# Patient Record
Sex: Female | Born: 1945 | Race: White | Hispanic: No | Marital: Married
Health system: Midwestern US, Community
[De-identification: ages and names within clinical notes are randomized; demographics above are authoritative.]

## PROBLEM LIST (undated history)

## (undated) DIAGNOSIS — Z131 Encounter for screening for diabetes mellitus: Principal | ICD-10-CM

## (undated) DIAGNOSIS — E782 Mixed hyperlipidemia: Secondary | ICD-10-CM

## (undated) DIAGNOSIS — D6859 Other primary thrombophilia: Secondary | ICD-10-CM

## (undated) DIAGNOSIS — E039 Hypothyroidism, unspecified: Secondary | ICD-10-CM

## (undated) DIAGNOSIS — R221 Localized swelling, mass and lump, neck: Principal | ICD-10-CM

## (undated) DIAGNOSIS — M810 Age-related osteoporosis without current pathological fracture: Secondary | ICD-10-CM

## (undated) DIAGNOSIS — Z1231 Encounter for screening mammogram for malignant neoplasm of breast: Secondary | ICD-10-CM

## (undated) DIAGNOSIS — R799 Abnormal finding of blood chemistry, unspecified: Secondary | ICD-10-CM

## (undated) DIAGNOSIS — R7301 Impaired fasting glucose: Secondary | ICD-10-CM

## (undated) DIAGNOSIS — Z1211 Encounter for screening for malignant neoplasm of colon: Secondary | ICD-10-CM

## (undated) DIAGNOSIS — Z9109 Other allergy status, other than to drugs and biological substances: Secondary | ICD-10-CM

## (undated) DIAGNOSIS — Z7901 Long term (current) use of anticoagulants: Secondary | ICD-10-CM

## (undated) DIAGNOSIS — Z86718 Personal history of other venous thrombosis and embolism: Secondary | ICD-10-CM

## (undated) DIAGNOSIS — D689 Coagulation defect, unspecified: Secondary | ICD-10-CM

## (undated) DIAGNOSIS — I824Y9 Acute embolism and thrombosis of unspecified deep veins of unspecified proximal lower extremity: Secondary | ICD-10-CM

## (undated) DIAGNOSIS — R7989 Other specified abnormal findings of blood chemistry: Secondary | ICD-10-CM

## (undated) DIAGNOSIS — M8588 Other specified disorders of bone density and structure, other site: Secondary | ICD-10-CM

## (undated) DIAGNOSIS — E785 Hyperlipidemia, unspecified: Secondary | ICD-10-CM

## (undated) DIAGNOSIS — Q782 Osteopetrosis: Secondary | ICD-10-CM

## (undated) DIAGNOSIS — Z79899 Other long term (current) drug therapy: Secondary | ICD-10-CM

## (undated) DIAGNOSIS — I82409 Acute embolism and thrombosis of unspecified deep veins of unspecified lower extremity: Secondary | ICD-10-CM

## (undated) DIAGNOSIS — M1990 Unspecified osteoarthritis, unspecified site: Secondary | ICD-10-CM

## (undated) DIAGNOSIS — K589 Irritable bowel syndrome without diarrhea: Secondary | ICD-10-CM

## (undated) DIAGNOSIS — E119 Type 2 diabetes mellitus without complications: Secondary | ICD-10-CM

## (undated) DIAGNOSIS — C801 Malignant (primary) neoplasm, unspecified: Secondary | ICD-10-CM

## (undated) DIAGNOSIS — I10 Essential (primary) hypertension: Secondary | ICD-10-CM

## (undated) HISTORY — PX: ABDOMINAL HYSTERECTOMY: SHX81

## (undated) HISTORY — PX: CHOLECYSTECTOMY: SHX55

## (undated) HISTORY — PX: KNEE SURGERY: SHX244

---

## 2005-07-28 ENCOUNTER — Ambulatory Visit: Payer: Self-pay | Admitting: Internal Medicine

## 2005-08-11 ENCOUNTER — Ambulatory Visit: Payer: Self-pay | Admitting: Gastroenterology

## 2005-08-25 ENCOUNTER — Ambulatory Visit: Payer: Self-pay | Admitting: Gastroenterology

## 2006-08-07 ENCOUNTER — Ambulatory Visit: Payer: Self-pay | Admitting: Internal Medicine

## 2007-08-10 ENCOUNTER — Ambulatory Visit: Payer: Self-pay | Admitting: Internal Medicine

## 2008-03-01 ENCOUNTER — Ambulatory Visit: Payer: Self-pay

## 2008-03-20 ENCOUNTER — Ambulatory Visit: Payer: Self-pay | Admitting: Orthopaedic Surgery

## 2008-09-03 ENCOUNTER — Ambulatory Visit: Payer: Self-pay | Admitting: Unknown Physician Specialty

## 2008-10-07 ENCOUNTER — Ambulatory Visit: Payer: Self-pay | Admitting: Unknown Physician Specialty

## 2009-09-04 ENCOUNTER — Ambulatory Visit: Payer: Self-pay | Admitting: Internal Medicine

## 2010-09-20 ENCOUNTER — Ambulatory Visit: Payer: Self-pay | Admitting: Internal Medicine

## 2010-10-05 ENCOUNTER — Ambulatory Visit: Payer: Self-pay | Admitting: Internal Medicine

## 2011-03-21 ENCOUNTER — Ambulatory Visit: Payer: Self-pay | Admitting: Internal Medicine

## 2012-03-27 ENCOUNTER — Ambulatory Visit: Payer: Self-pay | Admitting: Internal Medicine

## 2012-08-28 LAB — HM MAMMOGRAPHY: Mammography, External: NORMAL

## 2012-12-17 ENCOUNTER — Encounter

## 2012-12-19 LAB — PROTHROMBIN TIME + INR
INR: 3.3 — ABNORMAL HIGH (ref 0.8–1.2)
Prothrombin time: 34.6 s — ABNORMAL HIGH (ref 9.1–12.0)

## 2012-12-19 NOTE — Telephone Encounter (Signed)
Pt takes 2.5mg  on Wednesdays and Thursday and 5 mg all other days. Pt is aware of results and made apt

## 2012-12-19 NOTE — Telephone Encounter (Signed)
Message copied by Barnett Hatter on Wed Dec 19, 2012  4:42 PM  ------       Message from: Junius Roads L       Created: Wed Dec 19, 2012  4:40 PM         Ask this pt how much coumadin she is currently taking.  Continue current dose and recheck in 2 weeks.  ------

## 2012-12-19 NOTE — Progress Notes (Signed)
Quick Note:    Ask this pt how much coumadin she is currently taking. Continue current dose and recheck in 2 weeks.  ______

## 2012-12-31 ENCOUNTER — Encounter

## 2013-01-02 LAB — PROTHROMBIN TIME + INR
INR: 2.4 — ABNORMAL HIGH (ref 0.8–1.2)
Prothrombin time: 25.4 s — ABNORMAL HIGH (ref 9.1–12.0)

## 2013-01-02 NOTE — Progress Notes (Signed)
Quick Note:    Continue current dose and recheck in 2 weeks. (what is her current dose?)  ______

## 2013-01-03 NOTE — Progress Notes (Signed)
Quick Note:    Takes 5 mg except Wednesday and Thursday takes 2.5mg . Pt is aware  ______

## 2013-01-12 ENCOUNTER — Emergency Department: Payer: Self-pay | Admitting: Emergency Medicine

## 2013-01-12 LAB — BASIC METABOLIC PANEL
Anion Gap: 4 — ABNORMAL LOW (ref 7–16)
BUN: 13 mg/dL (ref 7–18)
Co2: 30 mmol/L (ref 21–32)
Creatinine: 0.56 mg/dL — ABNORMAL LOW (ref 0.60–1.30)
EGFR (African American): >60
EGFR (Non-African Amer.): >60
Osmolality: 279 (ref 275–301)
Potassium: 3.8 mmol/L (ref 3.5–5.1)

## 2013-01-12 LAB — CK TOTAL AND CKMB (NOT AT ARMC)
CK, Total: 63 U/L (ref 21–215)
CK-MB: <0.5 ng/mL — ABNORMAL LOW (ref 0.5–3.6)

## 2013-01-12 LAB — CBC
HCT: 42.4 % (ref 35.0–47.0)
HGB: 14.1 g/dL (ref 12.0–16.0)
MCHC: 33.2 g/dL (ref 32.0–36.0)
MCV: 92 fL (ref 80–100)
Platelet: 186 10*3/uL (ref 150–440)
WBC: 4.7 10*3/uL (ref 3.6–11.0)

## 2013-01-14 ENCOUNTER — Encounter

## 2013-01-16 LAB — PROTHROMBIN TIME + INR
INR: 2.4 — ABNORMAL HIGH (ref 0.8–1.2)
Prothrombin time: 25.2 s — ABNORMAL HIGH (ref 9.1–12.0)

## 2013-01-16 NOTE — Progress Notes (Signed)
Quick Note:    inr is 2.4. Recheck in 1 month.  ______

## 2013-01-17 NOTE — Progress Notes (Signed)
Quick Note:    Pt is aware and apt made  ______

## 2013-01-17 NOTE — Progress Notes (Signed)
Quick Note:    Pt aware and apt made  ______

## 2013-01-29 ENCOUNTER — Encounter

## 2013-02-12 ENCOUNTER — Encounter

## 2013-02-13 LAB — PT/INR EXTERNAL: INR, External: 2.8 (ref 2–3)

## 2013-02-14 LAB — PROTHROMBIN TIME + INR
INR: 2.8 — ABNORMAL HIGH (ref 0.8–1.2)
Prothrombin time: 29.8 s — ABNORMAL HIGH (ref 9.1–12.0)

## 2013-02-14 NOTE — Progress Notes (Signed)
Pt is aware to continue same dose and recheck in 1 month

## 2013-02-14 NOTE — Progress Notes (Signed)
Quick Note:    Pt aware to continue same dosage and recheck in a month  ______

## 2013-03-11 ENCOUNTER — Encounter

## 2013-03-19 LAB — PROTHROMBIN TIME + INR
INR: 1.6 — ABNORMAL HIGH (ref 0.8–1.2)
Prothrombin time: 17.4 s — ABNORMAL HIGH (ref 9.1–12.0)

## 2013-03-22 NOTE — Progress Notes (Signed)
Quick Note:    Pt aware to take 2.5 mg on Wed and 5mg  all other days recheck in 2 weeks  ______

## 2013-03-29 ENCOUNTER — Ambulatory Visit: Payer: Self-pay | Admitting: Internal Medicine

## 2013-03-29 ENCOUNTER — Encounter

## 2013-04-02 ENCOUNTER — Encounter

## 2013-04-02 LAB — PROTHROMBIN TIME + INR
INR: 2.2 — ABNORMAL HIGH (ref 0.8–1.2)
Prothrombin time: 23.2 s — ABNORMAL HIGH (ref 9.1–12.0)

## 2013-04-02 NOTE — Progress Notes (Signed)
Quick Note:    Pt aware  ______

## 2013-04-02 NOTE — Progress Notes (Signed)
Quick Note:    INR is good at 2.2. Recheck in 1 month.  ______

## 2013-04-02 NOTE — Progress Notes (Signed)
Quick Note:    No answer  ______

## 2013-04-30 LAB — PROTHROMBIN TIME + INR
INR: 3.7 — ABNORMAL HIGH (ref 0.8–1.2)
Prothrombin time: 38.1 s — ABNORMAL HIGH (ref 9.1–12.0)

## 2013-04-30 NOTE — Progress Notes (Signed)
Quick Note:    Pt aware and apt is aware  ______

## 2013-04-30 NOTE — Progress Notes (Signed)
Quick Note:    INR is high at 3.7. Hold Coumadin for 2 days then restart regular dose and recheck in 2 weeks.  ______

## 2013-04-30 NOTE — Progress Notes (Signed)
Quick Note:    No additional comment  ______

## 2013-05-02 ENCOUNTER — Encounter

## 2013-05-09 ENCOUNTER — Encounter

## 2013-05-09 NOTE — Progress Notes (Signed)
[facility]  Nai Borromeo L. Britta Mccreedy, M.D.  [address]  [place], [place] [address]  Ph No:  [phone]  Fax:  [phone]    CHIEF COMPLAINT:  Chief Complaint   Patient presents with   ??? Blood Clot   ??? Cholesterol Problem   ??? Hypertension          HISTORY OF PRESENT ILLNESS:  Tonya Shaffer is a 67 y.o. female presents today for follow-up and refills of her medications.  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  She is anxious to come off of the medication.  She has difficulty maintaining a therapeutic level.  All of her clots have occurred after surgical procedures.  She has had 3 in the past.  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  She has no current symptoms of blood clot.  She exercises regularly.  She denies chest pain or shortness of breath.  She denies leg pain.  She does have a history of asthma and uses her inhalers as needed.  She has rare occasions to use medication.  She does not smoke.  She also has elevated cholesterol and is currently on Lipitor.  No side effects of his medications.  Her levels have not been checked in several months.  She has history of osteoporosis.  She takes Fosamax once a week and has done this for over 2 years.  She has not had a follow-up bone density test.  She otherwise has no other complaints today.      HISTORY:  Allergies   Allergen Reactions   ??? Codeine Unable to Obtain     Past Medical History   Diagnosis Date   ??? Allergic rhinitis, cause unspecified 11/27/2012   ??? Contact dermatitis and other eczema, due to unspecified cause 11/27/2012   ??? Long term (current) use of anticoagulants 11/27/2012   ??? Encounter for long-term (current) use of other medications 11/27/2012   ??? Nonspecific abnormal electrocardiogram (ECG) (EKG) 11/27/2012   ??? 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 Hx gyn       Removed tubes   ??? Pr  abdomen surgery proc unlisted     ??? Hx cholecystectomy     ??? Pr vascular surgery procedure unlist       Left leg striped the vein     Family History   Problem Relation Age of Onset   ??? Diabetes Mother    ??? Cancer Father    ??? Asthma Father    ??? Heart Disease Father    ??? Diabetes Sister    ??? Cancer Brother      History     Social History   ??? Marital Status: MARRIED     Spouse Name: N/A     Number of Children: N/A   ??? Years of Education: N/A     Occupational History   ??? Not on file.     Social History Main Topics   ??? Smoking status: Never Smoker    ??? Smokeless tobacco: Never Used   ??? Alcohol Use: No   ??? Drug Use: No   ??? Sexually Active: Not on file     Other Topics Concern   ??? Not on file     Social History Narrative   ??? No narrative on file  Current Outpatient Prescriptions   Medication Sig Dispense Refill   ??? nitroglycerin (NITROSTAT) 0.4 mg SL tablet by SubLINGual route every five (5) minutes as needed for Chest Pain.       ??? warfarin (COUMADIN) 5 mg tablet Take 1 Tab by mouth daily.  30 Tab  5   ??? atorvastatin (LIPITOR) 40 mg tablet Take 1 Tab by mouth daily.  30 Tab  5   ??? [START ON 05/13/2013] alendronate (FOSAMAX) 70 mg tablet Take 1 Tab by mouth every Monday.  4 Tab  5   ??? montelukast (SINGULAIR) 10 mg tablet Take 1 Tab by mouth daily.  1 Tab  5   ??? albuterol (PROVENTIL HFA, VENTOLIN HFA) 90 mcg/actuation inhaler Take 2 Puffs by inhalation every four (4) hours as needed for Wheezing (FOR SOB).  1 Inhaler  5   ??? calcium-vitamin D (OYSTER SHELL CALCIUM-VIT D3) 250-125 mg-unit tablet Take 1 Tab by mouth daily.             REVIEW OF SYSTEMS:  Review of systems is as indicated in HPI otherwise negative.    PHYSICAL EXAM:  Vital Signs - BP 129/78   Pulse 75   Temp(Src) 98 ??F (36.7 ??C) (Oral)   Ht 5' 0.5" (1.537 m)   Wt 141 lb (63.957 kg)   BMI 27.07 kg/m2   SpO2 98%   Physical Exam   Constitutional: She is oriented to person, place, and time. She appears well-developed and well-nourished.   HENT:   Head:  Normocephalic and atraumatic.   Right Ear: External ear normal.   Left Ear: External ear normal.   Nose: Nose normal.   Mouth/Throat: Oropharynx is clear and moist.   Eyes: Conjunctivae and EOM are normal. Pupils are equal, round, and reactive to light.   Neck: Normal range of motion. Neck supple.   Cardiovascular: Normal rate, regular rhythm, normal heart sounds and intact distal pulses.    Pulmonary/Chest: Effort normal and breath sounds normal.   Abdominal: Soft. Bowel sounds are normal.   Musculoskeletal: Normal range of motion.   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  Otherwise full range of motion.   Neurological: She is alert and oriented to person, place, and time.   Skin: Skin is warm and dry.   Psychiatric: She has a normal mood and affect. Her behavior is normal. Judgment and thought content normal.              LABS  Results for orders placed in visit on 04/29/13   PROTHROMBIN TIME + INR       Result Value Range    INR 3.7 (*) 0.8 - 1.2    Prothrombin time 38.1 (*) 9.1 - 12.0 sec           IMPRESSION/PLAN      ICD-9-CM    1. Other and unspecified hyperlipidemia 272.4 nitroglycerin (NITROSTAT) 0.4 mg SL tablet     CBC WITH AUTOMATED DIFF     METABOLIC PANEL, COMPREHENSIVE     LIPID PANEL WITH LDL/HDL RATIO     THYROID CASCADE PROFILE   2. Encounter for long-term (current) use of other medications V58.69 nitroglycerin (NITROSTAT) 0.4 mg SL tablet     CBC WITH AUTOMATED DIFF     METABOLIC PANEL, COMPREHENSIVE     LIPID PANEL WITH LDL/HDL RATIO     THYROID CASCADE PROFILE   3. Long term (current) use of anticoagulants V58.61 nitroglycerin (NITROSTAT) 0.4 mg SL tablet  CBC WITH AUTOMATED DIFF     METABOLIC PANEL, COMPREHENSIVE     LIPID PANEL WITH LDL/HDL RATIO     THYROID CASCADE PROFILE   4. 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 Environmental allergies V15.09    6. Osteoporosis 733.00 DEXA BONE DENSITY STUDY AXIAL       We will reassess her  clotting factors.  Also consider referral to hematology.  I did advise that we may be able to discontinue Coumadin and perhaps switch her to xarelto.  Refer to hematology depending on her lab tests.  Also we will send her for follow-up bone density test.  She did discuss possibly starting infusion therapy rather than continued use of Fosamax.  Follow-up depending on her bone density test.  Otherwise she will follow-up here in 3 months.  We will check her labs today.    Jamir Rone Atilano Ina, MD            Dictated using voice recognition software. Proofread, but unrecognized voice recognition errors may exist.

## 2013-05-09 NOTE — Addendum Note (Signed)
Addended by: Evelena Leyden on: 05/09/2013 01:36 PM     Modules accepted: Orders

## 2013-05-14 ENCOUNTER — Encounter

## 2013-05-17 LAB — METABOLIC PANEL, COMPREHENSIVE
A-G Ratio: 1.9 (ref 1.1–2.5)
ALT (SGPT): 6 IU/L (ref 0–32)
AST (SGOT): 10 IU/L (ref 0–40)
Albumin: 4.5 g/dL (ref 3.6–4.8)
Alk. phosphatase: 44 IU/L (ref 39–117)
BUN/Creatinine ratio: 11 (ref 11–26)
BUN: 9 mg/dL (ref 8–27)
Bilirubin, total: 0.3 mg/dL (ref 0.0–1.2)
CO2: 22 mmol/L (ref 18–29)
Calcium: 9.5 mg/dL (ref 8.6–10.2)
Chloride: 102 mmol/L (ref 97–108)
Creatinine: 0.8 mg/dL (ref 0.57–1.00)
GFR est AA: 88 mL/min/{1.73_m2} (ref >59)
GFR est non-AA: 77 mL/min/{1.73_m2} (ref >59)
GLOBULIN, TOTAL: 2.4 g/dL (ref 1.5–4.5)
Glucose: 83 mg/dL (ref 65–99)
Potassium: 4.8 mmol/L (ref 3.5–5.2)
Protein, total: 6.9 g/dL (ref 6.0–8.5)
Sodium: 142 mmol/L (ref 134–144)

## 2013-05-17 LAB — PROTHROMBIN TIME + INR
INR: 1.8 — ABNORMAL HIGH (ref 0.8–1.2)
Prothrombin time: 18.8 s — ABNORMAL HIGH (ref 9.1–12.0)

## 2013-05-17 LAB — CBC WITH AUTOMATED DIFF
ABS. BASOPHILS: 0.1 10*3/uL (ref 0.0–0.2)
ABS. EOSINOPHILS: 0.1 10*3/uL (ref 0.0–0.4)
ABS. IMM. GRANS.: 0 10*3/uL (ref 0.0–0.1)
ABS. MONOCYTES: 0.3 10*3/uL (ref 0.1–0.9)
ABS. NEUTROPHILS: 0.7 10*3/uL — ABNORMAL LOW (ref 1.4–7.0)
Abs Lymphocytes: 1.6 10*3/uL (ref 0.7–3.1)
BASOPHILS: 3 % (ref 0–3)
EOSINOPHILS: 2 % (ref 0–5)
HCT: 39.4 % (ref 34.0–46.6)
HGB: 13.3 g/dL (ref 11.1–15.9)
IMMATURE GRANULOCYTES: 0 % (ref 0–2)
Lymphocytes: 58 % — ABNORMAL HIGH (ref 14–46)
MCH: 31 pg (ref 26.6–33.0)
MCHC: 33.8 g/dL (ref 31.5–35.7)
MCV: 92 fL (ref 79–97)
MONOCYTES: 11 % (ref 4–12)
NEUTROPHILS: 26 % — ABNORMAL LOW (ref 40–74)
PLATELET: 320 10*3/uL (ref 150–379)
RBC: 4.29 x10E6/uL (ref 3.77–5.28)
RDW: 13.8 % (ref 12.3–15.4)
WBC: 2.8 10*3/uL — ABNORMAL LOW (ref 3.4–10.8)

## 2013-05-17 LAB — LIPID PANEL WITH LDL/HDL RATIO
Cholesterol, total: 232 mg/dL — ABNORMAL HIGH (ref 100–199)
HDL Cholesterol: 72 mg/dL (ref >39)
LDL, calculated: 142 mg/dL — ABNORMAL HIGH (ref 0–99)
LDL/HDL Ratio: 2 ratio units (ref 0.0–3.2)
Triglyceride: 88 mg/dL (ref 0–149)
VLDL, calculated: 18 mg/dL (ref 5–40)

## 2013-05-17 LAB — CRP, HIGH SENSITIVITY: C-Reactive Protein, Cardiac: 1.57 mg/L (ref 0.00–3.00)

## 2013-05-17 LAB — THYROID CASCADE PROFILE: TSH: 3.1 u[IU]/mL (ref 0.450–4.500)

## 2013-05-20 ENCOUNTER — Encounter

## 2013-05-22 ENCOUNTER — Encounter

## 2013-05-22 NOTE — Progress Notes (Signed)
Quick Note:    Pt aware  ______

## 2013-05-22 NOTE — Progress Notes (Signed)
Quick Note:    Her white blood cell count is little low at 2.8. Recheck this in 2 weeks. We are still waiting on her protein C and protein S level. Her cholesterol is slightly elevated but her HDL cholesterol is good at 72. Recheck this and 6 months. Her INR is 1.8. Recheck this in 2 weeks.  ______

## 2013-05-28 LAB — PROTEIN S AG,FREE + TOTAL
Protein S, Free: 58 % (ref 56–124)
Protein S, Total: 64 % (ref 58–150)

## 2013-05-28 LAB — PROTEIN C, TOTAL: Protein C Antigen: 62 % — ABNORMAL LOW (ref 70–140)

## 2013-05-29 NOTE — Progress Notes (Signed)
Quick Note:    Informed patient also that her bone mass density test is positive for osteoporosis. Her T score is -2.6 and it should be greater than -2.5. She should continue Fosamax and calcium and vitamin D.  ______

## 2013-05-29 NOTE — Progress Notes (Signed)
Quick Note:    Pt aware  ______

## 2013-05-29 NOTE — Progress Notes (Signed)
Quick Note:    Pt is aware and we will recheck her INR next week before we start her on new medication  ______

## 2013-05-29 NOTE — Progress Notes (Signed)
Quick Note:    Pt aware. Wishes to start new medication  ______

## 2013-05-29 NOTE — Progress Notes (Signed)
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 If she wishes, we can discontinue Coumadin and start her on Xarelto and she would no longer need to monitor her PT and INR.  ______

## 2013-06-06 ENCOUNTER — Encounter

## 2013-06-06 LAB — CBC WITH AUTOMATED DIFF
ABS. BASOPHILS: 0.1 10*3/uL (ref 0.0–0.2)
ABS. EOSINOPHILS: 0.1 10*3/uL (ref 0.0–0.4)
ABS. IMM. GRANS.: 0 10*3/uL (ref 0.0–0.1)
ABS. MONOCYTES: 0.3 10*3/uL (ref 0.1–0.9)
ABS. NEUTROPHILS: 1 10*3/uL — ABNORMAL LOW (ref 1.4–7.0)
Abs Lymphocytes: 1.7 10*3/uL (ref 0.7–3.1)
BASOPHILS: 2 % (ref 0–3)
EOSINOPHILS: 3 % (ref 0–5)
HCT: 38.7 % (ref 34.0–46.6)
HGB: 13.1 g/dL (ref 11.1–15.9)
IMMATURE GRANULOCYTES: 0 % (ref 0–2)
Lymphocytes: 53 % — ABNORMAL HIGH (ref 14–46)
MCH: 31.4 pg (ref 26.6–33.0)
MCHC: 33.9 g/dL (ref 31.5–35.7)
MCV: 93 fL (ref 79–97)
MONOCYTES: 9 % (ref 4–12)
NEUTROPHILS: 33 % — ABNORMAL LOW (ref 40–74)
PLATELET: 339 10*3/uL (ref 150–379)
RBC: 4.17 x10E6/uL (ref 3.77–5.28)
RDW: 13.9 % (ref 12.3–15.4)
WBC: 3.1 10*3/uL — ABNORMAL LOW (ref 3.4–10.8)

## 2013-06-06 LAB — PROTHROMBIN TIME + INR
INR: 1.3 — ABNORMAL HIGH (ref 0.8–1.2)
Prothrombin time: 13.8 s — ABNORMAL HIGH (ref 9.1–12.0)

## 2013-06-06 NOTE — Progress Notes (Signed)
Quick Note:    Pt was taking 5 mg every day except Wed and Thursday she was not taking anything. Increased to 5 mg every day except Thursday and will recheck in 1 week  ______

## 2013-06-06 NOTE — Progress Notes (Signed)
Quick Note:    Her INR is 1.3. What is her current dose of Coumadin? It needs to be increased by 5 mg per week and recheck in 1 week. Once INR is between 2 and 3, switch to Xarelto.  ______

## 2013-06-12 ENCOUNTER — Encounter

## 2013-06-14 LAB — PROTHROMBIN TIME + INR
INR: 1.8 — ABNORMAL HIGH (ref 0.8–1.2)
Prothrombin time: 19 s — ABNORMAL HIGH (ref 9.1–12.0)

## 2013-06-17 ENCOUNTER — Encounter

## 2013-06-17 NOTE — Progress Notes (Signed)
Quick Note:    Pt aware and increased to 5 mg everyday reck on 8/4  ______

## 2013-06-17 NOTE — Progress Notes (Signed)
Quick Note:    INR is 1.8 increased Coumadin by 5 milligrams per week and recheck in 1 week.  ______

## 2013-06-18 NOTE — Progress Notes (Signed)
Ambulatory/Rehab Services H2 Model Falls Risk Assessment    Risk Factor Pts. ??   Confusion/Disorientation/Impulsivity  []    4 ??   Symptomatic Depression  []   2 ??   Altered Elimination  []   1 ??   Dizziness/Vertigo  []   1 ??   Gender (Female)  []   1 ??   Any administered antiepileptics (anticonvulsants):  []   2 ??   Any administered benzodiazepines:  []   1 ??   Visual Impairment (specify):  []   1 ??   Portable Oxygen Use  []   1 ??   Orthostatic ? BP  []   1 ??   History of Recent Falls (within 3 mos.)  []   5     Ability to Rise from Chair (choose one) Pts. ??   Ability to rise in a single movement  [x]   0 ??   Pushes up, successful in one attempt  []   1 ??   Multiple attempts, but successful  []   3 ??   Unable to rise without assistance  []   4   Total: (5 or greater = High Risk) 0     Falls Prevention Plan:   []                Physical Limitations to Exercise (specify):   []                Mobility Assistance Device (type):   []                Exercise/Equipment Adaptation (specify):    ??2010 [employer] All Rights Reserved. [place] Patent #7,282,031. Federal Law prohibits the replication, distribution or use without written permission from [employer]

## 2013-06-18 NOTE — Progress Notes (Addendum)
Therapy Center at [facility] [address]  [address] [place], [place] [address]  Phone: [phone]   Fax: [phone]  Outpatient PHYSICAL THERAPY: Initial Assessment and Discharge  Fall Risk Score: 0 (? 5 = High Risk)    Treatment Diagnosis: Other disorders of muscle ligament and fascia  REFERRING PHYSICIAN: Barksdale, Thom Chimes*  MD Orders: Bone safety evaluation  Return Physician Appointment: unknown   MEDICAL/REFERRING DIAGNOSIS: Osteoporosis [733.00]  Other physical therapy [V57.1]  DATE OF ONSET: 05/14/2013   PRIOR LEVEL OF FUNCTION: Independent   PRECAUTIONS/ALLERGIES: Allergic to codeine   ASSESSMENT:  ????????This section established at most recent assessment??????????  PROBLEM LIST (Impairments causing functional limitations):  1. No problems stated  GOALS: (Goals have been discussed and agreed upon with patient.)  SHORT-TERM FUNCTIONAL GOALS: Time Frame:   1. No short term goals stated  DISCHARGE GOALS: Time Frame:   1. No discharge goals stated   PLAN OF CARE:  INTERVENTIONS PLANNED: (Benefits and precautions of physical therapy have been discussed with the patient.)  1. No interventions stated  TREATMENT PLAN EFFECTIVE DATES: 06/18/2013  FREQUENCY/DURATION: One time appointment for bone safety evaluation, home exercise program, and education.  Patient discharged from physical therapy.  Regarding Tonya Shaffer's therapy, I certify that the treatment plan above will be carried out by a therapist or under their direction.  Thank you for this referral,  Gregorio Worley Gevena Barre, PT     Referring Physician Signature: Domingo Cocking*          Date                       SUBJECTIVE:  History of Present Injury/Illness (Reason for Referral): Patient referred for bone safety evaluation due to osteoporosis  Present Symptoms: N/A   Pain Intensity 1: 0  Dominant Side:   Past Medical History:   Past Medical History   Diagnosis Date   ??? Allergic rhinitis, cause  unspecified 11/27/2012   ??? Contact dermatitis and other eczema, due to unspecified cause 11/27/2012   ??? Long term (current) use of anticoagulants 11/27/2012   ??? Encounter for long-term (current) use of other medications 11/27/2012   ??? Nonspecific abnormal electrocardiogram (ECG) (EKG) 11/27/2012   ??? 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 Osteoporosis      Past Surgical History   Procedure Laterality Date   ??? Hx gyn       Removed tubes   ??? Pr abdomen surgery proc unlisted     ??? Hx cholecystectomy     ??? Vascular surgery procedure unlist       Left leg striped the vein     Current Medications: Nitrostat, coumadin, lipitor, fosamax, singulair, proventil HFA, calcium/vitamin D   Date Last Reviewed: 06/18/2013  Social History/Home Situation: Lives with spouse      Work/Activity History: Retired  OBJECTIVE:  Patient is a 67 year old white female referred for a bone safety evaluation due to osteoporosis.  Bone safety evaluation performed and results discussed with patient.  Patient within normal limits for balance, strength, and flexibility.  Importance of proper body mechanics with daily activities stressed with patient to help reduce risk of fracture.  Patient given home exercise program with scapular strengthening and core strengthening exercises.  Patient feels comfortable continuing exercises independently.  No problems or goals stated.  Patient discharged from physical therapy.  Outcome Measure:   Tool Used: Sharlene Motts Balance Scale  Score:  Initial: 52/56 Most Recent: X/56 (Date: -- )   Interpretation of Score: Each section is scored on a 0-4 scale, 0 representing the patient???s inability to perform the task and 4 representing independence.  The scores of each section are added together for a total score of 56.  The higher the patient???s score, the more independent the patient is.  Any score below 45 indicates increased risk for falls.    Score 56 55-45 44-34 33-23 22-12 11-1 0   Modifier CH CI CJ CK CL CM  CN       Mobility - Walking and Moving Around:    Z6109 - CURRENT STATUS: CI - 1%-19% impaired, limited or restricted   U0454 - GOAL STATUS:  CI - 1%-19% impaired, limited or restricted   U9811 - D/C STATUS:  CI - 1%-19% impaired, limited or restricted    Postural curves is as follows: sacral eighteen degrees, thoracic forty-six degrees, and cervical sixty degrees.  TREATMENT:    (In addition to Assessment/Re-Assessment sessions the following treatments were rendered)  Initial Evaluation: 45 minutes    Manual Therapy (     ):   Therapeutic Modalities:                                                                                               HEP: As above; handouts given to patient for all exercises.  ______________________________________________________________________________________________________    Treatment Assessment:  Initial evaluation today.  Patient tolerated bone safety evaluation without complaints.  Recommendations/Intent for next treatment session: Patient discharged from physical therapy.  Total Treatment Duration:  PT Patient Time In/Time Out  Time In: 1300  Time Out: 1345    Tonya Shaffer, PT

## 2013-06-18 NOTE — Nurse Consult (Signed)
Outpatient Rehab at [facility] [address]  [address] [place], [place] [address]  Phone: [phone]   Fax: [phone]  BONE HEALTH NURSING: Initial Assessment    Tonya Shaffer is a 67 y.o. female   DOB: [birth year]  REFERRING PHYSICIAN: Junius Roads, MD  MEDICAL/REFERRING DIAGNOSIS: osteoporosis  DATE OF ONSET: per 05/14/13 DEXA scan   PRIOR LEVEL OF FUNCTION: independent  PRECAUTIONS/ALLERGIES: codeine  SUBJECTIVE:  History of Present Injury/Illness (Reason for Referral): osteoporosis per 05/14/13 DEXA scan  Present Symptoms: patient denies any pain; is very active - bowls 3 times per week, walks regularly  Dominant Side: right  Past Medical History: Tonya Shaffer  has a past medical history of Allergic rhinitis, cause unspecified (11/27/2012); Contact dermatitis and other eczema, due to unspecified cause (11/27/2012); Long term (current) use of anticoagulants (11/27/2012); Encounter for long-term (current) use of other medications (11/27/2012); Nonspecific abnormal electrocardiogram (ECG) (EKG) (11/27/2012); Other and unspecified coagulation defects (11/27/2012); and Osteoporosis.  She also  has past surgical history that includes gyn; abdomen surgery proc unlisted; cholecystectomy; and vascular surgery procedure unlist.   Current Medications:    Current Outpatient Prescriptions on File Prior to Encounter   Medication Sig Dispense Refill   ??? nitroglycerin (NITROSTAT) 0.4 mg SL tablet by SubLINGual route every five (5) minutes as needed for Chest Pain.       ??? warfarin (COUMADIN) 5 mg tablet Take 1 Tab by mouth daily.  30 Tab  5   ??? atorvastatin (LIPITOR) 40 mg tablet Take 1 Tab by mouth daily.  30 Tab  5   ??? alendronate (FOSAMAX) 70 mg tablet Take 1 Tab by mouth every Monday.  4 Tab  5   ??? montelukast (SINGULAIR) 10 mg tablet Take 1 Tab by mouth daily.  1 Tab  5   ??? albuterol (PROVENTIL HFA, VENTOLIN HFA) 90 mcg/actuation inhaler Take 2 Puffs by inhalation every four (4) hours as  needed for Wheezing (FOR SOB).  1 Inhaler  5   ??? calcium-vitamin D (OYSTER SHELL CALCIUM-VIT D3) 250-125 mg-unit tablet Take 1 Tab by mouth daily.         No current facility-administered medications on file prior to encounter.     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 Patient and nurse reviewed patient's current medication list. She takes Fosamax and calcium with vitamin D. Patient educated regarding best method for taking medication for maximum absorption and benefit. Bone health screening??and risk factors assessed with patient.  Patient expressed interest in program(s) listed below. Patient was given contact information and asked to call with any questions.    PLAN OF CARE:Consults:    [x]    Yoga     [x]    Nutrition     Patient expressed an interest in massage as well.      Thank you for this referral,  Edwyna Shell, RN  06/18/2013

## 2013-06-28 ENCOUNTER — Encounter

## 2013-07-02 LAB — PROTHROMBIN TIME + INR
INR: 4.3 — ABNORMAL HIGH (ref 0.8–1.2)
Prothrombin time: 44.9 s — ABNORMAL HIGH (ref 9.1–12.0)

## 2013-07-08 ENCOUNTER — Encounter

## 2013-07-08 NOTE — Progress Notes (Signed)
Quick Note:    INR is very high at 4.3. Hold Coumadin and recheck level in 3 days. If normal start Xarelto.  ______

## 2013-07-08 NOTE — Progress Notes (Signed)
Quick Note:    Pt aware to hold for 2 days and reck on Wednesday  ______

## 2013-07-09 ENCOUNTER — Encounter

## 2013-07-11 LAB — PROTHROMBIN TIME + INR
INR: 1.8 — ABNORMAL HIGH (ref 0.8–1.2)
Prothrombin time: 18.5 s — ABNORMAL HIGH (ref 9.1–12.0)

## 2013-07-12 ENCOUNTER — Encounter

## 2013-07-12 NOTE — Progress Notes (Signed)
Quick Note:    Pt restarted 5 mg daily and will check on Monday  ______

## 2013-07-16 ENCOUNTER — Encounter

## 2013-07-16 LAB — PROTHROMBIN TIME + INR
INR: 2.4 — ABNORMAL HIGH (ref 0.8–1.2)
Prothrombin time: 24.4 s — ABNORMAL HIGH (ref 9.1–12.0)

## 2013-07-16 NOTE — Progress Notes (Signed)
Quick Note:    Pt aware labs are now stable can start xarelto  ______

## 2013-09-05 NOTE — Progress Notes (Signed)
PT IS HERE FOR FLU SHOT. NO ALLERGIES TO FLU SHOT BEFORE,EGGS,OR LATEX

## 2013-09-05 NOTE — Patient Instructions (Signed)
Vaccine Information Statement    Influenza (Flu) Vaccine (Inactivated or Recombinant): What you need to know  2014-15    Many Vaccine Information Statements are available in Spanish and other languages. See www.immunize.org/vis  Hojas de Informaci??n Sobre Vacunas est??n disponibles en Espa??ol y en muchos otros idiomas. Visite http://www.immunize.org/vis    1. Why get vaccinated?    Influenza (???flu???) is a contagious disease that spreads around the [place] every winter, usually between October and May.     Flu is caused by influenza viruses, and is spread mainly by coughing, sneezing, and close contact.     Anyone can get flu, but the risk of getting flu is highest among children. Symptoms come on suddenly and may last several days. They can include:  ??? fever/chills  ??? sore throat  ??? muscle aches  ??? fatigue  ??? cough  ??? headache   ??? runny or stuffy nose    Flu can make some people much sicker than others. These people include young children, people 65 and older, pregnant women, and people with certain health conditions ??? such as heart, lung or kidney disease, nervous system disorders, or a weakened immune system.  Flu vaccination is especially important for these people, and anyone in close contact with them.    Flu can also lead to pneumonia, and make existing medical conditions worse. It can cause diarrhea and seizures in children.     Each year thousands of people in the [place] die from flu, and many more are hospitalized.     Flu vaccine is the best protection against flu and its complications. Flu vaccine also helps prevent spreading flu from person to person.       2. Inactivated and recombinant flu vaccines    You are getting an injectable flu vaccine, which is either an ???inactivated??? or ???recombinant??? vaccine. These vaccines do not contain any live influenza virus.  They are given by injection with a needle, and often called the ???flu shot.???    A different, live, attenuated (weakened) influenza  vaccine is sprayed into the nostrils. This vaccine is described in a separate Vaccine Information Statement.    Flu vaccination is recommended every year. Some children 6 months through 8 years of age might need two doses during one year.     Flu viruses are always changing. Each year???s flu vaccine is made to protect against 3 or 4 viruses that are likely to cause disease that year. Flu vaccine cannot prevent all cases of flu, but it is the best defense against the disease.     It takes about 2 weeks for protection to develop after the vaccination, and protection lasts several months to a year.     Some illnesses that are not caused by influenza virus are often mistaken for flu. Flu vaccine will not prevent these illnesses. It can only prevent influenza.    Some inactivated flu vaccine contains a very small amount of a mercury-based preservative called thimerosal. Studies have shown that thimerosal in vaccines is not harmful, but flu vaccines that do not contain a preservative are available.      3. Some people should not get this vaccine    Tell the person who gives you the vaccine:  ??? If you have any severe, life-threatening allergies.  If you ever had a life-threatening allergic reaction after a dose of flu vaccine, or have a severe allergy to any part of this vaccine, including (for example) an allergy to gelatin,   antibiotics, or eggs, you may be advised not to get vaccinated.  Most, but not all, types of flu vaccine contain a small amount of egg protein.     ??? If you ever had Guillain-Barr?? Syndrome (a severe paralyzing illness, also called GBS). Some people with a history of GBS should not get this vaccine. This should be discussed with your doctor.  ??? If you are not feeling well.  It is usually okay to get flu vaccine when you have a mild illness, but you might be advised to wait until you feel better.  You should come back when you are better.      4. Risks of a vaccine reaction    With a vaccine, like any  medicine, there is a chance of side effects. These are usually mild and go away on their own.             Problems that could happen after any vaccine:   ??? Brief fainting spells can happen after any medical procedure, including vaccination. Sitting or lying down for about 15 minutes can help prevent fainting, and injuries caused by a fall. Tell your doctor if you feel dizzy, or have vision changes or ringing in the ears.   ??? Severe shoulder pain and reduced range of motion in the arm where a shot was given can happen, very rarely, after a vaccination.   ??? Severe allergic reactions from a vaccine are very rare, estimated at less than 1 in a million doses. If one were to occur, it would usually be within a few minutes to a few hours after the vaccination.     Mild problems following inactivated flu vaccine:   ??? soreness, redness, or swelling where the shot was given    ??? hoarseness  ??? sore, red or itchy eyes  ??? cough  ??? fever  ??? aches  ??? headache  ??? itching  ??? fatigue  If these problems occur, they usually begin soon after the shot and last 1 or 2 days.     Moderate problems following inactivated flu vaccine:  ??? Young children who get inactivated flu vaccine and pneumococcal vaccine (PCV13) at the same time may be at increased risk for seizures caused by fever. Ask your doctor for more information. Tell your doctor if a child who is getting flu vaccine has ever had a seizure.    Inactivated flu vaccine does not contain live flu virus, so you cannot get the flu from this vaccine.     As with any medicine, there is a very remote chance of a vaccine causing a serious injury or death.    The safety of vaccines is always being monitored. For more information, visit: www.cdc.gov/vaccinesafety/    5. What if there is a serious reaction?    What should I look for?  ??? Look for anything that concerns you, such as signs of a severe allergic reaction, very high  fever, or behavior changes.     Signs of a severe allergic reaction  can include hives, swelling of the face and throat, difficulty   breathing, a fast heartbeat, dizziness, and weakness. These would usually start a few minutes to a few hours after the vaccination.    What should I do?  ??? If you think it is a severe allergic reaction or other emergency that can???t wait, call 9-1-1 and get the person to the nearest hospital. Otherwise, call your doctor.    ??? Afterward, the reaction should   be reported to the ???Vaccine Adverse Event Reporting    System??? (VAERS). Your doctor should file this report, or you can do it yourself through    the VAERS web site at www.vaers.hhs.gov, or by calling 1-800-822-7967.    VAERS does not give medical advice.      6. The National Vaccine Injury Compensation Program    The National Vaccine Injury Compensation Program (VICP) is a federal program that was created to compensate people who may have been injured by certain vaccines.    Persons who believe they may have been injured by a vaccine can learn about the program and about filing a claim by calling 1-800-338-2382 or visiting the VICP website at www.hrsa.gov/vaccinecompensation.  There is a time limit to file a claim for compensation.    7. How can I learn more?  ??? Ask your health care provider.  ??? Call your local or state health department.  ??? Contact the Centers for Disease Control and   Prevention (CDC):  - Call 1-800-232-4636 (1-800-CDC-INFO) or  - Visit CDC???s website at www.cdc.gov/flu      Vaccine Information Statement (Interim)  Inactivated Influenza Vaccine   07/16/2013  42 [place] ?? [id number]    Department of Health and Human Services  Centers for Disease Control and Prevention    Office Use Only

## 2014-02-24 ENCOUNTER — Telehealth

## 2014-02-24 NOTE — Telephone Encounter (Signed)
Pt request refill for xarelto. Please send to [facility] in [place].

## 2014-02-25 MED ORDER — RIVAROXABAN 20 MG TAB
20 mg | ORAL_TABLET | Freq: Every day | ORAL | Status: DC
Start: 2014-02-25 — End: 2014-09-24

## 2014-02-25 NOTE — Telephone Encounter (Signed)
rx sent in and left message on machine

## 2014-04-24 ENCOUNTER — Ambulatory Visit: Payer: Self-pay | Admitting: Internal Medicine

## 2014-05-26 MED ORDER — ATORVASTATIN 40 MG TAB
40 mg | ORAL_TABLET | Freq: Every day | ORAL | Status: DC
Start: 2014-05-26 — End: 2014-06-25

## 2014-05-26 MED ORDER — MONTELUKAST 10 MG TAB
10 mg | ORAL_TABLET | Freq: Every day | ORAL | Status: DC
Start: 2014-05-26 — End: 2014-06-25

## 2014-05-26 MED ORDER — ALENDRONATE 70 MG TAB
70 mg | ORAL_TABLET | ORAL | Status: DC
Start: 2014-05-26 — End: 2014-06-25

## 2014-05-26 MED ORDER — ALBUTEROL SULFATE HFA 90 MCG/ACTUATION AEROSOL INHALER
90 mcg/actuation | RESPIRATORY_TRACT | Status: DC | PRN
Start: 2014-05-26 — End: 2014-06-25

## 2014-05-26 NOTE — Telephone Encounter (Signed)
Pt requested refill, rx sent in, and apt made

## 2014-06-25 MED ORDER — ALBUTEROL SULFATE HFA 90 MCG/ACTUATION AEROSOL INHALER
90 mcg/actuation | RESPIRATORY_TRACT | Status: DC | PRN
Start: 2014-06-25 — End: 2015-09-10

## 2014-06-25 MED ORDER — ALENDRONATE 70 MG TAB
70 mg | ORAL_TABLET | ORAL | Status: DC
Start: 2014-06-25 — End: 2015-09-10

## 2014-06-25 MED ORDER — MONTELUKAST 10 MG TAB
10 mg | ORAL_TABLET | Freq: Every day | ORAL | Status: DC
Start: 2014-06-25 — End: 2014-09-25

## 2014-06-25 MED ORDER — ATORVASTATIN 40 MG TAB
40 mg | ORAL_TABLET | Freq: Every day | ORAL | Status: DC
Start: 2014-06-25 — End: 2014-09-25

## 2014-06-25 NOTE — Patient Instructions (Addendum)
Preventing Falls: After Your Visit  Your Care Instructions  Getting around your home safely can be a challenge if you have injuries or health problems that make it easy for you to fall. Loose rugs and furniture in walkways are among the dangers for many older people who have problems walking or who have poor eyesight. People who have conditions such as arthritis, osteoporosis, or dementia also have to be careful not to fall.  You can make your home safer with a few simple measures.  Follow-up care is a key part of your treatment and safety. Be sure to make and go to all appointments, and call your doctor if you are having problems. It's also a good idea to know your test results and keep a list of the medicines you take.  How can you care for yourself at home?  Taking care of yourself  ?? You may get dizzy if you do not drink enough water. To prevent dehydration, drink plenty of fluids, enough so that your urine is light yellow or clear like water. Choose water and other caffeine-free clear liquids. If you have kidney, heart, or liver disease and have to limit fluids, talk with your doctor before you increase the amount of fluids you drink.  ?? Exercise regularly to improve your strength, muscle tone, and balance. Walk if you can. Swimming may be a good choice if you cannot walk easily.  ?? Have your vision and hearing checked each year or any time you notice a change. If you have trouble seeing and hearing, you might not be able to avoid objects and could lose your balance.  ?? Know the side effects of the medicines you take. Ask your doctor or pharmacist whether the medicines you take can affect your balance. Sleeping pills or sedatives can affect your balance.  ?? Limit the amount of alcohol you drink. Alcohol can impair your balance and other senses.  ?? Ask your doctor whether calluses or corns on your feet need to be removed. If you wear loose-fitting shoes because of calluses or corns, you  can lose your balance and fall.  ?? Talk to your doctor if you have numbness in your feet.  Preventing falls at home  ?? Remove raised doorway thresholds, throw rugs, and clutter. Repair loose carpet or raised areas in the floor.  ?? Move furniture and electrical cords to keep them out of walking paths.  ?? Use nonskid floor wax, and wipe up spills right away, especially on ceramic tile floors.  ?? If you use a walker or cane, put rubber tips on it. If you use crutches, clean the bottoms of them regularly with an abrasive pad, such as steel wool.  ?? Keep your house well lit, especially stairways, porches, and outside walkways. Use night-lights in areas such as hallways and bathrooms. Add extra light switches or use remote switches (such as switches that go on or off when you clap your hands) to make it easier to turn lights on if you have to get up during the night.  ?? Install sturdy handrails on stairways.  ?? Move items in your cabinets so that the things you use a lot are on the lower shelves (about waist level).  ?? Keep a cordless phone and a flashlight with new batteries by your bed. If possible, put a phone in each of the main rooms of your house, or carry a cell phone in case you fall and cannot reach a phone. Or, you can wear   a device around your neck or wrist. You push a button that sends a signal for help.  ?? Wear low-heeled shoes that fit well and give your feet good support. Use footwear with nonskid soles. Check the heels and soles of your shoes for wear. Repair or replace worn heels or soles.  ?? Do not wear socks without shoes on wood floors.  ?? Walk on the grass when the sidewalks are slippery. If you live in an area that gets snow and ice in the winter, sprinkle salt on slippery steps and sidewalks.  Preventing falls in the bath  ?? Install grab bars and nonskid mats inside and outside your shower or tub and near the toilet and sinks.  ?? Use shower chairs and bath benches.   ?? Use a hand-held shower head that will allow you to sit while showering.  ?? Get into a tub or shower by putting the weaker leg in first. Get out of a tub or shower with your strong side first.  ?? Repair loose toilet seats and consider installing a raised toilet seat to make getting on and off the toilet easier.  ?? Keep your bathroom door unlocked while you are in the shower.   Where can you learn more?   Go to [email or web address]  Enter G117 in the search box to learn more about "Preventing Falls: After Your Visit."   ?? 2006-2015 [employer]. Care instructions adapted under license by [employer] (which disclaims liability or warranty for this information). This care instruction is for use with your licensed healthcare professional. If you have questions about a medical condition or this instruction, always ask your healthcare professional. [employer] Inc[employer] disclaims any warranty or liability for your use of this information.  Content Version: 10.5.422740; Current as of: October 11, 2013                Advance Directives: After Your Visit  Your Care Instructions  An advance directive is a legal way to state your wishes at the end of your life. It tells your family and your doctor what to do if you can no longer say what you want.  There are two main types of advance directives. You can change them any time that your wishes change.  ?? A living will tells your family and your doctor your wishes about life support and other treatment.  ?? A medical power of attorney lets you name a person to make treatment decisions for you when you can't speak for yourself. This person is called a health care agent.  If you do not have an advance directive, decisions about your medical care may be made by a doctor or a judge who doesn't know you.  It may help to think of an advance directive as a gift to the people who care for you. If you have one, they won't have to make tough decisions by  themselves.  Follow-up care is a key part of your treatment and safety. Be sure to make and go to all appointments, and call your doctor if you are having problems. It's also a good idea to know your test results and keep a list of the medicines you take.  How can you care for yourself at home?  ?? Discuss your wishes with your loved ones and your doctor. This way, there are no surprises.  ?? Many states have a unique form. Or you might use a universal form that has been approved by many states. This  kind of form can sometimes be completed and stored online. Your electronic copy will then be available wherever you have a connection to the Internet. In most cases, doctors will respect your wishes even if you have a form from a different state.  ?? You don't need a lawyer to do an advance directive. But you may want to get legal advice.  ?? Think about these questions when you prepare an advance directive:  ?? Who do you want to make decisions about your medical care if you are not able to? Many people choose a family member, close friend, or doctor.  ?? Do you know enough about life support methods that might be used? If not, talk to your doctor so you understand.  ?? What are you most afraid of that might happen? You might be afraid of having pain, losing your independence, or being kept alive by machines.  ?? Where would you prefer to die? Choices include your home, a hospital, or a nursing home.  ?? Would you like to have information about hospice care to support you and your family?  ?? Do you want to donate organs when you die?  ?? Do you want certain religious practices performed before you die? If so, put your wishes in the advance directive.  ?? Read your advance directive every year, and make changes as needed.  When should you call for help?  Be sure to contact your doctor if you have any questions.   Where can you learn more?   Go to [email or web address]   Enter R264 in the search box to learn more about "Advance Directives: After Your Visit."   ?? 2006-2015 [employer]. Care instructions adapted under license by [employer] (which disclaims liability or warranty for this information). This care instruction is for use with your licensed healthcare professional. If you have questions about a medical condition or this instruction, always ask your healthcare professional. [employer] disclaims any warranty or liability for your use of this information.  Content Version: 10.5.422740; Current as of: January 17, 2014

## 2014-06-25 NOTE — Progress Notes (Signed)
This is a Subsequent Medicare Annual Wellness Visit providing Personalized Prevention Plan Services (PPPS) (Performed 12 months after initial AWV and PPPS )    I have reviewed the patient's medical history in detail and updated the computerized patient record.     History     Past Medical History   Diagnosis Date   ??? Allergic rhinitis, cause unspecified 11/27/2012   ??? Contact dermatitis and other eczema, due to unspecified cause 11/27/2012   ??? Long term (current) use of anticoagulants 11/27/2012   ??? Encounter for long-term (current) use of other medications 11/27/2012   ??? Nonspecific abnormal electrocardiogram (ECG) (EKG) 11/27/2012   ??? 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 Osteoporosis       Past Surgical History   Procedure Laterality Date   ??? Hx gyn       Removed tubes   ??? Pr abdomen surgery proc unlisted     ??? Hx cholecystectomy     ??? Vascular surgery procedure unlist       Left leg striped the vein     Current Outpatient Prescriptions   Medication Sig Dispense Refill   ??? albuterol (PROVENTIL HFA, VENTOLIN HFA, PROAIR HFA) 90 mcg/actuation inhaler Take 2 Puffs by inhalation every four (4) hours as needed for Wheezing (FOR SOB). 1 Inhaler 1   ??? montelukast (SINGULAIR) 10 mg tablet Take 1 Tab by mouth daily. 1 Tab 1   ??? atorvastatin (LIPITOR) 40 mg tablet Take 1 Tab by mouth daily. 30 Tab 1   ??? alendronate (FOSAMAX) 70 mg tablet Take 1 Tab by mouth every Monday. 4 Tab 1   ??? rivaroxaban (XARELTO) 20 mg tab tablet Take 1 Tab by mouth daily (with dinner). 30 Tab 5   ??? nitroglycerin (NITROSTAT) 0.4 mg SL tablet by SubLINGual route every five (5) minutes as needed for Chest Pain.     ??? calcium-vitamin D (OYSTER SHELL CALCIUM-VIT D3) 250-125 mg-unit tablet Take 1 Tab by mouth daily.       Allergies   Allergen Reactions   ??? Codeine Unable to Obtain     Family History   Problem Relation Age of Onset   ??? Diabetes Mother    ??? Cancer Father    ??? Asthma Father    ??? Heart Disease Father     ??? Diabetes Sister    ??? Cancer Brother      History   Substance Use Topics   ??? Smoking status: Never Smoker    ??? Smokeless tobacco: Never Used   ??? Alcohol Use: No     Patient Active Problem List   Diagnosis Code   ??? Allergic rhinitis, cause unspecified 477.9   ??? Contact dermatitis and other eczema, due to unspecified cause 692.9   ??? Encounter for long-term (current) use of other medications V58.69   ??? Nonspecific abnormal electrocardiogram (ECG) (EKG) 794.31   ??? Other and unspecified hyperlipidemia 272.4   ??? DVT (deep venous thrombosis) (HCC) 453.40   ??? 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  No  Do you average more than 7 drinks per week?  No      Functional Ability and Level of Safety:     Hearing Loss   normal-to-mild    Activities of Daily Living   Self-care.   Requires assistance with: no ADLs    Fall Risk     Fall Risk Assessment, last 12 mths 06/25/2014   Able to walk? Yes   Fall in past 12 months? Yes   Fall with injury? No   Number of falls in past 12 months 2   Fall Risk Score 2     Abuse Screen   Patient is not abused    Review of Systems   A comprehensive review of systems was negative except for that written in the HPI.    Physical Examination     Evaluation of Cognitive Function:  Mood/affect:  happy  Appearance: age appropriate  Family member/caregiver input: none    BP 104/70 mmHg   Pulse 83   Temp(Src) 98.3 ??F (36.8 ??C) (Oral)   Ht 5' 0.5" (1.537 m)   Wt 150 lb (68.04 kg)   BMI 28.80 kg/m2   SpO2 94%  General:  Alert, cooperative, no distress, appears stated age.   Head:  Normocephalic, without obvious abnormality, atraumatic.    Eyes:  Conjunctivae/corneas clear. PERRL, EOMs intact. Fundi benign.   Ears:  Normal TMs and external ear canals both ears.   Nose: Nares normal. Septum midline. Mucosa normal. No drainage or sinus tenderness.   Throat: Lips, mucosa, and tongue normal. Teeth and gums normal.   Neck: Supple, symmetrical, trachea midline, no adenopathy, thyroid: no enlargement/tenderness/nodules, no carotid bruit and no JVD.   Back:   Symmetric, no curvature. ROM normal. No CVA tenderness.   Lungs:   Clear to auscultation bilaterally.   Chest wall:  No tenderness or deformity.   Heart:  Regular rate and rhythm, S1, S2 normal, no murmur, click, rub or gallop.   Breast Exam:  Not examined   Abdomen:   Soft, non-tender. Bowel sounds normal. No masses,  No organomegaly.   Genitalia:  Not examined   Rectal:  Not examined   Extremities: Extremities normal, atraumatic, no cyanosis or edema.   Pulses: 2+ and symmetric all extremities.   Skin: Skin color, texture, turgor normal. No rashes or lesions.   Lymph nodes: Cervical, supraclavicular, and axillary nodes normal.   Neurologic: CNII-XII intact. Normal strength, sensation and reflexes throughout.       Patient Care Team:  Miles Costain, MD as PCP - [facility])    Advice/Referrals/Counseling   Education and counseling provided:  Are appropriate based on today's review and evaluation    Assessment/Plan       ICD-9-CM    1. Allergic rhinitis, cause unspecified 477.9 montelukast (SINGULAIR) 10 mg tablet   2. Other and unspecified hyperlipidemia 272.4 atorvastatin (LIPITOR) 40 mg tablet   3. 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 Routine general medical examination at a health care facility V70.0    5. Screening for depression V79.0    6. Screening for alcoholism V79.1      current treatment plan is effective, no change in therapy   cardiovascular risk and specific lipid/LDL goals reviewed.

## 2014-07-01 ENCOUNTER — Encounter

## 2014-07-02 NOTE — Addendum Note (Signed)
Addended by: Barnett Hatter on: 07/02/2014 10:48 AM      Modules accepted: Orders

## 2014-07-02 NOTE — Addendum Note (Signed)
Addended by: Evelena Leyden on: 07/02/2014 11:05 AM      Modules accepted: Orders

## 2014-07-03 LAB — LIPID PANEL WITH LDL/HDL RATIO
Cholesterol, total: 173 mg/dL (ref 100–199)
HDL Cholesterol: 67 mg/dL (ref >39)
LDL, calculated: 92 mg/dL (ref 0–99)
LDL/HDL Ratio: 1.4 ratio units (ref 0.0–3.2)
Triglyceride: 68 mg/dL (ref 0–149)
VLDL, calculated: 14 mg/dL (ref 5–40)

## 2014-07-03 LAB — CBC WITH AUTOMATED DIFF
ABS. BASOPHILS: 0.1 10*3/uL (ref 0.0–0.2)
ABS. EOSINOPHILS: 0.2 10*3/uL (ref 0.0–0.4)
ABS. IMM. GRANS.: 0 10*3/uL (ref 0.0–0.1)
ABS. MONOCYTES: 0.4 10*3/uL (ref 0.1–0.9)
ABS. NEUTROPHILS: 1.3 10*3/uL — ABNORMAL LOW (ref 1.4–7.0)
Abs Lymphocytes: 1.7 10*3/uL (ref 0.7–3.1)
BASOPHILS: 1 %
EOSINOPHILS: 4 %
HCT: 38.7 % (ref 34.0–46.6)
HGB: 13.1 g/dL (ref 11.1–15.9)
IMMATURE GRANULOCYTES: 0 %
Lymphocytes: 47 %
MCH: 32.3 pg (ref 26.6–33.0)
MCHC: 33.9 g/dL (ref 31.5–35.7)
MCV: 96 fL (ref 79–97)
MONOCYTES: 11 %
NEUTROPHILS: 37 %
PLATELET: 320 10*3/uL (ref 150–379)
RBC: 4.05 x10E6/uL (ref 3.77–5.28)
RDW: 13.5 % (ref 12.3–15.4)
WBC: 3.6 10*3/uL (ref 3.4–10.8)

## 2014-07-03 LAB — METABOLIC PANEL, COMPREHENSIVE
A-G Ratio: 1.8 (ref 1.1–2.5)
ALT (SGPT): 13 IU/L (ref 0–32)
AST (SGOT): 12 IU/L (ref 0–40)
Albumin: 4.2 g/dL (ref 3.6–4.8)
Alk. phosphatase: 40 IU/L (ref 39–117)
BUN/Creatinine ratio: 18 (ref 11–26)
BUN: 12 mg/dL (ref 8–27)
Bilirubin, total: 0.5 mg/dL (ref 0.0–1.2)
CO2: 23 mmol/L (ref 18–29)
Calcium: 8.9 mg/dL (ref 8.7–10.3)
Chloride: 102 mmol/L (ref 97–108)
Creatinine: 0.66 mg/dL (ref 0.57–1.00)
GFR est AA: 105 mL/min/{1.73_m2} (ref >59)
GFR est non-AA: 91 mL/min/{1.73_m2} (ref >59)
GLOBULIN, TOTAL: 2.4 g/dL (ref 1.5–4.5)
Glucose: 83 mg/dL (ref 65–99)
Potassium: 4.4 mmol/L (ref 3.5–5.2)
Protein, total: 6.6 g/dL (ref 6.0–8.5)
Sodium: 140 mmol/L (ref 134–144)

## 2014-07-03 LAB — THYROID CASCADE PROFILE: TSH: 2.31 u[IU]/mL (ref 0.450–4.500)

## 2014-07-03 LAB — HCV AB W/RFLX TO VERIFICATION: HCV Ab: <0.1 s/co ratio (ref 0.0–0.9)

## 2014-07-03 LAB — COMMENT

## 2014-07-03 NOTE — Progress Notes (Signed)
Quick Note:        Labs are all normal. She can review all my chart.. Recheck in    ______

## 2014-09-24 ENCOUNTER — Encounter

## 2014-09-24 MED ORDER — RIVAROXABAN 20 MG TAB
20 mg | ORAL_TABLET | Freq: Every day | ORAL | Status: DC
Start: 2014-09-24 — End: 2015-04-14

## 2014-09-24 NOTE — Telephone Encounter (Signed)
Pt requested refill on Xarelto, rx sent in, and pt is aware via vm

## 2014-09-25 ENCOUNTER — Encounter

## 2014-09-25 MED ORDER — MONTELUKAST 10 MG TAB
10 mg | ORAL_TABLET | Freq: Every day | ORAL | Status: DC
Start: 2014-09-25 — End: 2015-09-10

## 2014-09-25 MED ORDER — ATORVASTATIN 40 MG TAB
40 mg | ORAL_TABLET | Freq: Every day | ORAL | Status: DC
Start: 2014-09-25 — End: 2015-09-10

## 2014-09-25 NOTE — Telephone Encounter (Signed)
Pt requested refill on Lipitor and Singulair, Rx sent in and patient is aware

## 2014-10-17 ENCOUNTER — Institutional Professional Consult (permissible substitution): Admit: 2014-10-17 | Discharge: 2014-11-03 | Payer: MEDICARE | Primary: Family Medicine

## 2014-10-17 DIAGNOSIS — Z23 Encounter for immunization: Secondary | ICD-10-CM

## 2014-10-17 NOTE — Patient Instructions (Signed)
Vaccine Information Statement    Influenza (Flu) Vaccine (Inactivated or Recombinant): What you need to know    Many Vaccine Information Statements are available in Spanish and other languages. See www.immunize.org/vis  Hojas de Informaci??n Sobre Vacunas est??n disponibles en Espa??ol y en muchos otros idiomas. Visite www.immunize.org/vis    1. Why get vaccinated?    Influenza (???flu???) is a contagious disease that spreads around the [place] every year, usually between October and May.     Flu is caused by influenza viruses, and is spread mainly by coughing, sneezing, and close contact.     Anyone can get flu. Flu strikes suddenly and can last several days. Symptoms vary by age, but can include:  ??? fever/chills  ??? sore throat  ??? muscle aches  ??? fatigue  ??? cough  ??? headache   ??? runny or stuffy nose    Flu can also lead to pneumonia and blood infections, and cause diarrhea and seizures in children.  If you have a medical condition, such as heart or lung disease, flu can make it worse.    Flu is more dangerous for some people. Infants and young children, people 65 years of age and older, pregnant women, and people with certain health conditions or a weakened immune system are at greatest risk.      Each year thousands of people in the [place] die from flu, and many more are hospitalized.     Flu vaccine can:  ??? keep you from getting flu,  ??? make flu less severe if you do get it, and  ??? keep you from spreading flu to your family and other people.     2. Inactivated and recombinant flu vaccines    A dose of flu vaccine is recommended every flu season. Children 6 months through 8 years of age may need two doses during the same flu season.  Everyone else needs only one dose each flu season.       Some inactivated flu vaccines contain a very small amount of a mercury-based preservative called thimerosal. Studies have not shown thimerosal in vaccines to be harmful, but flu vaccines that do not contain  thimerosal are available.    There is no live flu virus in flu shots.  They cannot cause the flu.     There are many flu viruses, and they are always changing. Each year a new flu vaccine is made to protect against three or four viruses that are likely to cause disease in the upcoming flu season. But even when the vaccine doesn???t exactly match these viruses, it may still provide some protection    Flu vaccine cannot prevent:  ??? flu that is caused by a virus not covered by the vaccine, or  ??? illnesses that look like flu but are not.    It takes about 2 weeks for protection to develop after vaccination, and protection lasts through the flu season.     3. Some people should not get this vaccine    Tell the person who is giving you the vaccine:    ??? If you have any severe, life-threatening allergies.    If you ever had a life-threatening allergic reaction after a dose of flu vaccine, or have a severe allergy to any part of this vaccine, you may be advised not to get vaccinated.  Most, but not all, types of flu vaccine contain a small amount of egg protein.       ??? If you   ever had Guillain-Barr?? Syndrome (also called GBS).   Some people with a history of GBS should not get this vaccine. This should be discussed with your doctor.    ??? If you are not feeling well.    It is usually okay to get flu vaccine when you have a mild illness, but you might be asked to come back when you feel better.      4. Risks of a vaccine reaction    With any medicine, including vaccines, there is a chance of reactions. These are usually mild and go away on their own, but serious reactions are also possible.     Most people who get a flu shot do not have any problems with it.     Minor problems following a flu shot include:   ??? soreness, redness, or swelling where the shot was given    ??? hoarseness  ??? sore, red or itchy eyes  ??? cough  ??? fever  ??? aches  ??? headache  ??? itching  ??? fatigue   If these problems occur, they usually begin soon after the shot and last 1 or 2 days.     More serious problems following a flu shot can include the following:    ??? There may be a small increased risk of Guillain-Barr?? Syndrome (GBS) after inactivated flu vaccine.  This risk has been estimated at 1 or 2 additional cases per million people vaccinated. This is much lower than the risk of severe complications from flu, which can be prevented by flu vaccine.      ??? Young children who get the flu shot along with pneumococcal vaccine (PCV13) and/or DTaP vaccine at the same time might be slightly more likely to have a seizure caused by fever. Ask your doctor for more information. Tell your doctor if a child who is getting flu vaccine has ever had a seizure.     Problems that could happen after any injected vaccine:     ??? People sometimes faint after a medical procedure, including vaccination. Sitting or lying down for about 15 minutes can help prevent fainting, and injuries caused by a fall. Tell your doctor if you feel dizzy, or have vision changes or ringing in the ears.    ??? Some people get severe pain in the shoulder and have difficulty moving the arm where a shot was given. This happens very rarely.    ??? Any medication can cause a severe allergic reaction. Such reactions from a vaccine are very rare, estimated at about 1 in a million doses, and would happen within a few minutes to a few hours after the vaccination.    As with any medicine, there is a very remote chance of a vaccine causing a serious injury or death.    The safety of vaccines is always being monitored. For more information, visit: www.cdc.gov/vaccinesafety/    5. What if there is a serious reaction?    What should I look for?    ??? Look for anything that concerns you, such as signs of a severe allergic reaction, very high fever, or unusual behavior.    Signs of a severe allergic reaction can include hives, swelling of the  face and throat, difficulty breathing, a fast heartbeat, dizziness, and weakness ??? usually within a few minutes to a few hours after the vaccination.    What should I do?    ??? If you think it is a severe allergic reaction or other emergency that   can???t wait, call 9-1-1 and get the person to the nearest hospital. Otherwise, call your doctor.    ??? Reactions should be reported to the Vaccine Adverse Event Reporting System (VAERS). Your doctor should file this report, or you can do it yourself through  the VAERS web site at www.vaers.hhs.gov, or by calling 1-800-822-7967.    VAERS does not give medical advice.    6. The National Vaccine Injury Compensation Program    The National Vaccine Injury Compensation Program (VICP) is a federal program that was created to compensate people who may have been injured by certain vaccines.    Persons who believe they may have been injured by a vaccine can learn about the program and about filing a claim by calling 1-800-338-2382 or visiting the VICP website at www.hrsa.gov/vaccinecompensation.  There is a time limit to file a claim for compensation.    7. How can I learn more?  ??? Ask your healthcare provider. He or she can give you the vaccine package insert or suggest other sources of information.  ??? Call your local or state health department.  ??? Contact the Centers for Disease Control and Prevention (CDC):  - Call 1-800-232-4636 (1-800-CDC-INFO) or  - Visit CDC???s website at www.cdc.gov/flu    Vaccine Information Statement   Inactivated Influenza Vaccine   07/04/2014  42 U[place] ?? [id number]    Department of Health and Human Services  Centers for Disease Control and Prevention    Office Use Only

## 2015-03-13 ENCOUNTER — Other Ambulatory Visit: Payer: Self-pay | Admitting: Internal Medicine

## 2015-03-13 DIAGNOSIS — Z1231 Encounter for screening mammogram for malignant neoplasm of breast: Secondary | ICD-10-CM

## 2015-04-14 MED ORDER — RIVAROXABAN 20 MG TAB
20 mg | ORAL_TABLET | Freq: Every day | ORAL | Status: DC
Start: 2015-04-14 — End: 2015-09-10

## 2015-04-14 NOTE — Telephone Encounter (Signed)
Pt requested refill,rx sent in and patient is aware

## 2015-04-28 ENCOUNTER — Ambulatory Visit: Payer: Self-pay

## 2015-05-06 NOTE — Telephone Encounter (Signed)
Pt is requesting a letter stating that she is mentally capable of driving the church van. They only let them drive up to age of 21

## 2015-05-06 NOTE — Telephone Encounter (Signed)
Pt has not been seen in over a year so she would need to come in before we could do that. Left message on machine

## 2015-05-07 ENCOUNTER — Ambulatory Visit
Admission: RE | Admit: 2015-05-07 | Discharge: 2015-05-07 | Disposition: A | Discharge status: Home / Self Care | Payer: Medicare Other | Source: Ambulatory Visit | Attending: Internal Medicine | Admitting: Internal Medicine

## 2015-05-07 DIAGNOSIS — Z1231 Encounter for screening mammogram for malignant neoplasm of breast: Secondary | ICD-10-CM | POA: Insufficient documentation

## 2015-05-07 HISTORY — DX: Malignant (primary) neoplasm, unspecified: C80.1

## 2015-05-12 ENCOUNTER — Ambulatory Visit
Admit: 2015-05-12 | Discharge: 2015-05-12 | Payer: BLUE CROSS/BLUE SHIELD | Attending: Family Medicine | Primary: Family Medicine

## 2015-05-12 ENCOUNTER — Ambulatory Visit: Attending: Family Medicine | Primary: Family Medicine

## 2015-05-12 DIAGNOSIS — Z0000 Encounter for general adult medical examination without abnormal findings: Secondary | ICD-10-CM

## 2015-05-12 NOTE — Progress Notes (Signed)
This is a Subsequent Medicare Annual Wellness Visit providing Personalized Prevention Plan Services (PPPS) (Performed 12 months after initial AWV and PPPS )    I have reviewed the patient's medical history in detail and updated the computerized patient record.     History     Past Medical History   Diagnosis Date   ??? Allergic rhinitis, cause unspecified 11/27/2012   ??? Contact dermatitis and other eczema, due to unspecified cause 11/27/2012   ??? Long term (current) use of anticoagulants 11/27/2012   ??? Encounter for long-term (current) use of other medications 11/27/2012   ??? Nonspecific abnormal electrocardiogram (ECG) (EKG) 11/27/2012   ??? 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 Osteoporosis       Past Surgical History   Procedure Laterality Date   ??? Hx gyn       Removed tubes   ??? Pr abdomen surgery proc unlisted     ??? Hx cholecystectomy     ??? Vascular surgery procedure unlist       Left leg striped the vein     Current Outpatient Prescriptions   Medication Sig Dispense Refill   ??? rivaroxaban (XARELTO) 20 mg tab tablet Take 1 Tab by mouth daily (with dinner). 30 Tab 5   ??? atorvastatin (LIPITOR) 40 mg tablet Take 1 Tab by mouth daily. 30 Tab 5   ??? montelukast (SINGULAIR) 10 mg tablet Take 1 Tab by mouth daily. 30 Tab 5   ??? albuterol (PROVENTIL HFA, VENTOLIN HFA, PROAIR HFA) 90 mcg/actuation inhaler Take 2 Puffs by inhalation every four (4) hours as needed for Wheezing (FOR SOB). 1 Inhaler 5   ??? alendronate (FOSAMAX) 70 mg tablet Take 1 Tab by mouth every Monday. 4 Tab 5   ??? nitroglycerin (NITROSTAT) 0.4 mg SL tablet by SubLINGual route every five (5) minutes as needed for Chest Pain.     ??? calcium-vitamin D (OYSTER SHELL CALCIUM-VIT D3) 250-125 mg-unit tablet Take 1 Tab by mouth daily.       Allergies   Allergen Reactions   ??? Codeine Unable to Obtain     Family History   Problem Relation Age of Onset   ??? Diabetes Mother    ??? Cancer Father    ??? Asthma Father    ??? Heart Disease Father     ??? Diabetes Sister    ??? Cancer Brother      History   Substance Use Topics   ??? Smoking status: Never Smoker    ??? Smokeless tobacco: Never Used   ??? Alcohol Use: No     Patient Active Problem List   Diagnosis Code   ??? Allergic rhinitis, cause unspecified J30.9   ??? Contact dermatitis and other eczema, due to unspecified cause L25.9   ??? Encounter for long-term (current) use of other medications Z79.899   ??? Nonspecific abnormal electrocardiogram (ECG) (EKG) R94.31   ??? Other and unspecified hyperlipidemia E78.5   ??? DVT (deep venous thrombosis) (HCC) I82.409   ??? 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  No  Do you average more than 7 drinks per week?  No      Functional Ability and Level of Safety:     Hearing Loss   normal-to-mild    Activities of Daily Living   Self-care.   Requires assistance with: no ADLs    Fall Risk     Fall Risk Assessment, last 12 mths 05/12/2015   Able to walk? Yes   Fall in past 12 months? No   Fall with injury? -   Number of falls in past 12 months -   Fall Risk Score -     Abuse Screen   Patient is not abused    Review of Systems   A comprehensive review of systems was negative except for that written in the HPI.    Physical Examination     Evaluation of Cognitive Function:  Mood/affect:  happy  Appearance: age appropriate  Family member/caregiver input: none   Mini Mental State Exam 05/12/2015   What is the Year 1   What is the Season 1   What is the Date 1   What is the Day 1   What is the Month 1   Where are we State 1   Where are we Country 1   Where are we [place] or [place] 1   [place] are we Floor 1   Name three objects, then ask the patient to say them 3    Serial sevens Subtract 7 from 100 in increments 5   Ask for the three objects repeated above 3   Name a pencil 1   Name a watch 1   Have the patient repeat this phrase "No ifs, ands, or buts" 1   Three stage command: Take the paper in your right hand 1   Fold the paper in half 1   Put the paper on the floor 1   Read and obey the following: CLOSE YOUR EYES 1   Have the patient write a sentence 1   Have the patient copy a figure 1   Mini Mental Score 30         BP 118/70 mmHg   Pulse 79   Temp(Src) 97.9 ??F (36.6 ??C) (Oral)   Ht  (1.549 m)   Wt 152 lb (68.947 kg)   BMI 28.74 kg/m2   SpO2 97%  General appearance: alert, cooperative, no distress, appears stated age  Head: Normocephalic, without obvious abnormality, atraumatic  Throat: Lips, mucosa, and tongue normal. Teeth and gums normal  Neck: supple, symmetrical, trachea midline, no adenopathy, thyroid: not enlarged, symmetric, no tenderness/mass/nodules, no carotid bruit and no JVD  Lungs: clear to auscultation bilaterally  Heart: regular rate and rhythm, S1, S2 normal, no murmur, click, rub or gallop  Extremities: extremities normal, atraumatic, no cyanosis or edema  Pulses: 2+ and symmetric  Neurologic: Grossly normal    Patient Care Team:  Miles Costain, MD as PCP - General (Family Practice)    Advice/Referrals/Counseling   Education and counseling provided:  Are appropriate based on today's review and evaluation  Pneumococcal Vaccine  Influenza Vaccine  Screening Mammography  Colorectal cancer screening tests  Cardiovascular screening blood test  Bone mass measurement (DEXA)  Screening for glaucoma    Assessment/Plan       ICD-10-CM ICD-9-CM    1. Routine general medical examination at a health care facility Z00.00 V70.0    2. Screening for alcoholism Z13.89 V79.1    3. Encounter for screening mammogram for  malignant neoplasm of breast Z12.31 V76.12 MAM MAMMO BI SCREENING DIGTL   4. Postmenopausal Z78.0 V49.81 DEXA BONE DENSITY STUDY AXIAL    5. Encounter for immunization Z23 V03.89 ADMIN PNEUMOCOCCAL VACCINE      PNEUMOCOCCAL CONJ VACCINE 13 VALENT IM     reviewed diet, exercise and weight control  cardiovascular risk and specific lipid/LDL goals reviewed.  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 Patient education is provided. Patient has had a colonoscopy but can't remember when, but states she had no Polyps and it has been less than 10 years. She has opted to do the Cologuard screening. She has never had a positive PAP her last one was done around age 22.

## 2015-05-12 NOTE — Patient Instructions (Signed)
Medicare Wellness Visit, Female    The best way to live healthy is to have a healthy lifestyle by eating a well-balanced diet, exercising regularly, limiting alcohol and stopping smoking.    Regular physical exams and screening tests are another way to keep healthy. Preventive exams provided by your health care provider can find health problems before they become diseases or illnesses. Preventive services including immunizations, screening tests, monitoring and exams can help you take care of your own health.    All people over age 41 should have a pneumovax  and and a prevnar shot to prevent pneumonia. These are once in a lifetime unless you and your provider decide differently.    All people over 65 should have a yearly flu shot and a tetanus vaccine every 10 years.    A bone mass density to screen for osteoporosis or thinning of the bones should be done every 2 years after 65.    Screening for diabetes mellitus with a blood sugar test should be done every year.    Glaucoma is a disease of the eye due to increased ocular pressure that can lead to blindness and it should be done every year by an eye professional.    Cardiovascular screening tests that check for elevated lipids (fatty part of blood) which can lead to heart disease and strokes should be done every 5 years.    Colorectal screening that evaluates for blood or polyps in your colon should be done yearly as a stool test or every five years as a flexible sigmoidoscope or every 10 years as a colonoscopy up to age 31.    Breast cancer screening with a mammogram is recommended biennially  for women age 81-74.    Screening for cervical cancer with a pap smear and pelvic exam is recommended for women after age 32 years every 2 years up to age 11 or when the provider and patient decide to stop.If there is a history of cervical abnormalities or other increased risk for cancer then the test is recommended yearly.     Hepatitis C screening is also recommended for anyone born between [birth year] through 1965.    A shingles vaccine is also recommended once in a lifetime after age 54.    Your Medicare Wellness Exam is recommended annually.    Here is a list of your current Health Maintenance items with a due date:  Health Maintenance Due   Topic Date Due   ??? Colonoscopy  03/21/1964   ??? DTaP/Tdap/Td  (1 - Tdap) 03/22/1967   ??? Shingles Vaccine  03/21/2006   ??? Glaucoma Screening   03/22/2011           Osteoporosis: Care Instructions  Your Care Instructions     Osteoporosis causes bones to become thin and weak. It is much more common in women than in men. Osteoporosis may be very advanced before you know you have it. Sometimes the first sign is a broken bone in the hip, spine, or wrist or sudden pain in your middle or lower back.  Follow-up care is a key part of your treatment and safety. Be sure to make and go to all appointments, and call your doctor if you are having problems. It???s also a good idea to know your test results and keep a list of the medicines you take.  How can you care for yourself at home?  ?? Your doctor may prescribe a bisphosphonate, such as risedronate (Actonel) or alendronate (Fosamax), for osteoporosis. If  you are taking one of these medicines by mouth:  ?? Take your medicine with a full glass of water when you first get up in the morning.  ?? Do not lie down, eat, drink a beverage, or take any other medicine for at least 30 minutes after taking the drug. This helps prevent stomach problems.  ?? Do not take your medicine late in the day if you forgot to take it in the morning. Skip it, and take the usual dose the next morning.  ?? If you have side effects, tell your doctor. He or she may prescribe another medicine.  ?? Get enough calcium and vitamin D. The Institute of [employer] recommends adults younger than age 69 need 1,000 mg of calcium and 600 IU of vitamin  D each day. Women ages 1951 to 8370 need 1,200 mg of calcium and 600 IU of vitamin D each day. Men ages 6951 to 3170 need 1,000 mg of calcium and 600 IU of vitamin D each day. Adults 71 and older need 1,200 mg of calcium and 800 IU of vitamin D each day..  ?? Eat foods rich in calcium, like yogurt, cheese, milk, and dark green vegetables. This is a good way to get the calcium you need. You can get vitamin D from eggs, fatty fish, cereal, and milk.  ?? Talk to your doctor about taking a calcium plus vitamin D supplement. Be careful, though. Adults ages 2319 to 5750 should not get more than 2,500 mg of calcium and 4,000 IU of vitamin D each day, whether it is from supplements and/or food. Adults ages 8751 and older should not get more than 2,000 mg of calcium and 4,000 IU of vitamin D each day from supplements and/or food.  ?? Limit alcohol to 2 drinks a day for men and 1 drink a day for women. Too much alcohol can cause health problems.  ?? Do not smoke. Smoking puts you at a much higher risk for osteoporosis. If you need help quitting, talk to your doctor about stop-smoking programs and medicines. These can increase your chances of quitting for good.  ?? Get regular bone-building exercise. Weight-bearing and resistance exercises keep bones healthy by working the muscles and bones against gravity. Start out at an exercise level that feels right for you. Add a little at a time until you can do the following:  ?? Do 30 minutes of weight-bearing exercise on most days of the week. Walking, jogging, stair climbing, and dancing are good choices.  ?? Do resistance exercises with weights or elastic bands 2 to 3 days a week.  ?? Reduce your risk of falls:  ?? Wear supportive shoes with low heels and nonslip soles.  ?? Use a cane or walker, if you need it. Use shower chairs and bath benches. Put in handrails on stairways, around your shower or tub area, and near the toilet.  ?? Keep stairs, porches, and walkways well lit. Use night-lights.   ?? Remove throw rugs and other objects that are in the way.  ?? Avoid icy, wet, or slippery surfaces.  ?? Keep a cordless phone and a flashlight with new batteries by your bed.  When should you call for help?  Call your doctor now or seek immediate medical care if:  ?? You think you have broken a bone, have pain and swelling after a fall, or cannot move a part of your body.  ?? You have sudden, severe pain when you stand or walk.  Watch closely  for changes in your health, and be sure to contact your doctor if you have any problems.  Where can you learn more?  Go to [employer]  Enter K100 in the search box to learn more about "Osteoporosis: Care Instructions."  ?? 2006-2016 [employer]. Care instructions adapted under license by [employer] (which disclaims liability or warranty for this information). This care instruction is for use with your licensed healthcare professional. If you have questions about a medical condition or this instruction, always ask your healthcare professional. [employer] disclaims any warranty or liability for your use of this information.  Content Version: 10.9.538570; Current as of: October 17, 2014          Breast Cancer Screening: Care Instructions  Your Care Instructions  A breast X-ray (mammogram) and an exam by your doctor can help find breast cancer early. Cancer is easier to treat when it's found early. If you are age 35 or older, ask your doctor when to start and how often to have a mammogram. The X-ray can spot tumors that are too small to be felt by hand. (It also can show harmless lumps, such as fluid-filled cysts).  During a breast exam, your doctor will feel your breasts for lumps or any other possible signs of cancer. During a mammogram, a machine squeezes your breasts to make them flatter and easier to X-ray. Your breasts may feel a bit sore as the machine squeezes. After the test, a doctor will  study your mammogram. Your doctor will tell you the results. You will also be told if you need any follow-up tests.  Follow-up care is a key part of your treatment and safety. Be sure to make and go to all appointments, and call your doctor if you are having problems. It's also a good idea to know your test results and keep a list of the medicines you take.  What should you do to get ready for a mammogram?  ?? On the day of the mammogram, do not use any deodorant, perfume, powders, or lotions on your breasts or armpits. They may affect the X-rays.  ?? Remove any jewelry. You will need to take off your clothes above the waist. You will put on a cloth or paper top. If you are concerned about an area of your breast, show the technologist so that the area can be noted.  How can you care for yourself at home?  ?? If you have breast pain after the mammogram, take an over-the-counter pain medicine, such as acetaminophen (Tylenol), ibuprofen (Advil, Motrin), or naproxen (Aleve). Read and follow all instructions on the label.  ?? Do not take two or more pain medicines at the same time unless the doctor told you to. Many pain medicines have acetaminophen, which is Tylenol. Too much acetaminophen (Tylenol) can be harmful.  When should you call for help?  Watch closely for changes in your health, and be sure to contact your doctor if:  ?? You notice any changes in your breasts or the skin on your breasts. These may include lumps, fluid leaking suddenly from your nipples, or changes to the skin on your breast or nipple.  Where can you learn more?  Go to [employer]  Enter H706 in the search box to learn more about "Breast Cancer Screening: Care Instructions."  ?? 2006-2016 [employer]. Care instructions adapted under license by [employer] (which disclaims liability or warranty for this information). This care instruction is for use with your licensed  healthcare professional. If you have questions about a medical condition or this instruction, always ask your healthcare professional. [employer] disclaims any warranty or liability for your use of this information.  Content Version: 10.9.538570; Current as of: October 17, 2014          Advance Directives: Care Instructions  Your Care Instructions  An advance directive is a legal way to state your wishes at the end of your life. It tells your family and your doctor what to do if you can no longer say what you want.  There are two main types of advance directives. You can change them any time that your wishes change.  ?? A living will tells your family and your doctor your wishes about life support and other treatment.  ?? A medical power of attorney lets you name a person to make treatment decisions for you when you can't speak for yourself. This person is called a health care agent.  If you do not have an advance directive, decisions about your medical care may be made by a doctor or a judge who doesn't know you.  It may help to think of an advance directive as a gift to the people who care for you. If you have one, they won't have to make tough decisions by themselves.  Follow-up care is a key part of your treatment and safety. Be sure to make and go to all appointments, and call your doctor if you are having problems. It's also a good idea to know your test results and keep a list of the medicines you take.  How can you care for yourself at home?  ?? Discuss your wishes with your loved ones and your doctor. This way, there are no surprises.  ?? Many states have a unique form. Or you might use a universal form that has been approved by many states. This kind of form can sometimes be completed and stored online. Your electronic copy will then be available wherever you have a connection to the Internet. In most cases, doctors will respect your wishes even if you have a form from a different state.   ?? You don't need a lawyer to do an advance directive. But you may want to get legal advice.  ?? Think about these questions when you prepare an advance directive:  ?? Who do you want to make decisions about your medical care if you are not able to? Many people choose a family member, close friend, or doctor.  ?? Do you know enough about life support methods that might be used? If not, talk to your doctor so you understand.  ?? What are you most afraid of that might happen? You might be afraid of having pain, losing your independence, or being kept alive by machines.  ?? Where would you prefer to die? Choices include your home, a hospital, or a nursing home.  ?? Would you like to have information about hospice care to support you and your family?  ?? Do you want to donate organs when you die?  ?? Do you want certain religious practices performed before you die? If so, put your wishes in the advance directive.  ?? Read your advance directive every year, and make changes as needed.  When should you call for help?  Be sure to contact your doctor if you have any questions.  Where can you learn more?  Go to [employer]  Enter R264 in the search box to learn more about "Advance  Directives: Care Instructions."  ?? 2006-2016 [employer]. Care instructions adapted under license by [employer] (which disclaims liability or warranty for this information). This care instruction is for use with your licensed healthcare professional. If you have questions about a medical condition or this instruction, always ask your healthcare professional. [employer] disclaims any warranty or liability for your use of this information.  Content Version: 10.9.538570; Current as of: January 21, 2015

## 2015-05-29 NOTE — Progress Notes (Signed)
[phone] for 08/05/15 at 230pm pt is aware

## 2015-06-09 ENCOUNTER — Encounter: Payer: BLUE CROSS/BLUE SHIELD | Attending: Family Medicine | Primary: Family Medicine

## 2015-06-21 ENCOUNTER — Emergency Department
Admission: EM | Admit: 2015-06-21 | Discharge: 2015-06-21 | Disposition: A | Discharge status: Home / Self Care | Payer: Medicare Other | Attending: Emergency Medicine | Admitting: Emergency Medicine

## 2015-06-21 ENCOUNTER — Emergency Department: Payer: Medicare Other

## 2015-06-21 DIAGNOSIS — Z880 Allergy status to penicillin: Secondary | ICD-10-CM | POA: Diagnosis not present

## 2015-06-21 DIAGNOSIS — N390 Urinary tract infection, site not specified: Secondary | ICD-10-CM | POA: Diagnosis not present

## 2015-06-21 DIAGNOSIS — R109 Unspecified abdominal pain: Secondary | ICD-10-CM

## 2015-06-21 DIAGNOSIS — R1084 Generalized abdominal pain: Secondary | ICD-10-CM | POA: Diagnosis present

## 2015-06-21 LAB — COMPREHENSIVE METABOLIC PANEL
ALK PHOS: 46 U/L (ref 38–126)
ALT: 45 U/L (ref 14–54)
ANION GAP: 10 (ref 5–15)
AST: 42 U/L — ABNORMAL HIGH (ref 15–41)
Albumin: 4.4 g/dL (ref 3.5–5.0)
BUN: 13 mg/dL (ref 6–20)
CO2: 27 mmol/L (ref 22–32)
CREATININE: 0.56 mg/dL (ref 0.44–1.00)
Calcium: 9.5 mg/dL (ref 8.9–10.3)
Chloride: 100 mmol/L — ABNORMAL LOW (ref 101–111)
GFR calc non Af Amer: >60 mL/min (ref >60)
GLUCOSE: 110 mg/dL — AB (ref 65–99)
POTASSIUM: 3.4 mmol/L — AB (ref 3.5–5.1)
Sodium: 137 mmol/L (ref 135–145)
TOTAL PROTEIN: 7.5 g/dL (ref 6.5–8.1)
Total Bilirubin: 1 mg/dL (ref 0.3–1.2)

## 2015-06-21 LAB — URINALYSIS COMPLETE WITH MICROSCOPIC (ARMC ONLY)
Bacteria, UA: NONE SEEN
Bilirubin Urine: NEGATIVE
Glucose, UA: NEGATIVE mg/dL
Ketones, ur: NEGATIVE mg/dL
Nitrite: NEGATIVE
PH: 6 (ref 5.0–8.0)
PROTEIN: NEGATIVE mg/dL
Specific Gravity, Urine: 1.012 (ref 1.005–1.030)

## 2015-06-21 LAB — CBC
HCT: 39.5 % (ref 35.0–47.0)
HEMOGLOBIN: 13.7 g/dL (ref 12.0–16.0)
MCH: 30.6 pg (ref 26.0–34.0)
MCHC: 34.7 g/dL (ref 32.0–36.0)
MCV: 88.1 fL (ref 80.0–100.0)
Platelets: 183 10*3/uL (ref 150–440)
RBC: 4.49 MIL/uL (ref 3.80–5.20)
RDW: 12.1 % (ref 11.5–14.5)
WBC: 4.9 10*3/uL (ref 3.6–11.0)

## 2015-06-21 LAB — LIPASE, BLOOD: Lipase: 18 U/L — ABNORMAL LOW (ref 22–51)

## 2015-06-21 MED ORDER — FOSFOMYCIN TROMETHAMINE 3 G PO PACK
3.0000 g | PACK | ORAL | Status: AC
  Administered 2015-06-21: 3 g via ORAL
  Filled 2015-06-21: qty 3

## 2015-06-21 MED ORDER — FOSFOMYCIN TROMETHAMINE 3 G PO PACK: 3.0000 g | PACK | Freq: Once | ORAL | Status: DC

## 2015-06-21 NOTE — Discharge Instructions (Signed)
Urinary Tract Infection  Your urine was sent for culture which shall result in approximately 2 days.   You have been seen in the Emergency Department (ED) today for pain when urinating.  Your workup today suggests that you have a urinary tract infection (UTI).  Call your regular doctor to schedule the next available appointment to follow up on todays ED visit, or return immediately to the ED if your pain worsens, you have decreased urine production, develop fever, persistent vomiting, or other symptoms that concern you.   Urinary tract infections (UTIs) can develop anywhere along your urinary tract. Your urinary tract is your body's drainage system for removing wastes and extra water. Your urinary tract includes two kidneys, two ureters, a bladder, and a urethra. Your kidneys are a pair of bean-shaped organs. Each kidney is about the size of your fist. They are located below your ribs, one on each side of your spine. CAUSES Infections are caused by microbes, which are microscopic organisms, including fungi, viruses, and bacteria. These organisms are so small that they can only be seen through a microscope. Bacteria are the microbes that most commonly cause UTIs. SYMPTOMS  Symptoms of UTIs may vary by age and gender of the patient and by the location of the infection. Symptoms in young women typically include a frequent and intense urge to urinate and a painful, burning feeling in the bladder or urethra during urination. Older women and men are more likely to be tired, shaky, and weak and have muscle aches and abdominal pain. A fever may mean the infection is in your kidneys. Other symptoms of a kidney infection include pain in your back or sides below the ribs, nausea, and vomiting. DIAGNOSIS To diagnose a UTI, your caregiver will ask you about your symptoms. Your caregiver also will ask to provide a urine sample. The urine sample will be tested for bacteria and white blood cells. White blood cells  are made by your body to help fight infection. TREATMENT  Typically, UTIs can be treated with medication. Because most UTIs are caused by a bacterial infection, they usually can be treated with the use of antibiotics. The choice of antibiotic and length of treatment depend on your symptoms and the type of bacteria causing your infection. HOME CARE INSTRUCTIONS  If you were prescribed antibiotics, take them exactly as your caregiver instructs you. Finish the medication even if you feel better after you have only taken some of the medication.  Drink enough water and fluids to keep your urine clear or pale yellow.  Avoid caffeine, tea, and carbonated beverages. They tend to irritate your bladder.  Empty your bladder often. Avoid holding urine for long periods of time.  Empty your bladder before and after sexual intercourse.  After a bowel movement, women should cleanse from front to back. Use each tissue only once. SEEK MEDICAL CARE IF:   You have back pain.  You develop a fever.  Your symptoms do not begin to resolve within 3 days. SEEK IMMEDIATE MEDICAL CARE IF:   You have severe back pain or lower abdominal pain.  You develop chills.  You have nausea or vomiting.  You have continued burning or discomfort with urination. MAKE SURE YOU:   Understand these instructions.  Will watch your condition.  Will get help right away if you are not doing well or get worse. Document Released: 08/24/2005 Document Revised: 05/15/2012 Document Reviewed: 12/23/2011 [facility] Patient Information 2015 [place], [place]. This information is not intended to replace advice  given to you by your health care provider. Make sure you discuss any questions you have with your health care provider.

## 2015-06-21 NOTE — ED Notes (Signed)
Pt here for abdominal pain for past 2 months.  Has had diarrhea. Has been seen by her MD. Also reports having a "bladder infection"  Has been taking antibiotics for abdominal pain.

## 2015-06-21 NOTE — ED Provider Notes (Signed)
[facility] Emergency Department Provider Note  ____________________________________________  Time seen: Approximately 6:58 PM  I have reviewed the triage vital signs and the nursing notes.   HISTORY  Chief Complaint Abdominal Pain    HPI Sarah [name] is a 69 y.o. female who is a history of chronic loose stools and diarrhea, but saw her doctor on Monday because she was having burning with urination and feeling tired for a few days. She was diagnosed with urinary tract infection placed on ciprofloxacin. She states that her symptoms are not improved and she continues to feel tired, have burning when she urinates. She's had occasional abdominal achiness and cramping for about the last 2 days after she started taking Imodium. The symptoms are better now though she does feel a slight ache in the upper abdomen. No chest pain or trouble breathing. No pain in the right upper abdomen. She had her gallbladder removed.  No fever or chills. No pain in the back or flank.   Past Medical History  Diagnosis Date  . Cancer     thyroid- radiation    There are no active problems to display for this patient.   No past surgical history on file.  Current Outpatient Rx  Name  Route  Sig  Dispense  Refill  . fosfomycin (MONUROL) 3 G PACK   Oral   Take 3 g by mouth once. Please mix in 8 oz of water, take by mouth once   3 g   0     Allergies Penicillins  No family history on file.  Social History History  Substance Use Topics  . Smoking status: Not on file  . Smokeless tobacco: Not on file  . Alcohol Use: Not on file   she does not drink and does not smoke  Review of Systems Constitutional: No fever/chills Eyes: No visual changes. ENT: No sore throat. Cardiovascular: Denies chest pain. Respiratory: Denies shortness of breath. Gastrointestinal: See history of present illness No nausea, no vomiting.  Frequent loose stools, no bowel movement for about the last  24 hours. She does not feel constipated. She denies any significant pain that just occasional crampiness. Genitourinary: See history of present illness Musculoskeletal: Negative for back pain. Skin: Negative for rash. Neurological: Negative for headaches, focal weakness or numbness.  10-point ROS otherwise negative.  ____________________________________________   PHYSICAL EXAM:  VITAL SIGNS: ED Triage Vitals  Enc Vitals Group     BP 06/21/15 1656 148/73 mmHg     Pulse Rate 06/21/15 1656 89     Resp 06/21/15 1656 16     Temp 06/21/15 1656 98.3 F (36.8 C)     Temp Source 06/21/15 1656 Oral     SpO2 06/21/15 1656 96 %     Weight 06/21/15 1656 176 lb (79.833 kg)     Height 06/21/15 1656 5\' 3"  (1.6 m)     Head Cir --      Peak Flow --      Pain Score 06/21/15 1658 9     Pain Loc --      Pain Edu? --      Excl. in [place]? --     Constitutional: Alert and oriented. Well appearing and in no acute distress. Eyes: Conjunctivae are normal. PERRL. EOMI. Head: Atraumatic. Nose: No congestion/rhinnorhea. Mouth/Throat: Mucous membranes are moist.  Oropharynx non-erythematous. Neck: No stridor.   Cardiovascular: Normal rate, regular rhythm. Grossly normal heart sounds.  Good peripheral circulation. Respiratory: Normal respiratory effort.  No  retractions. Lungs CTAB. Gastrointestinal: Soft and nontender. No distention. No abdominal bruits. No CVA tenderness. No rebound or guarding. No mass or hernia. Her abdominal exam is very benign at this time. Musculoskeletal: No lower extremity tenderness nor edema.  No joint effusions. Neurologic:  Normal speech and language. No gross focal neurologic deficits are appreciated. No gait instability. Skin:  Skin is warm, dry and intact. No rash noted. Psychiatric: Mood and affect are normal. Speech and behavior are normal.  ____________________________________________   LABS (all labs ordered are listed, but only abnormal results are  displayed)  Labs Reviewed  LIPASE, BLOOD - Abnormal; Notable for the following:    Lipase 18 (*)    All other components within normal limits  COMPREHENSIVE METABOLIC PANEL - Abnormal; Notable for the following:    Potassium 3.4 (*)    Chloride 100 (*)    Glucose, Bld 110 (*)    AST 42 (*)    All other components within normal limits  URINALYSIS COMPLETEWITH MICROSCOPIC ([facility] ONLY) - Abnormal; Notable for the following:    Color, Urine YELLOW (*)    APPearance CLEAR (*)    Hgb urine dipstick 1+ (*)    Leukocytes, UA 3+ (*)    Squamous Epithelial / LPF 0-5 (*)    All other components within normal limits  CBC   ____________________________________________  EKG   ____________________________________________  RADIOLOGY  IMPRESSION: Fairly diffuse stool throughout colon. Overall bowel gas pattern unremarkable without obstruction or free air. Lung bases clear. ____________________________________________   PROCEDURES  Procedure(s) performed: None  Critical Care performed: No  ____________________________________________   INITIAL IMPRESSION / ASSESSMENT AND PLAN / ED COURSE  Pertinent labs & imaging results that were available during my care of the patient were reviewed by me and considered in my medical decision making (see chart for details).  Based on symptomatology has suspect the patient likely has a resistant urinary tract infection. She does not have any evidence to suggest significant or complicated infection, but I do believe she may have failed outpatient antibiotic ciprofloxacin which has a very high resistance profile in her community.  I will place her on fosfomycin here and give her an additional dose of fosfomycin to take in 2 days at home and follow-up with her primary doctor. We will obtain an abdominal x-ray to evaluate for any evidence of obstructive pathology but based on her physical examination I do not believe there is any signs of an acute  abdomen and no present indication for CT scan with a normal white count, reassuring exam without peritonitis. History and examination do not support concerns for appendicitis, pancreatitis, diverticulitis, acute bowel obstruction, pyelonephritis, or other major abdominal pathology.  I discussed with the patient to treatment plan and close return precautions. Should her symptoms of urinary infection such as burning, generalized slight weakness not improving within 24 hours I recommend she return to the emergency room or see her doctor for reevaluation as we do not yet know the exact etiology of her UTI as far as resistance profile though Cipro is highly likely to fail.  Careful return precautions advised including any fevers, vomiting, severe pain, bloody stools or diarrhea, or other new concerns arise. ____________________________________________   FINAL CLINICAL IMPRESSION(S) / ED DIAGNOSES  Final diagnoses:  Abdominal cramping  Urinary tract infection, acute      Delman Kitten, MD 06/21/15 2004

## 2015-06-21 NOTE — ED Notes (Signed)
Pt. States she went to her PCP this past Monday.  Pt. States she was dx with UTI and has been having diarrhea. Pt. States taking imodium for diarrhea.   Pt. States having bowel movement yesterday.  Pt. States abdominal pain is upper gastric pain. Pt. States PCP also gave a cholesterol medication to help with diarrhea, pt. States it was not effective.

## 2015-06-23 LAB — URINE CULTURE: Special Requests: NORMAL

## 2015-08-31 ENCOUNTER — Other Ambulatory Visit: Payer: Self-pay | Admitting: Internal Medicine

## 2015-08-31 DIAGNOSIS — Z1231 Encounter for screening mammogram for malignant neoplasm of breast: Secondary | ICD-10-CM

## 2015-09-09 ENCOUNTER — Ambulatory Visit: Payer: Medicare Other

## 2015-09-10 ENCOUNTER — Encounter

## 2015-09-10 MED ORDER — MONTELUKAST 10 MG TAB
10 mg | ORAL_TABLET | Freq: Every day | ORAL | 5 refills | Status: DC
Start: 2015-09-10 — End: 2016-04-14

## 2015-09-10 MED ORDER — ALENDRONATE 70 MG TAB
70 mg | ORAL_TABLET | ORAL | 5 refills | Status: DC
Start: 2015-09-10 — End: 2016-04-14

## 2015-09-10 MED ORDER — ATORVASTATIN 40 MG TAB
40 mg | ORAL_TABLET | Freq: Every day | ORAL | 5 refills | Status: DC
Start: 2015-09-10 — End: 2016-03-28

## 2015-09-10 MED ORDER — ALBUTEROL SULFATE HFA 90 MCG/ACTUATION AEROSOL INHALER
90 mcg/actuation | RESPIRATORY_TRACT | 5 refills | Status: DC | PRN
Start: 2015-09-10 — End: 2016-04-14

## 2015-09-10 MED ORDER — RIVAROXABAN 20 MG TAB
20 mg | ORAL_TABLET | Freq: Every day | ORAL | 5 refills | Status: DC
Start: 2015-09-10 — End: 2016-04-14

## 2015-09-10 NOTE — Telephone Encounter (Signed)
Pt requested refills, rx sent in and patient is aware

## 2015-09-14 NOTE — Progress Notes (Signed)
Pt rescheduled mammo and dexa for 10/15/15 at [facility] [phone]

## 2015-09-16 ENCOUNTER — Institutional Professional Consult (permissible substitution): Admit: 2015-09-16 | Discharge: 2015-09-16 | Payer: BLUE CROSS/BLUE SHIELD | Primary: Family Medicine

## 2015-09-16 DIAGNOSIS — Z23 Encounter for immunization: Secondary | ICD-10-CM

## 2015-09-16 NOTE — Patient Instructions (Signed)
Vaccine Information Statement    Influenza (Flu) Vaccine (Inactivated or Recombinant): What you need to know    Many Vaccine Information Statements are available in Spanish and other languages. See www.immunize.org/vis  Hojas de Informaci??n Sobre Vacunas est??n disponibles en Espa??ol y en muchos otros idiomas. Visite www.immunize.org/vis    1. Why get vaccinated?    Influenza (???flu???) is a contagious disease that spreads around the [place] every year, usually between October and May.     Flu is caused by influenza viruses, and is spread mainly by coughing, sneezing, and close contact.     Anyone can get flu. Flu strikes suddenly and can last several days. Symptoms vary by age, but can include:  ??? fever/chills  ??? sore throat  ??? muscle aches  ??? fatigue  ??? cough  ??? headache   ??? runny or stuffy nose    Flu can also lead to pneumonia and blood infections, and cause diarrhea and seizures in children.  If you have a medical condition, such as heart or lung disease, flu can make it worse.    Flu is more dangerous for some people. Infants and young children, people 65 years of age and older, pregnant women, and people with certain health conditions or a weakened immune system are at greatest risk.      Each year thousands of people in the [place] die from flu, and many more are hospitalized.     Flu vaccine can:  ??? keep you from getting flu,  ??? make flu less severe if you do get it, and  ??? keep you from spreading flu to your family and other people.     2. Inactivated and recombinant flu vaccines    A dose of flu vaccine is recommended every flu season. Children 6 months through 8 years of age may need two doses during the same flu season.  Everyone else needs only one dose each flu season.       Some inactivated flu vaccines contain a very small amount of a mercury-based preservative called thimerosal. Studies have not shown thimerosal in vaccines to be harmful, but flu vaccines that do not contain  thimerosal are available.    There is no live flu virus in flu shots.  They cannot cause the flu.     There are many flu viruses, and they are always changing. Each year a new flu vaccine is made to protect against three or four viruses that are likely to cause disease in the upcoming flu season. But even when the vaccine doesn???t exactly match these viruses, it may still provide some protection    Flu vaccine cannot prevent:  ??? flu that is caused by a virus not covered by the vaccine, or  ??? illnesses that look like flu but are not.    It takes about 2 weeks for protection to develop after vaccination, and protection lasts through the flu season.     3. Some people should not get this vaccine    Tell the person who is giving you the vaccine:    ??? If you have any severe, life-threatening allergies.    If you ever had a life-threatening allergic reaction after a dose of flu vaccine, or have a severe allergy to any part of this vaccine, you may be advised not to get vaccinated.  Most, but not all, types of flu vaccine contain a small amount of egg protein.       ??? If you   ever had Guillain-Barr?? Syndrome (also called GBS).   Some people with a history of GBS should not get this vaccine. This should be discussed with your doctor.    ??? If you are not feeling well.    It is usually okay to get flu vaccine when you have a mild illness, but you might be asked to come back when you feel better.      4. Risks of a vaccine reaction    With any medicine, including vaccines, there is a chance of reactions. These are usually mild and go away on their own, but serious reactions are also possible.     Most people who get a flu shot do not have any problems with it.     Minor problems following a flu shot include:   ??? soreness, redness, or swelling where the shot was given    ??? hoarseness  ??? sore, red or itchy eyes  ??? cough  ??? fever  ??? aches  ??? headache  ??? itching  ??? fatigue   If these problems occur, they usually begin soon after the shot and last 1 or 2 days.     More serious problems following a flu shot can include the following:    ??? There may be a small increased risk of Guillain-Barr?? Syndrome (GBS) after inactivated flu vaccine.  This risk has been estimated at 1 or 2 additional cases per million people vaccinated. This is much lower than the risk of severe complications from flu, which can be prevented by flu vaccine.      ??? Young children who get the flu shot along with pneumococcal vaccine (PCV13) and/or DTaP vaccine at the same time might be slightly more likely to have a seizure caused by fever. Ask your doctor for more information. Tell your doctor if a child who is getting flu vaccine has ever had a seizure.     Problems that could happen after any injected vaccine:     ??? People sometimes faint after a medical procedure, including vaccination. Sitting or lying down for about 15 minutes can help prevent fainting, and injuries caused by a fall. Tell your doctor if you feel dizzy, or have vision changes or ringing in the ears.    ??? Some people get severe pain in the shoulder and have difficulty moving the arm where a shot was given. This happens very rarely.    ??? Any medication can cause a severe allergic reaction. Such reactions from a vaccine are very rare, estimated at about 1 in a million doses, and would happen within a few minutes to a few hours after the vaccination.    As with any medicine, there is a very remote chance of a vaccine causing a serious injury or death.    The safety of vaccines is always being monitored. For more information, visit: www.cdc.gov/vaccinesafety/    5. What if there is a serious reaction?    What should I look for?    ??? Look for anything that concerns you, such as signs of a severe allergic reaction, very high fever, or unusual behavior.    Signs of a severe allergic reaction can include hives, swelling of the  face and throat, difficulty breathing, a fast heartbeat, dizziness, and weakness ??? usually within a few minutes to a few hours after the vaccination.    What should I do?    ??? If you think it is a severe allergic reaction or other emergency that   can???t wait, call 9-1-1 and get the person to the nearest hospital. Otherwise, call your doctor.    ??? Reactions should be reported to the Vaccine Adverse Event Reporting System (VAERS). Your doctor should file this report, or you can do it yourself through  the VAERS web site at www.vaers.hhs.gov, or by calling 1-800-822-7967.    VAERS does not give medical advice.    6. The National Vaccine Injury Compensation Program    The National Vaccine Injury Compensation Program (VICP) is a federal program that was created to compensate people who may have been injured by certain vaccines.    Persons who believe they may have been injured by a vaccine can learn about the program and about filing a claim by calling 1-800-338-2382 or visiting the VICP website at www.hrsa.gov/vaccinecompensation.  There is a time limit to file a claim for compensation.    7. How can I learn more?  ??? Ask your healthcare provider. He or she can give you the vaccine package insert or suggest other sources of information.  ??? Call your local or state health department.  ??? Contact the Centers for Disease Control and Prevention (CDC):  - Call 1-800-232-4636 (1-800-CDC-INFO) or  - Visit CDC???s website at www.cdc.gov/flu    Vaccine Information Statement   Inactivated Influenza Vaccine   07/04/2014  42 U[place] ?? [id number]    Department of Health and Human Services  Centers for Disease Control and Prevention    Office Use Only

## 2015-10-27 ENCOUNTER — Encounter

## 2015-12-28 ENCOUNTER — Emergency Department
Admission: EM | Admit: 2015-12-28 | Discharge: 2015-12-28 | Disposition: A | Discharge status: Home / Self Care | Payer: No Typology Code available for payment source | Attending: Emergency Medicine | Admitting: Emergency Medicine

## 2015-12-28 ENCOUNTER — Emergency Department: Payer: No Typology Code available for payment source

## 2015-12-28 ENCOUNTER — Encounter: Payer: Self-pay | Admitting: Emergency Medicine

## 2015-12-28 DIAGNOSIS — S299XXA Unspecified injury of thorax, initial encounter: Secondary | ICD-10-CM | POA: Diagnosis not present

## 2015-12-28 DIAGNOSIS — S39012A Strain of muscle, fascia and tendon of lower back, initial encounter: Secondary | ICD-10-CM | POA: Insufficient documentation

## 2015-12-28 DIAGNOSIS — Y998 Other external cause status: Secondary | ICD-10-CM | POA: Insufficient documentation

## 2015-12-28 DIAGNOSIS — Z7984 Long term (current) use of oral hypoglycemic drugs: Secondary | ICD-10-CM | POA: Diagnosis not present

## 2015-12-28 DIAGNOSIS — E119 Type 2 diabetes mellitus without complications: Secondary | ICD-10-CM | POA: Insufficient documentation

## 2015-12-28 DIAGNOSIS — Y92410 Unspecified street and highway as the place of occurrence of the external cause: Secondary | ICD-10-CM | POA: Diagnosis not present

## 2015-12-28 DIAGNOSIS — I10 Essential (primary) hypertension: Secondary | ICD-10-CM | POA: Diagnosis not present

## 2015-12-28 DIAGNOSIS — S20212A Contusion of left front wall of thorax, initial encounter: Secondary | ICD-10-CM | POA: Diagnosis not present

## 2015-12-28 DIAGNOSIS — S161XXA Strain of muscle, fascia and tendon at neck level, initial encounter: Secondary | ICD-10-CM | POA: Diagnosis not present

## 2015-12-28 DIAGNOSIS — Y9389 Activity, other specified: Secondary | ICD-10-CM | POA: Diagnosis not present

## 2015-12-28 DIAGNOSIS — S199XXA Unspecified injury of neck, initial encounter: Secondary | ICD-10-CM | POA: Diagnosis present

## 2015-12-28 DIAGNOSIS — Z880 Allergy status to penicillin: Secondary | ICD-10-CM | POA: Diagnosis not present

## 2015-12-28 HISTORY — DX: Essential (primary) hypertension: I10

## 2015-12-28 HISTORY — DX: Type 2 diabetes mellitus without complications: E11.9

## 2015-12-28 MED ORDER — DIAZEPAM 2 MG PO TABS: 2.0000 mg | ORAL_TABLET | Freq: Three times a day (TID) | ORAL | Status: AC | PRN

## 2015-12-28 MED ORDER — IBUPROFEN 600 MG PO TABS: 600.0000 mg | ORAL_TABLET | Freq: Four times a day (QID) | ORAL | Status: DC | PRN
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
## 2016-03-28 ENCOUNTER — Encounter

## 2016-03-28 MED ORDER — ATORVASTATIN 40 MG TAB
40 mg | ORAL_TABLET | Freq: Every day | ORAL | 1 refills | Status: DC
Start: 2016-03-28 — End: 2016-04-14

## 2016-03-28 NOTE — Telephone Encounter (Signed)
Pt requested refills, rx sent in and patient is aware

## 2016-04-07 ENCOUNTER — Encounter: Admit: 2016-04-07 | Payer: BLUE CROSS/BLUE SHIELD | Primary: Family Medicine

## 2016-04-07 DIAGNOSIS — Z79899 Other long term (current) drug therapy: Secondary | ICD-10-CM

## 2016-04-08 LAB — LIPID PANEL WITH LDL/HDL RATIO
Cholesterol, total: 145 mg/dL (ref 100–199)
HDL Cholesterol: 68 mg/dL (ref >39)
LDL, calculated: 64 mg/dL (ref 0–99)
LDL/HDL Ratio: 0.9 ratio units (ref 0.0–3.2)
Triglyceride: 64 mg/dL (ref 0–149)
VLDL, calculated: 13 mg/dL (ref 5–40)

## 2016-04-08 LAB — CBC WITH AUTOMATED DIFF
ABS. BASOPHILS: 0.1 10*3/uL (ref 0.0–0.2)
ABS. EOSINOPHILS: 0.1 10*3/uL (ref 0.0–0.4)
ABS. IMM. GRANS.: 0 10*3/uL (ref 0.0–0.1)
ABS. MONOCYTES: 0.4 10*3/uL (ref 0.1–0.9)
ABS. NEUTROPHILS: 1.5 10*3/uL (ref 1.4–7.0)
Abs Lymphocytes: 1.8 10*3/uL (ref 0.7–3.1)
BASOPHILS: 2 %
EOSINOPHILS: 3 %
HCT: 41.4 % (ref 34.0–46.6)
HGB: 13.6 g/dL (ref 11.1–15.9)
IMMATURE GRANULOCYTES: 0 %
Lymphocytes: 47 %
MCH: 31.4 pg (ref 26.6–33.0)
MCHC: 32.9 g/dL (ref 31.5–35.7)
MCV: 96 fL (ref 79–97)
MONOCYTES: 10 %
NEUTROPHILS: 38 %
PLATELET: 340 10*3/uL (ref 150–379)
RBC: 4.33 x10E6/uL (ref 3.77–5.28)
RDW: 13.6 % (ref 12.3–15.4)
WBC: 3.9 10*3/uL (ref 3.4–10.8)

## 2016-04-08 LAB — METABOLIC PANEL, COMPREHENSIVE
A-G Ratio: 1.6 (ref 1.2–2.2)
ALT (SGPT): 9 IU/L (ref 0–32)
AST (SGOT): 10 IU/L (ref 0–40)
Albumin: 4.3 g/dL (ref 3.5–4.8)
Alk. phosphatase: 48 IU/L (ref 39–117)
BUN/Creatinine ratio: 19 (ref 12–28)
BUN: 15 mg/dL (ref 8–27)
Bilirubin, total: 0.5 mg/dL (ref 0.0–1.2)
CO2: 24 mmol/L (ref 18–29)
Calcium: 9.6 mg/dL (ref 8.7–10.3)
Chloride: 101 mmol/L (ref 96–106)
Creatinine: 0.8 mg/dL (ref 0.57–1.00)
GFR est AA: 86 mL/min/{1.73_m2} (ref >59)
GFR est non-AA: 75 mL/min/{1.73_m2} (ref >59)
GLOBULIN, TOTAL: 2.7 g/dL (ref 1.5–4.5)
Glucose: 101 mg/dL — ABNORMAL HIGH (ref 65–99)
Potassium: 5 mmol/L (ref 3.5–5.2)
Protein, total: 7 g/dL (ref 6.0–8.5)
Sodium: 143 mmol/L (ref 134–144)

## 2016-04-14 ENCOUNTER — Ambulatory Visit
Admit: 2016-04-14 | Discharge: 2016-04-14 | Payer: BLUE CROSS/BLUE SHIELD | Attending: Family Medicine | Primary: Family Medicine

## 2016-04-14 DIAGNOSIS — E782 Mixed hyperlipidemia: Secondary | ICD-10-CM

## 2016-04-14 MED ORDER — ALENDRONATE 70 MG TAB
70 mg | ORAL_TABLET | ORAL | 5 refills | Status: DC
Start: 2016-04-14 — End: 2016-11-30

## 2016-04-14 MED ORDER — RIVAROXABAN 20 MG TAB
20 mg | ORAL_TABLET | Freq: Every day | ORAL | 5 refills | Status: DC
Start: 2016-04-14 — End: 2016-11-30

## 2016-04-14 MED ORDER — MONTELUKAST 10 MG TAB
10 mg | ORAL_TABLET | Freq: Every day | ORAL | 5 refills | Status: DC
Start: 2016-04-14 — End: 2017-01-04

## 2016-04-14 MED ORDER — ATORVASTATIN 40 MG TAB
40 mg | ORAL_TABLET | Freq: Every day | ORAL | 1 refills | Status: DC
Start: 2016-04-14 — End: 2016-06-29

## 2016-04-14 MED ORDER — ALBUTEROL SULFATE HFA 90 MCG/ACTUATION AEROSOL INHALER
90 mcg/actuation | RESPIRATORY_TRACT | 5 refills | Status: DC | PRN
Start: 2016-04-14 — End: 2017-01-04

## 2016-04-14 NOTE — Progress Notes (Signed)
[facility]  Anetra Czerwinski L. Britta Mccreedy, M.D.  [address]  [place], [place] [address]  Ph No:  [phone]  Fax:  [phone]    CHIEF COMPLAINT:  Chief Complaint   Patient presents with   ??? Labs     Go over results   ??? Asthma     Patient needs refills   ??? Allergic Rhinitis     Patient needs refills   ??? Cholesterol Problem     Patient needs refills   ??? Blood Clot     Patient needs refills          HISTORY OF PRESENT ILLNESS:  Pt here to review labs.  Has elevated fasting glucose.  No hx or symptoms of diabetes.  Has family hx of diabetes.  Blood pressure is low today but pt is asymptomatic.  No dizziness or chest pain.  No headaches.  No nausea vomiting or diarrhea.  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  Will be on xarelto indefinately. No side effects of medications.  Has hx of asthma.  Uses albuterol only intermittantly.  No night time symptoms.  No other complaints.    HISTORY:  Allergies   Allergen Reactions   ??? Codeine Unable to Obtain     Past Medical History:   Diagnosis Date   ??? Allergic rhinitis, cause unspecified 11/27/2012   ??? Contact dermatitis and other eczema, due to unspecified cause 11/27/2012   ??? Encounter for long-term (current) use of other medications 11/27/2012   ??? Long term (current) use of anticoagulants 11/27/2012   ??? Nonspecific abnormal electrocardiogram (ECG) (EKG) 11/27/2012   ??? Osteoporosis    ??? 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 ABDOMEN SURGERY PROC UNLISTED     ??? HX CHOLECYSTECTOMY     ??? HX GYN      Removed tubes   ??? VASCULAR SURGERY PROCEDURE UNLIST      Left leg striped the vein     Family History   Problem Relation Age of Onset   ??? Diabetes Mother    ??? Cancer Father    ??? Asthma Father    ??? Heart Disease Father    ??? Diabetes Sister    ??? Cancer Brother      Social History     Social History   ??? Marital status: MARRIED     Spouse name: N/A   ??? Number of children: N/A    ??? Years of education: N/A     Occupational History   ??? Not on file.     Social History Main Topics   ??? Smoking status: Never Smoker   ??? Smokeless tobacco: Never Used   ??? Alcohol use No   ??? Drug use: No   ??? Sexual activity: Not on file     Other Topics Concern   ??? Not on file     Social History Narrative     Current Outpatient Prescriptions   Medication Sig Dispense Refill   ??? atorvastatin (LIPITOR) 40 mg tablet Take 1 Tab by mouth daily. 30 Tab 1   ??? albuterol (PROVENTIL HFA, VENTOLIN HFA, PROAIR HFA) 90 mcg/actuation inhaler Take 2 Puffs by inhalation every four (4) hours as needed for Wheezing (FOR SOB). 1 Inhaler 5   ??? alendronate (FOSAMAX) 70 mg tablet Take 1 Tab by mouth every seven (7) days. 4 Tab 5   ???  montelukast (SINGULAIR) 10 mg tablet Take 1 Tab by mouth daily. 30 Tab 5   ??? rivaroxaban (XARELTO) 20 mg tab tablet Take 1 Tab by mouth daily (with dinner). 30 Tab 5   ??? nitroglycerin (NITROSTAT) 0.4 mg SL tablet by SubLINGual route every five (5) minutes as needed for Chest Pain.     ??? calcium-vitamin D (OYSTER SHELL CALCIUM-VIT D3) 250-125 mg-unit tablet Take 1 Tab by mouth daily.           REVIEW OF SYSTEMS:  ROS  Review of systems is as stated above, otherwise is negative.    PHYSICAL EXAM:  Vital Signs -   Visit Vitals   ??? BP 94/70 (BP 1 Location: Left arm, BP Patient Position: Sitting)   ??? Pulse 79   ??? Temp 98.4 ??F (36.9 ??C) (Oral)   ??? Ht 5\' 1"  (1.549 m)   ??? Wt 153 lb (69.4 kg)   ??? SpO2 97%   ??? BMI 28.91 kg/m2      Physical Exam   Constitutional: She is oriented to person, place, and time. She appears well-developed and well-nourished.   HENT:   Head: Normocephalic and atraumatic.   Eyes: Pupils are equal, round, and reactive to light.   Neck: Normal range of motion. Neck supple.   Cardiovascular: Normal rate, regular rhythm and normal heart sounds.    Pulmonary/Chest: Effort normal and breath sounds normal.   Musculoskeletal: Normal range of motion.    Neurological: She is alert and oriented to person, place, and time.   Psychiatric: She has a normal mood and affect. Her behavior is normal. Judgment and thought content normal.            LABS  Results for orders placed or performed in visit on 04/14/16   HEMOGLOBIN A1C W/O EAG   Result Value Ref Range    Hemoglobin A1c 5.9 (H) 4.8 - 5.6 %           IMPRESSION/PLAN      ICD-10-CM ICD-9-CM    1. 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 Mild intermittent asthma without complication J45.20 493.90 albuterol (PROVENTIL HFA, VENTOLIN HFA, PROAIR HFA) 90 mcg/actuation inhaler   4. Deep vein thrombosis (DVT) of proximal lower extremity, unspecified chronicity, unspecified laterality (HCC) I82.4Y9 453.41 rivaroxaban (XARELTO) 20 mg tab tablet   5. Environmental allergies Z91.09 V15.09 montelukast (SINGULAIR) 10 mg tablet   6. Impaired fasting glucose R73.01 790.21 HEMOGLOBIN A1C W/O EAG       Continue Atorvastatin 40mg .  Check A1C.  Limit carbohydrate intake.  Exercise for at least 30-45 mins daily.  Continue Xarelto for recurrent DVT.  Refilled albuterol and singulair.  Recheck in 6 months.    Norval Mortonollis L Ellia Knowlton, MD            Dictated using voice recognition software. Proofread, but unrecognized voice recognition errors may exist.

## 2016-04-14 NOTE — Patient Instructions (Signed)
Hemoglobin A1c: About This Test  What is it?  Hemoglobin A1c is a blood test that checks your average blood sugar level over the past 2 to 3 months. This test also is called a glycohemoglobin test or an A1c test.  Why is this test done?  The A1c test is done to check how well your diabetes has been controlled over the past 2 to 3 months. Your doctor can use this information to adjust your medicine and diabetes treatment, if needed.  How can you prepare for the test?  You do not need to stop eating before you have an A1c test. This test can be done at any time during the day, even after a meal.  What happens during the test?  The health professional taking a sample of your blood will:  ?? Wrap an elastic band around your upper arm. This makes the veins below the band larger so it is easier to put a needle into the vein.  ?? Clean the needle site with alcohol.  ?? Put the needle into the vein.  ?? Attach a tube to the needle to fill it with blood.  ?? Remove the band from your arm when enough blood is collected.  ?? Put a gauze pad or cotton ball over the needle site as the needle is removed.  ?? Put pressure on the site and then put on a bandage.  What else should you know about the test?  The test result is usually given as a percentage. The normal A1c is less than 5.7%.  The A1c test result also can be used to find your estimated average glucose, or eAG. Your eAG and A1c show the same thing in two different ways. They both help you learn more about your average blood sugar range over the past 2 to 3 months.  Where can you learn more?  Go to http://www.healthwise.net/[employer].  Enter U216 in the search box to learn more about "Hemoglobin A1c: About This Test."  Current as of: Apr 20, 2015  Content Version: 11.2  ?? 2006-2017 [employer]wise[employer]. Care instructions adapted under license by [employer] (which disclaims liability or warranty  for this information). If you have questions about a medical condition or this instruction, always ask your healthcare professional. Healthwise, Incorporated disclaims any warranty or liability for your use of this information.

## 2016-04-15 LAB — HEMOGLOBIN A1C W/O EAG: Hemoglobin A1c: 5.9 % — ABNORMAL HIGH (ref 4.8–5.6)

## 2016-04-18 NOTE — Progress Notes (Signed)
Tell patient that her A1c is 5.9.  She has prediabetes.  Tell her that diabetes is defined as A1c higher than 6.4.  We will recheck this again in 6 months.

## 2016-05-09 ENCOUNTER — Ambulatory Visit: Payer: Medicare Other | Attending: Internal Medicine

## 2016-05-11 ENCOUNTER — Institutional Professional Consult (permissible substitution): Admit: 2016-05-11 | Discharge: 2016-05-11 | Payer: BLUE CROSS/BLUE SHIELD | Primary: Family Medicine

## 2016-05-11 DIAGNOSIS — Z23 Encounter for immunization: Secondary | ICD-10-CM

## 2016-05-11 NOTE — Progress Notes (Signed)
Tonya Shaffer presents today for a Tdap injection. She is otherwise doing well and did not see the physician today. ABN waiver was signed in the event of non-payment from insurance.

## 2016-06-07 ENCOUNTER — Ambulatory Visit: Payer: Medicare Other

## 2016-06-21 ENCOUNTER — Ambulatory Visit: Payer: Medicare Other | Attending: Internal Medicine

## 2016-06-28 ENCOUNTER — Ambulatory Visit
Admission: RE | Admit: 2016-06-28 | Discharge: 2016-06-28 | Disposition: A | Discharge status: Home / Self Care | Payer: Medicare Other | Source: Ambulatory Visit | Attending: Internal Medicine | Admitting: Internal Medicine

## 2016-06-28 ENCOUNTER — Other Ambulatory Visit: Payer: Self-pay | Admitting: Internal Medicine

## 2016-06-28 ENCOUNTER — Encounter

## 2016-06-28 DIAGNOSIS — Z1231 Encounter for screening mammogram for malignant neoplasm of breast: Secondary | ICD-10-CM

## 2016-06-29 MED ORDER — ATORVASTATIN 40 MG TAB
40 mg | ORAL_TABLET | ORAL | 1 refills | Status: DC
Start: 2016-06-29 — End: 2016-11-30

## 2016-10-06 ENCOUNTER — Encounter: Primary: Family Medicine

## 2016-10-12 ENCOUNTER — Encounter: Attending: Family Medicine | Primary: Family Medicine

## 2016-10-25 ENCOUNTER — Encounter: Payer: Self-pay | Admitting: *Deleted

## 2016-10-26 ENCOUNTER — Ambulatory Visit: Payer: Medicare Other | Admitting: Anesthesiology

## 2016-10-26 ENCOUNTER — Encounter
Admission: RE | Disposition: A | Discharge status: Home / Self Care | Payer: Self-pay | Source: Ambulatory Visit | Attending: Unknown Physician Specialty

## 2016-10-26 ENCOUNTER — Ambulatory Visit
Admission: RE | Admit: 2016-10-26 | Discharge: 2016-10-26 | Disposition: A | Discharge status: Home / Self Care | Payer: Medicare Other | Source: Ambulatory Visit | Attending: Unknown Physician Specialty | Admitting: Unknown Physician Specialty

## 2016-10-26 ENCOUNTER — Encounter: Payer: Self-pay | Admitting: *Deleted

## 2016-10-26 DIAGNOSIS — K5730 Diverticulosis of large intestine without perforation or abscess without bleeding: Secondary | ICD-10-CM | POA: Diagnosis not present

## 2016-10-26 DIAGNOSIS — Z1211 Encounter for screening for malignant neoplasm of colon: Secondary | ICD-10-CM | POA: Insufficient documentation

## 2016-10-26 DIAGNOSIS — Z79899 Other long term (current) drug therapy: Secondary | ICD-10-CM | POA: Insufficient documentation

## 2016-10-26 DIAGNOSIS — Z7984 Long term (current) use of oral hypoglycemic drugs: Secondary | ICD-10-CM | POA: Diagnosis not present

## 2016-10-26 DIAGNOSIS — K589 Irritable bowel syndrome without diarrhea: Secondary | ICD-10-CM | POA: Diagnosis not present

## 2016-10-26 DIAGNOSIS — Z86010 Personal history of colonic polyps: Secondary | ICD-10-CM | POA: Insufficient documentation

## 2016-10-26 DIAGNOSIS — K640 First degree hemorrhoids: Secondary | ICD-10-CM | POA: Insufficient documentation

## 2016-10-26 DIAGNOSIS — D120 Benign neoplasm of cecum: Secondary | ICD-10-CM | POA: Insufficient documentation

## 2016-10-26 DIAGNOSIS — Z955 Presence of coronary angioplasty implant and graft: Secondary | ICD-10-CM | POA: Insufficient documentation

## 2016-10-26 DIAGNOSIS — E785 Hyperlipidemia, unspecified: Secondary | ICD-10-CM | POA: Diagnosis not present

## 2016-10-26 DIAGNOSIS — E039 Hypothyroidism, unspecified: Secondary | ICD-10-CM | POA: Diagnosis not present

## 2016-10-26 DIAGNOSIS — K219 Gastro-esophageal reflux disease without esophagitis: Secondary | ICD-10-CM | POA: Insufficient documentation

## 2016-10-26 DIAGNOSIS — I10 Essential (primary) hypertension: Secondary | ICD-10-CM | POA: Diagnosis not present

## 2016-10-26 DIAGNOSIS — E119 Type 2 diabetes mellitus without complications: Secondary | ICD-10-CM | POA: Diagnosis not present

## 2016-10-26 HISTORY — PX: COLONOSCOPY WITH PROPOFOL: SHX5780

## 2016-10-26 HISTORY — DX: Hypothyroidism, unspecified: E03.9

## 2016-10-26 HISTORY — DX: Irritable bowel syndrome, unspecified: K58.9

## 2016-10-26 HISTORY — DX: Hyperlipidemia, unspecified: E78.5

## 2016-10-26 LAB — GLUCOSE, CAPILLARY: GLUCOSE-CAPILLARY: 156 mg/dL — AB (ref 65–99)

## 2016-10-26 SURGERY — COLONOSCOPY WITH PROPOFOL
Anesthesia: General

## 2016-10-26 MED ORDER — LIDOCAINE 2% (20 MG/ML) 5 ML SYRINGE
INTRAMUSCULAR | Status: DC | PRN
  Administered 2016-10-26: 40 mg via INTRAVENOUS

## 2016-10-26 MED ORDER — PHENYLEPHRINE HCL 10 MG/ML IJ SOLN
INTRAMUSCULAR | Status: DC | PRN
  Administered 2016-10-26 (×7): 100 ug via INTRAVENOUS

## 2016-10-26 MED ORDER — FENTANYL CITRATE (PF) 100 MCG/2ML IJ SOLN
INTRAMUSCULAR | Status: DC | PRN
  Administered 2016-10-26: 50 ug via INTRAVENOUS

## 2016-10-26 MED ORDER — SODIUM CHLORIDE 0.9 % IV SOLN: INTRAVENOUS | Status: DC

## 2016-10-26 MED ORDER — MIDAZOLAM HCL 5 MG/5ML IJ SOLN
INTRAMUSCULAR | Status: DC | PRN
  Administered 2016-10-26: 1 mg via INTRAVENOUS

## 2016-10-26 MED ORDER — PROPOFOL 10 MG/ML IV BOLUS
INTRAVENOUS | Status: DC | PRN
  Administered 2016-10-26: 100 mg via INTRAVENOUS

## 2016-10-26 MED ORDER — SODIUM CHLORIDE 0.9 % IV SOLN
INTRAVENOUS | Status: DC
  Administered 2016-10-26: 09:00:00 via INTRAVENOUS

## 2016-10-26 MED ORDER — PROPOFOL 500 MG/50ML IV EMUL
INTRAVENOUS | Status: DC | PRN
  Administered 2016-10-26: 140 ug/kg/min via INTRAVENOUS

## 2016-10-26 NOTE — Transfer of Care (Signed)
Immediate Anesthesia Transfer of Care Note  Patient: Sarah [name]  Procedure(s) Performed: Procedure(s): COLONOSCOPY WITH PROPOFOL (N/A)  Patient Location: PACU and Endoscopy Unit  Anesthesia Type:General  Level of Consciousness: sedated  Airway & Oxygen Therapy: Patient Spontanous Breathing and Patient connected to nasal cannula oxygen  Post-op Assessment: Report given to RN and Post -op Vital signs reviewed and stable  Post vital signs: Reviewed and stable  Last Vitals:  Vitals:   10/26/16 0845  BP: 132/60  Pulse: 87  Resp: 18  Temp: 36.9 C    Last Pain:  Vitals:   10/26/16 0845  TempSrc: Oral         Complications: No apparent anesthesia complications

## 2016-10-26 NOTE — Anesthesia Preprocedure Evaluation (Signed)
Anesthesia Evaluation  Patient identified by MRN, date of birth, ID band Patient awake    Reviewed: Allergy & Precautions, H&P , NPO status , Patient's Chart, lab work & pertinent test results, reviewed documented beta blocker date and time   History of Anesthesia Complications Negative for: history of anesthetic complications  Airway Mallampati: I  TM Distance: >3 FB Neck ROM: full    Dental  (+) Poor Dentition, Missing, Chipped   Pulmonary neg pulmonary ROS,           Cardiovascular Exercise Tolerance: Good hypertension, On Medications (-) angina(-) CAD, (-) Past MI, (-) Cardiac Stents and (-) CABG (-) dysrhythmias (-) Valvular Problems/Murmurs     Neuro/Psych negative neurological ROS  negative psych ROS   GI/Hepatic Neg liver ROS, GERD  ,  Endo/Other  diabetes, Well Controlled, Type 2, Oral Hypoglycemic AgentsHypothyroidism   Renal/GU negative Renal ROS  negative genitourinary   Musculoskeletal   Abdominal   Peds  Hematology negative hematology ROS (+)   Anesthesia Other Findings Past Medical History: No date: Cancer ([place])     Comment: thyroid- radiation No date: Diabetes mellitus without complication (HCC) No date: Hyperlipidemia No date: Hypertension No date: Hypothyroidism No date: IBS (irritable bowel syndrome)   Reproductive/Obstetrics negative OB ROS                             Anesthesia Physical Anesthesia Plan  ASA: II  Anesthesia Plan: General   Post-op Pain Management:    Induction:   Airway Management Planned:   Additional Equipment:   Intra-op Plan:   Post-operative Plan:   Informed Consent: I have reviewed the patients History and Physical, chart, labs and discussed the procedure including the risks, benefits and alternatives for the proposed anesthesia with the patient or authorized representative who has indicated his/her understanding and acceptance.    Dental Advisory Given  Plan Discussed with: Anesthesiologist, CRNA and Surgeon  Anesthesia Plan Comments:         Anesthesia Quick Evaluation

## 2016-10-26 NOTE — H&P (Signed)
   Primary Care Physician:  Rusty Aus, MD Primary Gastroenterologist:  Dr. Vira Agar  Pre-Procedure History & Physical: HPI:  Sarah [name] is a 70 y.o. female is here for an colonoscopy.   Past Medical History:  Diagnosis Date  . Cancer [facility])    thyroid- radiation  . Diabetes mellitus without complication ([place])   . Hyperlipidemia   . Hypertension   . Hypothyroidism   . IBS (irritable bowel syndrome)     Past Surgical History:  Procedure Laterality Date  . ABDOMINAL HYSTERECTOMY    . CHOLECYSTECTOMY    . KNEE SURGERY      Prior to Admission medications   Medication Sig Start Date End Date Taking? Authorizing Provider  Levothyroxine Sodium (SYNTHROID PO) Take by mouth.   Yes Historical Provider, MD  diazepam (VALIUM) 2 MG tablet Take 1 tablet (2 mg total) by mouth every 8 (eight) hours as needed for anxiety. 12/28/15 12/27/16  Pierce Crane Beers, PA-C  fosfomycin (MONUROL) 3 G PACK Take 3 g by mouth once. Please mix in 8 oz of water, take by mouth once Patient not taking: Reported on 10/26/2016 06/21/15   Delman Kitten, MD  ibuprofen (ADVIL,MOTRIN) 600 MG tablet Take 1 tablet (600 mg total) by mouth every 6 (six) hours as needed. 12/28/15   Arlyss Repress, PA-C  metFORMIN (GLUCOPHAGE) 500 MG tablet Take 500 mg by mouth 2 (two) times daily with a meal.    Historical Provider, MD    Allergies as of 09/09/2016 - Review Complete 12/28/2015  Allergen Reaction Noted  . Penicillins Anaphylaxis 06/21/2015    Family History  Problem Relation Age of Onset  . Breast cancer Neg Hx     Social History   Social History  . Marital status: Married    Spouse name: N/A  . Number of children: N/A  . Years of education: N/A   Occupational History  . Not on file.   Social History Main Topics  . Smoking status: Never Smoker  . Smokeless tobacco: Never Used  . Alcohol use No  . Drug use: No  . Sexual activity: Not on file   Other Topics Concern  . Not on file   Social History  Narrative  . No narrative on file    Review of Systems: See HPI, otherwise negative ROS  Physical Exam: BP 132/60   Pulse 87   Temp 98.4 F (36.9 C) (Oral)   Resp 18   Ht 5\' 3"  (1.6 m)   Wt 79.4 kg (175 lb)   SpO2 98%   BMI 31.00 kg/m  General:   Alert,  pleasant and cooperative in NAD Head:  Normocephalic and atraumatic. Neck:  Supple; no masses or thyromegaly. Lungs:  Clear throughout to auscultation.    Heart:  Regular rate and rhythm. Abdomen:  Soft, nontender and nondistended. Normal bowel sounds, without guarding, and without rebound.   Neurologic:  Alert and  oriented x4;  grossly normal neurologically.  Impression/Plan: [name] is here for an colonoscopy to be performed for [facility] colon polyps  Risks, benefits, limitations, and alternatives regarding  colonoscopy have been reviewed with the patient.  Questions have been answered.  All parties agreeable.   Gaylyn Cheers, MD  10/26/2016, 9:42 AM

## 2016-10-26 NOTE — Op Note (Signed)
[facility]enter[facility] Patient Name: Sarah [name] Procedure Date: 10/26/2016 9:37 AM MRN: [id number] Account #: [id number] Date of Birth: [birth year] Admit Type: Outpatient Age: 69 Room: [facility] ENDO ROOM 4 Gender: Female Note Status: Finalized Procedure:            Colonoscopy Indications:          High risk colon cancer surveillance: Personal history                        of colonic polyps Providers:            Manya Silvas, MD Referring MD:         Rusty Aus, MD (Referring MD) Medicines:            Propofol per Anesthesia Complications:        No immediate complications. Procedure:            Pre-Anesthesia Assessment:                       - After reviewing the risks and benefits, the patient                        was deemed in satisfactory condition to undergo the                        procedure.                       After obtaining informed consent, the colonoscope was                        passed under direct vision. Throughout the procedure,                        the patient's blood pressure, pulse, and oxygen                        saturations were monitored continuously. The                        Colonoscope was introduced through the anus and                        advanced to the the cecum, identified by appendiceal                        orifice and ileocecal valve. The colonoscopy was                        somewhat difficult due to a tortuous colon. Successful                        completion of the procedure was aided by applying                        abdominal pressure. The patient tolerated the procedure                        well. The quality of the bowel preparation was adequate  to identify polyps. Findings:      A small polyp was found in the cecum. The polyp was sessile. The polyp       was removed with a hot snare. Resection and retrieval were complete.      Many small-mouthed diverticula were  found in the sigmoid colon and       descending colon.      Internal hemorrhoids were found during endoscopy. The hemorrhoids were       small and Grade I (internal hemorrhoids that do not prolapse).      The exam was otherwise without abnormality. Impression:           - One small polyp in the cecum, removed with a hot                        snare. Resected and retrieved.                       - Diverticulosis in the sigmoid colon and in the                        descending colon.                       - Internal hemorrhoids.                       - The examination was otherwise normal. Recommendation:       - Await pathology results. Manya Silvas, MD 10/26/2016 10:30:07 AM This report has been signed electronically. Number of Addenda: 0 Note Initiated On: 10/26/2016 9:37 AM Scope Withdrawal Time: 0 hours 22 minutes 19 seconds  Total Procedure Duration: 0 hours 32 minutes 47 seconds       [facility]

## 2016-10-27 ENCOUNTER — Encounter: Payer: Self-pay | Admitting: Unknown Physician Specialty

## 2016-10-27 LAB — SURGICAL PATHOLOGY

## 2016-10-28 NOTE — Anesthesia Postprocedure Evaluation (Signed)
Anesthesia Post Note  Patient: Sarah [name]  Procedure(s) Performed: Procedure(s) (LRB): COLONOSCOPY WITH PROPOFOL (N/A)  Patient location during evaluation: Endoscopy Anesthesia Type: General Level of consciousness: awake and alert Pain management: pain level controlled Vital Signs Assessment: post-procedure vital signs reviewed and stable Respiratory status: spontaneous breathing, nonlabored ventilation, respiratory function stable and patient connected to nasal cannula oxygen Cardiovascular status: blood pressure returned to baseline and stable Postop Assessment: no signs of nausea or vomiting Anesthetic complications: no    Last Vitals:  Vitals:   10/26/16 1032 10/26/16 1050  BP: (!) 91/39 98/84  Pulse: 66   Resp: 19   Temp: 36.3 C     Last Pain:  Vitals:   10/27/16 1103  TempSrc:   PainSc: 0-No pain                 Martha Clan

## 2016-11-30 ENCOUNTER — Encounter

## 2016-11-30 MED ORDER — RIVAROXABAN 20 MG TAB
20 mg | ORAL_TABLET | Freq: Every day | ORAL | 1 refills | Status: DC
Start: 2016-11-30 — End: 2017-01-04

## 2016-11-30 MED ORDER — ALENDRONATE 70 MG TAB
70 mg | ORAL_TABLET | ORAL | 1 refills | Status: DC
Start: 2016-11-30 — End: 2017-01-04

## 2016-11-30 MED ORDER — ATORVASTATIN 40 MG TAB
40 mg | ORAL_TABLET | ORAL | 1 refills | Status: DC
Start: 2016-11-30 — End: 2017-01-04

## 2016-11-30 NOTE — Telephone Encounter (Signed)
Pt requested refills, ok per Dr Barksdale via protocol, rx sent in and patient is aware

## 2016-12-13 ENCOUNTER — Encounter: Payer: Self-pay | Admitting: *Deleted

## 2016-12-16 NOTE — Discharge Instructions (Signed)
Cataract Surgery, Care After °Refer to this sheet in the next few weeks. These instructions provide you with information about caring for yourself after your procedure. Your health care provider may also give you more specific instructions. Your treatment has been planned according to current medical practices, but problems sometimes occur. Call your health care provider if you have any problems or questions after your procedure. °What can I expect after the procedure? °After the procedure, it is common to have: °· Itching. °· Discomfort. °· Fluid discharge. °· Sensitivity to light and to touch. °· Bruising. °Follow these instructions at home: °Eye Care  °· Check your eye every day for signs of infection. Watch for: °¨ Redness, swelling, or pain. °¨ Fluid, blood, or pus. °¨ Warmth. °¨ Bad smell. °Activity  °· Avoid strenuous activities, such as playing contact sports, for as long as told by your health care provider. °· Do not drive or operate heavy machinery until your health care provider approves. °· Do not bend or lift heavy objects . Bending increases pressure in the eye. You can walk, climb stairs, and do light household chores. °· Ask your health care provider when you can return to work. If you work in a dusty environment, you may be advised to wear protective eyewear for a period of time. °General instructions  °· Take or apply over-the-counter and prescription medicines only as told by your health care provider. This includes eye drops. °· Do not touch or rub your eyes. °· If you were given a protective shield, wear it as told by your health care provider. If you were not given a protective shield, wear sunglasses as told by your health care provider to protect your eyes. °· Keep the area around your eye clean and dry. Avoid swimming or allowing water to hit you directly in the face while showering until told by your health care provider. Keep soap and shampoo out of your eyes. °· Do not put a contact lens  into the affected eye or eyes until your health care provider approves. °· Keep all follow-up visits as told by your health care provider. This is important. °Contact a health care provider if: ° °· You have increased bruising around your eye. °· You have pain that is not helped with medicine. °· You have a fever. °· You have redness, swelling, or pain in your eye. °· You have fluid, blood, or pus coming from your incision. °· Your vision gets worse. °Get help right away if: °· You have sudden vision loss. °This information is not intended to replace advice given to you by your health care provider. Make sure you discuss any questions you have with your health care provider. °Document Released: 06/03/2005 Document Revised: 03/24/2016 Document Reviewed: 09/24/2015 °Elsevier Interactive Patient Education © 2017 [employer] Inc[employer] ° ° ° ° °General Anesthesia, Adult, Care After °These instructions provide you with information about caring for yourself after your procedure. Your health care provider may also give you more specific instructions. Your treatment has been planned according to current medical practices, but problems sometimes occur. Call your health care provider if you have any problems or questions after your procedure. °What can I expect after the procedure? °After the procedure, it is common to have: °· Vomiting. °· A sore throat. °· Mental slowness. °It is common to feel: °· Nauseous. °· Cold or shivery. °· Sleepy. °· Tired. °· Sore or achy, even in parts of your body where you did not have surgery. °Follow these instructions at   home: °For at least 24 hours after the procedure:  °· Do not: °¨ Participate in activities where you could fall or become injured. °¨ Drive. °¨ Use heavy machinery. °¨ Drink alcohol. °¨ Take sleeping pills or medicines that cause drowsiness. °¨ Make important decisions or sign legal documents. °¨ Take care of children on your own. °· Rest. °Eating and drinking  °· If you vomit, drink  water, juice, or soup when you can drink without vomiting. °· Drink enough fluid to keep your urine clear or pale yellow. °· Make sure you have little or no nausea before eating solid foods. °· Follow the diet recommended by your health care provider. °General instructions  °· Have a responsible adult stay with you until you are awake and alert. °· Return to your normal activities as told by your health care provider. Ask your health care provider what activities are safe for you. °· Take over-the-counter and prescription medicines only as told by your health care provider. °· If you smoke, do not smoke without supervision. °· Keep all follow-up visits as told by your health care provider. This is important. °Contact a health care provider if: °· You continue to have nausea or vomiting at home, and medicines are not helpful. °· You cannot drink fluids or start eating again. °· You cannot urinate after 8-12 hours. °· You develop a skin rash. °· You have fever. °· You have increasing redness at the site of your procedure. °Get help right away if: °· You have difficulty breathing. °· You have chest pain. °· You have unexpected bleeding. °· You feel that you are having a life-threatening or urgent problem. °This information is not intended to replace advice given to you by your health care provider. Make sure you discuss any questions you have with your health care provider. °Document Released: 02/20/2001 Document Revised: 04/18/2016 Document Reviewed: 10/29/2015 °Elsevier Interactive Patient Education © 2017 Elsevier Inc[employer] ° °

## 2016-12-21 ENCOUNTER — Ambulatory Visit
Admission: RE | Admit: 2016-12-21 | Discharge: 2016-12-21 | Disposition: A | Discharge status: Home / Self Care | Payer: Medicare Other | Source: Ambulatory Visit | Attending: Ophthalmology | Admitting: Ophthalmology

## 2016-12-21 ENCOUNTER — Ambulatory Visit: Payer: Medicare Other | Admitting: Anesthesiology

## 2016-12-21 ENCOUNTER — Encounter
Admission: RE | Disposition: A | Discharge status: Home / Self Care | Payer: Self-pay | Source: Ambulatory Visit | Attending: Ophthalmology

## 2016-12-21 DIAGNOSIS — Z79899 Other long term (current) drug therapy: Secondary | ICD-10-CM | POA: Insufficient documentation

## 2016-12-21 DIAGNOSIS — E1136 Type 2 diabetes mellitus with diabetic cataract: Secondary | ICD-10-CM | POA: Insufficient documentation

## 2016-12-21 DIAGNOSIS — I10 Essential (primary) hypertension: Secondary | ICD-10-CM | POA: Diagnosis not present

## 2016-12-21 HISTORY — PX: CATARACT EXTRACTION W/PHACO: SHX586

## 2016-12-21 HISTORY — DX: Unspecified osteoarthritis, unspecified site: M19.90

## 2016-12-21 LAB — GLUCOSE, CAPILLARY
Glucose-Capillary: 153 mg/dL — ABNORMAL HIGH (ref 65–99)
Glucose-Capillary: 147 mg/dL — ABNORMAL HIGH (ref 65–99)

## 2016-12-21 SURGERY — PHACOEMULSIFICATION, CATARACT, WITH IOL INSERTION
Anesthesia: Monitor Anesthesia Care | Site: Eye | Laterality: Left | Wound class: Clean

## 2016-12-21 MED ORDER — LIDOCAINE HCL (PF) 4 % IJ SOLN
INTRAMUSCULAR | Status: DC | PRN
  Administered 2016-12-21: 2 mL via OPHTHALMIC

## 2016-12-21 MED ORDER — NA HYALUR & NA CHOND-NA HYALUR 0.4-0.35 ML IO KIT
PACK | INTRAOCULAR | Status: DC | PRN
  Administered 2016-12-21: 1 mL via INTRAOCULAR

## 2016-12-21 MED ORDER — EPINEPHRINE PF 1 MG/ML IJ SOLN
INTRAOCULAR | Status: DC | PRN
  Administered 2016-12-21: 51 mL via OPHTHALMIC

## 2016-12-21 MED ORDER — ARMC OPHTHALMIC DILATING DROPS
1.0000 "application " | OPHTHALMIC | Status: DC | PRN
  Administered 2016-12-21 (×3): 1 via OPHTHALMIC

## 2016-12-21 MED ORDER — BRIMONIDINE TARTRATE-TIMOLOL 0.2-0.5 % OP SOLN
OPHTHALMIC | Status: DC | PRN
  Administered 2016-12-21: 1 [drp] via OPHTHALMIC

## 2016-12-21 MED ORDER — LACTATED RINGERS IV SOLN: INTRAVENOUS | Status: DC

## 2016-12-21 MED ORDER — MOXIFLOXACIN HCL 0.5 % OP SOLN
1.0000 [drp] | OPHTHALMIC | Status: DC | PRN
  Administered 2016-12-21 (×3): 1 [drp] via OPHTHALMIC

## 2016-12-21 MED ORDER — FENTANYL CITRATE (PF) 100 MCG/2ML IJ SOLN
INTRAMUSCULAR | Status: DC | PRN
  Administered 2016-12-21: 50 ug via INTRAVENOUS

## 2016-12-21 MED ORDER — MIDAZOLAM HCL 2 MG/2ML IJ SOLN
INTRAMUSCULAR | Status: DC | PRN
  Administered 2016-12-21: 2 mg via INTRAVENOUS

## 2016-12-21 MED ORDER — MOXIFLOXACIN HCL 0.5 % OP SOLN
OPHTHALMIC | Status: DC | PRN
  Administered 2016-12-21: .3 mL via OPHTHALMIC

## 2016-12-21 SURGICAL SUPPLY — 25 items
CANNULA ANT/CHMB 27GA (MISCELLANEOUS) ×3 IMPLANT
CARTRIDGE ABBOTT (MISCELLANEOUS) IMPLANT
GLOVE SURG LX 7.5 STRW (GLOVE) ×2
GLOVE SURG LX STRL 7.5 STRW (GLOVE) ×1 IMPLANT
GLOVE SURG TRIUMPH 8.0 PF LTX (GLOVE) ×3 IMPLANT
GOWN STRL REUS W/ TWL LRG LVL3 (GOWN DISPOSABLE) ×2 IMPLANT
GOWN STRL REUS W/TWL LRG LVL3 (GOWN DISPOSABLE) ×4
LENS IOL TECNIS ITEC 21.5 (Intraocular Lens) ×3 IMPLANT
MARKER SKIN DUAL TIP RULER LAB (MISCELLANEOUS) ×3 IMPLANT
NDL RETROBULBAR .5 NSTRL (NEEDLE) IMPLANT
NEEDLE FILTER BLUNT 18X 1/2SAF (NEEDLE) ×2
NEEDLE FILTER BLUNT 18X1 1/2 (NEEDLE) ×1 IMPLANT
PACK CATARACT BRASINGTON (MISCELLANEOUS) ×3 IMPLANT
PACK EYE AFTER SURG (MISCELLANEOUS) ×3 IMPLANT
PACK OPTHALMIC (MISCELLANEOUS) ×3 IMPLANT
RING MALYGIN 7.0 (MISCELLANEOUS) IMPLANT
SUT ETHILON 10-0 CS-B-6CS-B-6 (SUTURE)
SUT VICRYL  9 0 (SUTURE)
SUT VICRYL 9 0 (SUTURE) IMPLANT
SUTURE EHLN 10-0 CS-B-6CS-B-6 (SUTURE) IMPLANT
SYR 3ML LL SCALE MARK (SYRINGE) ×3 IMPLANT
SYR 5ML LL (SYRINGE) ×3 IMPLANT
SYR TB 1ML LUER SLIP (SYRINGE) ×3 IMPLANT
WATER STERILE IRR 250ML POUR (IV SOLUTION) ×3 IMPLANT
WIPE NON LINTING 3.25X3.25 (MISCELLANEOUS) ×3 IMPLANT

## 2016-12-21 NOTE — Anesthesia Postprocedure Evaluation (Signed)
Anesthesia Post Note  Patient: Sarah [name]  Procedure(s) Performed: Procedure(s) (LRB): CATARACT EXTRACTION PHACO AND INTRAOCULAR LENS PLACEMENT (I[facility])  left eye (Left)  Patient location during evaluation: PACU Anesthesia Type: MAC Level of consciousness: awake Pain management: pain level controlled Vital Signs Assessment: post-procedure vital signs reviewed and stable Respiratory status: spontaneous breathing Cardiovascular status: blood pressure returned to baseline Postop Assessment: no headache Anesthetic complications: no    Jaci Standard, III,  Smitty Ackerley D

## 2016-12-21 NOTE — Anesthesia Preprocedure Evaluation (Signed)
Anesthesia Evaluation  Patient identified by MRN, date of birth, ID band Patient awake    Reviewed: Allergy & Precautions, H&P , NPO status , Patient's Chart, lab work & pertinent test results  Airway Mallampati: II  TM Distance: >3 FB Neck ROM: full    Dental   Pulmonary neg pulmonary ROS,    Pulmonary exam normal        Cardiovascular hypertension, On Medications Normal cardiovascular exam     Neuro/Psych    GI/Hepatic negative GI ROS, Neg liver ROS,   Endo/Other  diabetes, Well Controlled, Type 2  Renal/GU      Musculoskeletal   Abdominal   Peds  Hematology negative hematology ROS (+)   Anesthesia Other Findings   Reproductive/Obstetrics negative OB ROS                             Anesthesia Physical Anesthesia Plan  ASA: II  Anesthesia Plan: MAC   Post-op Pain Management:    Induction:   Airway Management Planned:   Additional Equipment:   Intra-op Plan:   Post-operative Plan:   Informed Consent: I have reviewed the patients History and Physical, chart, labs and discussed the procedure including the risks, benefits and alternatives for the proposed anesthesia with the patient or authorized representative who has indicated his/her understanding and acceptance.     Plan Discussed with:   Anesthesia Plan Comments:         Anesthesia Quick Evaluation

## 2016-12-21 NOTE — Transfer of Care (Signed)
Immediate Anesthesia Transfer of Care Note  Patient: Sarah [name]  Procedure(s) Performed: Procedure(s) with comments: CATARACT EXTRACTION PHACO AND INTRAOCULAR LENS PLACEMENT (IOC)  left eye (Left) - Diabetic - oral meds  Patient Location: PACU  Anesthesia Type: MAC  Level of Consciousness: awake, alert  and patient cooperative  Airway and Oxygen Therapy: Patient Spontanous Breathing and Patient connected to supplemental oxygen  Post-op Assessment: Post-op Vital signs reviewed, Patient's Cardiovascular Status Stable, Respiratory Function Stable, Patent Airway and No signs of Nausea or vomiting  Post-op Vital Signs: Reviewed and stable  Complications: No apparent anesthesia complications

## 2016-12-21 NOTE — Anesthesia Procedure Notes (Signed)
Procedure Name: MAC Performed by: Akshith Moncus Pre-anesthesia Checklist: Patient identified, Emergency Drugs available, Suction available, Timeout performed and Patient being monitored Patient Re-evaluated:Patient Re-evaluated prior to inductionOxygen Delivery Method: Nasal cannula Placement Confirmation: positive ETCO2     

## 2016-12-21 NOTE — H&P (Signed)
The History and Physical notes are on paper, have been signed, and are to be scanned. The patient remains stable and unchanged from the H&P.   Previous H&P reviewed, patient examined, and there are no changes.  Kellsie Grindle 12/21/2016 7:40 AM

## 2016-12-21 NOTE — Op Note (Signed)
OPERATIVE NOTE  Sarah [name] [id number] 12/21/2016   PREOPERATIVE DIAGNOSIS:  Nuclear sclerotic cataract left eye. H25.12   POSTOPERATIVE DIAGNOSIS:    Nuclear sclerotic cataract left eye.     PROCEDURE:  Phacoemusification with posterior chamber intraocular lens placement of the left eye   LENS:   Implant Name Type Inv. Item Serial No. Manufacturer Lot No. LRB No. Used  LENS IOL DIOP 21.5 - [id number] Intraocular Lens LENS IOL DIOP 21.5 [id number]   Left 1        ULTRASOUND TIME: 14  % of 0 minutes 50 seconds, CDE 6.9  SURGEON:  Wyonia Hough, MD   ANESTHESIA:  Topical with tetracaine drops and 2% Xylocaine jelly, augmented with 1% preservative-free intracameral lidocaine.    COMPLICATIONS:  None.   DESCRIPTION OF PROCEDURE:  The patient was identified in the holding room and transported to the operating room and placed in the supine position under the operating microscope.  The left eye was identified as the operative eye and it was prepped and draped in the usual sterile ophthalmic fashion.   A 1 millimeter clear-corneal paracentesis was made at the 1:30 position.  0.5 ml of preservative-free 1% lidocaine was injected into the anterior chamber.  The anterior chamber was filled with Viscoat viscoelastic.  A 2.4 millimeter keratome was used to make a near-clear corneal incision at the 10:30 position.  .  A curvilinear capsulorrhexis was made with a cystotome and capsulorrhexis forceps.  Balanced salt solution was used to hydrodissect and hydrodelineate the nucleus.   Phacoemulsification was then used in stop and chop fashion to remove the lens nucleus and epinucleus.  The remaining cortex was then removed using the irrigation and aspiration handpiece. Provisc was then placed into the capsular bag to distend it for lens placement.  A lens was then injected into the capsular bag.  The remaining viscoelastic was aspirated.   Wounds were hydrated with balanced salt solution.   The anterior chamber was inflated to a physiologic pressure with balanced salt solution.  No wound leaks were noted. Vigamox 0.2 ml of a 1mg  per ml solution was injected into the anterior chamber for a dose of 0.2 mg of intracameral antibiotic at the completion of the case.   Timolol and Brimonidine drops were applied to the eye.  The patient was taken to the recovery room in stable condition without complications of anesthesia or surgery.  Sarah [name] 12/21/2016, 8:04 AM

## 2016-12-22 ENCOUNTER — Encounter: Payer: Self-pay | Admitting: Ophthalmology

## 2016-12-28 ENCOUNTER — Encounter: Admit: 2016-12-28 | Discharge: 2016-12-28 | Payer: BLUE CROSS/BLUE SHIELD | Primary: Family Medicine

## 2016-12-28 DIAGNOSIS — Z79899 Other long term (current) drug therapy: Secondary | ICD-10-CM

## 2016-12-29 LAB — CBC WITH AUTOMATED DIFF
ABS. BASOPHILS: 0.1 10*3/uL (ref 0.0–0.2)
ABS. EOSINOPHILS: 0.1 10*3/uL (ref 0.0–0.4)
ABS. IMM. GRANS.: 0 10*3/uL (ref 0.0–0.1)
ABS. MONOCYTES: 0.4 10*3/uL (ref 0.1–0.9)
ABS. NEUTROPHILS: 1.2 10*3/uL — ABNORMAL LOW (ref 1.4–7.0)
Abs Lymphocytes: 1.7 10*3/uL (ref 0.7–3.1)
BASOPHILS: 2 %
EOSINOPHILS: 4 %
HCT: 41.6 % (ref 34.0–46.6)
HGB: 13.5 g/dL (ref 11.1–15.9)
IMMATURE GRANULOCYTES: 0 %
Lymphocytes: 47 %
MCH: 30.5 pg (ref 26.6–33.0)
MCHC: 32.5 g/dL (ref 31.5–35.7)
MCV: 94 fL (ref 79–97)
MONOCYTES: 12 %
NEUTROPHILS: 35 %
PLATELET: 324 10*3/uL (ref 150–379)
RBC: 4.42 x10E6/uL (ref 3.77–5.28)
RDW: 14.2 % (ref 12.3–15.4)
WBC: 3.5 10*3/uL (ref 3.4–10.8)

## 2016-12-29 LAB — LIPID PANEL WITH LDL/HDL RATIO
Cholesterol, total: 204 mg/dL — ABNORMAL HIGH (ref 100–199)
HDL Cholesterol: 66 mg/dL (ref >39)
LDL, calculated: 123 mg/dL — ABNORMAL HIGH (ref 0–99)
LDL/HDL Ratio: 1.9 ratio units (ref 0.0–3.2)
Triglyceride: 76 mg/dL (ref 0–149)
VLDL, calculated: 15 mg/dL (ref 5–40)

## 2016-12-29 LAB — METABOLIC PANEL, COMPREHENSIVE
A-G Ratio: 1.6 (ref 1.2–2.2)
ALT (SGPT): 7 IU/L (ref 0–32)
AST (SGOT): 12 IU/L (ref 0–40)
Albumin: 4.3 g/dL (ref 3.5–4.8)
Alk. phosphatase: 43 IU/L (ref 39–117)
BUN/Creatinine ratio: 20 (ref 12–28)
BUN: 15 mg/dL (ref 8–27)
Bilirubin, total: 0.4 mg/dL (ref 0.0–1.2)
CO2: 24 mmol/L (ref 18–29)
Calcium: 9.3 mg/dL (ref 8.7–10.3)
Chloride: 103 mmol/L (ref 96–106)
Creatinine: 0.75 mg/dL (ref 0.57–1.00)
GFR est AA: 93 mL/min/{1.73_m2} (ref >59)
GFR est non-AA: 81 mL/min/{1.73_m2} (ref >59)
GLOBULIN, TOTAL: 2.7 g/dL (ref 1.5–4.5)
Glucose: 105 mg/dL — ABNORMAL HIGH (ref 65–99)
Potassium: 4.6 mmol/L (ref 3.5–5.2)
Protein, total: 7 g/dL (ref 6.0–8.5)
Sodium: 142 mmol/L (ref 134–144)

## 2016-12-29 LAB — THYROID CASCADE PROFILE: TSH: 2.71 u[IU]/mL (ref 0.450–4.500)

## 2017-01-04 ENCOUNTER — Ambulatory Visit
Admit: 2017-01-04 | Discharge: 2017-01-04 | Payer: BLUE CROSS/BLUE SHIELD | Attending: Family Medicine | Primary: Family Medicine

## 2017-01-04 DIAGNOSIS — I824Y9 Acute embolism and thrombosis of unspecified deep veins of unspecified proximal lower extremity: Secondary | ICD-10-CM

## 2017-01-04 NOTE — Patient Instructions (Signed)
Vaccine Information Statement    Influenza (Flu) Vaccine (Inactivated or Recombinant): What you need to know    Many Vaccine Information Statements are available in Spanish and other languages. See www.immunize.org/vis  Hojas de Informaci??n Sobre Vacunas est??n disponibles en Espa??ol y en muchos otros idiomas. Visite www.immunize.org/vis    1. Why get vaccinated?    Influenza (???flu???) is a contagious disease that spreads around the [place] every year, usually between October and May.     Flu is caused by influenza viruses, and is spread mainly by coughing, sneezing, and close contact.     Anyone can get flu. Flu strikes suddenly and can last several days. Symptoms vary by age, but can include:  ??? fever/chills  ??? sore throat  ??? muscle aches  ??? fatigue  ??? cough  ??? headache   ??? runny or stuffy nose    Flu can also lead to pneumonia and blood infections, and cause diarrhea and seizures in children.  If you have a medical condition, such as heart or lung disease, flu can make it worse.    Flu is more dangerous for some people. Infants and young children, people 65 years of age and older, pregnant women, and people with certain health conditions or a weakened immune system are at greatest risk.      Each year thousands of people in the [place] die from flu, and many more are hospitalized.     Flu vaccine can:  ??? keep you from getting flu,  ??? make flu less severe if you do get it, and  ??? keep you from spreading flu to your family and other people.     2. Inactivated and recombinant flu vaccines    A dose of flu vaccine is recommended every flu season. Children 6 months through 8 years of age may need two doses during the same flu season.  Everyone else needs only one dose each flu season.       Some inactivated flu vaccines contain a very small amount of a mercury-based preservative called thimerosal. Studies have not shown thimerosal in vaccines to be harmful, but flu vaccines that do not contain  thimerosal are available.    There is no live flu virus in flu shots.  They cannot cause the flu.     There are many flu viruses, and they are always changing. Each year a new flu vaccine is made to protect against three or four viruses that are likely to cause disease in the upcoming flu season. But even when the vaccine doesn???t exactly match these viruses, it may still provide some protection    Flu vaccine cannot prevent:  ??? flu that is caused by a virus not covered by the vaccine, or  ??? illnesses that look like flu but are not.    It takes about 2 weeks for protection to develop after vaccination, and protection lasts through the flu season.     3. Some people should not get this vaccine    Tell the person who is giving you the vaccine:    ??? If you have any severe, life-threatening allergies.    If you ever had a life-threatening allergic reaction after a dose of flu vaccine, or have a severe allergy to any part of this vaccine, you may be advised not to get vaccinated.  Most, but not all, types of flu vaccine contain a small amount of egg protein.       ??? If you   ever had Guillain-Barr?? Syndrome (also called GBS).   Some people with a history of GBS should not get this vaccine. This should be discussed with your doctor.    ??? If you are not feeling well.    It is usually okay to get flu vaccine when you have a mild illness, but you might be asked to come back when you feel better.      4. Risks of a vaccine reaction    With any medicine, including vaccines, there is a chance of reactions. These are usually mild and go away on their own, but serious reactions are also possible.     Most people who get a flu shot do not have any problems with it.     Minor problems following a flu shot include:   ??? soreness, redness, or swelling where the shot was given    ??? hoarseness  ??? sore, red or itchy eyes  ??? cough  ??? fever  ??? aches  ??? headache  ??? itching  ??? fatigue   If these problems occur, they usually begin soon after the shot and last 1 or 2 days.     More serious problems following a flu shot can include the following:    ??? There may be a small increased risk of Guillain-Barr?? Syndrome (GBS) after inactivated flu vaccine.  This risk has been estimated at 1 or 2 additional cases per million people vaccinated. This is much lower than the risk of severe complications from flu, which can be prevented by flu vaccine.      ??? Young children who get the flu shot along with pneumococcal vaccine (PCV13) and/or DTaP vaccine at the same time might be slightly more likely to have a seizure caused by fever. Ask your doctor for more information. Tell your doctor if a child who is getting flu vaccine has ever had a seizure.     Problems that could happen after any injected vaccine:     ??? People sometimes faint after a medical procedure, including vaccination. Sitting or lying down for about 15 minutes can help prevent fainting, and injuries caused by a fall. Tell your doctor if you feel dizzy, or have vision changes or ringing in the ears.    ??? Some people get severe pain in the shoulder and have difficulty moving the arm where a shot was given. This happens very rarely.    ??? Any medication can cause a severe allergic reaction. Such reactions from a vaccine are very rare, estimated at about 1 in a million doses, and would happen within a few minutes to a few hours after the vaccination.    As with any medicine, there is a very remote chance of a vaccine causing a serious injury or death.    The safety of vaccines is always being monitored. For more information, visit: www.cdc.gov/vaccinesafety/    5. What if there is a serious reaction?    What should I look for?    ??? Look for anything that concerns you, such as signs of a severe allergic reaction, very high fever, or unusual behavior.    Signs of a severe allergic reaction can include hives, swelling of the  face and throat, difficulty breathing, a fast heartbeat, dizziness, and weakness ??? usually within a few minutes to a few hours after the vaccination.    What should I do?    ??? If you think it is a severe allergic reaction or other emergency that   can???t wait, call 9-1-1 and get the person to the nearest hospital. Otherwise, call your doctor.    ??? Reactions should be reported to the Vaccine Adverse Event Reporting System (VAERS). Your doctor should file this report, or you can do it yourself through  the VAERS web site at www.vaers.hhs.gov, or by calling 1-800-822-7967.    VAERS does not give medical advice.    6. The National Vaccine Injury Compensation Program    The National Vaccine Injury Compensation Program (VICP) is a federal program that was created to compensate people who may have been injured by certain vaccines.    Persons who believe they may have been injured by a vaccine can learn about the program and about filing a claim by calling 1-800-338-2382 or visiting the VICP website at www.hrsa.gov/vaccinecompensation.  There is a time limit to file a claim for compensation.    7. How can I learn more?  ??? Ask your healthcare provider. He or she can give you the vaccine package insert or suggest other sources of information.  ??? Call your local or state health department.  ??? Contact the Centers for Disease Control and Prevention (CDC):  - Call 1-800-232-4636 (1-800-CDC-INFO) or  - Visit CDC???s website at www.cdc.gov/flu    Vaccine Information Statement   Inactivated Influenza Vaccine   07/04/2014  42 U[place] ?? [id number]    Department of Health and Human Services  Centers for Disease Control and Prevention    Office Use Only

## 2017-01-04 NOTE — Progress Notes (Signed)
[facility]  Caleesi Kohl L. Oletta Darter, M.D.  [address] [place] [address]  Ph No:  [phone]  Fax:  [phone]    CHIEF COMPLAINT:  Chief Complaint   Patient presents with   ??? Allergic Rhinitis     Patient is requesting refills.   ??? Cholesterol Problem     Patient is requesting refills   ??? Arthritis     Patietn is requesting refills.   ??? Labs     Go over results          HISTORY OF PRESENT ILLNESS:  Patient is here today for follow-up.  She has been under.  Her husband was hospitalized in psychiatric hospital mental status changes with dementia and posttraumatic stress disorder.  She states that he was hallucinating.  He has returned home but she has been very anxious as part of his delusion that he planned to kill her at himself.  She is quite worried.  Her blood pressure has been high.  She has had some difficulty sleeping at night.  She denies any chest pain.  She is requesting refills of her medications and to review her labs.  She otherwise has no other complaints today.    HISTORY:  Allergies   Allergen Reactions   ??? Codeine Unable to Obtain     Past Medical History:   Diagnosis Date   ??? Allergic rhinitis, cause unspecified 11/27/2012   ??? Contact dermatitis and other eczema, due to unspecified cause 11/27/2012   ??? Encounter for long-term (current) use of other medications 11/27/2012   ??? Long term (current) use of anticoagulants 11/27/2012   ??? Nonspecific abnormal electrocardiogram (ECG) (EKG) 11/27/2012   ??? Osteoporosis    ??? 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 ABDOMEN SURGERY PROC UNLISTED     ??? HX CHOLECYSTECTOMY     ??? HX GYN      Removed tubes   ??? VASCULAR SURGERY PROCEDURE UNLIST      Left leg striped the vein     Family History   Problem Relation Age of Onset   ??? Diabetes Mother    ??? Cancer Father    ??? Asthma Father    ??? Heart Disease Father    ??? Diabetes Sister    ??? Cancer Brother      Social History      Social History   ??? Marital status: MARRIED     Spouse name: N/A   ??? Number of children: N/A   ??? Years of education: N/A     Occupational History   ??? Not on file.     Social History Main Topics   ??? Smoking status: Never Smoker   ??? Smokeless tobacco: Never Used   ??? Alcohol use No   ??? Drug use: No   ??? Sexual activity: Not on file     Other Topics Concern   ??? Not on file     Social History Narrative     Current Outpatient Prescriptions   Medication Sig Dispense Refill   ??? rivaroxaban (XARELTO) 20 mg tab tablet Take 1 Tab by mouth daily (with dinner). 30 Tab 5   ??? atorvastatin (LIPITOR) 40 mg tablet take 1 tablet by mouth once daily 30 Tab 5   ??? alendronate (FOSAMAX) 70 mg tablet Take 1 Tab by mouth every seven (7) days. 4 Tab 5   ??? albuterol (  PROVENTIL HFA, VENTOLIN HFA, PROAIR HFA) 90 mcg/actuation inhaler Take 2 Puffs by inhalation every four (4) hours as needed for Wheezing (FOR SOB). 1 Inhaler 5   ??? montelukast (SINGULAIR) 10 mg tablet Take 1 Tab by mouth daily. 30 Tab 5   ??? nitroglycerin (NITROSTAT) 0.4 mg SL tablet by SubLINGual route every five (5) minutes as needed for Chest Pain.     ??? calcium-vitamin D (OYSTER SHELL CALCIUM-VIT D3) 250-125 mg-unit tablet Take 1 Tab by mouth daily.           REVIEW OF SYSTEMS:  ROS  Review of systems is as stated above, otherwise is negative.    PHYSICAL EXAM:  Vital Signs -   Visit Vitals   ??? BP 140/79 (BP 1 Location: Left arm, BP Patient Position: Sitting)   ??? Pulse 80   ??? Temp 96.6 ??F (35.9 ??C) (Oral)   ??? Ht 5' 1" (1.549 m)   ??? Wt 155 lb (70.3 kg)   ??? SpO2 100%   ??? BMI 29.29 kg/m2      Physical Exam   Constitutional: She is oriented to person, place, and time. She appears well-developed and well-nourished.   HENT:   Head: Normocephalic and atraumatic.   Eyes: Pupils are equal, round, and reactive to light.   Neck: Normal range of motion. Neck supple.   Cardiovascular: Normal rate, regular rhythm and normal heart sounds.     Pulmonary/Chest: Effort normal and breath sounds normal.   Musculoskeletal: Normal range of motion.   Neurological: She is alert and oriented to person, place, and time.   Psychiatric: She has a normal mood and affect. Her behavior is normal. Judgment and thought content normal.            LABS  Results for orders placed or performed in visit on 12/28/16   CBC WITH AUTOMATED DIFF   Result Value Ref Range    WBC 3.5 3.4 - 10.8 x10E3/uL    RBC 4.42 3.77 - 5.28 x10E6/uL    HGB 13.5 11.1 - 15.9 g/dL    HCT 41.6 34.0 - 46.6 %    MCV 94 79 - 97 fL    MCH 30.5 26.6 - 33.0 pg    MCHC 32.5 31.5 - 35.7 g/dL    RDW 14.2 12.3 - 15.4 %    PLATELET 324 150 - 379 x10E3/uL    NEUTROPHILS 35 Not Estab. %    Lymphocytes 47 Not Estab. %    MONOCYTES 12 Not Estab. %    EOSINOPHILS 4 Not Estab. %    BASOPHILS 2 Not Estab. %    ABS. NEUTROPHILS 1.2 (L) 1.4 - 7.0 x10E3/uL    Abs Lymphocytes 1.7 0.7 - 3.1 x10E3/uL    ABS. MONOCYTES 0.4 0.1 - 0.9 x10E3/uL    ABS. EOSINOPHILS 0.1 0.0 - 0.4 x10E3/uL    ABS. BASOPHILS 0.1 0.0 - 0.2 x10E3/uL    IMMATURE GRANULOCYTES 0 Not Estab. %    ABS. IMM. GRANS. 0.0 0.0 - 0.1 [id number]   METABOLIC PANEL, COMPREHENSIVE   Result Value Ref Range    Glucose 105 (H) 65 - 99 mg/dL    BUN 15 8 - 27 mg/dL    Creatinine 0.75 0.57 - 1.00 mg/dL    GFR est non-AA 81 >59 mL/min/1.73    GFR est AA 93 >59 mL/min/1.73    BUN/Creatinine ratio 20 12 - 28    Sodium 142 134 - 144 mmol/L    Potassium 4.6 3.5 -  5.2 mmol/L    Chloride 103 96 - 106 mmol/L    CO2 24 18 - 29 mmol/L    Calcium 9.3 8.7 - 10.3 mg/dL    Protein, total 7.0 6.0 - 8.5 g/dL    Albumin 4.3 3.5 - 4.8 g/dL    GLOBULIN, TOTAL 2.7 1.5 - 4.5 g/dL    A-G Ratio 1.6 1.2 - 2.2    Bilirubin, total 0.4 0.0 - 1.2 mg/dL    Alk. phosphatase 43 39 - 117 IU/L    AST (SGOT) 12 0 - 40 IU/L    ALT (SGPT) 7 0 - 32 IU/L   LIPID PANEL WITH LDL/HDL RATIO   Result Value Ref Range    Cholesterol, total 204 (H) 100 - 199 mg/dL    Triglyceride 76 0 - 149 mg/dL     HDL Cholesterol 66 >39 mg/dL    VLDL, calculated 15 5 - 40 mg/dL    LDL, calculated 123 (H) 0 - 99 mg/dL    LDL/HDL Ratio 1.9 0.0 - 3.2 ratio units   THYROID CASCADE PROFILE   Result Value Ref Range    TSH 2.710 0.450 - 4.500 uIU/mL           IMPRESSION/PLAN    Diagnoses and all orders for this visit:    1. Deep vein thrombosis (DVT) of proximal lower extremity, unspecified chronicity, unspecified laterality (HCC)  -     rivaroxaban (XARELTO) 20 mg tab tablet; Take 1 Tab by mouth daily (with dinner).    2. Mixed hyperlipidemia  -     atorvastatin (LIPITOR) 40 mg tablet; take 1 tablet by mouth once daily    3. Osteopetrosis  -     alendronate (FOSAMAX) 70 mg tablet; Take 1 Tab by mouth every seven (7) days.    4. Mild intermittent asthma without complication  -     albuterol (PROVENTIL HFA, VENTOLIN HFA, PROAIR HFA) 90 mcg/actuation inhaler; Take 2 Puffs by inhalation every four (4) hours as needed for Wheezing (FOR SOB).    5. Environmental allergies  -     montelukast (SINGULAIR) 10 mg tablet; Take 1 Tab by mouth daily.    6. Encounter for immunization  -     Influenza MDCK PF (FLUCELVAX QUADRIVALENT SYRINGE) ([id number])  -     Administration fee [phone]) for Medicare insured patients        I have asked her to monitor blood pressure at home.  Her goal will be to keep her blood pressure below 130/80.  She will call if her numbers are outside of the dose parameters.  Continue Xarelto for chronic DVT.  Continue atorvastatin 40 mg daily for hypercholesterolemia.  Continue Fosamax weekly for osteoporosis.  Continue albuterol MDI 2 puffs 4 times daily as needed shortness of breath.    She is given flu shot today.  Will follow-up here in 3 months.      Craig Staggers, MD            Dictated using voice recognition software. Proofread, but unrecognized voice recognition errors may exist.

## 2017-01-05 MED ORDER — ATORVASTATIN 40 MG TAB
40 mg | ORAL_TABLET | ORAL | 5 refills | Status: DC
Start: 2017-01-05 — End: 2017-08-14

## 2017-01-05 MED ORDER — ALBUTEROL SULFATE HFA 90 MCG/ACTUATION AEROSOL INHALER
90 mcg/actuation | RESPIRATORY_TRACT | 5 refills | Status: DC | PRN
Start: 2017-01-05 — End: 2018-01-10

## 2017-01-05 MED ORDER — MONTELUKAST 10 MG TAB
10 mg | ORAL_TABLET | Freq: Every day | ORAL | 5 refills | Status: DC
Start: 2017-01-05 — End: 2017-11-23

## 2017-01-05 MED ORDER — RIVAROXABAN 20 MG TAB
20 mg | ORAL_TABLET | Freq: Every day | ORAL | 5 refills | Status: DC
Start: 2017-01-05 — End: 2017-08-14

## 2017-01-05 MED ORDER — ALENDRONATE 70 MG TAB
70 mg | ORAL_TABLET | ORAL | 5 refills | Status: DC
Start: 2017-01-05 — End: 2018-01-10

## 2017-01-18 ENCOUNTER — Encounter: Payer: Self-pay | Admitting: *Deleted

## 2017-01-19 NOTE — Discharge Instructions (Signed)
Cataract Surgery, Care After °Refer to this sheet in the next few weeks. These instructions provide you with information about caring for yourself after your procedure. Your health care provider may also give you more specific instructions. Your treatment has been planned according to current medical practices, but problems sometimes occur. Call your health care provider if you have any problems or questions after your procedure. °What can I expect after the procedure? °After the procedure, it is common to have: °· Itching. °· Discomfort. °· Fluid discharge. °· Sensitivity to light and to touch. °· Bruising. °Follow these instructions at home: °Eye Care  °· Check your eye every day for signs of infection. Watch for: °¨ Redness, swelling, or pain. °¨ Fluid, blood, or pus. °¨ Warmth. °¨ Bad smell. °Activity  °· Avoid strenuous activities, such as playing contact sports, for as long as told by your health care provider. °· Do not drive or operate heavy machinery until your health care provider approves. °· Do not bend or lift heavy objects . Bending increases pressure in the eye. You can walk, climb stairs, and do light household chores. °· Ask your health care provider when you can return to work. If you work in a dusty environment, you may be advised to wear protective eyewear for a period of time. °General instructions  °· Take or apply over-the-counter and prescription medicines only as told by your health care provider. This includes eye drops. °· Do not touch or rub your eyes. °· If you were given a protective shield, wear it as told by your health care provider. If you were not given a protective shield, wear sunglasses as told by your health care provider to protect your eyes. °· Keep the area around your eye clean and dry. Avoid swimming or allowing water to hit you directly in the face while showering until told by your health care provider. Keep soap and shampoo out of your eyes. °· Do not put a contact lens  into the affected eye or eyes until your health care provider approves. °· Keep all follow-up visits as told by your health care provider. This is important. °Contact a health care provider if: ° °· You have increased bruising around your eye. °· You have pain that is not helped with medicine. °· You have a fever. °· You have redness, swelling, or pain in your eye. °· You have fluid, blood, or pus coming from your incision. °· Your vision gets worse. °Get help right away if: °· You have sudden vision loss. °This information is not intended to replace advice given to you by your health care provider. Make sure you discuss any questions you have with your health care provider. °Document Released: 06/03/2005 Document Revised: 03/24/2016 Document Reviewed: 09/24/2015 °Elsevier Interactive Patient Education © 2017 [employer] Inc[employer] ° ° ° ° °General Anesthesia, Adult, Care After °These instructions provide you with information about caring for yourself after your procedure. Your health care provider may also give you more specific instructions. Your treatment has been planned according to current medical practices, but problems sometimes occur. Call your health care provider if you have any problems or questions after your procedure. °What can I expect after the procedure? °After the procedure, it is common to have: °· Vomiting. °· A sore throat. °· Mental slowness. °It is common to feel: °· Nauseous. °· Cold or shivery. °· Sleepy. °· Tired. °· Sore or achy, even in parts of your body where you did not have surgery. °Follow these instructions at   home: °For at least 24 hours after the procedure:  °· Do not: °¨ Participate in activities where you could fall or become injured. °¨ Drive. °¨ Use heavy machinery. °¨ Drink alcohol. °¨ Take sleeping pills or medicines that cause drowsiness. °¨ Make important decisions or sign legal documents. °¨ Take care of children on your own. °· Rest. °Eating and drinking  °· If you vomit, drink  water, juice, or soup when you can drink without vomiting. °· Drink enough fluid to keep your urine clear or pale yellow. °· Make sure you have little or no nausea before eating solid foods. °· Follow the diet recommended by your health care provider. °General instructions  °· Have a responsible adult stay with you until you are awake and alert. °· Return to your normal activities as told by your health care provider. Ask your health care provider what activities are safe for you. °· Take over-the-counter and prescription medicines only as told by your health care provider. °· If you smoke, do not smoke without supervision. °· Keep all follow-up visits as told by your health care provider. This is important. °Contact a health care provider if: °· You continue to have nausea or vomiting at home, and medicines are not helpful. °· You cannot drink fluids or start eating again. °· You cannot urinate after 8-12 hours. °· You develop a skin rash. °· You have fever. °· You have increasing redness at the site of your procedure. °Get help right away if: °· You have difficulty breathing. °· You have chest pain. °· You have unexpected bleeding. °· You feel that you are having a life-threatening or urgent problem. °This information is not intended to replace advice given to you by your health care provider. Make sure you discuss any questions you have with your health care provider. °Document Released: 02/20/2001 Document Revised: 04/18/2016 Document Reviewed: 10/29/2015 °Elsevier Interactive Patient Education © 2017 Elsevier Inc[employer] ° °

## 2017-01-25 ENCOUNTER — Encounter
Admission: RE | Disposition: A | Discharge status: Home / Self Care | Payer: Self-pay | Source: Ambulatory Visit | Attending: Ophthalmology

## 2017-01-25 ENCOUNTER — Ambulatory Visit: Payer: Medicare Other | Admitting: Anesthesiology

## 2017-01-25 ENCOUNTER — Ambulatory Visit
Admission: RE | Admit: 2017-01-25 | Discharge: 2017-01-25 | Disposition: A | Discharge status: Home / Self Care | Payer: Medicare Other | Source: Ambulatory Visit | Attending: Ophthalmology | Admitting: Ophthalmology

## 2017-01-25 DIAGNOSIS — Z79899 Other long term (current) drug therapy: Secondary | ICD-10-CM | POA: Insufficient documentation

## 2017-01-25 DIAGNOSIS — E1136 Type 2 diabetes mellitus with diabetic cataract: Secondary | ICD-10-CM | POA: Insufficient documentation

## 2017-01-25 DIAGNOSIS — I10 Essential (primary) hypertension: Secondary | ICD-10-CM | POA: Diagnosis not present

## 2017-01-25 HISTORY — PX: CATARACT EXTRACTION W/PHACO: SHX586

## 2017-01-25 LAB — GLUCOSE, CAPILLARY
GLUCOSE-CAPILLARY: 167 mg/dL — AB (ref 65–99)
GLUCOSE-CAPILLARY: 156 mg/dL — AB (ref 65–99)

## 2017-01-25 SURGERY — PHACOEMULSIFICATION, CATARACT, WITH IOL INSERTION
Anesthesia: Monitor Anesthesia Care | Laterality: Right | Wound class: Clean

## 2017-01-25 MED ORDER — ACETAMINOPHEN 325 MG PO TABS: 325.0000 mg | ORAL_TABLET | ORAL | Status: DC | PRN

## 2017-01-25 MED ORDER — ACETAMINOPHEN 160 MG/5ML PO SOLN: 325.0000 mg | ORAL | Status: DC | PRN

## 2017-01-25 MED ORDER — LIDOCAINE HCL (CARDIAC) 20 MG/ML IV SOLN
INTRAVENOUS | Status: DC | PRN
  Administered 2017-01-25: 60 mg via INTRAVENOUS

## 2017-01-25 MED ORDER — FENTANYL CITRATE (PF) 100 MCG/2ML IJ SOLN
INTRAMUSCULAR | Status: DC | PRN
  Administered 2017-01-25: 50 ug via INTRAVENOUS

## 2017-01-25 MED ORDER — LACTATED RINGERS IV SOLN: INTRAVENOUS | Status: DC

## 2017-01-25 MED ORDER — PHENYLEPHRINE HCL 10 % OP SOLN
1.0000 [drp] | OPHTHALMIC | Status: DC | PRN
  Administered 2017-01-25 (×3): 1 [drp] via OPHTHALMIC

## 2017-01-25 MED ORDER — BRIMONIDINE TARTRATE-TIMOLOL 0.2-0.5 % OP SOLN
OPHTHALMIC | Status: DC | PRN
  Administered 2017-01-25: 1 [drp] via OPHTHALMIC

## 2017-01-25 MED ORDER — LIDOCAINE HCL (PF) 2 % IJ SOLN
INTRAOCULAR | Status: DC | PRN
  Administered 2017-01-25: 08:00:00 via INTRAOCULAR

## 2017-01-25 MED ORDER — EPINEPHRINE PF 1 MG/ML IJ SOLN
INTRAMUSCULAR | Status: DC | PRN
  Administered 2017-01-25: 58 mL via OPHTHALMIC

## 2017-01-25 MED ORDER — MOXIFLOXACIN HCL 0.5 % OP SOLN
OPHTHALMIC | Status: DC | PRN
  Administered 2017-01-25: .3 mL via OPHTHALMIC

## 2017-01-25 MED ORDER — CYCLOPENTOLATE HCL 2 % OP SOLN
1.0000 [drp] | OPHTHALMIC | Status: DC | PRN
  Administered 2017-01-25 (×3): 1 [drp] via OPHTHALMIC

## 2017-01-25 MED ORDER — MIDAZOLAM HCL 2 MG/2ML IJ SOLN
INTRAMUSCULAR | Status: DC | PRN
  Administered 2017-01-25: 2 mg via INTRAVENOUS

## 2017-01-25 MED ORDER — TETRACAINE HCL 0.5 % OP SOLN
1.0000 [drp] | OPHTHALMIC | Status: DC | PRN
  Administered 2017-01-25 (×2): 1 [drp] via OPHTHALMIC

## 2017-01-25 MED ORDER — MOXIFLOXACIN HCL 0.5 % OP SOLN
1.0000 [drp] | OPHTHALMIC | Status: DC | PRN
  Administered 2017-01-25 (×3): 1 [drp] via OPHTHALMIC

## 2017-01-25 MED ORDER — NA HYALUR & NA CHOND-NA HYALUR 0.4-0.35 ML IO KIT
PACK | INTRAOCULAR | Status: DC | PRN
  Administered 2017-01-25: 1 mL via INTRAOCULAR

## 2017-01-25 SURGICAL SUPPLY — 25 items
CANNULA ANT/CHMB 27GA (MISCELLANEOUS) ×2 IMPLANT
CARTRIDGE ABBOTT (MISCELLANEOUS) IMPLANT
GLOVE SURG LX 7.5 STRW (GLOVE) ×1
GLOVE SURG LX STRL 7.5 STRW (GLOVE) ×1 IMPLANT
GLOVE SURG TRIUMPH 8.0 PF LTX (GLOVE) ×2 IMPLANT
GOWN STRL REUS W/ TWL LRG LVL3 (GOWN DISPOSABLE) ×2 IMPLANT
GOWN STRL REUS W/TWL LRG LVL3 (GOWN DISPOSABLE) ×2
LENS IOL TECNIS ITEC 21.5 (Intraocular Lens) ×2 IMPLANT
MARKER SKIN DUAL TIP RULER LAB (MISCELLANEOUS) ×2 IMPLANT
NDL RETROBULBAR .5 NSTRL (NEEDLE) IMPLANT
NEEDLE FILTER BLUNT 18X 1/2SAF (NEEDLE) ×1
NEEDLE FILTER BLUNT 18X1 1/2 (NEEDLE) ×1 IMPLANT
PACK CATARACT BRASINGTON (MISCELLANEOUS) ×2 IMPLANT
PACK EYE AFTER SURG (MISCELLANEOUS) ×2 IMPLANT
PACK OPTHALMIC (MISCELLANEOUS) ×2 IMPLANT
RING MALYGIN 7.0 (MISCELLANEOUS) IMPLANT
SUT ETHILON 10-0 CS-B-6CS-B-6 (SUTURE)
SUT VICRYL  9 0 (SUTURE)
SUT VICRYL 9 0 (SUTURE) IMPLANT
SUTURE EHLN 10-0 CS-B-6CS-B-6 (SUTURE) IMPLANT
SYR 3ML LL SCALE MARK (SYRINGE) ×2 IMPLANT
SYR 5ML LL (SYRINGE) ×2 IMPLANT
SYR TB 1ML LUER SLIP (SYRINGE) ×2 IMPLANT
WATER STERILE IRR 250ML POUR (IV SOLUTION) ×2 IMPLANT
WIPE NON LINTING 3.25X3.25 (MISCELLANEOUS) ×2 IMPLANT

## 2017-01-25 NOTE — Transfer of Care (Signed)
Immediate Anesthesia Transfer of Care Note  Patient: Sarah [name]  Procedure(s) Performed: Procedure(s) with comments: CATARACT EXTRACTION PHACO AND INTRAOCULAR LENS PLACEMENT (IOC)  right diabetic (Right) - diabetic - oral meds  Patient Location: PACU  Anesthesia Type: MAC  Level of Consciousness: awake, alert  and patient cooperative  Airway and Oxygen Therapy: Patient Spontanous Breathing and Patient connected to supplemental oxygen  Post-op Assessment: Post-op Vital signs reviewed, Patient's Cardiovascular Status Stable, Respiratory Function Stable, Patent Airway and No signs of Nausea or vomiting  Post-op Vital Signs: Reviewed and stable  Complications: No apparent anesthesia complications

## 2017-01-25 NOTE — Anesthesia Procedure Notes (Signed)
Procedure Name: MAC Performed by: Angellynn Kimberlin Pre-anesthesia Checklist: Patient identified, Emergency Drugs available, Suction available, Timeout performed and Patient being monitored Patient Re-evaluated:Patient Re-evaluated prior to inductionOxygen Delivery Method: Nasal cannula Placement Confirmation: positive ETCO2     

## 2017-01-25 NOTE — Op Note (Signed)
LOCATION:  [place]   PREOPERATIVE DIAGNOSIS:    Nuclear sclerotic cataract right eye. H25.11   POSTOPERATIVE DIAGNOSIS:  Nuclear sclerotic cataract right eye.     PROCEDURE:  Phacoemusification with posterior chamber intraocular lens placement of the right eye   LENS:   Implant Name Type Inv. Item Serial No. Manufacturer Lot No. LRB No. Used  LENS IOL DIOP 21.5 - [id number] Intraocular Lens LENS IOL DIOP 21.5 [id number]   Right 1        ULTRASOUND TIME: 11.5 % of 0 minutes, 56 seconds.  CDE 6.4   SURGEON:  Wyonia Hough, MD   ANESTHESIA:  Topical with tetracaine drops and 2% Xylocaine jelly, augmented with 1% preservative-free intracameral lidocaine.    COMPLICATIONS:  None.   DESCRIPTION OF PROCEDURE:  The patient was identified in the holding room and transported to the operating room and placed in the supine position under the operating microscope.  The right eye was identified as the operative eye and it was prepped and draped in the usual sterile ophthalmic fashion.   A 1 millimeter clear-corneal paracentesis was made at the 12:00 position.  0.5 ml of preservative-free 1% lidocaine was injected into the anterior chamber. The anterior chamber was filled with Viscoat viscoelastic.  A 2.4 millimeter keratome was used to make a near-clear corneal incision at the 9:00 position.  A curvilinear capsulorrhexis was made with a cystotome and capsulorrhexis forceps.  Balanced salt solution was used to hydrodissect and hydrodelineate the nucleus.   Phacoemulsification was then used in stop and chop fashion to remove the lens nucleus and epinucleus.  The remaining cortex was then removed using the irrigation and aspiration handpiece. Provisc was then placed into the capsular bag to distend it for lens placement.  A lens was then injected into the capsular bag.  The remaining viscoelastic was aspirated.   Wounds were hydrated with balanced salt solution.  The anterior  chamber was inflated to a physiologic pressure with balanced salt solution.  No wound leaks were noted. Vigamox 0.2 ml of a 1mg  per ml solution was injected into the anterior chamber for a dose of 0.2 mg of intracameral antibiotic at the completion of the case.   Timolol and Brimonidine drops were applied to the eye.  The patient was taken to the recovery room in stable condition without complications of anesthesia or surgery.   Jaquawn Saffran 01/25/2017, 8:09 AM

## 2017-01-25 NOTE — Anesthesia Postprocedure Evaluation (Signed)
Anesthesia Post Note  Patient: Sarah [name]  Procedure(s) Performed: Procedure(s) (LRB): CATARACT EXTRACTION PHACO AND INTRAOCULAR LENS PLACEMENT (IOC)  right diabetic (Right)  Patient location during evaluation: PACU Anesthesia Type: MAC Level of consciousness: awake and alert and oriented Pain management: satisfactory to patient Vital Signs Assessment: post-procedure vital signs reviewed and stable Respiratory status: spontaneous breathing, nonlabored ventilation and respiratory function stable Cardiovascular status: blood pressure returned to baseline and stable Postop Assessment: Adequate PO intake and No signs of nausea or vomiting Anesthetic complications: no    Raliegh Ip

## 2017-01-25 NOTE — Anesthesia Preprocedure Evaluation (Signed)
Anesthesia Evaluation  Patient identified by MRN, date of birth, ID band Patient awake    Reviewed: Allergy & Precautions, H&P , NPO status , Patient's Chart, lab work & pertinent test results  Airway Mallampati: II  TM Distance: >3 FB Neck ROM: full    Dental  (+) Chipped   Pulmonary neg pulmonary ROS,    Pulmonary exam normal        Cardiovascular hypertension, On Medications Normal cardiovascular exam     Neuro/Psych    GI/Hepatic negative GI ROS, Neg liver ROS,   Endo/Other  diabetes, Well Controlled, Type 2  Renal/GU      Musculoskeletal   Abdominal   Peds  Hematology negative hematology ROS (+)   Anesthesia Other Findings   Reproductive/Obstetrics negative OB ROS                             Anesthesia Physical  Anesthesia Plan  ASA: II  Anesthesia Plan: MAC   Post-op Pain Management:    Induction:   Airway Management Planned:   Additional Equipment:   Intra-op Plan:   Post-operative Plan:   Informed Consent: I have reviewed the patients History and Physical, chart, labs and discussed the procedure including the risks, benefits and alternatives for the proposed anesthesia with the patient or authorized representative who has indicated his/her understanding and acceptance.     Plan Discussed with:   Anesthesia Plan Comments:         Anesthesia Quick Evaluation

## 2017-01-25 NOTE — H&P (Signed)
The History and Physical notes are on paper, have been signed, and are to be scanned. The patient remains stable and unchanged from the H&P.   Previous H&P reviewed, patient examined, and there are no changes.  Karli Wickizer 01/25/2017 7:46 AM

## 2017-01-26 ENCOUNTER — Encounter: Payer: Self-pay | Admitting: Ophthalmology

## 2017-06-27 ENCOUNTER — Encounter: Payer: MEDICARE | Primary: Family Medicine

## 2017-07-04 ENCOUNTER — Encounter: Attending: Family Medicine | Primary: Family Medicine

## 2017-07-12 ENCOUNTER — Ambulatory Visit
Admit: 2017-07-12 | Discharge: 2017-07-12 | Payer: BLUE CROSS/BLUE SHIELD | Attending: Family Medicine | Primary: Family Medicine

## 2017-07-12 DIAGNOSIS — E782 Mixed hyperlipidemia: Secondary | ICD-10-CM

## 2017-07-12 NOTE — Progress Notes (Signed)
[facility]  Marsena Taff L. Oletta Darter, M.D.  [address] [name] [address]  Ph No:  [phone]  Fax:  [phone]    CHIEF COMPLAINT:  Chief Complaint   Patient presents with   ??? Follow-up     Tonya Shaffer presents today for a six (6) month follow up visit. She feels well today with minor concerns.   ??? Cough     She has been experiencing a cough x 1 week. Cough has been dry. Additionally, there has been some nasal drainage. She denies sneezing, headache, or ear pain. She has not taken any medications for the cough.          HISTORY OF PRESENT ILLNESS:  Patient is here today for follow-up.  She has been under great deal of stress.  Her husband apparently is suffering from some posttraumatic stress from the war.  He has been having to see a psychiatrist and was for the time admitted to psychiatric hospital.  This is caused her great deal of stress and anxiety.  She has tried to maintain an active lifestyle.  She denies any chest pain or shortness of breath.  She denies any fever chills.  She does have a cough that has been present for about a week.  She describes it as dry.  She has had some nasal drainage and some sneezing and headache.  She also has pain in her ear.  She otherwise has no other complaints.    HISTORY:  Allergies   Allergen Reactions   ??? Codeine Unable to Obtain     Past Medical History:   Diagnosis Date   ??? Allergic rhinitis, cause unspecified 11/27/2012   ??? Contact dermatitis and other eczema, due to unspecified cause 11/27/2012   ??? Encounter for long-term (current) use of other medications 11/27/2012   ??? Long term (current) use of anticoagulants 11/27/2012   ??? Nonspecific abnormal electrocardiogram (ECG) (EKG) 11/27/2012   ??? Osteoporosis    ??? 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 ABDOMEN SURGERY PROC UNLISTED     ??? HX CHOLECYSTECTOMY     ??? HX GYN      Removed tubes   ??? VASCULAR SURGERY PROCEDURE UNLIST       Left leg striped the vein     Family History   Problem Relation Age of Onset   ??? Diabetes Mother    ??? Cancer Father    ??? Asthma Father    ??? Heart Disease Father    ??? Diabetes Sister    ??? Cancer Brother      Social History     Social History   ??? Marital status: MARRIED     Spouse name: N/A   ??? Number of children: N/A   ??? Years of education: N/A     Occupational History   ??? Not on file.     Social History Main Topics   ??? Smoking status: Never Smoker   ??? Smokeless tobacco: Never Used   ??? Alcohol use No   ??? Drug use: No   ??? Sexual activity: Not on file     Other Topics Concern   ??? Not on file     Social History Narrative     Current Outpatient Prescriptions   Medication Sig Dispense Refill   ??? rivaroxaban (XARELTO) 20 mg tab tablet Take 1 Tab by mouth daily (with dinner). 30 Tab  5   ??? atorvastatin (LIPITOR) 40 mg tablet take 1 tablet by mouth once daily 30 Tab 5   ??? alendronate (FOSAMAX) 70 mg tablet Take 1 Tab by mouth every seven (7) days. 4 Tab 5   ??? albuterol (PROVENTIL HFA, VENTOLIN HFA, PROAIR HFA) 90 mcg/actuation inhaler Take 2 Puffs by inhalation every four (4) hours as needed for Wheezing (FOR SOB). 1 Inhaler 5   ??? montelukast (SINGULAIR) 10 mg tablet Take 1 Tab by mouth daily. 30 Tab 5   ??? nitroglycerin (NITROSTAT) 0.4 mg SL tablet by SubLINGual route every five (5) minutes as needed for Chest Pain.     ??? calcium-vitamin D (OYSTER SHELL CALCIUM-VIT D3) 250-125 mg-unit tablet Take 1 Tab by mouth daily.           REVIEW OF SYSTEMS:  ROS  Review of systems is as stated above, otherwise is negative.    PHYSICAL EXAM:  Vital Signs -   Visit Vitals   ??? BP 126/79   ??? Pulse 78   ??? Temp 97 ??F (36.1 ??C) (Oral)   ??? Resp 17   ??? Ht '5\' 1"'  (1.549 m)   ??? Wt 160 lb (72.6 kg)   ??? SpO2 98%   ??? BMI 30.23 kg/m2      Physical Exam   Constitutional: She is oriented to person, place, and time. She appears well-developed and well-nourished.   HENT:   Head: Normocephalic and atraumatic.    She has some clear nasal drainage.  Some post tympanic effusion bilaterally.  Throat is mildly erythematous with posterior pharyngeal mucus drainage and no exudate.   Eyes: Pupils are equal, round, and reactive to light.   Neck: Normal range of motion. Neck supple.   Cardiovascular: Normal rate, regular rhythm and normal heart sounds.    Pulmonary/Chest: Effort normal and breath sounds normal.   Musculoskeletal: Normal range of motion.   Neurological: She is alert and oriented to person, place, and time.   Psychiatric: She has a normal mood and affect. Her behavior is normal. Judgment and thought content normal.            LABS  Results for orders placed or performed in visit on 07/12/17   CBC WITH AUTOMATED DIFF   Result Value Ref Range    WBC 4.2 3.4 - 10.8 x10E3/uL    RBC 4.14 3.77 - 5.28 x10E6/uL    HGB 13.1 11.1 - 15.9 g/dL    HCT 40.2 34.0 - 46.6 %    MCV 97 79 - 97 fL    MCH 31.6 26.6 - 33.0 pg    MCHC 32.6 31.5 - 35.7 g/dL    RDW 13.7 12.3 - 15.4 %    PLATELET 317 150 - 379 x10E3/uL    NEUTROPHILS 39 Not Estab. %    Lymphocytes 49 Not Estab. %    MONOCYTES 7 Not Estab. %    EOSINOPHILS 4 Not Estab. %    BASOPHILS 1 Not Estab. %    ABS. NEUTROPHILS 1.6 1.4 - 7.0 x10E3/uL    Abs Lymphocytes 2.0 0.7 - 3.1 x10E3/uL    ABS. MONOCYTES 0.3 0.1 - 0.9 x10E3/uL    ABS. EOSINOPHILS 0.2 0.0 - 0.4 x10E3/uL    ABS. BASOPHILS 0.1 0.0 - 0.2 x10E3/uL    IMMATURE GRANULOCYTES 0 Not Estab. %    ABS. IMM. GRANS. 0.0 0.0 - 0.1 H06C3/JS   METABOLIC PANEL, COMPREHENSIVE   Result Value Ref Range    Glucose 97  65 - 99 mg/dL    BUN 9 8 - 27 mg/dL    Creatinine 0.78 0.57 - 1.00 mg/dL    GFR est non-AA 77 >59 mL/min/1.73    GFR est AA 88 >59 mL/min/1.73    BUN/Creatinine ratio 12 12 - 28    Sodium 139 134 - 144 mmol/L    Potassium 4.5 3.5 - 5.2 mmol/L    Chloride 101 96 - 106 mmol/L    CO2 24 20 - 29 mmol/L    Calcium 9.1 8.7 - 10.3 mg/dL    Protein, total 7.0 6.0 - 8.5 g/dL    Albumin 4.4 3.5 - 4.8 g/dL     GLOBULIN, TOTAL 2.6 1.5 - 4.5 g/dL    A-G Ratio 1.7 1.2 - 2.2    Bilirubin, total 0.4 0.0 - 1.2 mg/dL    Alk. phosphatase 39 39 - 117 IU/L    AST (SGOT) 13 0 - 40 IU/L    ALT (SGPT) 12 0 - 32 IU/L   HEMOGLOBIN A1C W/O EAG   Result Value Ref Range    Hemoglobin A1c 5.5 4.8 - 5.6 %   LIPID PANEL   Result Value Ref Range    Cholesterol, total 136 100 - 199 mg/dL    Triglyceride 60 0 - 149 mg/dL    HDL Cholesterol 69 >39 mg/dL    VLDL, calculated 12 5 - 40 mg/dL    LDL, calculated 55 0 - 99 mg/dL           IMPRESSION/PLAN    Diagnoses and all orders for this visit:    1. Mixed hyperlipidemia  -     CBC WITH AUTOMATED DIFF  -     METABOLIC PANEL, COMPREHENSIVE  -     HEMOGLOBIN A1C W/O EAG  -     LIPID PANEL    2. Impaired fasting glucose  -     CBC WITH AUTOMATED DIFF  -     METABOLIC PANEL, COMPREHENSIVE  -     HEMOGLOBIN A1C W/O EAG  -     LIPID PANEL    3. Acute URI        We are checking labs today including CBC, CMP, A1c and lipid profile.  We will call with results of her labs.  We reviewed her previous labs which have been well controlled.  She is encouraged to continue to take medications as prescribed.  Encouraged exercise regularly to get her weight down and get her BMI within normal range.  She is advised to use Flonase or Nasonex over-the-counter for nasal drainage and cough.  Does not appear to be bacterial.  Call however if symptoms get worse.      Craig Staggers, MD            Dictated using voice recognition software. Proofread, but unrecognized voice recognition errors may exist.

## 2017-07-13 LAB — METABOLIC PANEL, COMPREHENSIVE
A-G Ratio: 1.7 (ref 1.2–2.2)
ALT (SGPT): 12 IU/L (ref 0–32)
AST (SGOT): 13 IU/L (ref 0–40)
Albumin: 4.4 g/dL (ref 3.5–4.8)
Alk. phosphatase: 39 IU/L (ref 39–117)
BUN/Creatinine ratio: 12 (ref 12–28)
BUN: 9 mg/dL (ref 8–27)
Bilirubin, total: 0.4 mg/dL (ref 0.0–1.2)
CO2: 24 mmol/L (ref 20–29)
Calcium: 9.1 mg/dL (ref 8.7–10.3)
Chloride: 101 mmol/L (ref 96–106)
Creatinine: 0.78 mg/dL (ref 0.57–1.00)
GFR est AA: 88 mL/min/{1.73_m2} (ref >59)
GFR est non-AA: 77 mL/min/{1.73_m2} (ref >59)
GLOBULIN, TOTAL: 2.6 g/dL (ref 1.5–4.5)
Glucose: 97 mg/dL (ref 65–99)
Potassium: 4.5 mmol/L (ref 3.5–5.2)
Protein, total: 7 g/dL (ref 6.0–8.5)
Sodium: 139 mmol/L (ref 134–144)

## 2017-07-13 LAB — CBC WITH AUTOMATED DIFF
ABS. BASOPHILS: 0.1 10*3/uL (ref 0.0–0.2)
ABS. EOSINOPHILS: 0.2 10*3/uL (ref 0.0–0.4)
ABS. IMM. GRANS.: 0 10*3/uL (ref 0.0–0.1)
ABS. MONOCYTES: 0.3 10*3/uL (ref 0.1–0.9)
ABS. NEUTROPHILS: 1.6 10*3/uL (ref 1.4–7.0)
Abs Lymphocytes: 2 10*3/uL (ref 0.7–3.1)
BASOPHILS: 1 %
EOSINOPHILS: 4 %
HCT: 40.2 % (ref 34.0–46.6)
HGB: 13.1 g/dL (ref 11.1–15.9)
IMMATURE GRANULOCYTES: 0 %
Lymphocytes: 49 %
MCH: 31.6 pg (ref 26.6–33.0)
MCHC: 32.6 g/dL (ref 31.5–35.7)
MCV: 97 fL (ref 79–97)
MONOCYTES: 7 %
NEUTROPHILS: 39 %
PLATELET: 317 10*3/uL (ref 150–379)
RBC: 4.14 x10E6/uL (ref 3.77–5.28)
RDW: 13.7 % (ref 12.3–15.4)
WBC: 4.2 10*3/uL (ref 3.4–10.8)

## 2017-07-13 LAB — HEMOGLOBIN A1C W/O EAG: Hemoglobin A1c: 5.5 % (ref 4.8–5.6)

## 2017-07-13 LAB — LIPID PANEL
Cholesterol, total: 136 mg/dL (ref 100–199)
HDL Cholesterol: 69 mg/dL (ref >39)
LDL, calculated: 55 mg/dL (ref 0–99)
Triglyceride: 60 mg/dL (ref 0–149)
VLDL, calculated: 12 mg/dL (ref 5–40)

## 2017-07-17 NOTE — Progress Notes (Signed)
Tell patient that her labs are all normal.  A1c is normal.  Repeat in 1 year.

## 2017-08-14 ENCOUNTER — Encounter

## 2017-08-14 MED ORDER — ATORVASTATIN 40 MG TAB
40 mg | ORAL_TABLET | ORAL | 5 refills | Status: DC
Start: 2017-08-14 — End: 2018-03-30

## 2017-08-14 MED ORDER — RIVAROXABAN 20 MG TAB
20 mg | ORAL_TABLET | Freq: Every day | ORAL | 5 refills | Status: DC
Start: 2017-08-14 — End: 2018-03-06

## 2017-08-14 NOTE — Telephone Encounter (Signed)
Pt requested refills, ok per Dr Barksdale via protocol, rx sent in and patient is aware via voice mail

## 2017-09-16 ENCOUNTER — Emergency Department: Payer: Medicare Other

## 2017-09-16 ENCOUNTER — Emergency Department
Admission: EM | Admit: 2017-09-16 | Discharge: 2017-09-16 | Disposition: A | Discharge status: Home / Self Care | Payer: Medicare Other | Attending: Emergency Medicine | Admitting: Emergency Medicine

## 2017-09-16 ENCOUNTER — Encounter: Payer: Self-pay | Admitting: Emergency Medicine

## 2017-09-16 DIAGNOSIS — Z79899 Other long term (current) drug therapy: Secondary | ICD-10-CM | POA: Insufficient documentation

## 2017-09-16 DIAGNOSIS — E039 Hypothyroidism, unspecified: Secondary | ICD-10-CM | POA: Diagnosis not present

## 2017-09-16 DIAGNOSIS — I10 Essential (primary) hypertension: Secondary | ICD-10-CM | POA: Insufficient documentation

## 2017-09-16 DIAGNOSIS — Z85850 Personal history of malignant neoplasm of thyroid: Secondary | ICD-10-CM | POA: Diagnosis not present

## 2017-09-16 DIAGNOSIS — R51 Headache: Secondary | ICD-10-CM | POA: Diagnosis not present

## 2017-09-16 DIAGNOSIS — W19XXXA Unspecified fall, initial encounter: Secondary | ICD-10-CM

## 2017-09-16 DIAGNOSIS — W010XXA Fall on same level from slipping, tripping and stumbling without subsequent striking against object, initial encounter: Secondary | ICD-10-CM | POA: Diagnosis not present

## 2017-09-16 DIAGNOSIS — S0990XA Unspecified injury of head, initial encounter: Secondary | ICD-10-CM | POA: Diagnosis present

## 2017-09-16 DIAGNOSIS — Y939 Activity, unspecified: Secondary | ICD-10-CM | POA: Diagnosis not present

## 2017-09-16 DIAGNOSIS — Y998 Other external cause status: Secondary | ICD-10-CM | POA: Insufficient documentation

## 2017-09-16 DIAGNOSIS — Y92008 Other place in unspecified non-institutional (private) residence as the place of occurrence of the external cause: Secondary | ICD-10-CM | POA: Diagnosis not present

## 2017-09-16 DIAGNOSIS — S0083XA Contusion of other part of head, initial encounter: Secondary | ICD-10-CM | POA: Diagnosis not present

## 2017-09-16 DIAGNOSIS — E119 Type 2 diabetes mellitus without complications: Secondary | ICD-10-CM | POA: Insufficient documentation

## 2017-09-16 DIAGNOSIS — Z7984 Long term (current) use of oral hypoglycemic drugs: Secondary | ICD-10-CM | POA: Diagnosis not present

## 2017-09-16 MED ORDER — ACETAMINOPHEN 500 MG PO TABS
500.0000 mg | ORAL_TABLET | Freq: Once | ORAL | Status: AC
  Administered 2017-09-16: 500 mg via ORAL
  Filled 2017-09-16: qty 1

## 2017-09-16 NOTE — Discharge Instructions (Signed)
For pain take tylenol 1000mg  (2 extra strength) every 8 hours. Apply ice.  You were seen in the emergency department after a fall. Luckily all of your imaging studies did not show any evidence of injuries. Follow-up with you doctor within the next 2-3 days for further evaluation. Sometimes injuries can present at a later time and therefore it is imperative that you return to the emergency room if you have a severe headache, facial droop, neck pain, numbness or weakness of your extremities, slurred speech, difficulty finding words, chest pain, back pain, abdominal pain, or any other new symptoms that were not present during this visit. You may take Tylenol at home for your pain.

## 2017-09-16 NOTE — ED Notes (Addendum)
This RN spoke with Dr. Reita Cliche about pt presentation received verbal order for CT of head and Ct  maxillofacial

## 2017-09-16 NOTE — ED Provider Notes (Signed)
[facility] Emergency Department Provider Note  ____________________________________________  Time seen: Approximately 4:56 PM  I have reviewed the triage vital signs and the nursing notes.   HISTORY  Chief Complaint Fall and Headache   HPI Sarah [name] is a 71 y.o. female who presents for evaluation of fall and head trauma. Patient reports that yesterdayshe tripped on a piece of wire that was on her driveway and fell forward hitting her face on the ground.she denies LOC. She is not on blood thinners. She has been taking Tylenol and putting ice for the pain. She is complaining of a constant, moderate, sharp and throbbing pain located in her forehead. She denies severe headache, neck pain, back pain, extremity pain, chest pain, abdominal pain. Last tetanus shot was less than 10 years.she denies any changes in her vision. She does endorse bad vision from cataracts however that is chronic.  Past Medical History:  Diagnosis Date  . Arthritis    hands  . Cancer [facility])    thyroid- radiation  . Diabetes mellitus without complication ([place])   . Hyperlipidemia   . Hypertension   . Hypothyroidism   . IBS (irritable bowel syndrome)     There are no active problems to display for this patient.   Past Surgical History:  Procedure Laterality Date  . ABDOMINAL HYSTERECTOMY    . CATARACT EXTRACTION W/PHACO Left 12/21/2016   Procedure: CATARACT EXTRACTION PHACO AND INTRAOCULAR LENS PLACEMENT ([place])  left eye;  Surgeon: Leandrew Koyanagi, MD;  Location: [place];  Service: Ophthalmology;  Laterality: Left;  Diabetic - oral meds  . CATARACT EXTRACTION W/PHACO Right 01/25/2017   Procedure: CATARACT EXTRACTION PHACO AND INTRAOCULAR LENS PLACEMENT ([place])  right diabetic;  Surgeon: Leandrew Koyanagi, MD;  Location: [place];  Service: Ophthalmology;  Laterality: Right;  diabetic - oral meds  . CHOLECYSTECTOMY    . COLONOSCOPY WITH PROPOFOL N/A  10/26/2016   Procedure: COLONOSCOPY WITH PROPOFOL;  Surgeon: Manya Silvas, MD;  Location: [facility] ENDOSCOPY;  Service: Endoscopy;  Laterality: N/A;  . KNEE SURGERY      Prior to Admission medications   Medication Sig Start Date End Date Taking? Authorizing Provider  Biotin w/ Vitamins C & E (HAIR/SKIN/NAILS PO) Take by mouth daily.    [provider]  Chlorpheniramine Maleate (ALLERGY RELIEF PO) Take by mouth daily.    [provider]  Cholecalciferol (VITAMIN D3 PO) Take by mouth daily.    [provider]  Levothyroxine Sodium (SYNTHROID PO) Take by mouth.    [provider]  metFORMIN (GLUCOPHAGE) 500 MG tablet Take 500 mg by mouth 2 (two) times daily with a meal.    [provider]  naproxen sodium (ANAPROX) 220 MG tablet Take 220 mg by mouth daily.    [provider]  Olmesartan-Amlodipine-HCTZ 40-10-25 MG TABS Take by mouth daily.    [provider]  potassium chloride (K-DUR) 10 MEQ tablet Take 10 mEq by mouth daily.    [provider]  sertraline (ZOLOFT) 100 MG tablet Take 100 mg by mouth daily.    [provider]  simvastatin (ZOCOR) 20 MG tablet Take 20 mg by mouth daily.    [provider]    Allergies Penicillins and Sulfa antibiotics  Family History  Problem Relation Age of Onset  . Breast cancer Neg Hx     Social History Social History  Substance Use Topics  . Smoking status: Never Smoker  . Smokeless tobacco: Never Used  Comment: social as teenager  . Alcohol use No    Review of Systems Constitutional: Negative for fever. Eyes: Negative for visual changes. ENT: + facial injury. No neck injury Cardiovascular: Negative for chest injury. Respiratory: Negative for shortness of breath. Negative for chest wall injury. Gastrointestinal: Negative for abdominal pain or injury. Genitourinary: Negative for dysuria. Musculoskeletal: Negative for back injury, negative for arm or  leg pain. Skin: Negative for laceration/abrasions. Neurological: + head injury.   ____________________________________________   PHYSICAL EXAM:  VITAL SIGNS: ED Triage Vitals  Enc Vitals Group     BP 09/16/17 1434 (!) 132/56     Pulse Rate 09/16/17 1434 68     Resp 09/16/17 1434 20     Temp 09/16/17 1434 99.2 F (37.3 C)     Temp Source 09/16/17 1434 Oral     SpO2 09/16/17 1434 96 %     Weight 09/16/17 1432 175 lb (79.4 kg)     Height 09/16/17 1432 5\' 3"  (1.6 m)     Head Circumference --      Peak Flow --      Pain Score 09/16/17 1431 8     Pain Loc --      Pain Edu? --      Excl. in [place]? --     Constitutional: Alert and oriented. No acute distress. Does not appear intoxicated. HEENT Head: Normocephalic and atraumatic. Face: No facial bony tenderness. Stable midface. Bruise to the forehead Ears: No hemotympanum bilaterally. No Battle sign Eyes: No eye injury. PERRL. EOMI. No raccoon eyes although patient has b/l periorbital bruising including tarsal plate R>L Nose: Nontender. No epistaxis. No rhinorrhea. Bruising to the nose bridge Mouth/Throat: Mucous membranes are moist. No oropharyngeal blood. No dental injury. Airway patent without stridor. Normal voice. Neck: no C-collar in place. No midline c-spine tenderness.  Cardiovascular: Normal rate, regular rhythm. Normal and symmetric distal pulses are present in all extremities. Pulmonary/Chest: Chest wall is stable and nontender to palpation/compression. Normal respiratory effort. Breath sounds are normal. No crepitus.  Abdominal: Soft, nontender, non distended. Musculoskeletal: Nontender with normal full range of motion in all extremities. No deformities. No thoracic or lumbar midline spinal tenderness. Pelvis is stable. Skin: Skin is warm, dry and intact. No abrasions or contutions. Psychiatric: Speech and behavior are appropriate. Neurological: Normal speech and language. Moves all extremities to command. No gross focal  neurologic deficits are appreciated.  Glascow Coma Score: 4 - Opens eyes on own 6 - Follows simple motor commands 5 - Alert and oriented GCS: 15   ____________________________________________   LABS (all labs ordered are listed, but only abnormal results are displayed)  Labs Reviewed - No data to display ____________________________________________  EKG  none  ____________________________________________  RADIOLOGY  CT face/head/cspine:  1. No acute intracranial pathology. 2. No acute osseous injury of the maxillofacial bones. 3. No acute osseous injury of the cervical spine. ____________________________________________   PROCEDURES  Procedure(s) performed: None Procedures Critical Care performed:  None ____________________________________________   INITIAL IMPRESSION / ASSESSMENT AND PLAN / ED COURSE  71 y.o. female who presents for evaluation of fall and head trauma yesterday after tripping on a wire and falling face down onto the gravel driveway. no signs or symptoms of basilar skull fracture. Patient does have bruising to the for hand, bilateral eyes right greater than left with involvement of the tarsal plate and nose bridge. No hemotympanum, she is neurologically intact. No other injuries seen on exam. Patient is not on blood thinners and  this accident happened 24 hours ago. Head CT is negative for acute bleed. CT face and cervical spine were no acute findings. Tetanus shot is up-to-date. we'll discharge home on supportive care and close follow-up with primary care doctor. Patient has an appointment scheduled in 3 days. Discussed return precautions with patient and her daughter.      As part of my medical decision making, I reviewed the following data within the [place] History obtained from family, Nursing notes reviewed and incorporated, Radiograph reviewed , Notes from prior ED visits and [place] Controlled Substance Database    Pertinent  labs & imaging results that were available during my care of the patient were reviewed by me and considered in my medical decision making (see chart for details).    ____________________________________________   FINAL CLINICAL IMPRESSION(S) / ED DIAGNOSES  Final diagnoses:  Fall, initial encounter  Injury of head, initial encounter  Contusion of face, initial encounter      NEW MEDICATIONS STARTED DURING THIS VISIT:  New Prescriptions   No medications on file     Note:  This document was prepared using Dragon voice recognition software and may include unintentional dictation errors.    Rudene Re, MD 09/16/17 [phone]

## 2017-09-16 NOTE — ED Triage Notes (Signed)
Pt reports tripped and fell yesterday reports today headache, feeling sleepy "funny feeling in her eyes" no blurred vision, Denies LOC upon falling, Pt has mild swelling and noticeable bruising to face pt talks in complete sentences no respiratory distress noted

## 2017-09-26 ENCOUNTER — Other Ambulatory Visit: Payer: Self-pay | Admitting: Internal Medicine

## 2017-09-26 DIAGNOSIS — Z1231 Encounter for screening mammogram for malignant neoplasm of breast: Secondary | ICD-10-CM

## 2017-09-29 ENCOUNTER — Ambulatory Visit
Admission: RE | Admit: 2017-09-29 | Discharge: 2017-09-29 | Disposition: A | Discharge status: Home / Self Care | Payer: Medicare Other | Source: Ambulatory Visit | Attending: Internal Medicine | Admitting: Internal Medicine

## 2017-09-29 DIAGNOSIS — Z1231 Encounter for screening mammogram for malignant neoplasm of breast: Secondary | ICD-10-CM | POA: Diagnosis not present

## 2017-11-23 ENCOUNTER — Encounter

## 2017-11-23 MED ORDER — MONTELUKAST 10 MG TAB
10 mg | ORAL_TABLET | Freq: Every day | ORAL | 1 refills | Status: DC
Start: 2017-11-23 — End: 2018-06-08

## 2017-11-23 NOTE — Telephone Encounter (Signed)
Received fax request from pharmacy for refills for 90 day supply, ok to send in per Dr Barksdale via protocol, rx sent in

## 2017-12-08 ENCOUNTER — Institutional Professional Consult (permissible substitution): Admit: 2017-12-08 | Discharge: 2017-12-08 | Payer: BLUE CROSS/BLUE SHIELD | Primary: Family Medicine

## 2017-12-08 DIAGNOSIS — Z23 Encounter for immunization: Secondary | ICD-10-CM

## 2017-12-08 NOTE — Patient Instructions (Signed)
Influenza (Flu) Vaccine (Inactivated or Recombinant): What You Need to Know  Why get vaccinated?  Influenza ("flu") is a contagious disease that spreads around the [place] every winter, usually between October and May.  Flu is caused by influenza viruses and is spread mainly by coughing, sneezing, and close contact.  Anyone can get flu. Flu strikes suddenly and can last several days. Symptoms vary by age, but can include:  ?? Fever/chills.  ?? Sore throat.  ?? Muscle aches.  ?? Fatigue.  ?? Cough.  ?? Headache.  ?? Runny or stuffy nose.  Flu can also lead to pneumonia and blood infections, and cause diarrhea and seizures in children. If you have a medical condition, such as heart or lung disease, flu can make it worse.  Flu is more dangerous for some people. Infants and young children, people 65 years of age and older, pregnant women, and people with certain health conditions or a weakened immune system are at greatest risk.  Each year thousands of people in the [place] die from flu, and many more are hospitalized.  Flu vaccine can:  ?? Keep you from getting flu.  ?? Make flu less severe if you do get it.  ?? Keep you from spreading flu to your family and other people.  Inactivated and recombinant flu vaccines  A dose of flu vaccine is recommended every flu season. Children 6 months through 8 years of age may need two doses during the same flu season. Everyone else needs only one dose each flu season.  Some inactivated flu vaccines contain a very small amount of a mercury-based preservative called thimerosal. Studies have not shown thimerosal in vaccines to be harmful, but flu vaccines that do not contain thimerosal are available.  There is no live flu virus in flu shots. They cannot cause the flu.  There are many flu viruses, and they are always changing. Each year a new flu vaccine is made to protect against three or four viruses that are likely to cause disease in the upcoming flu season. But even when the  vaccine doesn't exactly match these viruses, it may still provide some protection.  Flu vaccine cannot prevent:  ?? Flu that is caused by a virus not covered by the vaccine.  ?? Illnesses that look like flu but are not.  Some people should not get this vaccine  Tell the person who is giving you the vaccine:  ?? If you have any severe (life-threatening) allergies. If you ever had a life-threatening allergic reaction after a dose of flu vaccine, or have a severe allergy to any part of this vaccine, you may be advised not to get vaccinated. Most, but not all, types of flu vaccine contain a small amount of egg protein.  ?? If you ever had Guillain-Barr?? syndrome (also called GBS) Some people with a history of GBS should not get this vaccine. This should be discussed with your doctor.  ?? If you are not feeling well. It is usually okay to get flu vaccine when you have a mild illness, but you might be asked to come back when you feel better.  Risks of a vaccine reaction  With any medicine, including vaccines, there is a chance of reactions. These are usually mild and go away on their own, but serious reactions are also possible.  Most people who get a flu shot do not have any problems with it.  Minor problems following a flu shot include:  ?? Soreness, redness, or swelling   where the shot was given  ?? Hoarseness  ?? Sore, red or itchy eyes  ?? Cough  ?? Fever  ?? Aches  ?? Headache  ?? Itching  ?? Fatigue  If these problems occur, they usually begin soon after the shot and last 1 or 2 days.  More serious problems following a flu shot can include the following:  ?? There may be a small increased risk of Guillain-Barr?? Syndrome (GBS) after inactivated flu vaccine. This risk has been estimated at 1 or 2 additional cases per million people vaccinated. This is much lower than the risk of severe complications from flu, which can be prevented by flu vaccine.  ?? Young children who get the flu shot along with pneumococcal vaccine  (PCV13) and/or DTaP vaccine at the same time might be slightly more likely to have a seizure caused by fever. Ask your doctor for more information. Tell your doctor if a child who is getting flu vaccine has ever had a seizure  Problems that could happen after any injected vaccine:  ?? People sometimes faint after a medical procedure, including vaccination. Sitting or lying down for about 15 minutes can help prevent fainting, and injuries caused by a fall. Tell your doctor if you feel dizzy, or have vision changes or ringing in the ears.  ?? Some people get severe pain in the shoulder and have difficulty moving the arm where a shot was given. This happens very rarely.  ?? Any medication can cause a severe allergic reaction. Such reactions from a vaccine are very rare, estimated at about 1 in a million doses, and would happen within a few minutes to a few hours after the vaccination.  As with any medicine, there is a very remote chance of a vaccine causing a serious injury or death.  The safety of vaccines is always being monitored. For more information, visit: www.cdc.gov/vaccinesafety/.  What if there is a serious reaction?  What should I look for?  ?? Look for anything that concerns you, such as signs of a severe allergic reaction, very high fever, or unusual behavior.  Signs of a severe allergic reaction can include hives, swelling of the face and throat, difficulty breathing, a fast heartbeat, dizziness, and weakness ??? usually within a few minutes to a few hours after the vaccination.  What should I do?  ?? If you think it is a severe allergic reaction or other emergency that can't wait, call 9-1-1 and get the person to the nearest hospital. Otherwise, call your doctor.  ?? Reactions should be reported to the "Vaccine Adverse Event Reporting System" (VAERS). Your doctor should file this report, or you can do it yourself through the VAERS website at www.vaers.hhs.gov, or by calling 1-800-822-7967.   VAERS does not give medical advice.  The National Vaccine Injury Compensation Program  The National Vaccine Injury Compensation Program (VICP) is a federal program that was created to compensate people who may have been injured by certain vaccines.  Persons who believe they may have been injured by a vaccine can learn about the program and about filing a claim by calling 1-800-338-2382 or visiting the VICP website at www.hrsa.gov/vaccinecompensation. There is a time limit to file a claim for compensation.  How can I learn more?  ?? Ask your healthcare provider. He or she can give you the vaccine package insert or suggest other sources of information.  ?? Call your local or state health department.  ?? Contact the Centers for Disease Control and Prevention (CDC):  ?   Call 1-800-232-4636 (1-800-CDC-INFO) or  ? Visit CDC's website at www.cdc.gov/flu  Vaccine Information Statement  Inactivated Influenza Vaccine  07/04/2014)  42 [place] ?? 300aa-26  Department of Health and Human Services  Centers for Disease Control and Prevention  Many Vaccine Information Statements are available in Spanish and other languages. See www.immunize.org/vis.  Muchas hojas de informaci??n sobre vacunas est??n disponibles en espa??ol y en otros idiomas. Visite www.immunize.org/vis.  Care instructions adapted under license by [employer] (which disclaims liability or warranty for this information). If you have questions about a medical condition or this instruction, always ask your healthcare professional. [employer] disclaims any warranty or liability for your use of this information.

## 2017-12-08 NOTE — Progress Notes (Signed)
Patient presents today to receive her Flu vaccine. Immunization was well tolerated in the Right Deltoid, IM. She did not see the physician today.

## 2017-12-28 LAB — HM MAMMOGRAPHY

## 2018-01-02 ENCOUNTER — Encounter

## 2018-01-03 ENCOUNTER — Encounter: Admit: 2018-01-03 | Discharge: 2018-01-03 | Payer: BLUE CROSS/BLUE SHIELD | Primary: Family Medicine

## 2018-01-03 DIAGNOSIS — E782 Mixed hyperlipidemia: Secondary | ICD-10-CM

## 2018-01-04 LAB — THYROID CASCADE PROFILE: TSH: 4.22 u[IU]/mL (ref 0.450–4.500)

## 2018-01-04 LAB — CBC WITH AUTOMATED DIFF
ABS. BASOPHILS: 0 10*3/uL (ref 0.0–0.2)
ABS. EOSINOPHILS: 0.1 10*3/uL (ref 0.0–0.4)
ABS. IMM. GRANS.: 0 10*3/uL (ref 0.0–0.1)
ABS. MONOCYTES: 0.4 10*3/uL (ref 0.1–0.9)
ABS. NEUTROPHILS: 1.3 10*3/uL — ABNORMAL LOW (ref 1.4–7.0)
Abs Lymphocytes: 1.8 10*3/uL (ref 0.7–3.1)
BASOPHILS: 1 %
EOSINOPHILS: 2 %
HCT: 39.9 % (ref 34.0–46.6)
HGB: 13.4 g/dL (ref 11.1–15.9)
IMMATURE GRANULOCYTES: 0 %
Lymphocytes: 49 %
MCH: 32.2 pg (ref 26.6–33.0)
MCHC: 33.6 g/dL (ref 31.5–35.7)
MCV: 96 fL (ref 79–97)
MONOCYTES: 12 %
NEUTROPHILS: 36 %
PLATELET: 310 10*3/uL (ref 150–379)
RBC: 4.16 x10E6/uL (ref 3.77–5.28)
RDW: 13.6 % (ref 12.3–15.4)
WBC: 3.7 10*3/uL (ref 3.4–10.8)

## 2018-01-04 LAB — HEMOGLOBIN A1C WITH EAG
Estimated average glucose: 114 mg/dL
Hemoglobin A1c: 5.6 % (ref 4.8–5.6)

## 2018-01-04 LAB — LIPID PANEL WITH LDL/HDL RATIO
Cholesterol, total: 134 mg/dL (ref 100–199)
HDL Cholesterol: 65 mg/dL (ref >39)
LDL, calculated: 59 mg/dL (ref 0–99)
LDL/HDL Ratio: 0.9 ratio (ref 0.0–3.2)
Triglyceride: 48 mg/dL (ref 0–149)
VLDL, calculated: 10 mg/dL (ref 5–40)

## 2018-01-04 LAB — METABOLIC PANEL, COMPREHENSIVE
A-G Ratio: 1.7 (ref 1.2–2.2)
ALT (SGPT): 13 IU/L (ref 0–32)
AST (SGOT): 15 IU/L (ref 0–40)
Albumin: 4.3 g/dL (ref 3.5–4.8)
Alk. phosphatase: 41 IU/L (ref 39–117)
BUN/Creatinine ratio: 14 (ref 12–28)
BUN: 13 mg/dL (ref 8–27)
Bilirubin, total: 0.3 mg/dL (ref 0.0–1.2)
CO2: 26 mmol/L (ref 20–29)
Calcium: 9.4 mg/dL (ref 8.7–10.3)
Chloride: 105 mmol/L (ref 96–106)
Creatinine: 0.92 mg/dL (ref 0.57–1.00)
GFR est AA: 72 mL/min/{1.73_m2} (ref >59)
GFR est non-AA: 63 mL/min/{1.73_m2} (ref >59)
GLOBULIN, TOTAL: 2.6 g/dL (ref 1.5–4.5)
Glucose: 100 mg/dL — ABNORMAL HIGH (ref 65–99)
Potassium: 4.7 mmol/L (ref 3.5–5.2)
Protein, total: 6.9 g/dL (ref 6.0–8.5)
Sodium: 143 mmol/L (ref 134–144)

## 2018-01-10 ENCOUNTER — Ambulatory Visit
Admit: 2018-01-10 | Discharge: 2018-01-10 | Payer: BLUE CROSS/BLUE SHIELD | Attending: Family Medicine | Primary: Family Medicine

## 2018-01-10 ENCOUNTER — Ambulatory Visit: Attending: Family Medicine | Primary: Family Medicine

## 2018-01-10 DIAGNOSIS — Z0000 Encounter for general adult medical examination without abnormal findings: Secondary | ICD-10-CM

## 2018-01-10 MED ORDER — ALBUTEROL SULFATE HFA 90 MCG/ACTUATION AEROSOL INHALER
90 mcg/actuation | RESPIRATORY_TRACT | 5 refills | Status: DC | PRN
Start: 2018-01-10 — End: 2019-01-09

## 2018-01-10 MED ORDER — ALENDRONATE 70 MG TAB
70 mg | ORAL_TABLET | ORAL | 5 refills | Status: DC
Start: 2018-01-10 — End: 2019-01-09

## 2018-01-10 NOTE — Progress Notes (Signed)
This is the Subsequent Medicare Annual Wellness Exam, performed 12 months or more after the Initial AWV or the last Subsequent AWV    I have reviewed the patient's medical history in detail and updated the computerized patient record.     History     Chief Complaint   Patient presents with   ??? Annual Wellness Visit   ??? Asthma     Patient is requesting refills of her ProAir.   ??? Osteoarthritis     Patient is requesting refills of her medications     Patient here today for annual wellness visit.  She asthma, osteoporosis, impaired fasting glucose, and history of DVT.  She is no longer on Coumadin.  She has not had any further blood clots.  She still uses her albuterol inhaler as needed.  She does not smoke.  She does have a lot of stress at home.  She is caring for her husband who has several medical problems.  She does try to stay active.  She is in a bowling league and goes bowling at least once a week.  She otherwise has no other physical complaints today.      Visit Vitals  BP 140/76 (BP 1 Location: Left arm, BP Patient Position: Sitting)   Pulse 78   Temp 96.7 ??F (35.9 ??C) (Oral)   Ht 5' 1.5" (1.562 m)   Wt 159 lb (72.1 kg)   SpO2 98%   BMI 29.56 kg/m??       Past Medical History:   Diagnosis Date   ??? Allergic rhinitis, cause unspecified 11/27/2012   ??? Contact dermatitis and other eczema, due to unspecified cause 11/27/2012   ??? Encounter for long-term (current) use of other medications 11/27/2012   ??? Long term (current) use of anticoagulants 11/27/2012   ??? Nonspecific abnormal electrocardiogram (ECG) (EKG) 11/27/2012   ??? Osteoporosis    ??? 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 ABDOMEN SURGERY PROC UNLISTED     ??? HX CHOLECYSTECTOMY     ??? HX GYN      Removed tubes   ??? VASCULAR SURGERY PROCEDURE UNLIST      Left leg striped the vein     Current Outpatient Medications   Medication Sig Dispense Refill    ??? albuterol (PROVENTIL HFA, VENTOLIN HFA, PROAIR HFA) 90 mcg/actuation inhaler Take 2 Puffs by inhalation every four (4) hours as needed for Wheezing (FOR SOB). 1 Inhaler 5   ??? alendronate (FOSAMAX) 70 mg tablet Take 1 Tab by mouth every seven (7) days. 4 Tab 5   ??? montelukast (SINGULAIR) 10 mg tablet Take 1 Tab by mouth daily. 90 Tab 1   ??? atorvastatin (LIPITOR) 40 mg tablet take 1 tablet by mouth once daily 30 Tab 5   ??? rivaroxaban (XARELTO) 20 mg tab tablet Take 1 Tab by mouth daily (with dinner). 30 Tab 5   ??? nitroglycerin (NITROSTAT) 0.4 mg SL tablet by SubLINGual route every five (5) minutes as needed for Chest Pain.     ??? calcium-vitamin D (OYSTER SHELL CALCIUM-VIT D3) 250-125 mg-unit tablet Take 1 Tab by mouth daily.       Allergies   Allergen Reactions   ??? Codeine Unable to Obtain     Family History   Problem Relation Age of Onset   ??? Diabetes Mother    ??? Cancer Father    ??? Asthma Father    ???  Heart Disease Father    ??? Diabetes Sister    ??? Cancer Brother      Social History     Tobacco Use   ??? Smoking status: Never Smoker   ??? Smokeless tobacco: Never Used   Substance Use Topics   ??? Alcohol use: No     Patient Active Problem List   Diagnosis Code   ??? Nonspecific abnormal electrocardiogram (ECG) (EKG) R94.31   ??? 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 Mild intermittent asthma without complication J45.20   ??? Impaired fasting glucose R73.01   ??? History of DVT (deep vein thrombosis) Z86.718       Depression Risk Factor Screening:     3 most recent PHQ Screens 01/10/2018   Little interest or pleasure in doing things Not at all   Feeling down, depressed, irritable, or hopeless Not at all   Total Score PHQ 2 0     Alcohol Risk Factor Screening:   You do not drink alcohol or very rarely.    Functional Ability and Level of Safety:   Hearing Loss  Hearing is good.    Activities of Daily Living  The home contains: no safety equipment.  Patient does total self care    Fall Risk   Fall Risk Assessment, last 12 mths 01/10/2018   Able to walk? Yes   Fall in past 12 months? No   Fall with injury? -   Number of falls in past 12 months -   Fall Risk Score -       Abuse Screen  Patient is not abused    Cognitive Screening   Evaluation of Cognitive Function:  Has your family/caregiver stated any concerns about your memory: no  Normal  Mini Mental State Exam 01/10/2018   What is the Year 1   What is the Season 1   What is the Date 1   What is the Day 1   What is the Month 1   Where are we State 1   Where are we Country 1   Where are we [place] or [place] 1   Where are we Floor 1   Name three objects, then ask the patient to say them 3   Serial sevens Subtract 7 from 100 in increments 5   Ask for the three objects repeated above 3   Name a pencil 1   Name a watch 1   Have the patient repeat this phrase "No ifs, ands, or buts" 1   Three stage command: Take the paper in your right hand 1   Fold the paper in half 1   Put the paper on the floor 1   Read and obey the following: CLOSE YOUR EYES 1   Have the patient write a sentence 1   Have the patient copy a figure 1   Mini Mental Score 30     Review of Systems   All other systems reviewed and are negative.      Physical Exam   Constitutional: She is oriented to person, place, and time. She appears well-developed and well-nourished.   HENT:   Head: Normocephalic and atraumatic.   She has some clear nasal drainage.  Some post tympanic effusion bilaterally.  Throat is mildly erythematous with posterior pharyngeal mucus drainage and no exudate.   Eyes: Pupils are equal, round, and reactive to light.   Neck: Normal range of motion. Neck supple.   Cardiovascular: Normal rate, regular  rhythm and normal heart sounds.   Pulmonary/Chest: Effort normal and breath sounds normal.   Musculoskeletal: Normal range of motion.   Neurological: She is alert and oriented to person, place, and time.   Psychiatric: She has a normal mood and affect. Her behavior is normal.  Judgment and thought content normal.         Patient Care Team   Patient Care Team:  Norval MortonBarksdale, Kenslei Hearty L, MD as PCP - General (Family Practice)    Assessment/Plan   Education and counseling provided:  Are appropriate based on today's review and evaluation  Pneumococcal Vaccine  Influenza Vaccine  Screening Mammography  Colorectal cancer screening tests  Cardiovascular screening blood test  Bone mass measurement (DEXA)  Screening for glaucoma    Diagnoses and all orders for this visit:    1. Medicare annual wellness visit, subsequent    2. Mild intermittent asthma without complication  -     albuterol (PROVENTIL HFA, VENTOLIN HFA, PROAIR HFA) 90 mcg/actuation inhaler; Take 2 Puffs by inhalation every four (4) hours as needed for Wheezing (FOR SOB).    3. Osteopetrosis  -     alendronate (FOSAMAX) 70 mg tablet; Take 1 Tab by mouth every seven (7) days.    4. Disorder of bone and cartilage  -     DEXA BONE DENSITY STUDY AXIAL; Future    5. Impaired fasting glucose    6. History of DVT (deep vein thrombosis)        Health Maintenance Due   Topic Date Due   ??? Shingrix Vaccine Age 72> (1 of 2) 03/21/1996   ??? GLAUCOMA SCREENING Q2Y  03/22/2011   ??? BREAST CANCER SCRN MAMMOGRAM  10/26/2017   ??? MEDICARE YEARLY EXAM  01/05/2018     We reviewed her labs which were all well controlled.  We did not add any additional medications.  She is being sent for follow-up bone density test.  She has been on Fosamax once a week.  We will follow-up depending on her bone density.  Continue albuterol 2 puffs 4 times daily as needed for intermittent asthma.  Avoid tobacco smoke.  Instructed pt to engage in regular aerobic exercise at least three to four times per week for at least 30 mins.  (150 mins)  Also encourage to reduce total daily calorie intake by 30%, reduce carbohydrate and fat intake.    Patient does not have a Power of Attorney a form was given her to fill out and return.  She had a Mammogram earlier this year it was negative.

## 2018-01-10 NOTE — Patient Instructions (Signed)
Medicare Wellness Visit, Female     The best way to live healthy is to have a lifestyle where you eat a well-balanced diet, exercise regularly, limit alcohol use, and quit all forms of tobacco/nicotine, if applicable.   Regular preventive services are another way to keep healthy. Preventive services (vaccines, screening tests, monitoring & exams) can help personalize your care plan, which helps you manage your own care. Screening tests can find health problems at the earliest stages, when they are easiest to treat.   [facility] follows the current, evidence-based guidelines published by the [place] [employer] (USPSTF) when recommending preventive services for our patients. Because we follow these guidelines, sometimes recommendations change over time as research supports it. (For example, mammograms used to be recommended annually. Even though Medicare will still pay for an annual mammogram, the newer guidelines recommend a mammogram every two years for women of average risk.)  Of course, you and your doctor may decide to screen more often for some diseases, based on your risk and your health status.   Preventive services for you include:  - Medicare offers their members a free annual wellness visit, which is time for you and your primary care provider to discuss and plan for your preventive service needs. Take advantage of this benefit every year!  -All adults over the age of 25 should receive the recommended pneumonia vaccines. Current USPSTF guidelines recommend a series of two vaccines for the best pneumonia protection.   -All adults should have a flu vaccine yearly and a tetanus vaccine every 10 years. All adults age 51 and older should receive a shingles vaccine once in their lifetime.    -A bone mass density test is recommended when a woman turns 65 to screen for osteoporosis. This test is only recommended one time, as a screening.  Some providers will use this same test as a disease monitoring tool if you already have osteoporosis.  -All adults age 70-70 who are overweight should have a diabetes screening test once every three years.   -Other screening tests and preventive services for persons with diabetes include: an eye exam to screen for diabetic retinopathy, a kidney function test, a foot exam, and stricter control over your cholesterol.   -Cardiovascular screening for adults with routine risk involves an electrocardiogram (ECG) at intervals determined by your doctor.   -Colorectal cancer screenings should be done for adults age 25-75 with no increased risk factors for colorectal cancer.  There are a number of acceptable methods of screening for this type of cancer. Each test has its own benefits and drawbacks. Discuss with your doctor what is most appropriate for you during your annual wellness visit. The different tests include: colonoscopy (considered the best screening method), a fecal occult blood test, a fecal DNA test, and sigmoidoscopy.  -Breast cancer screenings are recommended every other year for women of normal risk, age 10-74.  -Cervical cancer screenings for women over age 2 are only recommended with certain risk factors.   -All adults born between [birth year] and 1965 should be screened once for Hepatitis C.     Here is a list of your current Health Maintenance items (your personalized list of preventive services) with a due date:  Health Maintenance Due   Topic Date Due   ??? Shingles Vaccine (1 of 2) 03/21/1996   ??? Glaucoma Screening   03/22/2011   ??? Breast Cancer Screening  10/26/2017   ??? Annual Well Visit  01/05/2018  Learning About Cervical Cancer Screening  What is a cervical cancer screening test?    Cervical cancer screening tests check for cancer of the cervix. The cervix is the lower part of the uterus that opens into the vagina. The test can help your doctor find early changes in the cells that could lead to  cancer.  Two tests can be used to screen for cervical cancer. They may be used alone or together.  A Pap test.   This test looks for changes in the cells of the cervix. Abnormal cells may be a sign of cancer.  A human papillomavirus (HPV) test.   This test looks for the HPV virus. Some types of HPV can cause cancer.  During either test, the doctor or nurse will insert a tool called a speculum into your vagina. The speculum gently spreads apart the vaginal walls. It allows your doctor to see inside the vagina and the cervix. He or she uses a small swab or brush to collect cell samples from your cervix.  Try to schedule the test when you're not having your period. To get ready for the test, avoid douches, tampons, vaginal medicines, sprays, and powders for at least a day before you have the test.  When should you have a screening test?  These guidelines apply to women who are at average risk of cervical cancer. If you don't know your risk, talk with your doctor.  Women 21 to 29  ?? You can start having a Pap test at age 27. It is done every 3 years.  ?? You can start having the primary HPV test at age 33. It is done every 3 years.  Women 30 to 85  ?? If you had both a Pap test and an HPV test last time and both were normal, you can have a Pap plus an HPV test every 5 years.  ?? If you had only a Pap test last time, as long as your results are normal, you can have a Pap test every 3 years.  ?? If you had only an HPV test last time, as long as your results are normal, you can have an HPV test every 3 years.  Women 44 and older  ?? If you are age 62 or older, talk with your doctor about what's right for you. Women ages 46 and older may no longer need to be screened for cervical cancer. When to stop having screening tests depends on your medical history, your overall health, and your risk of cervical cell changes or cervical cancer.  What happens after the test?   The sample of cells taken during your test will be sent to a lab so that an expert can look at the cells. It usually takes a week or two to get the results back.  Pap tests  ?? A normal result means that the test didn't find any abnormal cells in the sample.  ?? An abnormal result can mean many things. Most of these aren't cancer. The results of your test may be abnormal because:  ? You have an infection of the vagina or cervix, such as a yeast infection.  ? You have an IUD (intrauterine device for birth control).  ? You have low estrogen levels after menopause that are causing the cells to change.  ? You have cell changes that may be a sign of precancer or cancer. The results are ranked based on how serious the changes might be.  HPV tests  ??  A normal result means that the test didn't find any high-risk HPV in the sample.  ?? An abnormal HPV test doesn't mean that you have cervical cancer. It may mean that you are infected with one or more high-risk types of HPV. This increases your chance of having cell changes that may be a sign of precancer or cancer.  Follow-up care is a key part of your treatment and safety. Be sure to make and go to all appointments, and call your doctor if you are having problems. It's also a good idea to know your test results and keep a list of the medicines you take.  Where can you learn more?  Go to InsuranceStats.cahttp://www.healthwise.net/GoodHelpConnections.  Enter P919 in the search box to learn more about "Learning About Cervical Cancer Screening."  Current as of: February 21, 2017  Content Version: 11.9  ?? 2006-2018 Healthwise, Inc[employer]. Care instructions adapted under license by [employer] (which disclaims liability or warranty for this information). If you have questions about a medical condition or this instruction, always ask your healthcare professional. Healthwise, Incorporated disclaims any warranty or liability for your use of this information.            Learning About Living Tonya Shaffer  What is a living will?  A living will is a legal form you use to write down the kind of care you want at the end of your life. It is used by the health professionals who will treat you if you aren't able to decide for yourself.  If you put your wishes in writing, your loved ones and others will know what kind of care you want. They won't need to guess. This can ease your mind and be helpful to others.  A living will is not the same as an estate or property will. An estate will explains what you want to happen with your money and property after you die.  Is a living will a legal document?  A living will is a legal document. Each state has its own laws about living [place]. If you move to another state, make sure that your living will is legal in the state where you now live. Or you might use a universal form that has been approved by many states. This kind of form can sometimes be completed and stored online. Your electronic copy will then be available wherever you have a connection to the Internet. In most cases, doctors will respect your wishes even if you have a form from a different state.  ?? You don't need an attorney to complete a living will. But legal advice can be helpful if your state's laws are unclear, your health history is complicated, or your family can't agree on what should be in your living will.  ?? You can change your living will at any time. Some people find that their wishes about end-of-life care change as their health changes.  ?? In addition to making a living will, think about completing a medical power of attorney form. This form lets you name the person you want to make end-of-life treatment decisions for you (your "health care agent") if you're not able to. Many hospitals and nursing homes will give you the forms you need to complete a living will and a medical power of attorney.  ?? Your living will is used only if you can't make or communicate decisions  for yourself anymore. If you become able to make decisions again, you can accept or refuse any treatment,  no matter what you wrote in your living will.  ?? Your state may offer an online registry. This is a place where you can store your living will online so the doctors and nurses who need to treat you can find it right away.  What should you think about when creating a living will?  Talk about your end-of-life wishes with your family members and your doctor. Let them know what you want. That way the people making decisions for you won't be surprised by your choices.  Think about these questions as you make your living will:  ?? Do you know enough about life support methods that might be used? If not, talk to your doctor so you know what might be done if you can't breathe on your own, your heart stops, or you're unable to swallow.  ?? What things would you still want to be able to do after you receive life-support methods? Would you want to be able to walk? To speak? To eat on your own? To live without the help of machines?  ?? If you have a choice, where do you want to be cared for? In your home? At a hospital or nursing home?  ?? Do you want certain religious practices performed if you become very ill?  ?? If you have a choice at the end of your life, where would you prefer to die? At home? In a hospital or nursing home? Somewhere else?  ?? Would you prefer to be buried or cremated?  ?? Do you want your organs to be donated after you die?  What should you do with your living will?  ?? Make sure that your family members and your health care agent have copies of your living will.  ?? Give your doctor a copy of your living will to keep in your medical record. If you have more than one doctor, make sure that each one has a copy.  ?? You may want to put a copy of your living will where it can be easily found.  Where can you learn more?  Go to StreetWrestling.at.   Enter [phone] in the search box to learn more about "Learning About Living [place]er."  Current as of: July 06, 2015  Content Version: 11.3  ?? 2006-2017 Healthwise, Inc[employer]. Care instructions adapted under license by [employer] (which disclaims liability or warranty for this information). If you have questions about a medical condition or this instruction, always ask your healthcare professional. [facility] any warranty or liability for your use of this information.

## 2018-03-03 ENCOUNTER — Encounter

## 2018-03-06 ENCOUNTER — Encounter

## 2018-03-06 MED ORDER — RIVAROXABAN 20 MG TAB
20 mg | ORAL_TABLET | Freq: Every day | ORAL | 5 refills | Status: DC
Start: 2018-03-06 — End: 2018-03-07

## 2018-03-07 MED ORDER — XARELTO 20 MG TABLET
20 mg | ORAL_TABLET | ORAL | 5 refills | Status: DC
Start: 2018-03-07 — End: 2018-09-10

## 2018-03-30 ENCOUNTER — Encounter

## 2018-04-04 MED ORDER — ATORVASTATIN 40 MG TAB
40 mg | ORAL_TABLET | ORAL | 0 refills | Status: DC
Start: 2018-04-04 — End: 2018-04-12

## 2018-04-12 ENCOUNTER — Encounter

## 2018-04-12 MED ORDER — ATORVASTATIN 40 MG TAB
40 mg | ORAL_TABLET | ORAL | 5 refills | Status: DC
Start: 2018-04-12 — End: 2018-10-11

## 2018-04-12 NOTE — Telephone Encounter (Signed)
Pt requested refills, ok per Dr Barksdale via protocol, rx sent in and patient is aware

## 2018-06-08 ENCOUNTER — Encounter

## 2018-06-13 MED ORDER — MONTELUKAST 10 MG TAB
10 mg | ORAL_TABLET | ORAL | 0 refills | Status: DC
Start: 2018-06-13 — End: 2018-09-27

## 2018-07-10 ENCOUNTER — Ambulatory Visit
Admit: 2018-07-10 | Discharge: 2018-07-10 | Payer: BLUE CROSS/BLUE SHIELD | Attending: Family Medicine | Primary: Family Medicine

## 2018-07-10 ENCOUNTER — Ambulatory Visit: Attending: Family Medicine | Primary: Family Medicine

## 2018-07-10 DIAGNOSIS — J4520 Mild intermittent asthma, uncomplicated: Secondary | ICD-10-CM

## 2018-07-10 NOTE — Progress Notes (Signed)
[facility]  Damarrion Mimbs L. Oletta Darter, M.D.  [address]  [name] [address]  Ph No:  [phone]  Fax:  [phone]    CHIEF COMPLAINT:  Chief Complaint   Patient presents with   ??? Other     Patient is requesting a  bone density and cologuard.   ??? Cholesterol Problem     Patient is here for follow up she has no concerns todday.   ??? Asthma     Patient is here for a follow up she currently has no issues.          HISTORY OF PRESENT ILLNESS:  Patient is here for routine follow-up.  She has a history of hypercholesterolemia, asthma, protein C deficiency disorder, and obesity.  She did have a mammogram earlier this year.  She has not had bone density test or colonoscopy.  She is requesting to do a Cologuard to screen for colon cancer.  She is planning to go to the beach this weekend with her family.  She takes Singulair daily and uses albuterol just as needed.  She states that she last used it about 4 months ago.  She denies any wheezing or shortness of breath.  She takes Xarelto daily for protein C deficiency.  She has history of multiple blood clots.  She also takes atorvastatin for hypercholesterolemia.  Tolerating this well.  No muscle aches or joint pain.  She has no other complaints.    HISTORY:  Allergies   Allergen Reactions   ??? Codeine Unable to Obtain     Past Medical History:   Diagnosis Date   ??? Allergic rhinitis, cause unspecified 11/27/2012   ??? Contact dermatitis and other eczema, due to unspecified cause 11/27/2012   ??? Encounter for long-term (current) use of other medications 11/27/2012   ??? Long term (current) use of anticoagulants 11/27/2012   ??? Nonspecific abnormal electrocardiogram (ECG) (EKG) 11/27/2012   ??? Osteoporosis    ??? 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 ABDOMEN SURGERY PROC UNLISTED     ??? HX CHOLECYSTECTOMY     ??? HX GYN      Removed tubes   ??? VASCULAR SURGERY PROCEDURE UNLIST      Left leg striped  the vein     Family History   Problem Relation Age of Onset   ??? Diabetes Mother    ??? Cancer Father    ??? Asthma Father    ??? Heart Disease Father    ??? Diabetes Sister    ??? Cancer Brother      Social History     Socioeconomic History   ??? Marital status: MARRIED     Spouse name: Not on file   ??? Number of children: Not on file   ??? Years of education: Not on file   ??? Highest education level: Not on file   Occupational History   ??? Not on file   Social Needs   ??? Financial resource strain: Not on file   ??? Food insecurity:     Worry: Not on file     Inability: Not on file   ??? Transportation needs:     Medical: Not on file     Non-medical: Not on file   Tobacco Use   ??? Smoking status: Never Smoker   ??? Smokeless tobacco: Never Used   Substance and Sexual Activity   ??? Alcohol use:  No   ??? Drug use: No   ??? Sexual activity: Not on file   Lifestyle   ??? Physical activity:     Days per week: Not on file     Minutes per session: Not on file   ??? Stress: Not on file   Relationships   ??? Social connections:     Talks on phone: Not on file     Gets together: Not on file     Attends religious service: Not on file     Active member of club or organization: Not on file     Attends meetings of clubs or organizations: Not on file     Relationship status: Not on file   ??? Intimate partner violence:     Fear of current or ex partner: Not on file     Emotionally abused: Not on file     Physically abused: Not on file     Forced sexual activity: Not on file   Other Topics Concern   ??? Not on file   Social History Narrative   ??? Not on file     Current Outpatient Medications   Medication Sig Dispense Refill   ??? montelukast (SINGULAIR) 10 mg tablet TAKE 1 TABLET BY MOUTH DAILY 90 Tab 0   ??? atorvastatin (LIPITOR) 40 mg tablet TAKE 1 TABLET BY MOUTH EVERY DAY 30 Tab 5   ??? XARELTO 20 mg tab tablet TAKE 1 TABLET BY MOUTH DAILY WITH DINNER 30 Tab 5   ??? albuterol (PROVENTIL HFA, VENTOLIN HFA, PROAIR HFA) 90 mcg/actuation inhaler Take 2 Puffs by inhalation every  four (4) hours as needed for Wheezing (FOR SOB). 1 Inhaler 5   ??? alendronate (FOSAMAX) 70 mg tablet Take 1 Tab by mouth every seven (7) days. 4 Tab 5   ??? nitroglycerin (NITROSTAT) 0.4 mg SL tablet by SubLINGual route every five (5) minutes as needed for Chest Pain.     ??? calcium-vitamin D (OYSTER SHELL CALCIUM-VIT D3) 250-125 mg-unit tablet Take 1 Tab by mouth daily.         3 most recent PHQ Screens 07/10/2018   Little interest or pleasure in doing things Not at all   Feeling down, depressed, irritable, or hopeless Not at all   Total Score PHQ 2 0         Fall Risk Assessment, last 12 mths 07/10/2018   Able to walk? Yes   Fall in past 12 months? No   Fall with injury? -   Number of falls in past 12 months -   Fall Risk Score -                  REVIEW OF SYSTEMS:  ROS  Review of systems is as stated above, otherwise is negative.    PHYSICAL EXAM:  Vital Signs -   Visit Vitals  BP 114/66 (BP 1 Location: Left arm, BP Patient Position: Sitting)   Pulse 86   Temp 96.8 ??F (36 ??C) (Oral)   Ht 5' 1.5" (1.562 m)   Wt 167 lb (75.8 kg)   SpO2 98%   BMI 31.04 kg/m??      Physical Exam   Constitutional: She is oriented to person, place, and time. She appears well-developed and well-nourished.   HENT:   Head: Normocephalic and atraumatic.   She has some clear nasal drainage.  Some post tympanic effusion bilaterally.  Throat is mildly erythematous with posterior pharyngeal mucus drainage and no exudate.  Eyes: Pupils are equal, round, and reactive to light.   Neck: Normal range of motion. Neck supple.   Cardiovascular: Normal rate, regular rhythm and normal heart sounds.   Pulmonary/Chest: Effort normal and breath sounds normal.   Musculoskeletal: Normal range of motion.   Neurological: She is alert and oriented to person, place, and time.   Psychiatric: She has a normal mood and affect. Her behavior is normal. Judgment and thought content normal.            LABS  Results for orders placed or performed in visit on 60/10/93    METABOLIC PANEL, COMPREHENSIVE   Result Value Ref Range    Glucose 100 (H) 65 - 99 mg/dL    BUN 13 8 - 27 mg/dL    Creatinine 0.92 0.57 - 1.00 mg/dL    GFR est non-AA 63 >59 mL/min/1.73    GFR est AA 72 >59 mL/min/1.73    BUN/Creatinine ratio 14 12 - 28    Sodium 143 134 - 144 mmol/L    Potassium 4.7 3.5 - 5.2 mmol/L    Chloride 105 96 - 106 mmol/L    CO2 26 20 - 29 mmol/L    Calcium 9.4 8.7 - 10.3 mg/dL    Protein, total 6.9 6.0 - 8.5 g/dL    Albumin 4.3 3.5 - 4.8 g/dL    GLOBULIN, TOTAL 2.6 1.5 - 4.5 g/dL    A-G Ratio 1.7 1.2 - 2.2    Bilirubin, total 0.3 0.0 - 1.2 mg/dL    Alk. phosphatase 41 39 - 117 IU/L    AST (SGOT) 15 0 - 40 IU/L    ALT (SGPT) 13 0 - 32 IU/L   CBC WITH AUTOMATED DIFF   Result Value Ref Range    WBC 3.7 3.4 - 10.8 x10E3/uL    RBC 4.16 3.77 - 5.28 x10E6/uL    HGB 13.4 11.1 - 15.9 g/dL    HCT 39.9 34.0 - 46.6 %    MCV 96 79 - 97 fL    MCH 32.2 26.6 - 33.0 pg    MCHC 33.6 31.5 - 35.7 g/dL    RDW 13.6 12.3 - 15.4 %    PLATELET 310 150 - 379 x10E3/uL    NEUTROPHILS 36 Not Estab. %    Lymphocytes 49 Not Estab. %    MONOCYTES 12 Not Estab. %    EOSINOPHILS 2 Not Estab. %    BASOPHILS 1 Not Estab. %    ABS. NEUTROPHILS 1.3 (L) 1.4 - 7.0 x10E3/uL    Abs Lymphocytes 1.8 0.7 - 3.1 x10E3/uL    ABS. MONOCYTES 0.4 0.1 - 0.9 x10E3/uL    ABS. EOSINOPHILS 0.1 0.0 - 0.4 x10E3/uL    ABS. BASOPHILS 0.0 0.0 - 0.2 x10E3/uL    IMMATURE GRANULOCYTES 0 Not Estab. %    ABS. IMM. GRANS. 0.0 0.0 - 0.1 x10E3/uL   LIPID PANEL WITH LDL/HDL RATIO   Result Value Ref Range    Cholesterol, total 134 100 - 199 mg/dL    Triglyceride 48 0 - 149 mg/dL    HDL Cholesterol 65 >39 mg/dL    VLDL, calculated 10 5 - 40 mg/dL    LDL, calculated 59 0 - 99 mg/dL    LDL/HDL Ratio 0.9 0.0 - 3.2 ratio   THYROID CASCADE PROFILE   Result Value Ref Range    TSH 4.220 0.450 - 4.500 uIU/mL   HEMOGLOBIN A1C WITH EAG   Result Value Ref Range    Hemoglobin  A1c 5.6 4.8 - 5.6 %    Estimated average glucose 114 mg/dL            IMPRESSION/PLAN    Diagnoses and all orders for this visit:    1. Mild intermittent asthma without complication    2. Protein C deficiency ([place])    3. History of DVT (deep vein thrombosis)    4. Mixed hyperlipidemia    5. Screening for osteoporosis  -     DEXA BONE DENSITY STUDY AXIAL; Future    6. Other specified disorders of bone density and structure, other site   -     DEXA BONE DENSITY STUDY AXIAL; Future    7. Screening for colon cancer  -     COLOGUARD TEST (FECAL DNA COLORECTAL CANCER SCREENING)        She has well-controlled mild intermittent asthma.  Continue to use albuterol as needed for wheezing shortness of breath.  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  Continue atorvastatin 20 mg daily for hypercholesterolemia.  We are arranging for bone density test to screen for osteoporosis.  Arranging for Cologuard to screen for colon cancer.  We will see her back in 6 months.  We will check labs at that time including an A1c as she has had some elevated fasting glucose levels in the past.  Otherwise follow-up here as needed.      Craig Staggers, MD            Dictated using voice recognition software. Proofread, but unrecognized voice recognition errors may exist.

## 2018-07-10 NOTE — Progress Notes (Signed)
[facility]  Makenzye Troutman L. Oletta Darter, M.D.  [address] [name] [address]  Ph No:  [phone]  Fax:  [phone]    CHIEF COMPLAINT:  Chief Complaint   Patient presents with   ??? Other     Patient is requesting a  bone density and cologuard.   ??? Cholesterol Problem     Patient is here for follow up she has no concerns todday.   ??? Asthma     Patient is here for a follow up she currently has no issues.          HISTORY OF PRESENT ILLNESS:  Patient is here for routine follow-up.  She has a history of hypercholesterolemia, asthma, protein C deficiency disorder, and obesity.  She did have a mammogram earlier this year.  She has not had bone density test or colonoscopy.  She is requesting to do a Cologuard to screen for colon cancer.  She is planning to go to the beach this weekend with her family.  She takes Singulair daily and uses albuterol just as needed.  She states that she last used it about 4 months ago.  She denies any wheezing or shortness of breath.  She takes Xarelto daily for protein C deficiency.  She has history of multiple blood clots.  She also takes atorvastatin for hypercholesterolemia.  Tolerating this well.  No muscle aches or joint pain.  She has no other complaints.    HISTORY:  Allergies   Allergen Reactions   ??? Codeine Unable to Obtain     Past Medical History:   Diagnosis Date   ??? Allergic rhinitis, cause unspecified 11/27/2012   ??? Contact dermatitis and other eczema, due to unspecified cause 11/27/2012   ??? Encounter for long-term (current) use of other medications 11/27/2012   ??? Long term (current) use of anticoagulants 11/27/2012   ??? Nonspecific abnormal electrocardiogram (ECG) (EKG) 11/27/2012   ??? Osteoporosis    ??? 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 ABDOMEN SURGERY PROC UNLISTED     ??? HX CHOLECYSTECTOMY     ??? HX GYN      Removed tubes   ??? VASCULAR SURGERY PROCEDURE UNLIST       Left leg striped the vein     Family History   Problem Relation Age of Onset   ??? Diabetes Mother    ??? Cancer Father    ??? Asthma Father    ??? Heart Disease Father    ??? Diabetes Sister    ??? Cancer Brother      Social History     Socioeconomic History   ??? Marital status: MARRIED     Spouse name: Not on file   ??? Number of children: Not on file   ??? Years of education: Not on file   ??? Highest education level: Not on file   Occupational History   ??? Not on file   Social Needs   ??? Financial resource strain: Not on file   ??? Food insecurity:     Worry: Not on file     Inability: Not on file   ??? Transportation needs:     Medical: Not on file     Non-medical: Not on file   Tobacco Use   ??? Smoking status: Never Smoker   ??? Smokeless tobacco: Never Used   Substance and Sexual Activity   ??? Alcohol use:  No   ??? Drug use: No   ??? Sexual activity: Not on file   Lifestyle   ??? Physical activity:     Days per week: Not on file     Minutes per session: Not on file   ??? Stress: Not on file   Relationships   ??? Social connections:     Talks on phone: Not on file     Gets together: Not on file     Attends religious service: Not on file     Active member of club or organization: Not on file     Attends meetings of clubs or organizations: Not on file     Relationship status: Not on file   ??? Intimate partner violence:     Fear of current or ex partner: Not on file     Emotionally abused: Not on file     Physically abused: Not on file     Forced sexual activity: Not on file   Other Topics Concern   ??? Not on file   Social History Narrative   ??? Not on file     Current Outpatient Medications   Medication Sig Dispense Refill   ??? montelukast (SINGULAIR) 10 mg tablet TAKE 1 TABLET BY MOUTH DAILY 90 Tab 0   ??? atorvastatin (LIPITOR) 40 mg tablet TAKE 1 TABLET BY MOUTH EVERY DAY 30 Tab 5   ??? XARELTO 20 mg tab tablet TAKE 1 TABLET BY MOUTH DAILY WITH DINNER 30 Tab 5    ??? albuterol (PROVENTIL HFA, VENTOLIN HFA, PROAIR HFA) 90 mcg/actuation inhaler Take 2 Puffs by inhalation every four (4) hours as needed for Wheezing (FOR SOB). 1 Inhaler 5   ??? alendronate (FOSAMAX) 70 mg tablet Take 1 Tab by mouth every seven (7) days. 4 Tab 5   ??? nitroglycerin (NITROSTAT) 0.4 mg SL tablet by SubLINGual route every five (5) minutes as needed for Chest Pain.     ??? calcium-vitamin D (OYSTER SHELL CALCIUM-VIT D3) 250-125 mg-unit tablet Take 1 Tab by mouth daily.         3 most recent PHQ Screens 07/10/2018   Little interest or pleasure in doing things Not at all   Feeling down, depressed, irritable, or hopeless Not at all   Total Score PHQ 2 0         Fall Risk Assessment, last 12 mths 07/10/2018   Able to walk? Yes   Fall in past 12 months? No   Fall with injury? -   Number of falls in past 12 months -   Fall Risk Score -                  REVIEW OF SYSTEMS:  ROS  Review of systems is as stated above, otherwise is negative.    PHYSICAL EXAM:  Vital Signs -   Visit Vitals  BP 114/66 (BP 1 Location: Left arm, BP Patient Position: Sitting)   Pulse 86   Temp 96.8 ??F (36 ??C) (Oral)   Ht 5' 1.5" (1.562 m)   Wt 167 lb (75.8 kg)   SpO2 98%   BMI 31.04 kg/m??      Physical Exam   Constitutional: She is oriented to person, place, and time. She appears well-developed and well-nourished.   HENT:   Head: Normocephalic and atraumatic.   She has some clear nasal drainage.  Some post tympanic effusion bilaterally.  Throat is mildly erythematous with posterior pharyngeal mucus drainage and no exudate.  Eyes: Pupils are equal, round, and reactive to light.   Neck: Normal range of motion. Neck supple.   Cardiovascular: Normal rate, regular rhythm and normal heart sounds.   Pulmonary/Chest: Effort normal and breath sounds normal.   Musculoskeletal: Normal range of motion.   Neurological: She is alert and oriented to person, place, and time.    Psychiatric: She has a normal mood and affect. Her behavior is normal. Judgment and thought content normal.            LABS  Results for orders placed or performed in visit on 92/01/00   METABOLIC PANEL, COMPREHENSIVE   Result Value Ref Range    Glucose 100 (H) 65 - 99 mg/dL    BUN 13 8 - 27 mg/dL    Creatinine 0.92 0.57 - 1.00 mg/dL    GFR est non-AA 63 >59 mL/min/1.73    GFR est AA 72 >59 mL/min/1.73    BUN/Creatinine ratio 14 12 - 28    Sodium 143 134 - 144 mmol/L    Potassium 4.7 3.5 - 5.2 mmol/L    Chloride 105 96 - 106 mmol/L    CO2 26 20 - 29 mmol/L    Calcium 9.4 8.7 - 10.3 mg/dL    Protein, total 6.9 6.0 - 8.5 g/dL    Albumin 4.3 3.5 - 4.8 g/dL    GLOBULIN, TOTAL 2.6 1.5 - 4.5 g/dL    A-G Ratio 1.7 1.2 - 2.2    Bilirubin, total 0.3 0.0 - 1.2 mg/dL    Alk. phosphatase 41 39 - 117 IU/L    AST (SGOT) 15 0 - 40 IU/L    ALT (SGPT) 13 0 - 32 IU/L   CBC WITH AUTOMATED DIFF   Result Value Ref Range    WBC 3.7 3.4 - 10.8 x10E3/uL    RBC 4.16 3.77 - 5.28 x10E6/uL    HGB 13.4 11.1 - 15.9 g/dL    HCT 39.9 34.0 - 46.6 %    MCV 96 79 - 97 fL    MCH 32.2 26.6 - 33.0 pg    MCHC 33.6 31.5 - 35.7 g/dL    RDW 13.6 12.3 - 15.4 %    PLATELET 310 150 - 379 x10E3/uL    NEUTROPHILS 36 Not Estab. %    Lymphocytes 49 Not Estab. %    MONOCYTES 12 Not Estab. %    EOSINOPHILS 2 Not Estab. %    BASOPHILS 1 Not Estab. %    ABS. NEUTROPHILS 1.3 (L) 1.4 - 7.0 x10E3/uL    Abs Lymphocytes 1.8 0.7 - 3.1 x10E3/uL    ABS. MONOCYTES 0.4 0.1 - 0.9 x10E3/uL    ABS. EOSINOPHILS 0.1 0.0 - 0.4 x10E3/uL    ABS. BASOPHILS 0.0 0.0 - 0.2 x10E3/uL    IMMATURE GRANULOCYTES 0 Not Estab. %    ABS. IMM. GRANS. 0.0 0.0 - 0.1 x10E3/uL   LIPID PANEL WITH LDL/HDL RATIO   Result Value Ref Range    Cholesterol, total 134 100 - 199 mg/dL    Triglyceride 48 0 - 149 mg/dL    HDL Cholesterol 65 >39 mg/dL    VLDL, calculated 10 5 - 40 mg/dL    LDL, calculated 59 0 - 99 mg/dL    LDL/HDL Ratio 0.9 0.0 - 3.2 ratio   THYROID CASCADE PROFILE   Result Value Ref Range     TSH 4.220 0.450 - 4.500 uIU/mL   HEMOGLOBIN A1C WITH EAG   Result Value Ref Range  Hemoglobin A1c 5.6 4.8 - 5.6 %    Estimated average glucose 114 mg/dL           IMPRESSION/PLAN    Diagnoses and all orders for this visit:    1. Mild intermittent asthma without complication    2. Protein C deficiency ([place])    3. History of DVT (deep vein thrombosis)    4. Mixed hyperlipidemia    5. Screening for osteoporosis  -     DEXA BONE DENSITY STUDY AXIAL; Future    6. Other specified disorders of bone density and structure, other site   -     DEXA BONE DENSITY STUDY AXIAL; Future    7. Screening for colon cancer  -     COLOGUARD TEST (FECAL DNA COLORECTAL CANCER SCREENING)        She has well-controlled mild intermittent asthma.  Continue to use albuterol as needed for wheezing shortness of breath.  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  Continue atorvastatin 20 mg daily for hypercholesterolemia.  We are arranging for bone density test to screen for osteoporosis.  Arranging for Cologuard to screen for colon cancer.  We will see her back in 6 months.  We will check labs at that time including an A1c as she has had some elevated fasting glucose levels in the past.  Otherwise follow-up here as needed.      Craig Staggers, MD            Dictated using voice recognition software. Proofread, but unrecognized voice recognition errors may exist.

## 2018-08-01 LAB — FECAL DNA COLORECTAL CANCER SCREENING (COLOGUARD): FIT-DNA (Cologuard): NEGATIVE

## 2018-08-27 ENCOUNTER — Ambulatory Visit: Payer: BLUE CROSS/BLUE SHIELD | Primary: Family Medicine

## 2018-08-28 ENCOUNTER — Encounter

## 2018-09-10 ENCOUNTER — Telehealth

## 2018-09-10 MED ORDER — RIVAROXABAN 20 MG TAB
20 mg | ORAL_TABLET | ORAL | 5 refills | Status: DC
Start: 2018-09-10 — End: 2019-01-09

## 2018-09-10 NOTE — Telephone Encounter (Signed)
rx sent in and patient is aware

## 2018-09-10 NOTE — Telephone Encounter (Signed)
Xarelto wants to know if you will call in

## 2018-09-11 ENCOUNTER — Other Ambulatory Visit: Payer: Self-pay | Admitting: Internal Medicine

## 2018-09-11 DIAGNOSIS — Z1231 Encounter for screening mammogram for malignant neoplasm of breast: Secondary | ICD-10-CM

## 2018-09-27 ENCOUNTER — Encounter

## 2018-10-02 ENCOUNTER — Ambulatory Visit
Admission: RE | Admit: 2018-10-02 | Discharge: 2018-10-02 | Disposition: A | Discharge status: Home / Self Care | Payer: Medicare Other | Source: Ambulatory Visit | Attending: Internal Medicine | Admitting: Internal Medicine

## 2018-10-02 DIAGNOSIS — Z1231 Encounter for screening mammogram for malignant neoplasm of breast: Secondary | ICD-10-CM | POA: Insufficient documentation

## 2018-10-03 MED ORDER — MONTELUKAST 10 MG TAB
10 mg | ORAL_TABLET | ORAL | 1 refills | Status: DC
Start: 2018-10-03 — End: 2018-10-29

## 2018-10-11 ENCOUNTER — Encounter

## 2018-10-17 ENCOUNTER — Institutional Professional Consult (permissible substitution): Admit: 2018-10-17 | Discharge: 2018-10-17 | Payer: BLUE CROSS/BLUE SHIELD | Primary: Family Medicine

## 2018-10-17 DIAGNOSIS — Z23 Encounter for immunization: Secondary | ICD-10-CM

## 2018-10-17 MED ORDER — ATORVASTATIN 40 MG TAB
40 mg | ORAL_TABLET | ORAL | 5 refills | Status: DC
Start: 2018-10-17 — End: 2018-10-29

## 2018-10-17 NOTE — Progress Notes (Signed)
Pt here for flu shot only not seen by provider

## 2018-10-17 NOTE — Telephone Encounter (Signed)
Pt left message stating she wanted to schedule her flu shot. Left message to call back to schedule apt

## 2018-10-17 NOTE — Telephone Encounter (Signed)
Apt made

## 2018-10-17 NOTE — Patient Instructions (Signed)
Vaccine Information Statement    Influenza (Flu) Vaccine (Inactivated or Recombinant): What You Need to Know    Many Vaccine Information Statements are available in Spanish and other languages. See www.immunize.org/vis  Hojas de informaci??n sobre vacunas est??n disponibles en espa??ol y en muchos otros idiomas. Visite www.immunize.org/vis    1. Why get vaccinated?    Influenza vaccine can prevent influenza (flu).    Flu is a contagious disease that spreads around the [place] every year, usually between October and May. Anyone can get the flu, but it is more dangerous for some people. Infants and young children, people 65 years of age and older, pregnant women, and people with certain health conditions or a weakened immune system are at greatest risk of flu complications.    Pneumonia, bronchitis, sinus infections and ear infections are examples of flu-related complications. If you have a medical condition, such as heart disease, cancer or diabetes, flu can make it worse.    Flu can cause fever and chills, sore throat, muscle aches, fatigue, cough, headache, and runny or stuffy nose. Some people may have vomiting and diarrhea, though this is more common in children than adults.     Each year thousands of people in the [place] die from flu, and many more are hospitalized. Flu vaccine prevents millions of illnesses and flu-related visits to the doctor each year.    2. Influenza vaccines     CDC recommends everyone 6 months of age and older get vaccinated every flu season. Children 6 months through 8 years of age may need 2 doses during a single flu season.  Everyone else needs only 1 dose each flu season.    It takes about 2 weeks for protection to develop after vaccination.    There are many flu viruses, and they are always changing. Each year a new flu vaccine is made to protect against three or four viruses that are likely to cause disease in the upcoming flu season. Even when the vaccine  doesn???t exactly match these viruses, it may still provide some protection.     Influenza vaccine does not cause flu.    Influenza vaccine may be given at the same time as other vaccines.    3. Talk with your health care provider    Tell your vaccine provider if the person getting the vaccine:  ??? Has had an allergic reaction after a previous dose of influenza vaccine, or has any severe, life-threatening allergies.   ??? Has ever had Guillain-Barr?? Syndrome (also called GBS).    In some cases, your health care provider may decide to postpone influenza vaccination to a future visit.    People with minor illnesses, such as a cold, may be vaccinated. People who are moderately or severely ill should usually wait until they recover before getting influenza vaccine.    Your health care provider can give you more information.    4. Risks of a reaction    ??? Soreness, redness, and swelling where shot is given, fever, muscle aches, and headache can happen after influenza vaccine.  ??? There may be a very small increased risk of Guillain-Barr?? Syndrome (GBS) after inactivated influenza vaccine (the flu shot).    Young children who get the flu shot along with pneumococcal vaccine (PCV13), and/or DTaP vaccine at the same time might be slightly more likely to have a seizure caused by fever. Tell your health care provider if a child who is getting flu vaccine has ever had a   seizure.    People sometimes faint after medical procedures, including vaccination. Tell your provider if you feel dizzy or have vision changes or ringing in the ears.    As with any medicine, there is a very remote chance of a vaccine causing a severe allergic reaction, other serious injury, or death.    5. What if there is a serious problem?    An allergic reaction could occur after the vaccinated person leaves the clinic. If you see signs of a severe allergic reaction (hives, swelling of the face and throat, difficulty breathing, a fast heartbeat, dizziness, or  weakness), call 9-1-1 and get the person to the nearest hospital.    For other signs that concern you, call your health care provider.    Adverse reactions should be reported to the Vaccine Adverse Event Reporting System (VAERS). Your health care provider will usually file this report, or you can do it yourself. Visit the VAERS website at www.vaers.hhs.gov or call 1-800-822-7967.  VAERS is only for reporting reactions, and VAERS staff do not give medical advice.    6. The National Vaccine Injury Compensation Program    The National Vaccine Injury Compensation Program (VICP) is a federal program that was created to compensate people who may have been injured by certain vaccines. Visit the VICP website at www.hrsa.gov/vaccinecompensation or call 1-800-338-2382 to learn about the program and about filing a claim. There is a time limit to file a claim for compensation.    7. How can I learn more?    ??? Ask your health care provider.   ??? Call your local or state health department.  ??? Contact the Centers for Disease Control and Prevention (CDC):  - Call 1-800-232-4636 (1-800-CDC-INFO) or  - Visit CDC???s influenza website at www.cdc.gov/flu    Vaccine Information Statement (Interim)  Inactivated Influenza Vaccine   07/12/2018  42 U.[place] ?? [id number]   Department of Health and Human Services  Centers for Disease Control and Prevention    Office Use Only

## 2018-10-29 ENCOUNTER — Encounter

## 2018-10-29 MED ORDER — ATORVASTATIN 40 MG TAB
40 mg | ORAL_TABLET | Freq: Every day | ORAL | 1 refills | Status: DC
Start: 2018-10-29 — End: 2019-06-24

## 2018-10-29 MED ORDER — MONTELUKAST 10 MG TAB
10 mg | ORAL_TABLET | ORAL | 1 refills | Status: DC
Start: 2018-10-29 — End: 2019-06-24

## 2018-10-29 NOTE — Telephone Encounter (Signed)
Pt requested refills, ok per Dr Barksdale via protocol, rx sent in and patient is aware

## 2019-01-02 ENCOUNTER — Encounter

## 2019-01-03 ENCOUNTER — Encounter: Admit: 2019-01-03 | Discharge: 2019-01-03 | Payer: MEDICARE | Primary: Family Medicine

## 2019-01-03 DIAGNOSIS — E782 Mixed hyperlipidemia: Secondary | ICD-10-CM

## 2019-01-04 LAB — LIPID PANEL WITH LDL/HDL RATIO
Cholesterol, Total: 147 mg/dL (ref 100–199)
Cholesterol, total: 147 mg/dL (ref 100–199)
HDL Cholesterol: 62 mg/dL (ref >39)
HDL: 62 mg/dL (ref >39)
LDL Calculated: 69 mg/dL (ref 0–99)
LDL, calculated: 69 mg/dL (ref 0–99)
LDL/HDL Ratio: 1.1 ratio (ref 0.0–3.2)
LDL/HDL Ratio: 1.1 ratio (ref 0.0–3.2)
Triglyceride: 80 mg/dL (ref 0–149)
Triglycerides: 80 mg/dL (ref 0–149)
VLDL Cholesterol Calculated: 16 mg/dL (ref 5–40)
VLDL, calculated: 16 mg/dL (ref 5–40)

## 2019-01-04 LAB — CBC WITH AUTO DIFFERENTIAL
Basophils %: 2 %
Basophils Absolute: 0.1 10*3/uL (ref 0.0–0.2)
Eosinophils %: 3 %
Eosinophils Absolute: 0.1 10*3/uL (ref 0.0–0.4)
Granulocyte Absolute Count: 0 10*3/uL (ref 0.0–0.1)
Hematocrit: 41.4 % (ref 34.0–46.6)
Hemoglobin: 13.9 g/dL (ref 11.1–15.9)
Immature Granulocytes: 0 %
Lymphocytes %: 45 %
Lymphocytes Absolute: 1.5 10*3/uL (ref 0.7–3.1)
MCH: 31.3 pg (ref 26.6–33.0)
MCHC: 33.6 g/dL (ref 31.5–35.7)
MCV: 93 fL (ref 79–97)
Monocytes %: 14 %
Monocytes Absolute: 0.5 10*3/uL (ref 0.1–0.9)
Neutrophils %: 36 %
Neutrophils Absolute: 1.2 10*3/uL — ABNORMAL LOW (ref 1.4–7.0)
Platelets: 309 10*3/uL (ref 150–450)
RBC: 4.44 x10E6/uL (ref 3.77–5.28)
RDW: 12.8 % (ref 11.7–15.4)
WBC: 3.4 10*3/uL (ref 3.4–10.8)

## 2019-01-04 LAB — COMPREHENSIVE METABOLIC PANEL
ALT: 11 IU/L (ref 0–32)
AST: 10 IU/L (ref 0–40)
Albumin/Globulin Ratio: 1.9 NA (ref 1.2–2.2)
Albumin: 4.4 g/dL (ref 3.7–4.7)
Alkaline Phosphatase: 42 IU/L (ref 39–117)
BUN: 11 mg/dL (ref 8–27)
Bun/Cre Ratio: 12 NA (ref 12–28)
CO2: 24 mmol/L (ref 20–29)
Calcium: 9.6 mg/dL (ref 8.7–10.3)
Chloride: 103 mmol/L (ref 96–106)
Creatinine: 0.93 mg/dL (ref 0.57–1.00)
EGFR IF NonAfrican American: 62 mL/min/{1.73_m2} (ref >59)
GFR African American: 71 mL/min/{1.73_m2} (ref >59)
Globulin, Total: 2.3 g/dL (ref 1.5–4.5)
Glucose: 96 mg/dL (ref 65–99)
Potassium: 4.7 mmol/L (ref 3.5–5.2)
Sodium: 142 mmol/L (ref 134–144)
Total Bilirubin: 0.5 mg/dL (ref 0.0–1.2)
Total Protein: 6.7 g/dL (ref 6.0–8.5)

## 2019-01-04 LAB — THYROID CASCADE PROFILE
TSH: 5.24 u[IU]/mL — ABNORMAL HIGH (ref 0.450–4.500)
TSH: 5.24 u[IU]/mL — ABNORMAL HIGH (ref 0.450–4.500)

## 2019-01-04 LAB — CBC WITH AUTOMATED DIFF
ABS. BASOPHILS: 0.1 10*3/uL (ref 0.0–0.2)
ABS. EOSINOPHILS: 0.1 10*3/uL (ref 0.0–0.4)
ABS. IMM. GRANS.: 0 10*3/uL (ref 0.0–0.1)
ABS. MONOCYTES: 0.5 10*3/uL (ref 0.1–0.9)
ABS. NEUTROPHILS: 1.2 10*3/uL — ABNORMAL LOW (ref 1.4–7.0)
Abs Lymphocytes: 1.5 10*3/uL (ref 0.7–3.1)
BASOPHILS: 2 %
EOSINOPHILS: 3 %
HCT: 41.4 % (ref 34.0–46.6)
HGB: 13.9 g/dL (ref 11.1–15.9)
IMMATURE GRANULOCYTES: 0 %
Lymphocytes: 45 %
MCH: 31.3 pg (ref 26.6–33.0)
MCHC: 33.6 g/dL (ref 31.5–35.7)
MCV: 93 fL (ref 79–97)
MONOCYTES: 14 %
NEUTROPHILS: 36 %
PLATELET: 309 10*3/uL (ref 150–450)
RBC: 4.44 x10E6/uL (ref 3.77–5.28)
RDW: 12.8 % (ref 11.7–15.4)
WBC: 3.4 10*3/uL (ref 3.4–10.8)

## 2019-01-04 LAB — METABOLIC PANEL, COMPREHENSIVE
A-G Ratio: 1.9 (ref 1.2–2.2)
ALT (SGPT): 11 IU/L (ref 0–32)
AST (SGOT): 10 IU/L (ref 0–40)
Albumin: 4.4 g/dL (ref 3.7–4.7)
Alk. phosphatase: 42 IU/L (ref 39–117)
BUN/Creatinine ratio: 12 (ref 12–28)
BUN: 11 mg/dL (ref 8–27)
Bilirubin, total: 0.5 mg/dL (ref 0.0–1.2)
CO2: 24 mmol/L (ref 20–29)
Calcium: 9.6 mg/dL (ref 8.7–10.3)
Chloride: 103 mmol/L (ref 96–106)
Creatinine: 0.93 mg/dL (ref 0.57–1.00)
GFR est AA: 71 mL/min/{1.73_m2} (ref >59)
GFR est non-AA: 62 mL/min/{1.73_m2} (ref >59)
GLOBULIN, TOTAL: 2.3 g/dL (ref 1.5–4.5)
Glucose: 96 mg/dL (ref 65–99)
Potassium: 4.7 mmol/L (ref 3.5–5.2)
Protein, total: 6.7 g/dL (ref 6.0–8.5)
Sodium: 142 mmol/L (ref 134–144)

## 2019-01-04 LAB — THYROID PEROXIDASE (TPO) AB: Thyroid peroxidase (TPO) Ab: 11 IU/mL (ref 0–34)

## 2019-01-04 LAB — THYROXINE (T4) FREE, DIRECT, S: T4,Free,(Direct): 1.11 ng/dL (ref 0.82–1.77)

## 2019-01-09 ENCOUNTER — Ambulatory Visit
Admit: 2019-01-09 | Discharge: 2019-01-09 | Payer: BLUE CROSS/BLUE SHIELD | Attending: Family Medicine | Primary: Family Medicine

## 2019-01-09 ENCOUNTER — Ambulatory Visit: Attending: Family Medicine | Primary: Family Medicine

## 2019-01-09 DIAGNOSIS — M810 Age-related osteoporosis without current pathological fracture: Secondary | ICD-10-CM

## 2019-01-09 MED ORDER — RIVAROXABAN 20 MG TAB
20 mg | ORAL_TABLET | ORAL | 5 refills | Status: DC
Start: 2019-01-09 — End: 2019-07-11

## 2019-01-09 MED ORDER — VARICELLA-ZOSTER GLYCOE VACC-AS01B ADJ(PF) 50 MCG/0.5 ML IM SUSPENSION
50 mcg/0.5 mL | Freq: Once | INTRAMUSCULAR | 0 refills | Status: AC
Start: 2019-01-09 — End: 2019-01-09

## 2019-01-09 MED ORDER — ALBUTEROL SULFATE HFA 90 MCG/ACTUATION AEROSOL INHALER
90 mcg/actuation | RESPIRATORY_TRACT | 5 refills | Status: DC | PRN
Start: 2019-01-09 — End: 2019-07-02

## 2019-01-09 MED ORDER — ALENDRONATE 70 MG TAB
70 mg | ORAL_TABLET | ORAL | 5 refills | Status: DC
Start: 2019-01-09 — End: 2019-07-11

## 2019-01-09 NOTE — Progress Notes (Signed)
[facility]  Raydel Hosick L. Oletta Darter, M.D.  [address] [name] [address]  Ph No:  [phone]  Fax:  [phone]    CHIEF COMPLAINT:  Chief Complaint   Patient presents with   ??? Asthma     Patient is here today to follow up to Asthma, she is requesting refills.   ??? Osteoporosis     Patient is here today to get refills of her medication.   ??? Blood Clot     Patient is requesting refills of her medication for DVT in her leg.   ??? Labs     Patient is here to go over lab results.          HISTORY OF PRESENT ILLNESS:  Patient is here today for follow-up.  She has history of asthma.  She has albuterol that she only uses as needed.  She has mild intermittent asthma and has no more than 2 episodes per month.  She has not had to go to the hospital.  She has not had any recent bouts of pneumonia or other infection.  She denies any adverse reactions from medication.  No palpitations or chest pain.  She also has history of osteoporosis.  She is currently on Fosamax weekly which she is tolerating well.  She is not certain if medications have made any impact on her bone density.  She denies any adverse reactions to the medications.  She has not had any falls or fractures.  She has a history of chronic DVT.  She is currently on Xarelto daily.  She was told she would need to remain on this and she has not had any bruising or bleeding.  She has tolerated medication well.  She is here otherwise to follow-up on her labs.    HISTORY:  Allergies   Allergen Reactions   ??? Codeine Unable to Obtain     Past Medical History:   Diagnosis Date   ??? Allergic rhinitis, cause unspecified 11/27/2012   ??? Contact dermatitis and other eczema, due to unspecified cause 11/27/2012   ??? Encounter for long-term (current) use of other medications 11/27/2012   ??? Long term (current) use of anticoagulants 11/27/2012   ??? Nonspecific abnormal electrocardiogram (ECG) (EKG) 11/27/2012   ??? Osteoporosis    ??? 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 ABDOMEN SURGERY PROC UNLISTED     ??? HX CHOLECYSTECTOMY     ??? HX GYN      Removed tubes   ??? VASCULAR SURGERY PROCEDURE UNLIST      Left leg striped the vein     Family History   Problem Relation Age of Onset   ??? Diabetes Mother    ??? Cancer Father    ??? Asthma Father    ??? Heart Disease Father    ??? Diabetes Sister    ??? Cancer Brother      Social History     Socioeconomic History   ??? Marital status: MARRIED     Spouse name: Not on file   ??? Number of children: Not on file   ??? Years of education: Not on file   ??? Highest education level: Not on file   Occupational History   ??? Not on file   Social Needs   ??? Financial resource strain: Not on file   ??? Food insecurity:     Worry: Not on file  Inability: Not on file   ??? Transportation needs:     Medical: Not on file     Non-medical: Not on file   Tobacco Use   ??? Smoking status: Never Smoker   ??? Smokeless tobacco: Never Used   Substance and Sexual Activity   ??? Alcohol use: No   ??? Drug use: No   ??? Sexual activity: Not on file   Lifestyle   ??? Physical activity:     Days per week: Not on file     Minutes per session: Not on file   ??? Stress: Not on file   Relationships   ??? Social connections:     Talks on phone: Not on file     Gets together: Not on file     Attends religious service: Not on file     Active member of club or organization: Not on file     Attends meetings of clubs or organizations: Not on file     Relationship status: Not on file   ??? Intimate partner violence:     Fear of current or ex partner: Not on file     Emotionally abused: Not on file     Physically abused: Not on file     Forced sexual activity: Not on file   Other Topics Concern   ??? Not on file   Social History Narrative   ??? Not on file     Current Outpatient Medications   Medication Sig Dispense Refill   ??? alendronate (FOSAMAX) 70 mg tablet Take 1 Tab by mouth every seven (7) days. 4 Tab 5   ??? albuterol (PROVENTIL HFA, VENTOLIN  HFA, PROAIR HFA) 90 mcg/actuation inhaler Take 2 Puffs by inhalation every four (4) hours as needed for Wheezing (FOR SOB). 1 Inhaler 5   ??? rivaroxaban (XARELTO) 20 mg tab tablet TAKE 1 TABLET BY MOUTH DAILY WITH DINNER 30 Tab 5   ??? varicella-zoster recombinant, PF, (SHINGRIX, PF,) 50 mcg/0.5 mL susr injection 0.5 mL by IntraMUSCular route once for 1 dose. 0.5 mL 0   ??? atorvastatin (LIPITOR) 40 mg tablet Take 1 Tab by mouth daily. 90 Tab 1   ??? montelukast (SINGULAIR) 10 mg tablet TAKE 1 TABLET BY MOUTH EVERY DAY 90 Tab 1   ??? nitroglycerin (NITROSTAT) 0.4 mg SL tablet by SubLINGual route every five (5) minutes as needed for Chest Pain.     ??? calcium-vitamin D (OYSTER SHELL CALCIUM-VIT D3) 250-125 mg-unit tablet Take 1 Tab by mouth daily.         3 most recent PHQ Screens 01/09/2019   Little interest or pleasure in doing things Not at all   Feeling down, depressed, irritable, or hopeless Not at all   Total Score PHQ 2 0         Fall Risk Assessment, last 12 mths 01/09/2019   Able to walk? Yes   Fall in past 12 months? No   Fall with injury? -   Number of falls in past 12 months -   Fall Risk Score -                  REVIEW OF SYSTEMS:  ROS  Review of systems is as stated above, otherwise is negative.    PHYSICAL EXAM:  Vital Signs -   Visit Vitals  BP 130/68 (BP 1 Location: Left arm, BP Patient Position: Sitting)   Pulse 83   Temp 98.2 ??F (36.8 ??C) (Oral)   Ht 5'  1.5" (1.562 m)   Wt 167 lb (75.8 kg)   SpO2 97%   BMI 31.04 kg/m??      Physical Exam  Constitutional:       Appearance: She is well-developed.   HENT:      Head: Normocephalic and atraumatic.   Eyes:      Pupils: Pupils are equal, round, and reactive to light.   Neck:      Musculoskeletal: Normal range of motion and neck supple.   Cardiovascular:      Rate and Rhythm: Normal rate and regular rhythm.      Heart sounds: Normal heart sounds.   Pulmonary:      Effort: Pulmonary effort is normal.      Breath sounds: Normal breath sounds.   Musculoskeletal: Normal  range of motion.   Neurological:      Mental Status: She is alert and oriented to person, place, and time.   Psychiatric:         Behavior: Behavior normal.         Thought Content: Thought content normal.         Judgment: Judgment normal.              LABS  Results for orders placed or performed in visit on 01/02/19   CBC WITH AUTOMATED DIFF   Result Value Ref Range    WBC 3.4 3.4 - 10.8 x10E3/uL    RBC 4.44 3.77 - 5.28 x10E6/uL    HGB 13.9 11.1 - 15.9 g/dL    HCT 41.4 34.0 - 46.6 %    MCV 93 79 - 97 fL    MCH 31.3 26.6 - 33.0 pg    MCHC 33.6 31.5 - 35.7 g/dL    RDW 12.8 11.7 - 15.4 %    PLATELET 309 150 - 450 x10E3/uL    NEUTROPHILS 36 Not Estab. %    Lymphocytes 45 Not Estab. %    MONOCYTES 14 Not Estab. %    EOSINOPHILS 3 Not Estab. %    BASOPHILS 2 Not Estab. %    ABS. NEUTROPHILS 1.2 (L) 1.4 - 7.0 x10E3/uL    Abs Lymphocytes 1.5 0.7 - 3.1 x10E3/uL    ABS. MONOCYTES 0.5 0.1 - 0.9 x10E3/uL    ABS. EOSINOPHILS 0.1 0.0 - 0.4 x10E3/uL    ABS. BASOPHILS 0.1 0.0 - 0.2 x10E3/uL    IMMATURE GRANULOCYTES 0 Not Estab. %    ABS. IMM. GRANS. 0.0 0.0 - 0.1 [id number]/IR   METABOLIC PANEL, COMPREHENSIVE   Result Value Ref Range    Glucose 96 65 - 99 mg/dL    BUN 11 8 - 27 mg/dL    Creatinine 0.93 0.57 - 1.00 mg/dL    GFR est non-AA 62 >59 mL/min/1.73    GFR est AA 71 >59 mL/min/1.73    BUN/Creatinine ratio 12 12 - 28    Sodium 142 134 - 144 mmol/L    Potassium 4.7 3.5 - 5.2 mmol/L    Chloride 103 96 - 106 mmol/L    CO2 24 20 - 29 mmol/L    Calcium 9.6 8.7 - 10.3 mg/dL    Protein, total 6.7 6.0 - 8.5 g/dL    Albumin 4.4 3.7 - 4.7 g/dL    GLOBULIN, TOTAL 2.3 1.5 - 4.5 g/dL    A-G Ratio 1.9 1.2 - 2.2    Bilirubin, total 0.5 0.0 - 1.2 mg/dL    Alk. phosphatase 42 39 - 117 IU/L    AST (SGOT)  10 0 - 40 IU/L    ALT (SGPT) 11 0 - 32 IU/L   LIPID PANEL WITH LDL/HDL RATIO   Result Value Ref Range    Cholesterol, total 147 100 - 199 mg/dL    Triglyceride 80 0 - 149 mg/dL    HDL Cholesterol 62 >39 mg/dL    VLDL, calculated 16 5 - 40 mg/dL     LDL, calculated 69 0 - 99 mg/dL    LDL/HDL Ratio 1.1 0.0 - 3.2 ratio   THYROID CASCADE PROFILE   Result Value Ref Range    TSH 5.240 (H) 0.450 - 4.500 uIU/mL   THYROXINE (T4) FREE, DIRECT, S   Result Value Ref Range    T4,Free,(Direct) 1.11 0.82 - 1.77 ng/dL   THYROID PEROXIDASE (TPO) AB   Result Value Ref Range    Thyroid peroxidase (TPO) Ab 11 0 - 34 IU/mL    Interpretive Comment Comment            IMPRESSION/PLAN    Diagnoses and all orders for this visit:    1. Age-related osteoporosis without current pathological fracture  -     alendronate (FOSAMAX) 70 mg tablet; Take 1 Tab by mouth every seven (7) days.    2. 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 Mild intermittent asthma without complication  -     albuterol (PROVENTIL HFA, VENTOLIN HFA, PROAIR HFA) 90 mcg/actuation inhaler; Take 2 Puffs by inhalation every four (4) hours as needed for Wheezing (FOR SOB).    5. Elevated TSH    6. Immunization due  -     varicella-zoster recombinant, PF, (SHINGRIX, PF,) 50 mcg/0.5 mL susr injection; 0.5 mL by IntraMUSCular route once for 1 dose.        Osteoporosis is stable.  She will continue Fosamax weekly.  She will follow-up bone density test next year.    We refilled a prescription for Xarelto 20 mg.  Continue once daily dosing.  Call if she notices any bleeding or bruising.    We reviewed her asthma action plan.  Should continue to use albuterol 2 puffs 4 times daily as needed with acute onset of wheezing shortness of breath.  She is noted to have elevated TSH.  We decided not to start medication at this point.  She has been asymptomatic.  She will call if this changes and we will recheck a level in 3 months.    She is given a prescription for shingles vaccine.      Craig Staggers, MD            Dictated using voice recognition software. Proofread, but unrecognized voice recognition  errors may exist.

## 2019-01-09 NOTE — Progress Notes (Signed)
[facility]  Alaria Oconnor L. Oletta Darter, M.D.  [address]  [name] [address]  Ph No:  [phone]  Fax:  [phone]    CHIEF COMPLAINT:  Chief Complaint   Patient presents with   ??? Asthma     Patient is here today to follow up to Asthma, she is requesting refills.   ??? Osteoporosis     Patient is here today to get refills of her medication.   ??? Blood Clot     Patient is requesting refills of her medication for DVT in her leg.   ??? Labs     Patient is here to go over lab results.          HISTORY OF PRESENT ILLNESS:  Patient is here today for follow-up.  She has history of asthma.  She has albuterol that she only uses as needed.  She has mild intermittent asthma and has no more than 2 episodes per month.  She has not had to go to the hospital.  She has not had any recent bouts of pneumonia or other infection.  She denies any adverse reactions from medication.  No palpitations or chest pain.  She also has history of osteoporosis.  She is currently on Fosamax weekly which she is tolerating well.  She is not certain if medications have made any impact on her bone density.  She denies any adverse reactions to the medications.  She has not had any falls or fractures.  She has a history of chronic DVT.  She is currently on Xarelto daily.  She was told she would need to remain on this and she has not had any bruising or bleeding.  She has tolerated medication well.  She is here otherwise to follow-up on her labs.    HISTORY:  Allergies   Allergen Reactions   ??? Codeine Unable to Obtain     Past Medical History:   Diagnosis Date   ??? Allergic rhinitis, cause unspecified 11/27/2012   ??? Contact dermatitis and other eczema, due to unspecified cause 11/27/2012   ??? Encounter for long-term (current) use of other medications 11/27/2012   ??? Long term (current) use of anticoagulants 11/27/2012   ??? Nonspecific abnormal electrocardiogram (ECG) (EKG) 11/27/2012   ??? Osteoporosis     ??? 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 ABDOMEN SURGERY PROC UNLISTED     ??? HX CHOLECYSTECTOMY     ??? HX GYN      Removed tubes   ??? VASCULAR SURGERY PROCEDURE UNLIST      Left leg striped the vein     Family History   Problem Relation Age of Onset   ??? Diabetes Mother    ??? Cancer Father    ??? Asthma Father    ??? Heart Disease Father    ??? Diabetes Sister    ??? Cancer Brother      Social History     Socioeconomic History   ??? Marital status: MARRIED     Spouse name: Not on file   ??? Number of children: Not on file   ??? Years of education: Not on file   ??? Highest education level: Not on file   Occupational History   ??? Not on file   Social Needs   ??? Financial resource strain: Not on file   ??? Food insecurity:     Worry: Not on file  Inability: Not on file   ??? Transportation needs:     Medical: Not on file     Non-medical: Not on file   Tobacco Use   ??? Smoking status: Never Smoker   ??? Smokeless tobacco: Never Used   Substance and Sexual Activity   ??? Alcohol use: No   ??? Drug use: No   ??? Sexual activity: Not on file   Lifestyle   ??? Physical activity:     Days per week: Not on file     Minutes per session: Not on file   ??? Stress: Not on file   Relationships   ??? Social connections:     Talks on phone: Not on file     Gets together: Not on file     Attends religious service: Not on file     Active member of club or organization: Not on file     Attends meetings of clubs or organizations: Not on file     Relationship status: Not on file   ??? Intimate partner violence:     Fear of current or ex partner: Not on file     Emotionally abused: Not on file     Physically abused: Not on file     Forced sexual activity: Not on file   Other Topics Concern   ??? Not on file   Social History Narrative   ??? Not on file     Current Outpatient Medications   Medication Sig Dispense Refill   ??? alendronate (FOSAMAX) 70 mg tablet Take 1 Tab by mouth every seven (7) days. 4 Tab 5    ??? albuterol (PROVENTIL HFA, VENTOLIN HFA, PROAIR HFA) 90 mcg/actuation inhaler Take 2 Puffs by inhalation every four (4) hours as needed for Wheezing (FOR SOB). 1 Inhaler 5   ??? rivaroxaban (XARELTO) 20 mg tab tablet TAKE 1 TABLET BY MOUTH DAILY WITH DINNER 30 Tab 5   ??? varicella-zoster recombinant, PF, (SHINGRIX, PF,) 50 mcg/0.5 mL susr injection 0.5 mL by IntraMUSCular route once for 1 dose. 0.5 mL 0   ??? atorvastatin (LIPITOR) 40 mg tablet Take 1 Tab by mouth daily. 90 Tab 1   ??? montelukast (SINGULAIR) 10 mg tablet TAKE 1 TABLET BY MOUTH EVERY DAY 90 Tab 1   ??? nitroglycerin (NITROSTAT) 0.4 mg SL tablet by SubLINGual route every five (5) minutes as needed for Chest Pain.     ??? calcium-vitamin D (OYSTER SHELL CALCIUM-VIT D3) 250-125 mg-unit tablet Take 1 Tab by mouth daily.         3 most recent PHQ Screens 01/09/2019   Little interest or pleasure in doing things Not at all   Feeling down, depressed, irritable, or hopeless Not at all   Total Score PHQ 2 0         Fall Risk Assessment, last 12 mths 01/09/2019   Able to walk? Yes   Fall in past 12 months? No   Fall with injury? -   Number of falls in past 12 months -   Fall Risk Score -                  REVIEW OF SYSTEMS:  ROS  Review of systems is as stated above, otherwise is negative.    PHYSICAL EXAM:  Vital Signs -   Visit Vitals  BP 130/68 (BP 1 Location: Left arm, BP Patient Position: Sitting)   Pulse 83   Temp 98.2 ??F (36.8 ??C) (Oral)   Ht 5'  1.5" (1.562 m)   Wt 167 lb (75.8 kg)   SpO2 97%   BMI 31.04 kg/m??      Physical Exam  Constitutional:       Appearance: She is well-developed.   HENT:      Head: Normocephalic and atraumatic.   Eyes:      Pupils: Pupils are equal, round, and reactive to light.   Neck:      Musculoskeletal: Normal range of motion and neck supple.   Cardiovascular:      Rate and Rhythm: Normal rate and regular rhythm.      Heart sounds: Normal heart sounds.   Pulmonary:      Effort: Pulmonary effort is normal.       Breath sounds: Normal breath sounds.   Musculoskeletal: Normal range of motion.   Neurological:      Mental Status: She is alert and oriented to person, place, and time.   Psychiatric:         Behavior: Behavior normal.         Thought Content: Thought content normal.         Judgment: Judgment normal.              LABS  Results for orders placed or performed in visit on 01/02/19   CBC WITH AUTOMATED DIFF   Result Value Ref Range    WBC 3.4 3.4 - 10.8 x10E3/uL    RBC 4.44 3.77 - 5.28 x10E6/uL    HGB 13.9 11.1 - 15.9 g/dL    HCT 41.4 34.0 - 46.6 %    MCV 93 79 - 97 fL    MCH 31.3 26.6 - 33.0 pg    MCHC 33.6 31.5 - 35.7 g/dL    RDW 12.8 11.7 - 15.4 %    PLATELET 309 150 - 450 x10E3/uL    NEUTROPHILS 36 Not Estab. %    Lymphocytes 45 Not Estab. %    MONOCYTES 14 Not Estab. %    EOSINOPHILS 3 Not Estab. %    BASOPHILS 2 Not Estab. %    ABS. NEUTROPHILS 1.2 (L) 1.4 - 7.0 x10E3/uL    Abs Lymphocytes 1.5 0.7 - 3.1 x10E3/uL    ABS. MONOCYTES 0.5 0.1 - 0.9 x10E3/uL    ABS. EOSINOPHILS 0.1 0.0 - 0.4 x10E3/uL    ABS. BASOPHILS 0.1 0.0 - 0.2 x10E3/uL    IMMATURE GRANULOCYTES 0 Not Estab. %    ABS. IMM. GRANS. 0.0 0.0 - 0.1 [id number]/RC   METABOLIC PANEL, COMPREHENSIVE   Result Value Ref Range    Glucose 96 65 - 99 mg/dL    BUN 11 8 - 27 mg/dL    Creatinine 0.93 0.57 - 1.00 mg/dL    GFR est non-AA 62 >59 mL/min/1.73    GFR est AA 71 >59 mL/min/1.73    BUN/Creatinine ratio 12 12 - 28    Sodium 142 134 - 144 mmol/L    Potassium 4.7 3.5 - 5.2 mmol/L    Chloride 103 96 - 106 mmol/L    CO2 24 20 - 29 mmol/L    Calcium 9.6 8.7 - 10.3 mg/dL    Protein, total 6.7 6.0 - 8.5 g/dL    Albumin 4.4 3.7 - 4.7 g/dL    GLOBULIN, TOTAL 2.3 1.5 - 4.5 g/dL    A-G Ratio 1.9 1.2 - 2.2    Bilirubin, total 0.5 0.0 - 1.2 mg/dL    Alk. phosphatase 42 39 - 117 IU/L    AST (SGOT)  10 0 - 40 IU/L    ALT (SGPT) 11 0 - 32 IU/L   LIPID PANEL WITH LDL/HDL RATIO   Result Value Ref Range    Cholesterol, total 147 100 - 199 mg/dL    Triglyceride 80 0 - 149 mg/dL     HDL Cholesterol 62 >39 mg/dL    VLDL, calculated 16 5 - 40 mg/dL    LDL, calculated 69 0 - 99 mg/dL    LDL/HDL Ratio 1.1 0.0 - 3.2 ratio   THYROID CASCADE PROFILE   Result Value Ref Range    TSH 5.240 (H) 0.450 - 4.500 uIU/mL   THYROXINE (T4) FREE, DIRECT, S   Result Value Ref Range    T4,Free,(Direct) 1.11 0.82 - 1.77 ng/dL   THYROID PEROXIDASE (TPO) AB   Result Value Ref Range    Thyroid peroxidase (TPO) Ab 11 0 - 34 IU/mL    Interpretive Comment Comment            IMPRESSION/PLAN    Diagnoses and all orders for this visit:    1. Age-related osteoporosis without current pathological fracture  -     alendronate (FOSAMAX) 70 mg tablet; Take 1 Tab by mouth every seven (7) days.    2. 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 Mild intermittent asthma without complication  -     albuterol (PROVENTIL HFA, VENTOLIN HFA, PROAIR HFA) 90 mcg/actuation inhaler; Take 2 Puffs by inhalation every four (4) hours as needed for Wheezing (FOR SOB).    5. Elevated TSH    6. Immunization due  -     varicella-zoster recombinant, PF, (SHINGRIX, PF,) 50 mcg/0.5 mL susr injection; 0.5 mL by IntraMUSCular route once for 1 dose.        Osteoporosis is stable.  She will continue Fosamax weekly.  She will follow-up bone density test next year.    We refilled a prescription for Xarelto 20 mg.  Continue once daily dosing.  Call if she notices any bleeding or bruising.    We reviewed her asthma action plan.  Should continue to use albuterol 2 puffs 4 times daily as needed with acute onset of wheezing shortness of breath.  She is noted to have elevated TSH.  We decided not to start medication at this point.  She has been asymptomatic.  She will call if this changes and we will recheck a level in 3 months.    She is given a prescription for shingles vaccine.      Craig Staggers, MD             Dictated using voice recognition software. Proofread, but unrecognized voice recognition errors may exist.

## 2019-01-10 ENCOUNTER — Encounter: Attending: Family Medicine | Primary: Family Medicine

## 2019-04-09 ENCOUNTER — Encounter: Primary: Family Medicine

## 2019-06-21 ENCOUNTER — Encounter

## 2019-06-24 ENCOUNTER — Encounter

## 2019-06-25 MED ORDER — ATORVASTATIN 40 MG TAB
40 mg | ORAL_TABLET | ORAL | 1 refills | Status: DC
Start: 2019-06-25 — End: 2019-07-02

## 2019-06-25 MED ORDER — MONTELUKAST 10 MG TAB
10 mg | ORAL_TABLET | ORAL | 1 refills | Status: DC
Start: 2019-06-25 — End: 2019-07-02

## 2019-07-02 ENCOUNTER — Telehealth

## 2019-07-02 MED ORDER — MONTELUKAST 10 MG TAB
10 mg | ORAL_TABLET | Freq: Every day | ORAL | 1 refills | Status: DC
Start: 2019-07-02 — End: 2019-12-20

## 2019-07-02 MED ORDER — ALBUTEROL SULFATE HFA 90 MCG/ACTUATION AEROSOL INHALER
90 mcg/actuation | RESPIRATORY_TRACT | 5 refills | Status: DC | PRN
Start: 2019-07-02 — End: 2020-01-13

## 2019-07-02 MED ORDER — ATORVASTATIN 40 MG TAB
40 mg | ORAL_TABLET | ORAL | 1 refills | Status: DC
Start: 2019-07-02 — End: 2019-12-20

## 2019-07-02 NOTE — Telephone Encounter (Signed)
Patient has an appointment on the 13th but is out of her Singulair, Lipitor and /albuterol. Prescriptions sent into pharmacy.

## 2019-07-03 ENCOUNTER — Encounter: Admit: 2019-07-03 | Discharge: 2019-07-03 | Payer: MEDICARE | Primary: Family Medicine

## 2019-07-03 DIAGNOSIS — E782 Mixed hyperlipidemia: Secondary | ICD-10-CM

## 2019-07-04 LAB — COMPREHENSIVE METABOLIC PANEL
ALT: 10 IU/L (ref 0–32)
AST: 12 IU/L (ref 0–40)
Albumin/Globulin Ratio: 1.8 NA (ref 1.2–2.2)
Albumin: 4.1 g/dL (ref 3.7–4.7)
Alkaline Phosphatase: 41 IU/L (ref 39–117)
BUN: 14 mg/dL (ref 8–27)
Bun/Cre Ratio: 18 NA (ref 12–28)
CO2: 24 mmol/L (ref 20–29)
Calcium: 9.6 mg/dL (ref 8.7–10.3)
Chloride: 104 mmol/L (ref 96–106)
Creatinine: 0.79 mg/dL (ref 0.57–1.00)
EGFR IF NonAfrican American: 74 mL/min/{1.73_m2} (ref >59)
GFR African American: 86 mL/min/{1.73_m2} (ref >59)
Globulin, Total: 2.3 g/dL (ref 1.5–4.5)
Glucose: 101 mg/dL — ABNORMAL HIGH (ref 65–99)
Potassium: 4.6 mmol/L (ref 3.5–5.2)
Sodium: 142 mmol/L (ref 134–144)
Total Bilirubin: 0.5 mg/dL (ref 0.0–1.2)
Total Protein: 6.4 g/dL (ref 6.0–8.5)

## 2019-07-04 LAB — CBC WITH AUTO DIFFERENTIAL
Basophils %: 2 %
Basophils Absolute: 0.1 10*3/uL (ref 0.0–0.2)
Eosinophils %: 2 %
Eosinophils Absolute: 0.1 10*3/uL (ref 0.0–0.4)
Granulocyte Absolute Count: 0 10*3/uL (ref 0.0–0.1)
Hematocrit: 39.4 % (ref 34.0–46.6)
Hemoglobin: 13.1 g/dL (ref 11.1–15.9)
Immature Granulocytes: 0 %
Lymphocytes %: 47 %
Lymphocytes Absolute: 1.5 10*3/uL (ref 0.7–3.1)
MCH: 31.5 pg (ref 26.6–33.0)
MCHC: 33.2 g/dL (ref 31.5–35.7)
MCV: 95 fL (ref 79–97)
Monocytes %: 15 %
Monocytes Absolute: 0.5 10*3/uL (ref 0.1–0.9)
Neutrophils %: 34 %
Neutrophils Absolute: 1.1 10*3/uL — ABNORMAL LOW (ref 1.4–7.0)
Platelets: 288 10*3/uL (ref 150–450)
RBC: 4.16 x10E6/uL (ref 3.77–5.28)
RDW: 12.4 % (ref 11.7–15.4)
WBC: 3.3 10*3/uL — ABNORMAL LOW (ref 3.4–10.8)

## 2019-07-04 LAB — LIPID PANEL WITH LDL/HDL RATIO
Cholesterol, Total: 130 mg/dL (ref 100–199)
Cholesterol, total: 130 mg/dL (ref 100–199)
HDL Cholesterol: 62 mg/dL (ref >39)
HDL: 62 mg/dL (ref >39)
LDL Calculated: 56 mg/dL (ref 0–99)
LDL, calculated: 56 mg/dL (ref 0–99)
LDL/HDL Ratio: 0.9 ratio (ref 0.0–3.2)
LDL/HDL Ratio: 0.9 ratio (ref 0.0–3.2)
Triglyceride: 59 mg/dL (ref 0–149)
Triglycerides: 59 mg/dL (ref 0–149)
VLDL Cholesterol Calculated: 12 mg/dL (ref 5–40)
VLDL, calculated: 12 mg/dL (ref 5–40)

## 2019-07-04 LAB — HEMOGLOBIN A1C W/EAG
Hemoglobin A1C: 5.6 % (ref 4.8–5.6)
eAG: 114 mg/dL

## 2019-07-04 LAB — TSH 3RD GENERATION
TSH: 3.72 u[IU]/mL (ref 0.450–4.500)
TSH: 3.72 u[IU]/mL (ref 0.450–4.500)

## 2019-07-04 LAB — METABOLIC PANEL, COMPREHENSIVE
A-G Ratio: 1.8 (ref 1.2–2.2)
ALT (SGPT): 10 IU/L (ref 0–32)
AST (SGOT): 12 IU/L (ref 0–40)
Albumin: 4.1 g/dL (ref 3.7–4.7)
Alk. phosphatase: 41 IU/L (ref 39–117)
BUN/Creatinine ratio: 18 (ref 12–28)
BUN: 14 mg/dL (ref 8–27)
Bilirubin, total: 0.5 mg/dL (ref 0.0–1.2)
CO2: 24 mmol/L (ref 20–29)
Calcium: 9.6 mg/dL (ref 8.7–10.3)
Chloride: 104 mmol/L (ref 96–106)
Creatinine: 0.79 mg/dL (ref 0.57–1.00)
GFR est AA: 86 mL/min/{1.73_m2} (ref >59)
GFR est non-AA: 74 mL/min/{1.73_m2} (ref >59)
GLOBULIN, TOTAL: 2.3 g/dL (ref 1.5–4.5)
Glucose: 101 mg/dL — ABNORMAL HIGH (ref 65–99)
Potassium: 4.6 mmol/L (ref 3.5–5.2)
Protein, total: 6.4 g/dL (ref 6.0–8.5)
Sodium: 142 mmol/L (ref 134–144)

## 2019-07-04 LAB — CBC WITH AUTOMATED DIFF
ABS. BASOPHILS: 0.1 10*3/uL (ref 0.0–0.2)
ABS. EOSINOPHILS: 0.1 10*3/uL (ref 0.0–0.4)
ABS. IMM. GRANS.: 0 10*3/uL (ref 0.0–0.1)
ABS. MONOCYTES: 0.5 10*3/uL (ref 0.1–0.9)
ABS. NEUTROPHILS: 1.1 10*3/uL — ABNORMAL LOW (ref 1.4–7.0)
Abs Lymphocytes: 1.5 10*3/uL (ref 0.7–3.1)
BASOPHILS: 2 %
EOSINOPHILS: 2 %
HCT: 39.4 % (ref 34.0–46.6)
HGB: 13.1 g/dL (ref 11.1–15.9)
IMMATURE GRANULOCYTES: 0 %
Lymphocytes: 47 %
MCH: 31.5 pg (ref 26.6–33.0)
MCHC: 33.2 g/dL (ref 31.5–35.7)
MCV: 95 fL (ref 79–97)
MONOCYTES: 15 %
NEUTROPHILS: 34 %
PLATELET: 288 10*3/uL (ref 150–450)
RBC: 4.16 x10E6/uL (ref 3.77–5.28)
RDW: 12.4 % (ref 11.7–15.4)
WBC: 3.3 10*3/uL — ABNORMAL LOW (ref 3.4–10.8)

## 2019-07-04 LAB — HEMOGLOBIN A1C WITH EAG
Estimated average glucose: 114 mg/dL
Hemoglobin A1c: 5.6 % (ref 4.8–5.6)

## 2019-07-10 ENCOUNTER — Encounter: Attending: Family Medicine | Primary: Family Medicine

## 2019-07-11 ENCOUNTER — Telehealth
Admit: 2019-07-11 | Discharge: 2019-07-11 | Payer: PRIVATE HEALTH INSURANCE | Attending: Family Medicine | Primary: Family Medicine

## 2019-07-11 ENCOUNTER — Telehealth: Attending: Family Medicine | Primary: Family Medicine

## 2019-07-11 DIAGNOSIS — E782 Mixed hyperlipidemia: Secondary | ICD-10-CM

## 2019-07-11 MED ORDER — ALENDRONATE 70 MG TAB
70 mg | ORAL_TABLET | ORAL | 5 refills | Status: DC
Start: 2019-07-11 — End: 2020-01-13

## 2019-07-11 MED ORDER — RIVAROXABAN 20 MG TAB
20 mg | ORAL_TABLET | ORAL | 5 refills | Status: DC
Start: 2019-07-11 — End: 2020-01-13

## 2019-07-11 NOTE — Progress Notes (Signed)
[facility]  Tonya Shaffer, M.D.  [name] [address]  Ph No:  [phone]  Fax:  [phone]        CHIEF COMPLAINT:  No chief complaint on file.         HISTORY OF PRESENT ILLNESS:  Patient here today for follow-up.  Has a history of hypercholesterolemia.  She is currently on atorvastatin 40 mg.  Tolerates this well.  No muscle pain no muscle cramps or joint pain.  She denies chest pain or shortness of breath.  She is compliant with medication.  She also has a history of mild intermittent asthma.  She uses albuterol MDI as needed.  Also has history of recurrent DVT.  She is currently on Xarelto and has been on this chronically.  No bleeding or other adverse reactions.  She otherwise has no other complaints.    HISTORY:  Allergies   Allergen Reactions   ??? Codeine Unable to Obtain     Past Medical History:   Diagnosis Date   ??? Allergic rhinitis, cause unspecified 11/27/2012   ??? Contact dermatitis and other eczema, due to unspecified cause 11/27/2012   ??? Encounter for long-term (current) use of other medications 11/27/2012   ??? Long term (current) use of anticoagulants 11/27/2012   ??? Nonspecific abnormal electrocardiogram (ECG) (EKG) 11/27/2012   ??? Osteoporosis    ??? 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 ABDOMEN SURGERY PROC UNLISTED     ??? HX CHOLECYSTECTOMY     ??? HX GYN      Removed tubes   ??? VASCULAR SURGERY PROCEDURE UNLIST      Left leg striped the vein     Family History   Problem Relation Age of Onset   ??? Diabetes Mother    ??? Cancer Father    ??? Asthma Father    ??? Heart Disease Father    ??? Diabetes Sister    ??? Cancer Brother      Social History     Socioeconomic History   ??? Marital status: MARRIED     Spouse name: Not on file   ??? Number of children: Not on file   ??? Years of education: Not on file   ??? Highest education level: Not on file   Occupational History   ??? Not on file   Social Needs   ???  Financial resource strain: Not on file   ??? Food insecurity     Worry: Not on file     Inability: Not on file   ??? Transportation needs     Medical: Not on file     Non-medical: Not on file   Tobacco Use   ??? Smoking status: Never Smoker   ??? Smokeless tobacco: Never Used   Substance and Sexual Activity   ??? Alcohol use: No   ??? Drug use: No   ??? Sexual activity: Not on file   Lifestyle   ??? Physical activity     Days per week: Not on file     Minutes per session: Not on file   ??? Stress: Not on file   Relationships   ??? Social Product manager on phone: Not on file     Gets together: Not on file     Attends religious service: Not on file     Active member of club or organization:  Not on file     Attends meetings of clubs or organizations: Not on file     Relationship status: Not on file   ??? Intimate partner violence     Fear of current or ex partner: Not on file     Emotionally abused: Not on file     Physically abused: Not on file     Forced sexual activity: Not on file   Other Topics Concern   ??? Not on file   Social History Narrative   ??? Not on file     Current Outpatient Medications   Medication Sig Dispense Refill   ??? rivaroxaban (Xarelto) 20 mg tab tablet TAKE 1 TABLET BY MOUTH DAILY WITH DINNER 30 Tab 5   ??? alendronate (Fosamax) 70 mg tablet Take 1 Tab by mouth every seven (7) days. 4 Tab 5   ??? montelukast (SINGULAIR) 10 mg tablet Take 1 Tab by mouth daily. 90 Tab 1   ??? albuterol (PROVENTIL HFA, VENTOLIN HFA, PROAIR HFA) 90 mcg/actuation inhaler Take 2 Puffs by inhalation every four (4) hours as needed for Wheezing (FOR SOB). 1 Inhaler 5   ??? atorvastatin (LIPITOR) 40 mg tablet TAKE 1 TABLET BY MOUTH DAILY 90 Tab 1   ??? calcium-vitamin D (OYSTER SHELL CALCIUM-VIT D3) 250-125 mg-unit tablet Take 1 Tab by mouth daily.         3 most recent PHQ Screens 01/09/2019   Little interest or pleasure in doing things Not at all   Feeling down, depressed, irritable, or hopeless Not at all   Total Score PHQ 2 0         Fall Risk  Assessment, last 12 mths 01/09/2019   Able to walk? Yes   Fall in past 12 months? No   Fall with injury? -   Number of falls in past 12 months -   Fall Risk Score -         REVIEW OF SYSTEMS:  ROS  Review of systems is as stated above, otherwise is negative.       PHYSICAL EXAMINATION:    Vital Signs: (As obtained by patient/caregiver at home)  There were no vitals taken for this visit.     Constitutional: '[x]'  Appears well-developed and well-nourished '[x]'  No apparent distress      '[]'  Abnormal -     Mental status: '[x]'  Alert and awake  '[x]'  Oriented to person/place/time '[x]'  Able to follow commands    '[]'  Abnormal -     Eyes:   EOM    '[x]'   Normal    '[]'  Abnormal -   Sclera  '[x]'   Normal    '[]'  Abnormal -          Discharge '[x]'   None visible   '[]'  Abnormal -     HENT: '[x]'  Normocephalic, atraumatic  '[]'  Abnormal -   '[x]'  Mouth/Throat: Mucous membranes are moist    External Ears '[x]'  Normal  '[]'  Abnormal -    Neck: '[x]'  No visualized mass '[]'  Abnormal -     Pulmonary/Chest: '[x]'  Respiratory effort normal   '[x]'  No visualized signs of difficulty breathing or respiratory distress        '[]'  Abnormal -      Musculoskeletal:   '[x]'  Normal gait with no signs of ataxia         '[x]'  Normal range of motion of neck        '[]'  Abnormal -     Neurological:        [  x] No Facial Asymmetry (Cranial nerve 7 motor function) (limited exam due to video visit)          '[x]'  No gaze palsy        '[]'  Abnormal -          Skin:        '[x]'  No significant exanthematous lesions or discoloration noted on facial skin         '[]'  Abnormal -            Psychiatric:       '[x]'  Normal Affect '[]'  Abnormal -        '[x]'  No Hallucinations    Other pertinent observable physical exam findings:-        LABS  Results for orders placed or performed in visit on 07/03/19   CBC WITH AUTOMATED DIFF   Result Value Ref Range    WBC 3.3 (L) 3.4 - 10.8 x10E3/uL    RBC 4.16 3.77 - 5.28 x10E6/uL    HGB 13.1 11.1 - 15.9 g/dL    HCT 39.4 34.0 - 46.6 %    MCV 95 79 - 97 fL    MCH 31.5 26.6 - 33.0  pg    MCHC 33.2 31.5 - 35.7 g/dL    RDW 12.4 11.7 - 15.4 %    PLATELET 288 150 - 450 x10E3/uL    NEUTROPHILS 34 Not Estab. %    Lymphocytes 47 Not Estab. %    MONOCYTES 15 Not Estab. %    EOSINOPHILS 2 Not Estab. %    BASOPHILS 2 Not Estab. %    ABS. NEUTROPHILS 1.1 (L) 1.4 - 7.0 x10E3/uL    Abs Lymphocytes 1.5 0.7 - 3.1 x10E3/uL    ABS. MONOCYTES 0.5 0.1 - 0.9 x10E3/uL    ABS. EOSINOPHILS 0.1 0.0 - 0.4 x10E3/uL    ABS. BASOPHILS 0.1 0.0 - 0.2 x10E3/uL    IMMATURE GRANULOCYTES 0 Not Estab. %    ABS. IMM. GRANS. 0.0 0.0 - 0.1 V70H4/KB   METABOLIC PANEL, COMPREHENSIVE   Result Value Ref Range    Glucose 101 (H) 65 - 99 mg/dL    BUN 14 8 - 27 mg/dL    Creatinine 0.79 0.57 - 1.00 mg/dL    GFR est non-AA 74 >59 mL/min/1.73    GFR est AA 86 >59 mL/min/1.73    BUN/Creatinine ratio 18 12 - 28    Sodium 142 134 - 144 mmol/L    Potassium 4.6 3.5 - 5.2 mmol/L    Chloride 104 96 - 106 mmol/L    CO2 24 20 - 29 mmol/L    Calcium 9.6 8.7 - 10.3 mg/dL    Protein, total 6.4 6.0 - 8.5 g/dL    Albumin 4.1 3.7 - 4.7 g/dL    GLOBULIN, TOTAL 2.3 1.5 - 4.5 g/dL    A-G Ratio 1.8 1.2 - 2.2    Bilirubin, total 0.5 0.0 - 1.2 mg/dL    Alk. phosphatase 41 39 - 117 IU/L    AST (SGOT) 12 0 - 40 IU/L    ALT (SGPT) 10 0 - 32 IU/L   LIPID PANEL WITH LDL/HDL RATIO   Result Value Ref Range    Cholesterol, total 130 100 - 199 mg/dL    Triglyceride 59 0 - 149 mg/dL    HDL Cholesterol 62 >39 mg/dL    VLDL, calculated 12 5 - 40 mg/dL    LDL, calculated 56 0 - 99 mg/dL    LDL/HDL Ratio 0.9  0.0 - 3.2 ratio   TSH 3RD GENERATION   Result Value Ref Range    TSH 3.720 0.450 - 4.500 uIU/mL   HEMOGLOBIN A1C WITH EAG   Result Value Ref Range    Hemoglobin A1c 5.6 4.8 - 5.6 %    Estimated average glucose 114 mg/dL           IMPRESSION/PLAN    Diagnoses and all orders for this visit:    1. Mixed hyperlipidemia    2. History of DVT (deep vein thrombosis)  -     rivaroxaban (Xarelto) 20 mg tab tablet; TAKE 1 TABLET BY MOUTH DAILY WITH DINNER    3. Age-related  osteoporosis without current pathological fracture  -     alendronate (Fosamax) 70 mg tablet; Take 1 Tab by mouth every seven (7) days.    4. Mild intermittent asthma without complication        She will continue with atorvastatin 40 mg once daily for hypercholesterolemia.  Cholesterol is currently well controlled.  She has not needed to take nitroglycerin.  Prescription was last written in 2014.  Should call if she develops any chest pain.  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  Call if she notices any bruising or bleeding.  Continue with albuterol MDI 2 puffs 4 times daily as needed for wheezing shortness of breath.  Her A1c is normal at 5.6.  We will continue to monitor and recheck in 6 months.    We discussed the expected course, resolution and complications of the diagnosis(es) in detail.  Medication risks, benefits, costs, interactions, and alternatives were discussed as indicated.  I advised her to contact the office if her condition worsens, changes or fails to improve as anticipated. She expressed understanding with the diagnosis(es) and plan.        I was in the office while conducting this encounter.    Consent:  She and/or her healthcare decision maker is aware that this patient-initiated Telehealth encounter is a billable service, with coverage as determined by her insurance carrier. She is aware that she may receive a bill and has provided verbal consent to proceed: Yes    This virtual visit was conducted via [email or web address].  Pursuant to the emergency declaration under the [place], 1135 waiver authority and the [employer] Act, this Virtual  Visit was conducted to reduce the patient's risk of exposure to COVID-19 and provide continuity of care for an established patient.  Services were provided through a video synchronous discussion virtually to substitute for in-person clinic visit.  Due to this being a  TeleHealth evaluation, many elements of the physical examination are unable to be assessed.     Total Time: minutes: 11-20 minutes.                Craig Staggers, MD            Dictated using voice recognition software. Proofread, but unrecognized voice recognition errors may exist.

## 2019-07-11 NOTE — Progress Notes (Signed)
[facility]  Kyomi Hector L. Oletta Darter, M.D.  [address] [name] [address]  Ph No:  [phone]  Fax:  [phone]        CHIEF COMPLAINT:  No chief complaint on file.         HISTORY OF PRESENT ILLNESS:  Patient here today for follow-up.  Has a history of hypercholesterolemia.  She is currently on atorvastatin 40 mg.  Tolerates this well.  No muscle pain no muscle cramps or joint pain.  She denies chest pain or shortness of breath.  She is compliant with medication.  She also has a history of mild intermittent asthma.  She uses albuterol MDI as needed.  Also has history of recurrent DVT.  She is currently on Xarelto and has been on this chronically.  No bleeding or other adverse reactions.  She otherwise has no other complaints.    HISTORY:  Allergies   Allergen Reactions   ??? Codeine Unable to Obtain     Past Medical History:   Diagnosis Date   ??? Allergic rhinitis, cause unspecified 11/27/2012   ??? Contact dermatitis and other eczema, due to unspecified cause 11/27/2012   ??? Encounter for long-term (current) use of other medications 11/27/2012   ??? Long term (current) use of anticoagulants 11/27/2012   ??? Nonspecific abnormal electrocardiogram (ECG) (EKG) 11/27/2012   ??? Osteoporosis    ??? 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 ABDOMEN SURGERY PROC UNLISTED     ??? HX CHOLECYSTECTOMY     ??? HX GYN      Removed tubes   ??? VASCULAR SURGERY PROCEDURE UNLIST      Left leg striped the vein     Family History   Problem Relation Age of Onset   ??? Diabetes Mother    ??? Cancer Father    ??? Asthma Father    ??? Heart Disease Father    ??? Diabetes Sister    ??? Cancer Brother      Social History     Socioeconomic History   ??? Marital status: MARRIED     Spouse name: Not on file   ??? Number of children: Not on file   ??? Years of education: Not on file   ??? Highest education level: Not on file   Occupational History   ??? Not on file   Social Needs    ??? Financial resource strain: Not on file   ??? Food insecurity     Worry: Not on file     Inability: Not on file   ??? Transportation needs     Medical: Not on file     Non-medical: Not on file   Tobacco Use   ??? Smoking status: Never Smoker   ??? Smokeless tobacco: Never Used   Substance and Sexual Activity   ??? Alcohol use: No   ??? Drug use: No   ??? Sexual activity: Not on file   Lifestyle   ??? Physical activity     Days per week: Not on file     Minutes per session: Not on file   ??? Stress: Not on file   Relationships   ??? Social Product manager on phone: Not on file     Gets together: Not on file     Attends religious service: Not on file     Active member of club or organization:  Not on file     Attends meetings of clubs or organizations: Not on file     Relationship status: Not on file   ??? Intimate partner violence     Fear of current or ex partner: Not on file     Emotionally abused: Not on file     Physically abused: Not on file     Forced sexual activity: Not on file   Other Topics Concern   ??? Not on file   Social History Narrative   ??? Not on file     Current Outpatient Medications   Medication Sig Dispense Refill   ??? rivaroxaban (Xarelto) 20 mg tab tablet TAKE 1 TABLET BY MOUTH DAILY WITH DINNER 30 Tab 5   ??? alendronate (Fosamax) 70 mg tablet Take 1 Tab by mouth every seven (7) days. 4 Tab 5   ??? montelukast (SINGULAIR) 10 mg tablet Take 1 Tab by mouth daily. 90 Tab 1   ??? albuterol (PROVENTIL HFA, VENTOLIN HFA, PROAIR HFA) 90 mcg/actuation inhaler Take 2 Puffs by inhalation every four (4) hours as needed for Wheezing (FOR SOB). 1 Inhaler 5   ??? atorvastatin (LIPITOR) 40 mg tablet TAKE 1 TABLET BY MOUTH DAILY 90 Tab 1   ??? calcium-vitamin D (OYSTER SHELL CALCIUM-VIT D3) 250-125 mg-unit tablet Take 1 Tab by mouth daily.         3 most recent PHQ Screens 01/09/2019   Little interest or pleasure in doing things Not at all   Feeling down, depressed, irritable, or hopeless Not at all   Total Score PHQ 2 0          Fall Risk Assessment, last 12 mths 01/09/2019   Able to walk? Yes   Fall in past 12 months? No   Fall with injury? -   Number of falls in past 12 months -   Fall Risk Score -         REVIEW OF SYSTEMS:  ROS  Review of systems is as stated above, otherwise is negative.       PHYSICAL EXAMINATION:    Vital Signs: (As obtained by patient/caregiver at home)  There were no vitals taken for this visit.     Constitutional: '[x]'  Appears well-developed and well-nourished '[x]'  No apparent distress      '[]'  Abnormal -     Mental status: '[x]'  Alert and awake  '[x]'  Oriented to person/place/time '[x]'  Able to follow commands    '[]'  Abnormal -     Eyes:   EOM    '[x]'   Normal    '[]'  Abnormal -   Sclera  '[x]'   Normal    '[]'  Abnormal -          Discharge '[x]'   None visible   '[]'  Abnormal -     HENT: '[x]'  Normocephalic, atraumatic  '[]'  Abnormal -   '[x]'  Mouth/Throat: Mucous membranes are moist    External Ears '[x]'  Normal  '[]'  Abnormal -    Neck: '[x]'  No visualized mass '[]'  Abnormal -     Pulmonary/Chest: '[x]'  Respiratory effort normal   '[x]'  No visualized signs of difficulty breathing or respiratory distress        '[]'  Abnormal -      Musculoskeletal:   '[x]'  Normal gait with no signs of ataxia         '[x]'  Normal range of motion of neck        '[]'  Abnormal -     Neurological:        [  x] No Facial Asymmetry (Cranial nerve 7 motor function) (limited exam due to video visit)          '[x]'  No gaze palsy        '[]'  Abnormal -          Skin:        '[x]'  No significant exanthematous lesions or discoloration noted on facial skin         '[]'  Abnormal -            Psychiatric:       '[x]'  Normal Affect '[]'  Abnormal -        '[x]'  No Hallucinations    Other pertinent observable physical exam findings:-        LABS  Results for orders placed or performed in visit on 07/03/19   CBC WITH AUTOMATED DIFF   Result Value Ref Range    WBC 3.3 (L) 3.4 - 10.8 x10E3/uL    RBC 4.16 3.77 - 5.28 x10E6/uL    HGB 13.1 11.1 - 15.9 g/dL    HCT 39.4 34.0 - 46.6 %    MCV 95 79 - 97 fL     MCH 31.5 26.6 - 33.0 pg    MCHC 33.2 31.5 - 35.7 g/dL    RDW 12.4 11.7 - 15.4 %    PLATELET 288 150 - 450 x10E3/uL    NEUTROPHILS 34 Not Estab. %    Lymphocytes 47 Not Estab. %    MONOCYTES 15 Not Estab. %    EOSINOPHILS 2 Not Estab. %    BASOPHILS 2 Not Estab. %    ABS. NEUTROPHILS 1.1 (L) 1.4 - 7.0 x10E3/uL    Abs Lymphocytes 1.5 0.7 - 3.1 x10E3/uL    ABS. MONOCYTES 0.5 0.1 - 0.9 x10E3/uL    ABS. EOSINOPHILS 0.1 0.0 - 0.4 x10E3/uL    ABS. BASOPHILS 0.1 0.0 - 0.2 x10E3/uL    IMMATURE GRANULOCYTES 0 Not Estab. %    ABS. IMM. GRANS. 0.0 0.0 - 0.1 [id number]   METABOLIC PANEL, COMPREHENSIVE   Result Value Ref Range    Glucose 101 (H) 65 - 99 mg/dL    BUN 14 8 - 27 mg/dL    Creatinine 0.79 0.57 - 1.00 mg/dL    GFR est non-AA 74 >59 mL/min/1.73    GFR est AA 86 >59 mL/min/1.73    BUN/Creatinine ratio 18 12 - 28    Sodium 142 134 - 144 mmol/L    Potassium 4.6 3.5 - 5.2 mmol/L    Chloride 104 96 - 106 mmol/L    CO2 24 20 - 29 mmol/L    Calcium 9.6 8.7 - 10.3 mg/dL    Protein, total 6.4 6.0 - 8.5 g/dL    Albumin 4.1 3.7 - 4.7 g/dL    GLOBULIN, TOTAL 2.3 1.5 - 4.5 g/dL    A-G Ratio 1.8 1.2 - 2.2    Bilirubin, total 0.5 0.0 - 1.2 mg/dL    Alk. phosphatase 41 39 - 117 IU/L    AST (SGOT) 12 0 - 40 IU/L    ALT (SGPT) 10 0 - 32 IU/L   LIPID PANEL WITH LDL/HDL RATIO   Result Value Ref Range    Cholesterol, total 130 100 - 199 mg/dL    Triglyceride 59 0 - 149 mg/dL    HDL Cholesterol 62 >39 mg/dL    VLDL, calculated 12 5 - 40 mg/dL    LDL, calculated 56 0 - 99 mg/dL    LDL/HDL Ratio 0.9  0.0 - 3.2 ratio   TSH 3RD GENERATION   Result Value Ref Range    TSH 3.720 0.450 - 4.500 uIU/mL   HEMOGLOBIN A1C WITH EAG   Result Value Ref Range    Hemoglobin A1c 5.6 4.8 - 5.6 %    Estimated average glucose 114 mg/dL           IMPRESSION/PLAN    Diagnoses and all orders for this visit:    1. Mixed hyperlipidemia    2. History of DVT (deep vein thrombosis)  -     rivaroxaban (Xarelto) 20 mg tab tablet; TAKE 1 TABLET BY MOUTH DAILY WITH DINNER     3. Age-related osteoporosis without current pathological fracture  -     alendronate (Fosamax) 70 mg tablet; Take 1 Tab by mouth every seven (7) days.    4. Mild intermittent asthma without complication        She will continue with atorvastatin 40 mg once daily for hypercholesterolemia.  Cholesterol is currently well controlled.  She has not needed to take nitroglycerin.  Prescription was last written in 2014.  Should call if she develops any chest pain.  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  Call if she notices any bruising or bleeding.  Continue with albuterol MDI 2 puffs 4 times daily as needed for wheezing shortness of breath.  Her A1c is normal at 5.6.  We will continue to monitor and recheck in 6 months.    We discussed the expected course, resolution and complications of the diagnosis(es) in detail.  Medication risks, benefits, costs, interactions, and alternatives were discussed as indicated.  I advised her to contact the office if her condition worsens, changes or fails to improve as anticipated. She expressed understanding with the diagnosis(es) and plan.        I was in the office while conducting this encounter.    Consent:  She and/or her healthcare decision maker is aware that this patient-initiated Telehealth encounter is a billable service, with coverage as determined by her insurance carrier. She is aware that she may receive a bill and has provided verbal consent to proceed: Yes    This virtual visit was conducted via [email or web address].  Pursuant to the emergency declaration under the [place], 1135 waiver authority and the [employer] Act, this Virtual  Visit was conducted to reduce the patient's risk of exposure to COVID-19 and provide continuity of care for an established patient.  Services were provided through a video synchronous discussion virtually to substitute for in-person clinic  visit.  Due to this being a TeleHealth evaluation, many elements of the physical examination are unable to be assessed.     Total Time: minutes: 11-20 minutes.                Craig Staggers, MD            Dictated using voice recognition software. Proofread, but unrecognized voice recognition errors may exist.

## 2019-12-04 NOTE — Telephone Encounter (Signed)
Patient called and she was told she had Covid. [facility] and I told her what to do.

## 2019-12-20 ENCOUNTER — Encounter

## 2019-12-20 MED ORDER — ATORVASTATIN 40 MG TAB
40 mg | ORAL_TABLET | ORAL | 1 refills | Status: DC
Start: 2019-12-20 — End: 2020-01-13

## 2019-12-20 MED ORDER — MONTELUKAST 10 MG TAB
10 mg | ORAL_TABLET | ORAL | 1 refills | Status: DC
Start: 2019-12-20 — End: 2020-01-13

## 2020-01-06 ENCOUNTER — Encounter: Admit: 2020-01-06 | Discharge: 2020-01-06 | Payer: MEDICARE | Primary: Family Medicine

## 2020-01-06 ENCOUNTER — Encounter

## 2020-01-06 DIAGNOSIS — E782 Mixed hyperlipidemia: Secondary | ICD-10-CM

## 2020-01-07 LAB — COMPREHENSIVE METABOLIC PANEL
ALT: 11 IU/L (ref 0–32)
AST: 13 IU/L (ref 0–40)
Albumin/Globulin Ratio: 1.9 NA (ref 1.2–2.2)
Albumin: 4.5 g/dL (ref 3.7–4.7)
Alkaline Phosphatase: 53 IU/L (ref 39–117)
BUN: 7 mg/dL — ABNORMAL LOW (ref 8–27)
Bun/Cre Ratio: 9 NA — ABNORMAL LOW (ref 12–28)
CO2: 23 mmol/L (ref 20–29)
Calcium: 9.7 mg/dL (ref 8.7–10.3)
Chloride: 103 mmol/L (ref 96–106)
Creatinine: 0.75 mg/dL (ref 0.57–1.00)
EGFR IF NonAfrican American: 79 mL/min/{1.73_m2} (ref >59)
GFR African American: 91 mL/min/{1.73_m2} (ref >59)
Globulin, Total: 2.4 g/dL (ref 1.5–4.5)
Glucose: 101 mg/dL — ABNORMAL HIGH (ref 65–99)
Potassium: 4.9 mmol/L (ref 3.5–5.2)
Sodium: 143 mmol/L (ref 134–144)
Total Bilirubin: 0.6 mg/dL (ref 0.0–1.2)
Total Protein: 6.9 g/dL (ref 6.0–8.5)

## 2020-01-07 LAB — CBC WITH AUTO DIFFERENTIAL
Basophils %: 2 %
Basophils Absolute: 0.1 10*3/uL (ref 0.0–0.2)
Eosinophils %: 2 %
Eosinophils Absolute: 0.1 10*3/uL (ref 0.0–0.4)
Granulocyte Absolute Count: 0 10*3/uL (ref 0.0–0.1)
Hematocrit: 43.3 % (ref 34.0–46.6)
Hemoglobin: 14.3 g/dL (ref 11.1–15.9)
Immature Granulocytes: 0 %
Lymphocytes %: 36 %
Lymphocytes Absolute: 1.6 10*3/uL (ref 0.7–3.1)
MCH: 31.2 pg (ref 26.6–33.0)
MCHC: 33 g/dL (ref 31.5–35.7)
MCV: 95 fL (ref 79–97)
Monocytes %: 12 %
Monocytes Absolute: 0.5 10*3/uL (ref 0.1–0.9)
Neutrophils %: 48 %
Neutrophils Absolute: 2.2 10*3/uL (ref 1.4–7.0)
Platelets: 315 10*3/uL (ref 150–450)
RBC: 4.58 x10E6/uL (ref 3.77–5.28)
RDW: 12.8 % (ref 11.7–15.4)
WBC: 4.5 10*3/uL (ref 3.4–10.8)

## 2020-01-07 LAB — HEMOGLOBIN A1C W/O EAG
Hemoglobin A1C: 5.8 % — ABNORMAL HIGH (ref 4.8–5.6)
Hemoglobin A1c: 5.8 % — ABNORMAL HIGH (ref 4.8–5.6)

## 2020-01-07 LAB — LIPID PANEL
Cholesterol, Total: 141 mg/dL (ref 100–199)
Cholesterol, total: 141 mg/dL (ref 100–199)
HDL Cholesterol: 66 mg/dL (ref >39)
HDL: 66 mg/dL (ref >39)
LDL Calculated: 59 mg/dL (ref 0–99)
LDL, calculated: 59 mg/dL (ref 0–99)
Triglyceride: 82 mg/dL (ref 0–149)
Triglycerides: 82 mg/dL (ref 0–149)
VLDL, calculated: 16 mg/dL (ref 5–40)
VLDL: 16 mg/dL (ref 5–40)

## 2020-01-07 LAB — TSH 3RD GENERATION
TSH: 4.99 u[IU]/mL — ABNORMAL HIGH (ref 0.450–4.500)
TSH: 4.99 u[IU]/mL — ABNORMAL HIGH (ref 0.450–4.500)

## 2020-01-07 LAB — METABOLIC PANEL, COMPREHENSIVE
A-G Ratio: 1.9 (ref 1.2–2.2)
ALT (SGPT): 11 IU/L (ref 0–32)
AST (SGOT): 13 IU/L (ref 0–40)
Albumin: 4.5 g/dL (ref 3.7–4.7)
Alk. phosphatase: 53 IU/L (ref 39–117)
BUN/Creatinine ratio: 9 — ABNORMAL LOW (ref 12–28)
BUN: 7 mg/dL — ABNORMAL LOW (ref 8–27)
Bilirubin, total: 0.6 mg/dL (ref 0.0–1.2)
CO2: 23 mmol/L (ref 20–29)
Calcium: 9.7 mg/dL (ref 8.7–10.3)
Chloride: 103 mmol/L (ref 96–106)
Creatinine: 0.75 mg/dL (ref 0.57–1.00)
GFR est AA: 91 mL/min/{1.73_m2} (ref >59)
GFR est non-AA: 79 mL/min/{1.73_m2} (ref >59)
GLOBULIN, TOTAL: 2.4 g/dL (ref 1.5–4.5)
Glucose: 101 mg/dL — ABNORMAL HIGH (ref 65–99)
Potassium: 4.9 mmol/L (ref 3.5–5.2)
Protein, total: 6.9 g/dL (ref 6.0–8.5)
Sodium: 143 mmol/L (ref 134–144)

## 2020-01-07 LAB — CBC WITH AUTOMATED DIFF
ABS. BASOPHILS: 0.1 10*3/uL (ref 0.0–0.2)
ABS. EOSINOPHILS: 0.1 10*3/uL (ref 0.0–0.4)
ABS. IMM. GRANS.: 0 10*3/uL (ref 0.0–0.1)
ABS. MONOCYTES: 0.5 10*3/uL (ref 0.1–0.9)
ABS. NEUTROPHILS: 2.2 10*3/uL (ref 1.4–7.0)
Abs Lymphocytes: 1.6 10*3/uL (ref 0.7–3.1)
BASOPHILS: 2 %
EOSINOPHILS: 2 %
HCT: 43.3 % (ref 34.0–46.6)
HGB: 14.3 g/dL (ref 11.1–15.9)
IMMATURE GRANULOCYTES: 0 %
Lymphocytes: 36 %
MCH: 31.2 pg (ref 26.6–33.0)
MCHC: 33 g/dL (ref 31.5–35.7)
MCV: 95 fL (ref 79–97)
MONOCYTES: 12 %
NEUTROPHILS: 48 %
PLATELET: 315 10*3/uL (ref 150–450)
RBC: 4.58 x10E6/uL (ref 3.77–5.28)
RDW: 12.8 % (ref 11.7–15.4)
WBC: 4.5 10*3/uL (ref 3.4–10.8)

## 2020-01-13 ENCOUNTER — Ambulatory Visit: Admit: 2020-01-13 | Discharge: 2020-01-13 | Payer: MEDICARE | Attending: Family Medicine | Primary: Family Medicine

## 2020-01-13 ENCOUNTER — Ambulatory Visit: Attending: Family Medicine | Primary: Family Medicine

## 2020-01-13 DIAGNOSIS — Z0000 Encounter for general adult medical examination without abnormal findings: Secondary | ICD-10-CM

## 2020-01-13 MED ORDER — ATORVASTATIN 40 MG TAB
40 mg | ORAL_TABLET | Freq: Every evening | ORAL | 1 refills | Status: DC
Start: 2020-01-13 — End: 2020-07-27

## 2020-01-13 MED ORDER — MONTELUKAST 10 MG TAB
10 mg | ORAL_TABLET | ORAL | 1 refills | Status: DC
Start: 2020-01-13 — End: 2020-07-27

## 2020-01-13 MED ORDER — RIVAROXABAN 20 MG TAB
20 mg | ORAL_TABLET | ORAL | 5 refills | Status: DC
Start: 2020-01-13 — End: 2020-07-20

## 2020-01-13 MED ORDER — ALBUTEROL SULFATE HFA 90 MCG/ACTUATION AEROSOL INHALER
90 mcg/actuation | RESPIRATORY_TRACT | 5 refills | Status: DC | PRN
Start: 2020-01-13 — End: 2020-07-27

## 2020-01-13 MED ORDER — ALENDRONATE 70 MG TAB
70 mg | ORAL_TABLET | ORAL | 5 refills | Status: DC
Start: 2020-01-13 — End: 2020-07-27

## 2020-01-13 NOTE — Progress Notes (Signed)
This is the Subsequent Medicare Annual Wellness Exam, performed 12 months or more after the Initial AWV or the last Subsequent AWV    I have reviewed the patient's medical history in detail and updated the computerized patient record.   Chief Complaint   Patient presents with   ??? Annual Wellness Visit     Medicare Wellness   ??? Allergic Rhinitis     Patient is requesting refills of her medication.   ??? Cholesterol Problem     Patient takes her medication at night she is requesting refills.   ??? Osteoporosis     Patient is here to follow up to Osteoporosis, she si requesting refills.     Visit Vitals  BP 129/75 (BP 1 Location: Left upper arm, BP Patient Position: Sitting, BP Cuff Size: Adult)   Pulse 78   Temp 97.9 ??F (36.6 ??C) (Temporal)   Ht 5' 1.25" (1.556 m)   Wt 166 lb (75.3 kg)   SpO2 98%   BMI 31.11 kg/m??     Here for annual wellness visit.  Had Covid last month.  Had minimal symptoms.  Rest of her family was ill.  She has been on vitamin D and calcium supplements.  She has a slight cough that has persisted.  No fever chills nausea vomiting or diarrhea.  No loss of smell or taste.  She is here to review labs.  She has an active lifestyle although she states that she has been a lot less mobile over the past few months due to COVID-19.  She was told she needed to wait 3 months before she could have the vaccine.  She otherwise has no other complaints.  Manages all of her ADLs independently.  She has no concerns about her memory.    Depression Risk Factor Screening:     3 most recent PHQ Screens 01/13/2020   Little interest or pleasure in doing things Not at all   Feeling down, depressed, irritable, or hopeless Not at all   Total Score PHQ 2 0       Alcohol Risk Screen    Do you average more than 1 drink per night or more than 7 drinks a week:  No    On any one occasion in the past three months have you have had more than 3 drinks containing alcohol:  No        Functional Ability and Level of Safety:    Hearing:  Hearing is good.      Activities of Daily Living:  The home contains: handrails  Patient does total self care      Ambulation: with no difficulty     Fall Risk:  Fall Risk Assessment, last 12 mths 01/13/2020   Able to walk? Yes   Fall in past 12 months? 0   Do you feel unsteady? 0   Are you worried about falling 0   Number of falls in past 12 months -   Fall with injury? -      Abuse Screen:  Patient is not abused       Cognitive Screening    Has your family/caregiver stated any concerns about your memory: no     Cognitive Screening: Normal - Clock Drawing Test    Review of Systems   All other systems reviewed and are negative.      Physical Exam  Constitutional:       Appearance: She is well-developed.   HENT:      Head:  Normocephalic and atraumatic.   Eyes:      Pupils: Pupils are equal, round, and reactive to light.   Neck:      Musculoskeletal: Normal range of motion and neck supple.   Cardiovascular:      Rate and Rhythm: Normal rate and regular rhythm.      Heart sounds: Normal heart sounds.   Pulmonary:      Effort: Pulmonary effort is normal.      Breath sounds: Normal breath sounds.   Musculoskeletal: Normal range of motion.   Neurological:      Mental Status: She is alert and oriented to person, place, and time.   Psychiatric:         Behavior: Behavior normal.         Thought Content: Thought content normal.         Judgment: Judgment normal.           Assessment/Plan   Education and counseling provided:  Are appropriate based on today's review and evaluation  Pneumococcal Vaccine  Influenza Vaccine  Screening Mammography  Screening Pap and pelvic (covered once every 2 years)  Colorectal cancer screening tests  Cardiovascular screening blood test  Bone mass measurement (DEXA)    Diagnoses and all orders for this visit:    1. Medicare annual wellness visit, subsequent    2. Mild intermittent asthma without complication  -     albuterol (PROVENTIL HFA, VENTOLIN HFA, PROAIR HFA) 90 mcg/actuation inhaler; Take  2 Puffs by inhalation every four (4) hours as needed for Wheezing (FOR SOB).    3. Age-related osteoporosis without current pathological fracture  -     alendronate (Fosamax) 70 mg tablet; Take 1 Tab by mouth every seven (7) days.    4. Mixed hyperlipidemia  -     atorvastatin (LIPITOR) 40 mg tablet; Take 1 Tab by mouth nightly.    5. Environmental allergies  -     montelukast (SINGULAIR) 10 mg tablet; TAKE 1 TABLET BY MOUTH DAILY    6. History of DVT (deep vein thrombosis)  -     rivaroxaban (Xarelto) 20 mg tab tablet; TAKE 1 TABLET BY MOUTH DAILY WITH DINNER    7. Impaired fasting blood sugar    8. Elevated TSH    9. Encounter for screening mammogram for malignant neoplasm of breast  -     MAM MAMMO BI SCREENING INCL CAD; Future    10. Encounter for immunization      Labs are all stable.  A1c is mildly elevated at 5.8.  Encouraged to continue low carbohydrate diet and regular exercise.  We will recheck labs in 6 months.  She has history of mild intermittent asthma.  Refilled albuterol.  Encouraged her to continue with vitamin D supplementation which may have helped with her Covid infection.  She should get the vaccine when it is offered to her in 3 months.  Cholesterol is well controlled.  Continue to monitor.  She noted to have mildly elevated A1c.  We will continue to monitor and recheck in 3 months.  I did advise her that she can still get Covid second time even after having had it.  She is encouraged to continue to use precautions and wear a mask and use hand sanitizer when available.  TSH is mildly elevated at 4.9.  We will recheck this again in 3 months.      Health Maintenance Due     Health Maintenance Due   Topic Date  Due   ??? COVID-19 Vaccine (1 of 2) 03/21/1962   ??? GLAUCOMA SCREENING Q2Y  03/22/2011   ??? Flu Vaccine (1) 07/30/2019   ??? Breast Cancer Screen Mammogram  12/29/2019       Patient Care Team   Patient Care Team:  Norval Morton, MD as PCP - General (Family Medicine)  Norval Morton, MD  as PCP - [facility] Empaneled Provider    History     Patient Active Problem List   Diagnosis Code   ??? Nonspecific abnormal electrocardiogram (ECG) (EKG) R94.31   ??? 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 Mild intermittent asthma without complication J45.20   ??? Impaired fasting glucose R73.01   ??? History of DVT (deep vein thrombosis) Z86.718   ??? 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 Age-related osteoporosis without current pathological fracture M81.0     Past Medical History:   Diagnosis Date   ??? Allergic rhinitis, cause unspecified 11/27/2012   ??? Contact dermatitis and other eczema, due to unspecified cause 11/27/2012   ??? Encounter for long-term (current) use of other medications 11/27/2012   ??? Long term (current) use of anticoagulants 11/27/2012   ??? Nonspecific abnormal electrocardiogram (ECG) (EKG) 11/27/2012   ??? Osteoporosis    ??? 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 HX CHOLECYSTECTOMY     ??? HX GYN      Removed tubes   ??? PR ABDOMEN SURGERY PROC UNLISTED     ??? VASCULAR SURGERY PROCEDURE UNLIST      Left leg striped the vein     Current Outpatient Medications   Medication Sig Dispense Refill   ??? albuterol (PROVENTIL HFA, VENTOLIN HFA, PROAIR HFA) 90 mcg/actuation inhaler Take 2 Puffs by inhalation every four (4) hours as needed for Wheezing (FOR SOB). 1 Inhaler 5   ??? alendronate (Fosamax) 70 mg tablet Take 1 Tab by mouth every seven (7) days. 4 Tab 5   ??? atorvastatin (LIPITOR) 40 mg tablet Take 1 Tab by mouth nightly. 90 Tab 1   ??? montelukast (SINGULAIR) 10 mg tablet TAKE 1 TABLET BY MOUTH DAILY 90 Tab 1   ??? rivaroxaban (Xarelto) 20 mg tab tablet TAKE 1 TABLET BY MOUTH DAILY WITH DINNER 30 Tab 5   ??? calcium-vitamin D (OYSTER SHELL CALCIUM-VIT D3) 250-125 mg-unit tablet Take 1 Tab by mouth daily.       Allergies   Allergen Reactions   ??? Codeine Unable to Obtain       Family History   Problem Relation Age of Onset   ??? Diabetes Mother     ??? Cancer Father    ??? Asthma Father    ??? Heart Disease Father    ??? Diabetes Sister    ??? Cancer Brother      Social History     Tobacco Use   ??? Smoking status: Never Smoker   ??? Smokeless tobacco: Never Used   Substance Use Topics   ??? Alcohol use: No   Patient does not have a Power of Attorney a form was given to her to fill out and return.

## 2020-01-13 NOTE — Patient Instructions (Signed)
Medicare Wellness Visit, Female     The best way to live healthy is to have a lifestyle where you eat a well-balanced diet, exercise regularly, limit alcohol use, and quit all forms of tobacco/nicotine, if applicable.     Regular preventive services are another way to keep healthy. Preventive services (vaccines, screening tests, monitoring & exams) can help personalize your care plan, which helps you manage your own care. Screening tests can find health problems at the earliest stages, when they are easiest to treat.   [facility] follows the current, evidence-based guidelines published by the [place]  [employer] (USPSTF) when recommending preventive services for our patients. Because we follow these guidelines, sometimes recommendations change over time as research supports it. (For example, mammograms used to be recommended annually. Even though Medicare will still pay for an annual mammogram, the newer guidelines recommend a mammogram every two years for women of average risk).  Of course, you and your doctor may decide to screen more often for some diseases, based on your risk and your co-morbidities (chronic disease you are already diagnosed with).     Preventive services for you include:  - Medicare offers their members a free annual wellness visit, which is time for you and your primary care provider to discuss and plan for your preventive service needs. Take advantage of this benefit every year!  -All adults over the age of 27 should receive the recommended pneumonia vaccines. Current USPSTF guidelines recommend a series of two vaccines for the best pneumonia protection.   -All adults should have a flu vaccine yearly and a tetanus vaccine every 10 years.   -All adults age 80 and older should receive the shingles vaccines (series of two vaccines).      -All adults age 30-70 who are overweight should have a diabetes screening test once every three years.    -All adults born between [birth year] and 1965 should be screened once for Hepatitis C.  -Other screening tests and preventive services for persons with diabetes include: an eye exam to screen for diabetic retinopathy, a kidney function test, a foot exam, and stricter control over your cholesterol.   -Cardiovascular screening for adults with routine risk involves an electrocardiogram (ECG) at intervals determined by your doctor.   -Colorectal cancer screenings should be done for adults age 33-75 with no increased risk factors for colorectal cancer.  There are a number of acceptable methods of screening for this type of cancer. Each test has its own benefits and drawbacks. Discuss with your doctor what is most appropriate for you during your annual wellness visit. The different tests include: colonoscopy (considered the best screening method), a fecal occult blood test, a fecal DNA test, and sigmoidoscopy.    -A bone mass density test is recommended when a woman turns 65 to screen for osteoporosis. This test is only recommended one time, as a screening. Some providers will use this same test as a disease monitoring tool if you already have osteoporosis.  -Breast cancer screenings are recommended every other year for women of normal risk, age 26-74.  -Cervical cancer screenings for women over age 8 are only recommended with certain risk factors.     Here is a list of your current Health Maintenance items (your personalized list of preventive services) with a due date:  Health Maintenance Due   Topic Date Due   ??? COVID-19 Vaccine (1 of 2) 03/21/1962   ??? Glaucoma Screening   03/22/2011   ???  Yearly Flu Vaccine (1) 07/30/2019   ??? Mammogram  12/29/2019         Vaccine Information Statement    Influenza (Flu) Vaccine (Inactivated or Recombinant): What You Need to Know    Many Vaccine Information Statements are available in Spanish and other languages. See PromoAge.com.br   Hojas de informaci??n sobre vacunas est??n disponibles en espa??ol y en muchos otros idiomas. Visite PromoAge.com.br    1. Why get vaccinated?    Influenza vaccine can prevent influenza (flu).    Flu is a contagious disease that spreads around the [place] every year, usually between October and May. Anyone can get the flu, but it is more dangerous for some people. Infants and young children, people 57 years of age and older, pregnant women, and people with certain health conditions or a weakened immune system are at greatest risk of flu complications.    Pneumonia, bronchitis, sinus infections and ear infections are examples of flu-related complications. If you have a medical condition, such as heart disease, cancer or diabetes, flu can make it worse.    Flu can cause fever and chills, sore throat, muscle aches, fatigue, cough, headache, and runny or stuffy nose. Some people may have vomiting and diarrhea, though this is more common in children than adults.     Each year thousands of people in the [place] die from flu, and many more are hospitalized. Flu vaccine prevents millions of illnesses and flu-related visits to the doctor each year.    2. Influenza vaccines     CDC recommends everyone 68 months of age and older get vaccinated every flu season. Children 6 months through 41 years of age may need 2 doses during a single flu season.  Everyone else needs only 1 dose each flu season.    It takes about 2 weeks for protection to develop after vaccination.    There are many flu viruses, and they are always changing. Each year a new flu vaccine is made to protect against three or four viruses that are likely to cause disease in the upcoming flu season. Even when the vaccine doesn???t exactly match these viruses, it may still provide some protection.     Influenza vaccine does not cause flu.    Influenza vaccine may be given at the same time as other vaccines.    3. Talk with your health care provider     Tell your vaccine provider if the person getting the vaccine:  ??? Has had an allergic reaction after a previous dose of influenza vaccine, or has any severe, life-threatening allergies.   ??? Has ever had Guillain-Barr?? Syndrome (also called GBS).    In some cases, your health care provider may decide to postpone influenza vaccination to a future visit.    People with minor illnesses, such as a cold, may be vaccinated. People who are moderately or severely ill should usually wait until they recover before getting influenza vaccine.    Your health care provider can give you more information.    4. Risks of a reaction    ??? Soreness, redness, and swelling where shot is given, fever, muscle aches, and headache can happen after influenza vaccine.  ??? There may be a very small increased risk of Guillain-Barr?? Syndrome (GBS) after inactivated influenza vaccine (the flu shot).    Young children who get the flu shot along with pneumococcal vaccine (PCV13), and/or DTaP vaccine at the same time might be slightly more likely to have a seizure  caused by fever. Tell your health care provider if a child who is getting flu vaccine has ever had a seizure.    People sometimes faint after medical procedures, including vaccination. Tell your provider if you feel dizzy or have vision changes or ringing in the ears.    As with any medicine, there is a very remote chance of a vaccine causing a severe allergic reaction, other serious injury, or death.    5. What if there is a serious problem?    An allergic reaction could occur after the vaccinated person leaves the clinic. If you see signs of a severe allergic reaction (hives, swelling of the face and throat, difficulty breathing, a fast heartbeat, dizziness, or weakness), call 9-1-1 and get the person to the nearest hospital.    For other signs that concern you, call your health care provider.     Adverse reactions should be reported to the Vaccine Adverse Event Reporting System (VAERS). Your health care provider will usually file this report, or you can do it yourself. Visit the VAERS website at www.vaers.SamedayNews.es or call 425-716-3373.  VAERS is only for reporting reactions, and VAERS staff do not give medical advice.    6. The National Vaccine Injury Compensation Program    The [employer] Vaccine Injury Compensation Program (VICP) is a federal program that was created to compensate people who may have been injured by certain vaccines. Visit the VICP website at GoldCloset.com.ee or call 248 585 7119 to learn about the program and about filing a claim. There is a time limit to file a claim for compensation.    7. How can I learn more?    ??? Ask your health care provider.   ??? Call your local or state health department.  ??? Contact the Centers for Disease Control and Prevention (CDC):  - Call 272-178-2940 (1-800-CDC-INFO) or  - Visit CDC???s influenza website at https://gibson.com/    Vaccine Information Statement (Interim)  Inactivated Influenza Vaccine   07/12/2018  42 [place] ?? [id number]   Department of Health and G[employer]er for Disease Control and Prevention    Office Use Only

## 2020-01-13 NOTE — Progress Notes (Signed)
This is the Subsequent Medicare Annual Wellness Exam, performed 12 months or more after the Initial AWV or the last Subsequent AWV    I have reviewed the patient's medical history in detail and updated the computerized patient record.   Chief Complaint   Patient presents with   ??? Annual Wellness Visit     Medicare Wellness   ??? Allergic Rhinitis     Patient is requesting refills of her medication.   ??? Cholesterol Problem     Patient takes her medication at night Tonya Shaffer is requesting refills.   ??? Osteoporosis     Patient is here to follow up to Osteoporosis, Tonya Shaffer si requesting refills.     Visit Vitals  BP 129/75 (BP 1 Location: Left upper arm, BP Patient Position: Sitting, BP Cuff Size: Adult)   Pulse 78   Temp 97.9 ??F (36.6 ??C) (Temporal)   Ht 5' 1.25" (1.556 m)   Wt 166 lb (75.3 kg)   SpO2 98%   BMI 31.11 kg/m??     Here for annual wellness visit.  Had Covid last month.  Had minimal symptoms.  Rest of her family was ill.  Tonya Shaffer has been on vitamin D and calcium supplements.  Tonya Shaffer has a slight cough that has persisted.  No fever chills nausea vomiting or diarrhea.  No loss of smell or taste.  Tonya Shaffer is here to review labs.  Tonya Shaffer has an active lifestyle although Tonya Shaffer states that Tonya Shaffer has been a lot less mobile over the past few months due to COVID-19.  Tonya Shaffer was told Tonya Shaffer needed to wait 3 months before Tonya Shaffer could have the vaccine.  Tonya Shaffer otherwise has no other complaints.  Manages all of her ADLs independently.  Tonya Shaffer has no concerns about her memory.    Depression Risk Factor Screening:     3 most recent PHQ Screens 01/13/2020   Little interest or pleasure in doing things Not at all   Feeling down, depressed, irritable, or hopeless Not at all   Total Score PHQ 2 0       Alcohol Risk Screen    Do you average more than 1 drink per night or more than 7 drinks a week:  No    On any one occasion in the past three months have you have had more than 3 drinks containing alcohol:  No        Functional Ability and Level of Safety:     Hearing: Hearing is good.      Activities of Daily Living:  The home contains: handrails  Patient does total self care      Ambulation: with no difficulty     Fall Risk:  Fall Risk Assessment, last 12 mths 01/13/2020   Able to walk? Yes   Fall in past 12 months? 0   Do you feel unsteady? 0   Are you worried about falling 0   Number of falls in past 12 months -   Fall with injury? -      Abuse Screen:  Patient is not abused       Cognitive Screening    Has your family/caregiver stated any concerns about your memory: no     Cognitive Screening: Normal - Clock Drawing Test    Review of Systems   All other systems reviewed and are negative.      Physical Exam  Constitutional:       Appearance: Tonya Shaffer is well-developed.   HENT:      Head:  Normocephalic and atraumatic.   Eyes:      Pupils: Pupils are equal, round, and reactive to light.   Neck:      Musculoskeletal: Normal range of motion and neck supple.   Cardiovascular:      Rate and Rhythm: Normal rate and regular rhythm.      Heart sounds: Normal heart sounds.   Pulmonary:      Effort: Pulmonary effort is normal.      Breath sounds: Normal breath sounds.   Musculoskeletal: Normal range of motion.   Neurological:      Mental Status: Tonya Shaffer is alert and oriented to person, place, and time.   Psychiatric:         Behavior: Behavior normal.         Thought Content: Thought content normal.         Judgment: Judgment normal.           Assessment/Plan   Education and counseling provided:  Are appropriate based on today's review and evaluation  Pneumococcal Vaccine  Influenza Vaccine  Screening Mammography  Screening Pap and pelvic (covered once every 2 years)  Colorectal cancer screening tests  Cardiovascular screening blood test  Bone mass measurement (DEXA)    Diagnoses and all orders for this visit:    1. Medicare annual wellness visit, subsequent    2. Mild intermittent asthma without complication   -     albuterol (PROVENTIL HFA, VENTOLIN HFA, PROAIR HFA) 90 mcg/actuation inhaler; Take 2 Puffs by inhalation every four (4) hours as needed for Wheezing (FOR SOB).    3. Age-related osteoporosis without current pathological fracture  -     alendronate (Fosamax) 70 mg tablet; Take 1 Tab by mouth every seven (7) days.    4. Mixed hyperlipidemia  -     atorvastatin (LIPITOR) 40 mg tablet; Take 1 Tab by mouth nightly.    5. Environmental allergies  -     montelukast (SINGULAIR) 10 mg tablet; TAKE 1 TABLET BY MOUTH DAILY    6. History of DVT (deep vein thrombosis)  -     rivaroxaban (Xarelto) 20 mg tab tablet; TAKE 1 TABLET BY MOUTH DAILY WITH DINNER    7. Impaired fasting blood sugar    8. Elevated TSH    9. Encounter for screening mammogram for malignant neoplasm of breast  -     MAM MAMMO BI SCREENING INCL CAD; Future    10. Encounter for immunization      Labs are all stable.  A1c is mildly elevated at 5.8.  Encouraged to continue low carbohydrate diet and regular exercise.  We will recheck labs in 6 months.  Tonya Shaffer has history of mild intermittent asthma.  Refilled albuterol.  Encouraged her to continue with vitamin D supplementation which may have helped with her Covid infection.  Tonya Shaffer should get the vaccine when it is offered to her in 3 months.  Cholesterol is well controlled.  Continue to monitor.  Tonya Shaffer noted to have mildly elevated A1c.  We will continue to monitor and recheck in 3 months.  I did advise her that Tonya Shaffer can still get Covid second time even after having had it.  Tonya Shaffer is encouraged to continue to use precautions and wear a mask and use hand sanitizer when available.  TSH is mildly elevated at 4.9.  We will recheck this again in 3 months.      Health Maintenance Due     Health Maintenance Due   Topic Date  Due   ??? COVID-19 Vaccine (1 of 2) 03/21/1962   ??? GLAUCOMA SCREENING Q2Y  03/22/2011   ??? Flu Vaccine (1) 07/30/2019   ??? Breast Cancer Screen Mammogram  12/29/2019       Patient Care Team    Patient Care Team:  Norval Morton, MD as PCP - General (Family Medicine)  Norval Morton, MD as PCP - [facility] Empaneled Provider    History     Patient Active Problem List   Diagnosis Code   ??? Nonspecific abnormal electrocardiogram (ECG) (EKG) R94.31   ??? 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 Mild intermittent asthma without complication J45.20   ??? Impaired fasting glucose R73.01   ??? History of DVT (deep vein thrombosis) Z86.718   ??? 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 Age-related osteoporosis without current pathological fracture M81.0     Past Medical History:   Diagnosis Date   ??? Allergic rhinitis, cause unspecified 11/27/2012   ??? Contact dermatitis and other eczema, due to unspecified cause 11/27/2012   ??? Encounter for long-term (current) use of other medications 11/27/2012   ??? Long term (current) use of anticoagulants 11/27/2012   ??? Nonspecific abnormal electrocardiogram (ECG) (EKG) 11/27/2012   ??? Osteoporosis    ??? 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 HX CHOLECYSTECTOMY     ??? HX GYN      Removed tubes   ??? PR ABDOMEN SURGERY PROC UNLISTED     ??? VASCULAR SURGERY PROCEDURE UNLIST      Left leg striped the vein     Current Outpatient Medications   Medication Sig Dispense Refill   ??? albuterol (PROVENTIL HFA, VENTOLIN HFA, PROAIR HFA) 90 mcg/actuation inhaler Take 2 Puffs by inhalation every four (4) hours as needed for Wheezing (FOR SOB). 1 Inhaler 5   ??? alendronate (Fosamax) 70 mg tablet Take 1 Tab by mouth every seven (7) days. 4 Tab 5   ??? atorvastatin (LIPITOR) 40 mg tablet Take 1 Tab by mouth nightly. 90 Tab 1   ??? montelukast (SINGULAIR) 10 mg tablet TAKE 1 TABLET BY MOUTH DAILY 90 Tab 1   ??? rivaroxaban (Xarelto) 20 mg tab tablet TAKE 1 TABLET BY MOUTH DAILY WITH DINNER 30 Tab 5   ??? calcium-vitamin D (OYSTER SHELL CALCIUM-VIT D3) 250-125 mg-unit tablet Take 1 Tab by mouth daily.       Allergies    Allergen Reactions   ??? Codeine Unable to Obtain       Family History   Problem Relation Age of Onset   ??? Diabetes Mother    ??? Cancer Father    ??? Asthma Father    ??? Heart Disease Father    ??? Diabetes Sister    ??? Cancer Brother      Social History     Tobacco Use   ??? Smoking status: Never Smoker   ??? Smokeless tobacco: Never Used   Substance Use Topics   ??? Alcohol use: No   Patient does not have a Power of Attorney a form was given to her to fill out and return.

## 2020-02-17 ENCOUNTER — Inpatient Hospital Stay: Admit: 2020-02-17 | Payer: MEDICARE | Attending: Family Medicine | Primary: Family Medicine

## 2020-02-17 DIAGNOSIS — Z1231 Encounter for screening mammogram for malignant neoplasm of breast: Secondary | ICD-10-CM

## 2020-02-17 NOTE — Progress Notes (Signed)
Mammogram is negative.  Repeat in 1 year.

## 2020-06-30 NOTE — Telephone Encounter (Signed)
Patient notified and verbalized understanding

## 2020-06-30 NOTE — Telephone Encounter (Signed)
Patient called requesting your advise on Covid Vaccine.  she states she had a death in the family and she needs to assist to a funeral, she doesn't know if she should go where there is going to be many people.   Is she a good candidate for vaccine?   Please advise   Thank you!

## 2020-07-06 ENCOUNTER — Encounter

## 2020-07-07 ENCOUNTER — Encounter: Primary: Family Medicine

## 2020-07-14 ENCOUNTER — Encounter: Attending: Family Medicine | Primary: Family Medicine

## 2020-07-18 ENCOUNTER — Encounter

## 2020-07-21 MED ORDER — RIVAROXABAN 20 MG TAB
20 mg | ORAL_TABLET | ORAL | 5 refills | Status: DC
Start: 2020-07-21 — End: 2020-07-27

## 2020-07-25 ENCOUNTER — Encounter

## 2020-07-27 ENCOUNTER — Ambulatory Visit: Admit: 2020-07-27 | Discharge: 2020-07-27 | Payer: MEDICARE | Attending: Family Medicine | Primary: Family Medicine

## 2020-07-27 ENCOUNTER — Ambulatory Visit: Attending: Family Medicine | Primary: Family Medicine

## 2020-07-27 DIAGNOSIS — J4520 Mild intermittent asthma, uncomplicated: Secondary | ICD-10-CM

## 2020-07-27 MED ORDER — BUDESONIDE 90 MCG/INHALATION BREATH ACTIVATED POWDER AEROSOL
90 mcg/actuation | Freq: Two times a day (BID) | RESPIRATORY_TRACT | 5 refills | Status: DC
Start: 2020-07-27 — End: 2021-02-01

## 2020-07-27 MED ORDER — ALENDRONATE 70 MG TAB
70 mg | ORAL_TABLET | ORAL | 5 refills | Status: DC
Start: 2020-07-27 — End: 2021-02-01

## 2020-07-27 MED ORDER — ATORVASTATIN 40 MG TAB
40 mg | ORAL_TABLET | Freq: Every evening | ORAL | 1 refills | Status: DC
Start: 2020-07-27 — End: 2021-02-01

## 2020-07-27 MED ORDER — RIVAROXABAN 20 MG TAB
20 mg | ORAL_TABLET | ORAL | 5 refills | Status: DC
Start: 2020-07-27 — End: 2020-11-26

## 2020-07-27 MED ORDER — MONTELUKAST 10 MG TAB
10 mg | ORAL_TABLET | ORAL | 1 refills | Status: DC
Start: 2020-07-27 — End: 2021-02-01

## 2020-07-27 MED ORDER — ALBUTEROL SULFATE HFA 90 MCG/ACTUATION AEROSOL INHALER
90 mcg/actuation | RESPIRATORY_TRACT | 5 refills | Status: DC | PRN
Start: 2020-07-27 — End: 2021-02-01

## 2020-07-27 NOTE — Progress Notes (Signed)
Patient notified.

## 2020-07-27 NOTE — Progress Notes (Signed)
Tonya Shaffer (DOB: August 07, 1946) is a 74 y.o. female, established patient, here for evaluation of the following chief complaint(s):  Osteoporosis (Patient is requesting a refill of her Fosamax.), Cholesterol Problem (Patoent takes her medication at [facility], she i srequesting refills.), Asthma (Patient states she feels like her Asthma is getting worse, she has been using her Albuterol alot more lately.), and Allergies (Patient is requesting refills.)       ASSESSMENT/PLAN:  Below is the assessment and plan developed based on review of pertinent history, physical exam, labs, studies, and medications.    1. Mild intermittent asthma without complication  -     albuterol (PROVENTIL HFA, VENTOLIN HFA, PROAIR HFA) 90 mcg/actuation inhaler; Take 2 Puffs by inhalation every four (4) hours as needed for Wheezing (FOR SOB)., Normal, Disp-1 Inhaler, R-5  2. Impaired fasting blood sugar  -     TSH 3RD GENERATION  -     METABOLIC PANEL, COMPREHENSIVE  -     LIPID PANEL  -     HEMOGLOBIN A1C W/O EAG  -     CBC WITH AUTOMATED DIFF  3. Elevated TSH  -     TSH 3RD GENERATION  -     METABOLIC PANEL, COMPREHENSIVE  -     LIPID PANEL  -     HEMOGLOBIN A1C W/O EAG  -     CBC WITH AUTOMATED DIFF  4. History of DVT (deep vein thrombosis)  -     rivaroxaban (Xarelto) 20 mg tab tablet; TAKE 1 TABLET BY MOUTH DAILY WITH DINNER, Normal, Disp-30 Tablet, R-5  5. Environmental allergies  -     montelukast (SINGULAIR) 10 mg tablet; TAKE 1 TABLET BY MOUTH DAILY, Normal, Disp-90 Tablet, R-1  6. Mixed hyperlipidemia  -     atorvastatin (LIPITOR) 40 mg tablet; Take 1 Tablet by mouth nightly., Normal, Disp-90 Tablet, R-1  -     TSH 3RD GENERATION  -     METABOLIC PANEL, COMPREHENSIVE  -     LIPID PANEL  -     HEMOGLOBIN A1C W/O EAG  -     CBC WITH AUTOMATED DIFF  7. Age-related osteoporosis without current pathological fracture  -     alendronate (Fosamax) 70 mg tablet; Take 1 Tablet by mouth every seven (7) days., Normal, Disp-4 Tablet, R-5    Started  Pulmicort 90 mcg.  Should take 2 puffs twice daily.  Reserve albuterol for as needed.  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  A1c currently well controlled at 5.8.  Continue low-carb diet regular exercise recheck in 6 months.  Continue atorvastatin for hypercholesterolemia.  Continue Fosamax for osteoporosis.      Return in about 6 months (around 01/25/2021), or if symptoms worsen or fail to improve, for labs 1 week prior to office visit.      SUBJECTIVE/OBJECTIVE:  HPI  Here today for follow-up and refills of medications.  Has not had labs done.  She feels that her asthma is getting worse.  She is having to use her albuterol inhaler every day twice a day.  Denies cough or fever.  She has not had Covid vaccine but did have Covid last year.  She is concerned about getting the vaccine.  She does not smoke.  She does have asthma.  Only uses albuterol.  She has history of hypercholesterolemia and has history of DVT.  Currently on Xarelto.  Also taking Fosamax for osteoporosis.  She takes vitamin D calcium every  day.  No other complaints.    Review of Systems    Physical Exam  Constitutional:       Appearance: She is well-developed.   HENT:      Head: Normocephalic and atraumatic.   Eyes:      Pupils: Pupils are equal, round, and reactive to light.   Cardiovascular:      Rate and Rhythm: Normal rate and regular rhythm.      Heart sounds: Normal heart sounds.   Pulmonary:      Effort: Pulmonary effort is normal.      Breath sounds: Normal breath sounds.   Musculoskeletal:         General: Normal range of motion.      Cervical back: Normal range of motion and neck supple.   Neurological:      Mental Status: She is alert and oriented to person, place, and time.   Psychiatric:         Behavior: Behavior normal.         Thought Content: Thought content normal.         Judgment: Judgment normal.           An electronic signature was used to authenticate this note.  -- Norval Morton, MD

## 2020-07-27 NOTE — Progress Notes (Signed)
Potassium level slightly elevated and platelet count is slightly elevated.  Possibly lab variance.  Remaining labs are all stable.  Recheck CBC and CMP in 3 months.

## 2020-07-28 LAB — LIPID PANEL
Cholesterol, Total: 134 mg/dL (ref 100–199)
Cholesterol, total: 134 mg/dL (ref 100–199)
HDL Cholesterol: 38 mg/dL — ABNORMAL LOW (ref >39)
HDL: 38 mg/dL — ABNORMAL LOW (ref >39)
LDL Calculated: 75 mg/dL (ref 0–99)
LDL, calculated: 75 mg/dL (ref 0–99)
Triglyceride: 117 mg/dL (ref 0–149)
Triglycerides: 117 mg/dL (ref 0–149)
VLDL, calculated: 21 mg/dL (ref 5–40)
VLDL: 21 mg/dL (ref 5–40)

## 2020-07-28 LAB — CBC WITH AUTO DIFFERENTIAL
Basophils %: 2 %
Basophils Absolute: 0.1 10*3/uL (ref 0.0–0.2)
Eosinophils %: 3 %
Eosinophils Absolute: 0.2 10*3/uL (ref 0.0–0.4)
Granulocyte Absolute Count: 0.2 10*3/uL — ABNORMAL HIGH (ref 0.0–0.1)
Hematocrit: 38.4 % (ref 34.0–46.6)
Hemoglobin: 13 g/dL (ref 11.1–15.9)
Immature Granulocytes: 2 %
Lymphocytes %: 34 %
Lymphocytes Absolute: 2.4 10*3/uL (ref 0.7–3.1)
MCH: 31.9 pg (ref 26.6–33.0)
MCHC: 33.9 g/dL (ref 31.5–35.7)
MCV: 94 fL (ref 79–97)
Monocytes %: 14 %
Monocytes Absolute: 1 10*3/uL — ABNORMAL HIGH (ref 0.1–0.9)
Neutrophils %: 45 %
Neutrophils Absolute: 3.1 10*3/uL (ref 1.4–7.0)
Platelets: 487 10*3/uL — ABNORMAL HIGH (ref 150–450)
RBC: 4.08 x10E6/uL (ref 3.77–5.28)
RDW: 12.4 % (ref 11.7–15.4)
WBC: 6.9 10*3/uL (ref 3.4–10.8)

## 2020-07-28 LAB — COMPREHENSIVE METABOLIC PANEL
ALT: 11 IU/L (ref 0–32)
AST: 15 IU/L (ref 0–40)
Albumin/Globulin Ratio: 1.3 NA (ref 1.2–2.2)
Albumin: 3.8 g/dL (ref 3.7–4.7)
Alkaline Phosphatase: 65 IU/L (ref 48–121)
BUN: 10 mg/dL (ref 8–27)
Bun/Cre Ratio: 12 NA (ref 12–28)
CO2: 25 mmol/L (ref 20–29)
Calcium: 9.4 mg/dL (ref 8.7–10.3)
Chloride: 101 mmol/L (ref 96–106)
Creatinine: 0.81 mg/dL (ref 0.57–1.00)
EGFR IF NonAfrican American: 72 mL/min/{1.73_m2} (ref >59)
GFR African American: 83 mL/min/{1.73_m2} (ref >59)
Globulin, Total: 2.9 g/dL (ref 1.5–4.5)
Glucose: 101 mg/dL — ABNORMAL HIGH (ref 65–99)
Potassium: 5.3 mmol/L — ABNORMAL HIGH (ref 3.5–5.2)
Sodium: 140 mmol/L (ref 134–144)
Total Bilirubin: 0.3 mg/dL (ref 0.0–1.2)
Total Protein: 6.7 g/dL (ref 6.0–8.5)

## 2020-07-28 LAB — HEMOGLOBIN A1C W/O EAG
Hemoglobin A1C: 5.8 % — ABNORMAL HIGH (ref 4.8–5.6)
Hemoglobin A1c: 5.8 % — ABNORMAL HIGH (ref 4.8–5.6)

## 2020-07-28 LAB — TSH 3RD GENERATION
TSH: 1.92 u[IU]/mL (ref 0.450–4.500)
TSH: 1.92 u[IU]/mL (ref 0.450–4.500)

## 2020-07-28 LAB — METABOLIC PANEL, COMPREHENSIVE
A-G Ratio: 1.3 (ref 1.2–2.2)
ALT (SGPT): 11 IU/L (ref 0–32)
AST (SGOT): 15 IU/L (ref 0–40)
Albumin: 3.8 g/dL (ref 3.7–4.7)
Alk. phosphatase: 65 IU/L (ref 48–121)
BUN/Creatinine ratio: 12 (ref 12–28)
BUN: 10 mg/dL (ref 8–27)
Bilirubin, total: 0.3 mg/dL (ref 0.0–1.2)
CO2: 25 mmol/L (ref 20–29)
Calcium: 9.4 mg/dL (ref 8.7–10.3)
Chloride: 101 mmol/L (ref 96–106)
Creatinine: 0.81 mg/dL (ref 0.57–1.00)
GFR est AA: 83 mL/min/{1.73_m2} (ref >59)
GFR est non-AA: 72 mL/min/{1.73_m2} (ref >59)
GLOBULIN, TOTAL: 2.9 g/dL (ref 1.5–4.5)
Glucose: 101 mg/dL — ABNORMAL HIGH (ref 65–99)
Potassium: 5.3 mmol/L — ABNORMAL HIGH (ref 3.5–5.2)
Protein, total: 6.7 g/dL (ref 6.0–8.5)
Sodium: 140 mmol/L (ref 134–144)

## 2020-07-28 LAB — CBC WITH AUTOMATED DIFF
ABS. BASOPHILS: 0.1 10*3/uL (ref 0.0–0.2)
ABS. EOSINOPHILS: 0.2 10*3/uL (ref 0.0–0.4)
ABS. IMM. GRANS.: 0.2 10*3/uL — ABNORMAL HIGH (ref 0.0–0.1)
ABS. MONOCYTES: 1 10*3/uL — ABNORMAL HIGH (ref 0.1–0.9)
ABS. NEUTROPHILS: 3.1 10*3/uL (ref 1.4–7.0)
Abs Lymphocytes: 2.4 10*3/uL (ref 0.7–3.1)
BASOPHILS: 2 %
EOSINOPHILS: 3 %
HCT: 38.4 % (ref 34.0–46.6)
HGB: 13 g/dL (ref 11.1–15.9)
IMMATURE GRANULOCYTES: 2 %
Lymphocytes: 34 %
MCH: 31.9 pg (ref 26.6–33.0)
MCHC: 33.9 g/dL (ref 31.5–35.7)
MCV: 94 fL (ref 79–97)
MONOCYTES: 14 %
NEUTROPHILS: 45 %
PLATELET: 487 10*3/uL — ABNORMAL HIGH (ref 150–450)
RBC: 4.08 x10E6/uL (ref 3.77–5.28)
RDW: 12.4 % (ref 11.7–15.4)
WBC: 6.9 10*3/uL (ref 3.4–10.8)

## 2020-11-25 ENCOUNTER — Encounter

## 2020-11-26 MED ORDER — RIVAROXABAN 20 MG TAB
20 mg | ORAL_TABLET | ORAL | 5 refills | Status: DC
Start: 2020-11-26 — End: 2021-02-01

## 2021-01-18 ENCOUNTER — Encounter: Payer: MEDICARE | Primary: Family Medicine

## 2021-01-18 ENCOUNTER — Encounter

## 2021-01-19 ENCOUNTER — Encounter

## 2021-01-25 ENCOUNTER — Encounter: Attending: Family Medicine | Primary: Family Medicine

## 2021-02-01 ENCOUNTER — Ambulatory Visit: Admit: 2021-02-01 | Discharge: 2021-02-01 | Payer: MEDICARE | Attending: Family Medicine | Primary: Family Medicine

## 2021-02-01 ENCOUNTER — Ambulatory Visit: Attending: Family Medicine | Primary: Family Medicine

## 2021-02-01 DIAGNOSIS — Z0000 Encounter for general adult medical examination without abnormal findings: Secondary | ICD-10-CM

## 2021-02-01 MED ORDER — SHINGRIX (PF) 50 MCG/0.5 ML INTRAMUSCULAR SUSPENSION, KIT
50 mcg/0.5 mL | INTRAMUSCULAR | 1 refills | Status: AC
Start: 2021-02-01 — End: ?

## 2021-02-01 MED ORDER — ATORVASTATIN 40 MG TAB
40 mg | ORAL_TABLET | Freq: Every evening | ORAL | 1 refills | Status: AC
Start: 2021-02-01 — End: ?

## 2021-02-01 MED ORDER — ALBUTEROL SULFATE HFA 90 MCG/ACTUATION AEROSOL INHALER
90 mcg/actuation | RESPIRATORY_TRACT | 5 refills | Status: AC | PRN
Start: 2021-02-01 — End: ?

## 2021-02-01 MED ORDER — ALENDRONATE 70 MG TAB
70 mg | ORAL_TABLET | ORAL | 5 refills | Status: AC
Start: 2021-02-01 — End: ?

## 2021-02-01 MED ORDER — RIVAROXABAN 20 MG TAB
20 mg | ORAL_TABLET | ORAL | 5 refills | Status: AC
Start: 2021-02-01 — End: ?

## 2021-02-01 MED ORDER — MONTELUKAST 10 MG TAB
10 mg | ORAL_TABLET | ORAL | 1 refills | Status: AC
Start: 2021-02-01 — End: ?

## 2021-02-01 MED ORDER — BUDESONIDE 90 MCG/INHALATION BREATH ACTIVATED POWDER AEROSOL
90 mcg/actuation | Freq: Two times a day (BID) | RESPIRATORY_TRACT | 5 refills | Status: AC
Start: 2021-02-01 — End: ?

## 2021-02-01 NOTE — Progress Notes (Signed)
This is the Subsequent Medicare Annual Wellness Exam, performed 12 months or more after the Initial AWV or the last Subsequent AWV    I have reviewed the patient's medical history in detail and updated the computerized patient record.   Chief Complaint   Patient presents with   ??? Annual Wellness Visit     Patient is here for a Medicare Wellness   ??? Allergic Rhinitis     Patient is requesting refills.   ??? Cholesterol Problem     Patient is requesting refills of her medication.   ??? Asthma     Patient i srequesting refills.     Visit Vitals  BP 137/73 (BP 1 Location: Left upper arm, BP Patient Position: Sitting, BP Cuff Size: Adult)   Pulse 75   Temp 97.4 ??F (36.3 ??C) (Temporal)   Ht 5' (1.524 m)   Wt 158 lb (71.7 kg)   SpO2 98%   BMI 30.86 kg/m??     For annual wellness visit. Has been under a lot of stress. Husband has progressively worsening dementia and cannot be left alone. She had labs and here to review those. She was told by her insurance company nurse that she no longer needed to take Xarelto on or Fosamax. She recalls having a blood clot back in 1999 and had additional blood clots a few years ago. She has history of protein C deficiency. Was told at one point that she would need to remain on Xarelto. She tolerates this well. No bleeding. She otherwise has no other complaints.    Assessment/Plan   Education and counseling provided:  Are appropriate based on today's review and evaluation  Pneumococcal Vaccine  Screening Mammography  Colorectal cancer screening tests  Cardiovascular screening blood test  Bone mass measurement (DEXA)  Screening for glaucoma    1. Medicare annual wellness visit, subsequent  2. Mild intermittent asthma without complication  -     albuterol (PROVENTIL HFA, VENTOLIN HFA, PROAIR HFA) 90 mcg/actuation inhaler; Take 2 Puffs by inhalation every four (4) hours as needed for Wheezing (FOR SOB)., Normal, Disp-1 Each, R-5  3. Age-related osteoporosis without current pathological fracture  -      alendronate (Fosamax) 70 mg tablet; Take 1 Tablet by mouth every seven (7) days., Normal, Disp-4 Tablet, R-5  4. Mixed hyperlipidemia  -     atorvastatin (LIPITOR) 40 mg tablet; Take 1 Tablet by mouth nightly., Normal, Disp-90 Tablet, R-1  5. Environmental allergies  -     montelukast (SINGULAIR) 10 mg tablet; TAKE 1 TABLET BY MOUTH DAILY, Normal, Disp-90 Tablet, R-1  6. History of DVT (deep vein thrombosis)  -     rivaroxaban (Xarelto) 20 mg tab tablet; TAKE 1 TABLET BY MOUTH DAILY WITH DINNER, Normal, Disp-30 Tablet, R-5  7. Osteopenia of lumbar spine  -     DEXA BONE DENSITY STUDY AXIAL; Future  8. Protein C deficiency (HCC)  9. Encounter for screening mammogram for malignant neoplasm of breast  -     MAM MAMMO BI SCREENING INCL CAD; Future  10. Screening for osteoporosis  -     DEXA BONE DENSITY STUDY AXIAL; Future  11. Long term (current) use of bisphosphonates   -     DEXA BONE DENSITY STUDY AXIAL; Future     We are rechecking bone density test. Possibly discontinue Fosamax.  Getting mammogram screen for breast cancer.  She will remain on Xarelto. She has history of protein C deficiency. Has not had any clots  since starting medication. She remains extremely reluctant to discontinue. She is aware of the risk of bleeding while on medication.  Labs are reviewed. Continue with current medications as prescribed. Otherwise we will see her back here in 6 months  She indicates that she does have a living will on file.    Depression Risk Factor Screening     3 most recent PHQ Screens 02/01/2021   Little interest or pleasure in doing things Not at all   Feeling down, depressed, irritable, or hopeless Not at all   Total Score PHQ 2 0       Alcohol & Drug Abuse Risk Screen    Do you average more than 1 drink per night or more than 7 drinks a week:  No    On any one occasion in the past three months have you have had more than 3 drinks containing alcohol:  No          Functional Ability and Level of Safety    Hearing:  Hearing is good.      Activities of Daily Living:  The home contains: handrails and grab bars  Patient does total self care      Ambulation: with no difficulty     Fall Risk:  Fall Risk Assessment, last 12 mths 02/01/2021   Able to walk? Yes   Fall in past 12 months? 0   Do you feel unsteady? 0   Are you worried about falling 0   Number of falls in past 12 months -   Fall with injury? -      Abuse Screen:  Patient is not abused       Cognitive Screening    Has your family/caregiver stated any concerns about your memory: no     Cognitive Screening: Normal - Clock Drawing Test    Health Maintenance Due     Health Maintenance Due   Topic Date Due   ??? COVID-19 Vaccine (1) Never done   ??? Shingrix Vaccine Age 75> (1 of 2) Never done       Patient Care Team   Patient Care Team:  Norval Morton, MD as PCP - General (Family Medicine)  Norval Morton, MD as PCP - [facility] Empaneled Provider    History     Patient Active Problem List   Diagnosis Code   ??? Nonspecific abnormal electrocardiogram (ECG) (EKG) R94.31   ??? 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 Mild intermittent asthma without complication J45.20   ??? Impaired fasting glucose R73.01   ??? History of DVT (deep vein thrombosis) Z86.718   ??? 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 Age-related osteoporosis without current pathological fracture M81.0     Past Medical History:   Diagnosis Date   ??? Allergic rhinitis, cause unspecified 11/27/2012   ??? Contact dermatitis and other eczema, due to unspecified cause 11/27/2012   ??? Encounter for long-term (current) use of other medications 11/27/2012   ??? Long term (current) use of anticoagulants 11/27/2012   ??? Nonspecific abnormal electrocardiogram (ECG) (EKG) 11/27/2012   ??? Osteoporosis    ??? 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 HX CHOLECYSTECTOMY     ??? HX GYN      Removed tubes   ??? PR ABDOMEN SURGERY PROC UNLISTED     ??? VASCULAR SURGERY  PROCEDURE UNLIST  Left leg striped the vein     Current Outpatient Medications   Medication Sig Dispense Refill   ??? albuterol (PROVENTIL HFA, VENTOLIN HFA, PROAIR HFA) 90 mcg/actuation inhaler Take 2 Puffs by inhalation every four (4) hours as needed for Wheezing (FOR SOB). 1 Each 5   ??? alendronate (Fosamax) 70 mg tablet Take 1 Tablet by mouth every seven (7) days. 4 Tablet 5   ??? atorvastatin (LIPITOR) 40 mg tablet Take 1 Tablet by mouth nightly. 90 Tablet 1   ??? budesonide (PULMICORT FLEXHALER) 90 mcg/actuation aepb inhaler Take 2 Puffs by inhalation two (2) times a day. 1 Each 5   ??? montelukast (SINGULAIR) 10 mg tablet TAKE 1 TABLET BY MOUTH DAILY 90 Tablet 1   ??? rivaroxaban (Xarelto) 20 mg tab tablet TAKE 1 TABLET BY MOUTH DAILY WITH DINNER 30 Tablet 5   ??? varicella-zoster recombinant, PF, (Shingrix, PF,) 50 mcg/0.5 mL susr injection 0.65mL by IntraMUSCular route once now and then repeat in 2-6 months 0.5 mL 1   ??? calcium-vitamin D (OYSTER SHELL CALCIUM-VIT D3) 250-125 mg-unit tablet Take 1 Tab by mouth daily.       Allergies   Allergen Reactions   ??? Codeine Unable to Obtain       Family History   Problem Relation Age of Onset   ??? Diabetes Mother    ??? Cancer Father    ??? Asthma Father    ??? Heart Disease Father    ??? Diabetes Sister    ??? Cancer Brother      Social History     Tobacco Use   ??? Smoking status: Never Smoker   ??? Smokeless tobacco: Never Used   Substance Use Topics   ??? Alcohol use: No     Patient does not have a Power of Attorney she was given a form to fill out and return.    Caryn Section, LPN

## 2021-02-02 LAB — CBC WITH AUTO DIFFERENTIAL
Basophils %: 2 %
Basophils Absolute: 0.1 10*3/uL (ref 0.0–0.2)
Eosinophils %: 1 %
Eosinophils Absolute: 0.1 10*3/uL (ref 0.0–0.4)
Granulocyte Absolute Count: 0 10*3/uL (ref 0.0–0.1)
Hematocrit: 40.9 % (ref 34.0–46.6)
Hemoglobin: 13.5 g/dL (ref 11.1–15.9)
Immature Granulocytes: 0 %
Lymphocytes %: 45 %
Lymphocytes Absolute: 2 10*3/uL (ref 0.7–3.1)
MCH: 31.6 pg (ref 26.6–33.0)
MCHC: 33 g/dL (ref 31.5–35.7)
MCV: 96 fL (ref 79–97)
Monocytes %: 11 %
Monocytes Absolute: 0.5 10*3/uL (ref 0.1–0.9)
Neutrophils %: 41 %
Neutrophils Absolute: 1.9 10*3/uL (ref 1.4–7.0)
Platelets: 313 10*3/uL (ref 150–450)
RBC: 4.27 x10E6/uL (ref 3.77–5.28)
RDW: 12.8 % (ref 11.7–15.4)
WBC: 4.5 10*3/uL (ref 3.4–10.8)

## 2021-02-02 LAB — LIPID PANEL
Cholesterol, Total: 149 mg/dL (ref 100–199)
Cholesterol, total: 149 mg/dL (ref 100–199)
HDL Cholesterol: 69 mg/dL (ref >39)
HDL: 69 mg/dL (ref >39)
LDL Calculated: 64 mg/dL (ref 0–99)
LDL, calculated: 64 mg/dL (ref 0–99)
Triglyceride: 87 mg/dL (ref 0–149)
Triglycerides: 87 mg/dL (ref 0–149)
VLDL, calculated: 16 mg/dL (ref 5–40)
VLDL: 16 mg/dL (ref 5–40)

## 2021-02-02 LAB — COMPREHENSIVE METABOLIC PANEL
ALT: 9 IU/L (ref 0–32)
AST: 18 IU/L (ref 0–40)
Albumin/Globulin Ratio: 1.6 NA (ref 1.2–2.2)
Albumin: 4.5 g/dL (ref 3.7–4.7)
Alkaline Phosphatase: 41 IU/L — ABNORMAL LOW (ref 44–121)
BUN: 10 mg/dL (ref 8–27)
Bun/Cre Ratio: 12 NA (ref 12–28)
CO2: 23 mmol/L (ref 20–29)
Calcium: 9.8 mg/dL (ref 8.7–10.3)
Chloride: 100 mmol/L (ref 96–106)
Creatinine: 0.83 mg/dL (ref 0.57–1.00)
Est, Glomerular Filtration Rate: 74 mL/min/{1.73_m2} (ref >59)
Globulin, Total: 2.9 g/dL (ref 1.5–4.5)
Glucose: 96 mg/dL (ref 65–99)
Potassium: 5.1 mmol/L (ref 3.5–5.2)
Sodium: 139 mmol/L (ref 134–144)
Total Bilirubin: 0.4 mg/dL (ref 0.0–1.2)
Total Protein: 7.4 g/dL (ref 6.0–8.5)

## 2021-02-02 LAB — HEMOGLOBIN A1C W/O EAG
Hemoglobin A1C: 5.6 % (ref 4.8–5.6)
Hemoglobin A1c: 5.6 % (ref 4.8–5.6)

## 2021-02-02 LAB — TSH 3RD GENERATION
TSH: 3.23 u[IU]/mL (ref 0.450–4.500)
TSH: 3.23 u[IU]/mL (ref 0.450–4.500)

## 2021-02-02 LAB — CBC WITH AUTOMATED DIFF
ABS. BASOPHILS: 0.1 10*3/uL (ref 0.0–0.2)
ABS. EOSINOPHILS: 0.1 10*3/uL (ref 0.0–0.4)
ABS. IMM. GRANS.: 0 10*3/uL (ref 0.0–0.1)
ABS. MONOCYTES: 0.5 10*3/uL (ref 0.1–0.9)
ABS. NEUTROPHILS: 1.9 10*3/uL (ref 1.4–7.0)
Abs Lymphocytes: 2 10*3/uL (ref 0.7–3.1)
BASOPHILS: 2 %
EOSINOPHILS: 1 %
HCT: 40.9 % (ref 34.0–46.6)
HGB: 13.5 g/dL (ref 11.1–15.9)
IMMATURE GRANULOCYTES: 0 %
Lymphocytes: 45 %
MCH: 31.6 pg (ref 26.6–33.0)
MCHC: 33 g/dL (ref 31.5–35.7)
MCV: 96 fL (ref 79–97)
MONOCYTES: 11 %
NEUTROPHILS: 41 %
PLATELET: 313 10*3/uL (ref 150–450)
RBC: 4.27 x10E6/uL (ref 3.77–5.28)
RDW: 12.8 % (ref 11.7–15.4)
WBC: 4.5 10*3/uL (ref 3.4–10.8)

## 2021-02-02 LAB — METABOLIC PANEL, COMPREHENSIVE
A-G Ratio: 1.6 (ref 1.2–2.2)
ALT (SGPT): 9 IU/L (ref 0–32)
AST (SGOT): 18 IU/L (ref 0–40)
Albumin: 4.5 g/dL (ref 3.7–4.7)
Alk. phosphatase: 41 IU/L — ABNORMAL LOW (ref 44–121)
BUN/Creatinine ratio: 12 (ref 12–28)
BUN: 10 mg/dL (ref 8–27)
Bilirubin, total: 0.4 mg/dL (ref 0.0–1.2)
CO2: 23 mmol/L (ref 20–29)
Calcium: 9.8 mg/dL (ref 8.7–10.3)
Chloride: 100 mmol/L (ref 96–106)
Creatinine: 0.83 mg/dL (ref 0.57–1.00)
GLOBULIN, TOTAL: 2.9 g/dL (ref 1.5–4.5)
Glucose: 96 mg/dL (ref 65–99)
Potassium: 5.1 mmol/L (ref 3.5–5.2)
Protein, total: 7.4 g/dL (ref 6.0–8.5)
Sodium: 139 mmol/L (ref 134–144)
eGFR: 74 mL/min/{1.73_m2} (ref >59)

## 2021-02-23 ENCOUNTER — Emergency Department
Admission: EM | Admit: 2021-02-23 | Discharge: 2021-02-23 | Disposition: A | Discharge status: Home / Self Care | Payer: Medicare Other | Attending: Emergency Medicine | Admitting: Emergency Medicine

## 2021-02-23 ENCOUNTER — Other Ambulatory Visit: Payer: Self-pay

## 2021-02-23 ENCOUNTER — Emergency Department: Payer: Medicare Other

## 2021-02-23 DIAGNOSIS — R911 Solitary pulmonary nodule: Secondary | ICD-10-CM

## 2021-02-23 DIAGNOSIS — J189 Pneumonia, unspecified organism: Secondary | ICD-10-CM

## 2021-02-23 DIAGNOSIS — Z79899 Other long term (current) drug therapy: Secondary | ICD-10-CM | POA: Diagnosis not present

## 2021-02-23 DIAGNOSIS — Z7984 Long term (current) use of oral hypoglycemic drugs: Secondary | ICD-10-CM | POA: Insufficient documentation

## 2021-02-23 DIAGNOSIS — M791 Myalgia, unspecified site: Secondary | ICD-10-CM | POA: Insufficient documentation

## 2021-02-23 DIAGNOSIS — R739 Hyperglycemia, unspecified: Secondary | ICD-10-CM

## 2021-02-23 DIAGNOSIS — I10 Essential (primary) hypertension: Secondary | ICD-10-CM | POA: Diagnosis not present

## 2021-02-23 DIAGNOSIS — Z20822 Contact with and (suspected) exposure to covid-19: Secondary | ICD-10-CM | POA: Insufficient documentation

## 2021-02-23 DIAGNOSIS — I2584 Coronary atherosclerosis due to calcified coronary lesion: Secondary | ICD-10-CM | POA: Diagnosis not present

## 2021-02-23 DIAGNOSIS — Z85850 Personal history of malignant neoplasm of thyroid: Secondary | ICD-10-CM | POA: Diagnosis not present

## 2021-02-23 DIAGNOSIS — I2510 Atherosclerotic heart disease of native coronary artery without angina pectoris: Secondary | ICD-10-CM | POA: Diagnosis not present

## 2021-02-23 DIAGNOSIS — Z951 Presence of aortocoronary bypass graft: Secondary | ICD-10-CM | POA: Diagnosis not present

## 2021-02-23 DIAGNOSIS — E039 Hypothyroidism, unspecified: Secondary | ICD-10-CM | POA: Insufficient documentation

## 2021-02-23 DIAGNOSIS — R072 Precordial pain: Secondary | ICD-10-CM | POA: Diagnosis present

## 2021-02-23 DIAGNOSIS — E1165 Type 2 diabetes mellitus with hyperglycemia: Secondary | ICD-10-CM | POA: Diagnosis not present

## 2021-02-23 LAB — BASIC METABOLIC PANEL
Anion gap: 11 (ref 5–15)
BUN: 15 mg/dL (ref 8–23)
CO2: 25 mmol/L (ref 22–32)
Calcium: 9.3 mg/dL (ref 8.9–10.3)
Chloride: 100 mmol/L (ref 98–111)
Creatinine, Ser: 0.64 mg/dL (ref 0.44–1.00)
GFR, Estimated: >60 mL/min (ref >60)
Glucose, Bld: 204 mg/dL — ABNORMAL HIGH (ref 70–99)
Potassium: 3.5 mmol/L (ref 3.5–5.1)
Sodium: 136 mmol/L (ref 135–145)

## 2021-02-23 LAB — TROPONIN I (HIGH SENSITIVITY)
Troponin I (High Sensitivity): 4 ng/L (ref <18)
Troponin I (High Sensitivity): 4 ng/L (ref <18)

## 2021-02-23 LAB — CBC
HCT: 40 % (ref 36.0–46.0)
Hemoglobin: 13.7 g/dL (ref 12.0–15.0)
MCH: 29.8 pg (ref 26.0–34.0)
MCHC: 34.3 g/dL (ref 30.0–36.0)
MCV: 87.1 fL (ref 80.0–100.0)
Platelets: 160 10*3/uL (ref 150–400)
RBC: 4.59 MIL/uL (ref 3.87–5.11)
RDW: 11.9 % (ref 11.5–15.5)
WBC: 4.1 10*3/uL (ref 4.0–10.5)
nRBC: 0 % (ref 0.0–0.2)

## 2021-02-23 LAB — RESP PANEL BY RT-PCR (FLU A&B, COVID) ARPGX2
Influenza A by PCR: NEGATIVE
Influenza B by PCR: NEGATIVE
SARS Coronavirus 2 by RT PCR: NEGATIVE

## 2021-02-23 MED ORDER — DOXYCYCLINE HYCLATE 100 MG PO CAPS: 100.0000 mg | ORAL_CAPSULE | Freq: Two times a day (BID) | ORAL | 0 refills | Status: AC

## 2021-02-23 MED ORDER — IOHEXOL 350 MG/ML SOLN
75.0000 mL | Freq: Once | INTRAVENOUS | Status: AC | PRN
  Administered 2021-02-23: 75 mL via INTRAVENOUS
  Filled 2021-02-23: qty 75

## 2021-02-23 NOTE — ED Triage Notes (Signed)
Pt c/o left sided chest pain that radiates into the shoulder since Saturday, states she thought it was related to moving furniture on Saturday . States it has been intermittent and persistent with just "feeling tired and jittery".  Pt denies SOB/N or diaphoresis.

## 2021-02-23 NOTE — Discharge Instructions (Signed)
Your chest CT today showed: 1. No pulmonary embolism. 2. Mild patchy indistinct left upper lobe ground-glass opacities, nonspecific, probably inflammatory or atypical infection. 3. Peripheral left lower lobe 0.4 cm solid pulmonary nodule. No follow-up needed if patient is low-risk. Non-contrast chest CT can be considered in 12 months if patient is high-risk. This recommendation follows the consensus statement: Guidelines for Management of Incidental Pulmonary Nodules Detected on CT Images: From the Fleischner Society 2017; Radiology 2017; 284:228-243. 4. Three-vessel coronary atherosclerosis. 5. Aortic Atherosclerosis (ICD10-I70.0).

## 2021-02-23 NOTE — ED Provider Notes (Signed)
[facility] Emergency Department Provider Note  ____________________________________________   Event Date/Time   First MD Initiated Contact with Patient 02/23/21 1203     (approximate)  I have reviewed the triage vital signs and the nursing notes.   HISTORY  Chief Complaint Chest Pain   HPI Sarah [name] is a 75 y.o. female with a past medical history of arthritis, DM, HTN, HDL, hypothyroidism and IBS who presents for assessment of approximately 4 days of some intermittent sharp left substernal chest pain rating into her left shoulder.  Patient states she started feeling this way after moving some furniture 4 days ago.  Just endorses little bit of shortness of breath and mild nonproductive cough as well as some myalgias.  Denies any fevers, chills, headache and earache, sore throat, nausea, vomiting, diarrhea it is different from her usual loose stools, urinary symptoms, abdominal pain, back pain, rash or extremity pain.  No recent falls or injuries.  She denies tobacco abuse regular EtOH use or illicit drug use.  No history of DVT/PE.  She is not on any blood thinners.  No prior similar episodes.  No clear alleviating aggravating factors including exertion or positional factors.         Past Medical History:  Diagnosis Date  . Arthritis    hands  . Cancer [facility])    thyroid- radiation  . Diabetes mellitus without complication ([place])   . Hyperlipidemia   . Hypertension   . Hypothyroidism   . IBS (irritable bowel syndrome)     There are no problems to display for this patient.   Past Surgical History:  Procedure Laterality Date  . ABDOMINAL HYSTERECTOMY    . CATARACT EXTRACTION W/PHACO Left 12/21/2016   Procedure: CATARACT EXTRACTION PHACO AND INTRAOCULAR LENS PLACEMENT ([place])  left eye;  Surgeon: Leandrew Koyanagi, MD;  Location: [place];  Service: Ophthalmology;  Laterality: Left;  Diabetic - oral meds  . CATARACT EXTRACTION W/PHACO Right  01/25/2017   Procedure: CATARACT EXTRACTION PHACO AND INTRAOCULAR LENS PLACEMENT ([place])  right diabetic;  Surgeon: Leandrew Koyanagi, MD;  Location: [place];  Service: Ophthalmology;  Laterality: Right;  diabetic - oral meds  . CHOLECYSTECTOMY    . COLONOSCOPY WITH PROPOFOL N/A 10/26/2016   Procedure: COLONOSCOPY WITH PROPOFOL;  Surgeon: Manya Silvas, MD;  Location[facility] ENDOSCOPY;  Service: Endoscopy;  Laterality: N/A;  . KNEE SURGERY      Prior to Admission medications   Medication Sig Start Date End Date Taking? Authorizing Provider  doxycycline (VIBRAMYCIN) 100 MG capsule Take 1 capsule (100 mg total) by mouth 2 (two) times daily for 10 days. 02/23/21 03/05/21 Yes Lucrezia Starch, MD  Biotin w/ Vitamins C & E (HAIR/SKIN/NAILS PO) Take by mouth daily.    [provider]  Chlorpheniramine Maleate (ALLERGY RELIEF PO) Take by mouth daily.    [provider]  Cholecalciferol (VITAMIN D3 PO) Take by mouth daily.    [provider]  Levothyroxine Sodium (SYNTHROID PO) Take by mouth.    [provider]  metFORMIN (GLUCOPHAGE) 500 MG tablet Take 500 mg by mouth 2 (two) times daily with a meal.    [provider]  naproxen sodium (ANAPROX) 220 MG tablet Take 220 mg by mouth daily.    [provider]  Olmesartan-Amlodipine-HCTZ 40-10-25 MG TABS Take by mouth daily.    [provider]  potassium chloride (K-DUR) 10 MEQ tablet Take 10 mEq by mouth daily.    [provider]  sertraline (ZOLOFT) 100 MG tablet Take 100 mg by mouth daily.    [provider]  simvastatin (ZOCOR) 20 MG tablet Take 20 mg by mouth daily.    [provider]    Allergies Penicillins and Sulfa antibiotics  Family History  Problem Relation Age of Onset  . Breast cancer Neg Hx     Social History Social History   Tobacco Use  . Smoking status: Never Smoker  . Smokeless tobacco: Never Used  . Tobacco comment: social  as teenager  Substance Use Topics  . Alcohol use: No  . Drug use: No    Review of Systems  Review of Systems  Constitutional: Negative for chills and fever.  HENT: Negative for sore throat.   Eyes: Negative for pain.  Respiratory: Positive for cough. Negative for stridor.   Cardiovascular: Positive for chest pain.  Gastrointestinal: Negative for vomiting.  Genitourinary: Negative for dysuria.  Musculoskeletal: Positive for myalgias.  Skin: Negative for rash.  Neurological: Negative for seizures, loss of consciousness and headaches.  Psychiatric/Behavioral: Negative for suicidal ideas.  All other systems reviewed and are negative.     ____________________________________________   PHYSICAL EXAM:  VITAL SIGNS: ED Triage Vitals  Enc Vitals Group     BP 02/23/21 1033 (!) 167/95     Pulse Rate 02/23/21 1033 88     Resp 02/23/21 1033 18     Temp 02/23/21 1033 97.7 F (36.5 C)     Temp Source 02/23/21 1033 Oral     SpO2 02/23/21 1033 100 %     Weight 02/23/21 1030 155 lb (70.3 kg)     Height 02/23/21 1030 5\' 3"  (1.6 m)     Head Circumference --      Peak Flow --      Pain Score 02/23/21 1030 8     Pain Loc --      Pain Edu? --      Excl. in [place]? --    Vitals:   02/23/21 1222 02/23/21 1330  BP: (!) 142/61 (!) 146/72  Pulse: 80 79  Resp: 16 15  Temp:    SpO2: 100% 100%   Physical Exam Vitals and nursing note reviewed.  Constitutional:      General: She is not in acute distress.    Appearance: She is well-developed.  HENT:     Head: Normocephalic and atraumatic.     Right Ear: External ear normal.     Left Ear: External ear normal.     Nose: Nose normal.     Mouth/Throat:     Mouth: Mucous membranes are moist.  Eyes:     Conjunctiva/sclera: Conjunctivae normal.  Cardiovascular:     Rate and Rhythm: Normal rate and regular rhythm.     Heart sounds: No murmur heard.   Pulmonary:     Effort: Pulmonary effort is normal. No respiratory distress.     Breath  sounds: Normal breath sounds.  Abdominal:     Palpations: Abdomen is soft.     Tenderness: There is no abdominal tenderness.  Musculoskeletal:     Cervical back: Neck supple.     Right lower leg: No edema.     Left lower leg: No edema.  Skin:    General: Skin is warm and dry.     Capillary Refill: Capillary refill takes less than 2 seconds.  Neurological:     Mental Status: She is alert and oriented to person, place, and time.  Psychiatric:  Mood and Affect: Mood normal.      ____________________________________________   LABS (all labs ordered are listed, but only abnormal results are displayed)  Labs Reviewed  BASIC METABOLIC PANEL - Abnormal; Notable for the following components:      Result Value   Glucose, Bld 204 (*)    All other components within normal limits  RESP PANEL BY RT-PCR (FLU A&B, COVID) ARPGX2  CBC  TROPONIN I (HIGH SENSITIVITY)  TROPONIN I (HIGH SENSITIVITY)   ____________________________________________  EKG  Sinus rhythm with ventricular rate of 89, left axis deviation, incomplete right bundle branch block, otherwise unremarkable intervals and some nonspecific ST changes in anterior leads as well as PVC without other clear evidence of acute ischemia or other significant arrhythmia. ____________________________________________  RADIOLOGY  ED MD interpretation: No focal consolidation, large effusion, significant edema pneumothorax or other clear acute thoracic process.  CTA chest shows likely atypical pneumonia in the left upper lobe with evidence of aortic atherosclerosis and coronary artery disease as a lung nodule but no other clear acute intrathoracic process  Official radiology report(s): DG Chest 2 View  Result Date: 02/23/2021 CLINICAL DATA:  Left-sided chest pain and pressure radiating from midline to the shoulder since Saturday. EXAM: CHEST - 2 VIEW COMPARISON:  12/28/2015 FINDINGS: Heart size within normal limits. No pulmonary  vascular congestion. No focal airspace opacity to indicate pneumonia. Lungs are clear. Right upper quadrant surgical clips likely due to prior cholecystectomy. Mild bilateral acromioclavicular osteoarthrosis. IMPRESSION: No acute cardiopulmonary process. Electronically Signed   By: Miachel Roux M.D.   On: 02/23/2021 11:22   CT Angio Chest PE W and/or Wo Contrast  Result Date: 02/23/2021 CLINICAL DATA:  Left chest pain radiating to the shoulder for several days. EXAM: CT ANGIOGRAPHY CHEST WITH CONTRAST TECHNIQUE: Multidetector CT imaging of the chest was performed using the standard protocol during bolus administration of intravenous contrast. Multiplanar CT image reconstructions and MIPs were obtained to evaluate the vascular anatomy. CONTRAST:  15mL OMNIPAQUE IOHEXOL 350 MG/ML SOLN COMPARISON:  Chest radiograph from earlier today. FINDINGS: Cardiovascular: The study is high quality for the evaluation of pulmonary embolism. There are no filling defects in the central, lobar, segmental or subsegmental pulmonary artery branches to suggest acute pulmonary embolism. Atherosclerotic nonaneurysmal thoracic aorta. Normal caliber pulmonary arteries. Normal heart size. No significant pericardial fluid/thickening. Three-vessel coronary atherosclerosis. Mediastinum/Nodes: No discrete thyroid nodules. Unremarkable esophagus. No pathologically enlarged axillary, mediastinal or hilar lymph nodes. Lungs/Pleura: No pneumothorax. No pleural effusion. No acute consolidative airspace disease or lung masses. Mild patchy indistinct left upper lobe ground-glass opacities. Peripheral left lower lobe 0.4 cm solid pulmonary nodule (series 6/image 59). Upper abdomen: Cholecystectomy. Musculoskeletal: No aggressive appearing focal osseous lesions. Marked thoracic spondylosis. Review of the MIP images confirms the above findings. IMPRESSION: 1. No pulmonary embolism. 2. Mild patchy indistinct left upper lobe ground-glass opacities,  nonspecific, probably inflammatory or atypical infection. 3. Peripheral left lower lobe 0.4 cm solid pulmonary nodule. No follow-up needed if patient is low-risk. Non-contrast chest CT can be considered in 12 months if patient is high-risk. This recommendation follows the consensus statement: Guidelines for Management of Incidental Pulmonary Nodules Detected on CT Images: From the Fleischner Society 2017; Radiology 2017; 284:228-243. 4. Three-vessel coronary atherosclerosis. 5. Aortic Atherosclerosis (ICD10-I70.0). Electronically Signed   By: Ilona Sorrel M.D.   On: 02/23/2021 13:46    ____________________________________________   PROCEDURES  Procedure(s) performed (including Critical Care):  .1-3 Lead EKG Interpretation Performed by: Lucrezia Starch, MD Authorized by: Tamala Julian,  Ida Rogue, MD     Interpretation: normal     ECG rate assessment: normal     Rhythm: sinus rhythm     Ectopy: none     Conduction: normal       ____________________________________________   INITIAL IMPRESSION / ASSESSMENT AND PLAN / ED COURSE        Patient presents with above to history and exam for assessment of some substernal chest pain associate with some mild shortness of breath and nonproductive cough as well as myalgias that began 4 days ago after she was moving some furniture around her home.  She is not sure if he overexerted herself but is less worried about her heart today.  On arrival she is hypertensive with BP of 167/85 with otherwise stable vital signs on room air.  Differential includes but is not limited to ACS, symptomatic arrhythmia, PE, pneumonia, viral bronchitis, pericarditis, myocarditis, symptomatic anemia, metabolic derangements and MSK chest wall etiologies.  While ECG does have some nonspecific changes given nonelevated troponins x2 have a low suspicion for ACS myocarditis.  No significant arrhythmia identified.  Chest x-ray is unremarkable but given concern for possible PE CTA  obtained which shows likely left upper lobe atypical pneumonia.  No evidence of PE.  There is evidence of a lung nodule discussed with patient and recommendation for outpatient nonemergent surveillance imaging recommend coordinated by her PCP.  Also advised her that she has evidence of coronary atherosclerotic disease.  These can also be followed up by her primary care doctor.  BMP shows a glucose of 204 which I advised her to follow-up but no other significant electrolyte or metabolic derangements.  No evidence of DKA.  CBC is unremarkable and has no evidence of acute leukocytosis or anemia.  Do not believe patient is septic at this time.  She has no evidence of hypoxia.  Given stable vitals with otherwise reassuring exam work-up believe she is safe for discharge with plan for outpatient follow-up.  Covid is negative.  Discharged stable condition.  Rx written for doxycycline.  Strict return precautions advised and discussed.        ____________________________________________   FINAL CLINICAL IMPRESSION(S) / ED DIAGNOSES  Final diagnoses:  Atypical pneumonia  Lung nodule  Coronary artery disease due to calcified coronary lesion  Hyperglycemia    Medications  iohexol (OMNIPAQUE) 350 MG/ML injection 75 mL (75 mLs Intravenous Contrast Given 02/23/21 1254)     ED Discharge Orders         Ordered    doxycycline (VIBRAMYCIN) 100 MG capsule  2 times daily        02/23/21 1352           Note:  This document was prepared using Dragon voice recognition software and may include unintentional dictation errors.   Lucrezia Starch, MD 02/23/21 1355

## 2021-03-03 ENCOUNTER — Inpatient Hospital Stay: Admit: 2021-03-03 | Payer: MEDICARE | Attending: Family Medicine | Primary: Family Medicine

## 2021-03-03 DIAGNOSIS — Z1231 Encounter for screening mammogram for malignant neoplasm of breast: Secondary | ICD-10-CM

## 2021-03-03 DIAGNOSIS — Z13820 Encounter for screening for osteoporosis: Secondary | ICD-10-CM

## 2021-03-03 NOTE — Progress Notes (Signed)
Mammogram is negative.  Repeat in 1 year.

## 2021-03-03 NOTE — Progress Notes (Signed)
Bone density is low.  Continue with Fosamax.

## 2021-03-03 NOTE — Progress Notes (Signed)
Patient notified

## 2021-03-05 NOTE — Telephone Encounter (Signed)
Patient called wanting to discuss her bone density test.

## 2021-03-10 NOTE — Telephone Encounter (Signed)
Left message on answering machine that her bone density was low and to remain on Fosamax.

## 2021-07-26 MED ORDER — ATORVASTATIN CALCIUM 40 MG PO TABS
40 MG | ORAL_TABLET | Freq: Every evening | ORAL | 1 refills | Status: DC
Start: 2021-07-26 — End: 2022-03-17

## 2021-07-26 NOTE — Telephone Encounter (Signed)
Pt requesting a refill of    Atorvastatin    Send to [facility] in [place]

## 2021-07-28 ENCOUNTER — Encounter: Primary: Family Medicine

## 2021-07-28 ENCOUNTER — Encounter: Admit: 2021-07-28 | Discharge: 2021-07-28 | Payer: PRIVATE HEALTH INSURANCE | Primary: Family Medicine

## 2021-07-28 ENCOUNTER — Encounter

## 2021-07-28 LAB — COMPREHENSIVE METABOLIC PANEL
ALT: 20 U/L (ref 12–65)
AST: 10 U/L — ABNORMAL LOW (ref 15–37)
Albumin/Globulin Ratio: 0.9 — ABNORMAL LOW (ref 1.2–3.5)
Albumin: 3.8 g/dL (ref 3.2–4.6)
Alk Phosphatase: 45 U/L — ABNORMAL LOW (ref 50–136)
Anion Gap: 7 mmol/L (ref 4–13)
BUN: 13 MG/DL (ref 8–23)
CO2: 28 mmol/L (ref 21–32)
Calcium: 9.6 MG/DL (ref 8.3–10.4)
Chloride: 104 mmol/L (ref 101–110)
Creatinine: 1 MG/DL (ref 0.6–1.0)
GFR African American: >60 mL/min/{1.73_m2} (ref >60)
GFR Non-African American: 57 mL/min/{1.73_m2} — ABNORMAL LOW (ref >60)
Globulin: 4.3 g/dL — ABNORMAL HIGH (ref 2.3–3.5)
Glucose: 98 mg/dL (ref 65–100)
Potassium: 4.2 mmol/L (ref 3.5–5.1)
Sodium: 139 mmol/L (ref 136–145)
Total Bilirubin: 0.5 MG/DL (ref 0.2–1.1)
Total Protein: 8.1 g/dL (ref 6.3–8.2)

## 2021-07-28 LAB — CBC WITH AUTO DIFFERENTIAL
Absolute Eos #: 0.2 10*3/uL (ref 0.0–0.8)
Absolute Immature Granulocyte: 0 10*3/uL (ref 0.0–0.5)
Absolute Lymph #: 1.9 10*3/uL (ref 0.5–4.6)
Absolute Mono #: 0.4 10*3/uL (ref 0.1–1.3)
Basophils Absolute: 0.1 10*3/uL (ref 0.0–0.2)
Basophils: 2 % (ref 0.0–2.0)
Eosinophils %: 4 % (ref 0.5–7.8)
Hematocrit: 44.8 % (ref 35.8–46.3)
Hemoglobin: 14.2 g/dL (ref 11.7–15.4)
Immature Granulocytes: 0 % (ref 0.0–5.0)
Lymphocytes: 52 % — ABNORMAL HIGH (ref 13–44)
MCH: 30.7 PG (ref 26.1–32.9)
MCHC: 31.7 g/dL (ref 31.4–35.0)
MCV: 97 FL (ref 79.6–97.8)
MPV: 11.1 FL (ref 9.4–12.3)
Monocytes: 11 % (ref 4.0–12.0)
Platelets: 321 10*3/uL (ref 150–450)
RBC: 4.62 M/uL (ref 4.05–5.2)
RDW: 13.5 % (ref 11.9–14.6)
Seg Neutrophils: 31 % — ABNORMAL LOW (ref 43–78)
Segs Absolute: 1.2 10*3/uL — ABNORMAL LOW (ref 1.7–8.2)
WBC: 3.7 10*3/uL — ABNORMAL LOW (ref 4.3–11.1)
nRBC: 0 10*3/uL (ref 0.0–0.2)

## 2021-07-28 LAB — LIPID PANEL
Chol/HDL Ratio: 2.5
Cholesterol, Total: 174 MG/DL (ref <200)
HDL: 69 MG/DL — ABNORMAL HIGH (ref 40–60)
LDL Calculated: 87.4 MG/DL (ref <100)
Triglycerides: 88 MG/DL (ref 35–150)
VLDL Cholesterol Calculated: 17.6 MG/DL (ref 6.0–23.0)

## 2021-07-28 LAB — HEMOGLOBIN A1C
Hemoglobin A1C: 5.6 % (ref 4.8–5.6)
eAG: 114 mg/dL

## 2021-07-28 LAB — TSH: TSH, 3RD GENERATION: 5.02 u[IU]/mL — ABNORMAL HIGH (ref 0.358–3.740)

## 2021-08-04 ENCOUNTER — Encounter: Attending: Family Medicine | Primary: Family Medicine

## 2021-08-04 ENCOUNTER — Ambulatory Visit
Admit: 2021-08-04 | Discharge: 2021-08-04 | Payer: PRIVATE HEALTH INSURANCE | Attending: Family Medicine | Primary: Family Medicine

## 2021-08-04 DIAGNOSIS — E782 Mixed hyperlipidemia: Secondary | ICD-10-CM

## 2021-08-04 MED ORDER — FLUTICASONE-SALMETEROL 100-50 MCG/ACT IN AEPB
100-50 MCG/ACT | Freq: Two times a day (BID) | RESPIRATORY_TRACT | 5 refills | Status: DC
Start: 2021-08-04 — End: 2022-03-17

## 2021-08-04 MED ORDER — ALBUTEROL SULFATE HFA 108 (90 BASE) MCG/ACT IN AERS
108 (90 Base) MCG/ACT | RESPIRATORY_TRACT | 5 refills | Status: DC | PRN
Start: 2021-08-04 — End: 2022-03-17

## 2021-08-04 MED ORDER — MONTELUKAST SODIUM 10 MG PO TABS
10 MG | ORAL_TABLET | Freq: Every evening | ORAL | 5 refills | Status: AC
Start: 2021-08-04 — End: 2022-03-17

## 2021-08-04 MED ORDER — ALENDRONATE SODIUM 70 MG PO TABS
70 MG | ORAL_TABLET | ORAL | 5 refills | Status: DC
Start: 2021-08-04 — End: 2021-10-29

## 2021-08-04 MED ORDER — RIVAROXABAN 20 MG PO TABS
20 MG | ORAL_TABLET | Freq: Every day | ORAL | 5 refills | Status: AC
Start: 2021-08-04 — End: 2022-03-17

## 2021-08-04 NOTE — Progress Notes (Signed)
[facility]  Maureen Delatte L. Britta Mccreedy, M.D.  [address]  [place], [place] [address]  Ph No:  [phone]  Fax:  [phone]    CHIEF COMPLAINT:  Chief Complaint   Patient presents with    Osteoporosis     Patient is requesting a refill of her medication.    Allergies     Pt is requesting refills of her medication.    Discuss Labs          IMPRESSION/PLAN    1. Mixed hyperlipidemia  2. Stage 3a chronic kidney disease (HCC)  3. Protein C deficiency (HCC)  4. History of DVT (deep vein thrombosis)  5. Acquired hypothyroidism  6. Reactive depression  7. Mild intermittent asthma without complication      Reluctant to take medications for depression.  Will call if she changes her mind.  Continue xaralto.  Tsh is elevated.  No symptoms of hypothyroidism other thatn feeling cold.  Does not want to start medication at this time.  Recheck when she returns.  Insurance not [employer].  Will start advair 100.  Discussed controller vs rescue inhaler.  Continue albuterol as needed.    We discussed the expected course, resolution and complications of the diagnosis(es) in detail.  Medication risks, benefits, costs, interactions, and alternatives were discussed as indicated.  I advised pt to contact the office if condition worsens, changes or fails to improve as anticipated. Pt expressed understanding with the diagnosis(es) and plan.       HISTORY OF PRESENT ILLNESS:  Husband with dementia and now diagnosed with parkinson's.  Grand daughter has moved in to help out.  Pt indicates that it is ad adjustments.  Gd feels she is depressed.  Pt denies but acknowleges difficulty of her situation.  Here to review labs.  Tsh is low.  No weight chnges.  Does feel cold when others are not.  Some fatigue.  Nocp or sob.  Has asthma.  Insurance not [employer].  Using albuterol more.         REVIEW OF SYSTEMS:  Review of Systems    Review of systems is as stated above, otherwise is negative.    PHYSICAL EXAM:  Vital Signs  - BP 131/69 (Site: Left Upper Arm, Position: Sitting, Cuff Size: Small Adult)    Pulse 83    Temp 97.8 ??F (36.6 ??C) (Temporal)    Ht 5' (1.524 m)    Wt 156 lb (70.8 kg)    SpO2 100%    BMI 30.47 kg/m??      Physical Exam  Cardiovascular:      Rate and Rhythm: Normal rate and regular rhythm.   Pulmonary:      Effort: Pulmonary effort is normal.   Neurological:      General: No focal deficit present.      Mental Status: She is alert and oriented to person, place, and time.   Psychiatric:         Behavior: Behavior normal.      Comments: She does appear depressed.            May Ozment Atilano Ina, MD            Dictated using voice recognition software. Proofread, but unrecognized voice recognition errors may exist.

## 2021-09-14 ENCOUNTER — Telehealth

## 2021-09-14 NOTE — Telephone Encounter (Signed)
Pt requesting a call back

## 2021-09-14 NOTE — Telephone Encounter (Signed)
Left message to call back.

## 2021-09-15 NOTE — Telephone Encounter (Signed)
Patient states that she needs a Cologuard needs to be ordered.

## 2021-09-30 LAB — FECAL DNA COLORECTAL CANCER SCREENING (COLOGUARD)

## 2021-10-08 IMAGING — CT CT ANGIO CHEST
2 of 6 series · 18 of 46 positions shown · IV contrast (omnipaque)
Comparison: Chest radiograph from earlier today.

CLINICAL DATA: Left chest pain radiating to the shoulder for
several days.

EXAM:
CT ANGIOGRAPHY CHEST WITH CONTRAST
TECHNIQUE: Multidetector CT imaging of the chest was performed using the
standard protocol during bolus administration of intravenous
contrast. Multiplanar CT image reconstructions and MIPs were
obtained to evaluate the vascular anatomy.
CONTRAST:  75mL OMNIPAQUE IOHEXOL 350 MG/ML SOLN

[Series 5: thins · axial · 0.68mm/px · z∈[-349,-94]mm · 16 of 281 slices shown]
[im 13/281  lung]
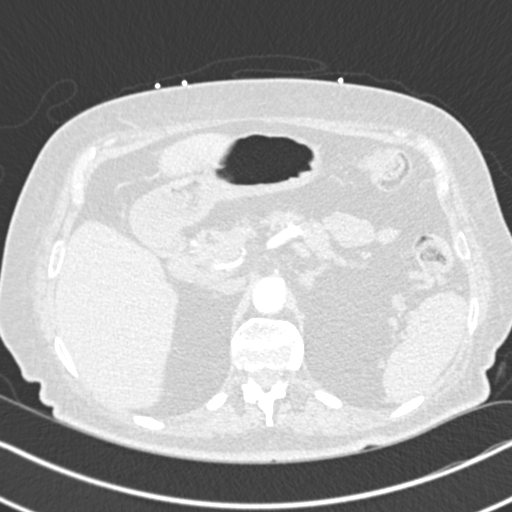
[im 37/281  soft-tissue]
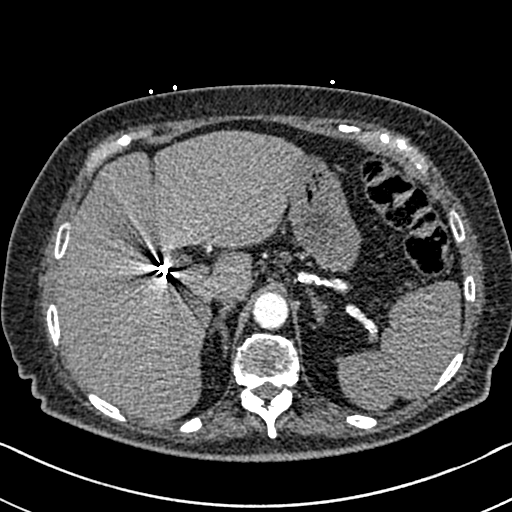
[im 49/281  lung]
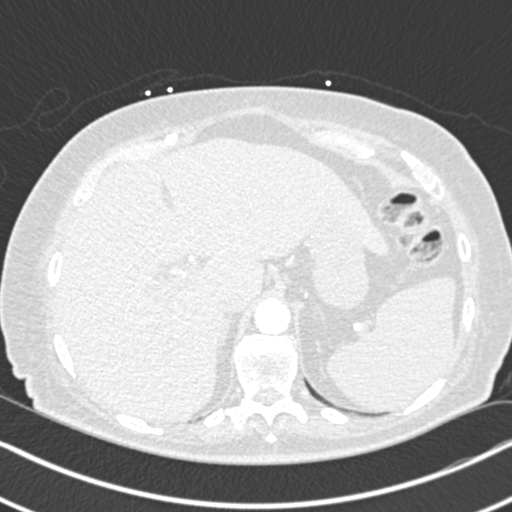
[im 61/281  soft-tissue]
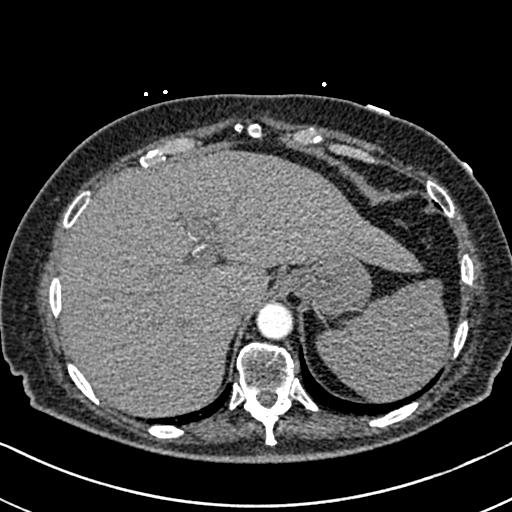
[im 86/281  lung]
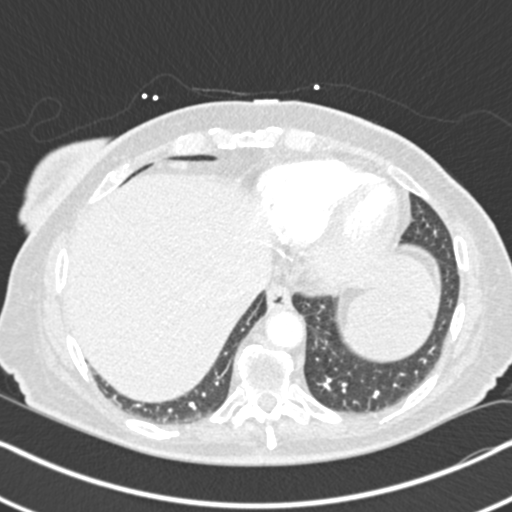
[im 98/281  soft-tissue]
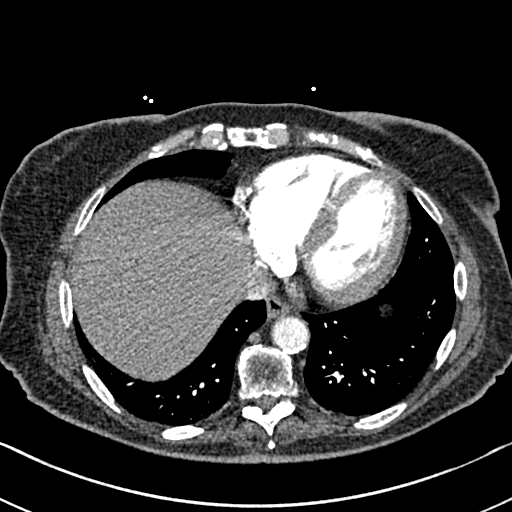
[im 110/281  lung]
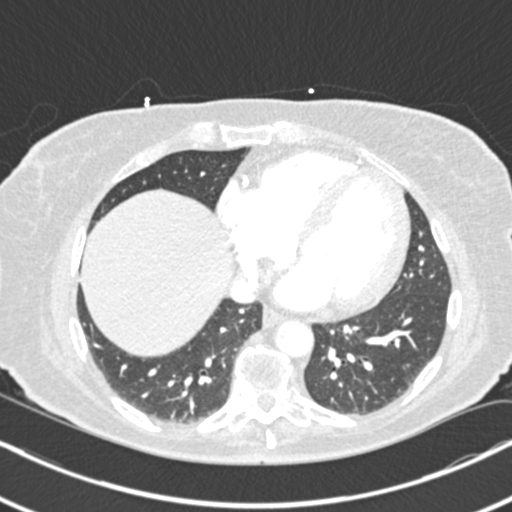
[im 134/281  soft-tissue]
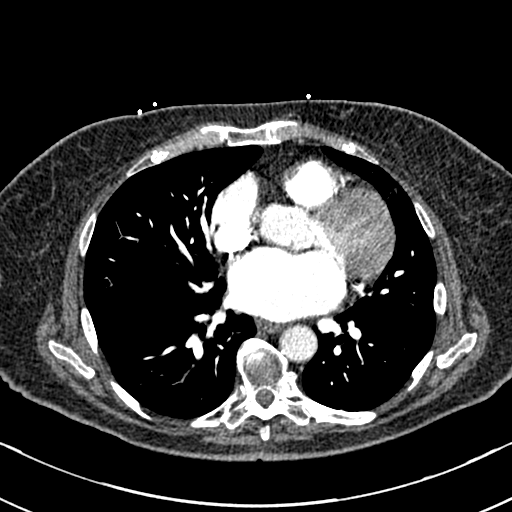
[im 147/281  lung]
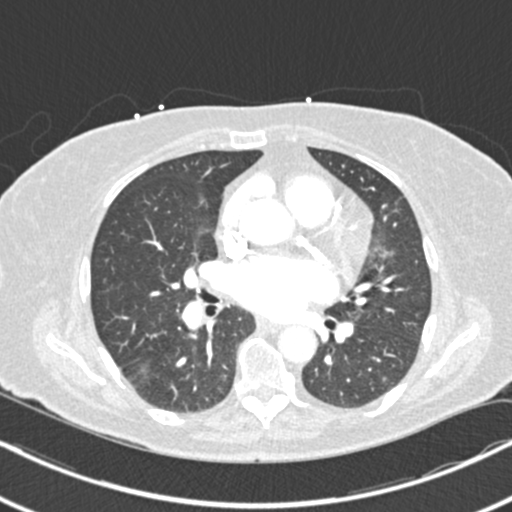
[im 171/281  soft-tissue]
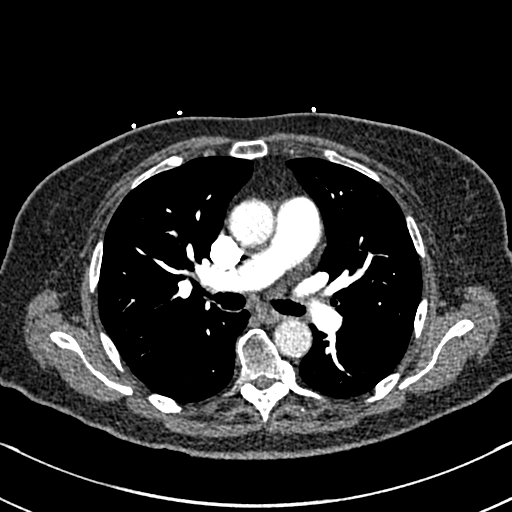
[im 183/281  lung]
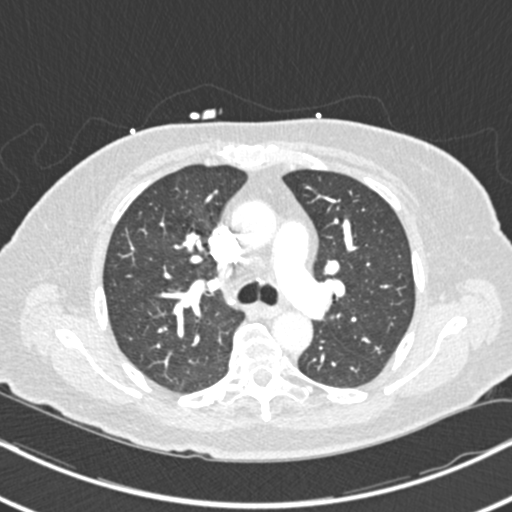
[im 195/281  soft-tissue]
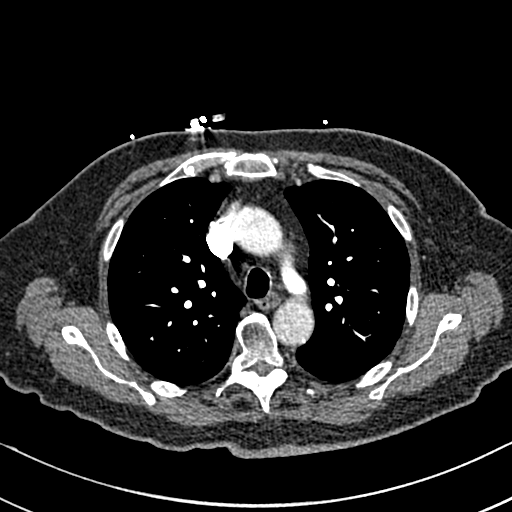
[im 220/281  lung]
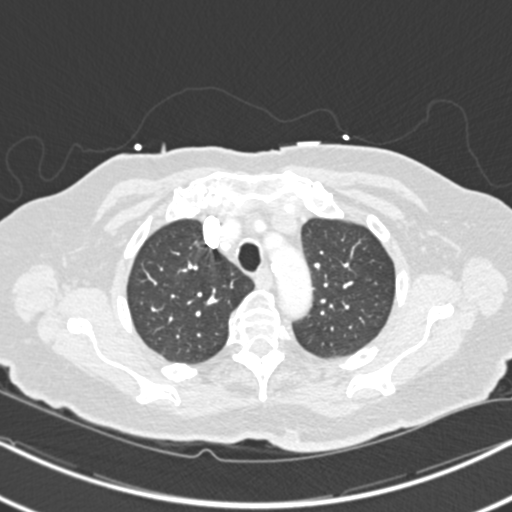
[im 232/281  soft-tissue]
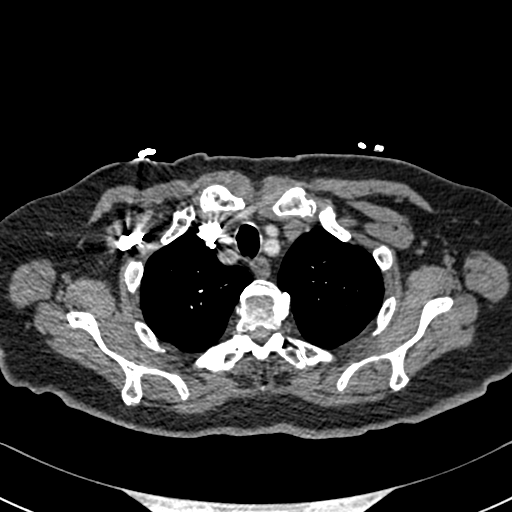
[im 244/281  lung]
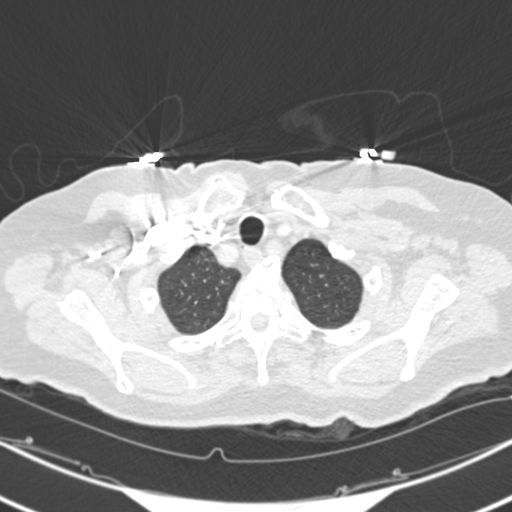
[im 268/281  soft-tissue]
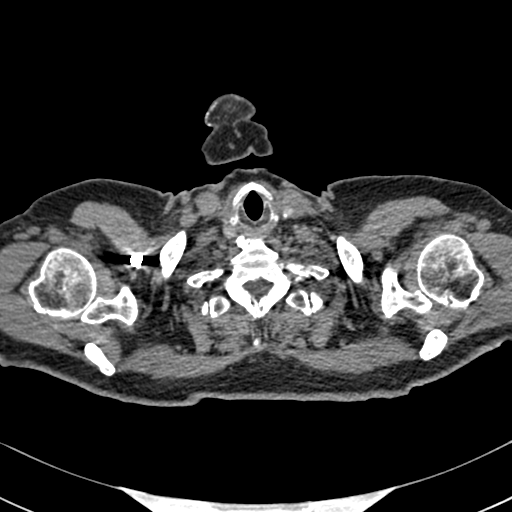

[Series 7: coronal mpr · coronal · 0.55mm/px · 2 of 83 slices shown]
[im 28/83  soft-tissue]
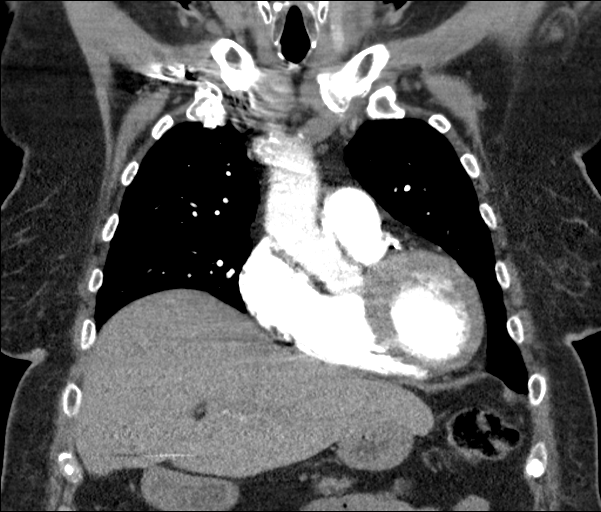
[im 55/83  soft-tissue]
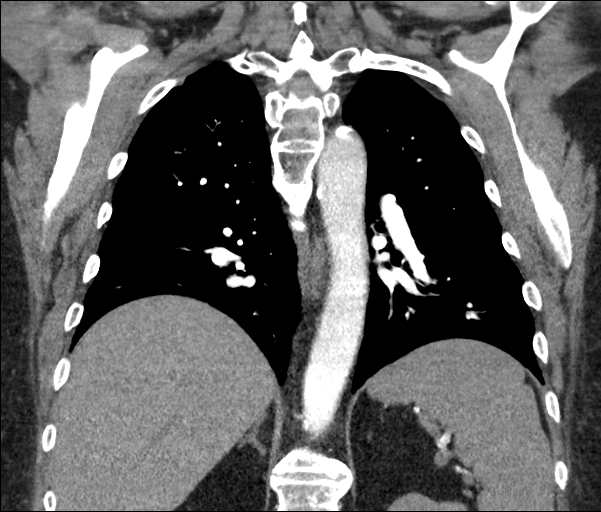

[18 of 46 positions shown; findings below may reference images not displayed]

FINDINGS: Cardiovascular: The study is high quality for the evaluation of
pulmonary embolism. There are no filling defects in the central,
lobar, segmental or subsegmental pulmonary artery branches to
suggest acute pulmonary embolism. Atherosclerotic nonaneurysmal
thoracic aorta. Normal caliber pulmonary arteries. Normal heart
size. No significant pericardial fluid/thickening. Three-vessel
coronary atherosclerosis.

Mediastinum/Nodes: No discrete thyroid nodules. Unremarkable
esophagus. No pathologically enlarged axillary, mediastinal or hilar
lymph nodes.

Lungs/Pleura: No pneumothorax. No pleural effusion. No acute
consolidative airspace disease or lung masses. Mild patchy
indistinct left upper lobe ground-glass opacities. Peripheral left
lower lobe 0.4 cm solid pulmonary nodule (series 6/image 59).

Upper abdomen: Cholecystectomy.

Musculoskeletal: No aggressive appearing focal osseous lesions.
Marked thoracic spondylosis.

Review of the MIP images confirms the above findings.
IMPRESSION: 1. No pulmonary embolism.
2. Mild patchy indistinct left upper lobe ground-glass opacities,
nonspecific, probably inflammatory or atypical infection.
3. Peripheral left lower lobe 0.4 cm solid pulmonary nodule. No
follow-up needed if patient is low-risk. Non-contrast chest CT can
be considered in 12 months if patient is high-risk. This
recommendation follows the consensus statement: Guidelines for
Management of Incidental Pulmonary Nodules Detected on CT Images:
4. Three-vessel coronary atherosclerosis.
5. Aortic Atherosclerosis ([id number]).

## 2021-10-29 MED ORDER — ALENDRONATE SODIUM 70 MG PO TABS
70 MG | ORAL_TABLET | ORAL | 5 refills | Status: DC
Start: 2021-10-29 — End: 2022-03-17

## 2022-01-13 NOTE — Telephone Encounter (Signed)
Per the pt  at her last office visit she was set up to have a color guard test. Per the pt she completed the test. Pt request a call back to discuss phone call received in regards to colonoscopy.    Advised pt that there is no referral placed by Dr. Britta Mccreedy for colonoscopy    Pt contact# [phone]

## 2022-01-16 LAB — FECAL DNA COLORECTAL CANCER SCREENING (COLOGUARD): FIT-DNA (Cologuard): NEGATIVE

## 2022-01-17 NOTE — Telephone Encounter (Signed)
Patient is aware that she does not need a Colonoscopy because her Cologuard is negative.

## 2022-02-02 ENCOUNTER — Encounter: Payer: PRIVATE HEALTH INSURANCE | Attending: Family Medicine | Primary: Family Medicine

## 2022-03-09 ENCOUNTER — Encounter: Admit: 2022-03-09 | Discharge: 2022-03-09 | Payer: PRIVATE HEALTH INSURANCE | Primary: Family Medicine

## 2022-03-09 ENCOUNTER — Encounter

## 2022-03-09 LAB — CBC WITH AUTO DIFFERENTIAL
Absolute Eos #: 0.1 10*3/uL (ref 0.0–0.8)
Absolute Immature Granulocyte: 0 10*3/uL (ref 0.0–0.5)
Absolute Lymph #: 1.9 10*3/uL (ref 0.5–4.6)
Absolute Mono #: 0.5 10*3/uL (ref 0.1–1.3)
Basophils Absolute: 0.1 10*3/uL (ref 0.0–0.2)
Basophils: 2 % (ref 0.0–2.0)
Eosinophils %: 3 % (ref 0.5–7.8)
Hematocrit: 43.4 % (ref 35.8–46.3)
Hemoglobin: 14.1 g/dL (ref 11.7–15.4)
Immature Granulocytes: 0 % (ref 0.0–5.0)
Lymphocytes: 51 % — ABNORMAL HIGH (ref 13–44)
MCH: 32.2 PG (ref 26.1–32.9)
MCHC: 32.5 g/dL (ref 31.4–35.0)
MCV: 99.1 FL (ref 82–102)
MPV: 11.3 FL (ref 9.4–12.3)
Monocytes: 13 % — ABNORMAL HIGH (ref 4.0–12.0)
Platelets: 330 10*3/uL (ref 150–450)
RBC: 4.38 M/uL (ref 4.05–5.2)
RDW: 12.9 % (ref 11.9–14.6)
Seg Neutrophils: 31 % — ABNORMAL LOW (ref 43–78)
Segs Absolute: 1.1 10*3/uL — ABNORMAL LOW (ref 1.7–8.2)
WBC: 3.6 10*3/uL — ABNORMAL LOW (ref 4.3–11.1)
nRBC: 0 10*3/uL (ref 0.0–0.2)

## 2022-03-10 LAB — LIPID PANEL
Chol/HDL Ratio: 1.9
Cholesterol, Total: 138 MG/DL (ref <200)
HDL: 74 MG/DL — ABNORMAL HIGH (ref 40–60)
LDL Calculated: 54 MG/DL (ref <100)
Triglycerides: 50 MG/DL (ref 35–150)
VLDL Cholesterol Calculated: 10 MG/DL (ref 6.0–23.0)

## 2022-03-10 LAB — COMPREHENSIVE METABOLIC PANEL
ALT: 22 U/L (ref 12–65)
AST: 15 U/L (ref 15–37)
Albumin/Globulin Ratio: 1 (ref 0.4–1.6)
Albumin: 4 g/dL (ref 3.2–4.6)
Alk Phosphatase: 45 U/L — ABNORMAL LOW (ref 50–136)
Anion Gap: 5 mmol/L (ref 2–11)
BUN: 15 MG/DL (ref 8–23)
CO2: 27 mmol/L (ref 21–32)
Calcium: 9.5 MG/DL (ref 8.3–10.4)
Chloride: 107 mmol/L (ref 101–110)
Creatinine: 1 MG/DL (ref 0.6–1.0)
Est, Glom Filt Rate: 59 mL/min/{1.73_m2} — ABNORMAL LOW (ref >60)
Globulin: 4.1 g/dL (ref 2.8–4.5)
Glucose: 101 mg/dL — ABNORMAL HIGH (ref 65–100)
Potassium: 4.7 mmol/L (ref 3.5–5.1)
Sodium: 139 mmol/L (ref 133–143)
Total Bilirubin: 0.5 MG/DL (ref 0.2–1.1)
Total Protein: 8.1 g/dL (ref 6.3–8.2)

## 2022-03-10 LAB — HEMOGLOBIN A1C
Hemoglobin A1C: 5.4 % (ref 4.8–5.6)
eAG: 108 mg/dL

## 2022-03-10 LAB — TSH: TSH, 3RD GENERATION: 3.3 u[IU]/mL (ref 0.358–3.740)

## 2022-03-17 ENCOUNTER — Ambulatory Visit
Admit: 2022-03-17 | Discharge: 2022-03-17 | Payer: PRIVATE HEALTH INSURANCE | Attending: Family Medicine | Primary: Family Medicine

## 2022-03-17 DIAGNOSIS — Z0000 Encounter for general adult medical examination without abnormal findings: Secondary | ICD-10-CM

## 2022-03-17 MED ORDER — ALBUTEROL SULFATE HFA 108 (90 BASE) MCG/ACT IN AERS
108 (90 Base) MCG/ACT | RESPIRATORY_TRACT | 5 refills | Status: DC | PRN
Start: 2022-03-17 — End: 2022-09-19

## 2022-03-17 MED ORDER — ALENDRONATE SODIUM 70 MG PO TABS
70 MG | ORAL_TABLET | ORAL | 5 refills | Status: AC
Start: 2022-03-17 — End: 2022-09-19

## 2022-03-17 MED ORDER — RIVAROXABAN 20 MG PO TABS
20 MG | ORAL_TABLET | Freq: Every day | ORAL | 1 refills | Status: DC
Start: 2022-03-17 — End: 2022-09-19

## 2022-03-17 MED ORDER — ATORVASTATIN CALCIUM 40 MG PO TABS
40 MG | ORAL_TABLET | Freq: Every evening | ORAL | 1 refills | Status: DC
Start: 2022-03-17 — End: 2022-09-19

## 2022-03-17 MED ORDER — FLUTICASONE-SALMETEROL 100-50 MCG/ACT IN AEPB
100-50 MCG/ACT | Freq: Two times a day (BID) | RESPIRATORY_TRACT | 5 refills | Status: AC
Start: 2022-03-17 — End: 2022-09-19

## 2022-03-17 MED ORDER — MONTELUKAST SODIUM 10 MG PO TABS
10 MG | ORAL_TABLET | Freq: Every evening | ORAL | 1 refills | Status: AC
Start: 2022-03-17 — End: ?

## 2022-03-17 NOTE — Assessment & Plan Note (Signed)
Well-controlled, continue current medications, medication adherence emphasized and lifestyle modifications recommended

## 2022-03-17 NOTE — Patient Instructions (Signed)
Learning About Being Active as an Older Adult  Why is being active important as you get older?     Being active is one of the best things you can do for your health. And it's never too late to start. Being active--or getting active, if you aren't already--has definite benefits. It can:  Give you more energy,  Keep your mind sharp.  Improve balance to reduce your risk of falls.  Help you manage chronic illness with fewer medicines.  No matter how old you are, how fit you are, or what health problems you have, there is a form of activity that will work for you. And the more physical activity you can do, the better your overall health will be.  What kinds of activity can help you stay healthy?  Being more active will make your daily activities easier. Physical activity includes planned exercise and things you do in daily life. There are four types of activity:  Aerobic.  Doing aerobic activity makes your heart and lungs strong.  Includes walking, dancing, and gardening.  Aim for at least 2 hours spread throughout the week.  It improves your energy and can help you sleep better.  Muscle-strengthening.  This type of activity can help maintain muscle and strengthen bones.  Includes climbing stairs, using resistance bands, and lifting or carrying heavy loads.  Aim for at least twice a week.  It can help protect the knees and other joints.  Stretching.  Stretching gives you better range of motion in joints and muscles.  Includes upper arm stretches, calf stretches, and gentle yoga.  Aim for at least twice a week, preferably after your muscles are warmed up from other activities.  It can help you function better in daily life.  Balancing.  This helps you stay coordinated and have good posture.  Includes heel-to-toe walking, tai chi, and certain types of yoga.  Aim for at least 3 days a week.  It can reduce your risk of falling.  Even if you have a hard time meeting the recommendations, it's better to be more active  than less active. All activity done in each category counts toward your weekly total. You'd be surprised how daily things like carrying groceries, keeping up with grandchildren, and taking the stairs can add up.  What keeps you from being active?  If you've had a hard time being more active, you're not alone. Maybe you remember being able to do more. Or maybe you've never thought of yourself as being active. It's frustrating when you can't do the things you want. Being more active can help. What's holding you back?  Getting started.  Have a goal, but break it into easy tasks. Small steps build into big accomplishments.  Staying motivated.  If you feel like skipping your activity, remember your goal. Maybe you want to move better and stay independent. Every activity gets you one step closer.  Not feeling your best.  Start with 5 minutes of an activity you enjoy. Prove to yourself you can do it. As you get comfortable, increase your time.  You may not be where you want to be. But you're in the process of getting there. Everyone starts somewhere.  How can you find safe ways to stay active?  Talk with your doctor about any physical challenges you're facing. Make a plan with your doctor if you have a health problem or aren't sure how to get started with activity.  If you're already active, ask your doctor if  there is anything you should change to stay safe as your body and health change.  If you tend to feel dizzy after you take medicine, avoid activity at that time. Try being active before you take your medicine. This will reduce your risk of falls.  If you plan to be active at home, make sure to clear your space before you get started. Remove things like TV cords, coffee tables, and throw rugs. It's safest to have plenty of space to move freely.  The key to getting more active is to take it slow and steady. Try to improve only a little bit at a time. Pick just one area to improve on at first. And if an activity hurts,  stop and talk to your doctor.  Where can you learn more?  Go to https://www.bennett.info/ and enter P600 to learn more about "Learning About Being Active as an Older Adult."  Current as of: October 10, 2022Content Version: 13.6   2006-2023 [employer].   Care instructions adapted under license by [facility]. If you have questions about a medical condition or this instruction, always ask your healthcare professional. [place] any warranty or liability for your use of this information.           Learning About Activities of Daily Living  What are activities of daily living?     Activities of daily living (ADLs) are the basic self-care tasks you do every day. As you age, and if you have health problems, you may find that it's harder to do these things for yourself. That's when you may need some help.  Your doctor uses ADLs to measure how much help you need. Knowing what you can and can't do for yourself is an important first step to getting help. And when you have the help you need, you can stay as independent as possible.  Your doctor will want to know if you are able to do tasks such as:  Take a bath or shower without help.  Go to the bathroom by yourself.  Dress and undress without help.  Shave, comb your hair, and brush teeth on your own.  Get in and out of bed or a chair without help.  Feed yourself without help.  If you are having trouble doing basic self-care tasks, talk with your doctor. You may want to bring a caregiver or family member who can help the doctor understand your needs and abilities.  How will a doctor assess your ADLs?  Asking about ADLs is part of a routine health checkup your doctor will likely do as you age. Your health check might be done in a doctor's office, in your home, or at a hospital. The goal is to find out if you are having any problems that could make your health problems worse or that make it unsafe for you to be  on your own.  To measure your ADLs, your doctor will ask how hard it is for you to do routine tasks. He or she may also want to know if you have changed the way you do a task because of a health problem. He or she may watch how you:  Walk back and forth.  Keep your balance while you stand or walk.  Move from sitting to standing or from a bed to a chair.  Button or unbutton a Printmaker.  Remove and put on your shoes.  It's normal to feel a little worried or anxious if you find  you can't do all the things you used to be able to do. Talking with your doctor about ADLs isn't a test that you either pass or fail. It's just a way to get more information about your health and safety.  Follow-up care is a key part of your treatment and safety. Be sure to make and go to all appointments, and call your doctor if you are having problems. It's also a good idea to know your test results and keep a list of the medicines you take.  Current as of: June 6, 2022Content Version: 13.6   2006-2023 Health[employer], Inc[employer].   Care instructions adapted under license by [facility]. If you have questions about a medical condition or this instruction, always ask your healthcare professional. [place] any warranty or liability for your use of this information.           Advance Directives: Care Instructions  Overview  An advance directive is a legal way to state your wishes at the end of your life. It tells your family and your doctor what to do if you can't say what you want.  There are two main types of advance directives. You can change them any time your wishes change.  Living will.  This form tells your family and your doctor your wishes about life support and other treatment. The form is also called a declaration.  Medical power of attorney.  This form lets you name a person to make treatment decisions for you when you can't speak for yourself. This person is called a health care agent  (health care proxy, health care surrogate). The form is also called a durable power of attorney for health care.  If you do not have an advance directive, decisions about your medical care may be made by a family member, or by a doctor or a judge who doesn't know you.  It may help to think of an advance directive as a gift to the people who care for you. If you have one, they won't have to make tough decisions by themselves.  For more information, including forms for your state, see the [place] website (RebankingSpace.hu).  Follow-up care is a key part of your treatment and safety. Be sure to make and go to all appointments, and call your doctor if you are having problems. It's also a good idea to know your test results and keep a list of the medicines you take.  What should you include in an advance directive?  Many states have a unique advance directive form. (It may ask you to address specific issues.) Or you might use a universal form that's approved by many states.  If your form doesn't tell you what to address, it may be hard to know what to include in your advance directive. Use the questions below to help you get started.  Who do you want to make decisions about your medical care if you are not able to?  What life-support measures do you want if you have a serious illness that gets worse over time or can't be cured?  What are you most afraid of that might happen? (Maybe you're afraid of having pain, losing your independence, or being kept alive by machines.)  Where would you prefer to die? (Your home? A hospital? A nursing home?)  Do you want to donate your organs when you die?  Do you want certain religious practices performed before you die?  When should you call for help?  Be  sure to contact your doctor if you have any questions.  Where can you learn more?  Go to https://www.bennett.info/ and enter R264 to learn more about "Advance Directives: Care  Instructions."  Current as of: June 16, 2022Content Version: 13.6   2006-2023 Healthwise, Incorporated.   Care instructions adapted under license by [facility]. If you have questions about a medical condition or this instruction, always ask your healthcare professional. [place] any warranty or liability for your use of this information.           Starting a Weight Loss Plan: Care Instructions  Overview     If you're thinking about losing weight, it can be hard to know where to start. Your doctor can help you set up a weight loss plan that best meets your needs. You may want to take a class on nutrition or exercise, or you could join a weight loss support group. If you have questions about how to make changes to your eating or exercise habits, ask your doctor about seeing a registered dietitian or an exercise specialist.  It can be a big challenge to lose weight. But you don't have to make huge changes at once. Make small changes, and stick with them. When those changes become habit, add a few more changes.  If you don't think you're ready to make changes right now, try to pick a date in the future. Make an appointment to see your doctor to discuss whether the time is right for you to start a plan.  Follow-up care is a key part of your treatment and safety. Be sure to make and go to all appointments, and call your doctor if you are having problems. It's also a good idea to know your test results and keep a list of the medicines you take.  How can you care for yourself at home?  Set realistic goals. Many people expect to lose much more weight than is likely. A weight loss of 5% to 10% of your body weight may be enough to improve your health.  Get family and friends involved to provide support. Talk to them about why you are trying to lose weight, and ask them to help. They can help by participating in exercise and having meals with you, even if they may be eating something  different.  Find what works best for you. If you do not have time or do not like to cook, a program that offers meal replacement bars or shakes may be better for you. Or if you like to prepare meals, finding a plan that includes daily menus and recipes may be best.  Ask your doctor about other health professionals who can help you achieve your weight loss goals.  A dietitian can help you make healthy changes in your diet.  An exercise specialist or personal trainer can help you develop a safe and effective exercise program.  A counselor or psychiatrist can help you cope with issues such as depression, anxiety, or family problems that can make it hard to focus on weight loss.  Consider joining a support group for people who are trying to lose weight. Your doctor can suggest groups in your area.  Where can you learn more?  Go to https://www.bennett.info/ and enter U357 to learn more about "Starting a Weight Loss Plan: Care Instructions."  Current as of: May 9, 2022Content Version: 13.6   2006-2023 Healthwise, Incorporated.   Care instructions adapted under license by [facility]. If you have questions  about a medical condition or this instruction, always ask your healthcare professional. [place] any warranty or liability for your use of this information.           A Healthy Heart: Care Instructions  Your Care Instructions     Coronary artery disease, also called heart disease, occurs when a substance called plaque builds up in the vessels that supply oxygen-rich blood to your heart muscle. This can narrow the blood vessels and reduce blood flow. A heart attack happens when blood flow is completely blocked. A high-fat diet, smoking, and other factors increase the risk of heart disease.  Your doctor has found that you have a chance of having heart disease. You can do lots of things to keep your heart healthy. It may not be easy, but you can change your diet,  exercise more, and quit smoking. These steps really work to lower your chance of heart disease.  Follow-up care is a key part of your treatment and safety. Be sure to make and go to all appointments, and call your doctor if you are having problems. It's also a good idea to know your test results and keep a list of the medicines you take.  How can you care for yourself at home?  Diet   Use less salt when you cook and eat. This helps lower your blood pressure. Taste food before salting. Add only a little salt when you think you need it. With time, your taste buds will adjust to less salt.    Eat fewer snack items, fast foods, canned soups, and other high-salt, high-fat, processed foods.    Read food labels and try to avoid saturated and trans fats. They increase your risk of heart disease by raising cholesterol levels.    Limit the amount of solid fat-butter, margarine, and shortening-you eat. Use olive, peanut, or canola oil when you cook. Bake, broil, and steam foods instead of frying them.    Eat a variety of fruit and vegetables every day. Dark green, deep orange, red, or yellow fruits and vegetables are especially good for you. Examples include spinach, carrots, peaches, and berries.    Foods high in fiber can reduce your cholesterol and provide important vitamins and minerals. High-fiber foods include whole-grain cereals and breads, oatmeal, beans, brown rice, citrus fruits, and apples.    Eat lean proteins. Heart-healthy proteins include seafood, lean meats and poultry, eggs, beans, peas, nuts, seeds, and soy products.    Limit drinks and foods with added sugar. These include candy, desserts, and soda pop.   Lifestyle changes   If your doctor recommends it, get more exercise. Walking is a good choice. Bit by bit, increase the amount you walk every day. Try for at least 30 minutes on most days of the week. You also may want to swim, bike, or do other activities.    Do not smoke. If you need help  quitting, talk to your doctor about stop-smoking programs and medicines. These can increase your chances of quitting for good. Quitting smoking may be the most important step you can take to protect your heart. It is never too late to quit.    Limit alcohol to 2 drinks a day for men and 1 drink a day for women. Too much alcohol can cause health problems.    Manage other health problems such as diabetes, high blood pressure, and high cholesterol. If you think you may have a problem with alcohol or drug use, talk to  your doctor.   Medicines   Take your medicines exactly as prescribed. Call your doctor if you think you are having a problem with your medicine.    If your doctor recommends aspirin, take the amount directed each day. Make sure you take aspirin and not another kind of pain reliever, such as acetaminophen (Tylenol).   When should you call for help?   Call 911 if you have symptoms of a heart attack. These may include:   Chest pain or pressure, or a strange feeling in the chest.    Sweating.    Shortness of breath.    Pain, pressure, or a strange feeling in the back, neck, jaw, or upper belly or in one or both shoulders or arms.    Lightheadedness or sudden weakness.    A fast or irregular heartbeat.   After you call 911, the operator may tell you to chew 1 adult-strength or 2 to 4 low-dose aspirin. Wait for an ambulance. Do not try to drive yourself.  Watch closely for changes in your health, and be sure to contact your doctor if you have any problems.  Where can you learn more?  Go to https://www.bennett.info/ and enter F075 to learn more about "A Healthy Heart: Care Instructions."  Current as of: September 7, 2022Content Version: 13.6   2006-2023 Healthwise, Incorporated.   Care instructions adapted under license by [facility]. If you have questions about a medical condition or this instruction, always ask your healthcare professional. [place] any warranty or liability for your use of this information.      Personalized Preventive Plan for TASHONDA PINKUS - 03/17/2022  Medicare offers a range of preventive health benefits. Some of the tests and screenings are paid in full while other may be subject to a deductible, co-insurance, and/or copay.    Some of these benefits include a comprehensive review of your medical history including lifestyle, illnesses that may run in your family, and various assessments and screenings as appropriate.    After reviewing your medical record and screening and assessments performed today your provider may have ordered immunizations, labs, imaging, and/or referrals for you.  A list of these orders (if applicable) as well as your Preventive Care list are included within your After Visit Summary for your review.    Other Preventive Recommendations:    A preventive eye exam performed by an eye specialist is recommended every 1-2 years to screen for glaucoma; cataracts, macular degeneration, and other eye disorders.  A preventive dental visit is recommended every 6 months.  Try to get at least 150 minutes of exercise per week or 10,000 steps per day on a pedometer .  Order or download the FREE "Exercise & Physical Activity: Your Everyday Guide" from [employer]. Call [phone] or search [employer] online.  You need 1200-1500 mg of calcium and 1000-2000 IU of vitamin D per day. It is possible to meet your calcium requirement with diet alone, but a vitamin D supplement is usually necessary to meet this goal.  When exposed to the sun, use a sunscreen that protects against both UVA and UVB radiation with an SPF of 30 or greater. Reapply every 2 to 3 hours or after sweating, drying off with a towel, or swimming.  Always wear a seat belt when traveling in a car. Always wear a helmet when riding a bicycle or motorcycle.

## 2022-03-17 NOTE — Progress Notes (Signed)
Medicare Annual Wellness Visit    Adylene Dlugosz Mohl is here for Medicare AWV, Leg Swelling (Patient presents with bilateral swelling for a couple of weeks. Purplish in color also), and Discuss Labs    Assessment & Plan   Medicare annual wellness visit, subsequent  Impaired fasting glucose  Protein C deficiency (HCC)  Stage 3a chronic kidney disease (HCC)  Mixed hyperlipidemia  Assessment & Plan:   Well-controlled, continue current medications, medication adherence emphasized and lifestyle modifications recommended  Mild intermittent asthma without complication  Assessment & Plan:   Well-controlled, continue current medications, medication adherence emphasized and lifestyle modifications recommended      We reviewed her labs.  Labs are all fairly stable.  Her daughter was concerned about some swelling in her legs.  She currently has no swelling.  I did advise her to wear some compression stockings during the day.  Elevate her legs whenever sitting.  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  She remains on Xarelto.  Continue with current medications as prescribed.  We will see her back here in 6 months or as needed.    Recommendations for Preventive Services Due: see orders and patient instructions/AVS.  Recommended screening schedule for the next 5-10 years is provided to the patient in written form: see Patient Instructions/AVS.     Return in about 6 months (around 09/17/22) for labs 1 week prior to visit.     Subjective   The following acute and/or chronic problems were also addressed today:  Patient here today for follow-up and AWV.  She is accompanied by her daughter.  She states that she has been doing well.  She has not been as active as she is caring for her husband who has some medical issues.  She denies any chest pain or shortness of breath.  She denies fever chills nausea vomiting or diarrhea.  She has no concerns about her memory.  She has good appetite.  She had labs done and is here to review  those.    Patient's complete Health Risk Assessment and screening values have been reviewed and are found in Flowsheets. The following problems were reviewed today and where indicated follow up appointments were made and/or referrals ordered.    Positive Risk Factor Screenings with Interventions:                 Weight and Activity:  Physical Activity: Inactive    Days of Exercise per Week: 0 days    Minutes of Exercise per Session: 0 min     On average, how many days per week do you engage in moderate to strenuous exercise (like a brisk walk)?: 0 days  Have you lost any weight without trying in the past 3 months?: No  Body mass index is 31.44 kg/m. (!) Abnormal      Inactivity Interventions:  Patient declined any further interventions or treatment  Obesity Interventions:  Patient declines any further evaluation or treatment             Safety:  Do you have either shower bars, grab bars, non-slip mats or non-slip surfaces in your shower or bathtub?: (!) No  Interventions:  Patient advised to follow up in the office for further evaluation and treatment    ADL's:   Patient reports needing help with:  Select all that apply: [employer], Pharmacologist, Stage manager, Banking/Finances  Interventions:  Patient advised to follow up in the office for further evaluation and treatment    Advanced Directives:  Do you have a Living Will?: (!) No    Intervention:  has NO advanced directive - information provided                       Objective   Vitals:    03/17/22 1100   BP: 130/69   Site: Left Upper Arm   Position: Sitting   Cuff Size: Large Adult   Pulse: 79   Temp: 98.1 F (36.7 C)   TempSrc: Temporal   SpO2: 98%   Weight: 161 lb (73 kg)   Height: 5' (1.524 m)      Body mass index is 31.44 kg/m.         Physical Exam  Cardiovascular:      Rate and Rhythm: Normal rate and regular rhythm.   Pulmonary:      Effort: Pulmonary effort is normal.   Musculoskeletal:         General: Normal range of motion.      Right lower leg:  No edema.      Left lower leg: No edema.   Neurological:      General: No focal deficit present.      Mental Status: She is alert and oriented to person, place, and time.   Psychiatric:         Behavior: Behavior normal.      Comments: She does appear depressed at times             Allergies   Allergen Reactions    Codeine Other (See Comments)     Prior to Visit Medications    Medication Sig Taking? Authorizing Provider   fluticasone-salmeterol (ADVAIR DISKUS) 100-50 MCG/ACT AEPB diskus inhaler Inhale 1 puff into the lungs in the morning and 1 puff in the evening. Yes Darik Massing Atilano Ina, MD   montelukast (SINGULAIR) 10 MG tablet Take 1 tablet by mouth nightly TAKE 1 TABLET BY MOUTH DAILY Yes Dondrea Clendenin Atilano Ina, MD   albuterol sulfate HFA (PROVENTIL;VENTOLIN;PROAIR) 108 (90 Base) MCG/ACT inhaler Inhale 2 puffs into the lungs every 4 hours as needed for Shortness of Breath Yes Trevionne Advani Atilano Ina, MD   rivaroxaban (XARELTO) 20 MG TABS tablet Take 1 tablet by mouth daily (with breakfast) TAKE 1 TABLET BY MOUTH DAILY WITH DINNER Yes Chelsee Hosie Atilano Ina, MD   atorvastatin (LIPITOR) 40 MG tablet Take 1 tablet by mouth every evening Yes Khyan Oats Atilano Ina, MD   alendronate (FOSAMAX) 70 MG tablet Take 1 tablet by mouth every 7 days Yes Edmundo Tedesco Atilano Ina, MD   budesonide (PULMICORT) 90 MCG/ACT AEPB inhaler Inhale 2 puffs into the lungs 2 times daily Yes Ar Automatic Reconciliation   calcium carb-cholecalciferol 250-125 MG-UNIT TABS Take 1 tablet by mouth daily Yes Ar Automatic Reconciliation       CareTeam (Including outside providers/suppliers regularly involved in providing care):   Patient Care Team:  Miles Costain, MD as PCP - General  Alireza Pollack Atilano Ina, MD as PCP - Empaneled Provider     Reviewed and updated this visit:  Tobacco  Allergies  Meds  Problems  Med Hx  Surg Hx  Soc Hx  Fam Hx               Shima Compere Atilano Ina, MD

## 2022-03-25 ENCOUNTER — Encounter (HOSPITAL_COMMUNITY): Payer: Self-pay | Admitting: Radiology

## 2022-05-20 ENCOUNTER — Encounter

## 2022-05-28 ENCOUNTER — Other Ambulatory Visit: Payer: Self-pay

## 2022-05-28 ENCOUNTER — Emergency Department: Payer: Medicare Other

## 2022-05-28 ENCOUNTER — Encounter: Payer: Self-pay | Admitting: Emergency Medicine

## 2022-05-28 ENCOUNTER — Inpatient Hospital Stay
Admission: EM | Admit: 2022-05-28 | Discharge: 2022-06-01 | DRG: 392 | Disposition: A | Discharge status: Home / Self Care | Payer: Medicare Other | Attending: Surgery | Admitting: Surgery

## 2022-05-28 DIAGNOSIS — E785 Hyperlipidemia, unspecified: Secondary | ICD-10-CM | POA: Diagnosis present

## 2022-05-28 DIAGNOSIS — Z8719 Personal history of other diseases of the digestive system: Secondary | ICD-10-CM

## 2022-05-28 DIAGNOSIS — K5720 Diverticulitis of large intestine with perforation and abscess without bleeding: Principal | ICD-10-CM | POA: Diagnosis present

## 2022-05-28 DIAGNOSIS — Z79899 Other long term (current) drug therapy: Secondary | ICD-10-CM | POA: Diagnosis not present

## 2022-05-28 DIAGNOSIS — Z880 Allergy status to penicillin: Secondary | ICD-10-CM

## 2022-05-28 DIAGNOSIS — E119 Type 2 diabetes mellitus without complications: Secondary | ICD-10-CM | POA: Diagnosis present

## 2022-05-28 DIAGNOSIS — K5732 Diverticulitis of large intestine without perforation or abscess without bleeding: Secondary | ICD-10-CM | POA: Diagnosis present

## 2022-05-28 DIAGNOSIS — I2510 Atherosclerotic heart disease of native coronary artery without angina pectoris: Secondary | ICD-10-CM | POA: Diagnosis present

## 2022-05-28 DIAGNOSIS — E876 Hypokalemia: Secondary | ICD-10-CM | POA: Diagnosis present

## 2022-05-28 DIAGNOSIS — Z882 Allergy status to sulfonamides status: Secondary | ICD-10-CM

## 2022-05-28 DIAGNOSIS — Z79890 Hormone replacement therapy (postmenopausal): Secondary | ICD-10-CM

## 2022-05-28 DIAGNOSIS — I10 Essential (primary) hypertension: Secondary | ICD-10-CM | POA: Diagnosis present

## 2022-05-28 DIAGNOSIS — K8689 Other specified diseases of pancreas: Secondary | ICD-10-CM | POA: Diagnosis present

## 2022-05-28 DIAGNOSIS — E039 Hypothyroidism, unspecified: Secondary | ICD-10-CM | POA: Diagnosis present

## 2022-05-28 DIAGNOSIS — I700 Atherosclerosis of aorta: Secondary | ICD-10-CM | POA: Diagnosis present

## 2022-05-28 DIAGNOSIS — R630 Anorexia: Secondary | ICD-10-CM | POA: Diagnosis present

## 2022-05-28 DIAGNOSIS — K589 Irritable bowel syndrome without diarrhea: Secondary | ICD-10-CM | POA: Diagnosis present

## 2022-05-28 DIAGNOSIS — Z7984 Long term (current) use of oral hypoglycemic drugs: Secondary | ICD-10-CM | POA: Diagnosis not present

## 2022-05-28 DIAGNOSIS — F32A Depression, unspecified: Secondary | ICD-10-CM | POA: Diagnosis present

## 2022-05-28 DIAGNOSIS — K5792 Diverticulitis of intestine, part unspecified, without perforation or abscess without bleeding: Principal | ICD-10-CM

## 2022-05-28 LAB — URINALYSIS, ROUTINE W REFLEX MICROSCOPIC
Glucose, UA: 50 mg/dL — AB
Hgb urine dipstick: NEGATIVE
Ketones, ur: 5 mg/dL — AB
Nitrite: NEGATIVE
Protein, ur: 100 mg/dL — AB
Specific Gravity, Urine: 1.029 (ref 1.005–1.030)
WBC, UA: NONE SEEN WBC/hpf (ref 0–5)
pH: 5 (ref 5.0–8.0)

## 2022-05-28 LAB — LIPASE, BLOOD: Lipase: 25 U/L (ref 11–51)

## 2022-05-28 LAB — COMPREHENSIVE METABOLIC PANEL
ALT: 16 U/L (ref 0–44)
AST: 24 U/L (ref 15–41)
Albumin: 3.1 g/dL — ABNORMAL LOW (ref 3.5–5.0)
Alkaline Phosphatase: 67 U/L (ref 38–126)
Anion gap: 12 (ref 5–15)
BUN: 24 mg/dL — ABNORMAL HIGH (ref 8–23)
CO2: 23 mmol/L (ref 22–32)
Calcium: 8.7 mg/dL — ABNORMAL LOW (ref 8.9–10.3)
Chloride: 98 mmol/L (ref 98–111)
Creatinine, Ser: 0.72 mg/dL (ref 0.44–1.00)
GFR, Estimated: >60 mL/min (ref >60)
Glucose, Bld: 179 mg/dL — ABNORMAL HIGH (ref 70–99)
Potassium: 3.3 mmol/L — ABNORMAL LOW (ref 3.5–5.1)
Sodium: 133 mmol/L — ABNORMAL LOW (ref 135–145)
Total Bilirubin: 1 mg/dL (ref 0.3–1.2)
Total Protein: 7.3 g/dL (ref 6.5–8.1)

## 2022-05-28 LAB — CBC
HCT: 36.2 % (ref 36.0–46.0)
Hemoglobin: 11.3 g/dL — ABNORMAL LOW (ref 12.0–15.0)
MCH: 27.4 pg (ref 26.0–34.0)
MCHC: 31.2 g/dL (ref 30.0–36.0)
MCV: 87.7 fL (ref 80.0–100.0)
Platelets: 280 10*3/uL (ref 150–400)
RBC: 4.13 MIL/uL (ref 3.87–5.11)
RDW: 11.9 % (ref 11.5–15.5)
WBC: 17.6 10*3/uL — ABNORMAL HIGH (ref 4.0–10.5)
nRBC: 0 % (ref 0.0–0.2)

## 2022-05-28 MED ORDER — SIMVASTATIN 20 MG PO TABS
20.0000 mg | ORAL_TABLET | Freq: Every day | ORAL | Status: DC
  Administered 2022-05-29 – 2022-06-01 (×4): 20 mg via ORAL
  Filled 2022-05-28 (×4): qty 1

## 2022-05-28 MED ORDER — SODIUM CHLORIDE 0.9 % IV SOLN: INTRAVENOUS | Status: DC

## 2022-05-28 MED ORDER — ONDANSETRON HCL 4 MG/2ML IJ SOLN: 4.0000 mg | Freq: Four times a day (QID) | INTRAMUSCULAR | Status: DC | PRN

## 2022-05-28 MED ORDER — VENLAFAXINE HCL ER 37.5 MG PO CP24
37.5000 mg | ORAL_CAPSULE | Freq: Every day | ORAL | Status: DC
  Administered 2022-05-29 – 2022-05-31 (×3): 37.5 mg via ORAL
  Filled 2022-05-28 (×5): qty 1

## 2022-05-28 MED ORDER — DOCUSATE SODIUM 100 MG PO CAPS: 100.0000 mg | ORAL_CAPSULE | Freq: Two times a day (BID) | ORAL | Status: DC | PRN

## 2022-05-28 MED ORDER — IRBESARTAN 150 MG PO TABS
300.0000 mg | ORAL_TABLET | Freq: Every day | ORAL | Status: DC
  Administered 2022-05-29 – 2022-06-01 (×4): 300 mg via ORAL
  Filled 2022-05-28 (×4): qty 2

## 2022-05-28 MED ORDER — IOHEXOL 300 MG/ML  SOLN
100.0000 mL | Freq: Once | INTRAMUSCULAR | Status: AC | PRN
  Administered 2022-05-28: 100 mL via INTRAVENOUS

## 2022-05-28 MED ORDER — METOPROLOL SUCCINATE ER 50 MG PO TB24
50.0000 mg | ORAL_TABLET | Freq: Every day | ORAL | Status: DC
  Administered 2022-05-29 – 2022-06-01 (×4): 50 mg via ORAL
  Filled 2022-05-28 (×4): qty 1

## 2022-05-28 MED ORDER — HYDROCODONE-ACETAMINOPHEN 5-325 MG PO TABS
1.0000 | ORAL_TABLET | ORAL | Status: DC | PRN
  Administered 2022-05-28 – 2022-05-30 (×3): 2 via ORAL
  Administered 2022-05-30 (×2): 1 via ORAL
  Filled 2022-05-28: qty 2
  Filled 2022-05-28 (×2): qty 1
  Filled 2022-05-28 (×2): qty 2

## 2022-05-28 MED ORDER — METRONIDAZOLE 500 MG/100ML IV SOLN
500.0000 mg | Freq: Two times a day (BID) | INTRAVENOUS | Status: DC
  Administered 2022-05-28 – 2022-05-31 (×7): 500 mg via INTRAVENOUS
  Filled 2022-05-28 (×8): qty 100

## 2022-05-28 MED ORDER — HYDROCHLOROTHIAZIDE 25 MG PO TABS
25.0000 mg | ORAL_TABLET | Freq: Every day | ORAL | Status: DC
  Administered 2022-05-29 – 2022-06-01 (×4): 25 mg via ORAL
  Filled 2022-05-28 (×4): qty 1

## 2022-05-28 MED ORDER — TRAMADOL HCL 50 MG PO TABS: 50.0000 mg | ORAL_TABLET | Freq: Four times a day (QID) | ORAL | Status: DC | PRN

## 2022-05-28 MED ORDER — SIMETHICONE 80 MG PO CHEW: 40.0000 mg | CHEWABLE_TABLET | Freq: Four times a day (QID) | ORAL | Status: DC | PRN

## 2022-05-28 MED ORDER — PANTOPRAZOLE SODIUM 40 MG IV SOLR
40.0000 mg | Freq: Every day | INTRAVENOUS | Status: DC
  Administered 2022-05-28 – 2022-05-31 (×4): 40 mg via INTRAVENOUS
  Filled 2022-05-28 (×4): qty 10

## 2022-05-28 MED ORDER — SERTRALINE HCL 50 MG PO TABS
100.0000 mg | ORAL_TABLET | Freq: Every day | ORAL | Status: DC
Start: 2022-05-29 — End: 2022-05-28

## 2022-05-28 MED ORDER — SODIUM CHLORIDE 0.9 % IV BOLUS
1000.0000 mL | Freq: Once | INTRAVENOUS | Status: AC
  Administered 2022-05-28: 1000 mL via INTRAVENOUS

## 2022-05-28 MED ORDER — CIPROFLOXACIN IN D5W 400 MG/200ML IV SOLN
400.0000 mg | Freq: Two times a day (BID) | INTRAVENOUS | Status: DC
  Administered 2022-05-28 – 2022-05-31 (×7): 400 mg via INTRAVENOUS
  Filled 2022-05-28 (×8): qty 200

## 2022-05-28 MED ORDER — ONDANSETRON 4 MG PO TBDP: 4.0000 mg | ORAL_TABLET | Freq: Four times a day (QID) | ORAL | Status: DC | PRN

## 2022-05-28 MED ORDER — AMLODIPINE BESYLATE 10 MG PO TABS
10.0000 mg | ORAL_TABLET | Freq: Every day | ORAL | Status: DC
  Administered 2022-05-29 – 2022-06-01 (×4): 10 mg via ORAL
  Filled 2022-05-28 (×4): qty 1

## 2022-05-28 MED ORDER — CIPROFLOXACIN IN D5W 400 MG/200ML IV SOLN
400.0000 mg | Freq: Once | INTRAVENOUS | Status: AC
  Administered 2022-05-28: 400 mg via INTRAVENOUS
  Filled 2022-05-28: qty 200

## 2022-05-28 MED ORDER — MORPHINE SULFATE (PF) 2 MG/ML IV SOLN
2.0000 mg | INTRAVENOUS | Status: DC | PRN
  Administered 2022-05-29 (×2): 2 mg via INTRAVENOUS
  Filled 2022-05-28 (×2): qty 1

## 2022-05-28 MED ORDER — METRONIDAZOLE 500 MG/100ML IV SOLN
500.0000 mg | Freq: Once | INTRAVENOUS | Status: AC
  Administered 2022-05-28: 500 mg via INTRAVENOUS
  Filled 2022-05-28: qty 100

## 2022-05-28 MED ORDER — ONDANSETRON HCL 4 MG/2ML IJ SOLN
4.0000 mg | Freq: Once | INTRAMUSCULAR | Status: AC
  Administered 2022-05-28: 4 mg via INTRAVENOUS
  Filled 2022-05-28: qty 2

## 2022-05-28 MED ORDER — TIZANIDINE HCL 4 MG PO TABS: 4.0000 mg | ORAL_TABLET | Freq: Three times a day (TID) | ORAL | Status: DC | PRN

## 2022-05-28 MED ORDER — OLMESARTAN-AMLODIPINE-HCTZ 40-10-25 MG PO TABS: ORAL_TABLET | Freq: Every day | ORAL | Status: DC

## 2022-05-28 MED ORDER — GENTAMICIN SULFATE 40 MG/ML IJ SOLN
5.0000 mg/kg | Freq: Once | INTRAVENOUS | Status: AC
  Administered 2022-05-28: 280 mg via INTRAVENOUS
  Filled 2022-05-28: qty 7

## 2022-05-28 MED ORDER — LEVOTHYROXINE SODIUM 50 MCG PO TABS
150.0000 ug | ORAL_TABLET | Freq: Every day | ORAL | Status: DC
  Administered 2022-05-30 – 2022-06-01 (×3): 150 ug via ORAL
  Filled 2022-05-28 (×4): qty 1

## 2022-05-28 NOTE — ED Notes (Signed)
Patient to CT at this time

## 2022-05-28 NOTE — Plan of Care (Signed)
  Problem: Clinical Measurements: Goal: Ability to maintain clinical measurements within normal limits will improve Outcome: Progressing Goal: Will remain free from infection Outcome: Progressing Goal: Diagnostic test results will improve Outcome: Progressing Goal: Respiratory complications will improve Outcome: Progressing Goal: Cardiovascular complication will be avoided Outcome: Progressing   Problem: Activity: Goal: Risk for activity intolerance will decrease Outcome: Progressing   Problem: Pain Managment: Goal: General experience of comfort will improve Outcome: Progressing

## 2022-05-28 NOTE — ED Triage Notes (Signed)
Pt via POV from home. Pt c/o lower and L-sided abd pain and diarrhea since Wednesday night. Denies any vomiting states that she has not had any appetite. Pt has hx of a hysterectomy, appendectomy, and cholecystectomy. Pt is A&OX4 and NAD

## 2022-05-28 NOTE — Consult Note (Signed)
Pharmacy Antibiotic Note  Sarah [name] is a 76 y.o. female admitted on 05/28/2022 with  intra-abdominal infection .  Pharmacy has been consulted for gentamicin dosing.  Plan: Gentamicin 280 mg IVPB x 1 dose ('5mg'$ /kg) extended interval.  CrCl 53.39m /min ABW 56.8 kg  Will order random Gentamicin level 8-12hrs after dose given to determine proper dose interval.  Height: '5\' 3"'$  (160 cm) Weight: 63.3 kg (139 lb 8.8 oz) IBW/kg (Calculated) : 52.4  Temp (24hrs), Avg:98.4 F (36.9 C), Min:98 F (36.7 C), Max:98.7 F (37.1 C)  Recent Labs  Lab 05/28/22 1242  WBC 17.6*  CREATININE 0.72    Estimated Creatinine Clearance: 53.6 mL/min (by C-G formula based on SCr of 0.72 mg/dL).    Allergies  Allergen Reactions   Penicillins Anaphylaxis   Sulfa Antibiotics Rash    Antimicrobials this admission: 7/1 Ciprofloxacin >>  7/1 Metronidazole >>  7/1 Gentamicin >>  Thank you for allowing pharmacy to be a part of this patient's care.  Tamura Lasky Rodriguez-[name] PharmD, BCPS 05/28/2022 9:16 PM

## 2022-05-28 NOTE — H&P (Signed)
Subjective:   CC: diverticulitis  HPI:  Sarah [name] is a 76 y.o. female who was consulted by Archie Balboa for issue above.  Symptoms were first noted 2 weeks ago. Pain is sharp, confined to the left lower quadrant, without radiation.  Associated with anorexia and diarrhea, exacerbated by nothing specific.    Been a while since last colonoscopy, has hx of polyps   Past Medical History:  has a past medical history of Arthritis, Cancer ([place]), Diabetes mellitus without complication ([place]), Hyperlipidemia, Hypertension, Hypothyroidism, and IBS (irritable bowel syndrome).  Past Surgical History:  Past Surgical History:  Procedure Laterality Date   ABDOMINAL HYSTERECTOMY     CATARACT EXTRACTION W/PHACO Left 12/21/2016   Procedure: CATARACT EXTRACTION PHACO AND INTRAOCULAR LENS PLACEMENT ([place])  left eye;  Surgeon: Leandrew Koyanagi, MD;  Location: [facility];  Service: Ophthalmology;  Laterality: Left;  Diabetic - oral meds   CATARACT EXTRACTION W/PHACO Right 01/25/2017   Procedure: CATARACT EXTRACTION PHACO AND INTRAOCULAR LENS PLACEMENT ([place])  right diabetic;  Surgeon: Leandrew Koyanagi, MD;  Location: [facility];  Service: Ophthalmology;  Laterality: Right;  diabetic - oral meds   CHOLECYSTECTOMY     COLONOSCOPY WITH PROPOFOL N/A 10/26/2016   Procedure: COLONOSCOPY WITH PROPOFOL;  Surgeon: Manya Silvas, MD;  Location: [facility];  Service: Endoscopy;  Laterality: N/A;   KNEE SURGERY      Family History: reviewed and not relevant to CC  Social History:  reports that she has never smoked. She has never used smokeless tobacco. She reports that she does not drink alcohol and does not use drugs.  Current Medications:  Prior to Admission medications   Medication Sig Start Date End Date Taking? Authorizing Provider  Biotin w/ Vitamins C & E (HAIR/SKIN/NAILS PO) Take by mouth daily.    [provider]  Chlorpheniramine Maleate (ALLERGY RELIEF PO) Take by mouth  daily.    [provider]  Cholecalciferol (VITAMIN D3 PO) Take by mouth daily.    [provider]  Levothyroxine Sodium (SYNTHROID PO) Take by mouth.    [provider]  metFORMIN (GLUCOPHAGE) 500 MG tablet Take 500 mg by mouth 2 (two) times daily with a meal.    [provider]  naproxen sodium (ANAPROX) 220 MG tablet Take 220 mg by mouth daily.    [provider]  Olmesartan-Amlodipine-HCTZ 40-10-25 MG TABS Take by mouth daily.    [provider]  potassium chloride (K-DUR) 10 MEQ tablet Take 10 mEq by mouth daily.    [provider]  sertraline (ZOLOFT) 100 MG tablet Take 100 mg by mouth daily.    [provider]  simvastatin (ZOCOR) 20 MG tablet Take 20 mg by mouth daily.    [provider]    Allergies:  Allergies as of 05/28/2022 - Review Complete 05/28/2022  Allergen Reaction Noted   Penicillins Anaphylaxis 06/21/2015   Sulfa antibiotics Rash 10/25/2016    ROS:  General: Denies weight loss, weight gain, fatigue, fevers, chills, and night sweats. Eyes: Denies blurry vision, double vision, eye pain, itchy eyes, and tearing. Ears: Denies hearing loss, earache, and ringing in ears. Nose: Denies sinus pain, congestion, infections, runny nose, and nosebleeds. Mouth/throat: Denies hoarseness, sore throat, bleeding gums, and difficulty swallowing. Heart: Denies chest pain, palpitations, racing heart, irregular heartbeat, leg pain or swelling, and decreased activity tolerance. Respiratory: Denies breathing difficulty, shortness of breath, wheezing, cough, and sputum. GI: Denies change in appetite, heartburn, nausea, vomiting, constipation, and blood in stool.  GU: Denies difficulty urinating, pain with urinating, urgency, frequency, blood in urine. Musculoskeletal: Denies joint stiffness, pain, swelling, muscle weakness. Skin: Denies rash, itching, mass, tumors, sores, and boils Neurologic: Denies headache,  fainting, dizziness, seizures, numbness, and tingling. Psychiatric: Denies depression, anxiety, difficulty sleeping, and memory loss. Endocrine: Denies heat or cold intolerance, and increased thirst or urination. Blood/lymph: Denies easy bruising, easy bruising, and swollen glands     Objective:     BP 113/63   Pulse (!) 103   Temp 98 F (36.7 C) (Oral)   Resp 10   Ht '5\' 3"'$  (1.6 m)   Wt 62.6 kg   SpO2 96%   BMI 24.45 kg/m   Constitutional :  alert, cooperative, appears stated age, and no distress  Lymphatics/Throat:  no asymmetry, masses, or scars  Respiratory:  clear to auscultation bilaterally  Cardiovascular:  regular rate and rhythm  Gastrointestinal: Soft, no guarding, focal TTP LLQ and suprapubic region .   Musculoskeletal: Steady movement  Skin: Cool and moist, RUQ surgical scar   Psychiatric: Normal affect, non-agitated, not confused       LABS:     Latest Ref Rng & Units 05/28/2022   12:42 PM 02/23/2021   10:33 AM 06/21/2015    5:02 PM  CMP  Glucose 70 - 99 mg/dL 179  204  110   BUN 8 - 23 mg/dL '24  15  13   '$ Creatinine 0.44 - 1.00 mg/dL 0.72  0.64  0.56   Sodium 135 - 145 mmol/L 133  136  137   Potassium 3.5 - 5.1 mmol/L 3.3  3.5  3.4   Chloride 98 - 111 mmol/L 98  100  100   CO2 22 - 32 mmol/L '23  25  27   '$ Calcium 8.9 - 10.3 mg/dL 8.7  9.3  9.5   Total Protein 6.5 - 8.1 g/dL 7.3   7.5   Total Bilirubin 0.3 - 1.2 mg/dL 1.0   1.0   Alkaline Phos 38 - 126 U/L 67   46   AST 15 - 41 U/L 24   42   ALT 0 - 44 U/L 16   45       Latest Ref Rng & Units 05/28/2022   12:42 PM 02/23/2021   10:33 AM 06/21/2015    5:02 PM  CBC  WBC 4.0 - 10.5 K/uL 17.6  4.1  4.9   Hemoglobin 12.0 - 15.0 g/dL 11.3  13.7  13.7   Hematocrit 36.0 - 46.0 % 36.2  40.0  39.5   Platelets 150 - 400 K/uL 280  160  183     RADS: CLINICAL DATA:  Pt c/o lower and L-sided abd pain and diarrhea since Wednesday night. Denies any vomiting states that she has not had any appetite. Pt has hx of a  hysterectomy, appendectomy, and cholecystectomy.   EXAM: CT ABDOMEN AND PELVIS WITH CONTRAST   TECHNIQUE: Multidetector CT imaging of the abdomen and pelvis was performed using the standard protocol following bolus administration of intravenous contrast.   RADIATION DOSE REDUCTION: This exam was performed according to the departmental dose-optimization program which includes automated exposure control, adjustment of the mA and/or kV according to patient size and/or use of iterative reconstruction technique.   CONTRAST:  151m OMNIPAQUE IOHEXOL 300 MG/ML  SOLN   COMPARISON:  09/20/2010   FINDINGS: Lower chest: Lung bases essentially clear. Three-vessel coronary artery calcifications.   Hepatobiliary: Possible liver fatty infiltration. Liver normal in overall size. No mass  or focal lesion. Status post cholecystectomy. No bile duct dilation.   Pancreas: Pancreatic atrophy. No mass. No inflammation or duct dilation.   Spleen: Lobulated spleen, stable. Spleen mildly enlarged to 13 cm. No splenic mass.   Adrenals/Urinary Tract: Normal adrenal glands. Kidneys normal in size, orientation and position with symmetric enhancement. Small nonobstructing stone in the posterior midpole of the right kidney. No renal masses or other stones. No hydronephrosis. Normal ureters. Normal bladder.   Stomach/Bowel: Sigmoid colon with wall thickening along its midportion with adjacent inflammation. There is also an adjacent collection containing air in fluid, lying between the sigmoid colon and left pelvic sidewall, measuring 6.1 x 3.7 x 5.9 cm. 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   No other colonic wall thickening or inflammation. No evidence of bowel obstruction. Stomach and small bowel are normal in caliber. No wall thickening or inflammation.   Vascular/Lymphatic: Aortic atherosclerosis. No aneurysm. No enlarged lymph nodes.   Reproductive: Status post hysterectomy. No adnexal  masses.   Other: No ascites.   Musculoskeletal: No fracture or acute finding.  No bone lesion.   IMPRESSION: 1. Acute complicated sigmoid colon diverticulitis. There is a 6.1 cm peridiverticular collection consistent with an abscess. No free intraperitoneal air. 2. No other acute abnormality within the abdomen or pelvis. 3. Aortic atherosclerosis.     Electronically Signed   By: Lajean Manes M.D.   On: 05/28/2022 16:29   Assessment:   Diverticulitis with perforation, associated abscess DM HTN HLD Depression hypothyroidism  Plan:   IR for possible percutaneous drainage.  IV abx, IVF, pain control in the meantime, CLD.  Continue home meds as well for above.  Hold lovenox for possible procedure Protonix for GI prophylaxis  The patient verbalized understanding and all questions were answered to the patient's satisfaction.  labs/images/medications/previous chart entries reviewed personally and relevant changes/updates noted above.

## 2022-05-28 NOTE — ED Provider Notes (Signed)
[facility] Provider Note    Event Date/Time   First MD Initiated Contact with Patient 05/28/22 1501     (approximate)   History   Abdominal Pain and Diarrhea   HPI  Sarah [name] is a 76 y.o. female  who, per internal medicine note dated 03/30/22 has history of DM, who presents to the emergency department today because of concern for abdominal pain and diarrhea. The patient states that she has been having issues with her stomach for the past 2-3 months. She however has been busy taking care of her husband who had a stroke so has not been able to see anyone about her issues. She says she has three areas in her stomach that are painful, the epigastric, llq and suprapubic regions. The pain has gotten worse and more constant over the past couple of months. This has been accompanied by non bloody diarrhea. Has had nausea but no vomiting, has had deceased appetite. No fevers.    Physical Exam   Triage Vital Signs: ED Triage Vitals  Enc Vitals Group     BP 05/28/22 1241 (!) 116/44     Pulse Rate 05/28/22 1241 68     Resp 05/28/22 1241 20     Temp --      Temp src --      SpO2 05/28/22 1241 96 %     Weight 05/28/22 1239 138 lb (62.6 kg)     Height 05/28/22 1239 '5\' 3"'$  (1.6 m)     Head Circumference --      Peak Flow --      Pain Score 05/28/22 1239 10   Most recent vital signs: Vitals:   05/28/22 1241  BP: (!) 116/44  Pulse: 68  Resp: 20  SpO2: 96%   General: Awake, alert, oriented. CV:  Good peripheral perfusion. Regular rate and rhythm. Resp:  Normal effort. Lungs clear to auscultation. Abd:  No distention. Non tender.   ED Results / Procedures / Treatments   Labs (all labs ordered are listed, but only abnormal results are displayed) Labs Reviewed  COMPREHENSIVE METABOLIC PANEL - Abnormal; Notable for the following components:      Result Value   Sodium 133 (*)    Potassium 3.3 (*)    Glucose, Bld 179 (*)    BUN 24 (*)    Calcium 8.7 (*)     Albumin 3.1 (*)    All other components within normal limits  CBC - Abnormal; Notable for the following components:   WBC 17.6 (*)    Hemoglobin 11.3 (*)    All other components within normal limits  LIPASE, BLOOD  URINALYSIS, ROUTINE W REFLEX MICROSCOPIC     EKG  I, Nance Pear, attending physician, personally viewed and interpreted this EKG  EKG Time: 1244 Rate: 110 Rhythm: sinus tachycardia Axis: left axis deviation Intervals: qtc 470 QRS: LVH ST changes: no st elevation Impression: abnormal ekg  RADIOLOGY I independently interpreted and visualized the CT abd/pel. My interpretation: No free air Radiology interpretation:  IMPRESSION:  1. Acute complicated sigmoid colon diverticulitis. There is a 6.1 cm  peridiverticular collection consistent with an abscess. No free  intraperitoneal air.  2. No other acute abnormality within the abdomen or pelvis.  3. Aortic atherosclerosis.     PROCEDURES:  Critical Care performed: No  Procedures   MEDICATIONS ORDERED IN ED: Medications - No data to display   IMPRESSION / MDM / [place] / ED COURSE  I reviewed the triage vital signs and the nursing notes.                              Differential diagnosis includes, but is not limited to, gastroenteritis, IBS, diverticulitis, mesenteric ischemia.  Patient's presentation is most consistent with acute presentation with potential threat to life or bodily function.  Patient presented to the emergency department today because of concerns for abdominal pain and diarrhea.  Blood work did show a leukocytosis.  CT scan was obtained which was consistent with acute diverticulitis with abscess.  Discussed with Dr. Lysle Pearl with general surgery who will plan on admission. Discussed findings and plan with patient.  FINAL CLINICAL IMPRESSION(S) / ED DIAGNOSES   Final diagnoses:  Diverticulitis      Note:  This document was prepared using Dragon voice recognition  software and may include unintentional dictation errors.    Nance Pear, MD 05/28/22 [phone]

## 2022-05-29 ENCOUNTER — Inpatient Hospital Stay: Payer: Medicare Other

## 2022-05-29 LAB — CBC
HCT: 32.5 % — ABNORMAL LOW (ref 36.0–46.0)
Hemoglobin: 10.2 g/dL — ABNORMAL LOW (ref 12.0–15.0)
MCH: 27.6 pg (ref 26.0–34.0)
MCHC: 31.4 g/dL (ref 30.0–36.0)
MCV: 87.8 fL (ref 80.0–100.0)
Platelets: 217 10*3/uL (ref 150–400)
RBC: 3.7 MIL/uL — ABNORMAL LOW (ref 3.87–5.11)
RDW: 11.8 % (ref 11.5–15.5)
WBC: 10.7 10*3/uL — ABNORMAL HIGH (ref 4.0–10.5)
nRBC: 0 % (ref 0.0–0.2)

## 2022-05-29 LAB — PROTIME-INR
INR: 1.1 (ref 0.8–1.2)
Prothrombin Time: 14.5 seconds (ref 11.4–15.2)

## 2022-05-29 LAB — BASIC METABOLIC PANEL
Anion gap: 8 (ref 5–15)
BUN: 12 mg/dL (ref 8–23)
CO2: 27 mmol/L (ref 22–32)
Calcium: 7.9 mg/dL — ABNORMAL LOW (ref 8.9–10.3)
Chloride: 100 mmol/L (ref 98–111)
Creatinine, Ser: 0.51 mg/dL (ref 0.44–1.00)
GFR, Estimated: >60 mL/min (ref >60)
Glucose, Bld: 170 mg/dL — ABNORMAL HIGH (ref 70–99)
Potassium: 2.8 mmol/L — ABNORMAL LOW (ref 3.5–5.1)
Sodium: 135 mmol/L (ref 135–145)

## 2022-05-29 LAB — GENTAMICIN LEVEL, RANDOM: Gentamicin Rm: 2.8 ug/mL

## 2022-05-29 MED ORDER — MIDAZOLAM HCL 2 MG/2ML IJ SOLN
INTRAMUSCULAR | Status: AC
  Filled 2022-05-29: qty 2

## 2022-05-29 MED ORDER — MIDAZOLAM HCL 5 MG/5ML IJ SOLN
INTRAMUSCULAR | Status: AC | PRN
  Administered 2022-05-29: .5 mg via INTRAVENOUS

## 2022-05-29 MED ORDER — FENTANYL CITRATE (PF) 100 MCG/2ML IJ SOLN
INTRAMUSCULAR | Status: AC
  Filled 2022-05-29: qty 2

## 2022-05-29 MED ORDER — MIDAZOLAM HCL 2 MG/2ML IJ SOLN
INTRAMUSCULAR | Status: AC | PRN
  Administered 2022-05-29: 1 mg via INTRAVENOUS

## 2022-05-29 MED ORDER — FENTANYL CITRATE (PF) 100 MCG/2ML IJ SOLN
INTRAMUSCULAR | Status: AC | PRN
  Administered 2022-05-29: 50 ug via INTRAVENOUS

## 2022-05-29 MED ORDER — GENTAMICIN SULFATE 40 MG/ML IJ SOLN
5.0000 mg/kg | INTRAVENOUS | Status: DC
  Administered 2022-05-29 – 2022-05-31 (×3): 280 mg via INTRAVENOUS
  Filled 2022-05-29 (×3): qty 7

## 2022-05-29 MED ORDER — SODIUM CHLORIDE 0.9% FLUSH
5.0000 mL | Freq: Three times a day (TID) | INTRAVENOUS | Status: DC
  Administered 2022-05-29 – 2022-06-01 (×8): 5 mL

## 2022-05-29 NOTE — Plan of Care (Signed)
  Problem: Clinical Measurements: Goal: Ability to maintain clinical measurements within normal limits will improve Outcome: Progressing Goal: Will remain free from infection Outcome: Progressing Goal: Diagnostic test results will improve Outcome: Progressing Goal: Respiratory complications will improve Outcome: Progressing Goal: Cardiovascular complication will be avoided Outcome: Progressing   Problem: Pain Managment: Goal: General experience of comfort will improve Outcome: Progressing   Problem: Skin Integrity: Goal: Risk for impaired skin integrity will decrease Outcome: Progressing   

## 2022-05-29 NOTE — H&P (Signed)
Chief Complaint: Patient was seen in consultation today for diverticular abscess  at the request of Dr. Sung Amabile  Referring Physician(s): Dr. Sung Amabile  Supervising Physician: Malachy Moan  Patient Status: [facility] - In-pt  History of Present Illness: Sarah [name] is a 76 y.o. female with past medical history of DM type II, thyroid cancer, HLD, HTN and IBS.  Patient presented to emergency department 05/28/2022 complaining of epigastric, left lower quadrant and suprapubic abdominal pain and diarrhea that has worsened over the past couple months.  CT abdomen pelvis at that time showed acute complicated sigmoid colon diverticulitis with a 6.1 cm peridiverticular collection consistent with abscess.  Patient was referred to IR by Dr. Sung Amabile for diverticular abscess drain placement.  Procedure was approved by Dr. Archer Asa, IR.  CT Abd/pelvis 05/28/22: IMPRESSION: 1. Acute complicated sigmoid colon diverticulitis. There is a 6.1 cm peridiverticular collection consistent with an abscess. No free intraperitoneal air. 2. No other acute abnormality within the abdomen or pelvis. 3. Aortic atherosclerosis.    Past Medical History:  Diagnosis Date   Arthritis    hands   Cancer (HCC)    thyroid- radiation   Diabetes mellitus without complication (HCC)    Hyperlipidemia    Hypertension    Hypothyroidism    IBS (irritable bowel syndrome)     Past Surgical History:  Procedure Laterality Date   ABDOMINAL HYSTERECTOMY     CATARACT EXTRACTION W/PHACO Left 12/21/2016   Procedure: CATARACT EXTRACTION PHACO AND INTRAOCULAR LENS PLACEMENT (IOC)  left eye;  Surgeon: Lockie Mola, MD;  Location: [facility];  Service: Ophthalmology;  Laterality: Left;  Diabetic - oral meds   CATARACT EXTRACTION W/PHACO Right 01/25/2017   Procedure: CATARACT EXTRACTION PHACO AND INTRAOCULAR LENS PLACEMENT (IOC)  right diabetic;  Surgeon: Lockie Mola, MD;  Location: [facility];  Service: Ophthalmology;  Laterality: Right;  diabetic - oral meds   CHOLECYSTECTOMY     COLONOSCOPY WITH PROPOFOL N/A 10/26/2016   Procedure: COLONOSCOPY WITH PROPOFOL;  Surgeon: Scot Jun, MD;  Location: [facility];  Service: Endoscopy;  Laterality: N/A;   KNEE SURGERY      Allergies: Penicillins and Sulfa antibiotics  Medications: Prior to Admission medications   Medication Sig Start Date End Date Taking? Authorizing Provider  Biotin w/ Vitamins C & E (HAIR/SKIN/NAILS PO) Take by mouth daily.   Yes [provider]  Chlorpheniramine Maleate (ALLERGY RELIEF PO) Take by mouth daily.   Yes [provider]  Cholecalciferol (VITAMIN D3 PO) Take by mouth daily.   Yes [provider]  glimepiride (AMARYL) 4 MG tablet Take 4 mg by mouth daily with breakfast. 09/29/21 09/29/22 Yes [provider]  levothyroxine (SYNTHROID) 150 MCG tablet Take 150 mcg by mouth daily before breakfast.   Yes [provider]  metFORMIN (GLUCOPHAGE) 500 MG tablet Take 500 mg by mouth 2 (two) times daily with a meal.   Yes [provider]  metoprolol succinate (TOPROL-XL) 50 MG 24 hr tablet Take 50 mg by mouth daily. Take with or immediately following a meal.   Yes [provider]  naproxen sodium (ANAPROX) 220 MG tablet Take 220 mg by mouth daily.   Yes [provider]  Olmesartan-Amlodipine-HCTZ 40-10-25 MG TABS Take by mouth daily.   Yes [provider]  potassium chloride (K-DUR) 10 MEQ tablet Take 10 mEq by mouth daily.   Yes [provider]  sertraline (ZOLOFT) 100 MG tablet Take 100 mg by mouth daily.  Yes [provider]  simvastatin (ZOCOR) 20 MG tablet Take 20 mg by mouth daily.   Yes [provider]  tiZANidine (ZANAFLEX) 4 MG capsule Take 4 mg by mouth 3 (three) times daily as needed for muscle spasms.   Yes [provider]  venlafaxine (EFFEXOR) 37.5 MG tablet Take 37.5 mg by  mouth daily.   Yes [provider]  Semaglutide (OZEMPIC, 0.25 OR 0.5 MG/DOSE, [place]) Inject 0.25 mg into the skin every 7 (seven) days.    [provider]     Family History  Problem Relation Age of Onset   Breast cancer Neg Hx     Social History   Socioeconomic History   Marital status: Married    Spouse name: Not on file   Number of children: Not on file   Years of education: Not on file   Highest education level: Not on file  Occupational History   Not on file  Tobacco Use   Smoking status: Never   Smokeless tobacco: Never   Tobacco comments:    social as teenager  Substance and Sexual Activity   Alcohol use: No   Drug use: No   Sexual activity: Not on file  Other Topics Concern   Not on file  Social History Narrative   Not on file   Social Determinants of Health   Financial Resource Strain: Not on file  Food Insecurity: Not on file  Transportation Needs: Not on file  Physical Activity: Not on file  Stress: Not on file  Social Connections: Not on file      Review of Systems: A 12 point ROS discussed and pertinent positives are indicated in the HPI above.  All other systems are negative.  Review of Systems  Vital Signs: BP 132/61 (BP Location: Right Arm)   Pulse 85   Temp 98.7 F (37.1 C)   Resp 20   Ht 5\' 3"  (1.6 m)   Wt 139 lb 8.8 oz (63.3 kg)   SpO2 94%   BMI 24.72 kg/m      Physical Exam  Imaging: CT ABDOMEN PELVIS W CONTRAST  Result Date: 05/28/2022 CLINICAL DATA:  Pt c/o lower and L-sided abd pain and diarrhea since Wednesday night. Denies any vomiting states that she has not had any appetite. Pt has hx of a hysterectomy, appendectomy, and cholecystectomy. EXAM: CT ABDOMEN AND PELVIS WITH CONTRAST TECHNIQUE: Multidetector CT imaging of the abdomen and pelvis was performed using the standard protocol following bolus administration of intravenous contrast. RADIATION DOSE REDUCTION: This exam was performed according to the  departmental dose-optimization program which includes automated exposure control, adjustment of the mA and/or kV according to patient size and/or use of iterative reconstruction technique. CONTRAST:  OMNIPAQUE IOHEXOL 300 MG/ML  SOLN COMPARISON:  09/20/2010 FINDINGS: Lower chest: Lung bases essentially clear. Three-vessel coronary artery calcifications. Hepatobiliary: Possible liver fatty infiltration. Liver normal in overall size. No mass or focal lesion. Status post cholecystectomy. No bile duct dilation. Pancreas: Pancreatic atrophy. No mass. No inflammation or duct dilation. Spleen: Lobulated spleen, stable. Spleen mildly enlarged to 13 cm. No splenic mass. Adrenals/Urinary Tract: Normal adrenal glands. Kidneys normal in size, orientation and position with symmetric enhancement. Small nonobstructing stone in the posterior midpole of the right kidney. No renal masses or other stones. No hydronephrosis. Normal ureters. Normal bladder. Stomach/Bowel: Sigmoid colon with wall thickening along its midportion with adjacent inflammation. There is also an adjacent collection containing air in fluid, lying between the sigmoid colon  and left pelvic sidewall, measuring 6.1 x 3.7 x 5.9 cm. 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 No other colonic wall thickening or inflammation. No evidence of bowel obstruction. Stomach and small bowel are normal in caliber. No wall thickening or inflammation. Vascular/Lymphatic: Aortic atherosclerosis. No aneurysm. No enlarged lymph nodes. Reproductive: Status post hysterectomy. No adnexal masses. Other: No ascites. Musculoskeletal: No fracture or acute finding.  No bone lesion. IMPRESSION: 1. Acute complicated sigmoid colon diverticulitis. There is a 6.1 cm peridiverticular collection consistent with an abscess. No free intraperitoneal air. 2. No other acute abnormality within the abdomen or pelvis. 3. Aortic atherosclerosis. Electronically Signed   By: Amie Portland M.D.   On:  05/28/2022 16:29    Labs:  CBC: Recent Labs    05/28/22 1242 05/29/22 0647  WBC 17.6* 10.7*  HGB 11.3* 10.2*  HCT 36.2 32.5*  PLT 280 217    COAGS: Recent Labs    05/29/22 0647  INR 1.1    BMP: Recent Labs    05/28/22 1242 05/29/22 0647  NA 133* 135  K 3.3* 2.8*  CL 98 100  CO2 23 27  GLUCOSE 179* 170*  BUN 24* 12  CALCIUM 8.7* 7.9*  CREATININE 0.72 0.51  GFRNONAA >60 >60    LIVER FUNCTION TESTS: Recent Labs    05/28/22 1242  BILITOT 1.0  AST 24  ALT 16  ALKPHOS 67  PROT 7.3  ALBUMIN 3.1*    TUMOR MARKERS: No results for input(s): "AFPTM", "CEA", "CA199", "CHROMGRNA" in the last 8760 hours.  Assessment and Plan: History of DM type II, thyroid cancer, HLD, HTN and IBS.  Patient presented to emergency department 05/28/2022 complaining of epigastric, left lower quadrant and suprapubic abdominal pain and diarrhea that has worsened over the past couple months.  CT abdomen pelvis at that time showed acute complicated sigmoid colon diverticulitis with a 6.1 cm peridiverticular collection consistent with abscess.  Patient was referred to IR by Dr. Sung Amabile for diverticular abscess drain placement.  Procedure was approved by Dr. Archer Asa, IR.  CT Abd/pelvis 05/28/22: IMPRESSION: 1. Acute complicated sigmoid colon diverticulitis. There is a 6.1 cm peridiverticular collection consistent with an abscess. No free intraperitoneal air. 2. No other acute abnormality within the abdomen or pelvis. 3. Aortic atherosclerosis.   Risks and benefits of intraabdominal abscess drain with moderate sedation discussed with the patient including bleeding, infection, damage to adjacent structures, bowel perforation/fistula connection, and sepsis.  All of the patient's questions were answered, patient is agreeable to proceed. Consent signed and in chart.   Thank you for this interesting consult.  I greatly enjoyed meeting Sarah [name] and look forward to participating in their  care.  A copy of this report was sent to the requesting provider on this date.  Electronically Signed: Shon Hough, NP 05/29/2022, 8:59 AM   I spent a total of in face to face in clinical consultation, greater than 50% of which was counseling/coordinating care for diverticular abscess.

## 2022-05-29 NOTE — Procedures (Signed)
Interventional Radiology Procedure Note  Procedure: CT drain placement into LLQ diverticular abscess  Complications: NONE  Estimated Blood Loss: NONE  Recommendations: - Drain to JP - Flush TID - Cultures sent  Signed,  Criselda Peaches, MD

## 2022-05-29 NOTE — Consult Note (Signed)
Pharmacy Antibiotic Note  Sarah [name] is a 76 y.o. female admitted on 05/28/2022 with  intra-abdominal infection .  Pharmacy has been consulted for gentamicin dosing. -PCN allergy:anaplyaxis  Plan: Gentamicin 280 mg IVPB x 1 dose ('5mg'$ /kg) extended interval.  CrCl 53.102m /min ABW 56.8 kg  Will order random Gentamicin level 8-12hrs after dose given to determine proper dose interval.   7/2:  Random Gentamicin level ~ 8 hrs post dose= 2.8 mcg/ml.  Per nomogram will continue with extended interval dosing of 5 mg/kg (280 mg) IV Q24h (using adj BW as >20% over ideal BW). F/u renal fxn and check trough once a week if no renal changes.     Height: '5\' 3"'$  (160 cm) Weight: 63.3 kg (139 lb 8.8 oz) IBW/kg (Calculated) : 52.4  Temp (24hrs), Avg:98.5 F (36.9 C), Min:98 F (36.7 C), Max:98.7 F (37.1 C)  Recent Labs  Lab 05/28/22 1242 05/29/22 0647  WBC 17.6* 10.7*  CREATININE 0.72 0.51  GENTRANDOM  --  2.8     Estimated Creatinine Clearance: 53.6 mL/min (by C-G formula based on SCr of 0.51 mg/dL).    Allergies  Allergen Reactions   Penicillins Anaphylaxis   Sulfa Antibiotics Rash    Antimicrobials this admission: 7/1 Ciprofloxacin >>  7/1 Metronidazole >>  7/1 Gentamicin >>  Thank you for allowing pharmacy to be a part of this patient's care.  KChinita GreenlandPharmD Clinical Pharmacist 05/29/2022

## 2022-05-30 LAB — BASIC METABOLIC PANEL
Anion gap: 9 (ref 5–15)
BUN: 10 mg/dL (ref 8–23)
CO2: 27 mmol/L (ref 22–32)
Calcium: 7.6 mg/dL — ABNORMAL LOW (ref 8.9–10.3)
Chloride: 99 mmol/L (ref 98–111)
Creatinine, Ser: 0.59 mg/dL (ref 0.44–1.00)
GFR, Estimated: >60 mL/min (ref >60)
Glucose, Bld: 186 mg/dL — ABNORMAL HIGH (ref 70–99)
Potassium: 3.2 mmol/L — ABNORMAL LOW (ref 3.5–5.1)
Sodium: 135 mmol/L (ref 135–145)

## 2022-05-30 LAB — CBC
HCT: 32.6 % — ABNORMAL LOW (ref 36.0–46.0)
Hemoglobin: 10.2 g/dL — ABNORMAL LOW (ref 12.0–15.0)
MCH: 27.1 pg (ref 26.0–34.0)
MCHC: 31.3 g/dL (ref 30.0–36.0)
MCV: 86.7 fL (ref 80.0–100.0)
Platelets: 241 10*3/uL (ref 150–400)
RBC: 3.76 MIL/uL — ABNORMAL LOW (ref 3.87–5.11)
RDW: 11.8 % (ref 11.5–15.5)
WBC: 13.4 10*3/uL — ABNORMAL HIGH (ref 4.0–10.5)
nRBC: 0 % (ref 0.0–0.2)

## 2022-05-30 LAB — GLUCOSE, CAPILLARY
Glucose-Capillary: 152 mg/dL — ABNORMAL HIGH (ref 70–99)
Glucose-Capillary: 187 mg/dL — ABNORMAL HIGH (ref 70–99)
Glucose-Capillary: 154 mg/dL — ABNORMAL HIGH (ref 70–99)
Glucose-Capillary: 154 mg/dL — ABNORMAL HIGH (ref 70–99)

## 2022-05-30 MED ORDER — METRONIDAZOLE 500 MG PO TABS: 500.0000 mg | ORAL_TABLET | Freq: Two times a day (BID) | ORAL | 0 refills | Status: AC

## 2022-05-30 MED ORDER — SODIUM CHLORIDE 0.9% FLUSH
5.0000 mL | Freq: Three times a day (TID) | INTRAVENOUS | 0 refills | Status: AC
Start: 2022-05-30 — End: 2022-08-28

## 2022-05-30 MED ORDER — ENOXAPARIN SODIUM 40 MG/0.4ML IJ SOSY
40.0000 mg | PREFILLED_SYRINGE | INTRAMUSCULAR | Status: DC
  Administered 2022-05-31: 40 mg via SUBCUTANEOUS
  Filled 2022-05-30 (×2): qty 0.4

## 2022-05-30 MED ORDER — CIPROFLOXACIN HCL 500 MG PO TABS: 500.0000 mg | ORAL_TABLET | Freq: Two times a day (BID) | ORAL | 0 refills | Status: AC

## 2022-05-30 MED ORDER — INSULIN ASPART 100 UNIT/ML IJ SOLN
0.0000 [IU] | Freq: Three times a day (TID) | INTRAMUSCULAR | Status: DC
  Administered 2022-05-30 – 2022-05-31 (×5): 3 [IU] via SUBCUTANEOUS
  Administered 2022-06-01: 2 [IU] via SUBCUTANEOUS
  Filled 2022-05-30 (×6): qty 1

## 2022-05-30 NOTE — Discharge Instructions (Signed)
Drain care per radiology office.  Call their office with questions or concerns.  Continue flushing drain three times a day as instructed by nurses.  Call surgery office for additional questions or concerns.

## 2022-05-30 NOTE — TOC Initial Note (Signed)
Transition of Care [facility]) - Initial/Assessment Note    Patient Details  Name: Sarah [name] MRN: [id number] Date of Birth: 28-Jan-1946  Transition of Care [facility]) CM/SW Contact:    Beverly Sessions, RN Phone Number: 05/30/2022, 1:36 PM  Clinical Narrative:                   Transition of Care [facility]) Screening Note   Patient Details  Name: Sarah [name] Date of Birth: 1946-04-13   Transition of Care [facility]) CM/SW Contact:    Beverly Sessions, RN Phone Number: 05/30/2022, 1:36 PM    Transition of Care Department [facility]) has reviewed patient and no TOC needs have been identified at this time. We will continue to monitor patient advancement through interdisciplinary progression rounds. If new patient transition needs arise, please place a TOC consult.  Per rounds no TOC needs at discharge. Patient will be educated on how to manage drain at home        Patient Goals and CMS Choice        Expected Discharge Plan and Services                                                Prior Living Arrangements/Services                       Activities of Daily Living Home Assistive Devices/Equipment: None ADL Screening (condition at time of admission) Patient's cognitive ability adequate to safely complete daily activities?: Yes Is the patient deaf or have difficulty hearing?: No Does the patient have difficulty seeing, even when wearing glasses/contacts?: No Does the patient have difficulty concentrating, remembering, or making decisions?: No Patient able to express need for assistance with ADLs?: Yes Does the patient have difficulty dressing or bathing?: No Independently performs ADLs?: Yes (appropriate for developmental age) Does the patient have difficulty walking or climbing stairs?: No Weakness of Legs: None Weakness of Arms/Hands: None  Permission Sought/Granted                  Emotional Assessment              Admission diagnosis:   Diverticulitis [K57.92] Diverticulitis large intestine [K57.32] Patient Active Problem List   Diagnosis Date Noted   Diverticulitis large intestine 05/28/2022   PCP:  Rusty Aus, MD Pharmacy:   [facility] [address] [place])  [address] Phone: [phone] Fax: [phone]     Social Determinants of Health (SDOH) Interventions    Readmission Risk Interventions     No data to display

## 2022-05-30 NOTE — Progress Notes (Addendum)
Subjective:  CC: Sarah [name] is a 76 y.o. female  Hospital stay day 2,   diverticulitis  HPI: S/p drain placement yesterday, no appetite.  ROS:  General: Denies weight loss, weight gain, fatigue, fevers, chills, and night sweats. Heart: Denies chest pain, palpitations, racing heart, irregular heartbeat, leg pain or swelling, and decreased activity tolerance. Respiratory: Denies breathing difficulty, shortness of breath, wheezing, cough, and sputum. GI: Denies change in appetite, heartburn, nausea, vomiting, constipation, diarrhea, and blood in stool. GU: Denies difficulty urinating, pain with urinating, urgency, frequency, blood in urine.   Objective:   Temp:  [98.3 F (36.8 C)-99.1 F (37.3 C)] 98.3 F (36.8 C) (07/03 0333) Pulse Rate:  [80-90] 80 (07/03 0333) Resp:  [16-27] 16 (07/03 0333) BP: (108-147)/(55-100) 108/59 (07/03 0333) SpO2:  [92 %-98 %] 93 % (07/03 0333)     Height: '5\' 3"'$  (160 cm) Weight: 63.3 kg BMI (Calculated): 24.73   Intake/Output this shift:   Intake/Output Summary (Last 24 hours) at 05/30/2022 0739 Last data filed at 05/30/2022 0551 Gross per 24 hour  Intake 2243.54 ml  Output 80 ml  Net 2163.54 ml    Constitutional :  alert, cooperative, appears stated age, and no distress  Respiratory:  clear to auscultation bilaterally  Cardiovascular:  regular rate and rhythm  Gastrointestinal: Soft, no guarding, still has TTP around drain placement and LLQ . Drain with scant serous drainage.  Skin: Cool and moist.   Psychiatric: Normal affect, non-agitated, not confused       LABS:     Latest Ref Rng & Units 05/30/2022    3:14 AM 05/29/2022    6:47 AM 05/28/2022   12:42 PM  CMP  Glucose 70 - 99 mg/dL 186  170  179   BUN 8 - 23 mg/dL '10  12  24   '$ Creatinine 0.44 - 1.00 mg/dL 0.59  0.51  0.72   Sodium 135 - 145 mmol/L 135  135  133   Potassium 3.5 - 5.1 mmol/L 3.2  2.8  3.3   Chloride 98 - 111 mmol/L 99  100  98   CO2 22 - 32 mmol/L '27  27  23   '$ Calcium 8.9 -  10.3 mg/dL 7.6  7.9  8.7   Total Protein 6.5 - 8.1 g/dL   7.3   Total Bilirubin 0.3 - 1.2 mg/dL   1.0   Alkaline Phos 38 - 126 U/L   67   AST 15 - 41 U/L   24   ALT 0 - 44 U/L   16       Latest Ref Rng & Units 05/30/2022    3:14 AM 05/29/2022    6:47 AM 05/28/2022   12:42 PM  CBC  WBC 4.0 - 10.5 K/uL 13.4  10.7  17.6   Hemoglobin 12.0 - 15.0 g/dL 10.2  10.2  11.3   Hematocrit 36.0 - 46.0 % 32.6  32.5  36.2   Platelets 150 - 400 K/uL 241  217  280     RADS: N/a Assessment:   Diverticulitis with abcess, s/p drain placement.  Will encourage diet and make sure she tolerates prior to d/c.  Continue abx in the meantime.  Flush Rx will be placed in chart. Abx Rx sent to pharmacy  labs/images/medications/previous chart entries reviewed personally and relevant changes/updates noted above.

## 2022-05-30 NOTE — Progress Notes (Addendum)
Referring Physician(s): Benjamine Sprague, DO  Supervising Physician: Juliet Rude  Patient Status:  [facility] - In-pt  Reason for visit: Diverticular abscess  S/p successful drain placement in IR 7/2  Subjective: Patient bathing at the time of visit and therefore I was not able to see, per chart review no complaints with drain site other than tenderness as expected.   Allergies: Penicillins and Sulfa antibiotics  Medications: Prior to Admission medications   Medication Sig Start Date End Date Taking? Authorizing Provider  Biotin w/ Vitamins C & E (HAIR/SKIN/NAILS PO) Take by mouth daily.   Yes [provider]  Chlorpheniramine Maleate (ALLERGY RELIEF PO) Take by mouth daily.   Yes [provider]  Cholecalciferol (VITAMIN D3 PO) Take by mouth daily.   Yes [provider]  ciprofloxacin (CIPRO) 500 MG tablet Take 1 tablet (500 mg total) by mouth 2 (two) times daily for 7 days. 05/30/22 06/06/22 Yes Sakai, Isami, DO  glimepiride (AMARYL) 4 MG tablet Take 4 mg by mouth daily with breakfast. 09/29/21 09/29/22 Yes [provider]  levothyroxine (SYNTHROID) 150 MCG tablet Take 150 mcg by mouth daily before breakfast.   Yes [provider]  metFORMIN (GLUCOPHAGE) 500 MG tablet Take 500 mg by mouth 2 (two) times daily with a meal.   Yes [provider]  metoprolol succinate (TOPROL-XL) 50 MG 24 hr tablet Take 50 mg by mouth daily. Take with or immediately following a meal.   Yes [provider]  metroNIDAZOLE (FLAGYL) 500 MG tablet Take 1 tablet (500 mg total) by mouth 2 (two) times daily for 7 days. 05/30/22 06/06/22 Yes Sakai, Isami, DO  naproxen sodium (ANAPROX) 220 MG tablet Take 220 mg by mouth daily.   Yes [provider]  Olmesartan-Amlodipine-HCTZ 40-10-25 MG TABS Take by mouth daily.   Yes [provider]  potassium chloride (K-DUR) 10 MEQ tablet Take 10 mEq by mouth daily.   Yes [provider]   sertraline (ZOLOFT) 100 MG tablet Take 100 mg by mouth daily.   Yes [provider]  simvastatin (ZOCOR) 20 MG tablet Take 20 mg by mouth daily.   Yes [provider]  tiZANidine (ZANAFLEX) 4 MG capsule Take 4 mg by mouth 3 (three) times daily as needed for muscle spasms.   Yes [provider]  venlafaxine (EFFEXOR) 37.5 MG tablet Take 37.5 mg by mouth daily.   Yes [provider]  Semaglutide (OZEMPIC, 0.25 OR 0.5 MG/DOSE, [place]) Inject 0.25 mg into the skin every 7 (seven) days.    [provider]  sodium chloride flush (NS) 0.9 % SOLN 5 mLs by Intracatheter route every 8 (eight) hours. 05/30/22 08/28/22  Benjamine Sprague, DO   Vital Signs: BP (!) 98/51 (BP Location: Left Arm)   Pulse 74   Temp 97.7 F (36.5 C) (Oral)   Resp 16   Ht '5\' 3"'$  (1.6 m)   Wt 139 lb 8.8 oz (63.3 kg)   SpO2 96%   BMI 24.72 kg/m   Output by Drain (mL) 05/28/22 0701 - 05/28/22 1900 05/28/22 1901 - 05/29/22 0700 05/29/22 0701 - 05/29/22 1900 05/29/22 1901 - 05/30/22 0700 05/30/22 0701 - 05/30/22 1317  Closed System Drain 1 Left LLQ Bulb (JP) 12 Fr.   30 50    Imaging: CT ABDOMEN PELVIS W CONTRAST  Result Date: 05/28/2022 CLINICAL DATA:  Pt c/o lower and L-sided abd pain and diarrhea since Wednesday night. Denies any vomiting states that she has not had  any appetite. Pt has hx of a hysterectomy, appendectomy, and cholecystectomy. EXAM: CT ABDOMEN AND PELVIS WITH CONTRAST TECHNIQUE: Multidetector CT imaging of the abdomen and pelvis was performed using the standard protocol following bolus administration of intravenous contrast. RADIATION DOSE REDUCTION: This exam was performed according to the departmental dose-optimization program which includes automated exposure control, adjustment of the mA and/or kV according to patient size and/or use of iterative reconstruction technique. CONTRAST:  166m OMNIPAQUE IOHEXOL 300 MG/ML  SOLN COMPARISON:  09/20/2010 FINDINGS: Lower chest: Lung  bases essentially clear. Three-vessel coronary artery calcifications. Hepatobiliary: Possible liver fatty infiltration. Liver normal in overall size. No mass or focal lesion. Status post cholecystectomy. No bile duct dilation. Pancreas: Pancreatic atrophy. No mass. No inflammation or duct dilation. Spleen: Lobulated spleen, stable. Spleen mildly enlarged to 13 cm. No splenic mass. Adrenals/Urinary Tract: Normal adrenal glands. Kidneys normal in size, orientation and position with symmetric enhancement. Small nonobstructing stone in the posterior midpole of the right kidney. No renal masses or other stones. No hydronephrosis. Normal ureters. Normal bladder. Stomach/Bowel: Sigmoid colon with wall thickening along its midportion with adjacent inflammation. There is also an adjacent collection containing air in fluid, lying between the sigmoid colon and left pelvic sidewall, measuring 6.1 x 3.7 x 5.9 cm. 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 No other colonic wall thickening or inflammation. No evidence of bowel obstruction. Stomach and small bowel are normal in caliber. No wall thickening or inflammation. Vascular/Lymphatic: Aortic atherosclerosis. No aneurysm. No enlarged lymph nodes. Reproductive: Status post hysterectomy. No adnexal masses. Other: No ascites. Musculoskeletal: No fracture or acute finding.  No bone lesion. IMPRESSION: 1. Acute complicated sigmoid colon diverticulitis. There is a 6.1 cm peridiverticular collection consistent with an abscess. No free intraperitoneal air. 2. No other acute abnormality within the abdomen or pelvis. 3. Aortic atherosclerosis. Electronically Signed   By: DLajean ManesM.D.   On: 05/28/2022 16:29    Labs:  CBC: Recent Labs    05/28/22 1242 05/29/22 0647 05/30/22 0314  WBC 17.6* 10.7* 13.4*  HGB 11.3* 10.2* 10.2*  HCT 36.2 32.5* 32.6*  PLT 280 217 241    COAGS: Recent Labs    05/29/22 0647  INR 1.1    BMP: Recent Labs    05/28/22 1242 05/29/22 0647  05/30/22 0314  NA 133* 135 135  K 3.3* 2.8* 3.2*  CL 98 100 99  CO2 '23 27 27  '$ GLUCOSE 179* 170* 186*  BUN 24* 12 10  CALCIUM 8.7* 7.9* 7.6*  CREATININE 0.72 0.51 0.59  GFRNONAA >60 >60 >60    LIVER FUNCTION TESTS: Recent Labs    05/28/22 1242  BILITOT 1.0  AST 24  ALT 16  ALKPHOS 67  PROT 7.3  ALBUMIN 3.1*    Assessment and Plan: Diverticulitis with abscess  S/p successful percutaneous pigtail drainage catheter placed 7/2 in IR Good amount of drain output, Cx GNR/GPC/pending, afebrile, wbc slight trend up 13.4 (10.7) not unexpected but should trend down in next 24 hours- continue to watch.  If patient is clinically improving, afebrile and catheter drainage remains <10-15 cc/48 hrs (excluding flush volumes) and is non-feculent, consider catheter removal pending discussion with the providing surgical service and potential drain injection.  If patient has worsening clinical symptoms, persistently febrile or has increased or consistently high drainage output, it may be necessary to repeat CT imaging to evaluate for residual abscess or fistula formation.   Drainage catheter has retained suture within catheter, prior to removal of drain cut off the  hub to release the retention suture forming the pigtail.  Plan: Continue saline flushes with 5 cc NS. Record output Q shift. Dressing changes QD or PRN if soiled.  Secure chat Epic message IR APP or on call IR MD if difficulty flushing or sudden change in drain output.  Repeat imaging/possible drain injection once output < 10 mL/QD (excluding flush material.)  Discharge planning: Please secure chat epic message IR APP or on call IR MD prior to patient d/c to ensure appropriate follow up plans are in place. Typically patient will follow up with IR clinic 10-14 days post d/c for repeat imaging/possible drain injection. IR scheduler will contact patient with date/time of appointment. Patient will need to flush drain QD with 5 cc NS,  record output QD, dressing changes every 2-3 days or earlier if soiled.   IR will continue to follow - please call with questions or concerns.  [facility] Radiology [address]  [address]  [place] [address] [phone]   Electronically Signed: Hedy Jacob, PA-C 05/30/2022, 1:12 PM

## 2022-05-31 DIAGNOSIS — K5720 Diverticulitis of large intestine with perforation and abscess without bleeding: Secondary | ICD-10-CM | POA: Diagnosis not present

## 2022-05-31 LAB — BASIC METABOLIC PANEL
Anion gap: 9 (ref 5–15)
BUN: 12 mg/dL (ref 8–23)
CO2: 25 mmol/L (ref 22–32)
Calcium: 7.5 mg/dL — ABNORMAL LOW (ref 8.9–10.3)
Chloride: 99 mmol/L (ref 98–111)
Creatinine, Ser: 0.56 mg/dL (ref 0.44–1.00)
GFR, Estimated: >60 mL/min (ref >60)
Glucose, Bld: 164 mg/dL — ABNORMAL HIGH (ref 70–99)
Potassium: 2.6 mmol/L — CL (ref 3.5–5.1)
Sodium: 133 mmol/L — ABNORMAL LOW (ref 135–145)

## 2022-05-31 LAB — CBC
HCT: 31.4 % — ABNORMAL LOW (ref 36.0–46.0)
Hemoglobin: 10.1 g/dL — ABNORMAL LOW (ref 12.0–15.0)
MCH: 27.7 pg (ref 26.0–34.0)
MCHC: 32.2 g/dL (ref 30.0–36.0)
MCV: 86 fL (ref 80.0–100.0)
Platelets: 223 10*3/uL (ref 150–400)
RBC: 3.65 MIL/uL — ABNORMAL LOW (ref 3.87–5.11)
RDW: 11.8 % (ref 11.5–15.5)
WBC: 9.8 10*3/uL (ref 4.0–10.5)
nRBC: 0 % (ref 0.0–0.2)

## 2022-05-31 LAB — GLUCOSE, CAPILLARY
Glucose-Capillary: 177 mg/dL — ABNORMAL HIGH (ref 70–99)
Glucose-Capillary: 171 mg/dL — ABNORMAL HIGH (ref 70–99)
Glucose-Capillary: 164 mg/dL — ABNORMAL HIGH (ref 70–99)

## 2022-05-31 LAB — MAGNESIUM: Magnesium: 1.3 mg/dL — ABNORMAL LOW (ref 1.7–2.4)

## 2022-05-31 LAB — HEMOGLOBIN A1C
Hgb A1c MFr Bld: 8.3 % — ABNORMAL HIGH (ref 4.8–5.6)
Mean Plasma Glucose: 192 mg/dL

## 2022-05-31 MED ORDER — POTASSIUM CHLORIDE 10 MEQ/100ML IV SOLN
10.0000 meq | INTRAVENOUS | Status: AC
  Administered 2022-05-31 (×4): 10 meq via INTRAVENOUS
  Filled 2022-05-31 (×4): qty 100

## 2022-05-31 MED ORDER — MAGNESIUM SULFATE 2 GM/50ML IV SOLN
2.0000 g | Freq: Once | INTRAVENOUS | Status: AC
  Administered 2022-05-31: 2 g via INTRAVENOUS
  Filled 2022-05-31: qty 50

## 2022-05-31 MED ORDER — POTASSIUM CHLORIDE CRYS ER 20 MEQ PO TBCR
40.0000 meq | EXTENDED_RELEASE_TABLET | Freq: Once | ORAL | Status: AC
  Administered 2022-05-31: 40 meq via ORAL
  Filled 2022-05-31: qty 2

## 2022-05-31 NOTE — Progress Notes (Signed)
05/31/2022  Subjective: No acute events overnight, but this morning her Potassium is critically low at 2.6 and Magnesium is low at 1.3.  She is asymptomatic but does feel tired this morning.  Tolerating diet.  Pain in LLQ much improved.  Vital signs: Temp:  [98 F (36.7 C)-98.9 F (37.2 C)] 98.1 F (36.7 C) (07/04 0747) Pulse Rate:  [76-82] 76 (07/04 0747) Resp:  [16-20] 16 (07/04 0747) BP: (116-133)/(56-68) 118/56 (07/04 0747) SpO2:  [91 %-96 %] 96 % (07/04 0747)   Intake/Output: 07/03 0701 - 07/04 0700 In: 790.7 [P.O.:358; IV Piggyback:422.7] Out: 35 [Drains:35] Last BM Date : 05/28/22  Physical Exam: Constitutional:  No acute distress Abdomen:  soft, non-distended, appropriately sore to palpation in LLQ.  Drain in place with now more serous fluid.  Labs:  Recent Labs    05/30/22 0314 05/31/22 0406  WBC 13.4* 9.8  HGB 10.2* 10.1*  HCT 32.6* 31.4*  PLT 241 223   Recent Labs    05/30/22 0314 05/31/22 0406  NA 135 133*  K 3.2* 2.6*  CL 99 99  CO2 27 25  GLUCOSE 186* 164*  BUN 10 12  CREATININE 0.59 0.56  CALCIUM 7.6* 7.5*   Recent Labs    05/29/22 0647  LABPROT 14.5  INR 1.1    Imaging: No results found.  Assessment/Plan: This is a 76 y.o. female with acute diverticulitis with a 6.1 cm abscess, now s/p IR drainage.  --The patient is tolerating a regular diet and her pain is improving.  The drain is working well and the fluid is thinning/clearing today.  From this alone the patient would be ready for discharge today.  However, her K is critically low and her Mg is also quite low.  Will be repleting those today with po/IV Kcl and IV Mag.   --As such, discussed with her that it'd be better to keep her here today for repletion of her electrolytes and tomorrow check another BMP/Mg, with anticipation of d/c home tomorrow.  She is in agreement. --Continue IV abx and drain flushing. --All questions answered.   I spent 35 minutes dedicated to the care of this  patient on the date of this encounter to include pre-visit review of records, face-to-face time with the patient discussing diagnosis and management, and any post-visit coordination of care.  Melvyn Neth, [facility]

## 2022-05-31 NOTE — Care Management Important Message (Signed)
Important Message  Patient Details  Name: Sarah [name] MRN: [id number] Date of Birth: Sep 07, 1946   Medicare Important Message Given:  Yes     Dannette Barbara 05/31/2022, 11:48 AM

## 2022-06-01 ENCOUNTER — Other Ambulatory Visit: Payer: Self-pay | Admitting: Interventional Radiology

## 2022-06-01 DIAGNOSIS — K5720 Diverticulitis of large intestine with perforation and abscess without bleeding: Secondary | ICD-10-CM

## 2022-06-01 LAB — BASIC METABOLIC PANEL
Anion gap: 6 (ref 5–15)
BUN: 9 mg/dL (ref 8–23)
CO2: 28 mmol/L (ref 22–32)
Calcium: 7.6 mg/dL — ABNORMAL LOW (ref 8.9–10.3)
Chloride: 99 mmol/L (ref 98–111)
Creatinine, Ser: 0.43 mg/dL — ABNORMAL LOW (ref 0.44–1.00)
GFR, Estimated: >60 mL/min (ref >60)
Glucose, Bld: 185 mg/dL — ABNORMAL HIGH (ref 70–99)
Potassium: 3.1 mmol/L — ABNORMAL LOW (ref 3.5–5.1)
Sodium: 133 mmol/L — ABNORMAL LOW (ref 135–145)

## 2022-06-01 LAB — MAGNESIUM: Magnesium: 1.3 mg/dL — ABNORMAL LOW (ref 1.7–2.4)

## 2022-06-01 LAB — GLUCOSE, CAPILLARY: Glucose-Capillary: 182 mg/dL — ABNORMAL HIGH (ref 70–99)

## 2022-06-01 NOTE — Plan of Care (Signed)
Patient discharged to home.  Reviewed discharge instructions and drain care with patient who verbalized understanding.  Supplies for drain care and flushing given. Piv removed.

## 2022-06-01 NOTE — Discharge Summary (Signed)
Physician Discharge Summary  Patient ID: Sarah [name] MRN: [id number] DOB/AGE: 1946/06/04 76 y.o.  Admit date: 05/28/2022 Discharge date: 06/01/22  Admission Diagnoses: diverticulitis with abscess  Discharge Diagnoses:  Same as above plus hypokalemia  Discharged Condition: good  Hospital Course: admitted for above. IR consult for CT guided drainage of abscess.  Post procedure, recovered well.  K repleted when noted to be low, asymptomatic from that standpoint.  At time of d/c, pain controlled, drain with scant serous drainage.  F/u for colonoscopy, discussion of possible resection on an elective basis after drain interrogation by IR in a few weeks  Consults:  IR  Discharge Exam: Blood pressure (!) 133/57, pulse 73, temperature 97.9 F (36.6 C), temperature source Oral, resp. rate 18, height '5\' 3"'$  (1.6 m), weight 63.3 kg, SpO2 97 %. General appearance: alert, cooperative, and no distress GI: soft, no guarding, TTP around drain site, improving  Disposition:  Discharge disposition: 01-Home or Self Care        Allergies as of 06/01/2022       Reactions   Penicillins Anaphylaxis   Sulfa Antibiotics Rash        Medication List     TAKE these medications    ALLERGY RELIEF PO Take by mouth daily.   ciprofloxacin 500 MG tablet Commonly known as: CIPRO Take 1 tablet (500 mg total) by mouth 2 (two) times daily for 7 days.   glimepiride 4 MG tablet Commonly known as: AMARYL Take 4 mg by mouth daily with breakfast.   HAIR/SKIN/NAILS PO Take by mouth daily.   levothyroxine 150 MCG tablet Commonly known as: SYNTHROID Take 150 mcg by mouth daily before breakfast.   metFORMIN 500 MG tablet Commonly known as: GLUCOPHAGE Take 500 mg by mouth 2 (two) times daily with a meal.   metoprolol succinate 50 MG 24 hr tablet Commonly known as: TOPROL-XL Take 50 mg by mouth daily. Take with or immediately following a meal.   metroNIDAZOLE 500 MG tablet Commonly known as:  Flagyl Take 1 tablet (500 mg total) by mouth 2 (two) times daily for 7 days.   naproxen sodium 220 MG tablet Commonly known as: ALEVE Take 220 mg by mouth daily.   Olmesartan-amLODIPine-HCTZ 40-10-25 MG Tabs Take by mouth daily.   OZEMPIC (0.25 OR 0.5 MG/DOSE) [place] Inject 0.25 mg into the skin every 7 (seven) days.   potassium chloride 10 MEQ tablet Commonly known as: KLOR-CON Take 10 mEq by mouth daily.   sertraline 100 MG tablet Commonly known as: ZOLOFT Take 100 mg by mouth daily.   simvastatin 20 MG tablet Commonly known as: ZOCOR Take 20 mg by mouth daily.   sodium chloride flush 0.9 % Soln Commonly known as: NS 5 mLs by Intracatheter route every 8 (eight) hours.   tiZANidine 4 MG capsule Commonly known as: ZANAFLEX Take 4 mg by mouth 3 (three) times daily as needed for muscle spasms.   venlafaxine 37.5 MG tablet Commonly known as: EFFEXOR Take 37.5 mg by mouth daily.   VITAMIN D3 PO Take by mouth daily.        Follow-up [facility] Call.   Why: followup diveritculitis drain placement and need for re-assessment talk to scheduler she told me that she would get her scheduled as soon as she see her discharge orders because at the moment she is showing as a inpatient but soon as it drop off she will schedule her Contact information: [address]  [place] [address] [phone]         Benjamine Sprague, DO Follow up on 06/28/2022.   Specialty: Surgery Why: after initial radiology appointment appointment @ 9:30 Contact information: [place] [address] [phone]                  Total time spent arranging discharge was >26mn. Signed: IBenjamine Sprague7/03/2022, 11:23 AM

## 2022-06-02 ENCOUNTER — Other Ambulatory Visit: Payer: Self-pay | Admitting: Surgery

## 2022-06-02 DIAGNOSIS — K5720 Diverticulitis of large intestine with perforation and abscess without bleeding: Secondary | ICD-10-CM

## 2022-06-02 LAB — CULTURE, BLOOD (ROUTINE X 2)
Culture: NO GROWTH
Culture: NO GROWTH

## 2022-06-02 LAB — AEROBIC/ANAEROBIC CULTURE W GRAM STAIN (SURGICAL/DEEP WOUND)
Gram Stain: NONE SEEN
Special Requests: NORMAL

## 2022-06-05 ENCOUNTER — Emergency Department
Admission: EM | Admit: 2022-06-05 | Discharge: 2022-06-05 | Disposition: A | Discharge status: Home / Self Care | Payer: Medicare Other | Attending: Emergency Medicine | Admitting: Emergency Medicine

## 2022-06-05 ENCOUNTER — Emergency Department: Payer: Medicare Other

## 2022-06-05 ENCOUNTER — Other Ambulatory Visit: Payer: Self-pay

## 2022-06-05 DIAGNOSIS — E876 Hypokalemia: Secondary | ICD-10-CM | POA: Insufficient documentation

## 2022-06-05 DIAGNOSIS — K5790 Diverticulosis of intestine, part unspecified, without perforation or abscess without bleeding: Secondary | ICD-10-CM | POA: Insufficient documentation

## 2022-06-05 DIAGNOSIS — R531 Weakness: Secondary | ICD-10-CM | POA: Diagnosis present

## 2022-06-05 DIAGNOSIS — E119 Type 2 diabetes mellitus without complications: Secondary | ICD-10-CM | POA: Diagnosis not present

## 2022-06-05 DIAGNOSIS — K5792 Diverticulitis of intestine, part unspecified, without perforation or abscess without bleeding: Secondary | ICD-10-CM

## 2022-06-05 LAB — URINALYSIS, ROUTINE W REFLEX MICROSCOPIC
Bilirubin Urine: NEGATIVE
Glucose, UA: NEGATIVE mg/dL
Ketones, ur: NEGATIVE mg/dL
Nitrite: NEGATIVE
Protein, ur: NEGATIVE mg/dL
Specific Gravity, Urine: 1.014 (ref 1.005–1.030)
pH: 6 (ref 5.0–8.0)

## 2022-06-05 LAB — HEPATIC FUNCTION PANEL
ALT: 16 U/L (ref 0–44)
AST: 22 U/L (ref 15–41)
Albumin: 2.6 g/dL — ABNORMAL LOW (ref 3.5–5.0)
Alkaline Phosphatase: 52 U/L (ref 38–126)
Bilirubin, Direct: 0.2 mg/dL (ref 0.0–0.2)
Indirect Bilirubin: 0.4 mg/dL (ref 0.3–0.9)
Total Bilirubin: 0.6 mg/dL (ref 0.3–1.2)
Total Protein: 6.4 g/dL — ABNORMAL LOW (ref 6.5–8.1)

## 2022-06-05 LAB — LIPASE, BLOOD: Lipase: 24 U/L (ref 11–51)

## 2022-06-05 LAB — BASIC METABOLIC PANEL
Anion gap: 11 (ref 5–15)
BUN: 9 mg/dL (ref 8–23)
CO2: 27 mmol/L (ref 22–32)
Calcium: 7.5 mg/dL — ABNORMAL LOW (ref 8.9–10.3)
Chloride: 99 mmol/L (ref 98–111)
Creatinine, Ser: 0.58 mg/dL (ref 0.44–1.00)
GFR, Estimated: >60 mL/min (ref >60)
Glucose, Bld: 152 mg/dL — ABNORMAL HIGH (ref 70–99)
Potassium: 2.7 mmol/L — CL (ref 3.5–5.1)
Sodium: 137 mmol/L (ref 135–145)

## 2022-06-05 LAB — CBC
HCT: 35.1 % — ABNORMAL LOW (ref 36.0–46.0)
Hemoglobin: 10.9 g/dL — ABNORMAL LOW (ref 12.0–15.0)
MCH: 27.3 pg (ref 26.0–34.0)
MCHC: 31.1 g/dL (ref 30.0–36.0)
MCV: 87.8 fL (ref 80.0–100.0)
Platelets: 247 10*3/uL (ref 150–400)
RBC: 4 MIL/uL (ref 3.87–5.11)
RDW: 12.2 % (ref 11.5–15.5)
WBC: 8.1 10*3/uL (ref 4.0–10.5)
nRBC: 0 % (ref 0.0–0.2)

## 2022-06-05 LAB — URINALYSIS, MICROSCOPIC (REFLEX)

## 2022-06-05 LAB — TROPONIN I (HIGH SENSITIVITY): Troponin I (High Sensitivity): 6 ng/L (ref <18)

## 2022-06-05 MED ORDER — POTASSIUM CHLORIDE CRYS ER 20 MEQ PO TBCR: 20.0000 meq | EXTENDED_RELEASE_TABLET | Freq: Two times a day (BID) | ORAL | 0 refills | Status: DC

## 2022-06-05 MED ORDER — ONDANSETRON HCL 4 MG/2ML IJ SOLN
4.0000 mg | Freq: Once | INTRAMUSCULAR | Status: AC
  Administered 2022-06-05: 4 mg via INTRAVENOUS
  Filled 2022-06-05: qty 2

## 2022-06-05 MED ORDER — POTASSIUM CHLORIDE 10 MEQ/100ML IV SOLN
10.0000 meq | Freq: Once | INTRAVENOUS | Status: AC
  Administered 2022-06-05: 10 meq via INTRAVENOUS
  Filled 2022-06-05: qty 100

## 2022-06-05 MED ORDER — SODIUM CHLORIDE 0.9 % IV BOLUS
1000.0000 mL | Freq: Once | INTRAVENOUS | Status: AC
Start: 2022-06-05 — End: 2022-06-05
  Administered 2022-06-05: 1000 mL via INTRAVENOUS

## 2022-06-05 MED ORDER — IOHEXOL 300 MG/ML  SOLN
100.0000 mL | Freq: Once | INTRAMUSCULAR | Status: AC | PRN
  Administered 2022-06-05: 100 mL via INTRAVENOUS

## 2022-06-05 MED ORDER — ONDANSETRON 4 MG PO TBDP: 4.0000 mg | ORAL_TABLET | Freq: Three times a day (TID) | ORAL | 0 refills | Status: AC | PRN

## 2022-06-05 NOTE — ED Provider Notes (Signed)
[facility] Provider Note    Event Date/Time   First MD Initiated Contact with Patient 06/05/22 1042     (approximate)   History   Weakness   HPI  Sarah [name] is a 76 y.o. female with history of diabetes and recent percutaneous drainage for diverticular abscess who presents with generalized weakness over the last couple of days associated with nausea, food intolerance, and decreased p.o. intake.  The patient stated that initially she had some constipation after the procedure so took a stool softener, then developed diarrhea.  She denies any significant abdominal pain or abnormal drainage from the tube that is currently in.  She has no fever or chills.      Physical Exam   Triage Vital Signs: ED Triage Vitals  Enc Vitals Group     BP 06/05/22 1035 120/70     Pulse Rate 06/05/22 1035 100     Resp 06/05/22 1035 19     Temp 06/05/22 1035 98 F (36.7 C)     Temp src --      SpO2 06/05/22 1035 96 %     Weight --      Height --      Head Circumference --      Peak Flow --      Pain Score 06/05/22 1034 0     Pain Loc --      Pain Edu? --      Excl. in [place]? --     Most recent vital signs: Vitals:   06/05/22 1130 06/05/22 1302  BP: (!) 125/58 134/71  Pulse: 86 87  Resp: 19 16  Temp:    SpO2: 95% 100%     General: Alert and oriented, relatively well-appearing. CV:  Good peripheral perfusion.  Resp:  Normal effort.  Abd:  Soft and nontender.  No distention.  Drain site clean, dry, intact Other:  No scleral icterus.  Somewhat dry mucous membranes.   ED Results / Procedures / Treatments   Labs (all labs ordered are listed, but only abnormal results are displayed) Labs Reviewed  BASIC METABOLIC PANEL - Abnormal; Notable for the following components:      Result Value   Potassium 2.7 (*)    Glucose, Bld 152 (*)    Calcium 7.5 (*)    All other components within normal limits  CBC - Abnormal; Notable for the following components:    Hemoglobin 10.9 (*)    HCT 35.1 (*)    All other components within normal limits  URINALYSIS, ROUTINE W REFLEX MICROSCOPIC - Abnormal; Notable for the following components:   Color, Urine YELLOW (*)    APPearance CLEAR (*)    Hgb urine dipstick MODERATE (*)    Leukocytes,Ua TRACE (*)    All other components within normal limits  HEPATIC FUNCTION PANEL - Abnormal; Notable for the following components:   Total Protein 6.4 (*)    Albumin 2.6 (*)    All other components within normal limits  URINALYSIS, MICROSCOPIC (REFLEX) - Abnormal; Notable for the following components:   Bacteria, UA RARE (*)    All other components within normal limits  LIPASE, BLOOD  CBG MONITORING, ED  TROPONIN I (HIGH SENSITIVITY)     EKG  ED ECG REPORT I, Arta Silence, the attending physician, personally viewed and interpreted this ECG.  Date: 06/05/2022 EKG Time: 1041 Rate: 94 Rhythm: normal sinus rhythm QRS Axis: normal Intervals: normal ST/T Wave abnormalities: LVH with repolarization abnormality Narrative  Interpretation: no evidence of acute ischemia    RADIOLOGY  CT abdomen/pelvis: I independently viewed and interpreted the images; there are no dilated bowel loops or evidence of free fluid or free air.  Radiology report indicates the following:  IMPRESSION:  1. Interval decrease in size of the left lower quadrant para colonic  abscess status post percutaneous pigtail catheter placement. The  formed distal loop of the percutaneous pigtail catheter appears to  be positioned within the abscess cavity.  2. Similar appearance of marked wall thickening in the sigmoid colon  with pericolonic edema/inflammation. Imaging features are compatible  with diverticulitis.  3. 5 mm nonobstructing right renal stone.  4. Stable 3 mm left lower lobe pulmonary nodule. No follow-up needed  if patient is low-risk.This recommendation follows the consensus  statement: Guidelines for Management of  Incidental Pulmonary Nodules  Detected on CT Images: From the Fleischner Society 2017; Radiology  2017; 284:228-243.  5. Aortic Atherosclerosis (ICD10-I70.0).      PROCEDURES:  Critical Care performed: No  Procedures   MEDICATIONS ORDERED IN ED: Medications  potassium chloride 10 mEq in 100 mL IVPB (has no administration in time range)  sodium chloride 0.9 % bolus 1,000 mL (1,000 mLs Intravenous New Bag/Given 06/05/22 1125)  ondansetron (ZOFRAN) injection 4 mg (4 mg Intravenous Given 06/05/22 1125)  potassium chloride 10 mEq in 100 mL IVPB (10 mEq Intravenous New Bag/Given 06/05/22 1208)  iohexol (OMNIPAQUE) 300 MG/ML solution 100 mL (100 mLs Intravenous Contrast Given 06/05/22 1232)     IMPRESSION / MDM / ASSESSMENT AND PLAN / ED COURSE  I reviewed the triage vital signs and the nursing notes.  76 year old female with PMH as noted above presents with generalized weakness and nausea after an IR drainage of a diverticular abscess last week.  I reviewed the past medical records including Dr. Ines Bloomer discharge summary from 06/01/2022.  She was treated with CT-guided drainage of diverticulitis with abscess and discharged in stable condition on 7/5.  Differential diagnosis includes, but is not limited to, ileus, medication side effects, continued symptoms from diverticulitis, less likely SBO, recurrent abscess, or other acute complication.  Patient's presentation is most consistent with acute presentation with potential threat to life or bodily function.  We will obtain lab work-up, CT abdomen/pelvis, give fluids and antiemetics and reassess.  ----------------------------------------- 2:09 PM on 06/05/2022 -----------------------------------------  CT shows improvement in the abscess and the catheter positioned correctly in the abscess cavity.  There is still evidence of diverticulitis, although the patient has not completed antibiotics yet.  Lab work-up also reveals hypokalemia.  This  appears somewhat chronic for the patient although it is worse than her baseline.  She has no U waves or other EKG changes.  She has no symptoms of hypokalemia.  I ordered 2 runs of IV potassium.  Lab work-up is otherwise reassuring.  The WBC count is normal and has trended down from her recent admission.  Other electrolytes are unremarkable.  I did consider whether the patient may require admission due to her nausea and decreased p.o. intake and the need to continue antibiotics and get potassium repletion.  However, the patient has a strong preference to go home if at all possible.  She is feeling a lot better after Zofran and is now tolerating p.o.  CT shows no evidence of acute complication, and there is no clinical evidence for worsening infection or sepsis.  Therefore I feel that it is reasonable to send her home.  She still has several days of  antibiotics to go.  In addition I will prescribe ODT Zofran as well as p.o. potassium.   I counseled the patient extensively on the results of the work-up and plan of care.  I gave her very thorough return precautions and she expressed understanding.    FINAL CLINICAL IMPRESSION(S) / ED DIAGNOSES   Final diagnoses:  Hypokalemia  Diverticulitis     Rx / DC Orders   ED Discharge Orders     None        Note:  This document was prepared using Dragon voice recognition software and may include unintentional dictation errors.    Arta Silence, MD 06/05/22 [phone]

## 2022-06-05 NOTE — Discharge Instructions (Addendum)
Take the potassium as prescribed and finish the full 1 week course.  You may take the Zofran as needed for nausea.  Follow-up with your regular doctor and with the surgeon and/or interventional radiologist as planned.  Return to the ER immediately for new, worsening, or persistent severe nausea, vomiting, pain, swelling of the abdomen, bleeding or worsening drainage from the drain, fever, weakness, palpitations, difficulty breathing, or any other new or worsening symptoms that concern you.

## 2022-06-05 NOTE — ED Triage Notes (Signed)
Pt comes with c/o left lower belly pain, nausea, vomiting and weakness. Pt states loss of appetite. Pt was just seen here in ED for similar symptoms on July 1st. Pt was admitted and had procedure done. Pt has jp drain in place with yellow colored drainage.

## 2022-06-14 ENCOUNTER — Ambulatory Visit
Admission: RE | Admit: 2022-06-14 | Discharge: 2022-06-14 | Disposition: A | Discharge status: Home / Self Care | Payer: Medicare Other | Source: Ambulatory Visit | Attending: Interventional Radiology | Admitting: Interventional Radiology

## 2022-06-14 ENCOUNTER — Ambulatory Visit
Admission: RE | Admit: 2022-06-14 | Discharge: 2022-06-14 | Disposition: A | Discharge status: Home / Self Care | Payer: Medicare Other | Source: Ambulatory Visit | Attending: Surgery | Admitting: Surgery

## 2022-06-14 DIAGNOSIS — K5720 Diverticulitis of large intestine with perforation and abscess without bleeding: Secondary | ICD-10-CM

## 2022-06-14 HISTORY — PX: IR RADIOLOGIST EVAL & MGMT: IMG5224

## 2022-06-14 MED ORDER — IOPAMIDOL (ISOVUE-300) INJECTION 61%
100.0000 mL | Freq: Once | INTRAVENOUS | Status: AC | PRN
  Administered 2022-06-14: 100 mL via INTRAVENOUS

## 2022-06-28 ENCOUNTER — Ambulatory Visit: Payer: Self-pay | Admitting: Surgery

## 2022-06-28 NOTE — H&P (View-Only) (Signed)
Subjective:  CC: Diverticulitis [K57.92]  HPI:  Sarah [name] is a 76 y.o. female who returns for evaluation of above. S/p IR guided drain placement.  Most recent eval noted persistent drainage but no obvious fistulous tract, recommend re-scan around this time.  Pt continues to feel fatigued and states she is ready to have this over with.  Needs to take care of her husband and does not have energy to do so.   Has persistent feculant drainage into bag, reports roughly 57m/day.     Past Medical History:  has a past medical history of Diabetes mellitus type 2, controlled, with complications (CMS-HCC) (08/27/2014), Essential hypertension, benign, Graves' disease, Irritable bowel syndrome, and Other and unspecified hyperlipidemia.  Past Surgical History:  has a past surgical history that includes Hysterectomy; Cholecystectomy; knee surgery; egd (08/25/2005); Colonoscopy (08/25/2005); Colonoscopy (10/07/2008); and Colonoscopy (10/26/2016).  Family History: family history includes Coronary Artery Disease (Blocked arteries around heart) in her brother, father, and another family member; High blood pressure (Hypertension) in her brother, father, and another family member.  Social History:  reports that she has never smoked. She has never used smokeless tobacco. Drug use questions deferred to the physician. She reports that she does not drink alcohol.  Current Medications: has a current medication list which includes the following prescription(s): cholecalciferol, cyanocobalamin, glimepiride, levothyroxine, metformin, metoprolol succinate, olmesartan-amlodipine-hydrochlorothiazide, ozempic, potassium chloride, simvastatin, sodium chloride 0.9% flush, tizanidine, venlafaxine, herbal supplement, melatonin, onetouch ultra blue test strip, and semaglutide.  Allergies:  Allergies Allergen Reactions  Penicillins Swelling and Anaphylaxis  Sulfa (Sulfonamide Antibiotics) Swelling   ROS:  A 15 point review of  systems was performed and pertinent positives and negatives noted in HPI   Objective:    BP 114/57   Pulse 77   Ht 157.5 cm ('5\' 2"'$ )   Wt 53.5 kg (118 lb) Comment: per pt  BMI 21.58 kg/m   Constitutional :  No distress, cooperative, alert Lymphatics/Throat:  Supple with no lymphadenopathy Respiratory:  Clear to auscultation bilaterally Cardiovascular:  Regular rate and rhythm Gastrointestinal: Soft, non-tender, non-distended, no organomegaly. Persistent feculant drainage into bag Musculoskeletal: Steady gait and movement Skin: Cool and moist.  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  She is eager to get back to life without drain and also feel better overall, requesting surgery be done as soon as possible.  I agree we can proceed with surgery since this likely will not heal with just drainage soon enough.  Plan:    Discussed pathophisiology in depth again.  The risk of laparoscopic colon resection surgery includes, but not limited to, recurrence, bleeding, chronic pain, post-op infxn, post-op SBO or ileus, hernias, resection of bowel, re-anastamosis, possible ostomy placement and need for re-operation to address said risks. The risks of general anesthetic, if used, includes MI, CVA, sudden death or even reaction to anesthetic medications also discussed. Alternatives include continued observation.  Benefits include possible symptom relief, preventing further decline in health and possible death.   Typical post-op recovery time of additional days in hospital for observation afterwards also discussed.   Prep ordered.  Will proceed with ERAS protocol as well.  Pending medical clearance and workup as noted above.     The patient verbalized understanding and all questions were answered to  the patient's satisfaction.  labs/images/medications/previous  chart entries reviewed personally and relevant changes/updates noted above.

## 2022-06-28 NOTE — H&P (Signed)
Subjective:  CC: Diverticulitis [K57.92]  HPI:  Sarah [name] is a 76 y.o. female who returns for evaluation of above. S/p IR guided drain placement.  Most recent eval noted persistent drainage but no obvious fistulous tract, recommend re-scan around this time.  Pt continues to feel fatigued and states she is ready to have this over with.  Needs to take care of her husband and does not have energy to do so.   Has persistent feculant drainage into bag, reports roughly 61m/day.     Past Medical History:  has a past medical history of Diabetes mellitus type 2, controlled, with complications (CMS-HCC) (08/27/2014), Essential hypertension, benign, Graves' disease, Irritable bowel syndrome, and Other and unspecified hyperlipidemia.  Past Surgical History:  has a past surgical history that includes Hysterectomy; Cholecystectomy; knee surgery; egd (08/25/2005); Colonoscopy (08/25/2005); Colonoscopy (10/07/2008); and Colonoscopy (10/26/2016).  Family History: family history includes Coronary Artery Disease (Blocked arteries around heart) in her brother, father, and another family member; High blood pressure (Hypertension) in her brother, father, and another family member.  Social History:  reports that she has never smoked. She has never used smokeless tobacco. Drug use questions deferred to the physician. She reports that she does not drink alcohol.  Current Medications: has a current medication list which includes the following prescription(s): cholecalciferol, cyanocobalamin, glimepiride, levothyroxine, metformin, metoprolol succinate, olmesartan-amlodipine-hydrochlorothiazide, ozempic, potassium chloride, simvastatin, sodium chloride 0.9% flush, tizanidine, venlafaxine, herbal supplement, melatonin, onetouch ultra blue test strip, and semaglutide.  Allergies:  Allergies Allergen Reactions  Penicillins Swelling and Anaphylaxis  Sulfa (Sulfonamide Antibiotics) Swelling   ROS:  A 15 point review of  systems was performed and pertinent positives and negatives noted in HPI   Objective:    BP 114/57   Pulse 77   Ht 157.5 cm ('5\' 2"'$ )   Wt 53.5 kg (118 lb) Comment: per pt  BMI 21.58 kg/m   Constitutional :  No distress, cooperative, alert Lymphatics/Throat:  Supple with no lymphadenopathy Respiratory:  Clear to auscultation bilaterally Cardiovascular:  Regular rate and rhythm Gastrointestinal: Soft, non-tender, non-distended, no organomegaly. Persistent feculant drainage into bag Musculoskeletal: Steady gait and movement Skin: Cool and moist.  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  She is eager to get back to life without drain and also feel better overall, requesting surgery be done as soon as possible.  I agree we can proceed with surgery since this likely will not heal with just drainage soon enough.  Plan:    Discussed pathophisiology in depth again.  The risk of laparoscopic colon resection surgery includes, but not limited to, recurrence, bleeding, chronic pain, post-op infxn, post-op SBO or ileus, hernias, resection of bowel, re-anastamosis, possible ostomy placement and need for re-operation to address said risks. The risks of general anesthetic, if used, includes MI, CVA, sudden death or even reaction to anesthetic medications also discussed. Alternatives include continued observation.  Benefits include possible symptom relief, preventing further decline in health and possible death.   Typical post-op recovery time of additional days in hospital for observation afterwards also discussed.   Prep ordered.  Will proceed with ERAS protocol as well.  Pending medical clearance and workup as noted above.     The patient verbalized understanding and all questions were answered to  the patient's satisfaction.  labs/images/medications/previous  chart entries reviewed personally and relevant changes/updates noted above.

## 2022-06-29 ENCOUNTER — Ambulatory Visit: Payer: PRIVATE HEALTH INSURANCE | Primary: Family Medicine

## 2022-07-11 ENCOUNTER — Encounter
Admission: RE | Admit: 2022-07-11 | Discharge: 2022-07-11 | Disposition: A | Discharge status: Home / Self Care | Payer: Medicare Other | Source: Ambulatory Visit | Attending: Surgery | Admitting: Surgery

## 2022-07-11 VITALS — Ht 63.0 in | Wt 122.0 lb

## 2022-07-11 DIAGNOSIS — E119 Type 2 diabetes mellitus without complications: Secondary | ICD-10-CM

## 2022-07-11 NOTE — Patient Instructions (Signed)
Your procedure is scheduled on: 07/19/21 Report to [facility]. To find out your arrival time please call [phone] between 1PM - 3PM on 07/18/22.  Remember: Instructions that are not followed completely may result in serious medical risk, up to and including death, or upon the discretion of your surgeon and anesthesiologist your surgery may need to be rescheduled.     _X__ 1. DIET AS INSTRUCTED BY DR Lysle Pearl  __X__2.  On the morning of surgery brush your teeth with toothpaste and water, you                 may rinse your mouth with mouthwash if you wish.  Do not swallow any              toothpaste of mouthwash.     _X__ 3.  No Alcohol for 24 hours before or after surgery.   _X__ 4.  Do Not Smoke or use e-cigarettes For 24 Hours Prior to Your Surgery.                 Do not use any chewable tobacco products for at least 6 hours prior to                 surgery.  ____  5.  Bring all medications with you on the day of surgery if instructed.   __X__  6.  Notify your doctor if there is any change in your medical condition      (cold, fever, infections).     Do not wear jewelry, make-up, hairpins, clips or nail polish. Do not wear lotions, powders, or perfumes.  Do not shave 48 hours prior to surgery. Men may shave face and neck. Do not bring valuables to the hospital.    [facility] is not responsible for any belongings or valuables.  Contacts, dentures/partials or body piercings may not be worn into surgery. Bring a case for your contacts, glasses or hearing aids, a denture cup will be supplied. Leave your suitcase in the car. After surgery it may be brought to your room. For patients admitted to the hospital, discharge time is determined by your treatment team.   Patients discharged the day of surgery will not be allowed to drive home.   __X__ Take these medicines the morning of surgery with A SIP OF WATER:    1. levothyroxine  (SYNTHROID) 150 MCG tablet  2. metoprolol succinate (TOPROL-XL) 50 MG 24 hr tablet  3. venlafaxine (EFFEXOR) 37.5 MG tablet  4.  5.  6.  ____ Fleet Enema (as directed)   ____ Use CHG Soap/SAGE wipes as directed  ____ Use inhalers on the day of surgery  ____ Stop metformin/Janumet/Farxiga 2 days prior to surgery    ____ Take 1/2 of usual insulin dose the night before surgery. No insulin the morning          of surgery.   ____ Stop Blood Thinners Coumadin/Plavix/Xarelto/Pleta/Pradaxa/Eliquis/Effient/Aspirin  on   Or contact your Surgeon, Cardiologist or Medical Doctor regarding  ability to stop your blood thinners  __X__ Stop Anti-inflammatories 7 days before surgery such as Advil, Ibuprofen, Motrin,  BC or Goodies Powder, Naprosyn, Naproxen, Aleve, Aspirin    __X__ Stop all herbal supplements, fish oil or vitamin E until after surgery.    ____ Bring C-Pap to the hospital.

## 2022-07-19 ENCOUNTER — Inpatient Hospital Stay: Payer: Medicare Other | Admitting: Registered Nurse

## 2022-07-19 ENCOUNTER — Other Ambulatory Visit: Payer: Self-pay

## 2022-07-19 ENCOUNTER — Encounter
Admission: RE | Disposition: A | Discharge status: Skilled Nursing Facility | Payer: Self-pay | Source: Home / Self Care | Attending: Internal Medicine

## 2022-07-19 ENCOUNTER — Encounter: Payer: Self-pay | Admitting: Surgery

## 2022-07-19 ENCOUNTER — Inpatient Hospital Stay
Admission: RE | Admit: 2022-07-19 | Discharge: 2022-08-01 | DRG: 329 | Disposition: A | Discharge status: Skilled Nursing Facility | Payer: Medicare Other | Attending: Internal Medicine | Admitting: Internal Medicine

## 2022-07-19 DIAGNOSIS — I255 Ischemic cardiomyopathy: Secondary | ICD-10-CM | POA: Diagnosis present

## 2022-07-19 DIAGNOSIS — E878 Other disorders of electrolyte and fluid balance, not elsewhere classified: Secondary | ICD-10-CM | POA: Diagnosis not present

## 2022-07-19 DIAGNOSIS — K589 Irritable bowel syndrome without diarrhea: Secondary | ICD-10-CM | POA: Diagnosis present

## 2022-07-19 DIAGNOSIS — R6521 Severe sepsis with septic shock: Secondary | ICD-10-CM | POA: Diagnosis not present

## 2022-07-19 DIAGNOSIS — E663 Overweight: Secondary | ICD-10-CM | POA: Diagnosis present

## 2022-07-19 DIAGNOSIS — G629 Polyneuropathy, unspecified: Secondary | ICD-10-CM | POA: Diagnosis present

## 2022-07-19 DIAGNOSIS — Z7189 Other specified counseling: Secondary | ICD-10-CM | POA: Diagnosis not present

## 2022-07-19 DIAGNOSIS — N390 Urinary tract infection, site not specified: Secondary | ICD-10-CM | POA: Diagnosis present

## 2022-07-19 DIAGNOSIS — Z20822 Contact with and (suspected) exposure to covid-19: Secondary | ICD-10-CM | POA: Diagnosis present

## 2022-07-19 DIAGNOSIS — J9811 Atelectasis: Secondary | ICD-10-CM | POA: Diagnosis not present

## 2022-07-19 DIAGNOSIS — E0500 Thyrotoxicosis with diffuse goiter without thyrotoxic crisis or storm: Secondary | ICD-10-CM | POA: Diagnosis present

## 2022-07-19 DIAGNOSIS — I5021 Acute systolic (congestive) heart failure: Secondary | ICD-10-CM | POA: Diagnosis not present

## 2022-07-19 DIAGNOSIS — D649 Anemia, unspecified: Secondary | ICD-10-CM | POA: Diagnosis not present

## 2022-07-19 DIAGNOSIS — Z9842 Cataract extraction status, left eye: Secondary | ICD-10-CM

## 2022-07-19 DIAGNOSIS — R570 Cardiogenic shock: Secondary | ICD-10-CM | POA: Diagnosis not present

## 2022-07-19 DIAGNOSIS — C189 Malignant neoplasm of colon, unspecified: Secondary | ICD-10-CM | POA: Diagnosis present

## 2022-07-19 DIAGNOSIS — E119 Type 2 diabetes mellitus without complications: Secondary | ICD-10-CM

## 2022-07-19 DIAGNOSIS — K660 Peritoneal adhesions (postprocedural) (postinfection): Secondary | ICD-10-CM | POA: Diagnosis present

## 2022-07-19 DIAGNOSIS — I4891 Unspecified atrial fibrillation: Secondary | ICD-10-CM | POA: Diagnosis not present

## 2022-07-19 DIAGNOSIS — E1165 Type 2 diabetes mellitus with hyperglycemia: Secondary | ICD-10-CM | POA: Diagnosis present

## 2022-07-19 DIAGNOSIS — I471 Supraventricular tachycardia: Secondary | ICD-10-CM | POA: Diagnosis present

## 2022-07-19 DIAGNOSIS — J9601 Acute respiratory failure with hypoxia: Secondary | ICD-10-CM | POA: Diagnosis not present

## 2022-07-19 DIAGNOSIS — I214 Non-ST elevation (NSTEMI) myocardial infarction: Secondary | ICD-10-CM | POA: Diagnosis present

## 2022-07-19 DIAGNOSIS — E039 Hypothyroidism, unspecified: Secondary | ICD-10-CM | POA: Diagnosis present

## 2022-07-19 DIAGNOSIS — K5792 Diverticulitis of intestine, part unspecified, without perforation or abscess without bleeding: Principal | ICD-10-CM | POA: Diagnosis present

## 2022-07-19 DIAGNOSIS — A419 Sepsis, unspecified organism: Secondary | ICD-10-CM | POA: Diagnosis not present

## 2022-07-19 DIAGNOSIS — Z9841 Cataract extraction status, right eye: Secondary | ICD-10-CM

## 2022-07-19 DIAGNOSIS — I491 Atrial premature depolarization: Secondary | ICD-10-CM | POA: Diagnosis present

## 2022-07-19 DIAGNOSIS — E871 Hypo-osmolality and hyponatremia: Secondary | ICD-10-CM | POA: Diagnosis present

## 2022-07-19 DIAGNOSIS — Z90710 Acquired absence of both cervix and uterus: Secondary | ICD-10-CM

## 2022-07-19 DIAGNOSIS — E43 Unspecified severe protein-calorie malnutrition: Secondary | ICD-10-CM | POA: Diagnosis present

## 2022-07-19 DIAGNOSIS — Z7984 Long term (current) use of oral hypoglycemic drugs: Secondary | ICD-10-CM

## 2022-07-19 DIAGNOSIS — Z79899 Other long term (current) drug therapy: Secondary | ICD-10-CM

## 2022-07-19 DIAGNOSIS — K5720 Diverticulitis of large intestine with perforation and abscess without bleeding: Secondary | ICD-10-CM | POA: Diagnosis present

## 2022-07-19 DIAGNOSIS — I493 Ventricular premature depolarization: Secondary | ICD-10-CM | POA: Diagnosis present

## 2022-07-19 DIAGNOSIS — Z6826 Body mass index (BMI) 26.0-26.9, adult: Secondary | ICD-10-CM

## 2022-07-19 DIAGNOSIS — I480 Paroxysmal atrial fibrillation: Secondary | ICD-10-CM | POA: Diagnosis present

## 2022-07-19 DIAGNOSIS — Z881 Allergy status to other antibiotic agents status: Secondary | ICD-10-CM

## 2022-07-19 DIAGNOSIS — Z8249 Family history of ischemic heart disease and other diseases of the circulatory system: Secondary | ICD-10-CM

## 2022-07-19 DIAGNOSIS — I959 Hypotension, unspecified: Secondary | ICD-10-CM | POA: Diagnosis not present

## 2022-07-19 DIAGNOSIS — I5023 Acute on chronic systolic (congestive) heart failure: Secondary | ICD-10-CM | POA: Diagnosis not present

## 2022-07-19 DIAGNOSIS — Z515 Encounter for palliative care: Secondary | ICD-10-CM | POA: Diagnosis not present

## 2022-07-19 DIAGNOSIS — N170 Acute kidney failure with tubular necrosis: Secondary | ICD-10-CM | POA: Diagnosis not present

## 2022-07-19 DIAGNOSIS — Z79890 Hormone replacement therapy (postmenopausal): Secondary | ICD-10-CM

## 2022-07-19 DIAGNOSIS — Z5331 Laparoscopic surgical procedure converted to open procedure: Secondary | ICD-10-CM

## 2022-07-19 DIAGNOSIS — L89152 Pressure ulcer of sacral region, stage 2: Secondary | ICD-10-CM | POA: Diagnosis present

## 2022-07-19 DIAGNOSIS — L8990 Pressure ulcer of unspecified site, unspecified stage: Secondary | ICD-10-CM | POA: Diagnosis present

## 2022-07-19 DIAGNOSIS — Z9049 Acquired absence of other specified parts of digestive tract: Secondary | ICD-10-CM

## 2022-07-19 DIAGNOSIS — E872 Acidosis, unspecified: Secondary | ICD-10-CM | POA: Diagnosis present

## 2022-07-19 DIAGNOSIS — E785 Hyperlipidemia, unspecified: Secondary | ICD-10-CM | POA: Diagnosis present

## 2022-07-19 DIAGNOSIS — E876 Hypokalemia: Secondary | ICD-10-CM | POA: Diagnosis present

## 2022-07-19 DIAGNOSIS — Z961 Presence of intraocular lens: Secondary | ICD-10-CM | POA: Diagnosis present

## 2022-07-19 DIAGNOSIS — I110 Hypertensive heart disease with heart failure: Secondary | ICD-10-CM | POA: Diagnosis present

## 2022-07-19 DIAGNOSIS — Z923 Personal history of irradiation: Secondary | ICD-10-CM

## 2022-07-19 DIAGNOSIS — I2510 Atherosclerotic heart disease of native coronary artery without angina pectoris: Secondary | ICD-10-CM | POA: Diagnosis present

## 2022-07-19 DIAGNOSIS — Z880 Allergy status to penicillin: Secondary | ICD-10-CM

## 2022-07-19 LAB — GLUCOSE, CAPILLARY
Glucose-Capillary: 207 mg/dL — ABNORMAL HIGH (ref 70–99)
Glucose-Capillary: 208 mg/dL — ABNORMAL HIGH (ref 70–99)
Glucose-Capillary: 161 mg/dL — ABNORMAL HIGH (ref 70–99)

## 2022-07-19 LAB — TYPE AND SCREEN
ABO/RH(D): A POS
Antibody Screen: NEGATIVE

## 2022-07-19 LAB — ABO/RH: ABO/RH(D): A POS

## 2022-07-19 SURGERY — COLECTOMY, SIGMOID, ROBOT-ASSISTED
Anesthesia: General | Site: Abdomen

## 2022-07-19 MED ORDER — FENTANYL CITRATE (PF) 100 MCG/2ML IJ SOLN
INTRAMUSCULAR | Status: AC
  Filled 2022-07-19: qty 2

## 2022-07-19 MED ORDER — GABAPENTIN 300 MG PO CAPS: 300.0000 mg | ORAL_CAPSULE | ORAL | Status: AC

## 2022-07-19 MED ORDER — CHLORHEXIDINE GLUCONATE CLOTH 2 % EX PADS: 6.0000 | MEDICATED_PAD | Freq: Once | CUTANEOUS | Status: DC

## 2022-07-19 MED ORDER — OLMESARTAN-AMLODIPINE-HCTZ 40-10-25 MG PO TABS: 1.0000 | ORAL_TABLET | ORAL | Status: DC

## 2022-07-19 MED ORDER — ACETAMINOPHEN 10 MG/ML IV SOLN
INTRAVENOUS | Status: DC | PRN
  Administered 2022-07-19: 1000 mg via INTRAVENOUS

## 2022-07-19 MED ORDER — DEXAMETHASONE SODIUM PHOSPHATE 10 MG/ML IJ SOLN
INTRAMUSCULAR | Status: DC | PRN
  Administered 2022-07-19: 5 mg via INTRAVENOUS

## 2022-07-19 MED ORDER — ACETAMINOPHEN 500 MG PO TABS
1000.0000 mg | ORAL_TABLET | Freq: Four times a day (QID) | ORAL | Status: DC
  Administered 2022-07-20 (×3): 1000 mg via ORAL
  Filled 2022-07-19 (×4): qty 2

## 2022-07-19 MED ORDER — 0.9 % SODIUM CHLORIDE (POUR BTL) OPTIME
TOPICAL | Status: DC | PRN
  Administered 2022-07-19: 1000 mL
  Administered 2022-07-19: 500 mL

## 2022-07-19 MED ORDER — SUCCINYLCHOLINE CHLORIDE 200 MG/10ML IV SOSY
PREFILLED_SYRINGE | INTRAVENOUS | Status: DC | PRN
  Administered 2022-07-19: 80 mg via INTRAVENOUS

## 2022-07-19 MED ORDER — ORAL CARE MOUTH RINSE: 15.0000 mL | Freq: Once | OROMUCOSAL | Status: DC

## 2022-07-19 MED ORDER — IRBESARTAN 150 MG PO TABS
300.0000 mg | ORAL_TABLET | Freq: Every day | ORAL | Status: DC
  Administered 2022-07-20: 300 mg via ORAL
  Filled 2022-07-19: qty 2

## 2022-07-19 MED ORDER — SODIUM CHLORIDE 0.9 % IV SOLN: INTRAVENOUS | Status: DC

## 2022-07-19 MED ORDER — KETAMINE HCL 10 MG/ML IJ SOLN
INTRAMUSCULAR | Status: DC | PRN
  Administered 2022-07-19: 10 mg via INTRAVENOUS
  Administered 2022-07-19: 20 mg via INTRAVENOUS
  Administered 2022-07-19: 10 mg via INTRAVENOUS
  Administered 2022-07-19 (×2): 20 mg via INTRAVENOUS
  Administered 2022-07-19 (×2): 10 mg via INTRAVENOUS

## 2022-07-19 MED ORDER — CHLORHEXIDINE GLUCONATE 0.12 % MT SOLN: 15.0000 mL | Freq: Once | OROMUCOSAL | Status: DC

## 2022-07-19 MED ORDER — METOPROLOL SUCCINATE ER 50 MG PO TB24
50.0000 mg | ORAL_TABLET | Freq: Every day | ORAL | Status: DC
  Administered 2022-07-20: 50 mg via ORAL
  Filled 2022-07-19: qty 1

## 2022-07-19 MED ORDER — ALBUMIN HUMAN 5 % IV SOLN
INTRAVENOUS | Status: AC
Start: 2022-07-19 — End: ?
  Filled 2022-07-19: qty 250

## 2022-07-19 MED ORDER — CHLORHEXIDINE GLUCONATE 0.12 % MT SOLN
OROMUCOSAL | Status: AC
  Filled 2022-07-19: qty 15

## 2022-07-19 MED ORDER — SODIUM CHLORIDE 0.9 % IV BOLUS
500.0000 mL | Freq: Once | INTRAVENOUS | Status: AC
Start: 2022-07-20 — End: 2022-07-19
  Administered 2022-07-19: 500 mL via INTRAVENOUS

## 2022-07-19 MED ORDER — VENLAFAXINE HCL 37.5 MG PO TABS
37.5000 mg | ORAL_TABLET | Freq: Every day | ORAL | Status: DC
  Administered 2022-07-20 – 2022-08-01 (×11): 37.5 mg via ORAL
  Filled 2022-07-19 (×14): qty 1

## 2022-07-19 MED ORDER — VANCOMYCIN HCL IN DEXTROSE 1-5 GM/200ML-% IV SOLN: 1000.0000 mg | Freq: Once | INTRAVENOUS | Status: DC

## 2022-07-19 MED ORDER — ONDANSETRON HCL 4 MG/2ML IJ SOLN
INTRAMUSCULAR | Status: AC
  Filled 2022-07-19: qty 2

## 2022-07-19 MED ORDER — PROPOFOL 10 MG/ML IV BOLUS
INTRAVENOUS | Status: DC | PRN
  Administered 2022-07-19: 80 mg via INTRAVENOUS

## 2022-07-19 MED ORDER — KETAMINE HCL 50 MG/5ML IJ SOSY
PREFILLED_SYRINGE | INTRAMUSCULAR | Status: AC
  Filled 2022-07-19: qty 5

## 2022-07-19 MED ORDER — TRAMADOL HCL 50 MG PO TABS
50.0000 mg | ORAL_TABLET | Freq: Four times a day (QID) | ORAL | Status: DC | PRN
  Administered 2022-07-21: 50 mg via ORAL
  Filled 2022-07-19: qty 1

## 2022-07-19 MED ORDER — OXYCODONE HCL 5 MG PO TABS: 5.0000 mg | ORAL_TABLET | Freq: Once | ORAL | Status: DC | PRN

## 2022-07-19 MED ORDER — LEVOTHYROXINE SODIUM 50 MCG PO TABS
150.0000 ug | ORAL_TABLET | Freq: Every day | ORAL | Status: DC
  Administered 2022-07-20: 150 ug via ORAL
  Filled 2022-07-19: qty 1

## 2022-07-19 MED ORDER — AMLODIPINE BESYLATE 10 MG PO TABS
10.0000 mg | ORAL_TABLET | Freq: Every day | ORAL | Status: DC
  Administered 2022-07-20: 10 mg via ORAL
  Filled 2022-07-19: qty 1

## 2022-07-19 MED ORDER — FENTANYL CITRATE (PF) 100 MCG/2ML IJ SOLN
25.0000 ug | INTRAMUSCULAR | Status: DC | PRN
  Administered 2022-07-19: 25 ug via INTRAVENOUS

## 2022-07-19 MED ORDER — HYDROMORPHONE HCL 1 MG/ML IJ SOLN
0.5000 mg | INTRAMUSCULAR | Status: DC | PRN
  Administered 2022-07-22 – 2022-07-25 (×4): 0.5 mg via INTRAVENOUS
  Filled 2022-07-19 (×4): qty 1

## 2022-07-19 MED ORDER — METRONIDAZOLE 500 MG/100ML IV SOLN
500.0000 mg | Freq: Two times a day (BID) | INTRAVENOUS | Status: DC
  Administered 2022-07-20 – 2022-07-23 (×7): 500 mg via INTRAVENOUS
  Filled 2022-07-19 (×10): qty 100

## 2022-07-19 MED ORDER — PHENYLEPHRINE HCL-NACL 20-0.9 MG/250ML-% IV SOLN
INTRAVENOUS | Status: AC
  Filled 2022-07-19: qty 250

## 2022-07-19 MED ORDER — SODIUM CHLORIDE 0.9 % IV SOLN
INTRAVENOUS | Status: DC
  Administered 2022-07-19: 75 mL/h via INTRAVENOUS

## 2022-07-19 MED ORDER — SUCCINYLCHOLINE CHLORIDE 200 MG/10ML IV SOSY
PREFILLED_SYRINGE | INTRAVENOUS | Status: AC
  Filled 2022-07-19: qty 10

## 2022-07-19 MED ORDER — ONDANSETRON HCL 4 MG/2ML IJ SOLN: 4.0000 mg | Freq: Once | INTRAMUSCULAR | Status: DC | PRN

## 2022-07-19 MED ORDER — SODIUM CHLORIDE 0.9 % IV SOLN
2.0000 g | Freq: Once | INTRAVENOUS | Status: AC
  Administered 2022-07-19 (×2): 2 g via INTRAVENOUS

## 2022-07-19 MED ORDER — LACTATED RINGERS IV SOLN: INTRAVENOUS | Status: DC | PRN

## 2022-07-19 MED ORDER — ROCURONIUM BROMIDE 100 MG/10ML IV SOLN
INTRAVENOUS | Status: DC | PRN
  Administered 2022-07-19 (×3): 20 mg via INTRAVENOUS
  Administered 2022-07-19: 40 mg via INTRAVENOUS
  Administered 2022-07-19: 30 mg via INTRAVENOUS

## 2022-07-19 MED ORDER — VANCOMYCIN HCL IN DEXTROSE 1-5 GM/200ML-% IV SOLN
INTRAVENOUS | Status: AC
  Filled 2022-07-19: qty 200

## 2022-07-19 MED ORDER — PROPOFOL 10 MG/ML IV BOLUS
INTRAVENOUS | Status: AC
  Filled 2022-07-19: qty 20

## 2022-07-19 MED ORDER — FAMOTIDINE 20 MG PO TABS: 20.0000 mg | ORAL_TABLET | Freq: Once | ORAL | Status: AC

## 2022-07-19 MED ORDER — FAMOTIDINE 20 MG PO TABS
ORAL_TABLET | ORAL | Status: AC
  Administered 2022-07-19: 20 mg via ORAL
  Filled 2022-07-19: qty 1

## 2022-07-19 MED ORDER — ONDANSETRON HCL 4 MG/2ML IJ SOLN
4.0000 mg | Freq: Four times a day (QID) | INTRAMUSCULAR | Status: DC | PRN
  Administered 2022-07-21 – 2022-07-23 (×2): 4 mg via INTRAVENOUS
  Filled 2022-07-19 (×2): qty 2

## 2022-07-19 MED ORDER — ONDANSETRON HCL 4 MG/2ML IJ SOLN
INTRAMUSCULAR | Status: DC | PRN
  Administered 2022-07-19: 4 mg via INTRAVENOUS

## 2022-07-19 MED ORDER — SODIUM CHLORIDE (PF) 0.9 % IJ SOLN
INTRAMUSCULAR | Status: AC
  Filled 2022-07-19: qty 50

## 2022-07-19 MED ORDER — ACETAMINOPHEN 500 MG PO TABS
ORAL_TABLET | ORAL | Status: AC
  Administered 2022-07-19: 1000 mg via ORAL
  Filled 2022-07-19: qty 2

## 2022-07-19 MED ORDER — PHENYLEPHRINE 80 MCG/ML (10ML) SYRINGE FOR IV PUSH (FOR BLOOD PRESSURE SUPPORT)
PREFILLED_SYRINGE | INTRAVENOUS | Status: AC
  Filled 2022-07-19: qty 10

## 2022-07-19 MED ORDER — GABAPENTIN 300 MG PO CAPS
300.0000 mg | ORAL_CAPSULE | Freq: Two times a day (BID) | ORAL | Status: DC
  Administered 2022-07-19 – 2022-08-01 (×24): 300 mg via ORAL
  Filled 2022-07-19 (×24): qty 1

## 2022-07-19 MED ORDER — SODIUM CHLORIDE 0.9 % IV SOLN
INTRAVENOUS | Status: AC
  Filled 2022-07-19: qty 2

## 2022-07-19 MED ORDER — INSULIN ASPART 100 UNIT/ML IJ SOLN
0.0000 [IU] | Freq: Three times a day (TID) | INTRAMUSCULAR | Status: DC
  Administered 2022-07-20 (×2): 3 [IU] via SUBCUTANEOUS
  Administered 2022-07-20: 2 [IU] via SUBCUTANEOUS
  Administered 2022-07-21: 3 [IU] via SUBCUTANEOUS
  Filled 2022-07-19 (×4): qty 1

## 2022-07-19 MED ORDER — FENTANYL CITRATE (PF) 100 MCG/2ML IJ SOLN
INTRAMUSCULAR | Status: DC | PRN
  Administered 2022-07-19 (×3): 50 ug via INTRAVENOUS

## 2022-07-19 MED ORDER — SEVOFLURANE IN SOLN
RESPIRATORY_TRACT | Status: AC
  Filled 2022-07-19: qty 250

## 2022-07-19 MED ORDER — SODIUM CHLORIDE 0.9 % IV SOLN
2.0000 g | INTRAVENOUS | Status: DC
  Administered 2022-07-20 – 2022-07-23 (×4): 2 g via INTRAVENOUS
  Filled 2022-07-19 (×3): qty 20
  Filled 2022-07-19 (×2): qty 2
  Filled 2022-07-19: qty 20
  Filled 2022-07-19: qty 2

## 2022-07-19 MED ORDER — OXYCODONE HCL 5 MG/5ML PO SOLN: 5.0000 mg | Freq: Once | ORAL | Status: DC | PRN

## 2022-07-19 MED ORDER — HYDROCHLOROTHIAZIDE 25 MG PO TABS
25.0000 mg | ORAL_TABLET | Freq: Every day | ORAL | Status: DC
  Administered 2022-07-20: 25 mg via ORAL
  Filled 2022-07-19: qty 1

## 2022-07-19 MED ORDER — BUPIVACAINE LIPOSOME 1.3 % IJ SUSP
INTRAMUSCULAR | Status: AC
  Filled 2022-07-19: qty 20

## 2022-07-19 MED ORDER — ACETAMINOPHEN 10 MG/ML IV SOLN
INTRAVENOUS | Status: AC
  Filled 2022-07-19: qty 100

## 2022-07-19 MED ORDER — BUPIVACAINE HCL (PF) 0.5 % IJ SOLN
INTRAMUSCULAR | Status: AC
  Filled 2022-07-19: qty 30

## 2022-07-19 MED ORDER — ALBUMIN HUMAN 5 % IV SOLN: INTRAVENOUS | Status: DC | PRN

## 2022-07-19 MED ORDER — SODIUM CHLORIDE (PF) 0.9 % IJ SOLN
INTRAMUSCULAR | Status: DC | PRN
  Administered 2022-07-19: 100 mL

## 2022-07-19 MED ORDER — ONDANSETRON 4 MG PO TBDP: 4.0000 mg | ORAL_TABLET | Freq: Four times a day (QID) | ORAL | Status: DC | PRN

## 2022-07-19 MED ORDER — PANTOPRAZOLE SODIUM 40 MG IV SOLR
40.0000 mg | Freq: Every day | INTRAVENOUS | Status: DC
  Administered 2022-07-19 – 2022-07-31 (×13): 40 mg via INTRAVENOUS
  Filled 2022-07-19 (×14): qty 10

## 2022-07-19 MED ORDER — PHENYLEPHRINE HCL (PRESSORS) 10 MG/ML IV SOLN
INTRAVENOUS | Status: DC | PRN
  Administered 2022-07-19: 160 ug via INTRAVENOUS
  Administered 2022-07-19: 80 ug via INTRAVENOUS
  Administered 2022-07-19: 160 ug via INTRAVENOUS
  Administered 2022-07-19: 120 ug via INTRAVENOUS

## 2022-07-19 MED ORDER — ACETAMINOPHEN 10 MG/ML IV SOLN: 1000.0000 mg | Freq: Once | INTRAVENOUS | Status: DC | PRN

## 2022-07-19 MED ORDER — LIDOCAINE HCL (CARDIAC) PF 100 MG/5ML IV SOSY
PREFILLED_SYRINGE | INTRAVENOUS | Status: DC | PRN
  Administered 2022-07-19: 20 mg via INTRAVENOUS
  Administered 2022-07-19: 60 mg via INTRAVENOUS

## 2022-07-19 MED ORDER — PHENYLEPHRINE HCL-NACL 20-0.9 MG/250ML-% IV SOLN
INTRAVENOUS | Status: DC | PRN
  Administered 2022-07-19: 10 ug/min via INTRAVENOUS

## 2022-07-19 MED ORDER — ROCURONIUM BROMIDE 10 MG/ML (PF) SYRINGE
PREFILLED_SYRINGE | INTRAVENOUS | Status: AC
  Filled 2022-07-19: qty 10

## 2022-07-19 MED ORDER — LIDOCAINE HCL (PF) 2 % IJ SOLN
INTRAMUSCULAR | Status: AC
  Filled 2022-07-19: qty 5

## 2022-07-19 MED ORDER — DEXAMETHASONE SODIUM PHOSPHATE 10 MG/ML IJ SOLN
INTRAMUSCULAR | Status: AC
  Filled 2022-07-19: qty 1

## 2022-07-19 MED ORDER — SUGAMMADEX SODIUM 200 MG/2ML IV SOLN
INTRAVENOUS | Status: DC | PRN
  Administered 2022-07-19: 200 mg via INTRAVENOUS

## 2022-07-19 MED ORDER — GABAPENTIN 300 MG PO CAPS
ORAL_CAPSULE | ORAL | Status: AC
  Administered 2022-07-19: 300 mg via ORAL
  Filled 2022-07-19: qty 1

## 2022-07-19 MED ORDER — ACETAMINOPHEN 500 MG PO TABS: 1000.0000 mg | ORAL_TABLET | ORAL | Status: AC

## 2022-07-19 MED ORDER — OXYCODONE HCL 5 MG PO TABS: 5.0000 mg | ORAL_TABLET | ORAL | Status: DC | PRN

## 2022-07-19 SURGICAL SUPPLY — 98 items
BAG PRESSURE INF DISP 1000 (BAG) IMPLANT
BASIN KIT SINGLE STR (MISCELLANEOUS) ×1 IMPLANT
BLADE CLIPPER SURG (BLADE) ×1 IMPLANT
BLADE SURG SZ10 CARB STEEL (BLADE) IMPLANT
BLADE SURG SZ11 CARB STEEL (BLADE) ×1 IMPLANT
BULB RESERV EVAC DRAIN JP 100C (MISCELLANEOUS) IMPLANT
CANNULA REDUC XI 12-8 STAPL (CANNULA) ×1
CANNULA REDUCER 12-8 DVNC XI (CANNULA) ×1 IMPLANT
CNTNR SPEC 2.5X3XGRAD LEK (MISCELLANEOUS) ×1
CONT SPEC 4OZ STER OR WHT (MISCELLANEOUS) ×1
CONTAINER SPEC 2.5X3XGRAD LEK (MISCELLANEOUS) IMPLANT
COVER TIP SHEARS 8 DVNC (MISCELLANEOUS) ×1 IMPLANT
COVER TIP SHEARS 8MM DA VINCI (MISCELLANEOUS) ×1
DRAPE ARM DVNC X/XI (DISPOSABLE) ×4 IMPLANT
DRAPE COLUMN DVNC XI (DISPOSABLE) ×1 IMPLANT
DRAPE DA VINCI XI ARM (DISPOSABLE) ×4
DRAPE DA VINCI XI COLUMN (DISPOSABLE) ×1
DRAPE LEGGINS SURG 28X43 STRL (DRAPES) ×1 IMPLANT
DRAPE UNDER BUTTOCK W/FLU (DRAPES) ×1 IMPLANT
DRSG TEGADERM 4X4.75 (GAUZE/BANDAGES/DRESSINGS) IMPLANT
ELECT BLADE 6.5 EXT (BLADE) IMPLANT
ELECT CAUTERY BLADE 6.4 (BLADE) IMPLANT
ELECT REM PT RETURN 9FT ADLT (ELECTROSURGICAL) ×1
ELECTRODE REM PT RTRN 9FT ADLT (ELECTROSURGICAL) ×1 IMPLANT
GAUZE SPONGE 4X4 12PLY STRL (GAUZE/BANDAGES/DRESSINGS) IMPLANT
GLOVE BIOGEL PI IND STRL 7.0 (GLOVE) ×3 IMPLANT
GLOVE BIOGEL PI INDICATOR 7.0 (GLOVE) ×3
GLOVE SURG SYN 6.5 ES PF (GLOVE) ×5 IMPLANT
GLOVE SURG SYN 6.5 PF PI (GLOVE) ×3 IMPLANT
GOWN STRL REUS W/ TWL LRG LVL3 (GOWN DISPOSABLE) ×6 IMPLANT
GOWN STRL REUS W/TWL LRG LVL3 (GOWN DISPOSABLE) ×7
HANDLE YANKAUER SUCT BULB TIP (MISCELLANEOUS) ×1 IMPLANT
IRRIGATOR SUCT 8 DISP DVNC XI (IRRIGATION / IRRIGATOR) IMPLANT
IRRIGATOR SUCTION 8MM XI DISP (IRRIGATION / IRRIGATOR) ×1
IV NS 1000ML (IV SOLUTION) ×1
IV NS 1000ML BAXH (IV SOLUTION) IMPLANT
KIT OSTOMY DRAINABLE 2.75 STR (WOUND CARE) IMPLANT
KIT PINK PAD W/HEAD ARE REST (MISCELLANEOUS) ×1
KIT PINK PAD W/HEAD ARM REST (MISCELLANEOUS) ×1 IMPLANT
KIT PREVENA INCISION MGT20CM45 (CANNISTER) IMPLANT
LABEL OR SOLS (LABEL) IMPLANT
LIGASURE IMPACT 36 18CM CVD LR (INSTRUMENTS) IMPLANT
MANIFOLD NEPTUNE II (INSTRUMENTS) ×1 IMPLANT
NDL INSUFFLATION 14GA 120MM (NEEDLE) IMPLANT
NEEDLE HYPO 22GX1.5 SAFETY (NEEDLE) ×1 IMPLANT
NEEDLE INSUFFLATION 14GA 120MM (NEEDLE) ×1 IMPLANT
OBTURATOR OPTICAL STANDARD 8MM (TROCAR) ×1
OBTURATOR OPTICAL STND 8 DVNC (TROCAR) ×1
OBTURATOR OPTICALSTD 8 DVNC (TROCAR) ×1 IMPLANT
PACK COLON CLEAN CLOSURE (MISCELLANEOUS) ×1 IMPLANT
PACK LAP CHOLECYSTECTOMY (MISCELLANEOUS) ×1 IMPLANT
PENCIL SMOKE EVACUATOR (MISCELLANEOUS) ×1 IMPLANT
RELOAD STAPLE 60 4.3 GRN DVNC (STAPLE) IMPLANT
RELOAD STAPLER 4.3X60 GRN DVNC (STAPLE) ×4 IMPLANT
SCISSORS METZENBAUM CVD 33 (INSTRUMENTS) IMPLANT
SEAL CANN UNIV 5-8 DVNC XI (MISCELLANEOUS) ×3 IMPLANT
SEAL XI 5MM-8MM UNIVERSAL (MISCELLANEOUS) ×3
SEALER VESSEL DA VINCI XI (MISCELLANEOUS) ×1
SEALER VESSEL EXT DVNC XI (MISCELLANEOUS) IMPLANT
SET TRI-LUMEN FLTR TB AIRSEAL (TUBING) ×1 IMPLANT
SOL PREP PVP 2OZ (MISCELLANEOUS) ×1
SOLUTION ELECTROLUBE (MISCELLANEOUS) ×1 IMPLANT
SOLUTION PREP PVP 2OZ (MISCELLANEOUS) ×1 IMPLANT
SPONGE DRAIN TRACH 4X4 STRL 2S (GAUZE/BANDAGES/DRESSINGS) IMPLANT
SPONGE T-LAP 18X18 ~~LOC~~+RFID (SPONGE) IMPLANT
STAPLER 60 DA VINCI SURE FORM (STAPLE) ×1
STAPLER 60 SUREFORM DVNC (STAPLE) IMPLANT
STAPLER CANNULA SEAL DVNC XI (STAPLE) ×1 IMPLANT
STAPLER CANNULA SEAL XI (STAPLE) ×1
STAPLER CIRCULAR MANUAL XL 29 (STAPLE) IMPLANT
STAPLER CVD CUT GN 40 RELOAD (ENDOMECHANICALS) ×1 IMPLANT
STAPLER CVD CUT GRN 40 RELOAD (ENDOMECHANICALS) IMPLANT
STAPLER RELOAD 4.3X60 GREEN (STAPLE) ×4
STAPLER RELOAD 4.3X60 GRN DVNC (STAPLE) ×4
STAPLER RELOADABLE 65 2-0 SUT (MISCELLANEOUS) IMPLANT
STAPLER SKIN PROX 35W (STAPLE) IMPLANT
STAPLER SYS INTERNAL RELOAD SS (MISCELLANEOUS) ×1 IMPLANT
SURGILUBE 2OZ TUBE FLIPTOP (MISCELLANEOUS) ×1 IMPLANT
SUT ETHILON 3-0 FS-10 30 BLK (SUTURE) ×1
SUT MNCRL AB 4-0 PS2 18 (SUTURE) ×2 IMPLANT
SUT PDS AB 1 CT1 36 (SUTURE) ×2 IMPLANT
SUT SILK 3 0 SH 30 (SUTURE) IMPLANT
SUT V-LOC 90 ABS 3-0 VLT  V-20 (SUTURE) ×1
SUT V-LOC 90 ABS 3-0 VLT V-20 (SUTURE) IMPLANT
SUT VIC AB 3-0 SH 27 (SUTURE) ×3
SUT VIC AB 3-0 SH 27X BRD (SUTURE) ×1 IMPLANT
SUT VICRYL 3-0 CR8 SH (SUTURE) IMPLANT
SUTURE EHLN 3-0 FS-10 30 BLK (SUTURE) IMPLANT
SYR 30ML LL (SYRINGE) ×2 IMPLANT
SYR BULB IRRIG 60ML STRL (SYRINGE) IMPLANT
SYS LAPSCP GELPORT 120MM (MISCELLANEOUS) ×1
SYS TROCAR 1.5-3 SLV ABD GEL (ENDOMECHANICALS) ×1
SYSTEM LAPSCP GELPORT 120MM (MISCELLANEOUS) ×1 IMPLANT
SYSTEM TROCR 1.5-3 SLV ABD GEL (ENDOMECHANICALS) ×1 IMPLANT
TRAY FOLEY MTR SLVR 16FR STAT (SET/KITS/TRAYS/PACK) ×1 IMPLANT
TROCAR PORT AIRSEAL 8X100 (TROCAR) IMPLANT
TROCAR XCEL NON-BLD 5MMX100MML (ENDOMECHANICALS) IMPLANT
WATER STERILE IRR 500ML POUR (IV SOLUTION) ×1 IMPLANT

## 2022-07-19 NOTE — Anesthesia Procedure Notes (Addendum)
Procedure Name: Intubation Date/Time: 07/19/2022 11:41 AM  Performed by: Lia Foyer, CRNAPre-anesthesia Checklist: Patient identified, Emergency Drugs available, Suction available and Patient being monitored Patient Re-evaluated:Patient Re-evaluated prior to induction Oxygen Delivery Method: Circle system utilized Preoxygenation: Pre-oxygenation with 100% oxygen Induction Type: IV induction and Rapid sequence Laryngoscope Size: McGraph and 3 Grade View: Grade I Tube type: Oral Tube size: 6.5 mm Number of attempts: 1 Airway Equipment and Method: Stylet and Oral airway Placement Confirmation: ETT inserted through vocal cords under direct vision, positive ETCO2 and breath sounds checked- equal and bilateral Secured at: 19 cm Tube secured with: Tape Dental Injury: Teeth and Oropharynx as per pre-operative assessment

## 2022-07-19 NOTE — Interval H&P Note (Signed)
History and Physical Interval Note:  07/19/2022 10:56 AM  Sarah [name]  has presented today for surgery, with the diagnosis of Diverticulitis K57.92.  The various methods of treatment have been discussed with the patient and family. After consideration of risks, benefits and other options for treatment, the patient has consented to  Procedure(s): XI ROBOT ASSISTED SIGMOID COLECTOMY (N/A) as a surgical intervention.  The patient's history has been reviewed, patient examined, no change in status, stable for surgery.  I have reviewed the patient's chart and labs.  Questions were answered to the patient's satisfaction.     [name]

## 2022-07-19 NOTE — Progress Notes (Signed)
   07/19/22 2119  Vitals  Temp 98 F (36.7 C)  Temp Source Oral  BP (!) 91/50  MAP (mmHg) (!) 64  BP Location Left Arm  BP Method Automatic  Patient Position (if appropriate) Lying  Pulse Rate 92  Pulse Rate Source Dinamap  Resp 18  Level of Consciousness  Level of Consciousness Alert  MEWS COLOR  MEWS Score Color Green  Oxygen Therapy  SpO2 99 %  O2 Device Nasal Cannula  O2 Flow Rate (L/min) 2 L/min  Pain Assessment  Pain Scale 0-10  Pain Score 3  Pain Intervention(s) Repositioned  PCA/Epidural/Spinal Assessment  Respiratory Pattern Regular;Unlabored;Symmetrical  MEWS Score  MEWS Temp 0  MEWS Systolic 1  MEWS Pulse 0  MEWS RR 0  MEWS LOC 0  MEWS Score 1     Patient arrived to the unit, comfort was afforded. VSS, will continue to monitor.

## 2022-07-19 NOTE — OR Nursing (Signed)
Patient arrived stating she has thrown up most of her prep during the night. Dr. Lysle Pearl is aware of this. Patient with irregular heart beat. Dr. Bertell Maria notified and EKG obtained. Dr. Bertell Maria has reviewed this and reports it is okay to proceed with surgery. Dr. Bertell Maria changed antibiotic from vancomycin to cefotetan. Pharmacist has contacted me and reports she will not verify this due to reaction to PCN. Dr. Bertell Maria and Legrand Como from anethesia have been notified of this.

## 2022-07-19 NOTE — Transfer of Care (Signed)
Immediate Anesthesia Transfer of Care Note  Patient: Sarah [name]  Procedure(s) Performed: XI ROBOT ASSISTED SIGMOID COLECTOMY converted to open hartmans procedure (Abdomen)  Patient Location: PACU  Anesthesia Type:General  Level of Consciousness: sedated and patient cooperative  Airway & Oxygen Therapy: Patient connected to face mask oxygen  Post-op Assessment: Report given to RN and Post -op Vital signs reviewed and stable  Post vital signs: Reviewed and stable  Last Vitals:  Vitals Value Taken Time  BP    Temp    Pulse 94 07/19/22 1957  Resp 18 07/19/22 1957  SpO2 100 % 07/19/22 1957  Vitals shown include unvalidated device data.  Last Pain:  Vitals:   07/19/22 1033  TempSrc: Temporal  PainSc: 6       Patients Stated Pain Goal: 3 (21/78/37 5423)  Complications: No notable events documented.

## 2022-07-19 NOTE — Anesthesia Preprocedure Evaluation (Addendum)
Anesthesia Evaluation  Patient identified by MRN, date of birth, ID band Patient awake    Reviewed: Allergy & Precautions, NPO status , Patient's Chart, lab work & pertinent test results  History of Anesthesia Complications Negative for: history of anesthetic complications  Airway Mallampati: II  TM Distance: >3 FB Neck ROM: Full    Dental  (+) Partial Upper, Chipped   Pulmonary neg pulmonary ROS, neg sleep apnea, neg COPD, Patient abstained from smoking.Not current smoker,    Pulmonary exam normal breath sounds clear to auscultation       Cardiovascular Exercise Tolerance: Good METShypertension, Pt. on medications (-) CAD and (-) Past MI + dysrhythmias  Rhythm:Irregular Rate:Normal - Systolic murmurs Hx PVCs Normal stress echo 2018, holter monitoring showed occasional PVCs.   Neuro/Psych negative neurological ROS  negative psych ROS   GI/Hepatic neg GERD  ,(+)     (-) substance abuse  ,   Endo/Other  diabetes, Well Controlled, Oral Hypoglycemic AgentsHypothyroidism   Renal/GU negative Renal ROS     Musculoskeletal  (+) Arthritis ,   Abdominal   Peds  Hematology   Anesthesia Other Findings Past Medical History: No date: Arthritis     Comment:  hands No date: Cancer ([place])     Comment:  thyroid- radiation No date: Diabetes mellitus without complication (HCC) No date: Hyperlipidemia No date: Hypertension No date: Hypothyroidism No date: IBS (irritable bowel syndrome)  Reproductive/Obstetrics                            Anesthesia Physical Anesthesia Plan  ASA: 2  Anesthesia Plan: General   Post-op Pain Management: Tylenol PO (pre-op)* and Gabapentin PO (pre-op)*   Induction: Intravenous and Rapid sequence  PONV Risk Score and Plan: 4 or greater and Ondansetron, Dexamethasone and Treatment may vary due to age or medical condition  Airway Management Planned: Oral  ETT  Additional Equipment: None  Intra-op Plan:   Post-operative Plan: Extubation in OR  Informed Consent: I have reviewed the patients History and Physical, chart, labs and discussed the procedure including the risks, benefits and alternatives for the proposed anesthesia with the patient or authorized representative who has indicated his/her understanding and acceptance.     Dental advisory given  Plan Discussed with: CRNA and Surgeon  Anesthesia Plan Comments: (Discussed risks of anesthesia with patient, including PONV, sore throat, lip/dental/eye damage. Rare risks discussed as well, such as cardiorespiratory and neurological sequelae, and allergic reactions. Discussed the role of CRNA in patient's perioperative care. Patient understands. Patient with frequent PACs today, nothing new , per prior cardiology note. Patient feeling nauseated this morning, plan for RSI. Patient has listed allergy to PCN - "swelling all over" , discovered over 30 years ago. Severe blistering skin reaction (SJS/TEN)? no Liver or kidney injury caused by PCN? no Hemolytic anemia from PCN? no Drug fever? no Painful swollen joints? no Severe reaction involving inside of mouth, eye, or genital ulcers? No  Based on current evidence Petra Kuba al, J Allergy Clin Immunol Pract, 2019), will proceed with cephalosporin use: Yes  )        Anesthesia Quick Evaluation

## 2022-07-20 ENCOUNTER — Inpatient Hospital Stay
Admission: RE | Admit: 2022-07-20 | Discharge: 2022-07-20 | Disposition: A | Discharge status: Home / Self Care | Payer: Medicare Other | Source: Ambulatory Visit | Attending: Cardiology | Admitting: Cardiology

## 2022-07-20 DIAGNOSIS — K5792 Diverticulitis of intestine, part unspecified, without perforation or abscess without bleeding: Secondary | ICD-10-CM | POA: Diagnosis not present

## 2022-07-20 LAB — BASIC METABOLIC PANEL
Anion gap: 7 (ref 5–15)
Anion gap: 11 (ref 5–15)
Anion gap: 8 (ref 5–15)
BUN: 15 mg/dL (ref 8–23)
BUN: 23 mg/dL (ref 8–23)
BUN: 24 mg/dL — ABNORMAL HIGH (ref 8–23)
CO2: 21 mmol/L — ABNORMAL LOW (ref 22–32)
CO2: 19 mmol/L — ABNORMAL LOW (ref 22–32)
CO2: 22 mmol/L (ref 22–32)
Calcium: 7.3 mg/dL — ABNORMAL LOW (ref 8.9–10.3)
Calcium: 7.4 mg/dL — ABNORMAL LOW (ref 8.9–10.3)
Calcium: 7.3 mg/dL — ABNORMAL LOW (ref 8.9–10.3)
Chloride: 112 mmol/L — ABNORMAL HIGH (ref 98–111)
Chloride: 112 mmol/L — ABNORMAL HIGH (ref 98–111)
Chloride: 112 mmol/L — ABNORMAL HIGH (ref 98–111)
Creatinine, Ser: 0.81 mg/dL (ref 0.44–1.00)
Creatinine, Ser: 1.14 mg/dL — ABNORMAL HIGH (ref 0.44–1.00)
Creatinine, Ser: 1.16 mg/dL — ABNORMAL HIGH (ref 0.44–1.00)
GFR, Estimated: >60 mL/min (ref >60)
GFR, Estimated: 50 mL/min — ABNORMAL LOW (ref >60)
GFR, Estimated: 49 mL/min — ABNORMAL LOW (ref >60)
Glucose, Bld: 184 mg/dL — ABNORMAL HIGH (ref 70–99)
Glucose, Bld: 149 mg/dL — ABNORMAL HIGH (ref 70–99)
Glucose, Bld: 163 mg/dL — ABNORMAL HIGH (ref 70–99)
Potassium: 2.8 mmol/L — ABNORMAL LOW (ref 3.5–5.1)
Potassium: 3.2 mmol/L — ABNORMAL LOW (ref 3.5–5.1)
Potassium: 3.3 mmol/L — ABNORMAL LOW (ref 3.5–5.1)
Sodium: 140 mmol/L (ref 135–145)
Sodium: 142 mmol/L (ref 135–145)
Sodium: 142 mmol/L (ref 135–145)

## 2022-07-20 LAB — GLUCOSE, CAPILLARY
Glucose-Capillary: 128 mg/dL — ABNORMAL HIGH (ref 70–99)
Glucose-Capillary: 147 mg/dL — ABNORMAL HIGH (ref 70–99)
Glucose-Capillary: 155 mg/dL — ABNORMAL HIGH (ref 70–99)
Glucose-Capillary: 158 mg/dL — ABNORMAL HIGH (ref 70–99)
Glucose-Capillary: 159 mg/dL — ABNORMAL HIGH (ref 70–99)
Glucose-Capillary: 161 mg/dL — ABNORMAL HIGH (ref 70–99)
Glucose-Capillary: 180 mg/dL — ABNORMAL HIGH (ref 70–99)

## 2022-07-20 LAB — CBC
HCT: 30.8 % — ABNORMAL LOW (ref 36.0–46.0)
HCT: 28.1 % — ABNORMAL LOW (ref 36.0–46.0)
Hemoglobin: 9.6 g/dL — ABNORMAL LOW (ref 12.0–15.0)
Hemoglobin: 8.3 g/dL — ABNORMAL LOW (ref 12.0–15.0)
MCH: 27.7 pg (ref 26.0–34.0)
MCH: 27 pg (ref 26.0–34.0)
MCHC: 31.2 g/dL (ref 30.0–36.0)
MCHC: 29.5 g/dL — ABNORMAL LOW (ref 30.0–36.0)
MCV: 88.8 fL (ref 80.0–100.0)
MCV: 91.5 fL (ref 80.0–100.0)
Platelets: 256 10*3/uL (ref 150–400)
Platelets: 305 10*3/uL (ref 150–400)
RBC: 3.47 MIL/uL — ABNORMAL LOW (ref 3.87–5.11)
RBC: 3.07 MIL/uL — ABNORMAL LOW (ref 3.87–5.11)
RDW: 15.9 % — ABNORMAL HIGH (ref 11.5–15.5)
RDW: 16.5 % — ABNORMAL HIGH (ref 11.5–15.5)
WBC: 5.4 10*3/uL (ref 4.0–10.5)
WBC: 11.3 10*3/uL — ABNORMAL HIGH (ref 4.0–10.5)
nRBC: 0 % (ref 0.0–0.2)
nRBC: 0 % (ref 0.0–0.2)

## 2022-07-20 LAB — APTT: aPTT: 44 seconds — ABNORMAL HIGH (ref 24–36)

## 2022-07-20 LAB — MAGNESIUM
Magnesium: 1.5 mg/dL — ABNORMAL LOW (ref 1.7–2.4)
Magnesium: 2.1 mg/dL (ref 1.7–2.4)
Magnesium: 2 mg/dL (ref 1.7–2.4)

## 2022-07-20 LAB — ECHOCARDIOGRAM COMPLETE
AR max vel: 1.94 cm2
AV Area VTI: 2.19 cm2
AV Area mean vel: 1.77 cm2
AV Mean grad: 5 mmHg
AV Peak grad: 9 mmHg
Ao pk vel: 1.5 m/s
Area-P 1/2: 10.25 cm2
Height: 63 in
S' Lateral: 3.95 cm
Weight: 1952.01 oz

## 2022-07-20 LAB — PHOSPHORUS: Phosphorus: 3.4 mg/dL (ref 2.5–4.6)

## 2022-07-20 LAB — HEMOGLOBIN AND HEMATOCRIT, BLOOD
HCT: 30.6 % — ABNORMAL LOW (ref 36.0–46.0)
Hemoglobin: 9.4 g/dL — ABNORMAL LOW (ref 12.0–15.0)

## 2022-07-20 LAB — POTASSIUM
Potassium: 3 mmol/L — ABNORMAL LOW (ref 3.5–5.1)
Potassium: 3.2 mmol/L — ABNORMAL LOW (ref 3.5–5.1)

## 2022-07-20 LAB — TROPONIN I (HIGH SENSITIVITY)
Troponin I (High Sensitivity): 6999 ng/L (ref <18)
Troponin I (High Sensitivity): 9494 ng/L (ref <18)
Troponin I (High Sensitivity): 8837 ng/L (ref <18)
Troponin I (High Sensitivity): 11207 ng/L (ref <18)

## 2022-07-20 LAB — LACTIC ACID, PLASMA: Lactic Acid, Venous: 3.4 mmol/L (ref 0.5–1.9)

## 2022-07-20 LAB — PROCALCITONIN: Procalcitonin: 1.39 ng/mL

## 2022-07-20 LAB — PROTIME-INR
INR: 1.7 — ABNORMAL HIGH (ref 0.8–1.2)
Prothrombin Time: 19.5 seconds — ABNORMAL HIGH (ref 11.4–15.2)

## 2022-07-20 MED ORDER — ETOMIDATE 2 MG/ML IV SOLN
INTRAVENOUS | Status: AC
  Filled 2022-07-20: qty 10

## 2022-07-20 MED ORDER — ETOMIDATE 2 MG/ML IV SOLN: 20.0000 mg | Freq: Once | INTRAVENOUS | Status: DC

## 2022-07-20 MED ORDER — ROCURONIUM BROMIDE 10 MG/ML (PF) SYRINGE
PREFILLED_SYRINGE | INTRAVENOUS | Status: AC
  Filled 2022-07-20: qty 10

## 2022-07-20 MED ORDER — AMIODARONE HCL IN DEXTROSE 360-4.14 MG/200ML-% IV SOLN
INTRAVENOUS | Status: AC
  Filled 2022-07-20: qty 200

## 2022-07-20 MED ORDER — HEPARIN BOLUS VIA INFUSION
3300.0000 [IU] | Freq: Once | INTRAVENOUS | Status: DC
  Filled 2022-07-20: qty 3300

## 2022-07-20 MED ORDER — ATORVASTATIN CALCIUM 20 MG PO TABS
40.0000 mg | ORAL_TABLET | Freq: Every day | ORAL | Status: DC
  Administered 2022-07-21 – 2022-07-31 (×11): 40 mg via ORAL
  Filled 2022-07-20 (×11): qty 2

## 2022-07-20 MED ORDER — FENTANYL CITRATE (PF) 100 MCG/2ML IJ SOLN
INTRAMUSCULAR | Status: AC
  Filled 2022-07-20: qty 2

## 2022-07-20 MED ORDER — POTASSIUM CHLORIDE CRYS ER 20 MEQ PO TBCR: 40.0000 meq | EXTENDED_RELEASE_TABLET | Freq: Once | ORAL | Status: DC

## 2022-07-20 MED ORDER — AMIODARONE IV BOLUS ONLY 150 MG/100ML
INTRAVENOUS | Status: AC
  Administered 2022-07-20: 150 mg via INTRAVENOUS
  Filled 2022-07-20: qty 100

## 2022-07-20 MED ORDER — POTASSIUM CHLORIDE CRYS ER 20 MEQ PO TBCR
40.0000 meq | EXTENDED_RELEASE_TABLET | Freq: Once | ORAL | Status: AC
  Administered 2022-07-20: 40 meq via ORAL
  Filled 2022-07-20: qty 2

## 2022-07-20 MED ORDER — IRBESARTAN 150 MG PO TABS: 150.0000 mg | ORAL_TABLET | Freq: Every day | ORAL | Status: DC

## 2022-07-20 MED ORDER — AMIODARONE IV BOLUS ONLY 150 MG/100ML: 150.0000 mg | Freq: Once | INTRAVENOUS | Status: AC

## 2022-07-20 MED ORDER — POTASSIUM CHLORIDE 10 MEQ/50ML IV SOLN: 10.0000 meq | INTRAVENOUS | Status: DC

## 2022-07-20 MED ORDER — MAGNESIUM SULFATE 2 GM/50ML IV SOLN
2.0000 g | Freq: Once | INTRAVENOUS | Status: AC
  Administered 2022-07-20: 2 g via INTRAVENOUS
  Filled 2022-07-20: qty 50

## 2022-07-20 MED ORDER — CHLORHEXIDINE GLUCONATE CLOTH 2 % EX PADS
6.0000 | MEDICATED_PAD | Freq: Every day | CUTANEOUS | Status: DC
  Administered 2022-07-20 – 2022-08-01 (×11): 6 via TOPICAL

## 2022-07-20 MED ORDER — LACTATED RINGERS IV BOLUS
1000.0000 mL | Freq: Once | INTRAVENOUS | Status: AC
  Administered 2022-07-20: 1000 mL via INTRAVENOUS

## 2022-07-20 MED ORDER — HEPARIN (PORCINE) 25000 UT/250ML-% IV SOLN: 650.0000 [IU]/h | INTRAVENOUS | Status: DC

## 2022-07-20 MED ORDER — ASPIRIN 81 MG PO CHEW
81.0000 mg | CHEWABLE_TABLET | Freq: Every day | ORAL | Status: DC
  Administered 2022-07-21 – 2022-08-01 (×11): 81 mg via ORAL
  Filled 2022-07-20 (×12): qty 1

## 2022-07-20 MED ORDER — LACTATED RINGERS IV SOLN
INTRAVENOUS | Status: AC
Start: 2022-07-20 — End: 2022-07-21

## 2022-07-20 MED ORDER — ONDANSETRON HCL 4 MG/2ML IJ SOLN
4.0000 mg | Freq: Once | INTRAMUSCULAR | Status: AC
Start: 2022-07-20 — End: 2022-07-20
  Administered 2022-07-20: 4 mg via INTRAVENOUS
  Filled 2022-07-20: qty 2

## 2022-07-20 MED ORDER — ROCURONIUM BROMIDE 10 MG/ML (PF) SYRINGE: 50.0000 mg | PREFILLED_SYRINGE | Freq: Once | INTRAVENOUS | Status: DC

## 2022-07-20 MED ORDER — AMIODARONE LOAD VIA INFUSION
150.0000 mg | Freq: Once | INTRAVENOUS | Status: AC
  Administered 2022-07-20: 150 mg via INTRAVENOUS
  Filled 2022-07-20: qty 83.34

## 2022-07-20 MED ORDER — NOREPINEPHRINE 4 MG/250ML-% IV SOLN
0.0000 ug/min | INTRAVENOUS | Status: DC
  Filled 2022-07-20: qty 250

## 2022-07-20 MED ORDER — AMIODARONE HCL IN DEXTROSE 360-4.14 MG/200ML-% IV SOLN
30.0000 mg/h | INTRAVENOUS | Status: DC
  Administered 2022-07-21 – 2022-07-22 (×5): 30 mg/h via INTRAVENOUS
  Filled 2022-07-20 (×4): qty 200

## 2022-07-20 MED ORDER — SODIUM CHLORIDE 0.9 % IV BOLUS
500.0000 mL | Freq: Once | INTRAVENOUS | Status: AC
  Administered 2022-07-20: 500 mL via INTRAVENOUS

## 2022-07-20 MED ORDER — PHENYLEPHRINE HCL-NACL 20-0.9 MG/250ML-% IV SOLN
0.0000 ug/min | INTRAVENOUS | Status: DC
  Administered 2022-07-20: 20 ug/min via INTRAVENOUS
  Filled 2022-07-20: qty 250

## 2022-07-20 MED ORDER — ASPIRIN 81 MG PO CHEW
324.0000 mg | CHEWABLE_TABLET | Freq: Once | ORAL | Status: AC
  Administered 2022-07-20: 324 mg via ORAL
  Filled 2022-07-20: qty 4

## 2022-07-20 MED ORDER — AMIODARONE HCL IN DEXTROSE 360-4.14 MG/200ML-% IV SOLN
60.0000 mg/h | INTRAVENOUS | Status: DC
  Administered 2022-07-20: 60 mg/h via INTRAVENOUS
  Filled 2022-07-20 (×3): qty 200

## 2022-07-20 MED ORDER — FENTANYL CITRATE (PF) 100 MCG/2ML IJ SOLN: 100.0000 ug | Freq: Once | INTRAMUSCULAR | Status: DC

## 2022-07-20 MED ORDER — HEPARIN (PORCINE) 25000 UT/250ML-% IV SOLN
1700.0000 [IU]/h | INTRAVENOUS | Status: DC
Start: 2022-07-20 — End: 2022-07-25
  Administered 2022-07-20: 650 [IU]/h via INTRAVENOUS
  Administered 2022-07-22: 1450 [IU]/h via INTRAVENOUS
  Administered 2022-07-22: 1050 [IU]/h via INTRAVENOUS
  Administered 2022-07-23: 1600 [IU]/h via INTRAVENOUS
  Administered 2022-07-24 (×2): 1700 [IU]/h via INTRAVENOUS
  Filled 2022-07-20 (×6): qty 250

## 2022-07-20 NOTE — Plan of Care (Addendum)
Critcal Trops [id number] and repeat [id number].  Gen OR  and Cardio notified Patient still reports NO chest pain, difficulty breathing, unusual LF shoulder pain (has fallen on shoulder in past) or active nausea).  TOTALLY asymptomatic.   1730  Trop now 8837 Still critical, but (trending down).  Patient is still asymptomatic. However - output for shift has been minimal (Foley 60cc and colostomy 75cc.  General Surgery and Cardio informed  1750 - HR began sustaining 160-180 Rate.  Patient reported "I just dont feel well, but still no chest pain."  Intensivist ordered Amio bolus/drip  and hung.   1640 Trop now 11,207 - Patient still resporting "no chest pain" - Just nauseous and tired.  Intensitivist informed. 1x dose zofran ordered and given

## 2022-07-20 NOTE — Progress Notes (Signed)
Patient has decreased urine output, hypotensive, tachycardic.   Spoke to on call surgeon, see new orders.

## 2022-07-20 NOTE — Progress Notes (Signed)
Subjective:  CC: Sarah [name] is a 76 y.o. female  Hospital stay day 1, 1 Day Post-Op robotic sigmoidectomy converted to open Hartman's procedure  HPI: No complaints overnight.  Sore in abdomen as expected.  Noted to be hypotensive, tachycardic on floor so transferred to ICU For close monitoring.  Did not require pressors by the time patient arrived to ICU.  ROS:  General: Denies weight loss, weight gain, fatigue, fevers, chills, and night sweats. Heart: Denies chest pain, palpitations, racing heart, irregular heartbeat, leg pain or swelling, and decreased activity tolerance. Respiratory: Denies breathing difficulty, shortness of breath, wheezing, cough, and sputum. GI: Denies change in appetite, heartburn, nausea, vomiting, constipation, diarrhea, and blood in stool. GU: Denies difficulty urinating, pain with urinating, urgency, frequency, blood in urine.   Objective:   Temp:  [97.7 F (36.5 C)-99.3 F (37.4 C)] 98.1 F (36.7 C) (08/23 0300) Pulse Rate:  [39-123] 41 (08/23 0600) Resp:  [13-24] 21 (08/23 0600) BP: (79-111)/(44-73) 105/73 (08/23 0600) SpO2:  [90 %-100 %] 94 % (08/23 0600)     Height: '5\' 3"'$  (160 cm) Weight: 55.3 kg BMI (Calculated): 21.62   Intake/Output this shift:   Intake/Output Summary (Last 24 hours) at 07/20/2022 1440 Last data filed at 07/20/2022 0600 Gross per 24 hour  Intake 3100.53 ml  Output 785 ml  Net 2315.53 ml    Constitutional :  alert, cooperative, appears stated age, and no distress  Respiratory:  clear to auscultation bilaterally  Cardiovascular:  Tachycardic  Gastrointestinal: Soft, no guarding, generalized abdominal TTP as expected based on extent of surery . Ostomy pink, patent, stool in bag  Skin: Cool and moist. Incisions c/d/i  Psychiatric: Normal affect, non-agitated, not confused       LABS:     Latest Ref Rng & Units 07/20/2022    1:44 PM 07/20/2022    3:27 AM 06/05/2022   11:20 AM  CMP  Glucose 70 - 99 mg/dL  184  152   BUN 8  - 23 mg/dL  15  9   Creatinine 0.44 - 1.00 mg/dL  0.81  0.58   Sodium 135 - 145 mmol/L  140  137   Potassium 3.5 - 5.1 mmol/L 3.0  2.8  2.7   Chloride 98 - 111 mmol/L  112  99   CO2 22 - 32 mmol/L  21  27   Calcium 8.9 - 10.3 mg/dL  7.3  7.5   Total Protein 6.5 - 8.1 g/dL   6.4   Total Bilirubin 0.3 - 1.2 mg/dL   0.6   Alkaline Phos 38 - 126 U/L   52   AST 15 - 41 U/L   22   ALT 0 - 44 U/L   16       Latest Ref Rng & Units 07/20/2022   11:55 AM 07/20/2022    3:27 AM 06/05/2022   11:20 AM  CBC  WBC 4.0 - 10.5 K/uL  5.4  8.1   Hemoglobin 12.0 - 15.0 g/dL 9.4  9.6  10.9   Hematocrit 36.0 - 46.0 % 30.6  30.8  35.1   Platelets 150 - 400 K/uL  256  247     RADS: N/a Assessment:   S/p robotic sigmoidectomy converted to open Hartman's procedure for complicated diverticulitis.  GI exam as expected at this point.  Persistent tachycardia post bolus x 2, EKG with possible ischemic changes?  Pending echo and full cards consult.  She remains void of any typical cardiac symptoms.  However, increasing troponins is of concern.  Hgb stable and JP with serosanguinous fluid, so doubt any active bleed, but will like to hold off anticoag for another day if possible.  Pt high risk of intra-abdominal infection formation after yesterday's case.  Will continue antibiotics and monitor closely.  Appreciate cards and ICU input as well.   labs/images/medications/previous chart entries reviewed personally and relevant changes/updates noted above.

## 2022-07-20 NOTE — Consult Note (Addendum)
[facility] NOTE       Patient ID: Sarah [name] MRN: [id number] DOB/AGE: 76-Feb-1947 76 y.o.  Admit date: 07/19/2022 Referring Physician Donell Beers, NP  Primary Physician Dr. Sabra Heck Primary Cardiologist Dr. Saralyn Pilar  Reason for Consultation elevated troponin & abnormal EKG, concern for perioperative NSTEMI  HPI: Sarah [name] is a 76yoF with a PMH of hypertension, Graves' disease s/p XRT, type 2 diabetes, IBS, diverticulitis with abscess formation s/p CT-guided drain placement in early June 2023 who presented to [facility] for scheduled laparoscopic sigmoidectomy on 07/19/2022.  The evening following the procedure she reportedly became hypotensive and tachycardic with SBP in the 90s.  CCM was consulted and the patient was transferred to the ICU early morning of 8/23.  Her blood pressure improved on recheck with systolic in the low 762G and a MAP of 80.  She did not require vasopressor support and was actually given her home antihypertensives on 8/23.  Cardiology is consulted the afternoon of 8/23 due to concern for an abnormal EKG and elevated troponin to 7000, concern for perioperative NSTEMI.  Per chart review and nursing report, the patient was scheduled for laparoscopic robotic assisted sigmoid colectomy on 8/22, ultimately converted to an open procedure and she is now s/p colostomy placement.  She was transferred to the ICU overnight due to concern for hypotension with documented blood pressure as low as systolic in the 31D.  She was stable overnight and blood pressures this morning were in the 176 systolic.  A troponin was checked this morning which was significantly elevated initially at 7000, repeat 2 hours later at 9500.  Initial EKG 10:39a show sinus tachycardia with rate 118 with premature atrial contractions and premature ventricular contractions, nonspecific ST changes with poor R wave progression, and repeat 12:46 PM similar to prior.  At my time of evaluation this  afternoon, the patient says she feels sleepy, has some expected abdominal pain, and some burning in her throat after taking aspirin.  I confirmed with her she did not have this throat burning prior to taking aspirin, nor did she have this yesterday evening or preoperatively.  She denies chest discomfort, nausea, shortness of breath, palpitations, dizziness, orthopnea, lower extremity edema.  Her blood pressure is 103/47, and she is in sinus tachycardia with frequent premature atrial contractions with rate in the 120s on telemetry (the monitor is incorrectly reading atrial fibrillation).  Labs are notable for persistent hypokalemia with potassium 3.0, magnesium 2.1 after IV replacement, H&H stable at 9.4/30.6.  High-sensitivity troponin fairly elevated at 7000, 9494 on repeat with more repeats pending.  She was previously followed by Clabe Seal, PA-C and Dr. Saralyn Pilar, most recently seen in December 2022, originally referred to cardiology for irregular heart rhythm on EKG that was noted prior to cardiac surgery.  She had a 48-hour Holter monitor that revealed predominantly sinus rhythm with frequent PVCs, couplets, and triplets in 2018.  Notably, she has had a normal stress echocardiogram in 2018, which showed preserved LVEF.  She has never had a heart catheterization before.  She is a non-smoker, nondrinker, denies illicit substance use.  Review of systems complete and found to be negative unless listed above     Past Medical History:  Diagnosis Date   Arthritis    hands   Cancer ([place])    thyroid- radiation   Diabetes mellitus without complication ([place])    Hyperlipidemia    Hypertension    Hypothyroidism    IBS (irritable bowel syndrome)  Past Surgical History:  Procedure Laterality Date   ABDOMINAL HYSTERECTOMY     CATARACT EXTRACTION W/PHACO Left 12/21/2016   Procedure: CATARACT EXTRACTION PHACO AND INTRAOCULAR LENS PLACEMENT ([place])  left eye;  Surgeon: Leandrew Koyanagi, MD;  Location:  [facility];  Service: Ophthalmology;  Laterality: Left;  Diabetic - oral meds   CATARACT EXTRACTION W/PHACO Right 01/25/2017   Procedure: CATARACT EXTRACTION PHACO AND INTRAOCULAR LENS PLACEMENT ([place])  right diabetic;  Surgeon: Leandrew Koyanagi, MD;  Location: [facility];  Service: Ophthalmology;  Laterality: Right;  diabetic - oral meds   CHOLECYSTECTOMY     COLONOSCOPY WITH PROPOFOL N/A 10/26/2016   Procedure: COLONOSCOPY WITH PROPOFOL;  Surgeon: Manya Silvas, MD;  Location: [facility] ENDOSCOPY;  Service: Endoscopy;  Laterality: N/A;   IR RADIOLOGIST EVAL & MGMT  06/14/2022   KNEE SURGERY Right     Medications Prior to Admission  Medication Sig Dispense Refill Last Dose   cyanocobalamin 1000 MCG tablet Take 1,000 mcg by mouth daily.   07/18/2022   glimepiride (AMARYL) 4 MG tablet Take 4 mg by mouth daily with breakfast.   07/18/2022   levothyroxine (SYNTHROID) 150 MCG tablet Take 150 mcg by mouth daily before breakfast.   07/19/2022 at 0730   metFORMIN (GLUCOPHAGE) 500 MG tablet Take 500 mg by mouth 2 (two) times daily with a meal.      metoprolol succinate (TOPROL-XL) 50 MG 24 hr tablet Take 50 mg by mouth daily. Take with or immediately following a meal.   07/19/2022 at 0730   Olmesartan-Amlodipine-HCTZ 40-10-25 MG TABS Take 1 tablet by mouth 3 (three) times a week. Mon, Wed, Fri   07/19/2022 at 0730   ondansetron (ZOFRAN-ODT) 4 MG disintegrating tablet Take 1 tablet (4 mg total) by mouth every 8 (eight) hours as needed for nausea or vomiting. 12 tablet 0 07/19/2022 at 0200   potassium chloride (K-DUR) 10 MEQ tablet Take 10 mEq by mouth daily.   07/18/2022   sertraline (ZOLOFT) 100 MG tablet Take 100 mg by mouth daily.      simvastatin (ZOCOR) 20 MG tablet Take 20 mg by mouth daily.   Past Week   venlafaxine (EFFEXOR) 37.5 MG tablet Take 37.5 mg by mouth daily.   07/19/2022 at 0730   sodium chloride flush (NS) 0.9 % SOLN 5 mLs by Intracatheter route every 8 (eight) hours. 1350  mL 0    Social History   Socioeconomic History   Marital status: Married    Spouse name: Not on file   Number of children: Not on file   Years of education: Not on file   Highest education level: Not on file  Occupational History   Not on file  Tobacco Use   Smoking status: Never   Smokeless tobacco: Never   Tobacco comments:    social as teenager  Vaping Use   Vaping Use: Never used  Substance and Sexual Activity   Alcohol use: No   Drug use: No   Sexual activity: Not on file  Other Topics Concern   Not on file  Social History Narrative   Not on file   Social Determinants of Health   Financial Resource Strain: Not on file  Food Insecurity: Not on file  Transportation Needs: Not on file  Physical Activity: Not on file  Stress: Not on file  Social Connections: Not on file  Intimate Partner Violence: Not on file    Family History  Problem Relation Age of Onset   Breast  cancer Neg Hx       PHYSICAL EXAM General: Elderly and frail-appearing Caucasian female, in no acute distress, sitting in incline in ICU bed.   HEENT:  Normocephalic and atraumatic. Neck:  No JVD.  Lungs: Normal respiratory effort on room air. Clear bilaterally to auscultation. No wheezes, crackles, rhonchi.  Heart: Tachycardic with frequent early beats. Normal S1 and S2 without gallops or murmurs.  Abdomen: Non-distended appearing.  Vertical midline scar present with honeycomb dressing.  Colostomy present LLQ. Msk: Normal strength and tone for age. Extremities: Warm and well perfused. No clubbing, cyanosis.  No peripheral edema.  Neuro: Alert and oriented X 3. Psych:  Answers questions appropriately.   Labs: Basic Metabolic Panel: Recent Labs    07/20/22 0327  NA 140  K 2.8*  CL 112*  CO2 21*  GLUCOSE 184*  BUN 15  CREATININE 0.81  CALCIUM 7.3*  MG 1.5*   Liver Function Tests: No results for input(s): "AST", "ALT", "ALKPHOS", "BILITOT", "PROT", "ALBUMIN" in the last 72 hours. No  results for input(s): "LIPASE", "AMYLASE" in the last 72 hours. CBC: Recent Labs    07/20/22 0327 07/20/22 1155  WBC 5.4  --   HGB 9.6* 9.4*  HCT 30.8* 30.6*  MCV 88.8  --   PLT 256  --    Cardiac Enzymes: Recent Labs    07/20/22 1155  TROPONINIHS 6,999*   BNP: Invalid input(s): "POCBNP" D-Dimer: No results for input(s): "DDIMER" in the last 72 hours. Hemoglobin A1C: No results for input(s): "HGBA1C" in the last 72 hours. Fasting Lipid Panel: No results for input(s): "CHOL", "HDL", "LDLCALC", "TRIG", "CHOLHDL", "LDLDIRECT" in the last 72 hours. Thyroid Function Tests: No results for input(s): "TSH", "T4TOTAL", "T3FREE", "THYROIDAB" in the last 72 hours.  Invalid input(s): "FREET3" Anemia Panel: No results for input(s): "VITAMINB12", "FOLATE", "FERRITIN", "TIBC", "IRON", "RETICCTPCT" in the last 72 hours.  No results found.   Radiology: No results found.  ECHO (stress echo) 02/14/2017  Ref Range & Units 5 yr ago  LV Ejection Fraction (%)  55   Aortic Valve Regurgitation Grade  none   Aortic Valve Stenosis Grade  none   Mitral Valve Regurgitation Grade  trivial   Mitral Valve Stenosis Grade  none   Tricuspid Valve Regurgitation Grade  trivial   Tricuspid Valve Regurgitation Max Velocity (m/s) m/sec 1.4   Right Ventricle Systolic Pressure (mmHg) mmHg 12.4   Narrative                     CARDIOLOGY DEPARTMENT                   [place], [place]                      [id number]                    [facility]                 Acct #: [id number]          [address] [place], [place] [address]       Date: 02/14/2017 02:57 PM  Adult Female Age: 73 yrs                     ECHOCARDIOGRAM REPORT                   Outpatient                                                             KC::KCWC            STUDY:Stress Echo        TAPE:                   MD1:  DRANE, ANNA  MARIA             ECHO:Yes   DOPPLER:Yes  FILE:0000-000-000       BP: 148/83 mmHg            COLOR:Yes  CONTRAST:No      MACHINE:[place]      Height: 63 in        RV BIOPSY:No         3D:No   SOUND QLTY:Moderate     Weight: 174 lb           MEDIUM:None                                       BSA: 1.8 m2   ___________________________________________________________________________________________     HISTORY:Chest pain      REASON:Assess, LV function  Indication:R07.89 Other chest pain, I10 Essential (primary) hypertension, R06.02             Shortness of breath  ___________________________________________________________________________________________   STRESS ECHOCARDIOGRAPHY            Protocol:Treadmill ([place])              Drugs:None  Target Heart Rate:128 bpm        Maximum Predicted Heart Rate: 150 bpm   +-------------------+-------------------------+-------------------------+------------+         Stage            Duration (mm:ss)         Heart Rate (bpm)         BP       +-------------------+-------------------------+-------------------------+------------+        RESTING                                           85              148/83     +-------------------+-------------------------+-------------------------+------------+        EXERCISE                4:49                     210                /        +-------------------+-------------------------+-------------------------+------------+        RECOVERY                8:05  94              176/88     +-------------------+-------------------------+-------------------------+------------+     Stress Duration:4:49 mm:ss    Max Stress H.R.:210 bpm        Target Heart Rate Achieved: Yes   ___________________________________________________________________________________________   WALL SEGMENT CHANGES                         Rest          Stress  Anterior Septum Basal:Normal         Hyperkinetic                    GNF:AOZHYQ        Hyperkinetic                 Apical:Normal        Hyperkinetic     Anterior Wall Basal:Normal        Hyperkinetic                    MVH:QIONGE        Hyperkinetic                 Apical:Normal        Hyperkinetic      Lateral Wall Basal:Normal        Hyperkinetic                    XBM:WUXLKG        Hyperkinetic                 Apical:Normal        Hyperkinetic    Posterior Wall Basal:Normal        Hyperkinetic                    MWN:UUVOZD        Hyperkinetic     Inferior Wall Basal:Normal        Hyperkinetic                    GUY:QIHKVQ        Hyperkinetic                 Apical:Normal        Hyperkinetic   Inferior Septum Basal:Normal        Hyperkinetic                    QVZ:DGLOVF        Hyperkinetic              Resting EF:>55% (Est.)   Stress EF: >55% (Est.)   ___________________________________________________________________________________________   ADDITIONAL FINDINGS    ___________________________________________________________________________________________   STRESS ECG RESULTS                                                ECG Results:Normal   ___________________________________________________________________________________________  ECHOCARDIOGRAPHIC DESCRIPTIONS   LEFT VENTRICLE         Size:Normal  Contraction:Normal    LV Masses:No Masses          IEP:PIRJ   RIGHT VENTRICLE         Size:Normal                Free Wall:Normal  Contraction:Normal  RV Masses:No mass   PERICARDIUM        Fluid:No effusion    _______________________________________________________________________________________  DOPPLER ECHO and OTHER SPECIAL PROCEDURES     Aortic:No AR                      No AS      Mitral:TRIVIAL MR                 No MS            MV Inflow E Vel=nm*        MV Annulus E'Vel=nm*            E/E'Ratio=nm*   Tricuspid:TRIVIAL TR                 No TS            136.0 cm/sec  peak TR vel   12.4 mmHg peak RV pressure   Pulmonary:No PR                      No PS     ___________________________________________________________________________________________  ECHOCARDIOGRAPHIC MEASUREMENTS  2D DIMENSIONS  AORTA             Values      Normal Range      MAIN PA          Values      Normal Range            Annulus:  nm*       [2.1 - 2.5]                PA Main:  nm*       [1.5 - 2.1]          Aorta Sin:  nm*       [2.7 - 3.3]       RIGHT VENTRICLE        ST Junction:  nm*       [2.3 - 2.9]                RV Base:  nm*       [ < 4.2]          Asc.Aorta:  nm*       [2.3 - 3.1]                 RV Mid:  nm*       [ < 3.5]   LEFT VENTRICLE                                        RV Length:  nm*       [ < 8.6]              LVIDd:  nm*       [3.9 - 5.3]       INFERIOR VENA CAVA              LVIDs:  nm*                                 Max. IVC:  nm*       [ <= 2.1]                 FS:  nm*       [>  25]                    Min. IVC:  nm*                SWT:  nm*       [0.5 - 0.9]                   ------------------                PWT:  nm*       [0.5 - 0.9]                   nm* - not measured   LEFT ATRIUM            LA Diam:  nm*       [2.7 - 3.8]        LA A4C Area:  nm*       [ < 20]          LA Volume:  nm*       [22 - 52]    ___________________________________________________________________________________________  INTERPRETATION  Normal Stress Echocardiogram  NORMAL RIGHT VENTRICULAR SYSTOLIC FUNCTION  TRIVIAL REGURGITATION NOTED (See above)  NO VALVULAR STENOSIS NOTED    ___________________________________________________________________________________________  Electronically signed by: MD Miquel Dunn on 02/15/2017 10:07 AM              Performed By: Johnathan Hausen, RDCS, RVT        Ordering Physician: Clabe Seal   TELEMETRY reviewed by me (LT) 07/20/2022 : Sinus tachycardia with frequent premature atrial contractions and occasional premature  ventricular contractions, rate 120s  EKG reviewed by me and Dr. Clayborn Bigness: 8/23 sinus tachycardia with rate 118 nonspecific T wave changes, repeat at   Data reviewed by me (LT) 07/20/2022: Admission H&P, surgery progress note, critical care progress note, troponin, CBC, BMP, telemetry  ASSESSMENT AND PLAN:  Sarah [name] is a 18yoF with a PMH of hypertension, Graves' disease s/p XRT, type 2 diabetes, IBS, diverticulitis with abscess formation s/p CT-guided drain placement in early June 2023 who presented to [facility] for scheduled laparoscopic sigmoidectomy on 07/19/2022.  The evening following the procedure she reportedly became hypotensive and tachycardic with SBP in the 90s.  CCM was consulted and the patient was transferred to the ICU early morning of 8/23.  Her blood pressure improved on recheck with systolic in the low 211H and a MAP of 80.  She did not require vasopressor support and was actually given her home antihypertensives on 8/23.  Cardiology is consulted the afternoon of 8/23 due to concern for an abnormal EKG and elevated troponin to 7000, concern for perioperative NSTEMI.  #Diverticulitis s/p robotic sigmoidectomy converted to open Hartman's procedure on 07/19/22 #Elevated troponin concern for perioperative NSTEMI Underwent scheduled robotic sigmoidectomy for diverticulitis on 8/22 with Dr. Lysle Pearl, the surgery ultimately was converted to an open Hartman's procedure and she is now s/p colostomy.  Overnight on the floor she became hypotensive and tachycardic, and was transferred to the ICU for closer monitoring.  Ultimately she did not require vasopressor support and blood pressures been stable throughout the day.  A troponin was checked which was significantly elevated at 7000, and repeat at 9500.  EKGs read as acute MI by the computer, confirmed with Dr. Clayborn Bigness not to be a STEMI, but with nonspecific T wave changes and poor R wave progression, overall not significantly changed from prior in July  2023. -Agree with  current therapy per surgery -Trend troponin until peak -We will give 325 mg aspirin, then 81 mg aspirin daily thereafter -Defer therapeutic anticoagulation with heparin ideally until tomorrow 8/24 per Dr. Lysle Pearl, would favor giving 48 hours of heparin -Obtain echocardiogram complete -Continue metoprolol XL 50 mg once daily, can uptitrate as her blood pressure allows -Initiate atorvastatin 40 mg once daily -We will continue home irbesartan but at reduced dose of 150 mg daily -Discontinue home amlodipine 10 mg and HCTZ 12.5, can add back as blood pressure allows -Monitor and replete electrolytes for a magnesium >2, potassium >4 -Check lipid panel in the morning  -Previous from 02/2022: TC 189, HDL 51, LDL 103, VLDL 36 -Continuous monitoring on telemetry while inpatient -We will consider further ischemic work-up pending clinical course, but favor conservative management from a cardiac standpoint, per Dr. Clayborn Bigness  This patient's plan of care was discussed and created with Dr. Clayborn Bigness and he is in agreement.  Signed: Tristan Schroeder , PA-C 07/20/2022, 1:41 PM [facility]

## 2022-07-20 NOTE — Anesthesia Postprocedure Evaluation (Signed)
Anesthesia Post Note  Patient: Sarah [name]  Procedure(s) Performed: XI ROBOT ASSISTED SIGMOID COLECTOMY converted to open hartmans procedure (Abdomen)  Patient location during evaluation: ICU Anesthesia Type: General Level of consciousness: awake Pain management: satisfactory to patient Respiratory status: spontaneous breathing Anesthetic complications: no   No notable events documented.   Last Vitals:  Vitals:   07/20/22 0530 07/20/22 0600  BP: 108/62 105/73  Pulse: (!) 39 (!) 41  Resp: 20 (!) 21  Temp:    SpO2: 95% 94%    Last Pain:  Vitals:   07/20/22 0300  TempSrc: Oral  PainSc: 3                  Lerry Liner

## 2022-07-20 NOTE — Consult Note (Signed)
WOC consulted for new colostomy, will follow up Thursday or Friday for first post op assessment and teaching when she is more medically stable.   Noted patient appears to be primary CG for husband, will evaluate any support for her at home that needs to be involved in teaching.   [place] Nurse will follow along with you for continued support with ostomy teaching and care [place] MSN, RN, [place], [place], [place]

## 2022-07-20 NOTE — Progress Notes (Signed)
Progress Note   Pt developed new onset atrial fibrillation with rvr at 1740 heart rate 130-180's.  Amiodarone bolus followed by continuous amiodarone gtt administered.  Pts troponin's trending back up to 11,207 unable to anticoagulate pt s/p robotic sigmoidectomy converted to open Hartman's procedure.  Notified Dr. Clayborn Bigness via secure chat in epic at [phone].  Additional Critical Care Time: 30 minutes   Donell Beers, [place] Pager [phone] (please enter 7 digits) PCCM Consult Pager [phone] (please enter 7 digits)

## 2022-07-20 NOTE — Op Note (Signed)
Preoperative diagnosis: chronic diverticulitis Postoperative diagnosis: Same  Procedure: Robotic assisted laparoscopic sigmoidectomy, converted to open hartman's procedure   Anesthesia: GETA   Surgeon: Benjamine Sprague Assistant: Cintron for exposure and bedside assist    Wound Classification: contaminated   Specimen: Sigmoid colon   Complications: None   Estimated Blood Loss: 100 mL  Indications: Please see H&P for further details.     FIndings: 1.  chronic diverticulitis with persistent abscess and extensive scar tissue and bowel wall thickening 2. colon full of stool, grossly un prepped bowel 3.  Unsuccessful EEA anastomosis with extensive laceration at staple line, requiring converting to open hartman's procedure  2.  Normal anatomy 3.  Adequate hemostasis.    Description of procedure: The patient was placed on the operating table in the low lithotomy position, both arms tucked. General anesthesia was induced.  Foley placed. A time-out was completed verifying correct patient, procedure, site, positioning, and implant(s) and/or special equipment prior to beginning this procedure. The abdomen was prepped and draped in the usual sterile fashion.    Veress needle placed in Palmer's point due to previous abdominal surgeries. After confirming two clicks, and positive saline drop test, abdomen insufflated to 21m Hg gradually.  Veress needle removed and 825mport placed in LUQ via optiview technique.  No injuries noted during placement.  Examination abdominal cavity noted extensive omental adhesions to the anterior abdominal wall.  Fortunately, 2 additional 51m82morts were able to be placed in the periumbilical and right lower quadrant region under direct visualization.  Laparoscopic graspers and scissors was then used to remove the adhesions to make room for the far right lower quadrant 12 mm port.  Additional lysis of adhesions was then performed to clear the view of the left lower quadrant with  laparoscopic instruments.  Patient placed in Trendelenburg and right side down prior to docking the [employer] infused as a tap block prior to docking as well.   Thickened sigmoid colon wound is noted to be attached to the lateral abdominal wall where the prior drain site was located.  Meticulous dissection using scissors and gentle traction was used to enter a very large abscess cavity that was drained completely. Infection present within abdomen due to large abscess.  The abscess cavity along with the adhesed sigmoid colon was then separated from the lateral abdominal wall down to less thickened rectum.  Medial to lateral dissection of the mesocolon was then performed with a combination of scissors and electrocautery, to the area of normal looking proximal colon.   This portion of the procedure was somewhat hindered by the very enlarged colon noted to be full of stool along with the enlarged cecum that took some time to be retracted out of the operative field for adequate visualization along with the small bowel.  Dissection along the lateral aspect was further hindered by the extensively thickened peritoneum around the former abscess cavity. Area proximal to the thickened colon wall was chosen for anvil insertion site.  Additional dissection was carried out approximately around the splenic flexure and the mesocolon dissection continued as well where the left colic artery and inferior mesenteric vein was ligated using the vessel sealer.  This allowed adequate mobilization of the entire colon in preparation for the anastomosis.  Additional omental adhesions were removed in order to facilitate this portion of the procedure.  Care was noted to ensure no excess bleeding and no spillage of bowel contents.  The diseased portion of the sigmoid colon was then  transected at both the proximal and distal ends using green load Endo GIA stapler.  Robot undocked and infraumbilical midline incision made through  the abdominal wall to enter the abdominal cavity.  Sigmoid colon removed from abdominal cavity, passed off operative field pending pathology.  Disposable pursestring device was then used to transect the proximal staple line, and the 29 mm anvil was placed through this and secured to the colon.  Small amount of stool spillage was controlled with suction and toweling off the area, as well as preemptively suctioning out the colon contents prior to placing the anvil.  A small portion of the colon wall was noted to have a tear along the edge of the anvil.this was secured and pulled towards the center of the anvil using 3-0 Vicryl x1.  Combination of cautery and LigaSure was used to further clean off the edges of the anvil in preparation for the end-to-end anastomosis.  EEA stapler placed through the rectal stump and guided to the distal staple line and a end to end anastomosis was created, after confirming no twisting of the proximal mesentery and no tension noted on the staple line.  During retraction of the stapler itself, the rectal stump was noted to retract towards the pelvis, causing some tension on the staple line.  The staple line was held in place to minimize tension to the area while additional dissection was carried out in the proximal end at the mesentery with hand-held ligature to allow additional slack.  Unfortunately, large amount of stool was noted to start leaking from the staple line at this time.  Extensive irrigation and suctioning was done to clear out all the stool within the pelvis.  Inspection of the staple line noted a large laceration proximal to the staple line.  Decision made at this point to convert to a Hartman's procedure due to the extensive contamination as well as the damage to the colon.  The lacerated portion of the colon was removed with electrocautery and the rectal stump was oversewn with 3 OV lock.  Normal and closed with 3-0 Vicryl temporarily.  Sigmoidoscope placed through the  rectum and air leak test was done to ensure the stump was oversewn appropriately.  38 [place] Blake drain then pushed through the RLQ port and placed over the staple line, secured to the skin using 3-0 nylon   Abdomen then extensively irrigated again and suction out until clear fluid noted. Area of LLQ  chosen for colostomy site.  Small disc of skin removed and dissection carried down to fascia.  Cruciate incision created to easily fit 2 fingers through the proximal colon and was brought to the ostomy under minimal tension no twisting.  Midline then closed with 1 PDS x2.  3-0 Vicryl used to approximate the subcutaneous tissue prior to closing midline and all port skin sites with staples, then dressed with honeycomb dressing.  JP drain site dressed with drain sponge and paper tape.  Ostomy was then matured by removing the previously used 3-0 Vicryl and secured to the skin using interrupted 3-0 Vicryl.  Stool output was minimal by this point and the excess stooling was suctioned out of the lumen prior to any spillage.  Ostomy appliance then applied over the healthy looking ostomy.   The patient tolerated the procedure well, awakened from anesthesia and was taken to the postanesthesia care unit in satisfactory condition with Foley in place.  Sponge count correct at end of procedure.

## 2022-07-20 NOTE — Progress Notes (Signed)
*  PRELIMINARY RESULTS* Echocardiogram 2D Echocardiogram has been performed.  Sarah [name] 07/20/2022, 2:53 PM

## 2022-07-20 NOTE — Consult Note (Incomplete)
NAME:  Sarah [name], MRN:  [id number], DOB:  1946/10/04, LOS: 1 ADMISSION DATE:  07/19/2022, CONSULTATION DATE:  07/20/22 REFERRING MD:  Benjamine Sprague, DO  REASON FOR CONSULT: Hypotension    HPI  Sarah [name] is a 76yoF with a PMH of hypertension, Graves' disease s/p XRT, type 2 diabetes, IBS, diverticulitis with abscess formation s/p CT-guided drain placement in early June 2023 who presented to [facility] for scheduled laparoscopic sigmoidectomy on 07/19/2022.  Hospital Course: Patient underwent robotic assisted laparoscopic sigmoidectomy, converted to open hartman's procedure. Patient was transferred to medical floor post -op. Overnight patient noted to have decreased urine output, hypotensive and tachycardic.  Due to persistent hypotension despite IV fluid resuscitation, patient was started on peripheral Levophed and transferred to the ICU.  PCCM consulted to assist with management. On arrival to the ICU, vital signs showed blood pressure of 98/60 mm Hg, tachycardia of 115 beats per minute, a respiratory rate of 22 breaths per minute, and a temperature of 98.33F (36.7.C).  Patient remained hemodynamically stable not requiring pressors.   Repeat Pertinent Labs Findings: Chemistry:Na+/ K+: 140/2.8 (142/3.3) Glucose:184  BUN/Cr.:23/1.14 (24/1.16) CBC: WBC:5.4 Hgb/Hct:9.6/30.8:  Other Lab findings:   PCT: 1.39 Lactic acid: 3.4 COVID PCR: Negative, Troponin:  6.999>9.494>8.837>11,207 EKG:  sinus tachycardia with rate 118 with premature atrial contractions and premature ventricular contractions, nonspecific ST changes with poor R wave progression.  Pt developed new onset atrial fibrillation with rvr at 1740 heart rate 130-180's.  Amiodarone bolus followed by continuous amiodarone gtt administered.  Pts troponin's trending back up to 11,207 unable to anticoagulate pt s/p robotic sigmoidectomy converted to open Hartman's procedure. Cardiology was consulted. Initially holding heparin per surgery however, patient  noted with worsening symptoms of nausea, Afib RVR with HR's in the 170's despite being on Amiodarone. She was more lethargic drifting in and out of somnolence. Decision made to restart Heparin after further discussion with General surgery due to high risk for decompensation and intubation. PCCM re-consulted.  Past Medical History  Hypertension, Graves' disease s/p XRT, type 2 diabetes, IBS, diverticulitis with abscess formation s/p CT-guided drain placement in early June 2023  [place] [facility] Events   8/22:S/p robotic sigmoidectomy converted to open Hartman's procedure for complicated diverticulitis. 8/23: Transferred to ICU for hypotension. PCCM consulted for pressor.  Developed Afib RVR with elevated troponiN concerns for NSTEMI. Cardiology consulted   Consults:  PCCM Cardiology  Procedures:  8/22:  Significant Diagnostic Tests:  8/24: Chest Xray>No acute abnormality 8/24: Abdominal xray>No acute abnormality  Micro Data:  8/24: Blood culture x2> 8/24: Urine Culture> 8/24: MRSA PCR>>   Antimicrobials:  Vancomycin 8/24> Ceftriaxone 8/22> Metronidazole 8/22 >  OBJECTIVE  Blood pressure (!) 99/54, pulse (!) 110, temperature 98.1 F (36.7 C), temperature source Oral, resp. rate 18, height 5' 2.99" (1.6 m), weight 55.3 kg, SpO2 100 %.       Intake/Output Summary (Last 24 hours) at 07/20/2022 2039 Last data filed at 07/20/2022 1600 Gross per 24 hour  Intake 1750.53 ml  Output 555 ml  Net 1195.53 ml   Filed Weights   07/19/22 1033  Weight: 55.3 kg   Physical Examination  GENERAL: 76 year-old critically ill patient lying in the bed with no acute distress.  EYES: Pupils equal, round, reactive to light and accommodation. No scleral icterus. Extraocular muscles intact.  HEENT: Head atraumatic, normocephalic. Oropharynx and nasopharynx clear.  NECK:  Supple, no jugular venous distention. No thyroid enlargement, no tenderness.  LUNGS: Normal breath sounds bilaterally, no  wheezing, rales,rhonchi or crepitation. No use of accessory muscles of respiration.  CARDIOVASCULAR: S1, S2 normal. No murmurs, rubs, or gallops.  ABDOMEN: Soft, tender, nondistended. Bowel sounds present. No organomegaly or mass. Ostomy pink, patent, stool in bag EXTREMITIES: No pedal edema, cyanosis, or clubbing.  NEUROLOGIC: Cranial nerves II through XII are intact.  Muscle strength 5/5 in all extremities. Sensation intact. Gait not checked.  PSYCHIATRIC: The patient is alert and oriented x 3.  SKIN: No obvious rash, lesion, or ulcer.   Labs/imaging that I havepersonally reviewed  (right click and "Reselect all SmartList Selections" daily)     Labs   CBC: Recent Labs  Lab 07/20/22 0327 07/20/22 1155  WBC 5.4  --   HGB 9.6* 9.4*  HCT 30.8* 30.6*  MCV 88.8  --   PLT 256  --     Basic Metabolic Panel: Recent Labs  Lab 07/20/22 0327 07/20/22 1344 07/20/22 1742 07/20/22 1808  NA 140  --  142  --   K 2.8* 3.0* 3.2* 3.2*  CL 112*  --  112*  --   CO2 21*  --  19*  --   GLUCOSE 184*  --  149*  --   BUN 15  --  23  --   CREATININE 0.81  --  1.14*  --   CALCIUM 7.3*  --  7.4*  --   MG 1.5* 2.1  --   --   PHOS  --   --   --  3.4   GFR: Estimated Creatinine Clearance: 34.7 mL/min (A) (by C-G formula based on SCr of 1.14 mg/dL (H)). Recent Labs  Lab 07/20/22 0327  WBC 5.4    Liver Function Tests: No results for input(s): "AST", "ALT", "ALKPHOS", "BILITOT", "PROT", "ALBUMIN" in the last 168 hours. No results for input(s): "LIPASE", "AMYLASE" in the last 168 hours. No results for input(s): "AMMONIA" in the last 168 hours.  ABG No results found for: "PHART", "PCO2ART", "PO2ART", "HCO3", "TCO2", "ACIDBASEDEF", "O2SAT"   Coagulation Profile: No results for input(s): "INR", "PROTIME" in the last 168 hours.  Cardiac Enzymes: No results for input(s): "CKTOTAL", "CKMB", "CKMBINDEX", "TROPONINI" in the last 168 hours.  HbA1C: Hgb A1c MFr Bld  Date/Time Value Ref Range  Status  05/30/2022 03:14 AM 8.3 (H) 4.8 - 5.6 % Final    Comment:    (NOTE)         Prediabetes: 5.7 - 6.4         Diabetes: >6.4         Glycemic control for adults with diabetes: <7.0     CBG: Recent Labs  Lab 07/20/22 0317 07/20/22 0814 07/20/22 1131 07/20/22 1815 07/20/22 2011  GLUCAP 180* 161* 128* 155* 147*    Review of Systems:   Review of Systems  Constitutional:  Positive for fever, malaise/fatigue and weight loss. Negative for chills and diaphoresis.  HENT: Negative.    Eyes: Negative.   Respiratory:  Positive for shortness of breath. Negative for cough, hemoptysis, sputum production and wheezing.   Cardiovascular:  Positive for chest pain and palpitations.  Gastrointestinal:  Positive for abdominal pain, blood in stool, diarrhea, nausea and vomiting. Negative for constipation, heartburn and melena.  Genitourinary: Negative.   Musculoskeletal: Negative.   Skin: Negative.   Neurological: Negative.   Endo/Heme/Allergies: Negative.   Psychiatric/Behavioral: Negative.     Past Medical History  She,  has a past medical history of Arthritis, Cancer ([place]), Diabetes mellitus without complication ([place]), Hyperlipidemia, Hypertension, Hypothyroidism, and  IBS (irritable bowel syndrome).   Surgical History    Past Surgical History:  Procedure Laterality Date   ABDOMINAL HYSTERECTOMY     CATARACT EXTRACTION W/PHACO Left 12/21/2016   Procedure: CATARACT EXTRACTION PHACO AND INTRAOCULAR LENS PLACEMENT ([place])  left eye;  Surgeon: Leandrew Koyanagi, MD;  Location: [facility];  Service: Ophthalmology;  Laterality: Left;  Diabetic - oral meds   CATARACT EXTRACTION W/PHACO Right 01/25/2017   Procedure: CATARACT EXTRACTION PHACO AND INTRAOCULAR LENS PLACEMENT ([place])  right diabetic;  Surgeon: Leandrew Koyanagi, MD;  Location: [facility];  Service: Ophthalmology;  Laterality: Right;  diabetic - oral meds   CHOLECYSTECTOMY     COLONOSCOPY WITH PROPOFOL N/A  10/26/2016   Procedure: COLONOSCOPY WITH PROPOFOL;  Surgeon: Manya Silvas, MD;  Location: [facility] ENDOSCOPY;  Service: Endoscopy;  Laterality: N/A;   IR RADIOLOGIST EVAL & MGMT  06/14/2022   KNEE SURGERY Right      Social History   reports that she has never smoked. She has never used smokeless tobacco. She reports that she does not drink alcohol and does not use drugs.   Family History   Her family history is negative for Breast cancer.   Allergies Allergies  Allergen Reactions   Penicillins Anaphylaxis    TOLERATED CEFOTETAN 06/2022   Sulfa Antibiotics Rash     Home Medications  Prior to Admission medications   Medication Sig Start Date End Date Taking? Authorizing Provider  cyanocobalamin 1000 MCG tablet Take 1,000 mcg by mouth daily.   Yes [provider]  glimepiride (AMARYL) 4 MG tablet Take 4 mg by mouth daily with breakfast. 09/29/21 09/29/22 Yes [provider]  levothyroxine (SYNTHROID) 150 MCG tablet Take 150 mcg by mouth daily before breakfast.   Yes [provider]  metFORMIN (GLUCOPHAGE) 500 MG tablet Take 500 mg by mouth 2 (two) times daily with a meal.   Yes [provider]  metoprolol succinate (TOPROL-XL) 50 MG 24 hr tablet Take 50 mg by mouth daily. Take with or immediately following a meal.   Yes [provider]  Olmesartan-Amlodipine-HCTZ 40-10-25 MG TABS Take 1 tablet by mouth 3 (three) times a week. Mon, Wed, Fri   Yes [provider]  ondansetron (ZOFRAN-ODT) 4 MG disintegrating tablet Take 1 tablet (4 mg total) by mouth every 8 (eight) hours as needed for nausea or vomiting. 06/05/22  Yes Arta Silence, MD  potassium chloride (K-DUR) 10 MEQ tablet Take 10 mEq by mouth daily.   Yes [provider]  sertraline (ZOLOFT) 100 MG tablet Take 100 mg by mouth daily.   Yes [provider]  simvastatin (ZOCOR) 20 MG tablet Take 20 mg by mouth daily.   Yes [provider]  venlafaxine  (EFFEXOR) 37.5 MG tablet Take 37.5 mg by mouth daily.   Yes [provider]  sodium chloride flush (NS) 0.9 % SOLN 5 mLs by Intracatheter route every 8 (eight) hours. 05/30/22 08/28/22  Benjamine Sprague, DO    Scheduled Meds:  acetaminophen  1,000 mg Oral Q6H   aspirin  81 mg Oral Daily   atorvastatin  40 mg Oral QHS   Chlorhexidine Gluconate Cloth  6 each Topical Daily   etomidate       etomidate  20 mg Intravenous Once   fentaNYL       fentaNYL (SUBLIMAZE) injection  100 mcg Intravenous Once   gabapentin  300 mg Oral BID   insulin aspart  0-15 Units Subcutaneous TID WC  irbesartan  150 mg Oral Daily   levothyroxine  150 mcg Oral Q0600   metoprolol succinate  50 mg Oral Q breakfast   pantoprazole (PROTONIX) IV  40 mg Intravenous QHS   rocuronium bromide  50 mg Intravenous Once   rocuronium bromide       venlafaxine  37.5 mg Oral Q breakfast   Continuous Infusions:  amiodarone Stopped (07/20/22 1800)   amiodarone     cefTRIAXone (ROCEPHIN)  IV 2 g (07/20/22 1008)   heparin 650 Units/hr (07/20/22 2229)   lactated ringers 125 mL/hr at 07/20/22 2123   metronidazole 500 mg (07/20/22 2129)   phenylephrine (NEO-SYNEPHRINE) Adult infusion 20 mcg/min (07/20/22 2025)   PRN Meds:.amiodarone, etomidate, fentaNYL, HYDROmorphone (DILAUDID) injection, ondansetron **OR** ondansetron (ZOFRAN) IV, oxyCODONE, rocuronium bromide, traMADol   Active Hospital Problem list     Assessment & Plan:   Sepsis secondary to  suspected Intr-abdominal Infection -Supplemental O2 as needed to maintain O2 saturations 88 to 92% -High risk for intubation -Monitor fever curve -Trend WBC's & Procalcitonin -Follow cultures as above -Neo for MAP goal  -Continue empiric abx pending cultures & sensitivities  Diverticulitis  s/p robotic sigmoidectomy converted to open Hartman's procedure on 07/19/22 -IVF hydration -PRN antiemetic and pain management -Management per general surgery  NSTEMI Elevated  Troponin likely demand ischemia in the setting of acute illness? -Continuous cardiac monitoring -Trend HS Troponin until peaked  -Continue Metoprolol as BP permits -Continue Aspirin 81 mg and Atorvastatin '40mg'$  PO daily -Cardiology following, appreciate input -Start Heparin gtt  AFib+RVR, New onset -Serial EKGs -Trend Troponins until peaked -Check TSH, FT4 -TTEcho -Will give additional Amiodarone 150 mg IV bolus, then continue '1mg'$ /min x6 hrs, then 0.'5mg'$ /min for 18 hours  -Start Heparin as above -Keep NPO  -Cardiology following   Mild AKI  Cr gradually worsening 0.80>1.14>1.16 Lactic Acidosis 3.4>2.3 Hypokalemia -Monitor I&O's / urinary output -Follow BMP -Ensure adequate renal perfusion -Avoid nephrotoxic agents as able. Hold Irbesartan -Replace electrolytes as indicated    Diabetes mellitus -CBGs -Sliding scale insulin -Follow ICU hyper/hypoglycemia protocol -Hold home Metformin & Amaryl  Hypothyroidism -Check TSH, Free T4 -Continue Synthroid    Best practice:  Diet:  NPO Pain/Anxiety/Delirium protocol (if indicated): No VAP protocol (if indicated): Not indicated DVT prophylaxis: Systemic AC GI prophylaxis: PPI Glucose control:  SSI Yes Central venous access:  N/A Arterial line:  N/A Foley:  Yes, and it is still needed Mobility:  bed rest  PT consulted: N/A Last date of multidisciplinary goals of care discussion [8/23] Code Status:  full code Disposition: ICU   = Goals of Care = Code Status Order: FULL  Primary Emergency Contact: [place],Wade R, Home Phone: [phone] Wishes to pursue full aggressive treatment and intervention options, including CPR and intubation, but goals of care will be addressed on going with family if that should become necessary.  Critical care time: 45 minutes       Rufina Falco, DNP, CCRN, FNP-C, AGACNP-BC Acute Care Nurse Practitioner [facility]  PCCM on call pager [phone] until 7  am

## 2022-07-20 NOTE — Progress Notes (Addendum)
76 y.o female with PMH type 2 diabetes mellitus, HTN, benign Graves' disease IBS hyperlipidemia and diverticulitis s/p IR guided drainage with fistulous tract that is post laparoscopic colon resection on 8/22.  Overnight patient noted to have decreased urine output, hypertensive and tachycardic.  Due to persistent hypotension despite IV fluid resuscitation, patient was started on peripheral Levophed and transferred to the ICU.  PCCM consulted.  On arrival to the ICU, BP rechecked with improvement in her blood pressure when the cuffs were changed.  Patient currently with BP in the systolic 440H and maintaining maps greater than 65.  Current blood pressure 105/73 with a MAP of 80.  She is still slightly tachypneic and tachycardic but maintaining a good blood pressure and good oxygen saturation.  Levophed was not started.  Patient remains hemodynamically stable no critical care needs at this time. Will sign off given no intervention required. Please re-consult with any new critical care needs.   Rufina Falco, DNP, CCRN, FNP-C, AGACNP-BC Acute Care & Family Nurse Practitioner  [facility]  See Amion for personal pager PCCM on call pager [phone] until 7 am   Vital signs reviewed, ICU needs resolved  Will sign off at this time. No further recommendations at this time.     Corrin Parker, M.D.  [facility]  Medical Director [place] Director [facility] Cardio-Pulmonary Department

## 2022-07-20 NOTE — Progress Notes (Signed)
PHARMACY CONSULT NOTE - FOLLOW UP  Pharmacy Consult for Electrolyte Monitoring and Replacement   Recent Labs: Potassium (mmol/L)  Date Value  07/20/2022 3.0 (L)  01/12/2013 3.8   Magnesium (mg/dL)  Date Value  07/20/2022 2.1   Calcium (mg/dL)  Date Value  07/20/2022 7.3 (L)   Calcium, Total (mg/dL)  Date Value  01/12/2013 9.6   Albumin (g/dL)  Date Value  06/05/2022 2.6 (L)   Sodium (mmol/L)  Date Value  07/20/2022 140  01/12/2013 139    Assessment: 76 y.o. female  who, per internal medicine note dated 03/30/22 has history of DM, HTN, grave's disease, diverticulitis who presented to [facility] for scheduled laparoscopic colon resection on 07/19/2022. Pharmacy is asked to follow and replace electrolytes while in CCU  Goal of Therapy:  Electrolytes WNL  Plan:  40 mEq KCl x 1 Repeat electrolytes in am  Dallie Piles ,PharmD Clinical Pharmacist 07/20/2022 2:07 PM

## 2022-07-20 NOTE — Consult Note (Addendum)
ANTICOAGULATION CONSULT NOTE  Pharmacy Consult for IV Heparin Indication: chest pain/ACS  Patient Measurements: Height: 5' 2.99" (160 cm) Weight: 55.3 kg (122 lb) IBW/kg (Calculated) : 52.38 Heparin Dosing Weight: 55.3 kg  Labs: Recent Labs    07/20/22 0327 07/20/22 0327 07/20/22 1155 07/20/22 1344 07/20/22 1528 07/20/22 1742 07/20/22 2032  HGB 9.6*  --  9.4*  --   --   --  8.3*  HCT 30.8*  --  30.6*  --   --   --  28.1*  PLT 256  --   --   --   --   --  305  CREATININE 0.81  --   --   --   --  1.14* 1.16*  TROPONINIHS  --    < > 6,999* 9,494* 8,837* 11,207*  --    < > = values in this interval not displayed.    Estimated Creatinine Clearance: 34.1 mL/min (A) (by C-G formula based on SCr of 1.16 mg/dL (H)).   Medical History: Past Medical History:  Diagnosis Date   Arthritis    hands   Cancer ([place])    thyroid- radiation   Diabetes mellitus without complication (HCC)    Hyperlipidemia    Hypertension    Hypothyroidism    IBS (irritable bowel syndrome)     Medications:  No anticoagulation prior to admission per my chart review  Assessment: Patient is a 76 y/o F with medical history as above who presented to the hospital 8/22 with chronic diverticulitis for laparoscopic sigmoidectomy converted to open Hartmann's procedure. POD # 1 patient declined and critical care medicine was consulted for hemodynamic instability with decreased urine output. Cardiology consulted due to concern for perioperative NSTEMI. Pharmacy consulted to initiate and manage heparin infusion for suspected ACS. Of note, patient also with new-onset Afib with RVR.  Baseline aPTT and PT-INR are pending. CBC notable for down-trending hemoglobin (9.6 >> 8.3, baseline around ~10)  Goal of Therapy:  Heparin level 0.3-0.7 units/ml Monitor platelets by anticoagulation protocol: Yes   Plan:  --Start heparin at 650 units/hr, no bolus given recent surgery --HL 8 hours after initiation of  infusion --Daily CBC per protocol while on IV heparin; monitor closely for s/sx of bleeding  Benita Gutter 07/20/2022,9:21 PM

## 2022-07-21 ENCOUNTER — Inpatient Hospital Stay: Payer: Medicare Other

## 2022-07-21 ENCOUNTER — Inpatient Hospital Stay: Payer: Self-pay

## 2022-07-21 DIAGNOSIS — K5792 Diverticulitis of intestine, part unspecified, without perforation or abscess without bleeding: Secondary | ICD-10-CM | POA: Diagnosis not present

## 2022-07-21 LAB — URINALYSIS, COMPLETE (UACMP) WITH MICROSCOPIC
Bilirubin Urine: NEGATIVE
Glucose, UA: NEGATIVE mg/dL
Ketones, ur: NEGATIVE mg/dL
Nitrite: NEGATIVE
Protein, ur: 30 mg/dL — AB
Specific Gravity, Urine: 1.027 (ref 1.005–1.030)
pH: 5 (ref 5.0–8.0)

## 2022-07-21 LAB — BLOOD GAS, ARTERIAL
Acid-base deficit: 5.3 mmol/L — ABNORMAL HIGH (ref 0.0–2.0)
Bicarbonate: 19.1 mmol/L — ABNORMAL LOW (ref 20.0–28.0)
O2 Content: 4 L/min
O2 Saturation: 96.4 %
Patient temperature: 37
pCO2 arterial: 33 mmHg (ref 32–48)
pH, Arterial: 7.37 (ref 7.35–7.45)
pO2, Arterial: 75 mmHg — ABNORMAL LOW (ref 83–108)

## 2022-07-21 LAB — BASIC METABOLIC PANEL
Anion gap: 6 (ref 5–15)
BUN: 23 mg/dL (ref 8–23)
CO2: 21 mmol/L — ABNORMAL LOW (ref 22–32)
Calcium: 7.4 mg/dL — ABNORMAL LOW (ref 8.9–10.3)
Chloride: 113 mmol/L — ABNORMAL HIGH (ref 98–111)
Creatinine, Ser: 1.01 mg/dL — ABNORMAL HIGH (ref 0.44–1.00)
GFR, Estimated: 58 mL/min — ABNORMAL LOW (ref >60)
Glucose, Bld: 156 mg/dL — ABNORMAL HIGH (ref 70–99)
Potassium: 3.7 mmol/L (ref 3.5–5.1)
Sodium: 140 mmol/L (ref 135–145)

## 2022-07-21 LAB — MRSA NEXT GEN BY PCR, NASAL: MRSA by PCR Next Gen: NOT DETECTED

## 2022-07-21 LAB — GLUCOSE, CAPILLARY
Glucose-Capillary: 168 mg/dL — ABNORMAL HIGH (ref 70–99)
Glucose-Capillary: 128 mg/dL — ABNORMAL HIGH (ref 70–99)
Glucose-Capillary: 114 mg/dL — ABNORMAL HIGH (ref 70–99)
Glucose-Capillary: 123 mg/dL — ABNORMAL HIGH (ref 70–99)
Glucose-Capillary: 172 mg/dL — ABNORMAL HIGH (ref 70–99)

## 2022-07-21 LAB — CBC
HCT: 30.6 % — ABNORMAL LOW (ref 36.0–46.0)
Hemoglobin: 9.4 g/dL — ABNORMAL LOW (ref 12.0–15.0)
MCH: 27.6 pg (ref 26.0–34.0)
MCHC: 30.7 g/dL (ref 30.0–36.0)
MCV: 89.7 fL (ref 80.0–100.0)
Platelets: 351 10*3/uL (ref 150–400)
RBC: 3.41 MIL/uL — ABNORMAL LOW (ref 3.87–5.11)
RDW: 16.7 % — ABNORMAL HIGH (ref 11.5–15.5)
WBC: 11.8 10*3/uL — ABNORMAL HIGH (ref 4.0–10.5)
nRBC: 0 % (ref 0.0–0.2)

## 2022-07-21 LAB — HEPARIN LEVEL (UNFRACTIONATED)
Heparin Unfractionated: <0.1 IU/mL — ABNORMAL LOW (ref 0.30–0.70)
Heparin Unfractionated: <0.1 IU/mL — ABNORMAL LOW (ref 0.30–0.70)

## 2022-07-21 LAB — TROPONIN I (HIGH SENSITIVITY)
Troponin I (High Sensitivity): 12826 ng/L (ref <18)
Troponin I (High Sensitivity): 12987 ng/L (ref <18)
Troponin I (High Sensitivity): 13331 ng/L (ref <18)
Troponin I (High Sensitivity): 11961 ng/L (ref <18)
Troponin I (High Sensitivity): 10573 ng/L (ref <18)

## 2022-07-21 LAB — PROCALCITONIN: Procalcitonin: 1.37 ng/mL

## 2022-07-21 LAB — MAGNESIUM: Magnesium: 2.6 mg/dL — ABNORMAL HIGH (ref 1.7–2.4)

## 2022-07-21 LAB — LACTIC ACID, PLASMA: Lactic Acid, Venous: 2.3 mmol/L (ref 0.5–1.9)

## 2022-07-21 MED ORDER — LACTATED RINGERS IV SOLN: INTRAVENOUS | Status: DC

## 2022-07-21 MED ORDER — MAGNESIUM SULFATE 2 GM/50ML IV SOLN
2.0000 g | Freq: Once | INTRAVENOUS | Status: AC
  Administered 2022-07-21: 2 g via INTRAVENOUS
  Filled 2022-07-21: qty 50

## 2022-07-21 MED ORDER — HEPARIN BOLUS VIA INFUSION
1500.0000 [IU] | Freq: Once | INTRAVENOUS | Status: AC
  Administered 2022-07-21: 1500 [IU] via INTRAVENOUS
  Filled 2022-07-21: qty 1500

## 2022-07-21 MED ORDER — IPRATROPIUM-ALBUTEROL 0.5-2.5 (3) MG/3ML IN SOLN
3.0000 mL | Freq: Four times a day (QID) | RESPIRATORY_TRACT | Status: DC
  Administered 2022-07-22 – 2022-07-23 (×5): 3 mL via RESPIRATORY_TRACT
  Filled 2022-07-21 (×6): qty 3

## 2022-07-21 MED ORDER — VANCOMYCIN HCL 1250 MG/250ML IV SOLN
1250.0000 mg | INTRAVENOUS | Status: DC
  Administered 2022-07-21: 1250 mg via INTRAVENOUS
  Filled 2022-07-21: qty 250

## 2022-07-21 MED ORDER — POTASSIUM CHLORIDE 10 MEQ/100ML IV SOLN
10.0000 meq | INTRAVENOUS | Status: AC
  Administered 2022-07-21 (×2): 10 meq via INTRAVENOUS
  Filled 2022-07-21 (×2): qty 100

## 2022-07-21 MED ORDER — TRACE MINERALS CU-MN-SE-ZN 300-55-60-3000 MCG/ML IV SOLN
INTRAVENOUS | Status: AC
  Filled 2022-07-21: qty 266.53

## 2022-07-21 MED ORDER — INSULIN ASPART 100 UNIT/ML IJ SOLN
0.0000 [IU] | INTRAMUSCULAR | Status: DC
  Administered 2022-07-21 (×2): 2 [IU] via SUBCUTANEOUS
  Administered 2022-07-22 (×5): 5 [IU] via SUBCUTANEOUS
  Administered 2022-07-22: 3 [IU] via SUBCUTANEOUS
  Administered 2022-07-23: 11 [IU] via SUBCUTANEOUS
  Administered 2022-07-23: 3 [IU] via SUBCUTANEOUS
  Administered 2022-07-23 (×2): 11 [IU] via SUBCUTANEOUS
  Administered 2022-07-23: 3 [IU] via SUBCUTANEOUS
  Administered 2022-07-23: 8 [IU] via SUBCUTANEOUS
  Filled 2022-07-21 (×13): qty 1

## 2022-07-21 MED ORDER — METOPROLOL TARTRATE 25 MG PO TABS: 25.0000 mg | ORAL_TABLET | Freq: Three times a day (TID) | ORAL | Status: DC

## 2022-07-21 MED ORDER — ALPRAZOLAM 0.25 MG PO TABS
0.2500 mg | ORAL_TABLET | Freq: Once | ORAL | Status: AC
  Administered 2022-07-21: 0.25 mg via ORAL
  Filled 2022-07-21: qty 1

## 2022-07-21 MED ORDER — SODIUM CHLORIDE 0.9% FLUSH
10.0000 mL | Freq: Two times a day (BID) | INTRAVENOUS | Status: DC
  Administered 2022-07-21 – 2022-07-24 (×7): 10 mL
  Administered 2022-07-25 (×2): 30 mL
  Administered 2022-07-26 – 2022-08-01 (×13): 10 mL

## 2022-07-21 MED ORDER — SODIUM CHLORIDE 0.9% FLUSH: 10.0000 mL | INTRAVENOUS | Status: DC | PRN

## 2022-07-21 MED ORDER — METHYLPREDNISOLONE SODIUM SUCC 125 MG IJ SOLR
125.0000 mg | Freq: Once | INTRAMUSCULAR | Status: AC
  Administered 2022-07-21: 125 mg via INTRAVENOUS
  Filled 2022-07-21: qty 2

## 2022-07-21 MED ORDER — POTASSIUM CHLORIDE 20 MEQ PO PACK
40.0000 meq | PACK | Freq: Once | ORAL | Status: AC
  Administered 2022-07-21: 40 meq via ORAL
  Filled 2022-07-21: qty 2

## 2022-07-21 MED ORDER — POTASSIUM CHLORIDE CRYS ER 20 MEQ PO TBCR
40.0000 meq | EXTENDED_RELEASE_TABLET | Freq: Once | ORAL | Status: DC
Start: 2022-07-21 — End: 2022-07-21

## 2022-07-21 MED ORDER — IPRATROPIUM-ALBUTEROL 0.5-2.5 (3) MG/3ML IN SOLN
3.0000 mL | Freq: Four times a day (QID) | RESPIRATORY_TRACT | Status: DC | PRN
  Administered 2022-07-21: 3 mL via RESPIRATORY_TRACT

## 2022-07-21 NOTE — Progress Notes (Signed)
Pharmacy Antibiotic Note  Sarah [name] is a 76 y.o. female admitted on 07/19/2022 with sepsis.  Pharmacy has been consulted for Vancomycin dosing.  Plan: Vancomycin 1250 mg IV Q 48 hrs. Goal AUC 400-550. Expected AUC: 479.6 SCr used: 1.16  Pharmacy will continue to follow and will adjust abx dosing whenever warranted.  Height: 5' 2.99" (160 cm) Weight: 55.3 kg (122 lb) IBW/kg (Calculated) : 52.38  Temp (24hrs), Avg:98.4 F (36.9 C), Min:98.1 F (36.7 C), Max:99.3 F (37.4 C)   Recent Labs  Lab 07/20/22 0327 07/20/22 1742 07/20/22 2032 07/20/22 2324  WBC 5.4  --  11.3*  --   CREATININE 0.81 1.14* 1.16*  --   LATICACIDVEN  --   --  3.4* 2.3*    Estimated Creatinine Clearance: 34.1 mL/min (A) (by C-G formula based on SCr of 1.16 mg/dL (H)).    Allergies  Allergen Reactions   Penicillins Anaphylaxis    TOLERATED CEFOTETAN 06/2022   Sulfa Antibiotics Rash    Antimicrobials this admission: 8/23 Ceftriaxone >> 8/23 Flagyl >>  8/24 Vancomycin >>   Microbiology results: No labs ordered or pending at this time.  Thank you for allowing pharmacy to be a part of this patient's care.  Renda Rolls, PharmD, MBA 07/21/2022 2:02 AM

## 2022-07-21 NOTE — Progress Notes (Signed)
PHARMACY - TOTAL PARENTERAL NUTRITION CONSULT NOTE   Indication: Massive bowel resection  Patient Measurements: Height: 5' 2.99" (160 cm) Weight: 55.3 kg (122 lb) IBW/kg (Calculated) : 52.38 TPN AdjBW (KG): 55.3 Body mass index is 21.62 kg/m.  Assessment: 76yoF with a PMH of hypertension, Graves' disease s/p XRT, type 2 diabetes, IBS, diverticulitis with abscess formation s/p CT-guided drain placement in early June 2023 who presented to [facility] for scheduled laparoscopic sigmoidectomy on 07/19/2022.  Glucose / Insulin: on mSSI q4h  BG 128 - 163 previous 24h, 8u SSI required Electrolytes:  Renal: SCr 1.16-->1.01 Hepatic: LFTs previously wnl Intake / Output; MIVF: lactated ringers at 125 mL/hr GI Imaging: 8/24 Abd xray Normal abdominal gas pattern GI Surgeries / Procedures: laparoscopic sigmoidectomy on 07/19/2022  Central access: 07/21/22 TPN start date: 07/21/22  Nutritional Goals: Goal TPN rate is 70 mL/hr (provides 80 g of protein and 1600 kcals per day)  RD Assessment: Estimated Needs Total Energy Estimated Needs: 1400-1600kcal/day Total Protein Estimated Needs: 70-80g/day Total Fluid Estimated Needs: 1.3-1.5L/day  Current Nutrition:  NPO  Plan:  Start TPN at 57m/hr at 1800 Nutritional components Protein (as 15% Clinisol): 40 grams Dextrose: 14% Lipids (as 20% SMOFilpids): 24 grams kCal 799.8/24h Electrolytes in TPN (standard): Na 544m/L, K 5061mL, Ca 5mE8m, Mg 5mEq65m and Phos 15mmo64m Cl:Ac 1:1 Add standard MVI, thiamine 100 mg x 3 days and trace elements to TPN continue Moderate q4h SSI and adjust as needed  Reduce MIVF to 90 mL/hr at 1800 Monitor TPN labs on Mon/Thurs, daily until stable  RodneyDallie Piles2023,11:48 AM

## 2022-07-21 NOTE — Consult Note (Signed)
ANTICOAGULATION CONSULT NOTE  Pharmacy Consult for IV Heparin Indication: chest pain/ACS  Patient Measurements: Height: 5' 2.99" (160 cm) Weight: 55.3 kg (122 lb) IBW/kg (Calculated) : 52.38 Heparin Dosing Weight: 55.3 kg  Labs: Recent Labs    07/20/22 0327 07/20/22 0327 07/20/22 1155 07/20/22 1344 07/20/22 1528 07/20/22 1742 07/20/22 2032 07/20/22 2141 07/21/22 0306  HGB 9.6*  --  9.4*  --   --   --  8.3*  --  9.4*  HCT 30.8*  --  30.6*  --   --   --  28.1*  --  30.6*  PLT 256  --   --   --   --   --  305  --  351  APTT  --   --   --   --   --   --   --  44*  --   LABPROT  --   --   --   --   --   --   --  19.5*  --   INR  --   --   --   --   --   --   --  1.7*  --   CREATININE 0.81  --   --   --   --  1.14* 1.16*  --  1.01*  TROPONINIHS  --    < > 6,999* 9,494* 8,837* 11,207*  --   --   --    < > = values in this interval not displayed.     Estimated Creatinine Clearance: 39.2 mL/min (A) (by C-G formula based on SCr of 1.01 mg/dL (H)).   Medical History: Past Medical History:  Diagnosis Date   Arthritis    hands   Cancer ([place])    thyroid- radiation   Diabetes mellitus without complication (HCC)    Hyperlipidemia    Hypertension    Hypothyroidism    IBS (irritable bowel syndrome)     Medications:  No anticoagulation prior to admission per my chart review  Assessment: Patient is a 76 y/o F with medical history as above who presented to the hospital 8/22 with chronic diverticulitis for laparoscopic sigmoidectomy converted to open Hartmann's procedure. POD # 1 patient declined and critical care medicine was consulted for hemodynamic instability with decreased urine output. Cardiology consulted due to concern for perioperative NSTEMI. Pharmacy consulted to initiate and manage heparin infusion for suspected ACS. Of note, patient also with new-onset Afib with RVR.  Goal of Therapy:  Heparin level 0.3-0.7 units/ml Monitor platelets by anticoagulation protocol:  Yes   Plan: heparin level undetectable --bolus 1500 units IV heparin then increase infusion rate to 850 units/hr --heparin level 8 hours after rate change --Daily CBC per protocol while on IV heparin; monitor closely for s/sx of bleeding  Dallie Piles 07/21/2022,7:07 AM

## 2022-07-21 NOTE — Progress Notes (Signed)
Peripherally Inserted Central Catheter Placement  The IV Nurse has discussed with the patient and/or persons authorized to consent for the patient, the purpose of this procedure and the potential benefits and risks involved with this procedure.  The benefits include less needle sticks, lab draws from the catheter, and the patient may be discharged home with the catheter. Risks include, but not limited to, infection, bleeding, blood clot (thrombus formation), and puncture of an artery; nerve damage and irregular heartbeat and possibility to perform a PICC exchange if needed/ordered by physician.  Alternatives to this procedure were also discussed.  Bard Power PICC patient education guide, fact sheet on infection prevention and patient information card has been provided to patient /or left at bedside.    PICC Placement Documentation  PICC Triple Lumen 09/32/67 Right Cephalic 35 cm 0 cm (Active)  Indication for Insertion or Continuance of Line Administration of hyperosmolar/irritating solutions (i.e. TPN, Vancomycin, etc.) 07/21/22 1011  Exposed Catheter (cm) 0 cm 07/21/22 1011  Site Assessment Clean, Dry, Intact 07/21/22 1011  Lumen #1 Status Flushed;Blood return noted;Saline locked 07/21/22 1011  Lumen #2 Status Flushed;Blood return noted;Saline locked 07/21/22 1011  Lumen #3 Status Flushed;Blood return noted;Saline locked 07/21/22 1011  Dressing Type Transparent 07/21/22 1011  Dressing Status Antimicrobial disc in place 07/21/22 1011  Dressing Change Due 07/28/22 07/21/22 1011       Allan Minotti, [place] Ramos 07/21/2022, 10:14 AM

## 2022-07-21 NOTE — Progress Notes (Addendum)
Initial Nutrition Assessment  DOCUMENTATION CODES:   Severe malnutrition in context of acute illness/injury  INTERVENTION:   TPN per pharmacy   Recommend thiamine 164m daily added to TPN x 3 days   Pt at high refeed risk; recommend monitor potassium, magnesium and phosphorus labs daily until stable  Daily weights  NUTRITION DIAGNOSIS:   Severe Malnutrition related to acute illness as evidenced by 21 percent weight loss in 3 months, severe fat depletion, severe muscle depletion.  GOAL:   Patient will meet greater than or equal to 90% of their needs  MONITOR:   Diet advancement, Labs, Weight trends, Skin, I & O's, Other (Comment) (TPN)  REASON FOR ASSESSMENT:   Consult New TPN/TNA  ASSESSMENT:   76y/o female with a PMH of hypertension, Graves' disease s/p XRT, hypothyroidism, type 2 DM, IBS, HTN, HLD and diverticulitis with abscess formation s/p IR drain 7/18, now s/p Hartmann's procedure 87/67complicated by sepsis, AKI and new Afib.  Met with pt in room today. Pt reports that she is feeling ok today. Pt reports soreness in her abdomen from "people pushing on it". Pt denies any feelings of bloating or cramping. Pt with gas and stool in her ostomy. Pt reports intermittent, mild nausea at times. Pt's main complaint is a dry mouth; pt is having some ice chips. Pt reports a 30lb recent weight loss. Per chart, pt is down 33lbs(21%) over the past 3 months; this is severe weight loss. Plan today is for PICC line and TPN. Pt is at high refeed risk. Will monitor daily weights as pt with new Afib. Pt is willing to drink supplements with diet advancement.   Medications reviewed and include: aspirin, insulin, synthroid, protonix, ceftriaxone, heparin, LRS _0 /hr, metronidazole, neo-synephrine  Labs reviewed: creat 1.01(H), Mg 2.6(H) P 3.4 wnl- 8/23  Wbc- 11.8(H), Hgb 9.4(L), Hct 30.6(L) Cbgs- 128, 168 x 24hrs AIC 8.3(H)- 7/3  Drain- 657m NUTRITION - FOCUSED PHYSICAL  EXAM:  Flowsheet Row Most Recent Value  Orbital Region Moderate depletion  Upper Arm Region Severe depletion  Thoracic and Lumbar Region Severe depletion  Buccal Region Moderate depletion  Temple Region Moderate depletion  Clavicle Bone Region Severe depletion  Clavicle and Acromion Bone Region Severe depletion  Scapular Bone Region Moderate depletion  Dorsal Hand Moderate depletion  Patellar Region Severe depletion  Anterior Thigh Region Severe depletion  Posterior Calf Region Severe depletion  Edema (RD Assessment) None  Hair Reviewed  Eyes Reviewed  Mouth Reviewed  Skin Reviewed  Nails Reviewed   Diet Order:   Diet Order             Diet NPO time specified Except for: Ice Chips, Sips with Meds  Diet effective now                  EDUCATION NEEDS:   Education needs have been addressed  Skin:  Skin Assessment: Reviewed RN Assessment (incision abdomen)  Last BM:  8/24- 17532mia ostomy  Height:   Ht Readings from Last 1 Encounters:  07/20/22 5' 2.99" (1.6 m)    Weight:   Wt Readings from Last 1 Encounters:  07/19/22 55.3 kg    Ideal Body Weight:  52.3 kg  BMI:  Body mass index is 21.62 kg/m.  Estimated Nutritional Needs:   Kcal:  1400-1600kcal/day  Protein:  70-80g/day  Fluid:  1.3-1.5L/day  CasKoleen Distance, RD, LDN Please refer to [facility] and/or RD on-call/weekend/after hours pager

## 2022-07-21 NOTE — Consult Note (Signed)
ANTICOAGULATION CONSULT NOTE  Pharmacy Consult for IV Heparin Indication: chest pain/ACS  Patient Measurements: Height: 5' 2.99" (160 cm) Weight: 55.3 kg (122 lb) IBW/kg (Calculated) : 52.38 Heparin Dosing Weight: 55.3 kg  Labs: Recent Labs    07/20/22 0327 07/20/22 1155 07/20/22 1344 07/20/22 1742 07/20/22 2032 07/20/22 2141 07/21/22 0306 07/21/22 0656 07/21/22 0844 07/21/22 1120 07/21/22 1416 07/21/22 1638  HGB 9.6* 9.4*  --   --  8.3*  --  9.4*  --   --   --   --   --   HCT 30.8* 30.6*  --   --  28.1*  --  30.6*  --   --   --   --   --   PLT 256  --   --   --  305  --  351  --   --   --   --   --   APTT  --   --   --   --   --  44*  --   --   --   --   --   --   LABPROT  --   --   --   --   --  19.5*  --   --   --   --   --   --   INR  --   --   --   --   --  1.7*  --   --   --   --   --   --   HEPARINUNFRC  --   --   --   --   --   --   --  <0.10*  --   --   --   --   CREATININE 0.81  --   --  1.14* 1.16*  --  1.01*  --   --   --   --   --   TROPONINIHS  --  6,999*   < > 11,207*  --   --   --   --    < > 12,987* 13,331* 11,961*   < > = values in this interval not displayed.   Estimated Creatinine Clearance: 39.2 mL/min (A) (by C-G formula based on SCr of 1.01 mg/dL (H)). Medical History: Past Medical History:  Diagnosis Date   Arthritis    hands   Cancer ([place])    thyroid- radiation   Diabetes mellitus without complication (HCC)    Hyperlipidemia    Hypertension    Hypothyroidism    IBS (irritable bowel syndrome)    Medications:  No anticoagulation prior to admission per my chart review  Assessment: Patient is a 76 y/o F with medical history as above who presented to the hospital 8/22 with chronic diverticulitis for laparoscopic sigmoidectomy converted to open Hartmann's procedure. POD # 1 patient declined and critical care medicine was consulted for hemodynamic instability with decreased urine output. Cardiology consulted due to concern for perioperative  NSTEMI. Pharmacy consulted to initiate and manage heparin infusion for suspected ACS. Of note, patient also with new-onset Afib with RVR.  Date Time  HL Rate/Comment 0824 0656 <0.1 Subthera; 650 > 850 un/hr 0824 1651 <0.1 Subthera; 850 > 1050 un/hr (Error with initial lab; rerun is <0.1)   Baseline Labs: aPTT - 44s; INR - 1.7 Hgb - 9.4; Plts - 351  Goal of Therapy:  Heparin level 0.3-0.7 units/ml Monitor platelets by anticoagulation protocol: Yes   Plan: Issue with Lab running initial heparin  level and crossing over in system, however on repeat heparin level on remains undetectable. Will repeat bolus and increase of infusion rate. --bolus 1500 units IV heparin then increase infusion rate to 850 units/hr --heparin level 8 hours after rate change --Daily CBC per protocol while on IV heparin; monitor closely for s/sx of bleeding  Shanon Brow Antonina Deziel 07/21/2022,7:38 PM

## 2022-07-21 NOTE — Progress Notes (Signed)
[place] NOTE       Patient ID: MATEYA TORTI MRN: [id number] DOB/AGE: 05/23/1946 76 y.o.  Admit date: 07/19/2022 Referring Physician Donell Beers, NP  Primary Physician Dr. Sabra Heck Primary Cardiologist Dr. Saralyn Pilar  Reason for Consultation elevated troponin & abnormal EKG, concern for perioperative NSTEMI  HPI: SETH FRIEDLANDER is a 76yoF with a PMH of hypertension, Graves' disease s/p XRT, type 2 diabetes, IBS, diverticulitis with abscess formation s/p CT-guided drain placement in early June 2023 who presented to [facility] for scheduled laparoscopic sigmoidectomy on 07/19/2022.  The evening following the procedure she reportedly became hypotensive and tachycardic with SBP in the 90s.  CCM was consulted and the patient was transferred to the ICU early morning of 8/23.  Her blood pressure improved on recheck with systolic in the low 409W and a MAP of 80.  She did not require vasopressor support and was actually given her home antihypertensives on 8/23.  Cardiology is consulted the afternoon of 8/23 due to concern for an abnormal EKG and elevated troponin to 7000, concern for perioperative NSTEMI.  Interval History: - developed AF RVR overnight, converted to sinus this morning - started on amiodarone bolus x 2 and subsequent gtt and heparin gtt overnight (surgery ok, per CCU).  She was also on Neo overnight, weaned this morning. -troponin up trended again last night with current peak of 13,300 -Feels "rough" and very fatigued, significant mouth and throat dryness, was nauseous overnight but this is resolved this morning.  Continues to deny chest pain. -Echo resulted with reduced EF from prior 45-50% with global hypokinesis  Review of systems complete and found to be negative unless listed above     Past Medical History:  Diagnosis Date   Arthritis    hands   Cancer ([place])    thyroid- radiation   Diabetes mellitus without complication ([place])    Hyperlipidemia    Hypertension     Hypothyroidism    IBS (irritable bowel syndrome)     Past Surgical History:  Procedure Laterality Date   ABDOMINAL HYSTERECTOMY     CATARACT EXTRACTION W/PHACO Left 12/21/2016   Procedure: CATARACT EXTRACTION PHACO AND INTRAOCULAR LENS PLACEMENT ([place])  left eye;  Surgeon: Leandrew Koyanagi, MD;  Location: [place];  Service: Ophthalmology;  Laterality: Left;  Diabetic - oral meds   CATARACT EXTRACTION W/PHACO Right 01/25/2017   Procedure: CATARACT EXTRACTION PHACO AND INTRAOCULAR LENS PLACEMENT ([place])  right diabetic;  Surgeon: Leandrew Koyanagi, MD;  Location: [place];  Service: Ophthalmology;  Laterality: Right;  diabetic - oral meds   CHOLECYSTECTOMY     COLONOSCOPY WITH PROPOFOL N/A 10/26/2016   Procedure: COLONOSCOPY WITH PROPOFOL;  Surgeon: Manya Silvas, MD;  Location: [facility] ENDOSCOPY;  Service: Endoscopy;  Laterality: N/A;   IR RADIOLOGIST EVAL & MGMT  06/14/2022   KNEE SURGERY Right     Medications Prior to Admission  Medication Sig Dispense Refill Last Dose   cyanocobalamin 1000 MCG tablet Take 1,000 mcg by mouth daily.   07/18/2022   glimepiride (AMARYL) 4 MG tablet Take 4 mg by mouth daily with breakfast.   07/18/2022   levothyroxine (SYNTHROID) 150 MCG tablet Take 150 mcg by mouth daily before breakfast.   07/19/2022 at 0730   metFORMIN (GLUCOPHAGE) 500 MG tablet Take 500 mg by mouth 2 (two) times daily with a meal.      metoprolol succinate (TOPROL-XL) 50 MG 24 hr tablet Take 50 mg by mouth daily. Take with or immediately  following a meal.   07/19/2022 at 0730   Olmesartan-Amlodipine-HCTZ 40-10-25 MG TABS Take 1 tablet by mouth 3 (three) times a week. Mon, Wed, Fri   07/19/2022 at 0730   ondansetron (ZOFRAN-ODT) 4 MG disintegrating tablet Take 1 tablet (4 mg total) by mouth every 8 (eight) hours as needed for nausea or vomiting. 12 tablet 0 07/19/2022 at 0200   potassium chloride (K-DUR) 10 MEQ tablet Take 10 mEq by mouth daily.   07/18/2022    sertraline (ZOLOFT) 100 MG tablet Take 100 mg by mouth daily.      simvastatin (ZOCOR) 20 MG tablet Take 20 mg by mouth daily.   Past Week   venlafaxine (EFFEXOR) 37.5 MG tablet Take 37.5 mg by mouth daily.   07/19/2022 at 0730   sodium chloride flush (NS) 0.9 % SOLN 5 mLs by Intracatheter route every 8 (eight) hours. 1350 mL 0    Social History   Socioeconomic History   Marital status: Married    Spouse name: Not on file   Number of children: Not on file   Years of education: Not on file   Highest education level: Not on file  Occupational History   Not on file  Tobacco Use   Smoking status: Never   Smokeless tobacco: Never   Tobacco comments:    social as teenager  Vaping Use   Vaping Use: Never used  Substance and Sexual Activity   Alcohol use: No   Drug use: No   Sexual activity: Not on file  Other Topics Concern   Not on file  Social History Narrative   Not on file   Social Determinants of Health   Financial Resource Strain: Not on file  Food Insecurity: Not on file  Transportation Needs: Not on file  Physical Activity: Not on file  Stress: Not on file  Social Connections: Not on file  Intimate Partner Violence: Not on file    Family History  Problem Relation Age of Onset   Breast cancer Neg Hx       PHYSICAL EXAM General: Elderly and frail-appearing Caucasian female, in no acute distress, sitting in incline in ICU bed.  2 family members at bedside HEENT:  Normocephalic and atraumatic. Neck:  No JVD.  Lungs: Normal respiratory effort on room air. Clear bilaterally to auscultation. No wheezes, crackles, rhonchi.  Heart: Tachycardic but regular.  Normal S1 and S2 without gallops or murmurs.  Abdomen: Non-distended appearing.  Vertical midline incision present with honeycomb dressing.  Colostomy present LLQ with good output Msk: Normal strength and tone for age. Extremities: Warm and well perfused. No clubbing, cyanosis.  No peripheral edema.  Neuro: Alert and  oriented X 3. Psych:  Answers questions appropriately.   Labs: Basic Metabolic Panel: Recent Labs    07/20/22 1808 07/20/22 2032 07/21/22 0306  NA  --  142 140  K 3.2* 3.3* 3.7  CL  --  112* 113*  CO2  --  22 21*  GLUCOSE  --  163* 156*  BUN  --  24* 23  CREATININE  --  1.16* 1.01*  CALCIUM  --  7.3* 7.4*  MG  --  2.0 2.6*  PHOS 3.4  --   --     Liver Function Tests: No results for input(s): "AST", "ALT", "ALKPHOS", "BILITOT", "PROT", "ALBUMIN" in the last 72 hours. No results for input(s): "LIPASE", "AMYLASE" in the last 72 hours. CBC: Recent Labs    07/20/22 2032 07/21/22 0306  WBC 11.3* 11.8*  HGB 8.3* 9.4*  HCT 28.1* 30.6*  MCV 91.5 89.7  PLT 305 351    Cardiac Enzymes: Recent Labs    07/20/22 1344 07/20/22 1528 07/20/22 1742  TROPONINIHS 9,494* 8,837* 11,207*    BNP: Invalid input(s): "POCBNP" D-Dimer: No results for input(s): "DDIMER" in the last 72 hours. Hemoglobin A1C: No results for input(s): "HGBA1C" in the last 72 hours. Fasting Lipid Panel: No results for input(s): "CHOL", "HDL", "LDLCALC", "TRIG", "CHOLHDL", "LDLDIRECT" in the last 72 hours. Thyroid Function Tests: No results for input(s): "TSH", "T4TOTAL", "T3FREE", "THYROIDAB" in the last 72 hours.  Invalid input(s): "FREET3" Anemia Panel: No results for input(s): "VITAMINB12", "FOLATE", "FERRITIN", "TIBC", "IRON", "RETICCTPCT" in the last 72 hours.  [place] EKG SITE RITE  Result Date: 07/21/2022 If [facility] image not attached, placement could not be confirmed due to current cardiac rhythm.  DG Abd 1 View  Result Date: 07/21/2022 CLINICAL DATA:  [id number].  Abdominal pain EXAM: ABDOMEN - 1 VIEW COMPARISON:  06/21/2015 FINDINGS: Multiple surgical skin staples overlie the lower abdomen. Normal abdominal gas pattern. No gross free intraperitoneal gas. Cholecystectomy clips are seen in the right upper quadrant. Surgical drain overlies the mid pelvis. No organomegaly. Osseous structures are  age-appropriate. IMPRESSION: Normal abdominal gas pattern. Electronically Signed   By: Fidela Salisbury M.D.   On: 07/21/2022 00:36   DG Chest 1 View  Result Date: 07/21/2022 CLINICAL DATA:  Abdominal pain.  [id number] EXAM: CHEST  1 VIEW COMPARISON:  02/23/2021 FINDINGS: Lungs volumes are small, but are symmetric and are clear. No pneumothorax or pleural effusion. Cardiac size within normal limits. Pulmonary vascularity is normal. Osseous structures are age-appropriate. No acute bone abnormality. IMPRESSION: No active disease. Electronically Signed   By: Fidela Salisbury M.D.   On: 07/21/2022 00:35   ECHOCARDIOGRAM COMPLETE  Result Date: 07/20/2022    ECHOCARDIOGRAM REPORT   Patient Name:   ADDISON FREIMUTH Date of Exam: 07/20/2022 Medical Rec #:  [id number]   Height:       63.0 in Accession #:    [id number]  Weight:       122.0 lb Date of Birth:  May 05, 1946   BSA:          1.567 m Patient Age:    65 years    BP:           105/73 mmHg Patient Gender: F           HR:           126 bpm. Exam Location:  [facility] Procedure: 2D Echo, Color Doppler and Cardiac Doppler Indications:     I21.4 NSTEMI  History:         Patient has no prior history of Echocardiogram examinations.                  Arrythmias:Tachycardia; Risk Factors:Hypertension, Diabetes and                  Dyslipidemia.  Sonographer:     Charmayne Sheer Referring Phys:  [id number] [place] Adanna Zuckerman Diagnosing Phys: Yolonda Kida MD  Sonographer Comments: No subcostal window. IMPRESSIONS  1. Left ventricular ejection fraction, by estimation, is 45 to 50%. The left ventricle has mildly decreased function. The left ventricle demonstrates global hypokinesis. The left ventricular internal cavity size was mildly to moderately dilated. Left ventricular diastolic parameters were normal.  2. Right ventricular systolic function is normal. The right ventricular size is normal.  3. The mitral valve is normal in  structure. No evidence of mitral valve regurgitation.  4. The aortic  valve is normal in structure. Aortic valve regurgitation is not visualized. FINDINGS  Left Ventricle: Left ventricular ejection fraction, by estimation, is 45 to 50%. The left ventricle has mildly decreased function. The left ventricle demonstrates global hypokinesis. The left ventricular internal cavity size was mildly to moderately dilated. There is borderline left ventricular hypertrophy. Left ventricular diastolic parameters were normal. Right Ventricle: The right ventricular size is normal. No increase in right ventricular wall thickness. Right ventricular systolic function is normal. Left Atrium: Left atrial size was normal in size. Right Atrium: Right atrial size was normal in size. Pericardium: There is no evidence of pericardial effusion. Mitral Valve: The mitral valve is normal in structure. No evidence of mitral valve regurgitation. Tricuspid Valve: The tricuspid valve is normal in structure. Tricuspid valve regurgitation is trivial. Aortic Valve: The aortic valve is normal in structure. Aortic valve regurgitation is not visualized. Aortic valve mean gradient measures 5.0 mmHg. Aortic valve peak gradient measures 9.0 mmHg. Aortic valve area, by VTI measures 2.19 cm. Pulmonic Valve: The pulmonic valve was normal in structure. Pulmonic valve regurgitation is not visualized. Aorta: The ascending aorta was not well visualized. IAS/Shunts: No atrial level shunt detected by color flow Doppler.  LEFT VENTRICLE PLAX 2D LVIDd:         5.24 cm   Diastology LVIDs:         3.95 cm   LV e' medial:    4.79 cm/s LV PW:         1.53 cm   LV E/e' medial:  17.3 LV IVS:        0.93 cm   LV e' lateral:   7.51 cm/s LVOT diam:     2.00 cm   LV E/e' lateral: 11.1 LV SV:         51 LV SV Index:   32 LVOT Area:     3.14 cm  RIGHT VENTRICLE RV Basal diam:  2.79 cm RV S prime:     26.90 cm/s LEFT ATRIUM           Index        RIGHT ATRIUM           Index LA diam:      3.80 cm 2.42 cm/m   RA Area:     15.10 cm LA Vol (A2C): 33.8  ml 21.57 ml/m  RA Volume:   38.80 ml  24.76 ml/m  AORTIC VALVE AV Area (Vmax):    1.94 cm AV Area (Vmean):   1.77 cm AV Area (VTI):     2.19 cm AV Vmax:           150.00 cm/s AV Vmean:          110.000 cm/s AV VTI:            0.232 m AV Peak Grad:      9.0 mmHg AV Mean Grad:      5.0 mmHg LVOT Vmax:         92.70 cm/s LVOT Vmean:        61.900 cm/s LVOT VTI:          0.162 m LVOT/AV VTI ratio: 0.70  AORTA Ao Root diam: 3.10 cm MITRAL VALVE MV Area (PHT): 10.25 cm   SHUNTS MV Decel Time: 74 msec     Systemic VTI:  0.16 m MV E velocity: 83.05 cm/s  Systemic Diam: 2.00 cm MV A velocity:  78.65 cm/s MV E/A ratio:  1.06 Yolonda Kida MD Electronically signed by Yolonda Kida MD Signature Date/Time: 07/20/2022/5:44:47 PM    Final      Radiology: [place] EKG SITE RITE  Result Date: 07/21/2022 If Site Rite image not attached, placement could not be confirmed due to current cardiac rhythm.  DG Abd 1 View  Result Date: 07/21/2022 CLINICAL DATA:  [id number].  Abdominal pain EXAM: ABDOMEN - 1 VIEW COMPARISON:  06/21/2015 FINDINGS: Multiple surgical skin staples overlie the lower abdomen. Normal abdominal gas pattern. No gross free intraperitoneal gas. Cholecystectomy clips are seen in the right upper quadrant. Surgical drain overlies the mid pelvis. No organomegaly. Osseous structures are age-appropriate. IMPRESSION: Normal abdominal gas pattern. Electronically Signed   By: Fidela Salisbury M.D.   On: 07/21/2022 00:36   DG Chest 1 View  Result Date: 07/21/2022 CLINICAL DATA:  Abdominal pain.  [id number] EXAM: CHEST  1 VIEW COMPARISON:  02/23/2021 FINDINGS: Lungs volumes are small, but are symmetric and are clear. No pneumothorax or pleural effusion. Cardiac size within normal limits. Pulmonary vascularity is normal. Osseous structures are age-appropriate. No acute bone abnormality. IMPRESSION: No active disease. Electronically Signed   By: Fidela Salisbury M.D.   On: 07/21/2022 00:35   ECHOCARDIOGRAM COMPLETE  Result  Date: 07/20/2022    ECHOCARDIOGRAM REPORT   Patient Name:   KOURTLYN CHARLET Date of Exam: 07/20/2022 Medical Rec #:  [id number]   Height:       63.0 in Accession #:    [id number]  Weight:       122.0 lb Date of Birth:  06/11/1946   BSA:          1.567 m Patient Age:    59 years    BP:           105/73 mmHg Patient Gender: F           HR:           126 bpm. Exam Location:  [facility] Procedure: 2D Echo, Color Doppler and Cardiac Doppler Indications:     I21.4 NSTEMI  History:         Patient has no prior history of Echocardiogram examinations.                  Arrythmias:Tachycardia; Risk Factors:Hypertension, Diabetes and                  Dyslipidemia.  Sonographer:     Charmayne Sheer Referring Phys:  [id number] [place] Diagnosing Phys: Yolonda Kida MD  Sonographer Comments: No subcostal window. IMPRESSIONS  1. Left ventricular ejection fraction, by estimation, is 45 to 50%. The left ventricle has mildly decreased function. The left ventricle demonstrates global hypokinesis. The left ventricular internal cavity size was mildly to moderately dilated. Left ventricular diastolic parameters were normal.  2. Right ventricular systolic function is normal. The right ventricular size is normal.  3. The mitral valve is normal in structure. No evidence of mitral valve regurgitation.  4. The aortic valve is normal in structure. Aortic valve regurgitation is not visualized. FINDINGS  Left Ventricle: Left ventricular ejection fraction, by estimation, is 45 to 50%. The left ventricle has mildly decreased function. The left ventricle demonstrates global hypokinesis. The left ventricular internal cavity size was mildly to moderately dilated. There is borderline left ventricular hypertrophy. Left ventricular diastolic parameters were normal. Right Ventricle: The right ventricular size is normal. No increase in right ventricular wall thickness. Right ventricular systolic  function is normal. Left Atrium: Left atrial size was normal in  size. Right Atrium: Right atrial size was normal in size. Pericardium: There is no evidence of pericardial effusion. Mitral Valve: The mitral valve is normal in structure. No evidence of mitral valve regurgitation. Tricuspid Valve: The tricuspid valve is normal in structure. Tricuspid valve regurgitation is trivial. Aortic Valve: The aortic valve is normal in structure. Aortic valve regurgitation is not visualized. Aortic valve mean gradient measures 5.0 mmHg. Aortic valve peak gradient measures 9.0 mmHg. Aortic valve area, by VTI measures 2.19 cm. Pulmonic Valve: The pulmonic valve was normal in structure. Pulmonic valve regurgitation is not visualized. Aorta: The ascending aorta was not well visualized. IAS/Shunts: No atrial level shunt detected by color flow Doppler.  LEFT VENTRICLE PLAX 2D LVIDd:         5.24 cm   Diastology LVIDs:         3.95 cm   LV e' medial:    4.79 cm/s LV PW:         1.53 cm   LV E/e' medial:  17.3 LV IVS:        0.93 cm   LV e' lateral:   7.51 cm/s LVOT diam:     2.00 cm   LV E/e' lateral: 11.1 LV SV:         51 LV SV Index:   32 LVOT Area:     3.14 cm  RIGHT VENTRICLE RV Basal diam:  2.79 cm RV S prime:     26.90 cm/s LEFT ATRIUM           Index        RIGHT ATRIUM           Index LA diam:      3.80 cm 2.42 cm/m   RA Area:     15.10 cm LA Vol (A2C): 33.8 ml 21.57 ml/m  RA Volume:   38.80 ml  24.76 ml/m  AORTIC VALVE AV Area (Vmax):    1.94 cm AV Area (Vmean):   1.77 cm AV Area (VTI):     2.19 cm AV Vmax:           150.00 cm/s AV Vmean:          110.000 cm/s AV VTI:            0.232 m AV Peak Grad:      9.0 mmHg AV Mean Grad:      5.0 mmHg LVOT Vmax:         92.70 cm/s LVOT Vmean:        61.900 cm/s LVOT VTI:          0.162 m LVOT/AV VTI ratio: 0.70  AORTA Ao Root diam: 3.10 cm MITRAL VALVE MV Area (PHT): 10.25 cm   SHUNTS MV Decel Time: 74 msec     Systemic VTI:  0.16 m MV E velocity: 83.05 cm/s  Systemic Diam: 2.00 cm MV A velocity: 78.65 cm/s MV E/A ratio:  1.06 Dwayne D  Callwood MD Electronically signed by Yolonda Kida MD Signature Date/Time: 07/20/2022/5:44:47 PM    Final     ECHO (stress echo) 02/14/2017  Ref Range & Units 5 yr ago  LV Ejection Fraction (%)  55   Aortic Valve Regurgitation Grade  none   Aortic Valve Stenosis Grade  none   Mitral Valve Regurgitation Grade  trivial   Mitral Valve Stenosis Grade  none   Tricuspid Valve Regurgitation Grade  trivial  Tricuspid Valve Regurgitation Max Velocity (m/s) m/sec 1.4   Right Ventricle Systolic Pressure (mmHg) mmHg 12.4   Narrative                     CARDIOLOGY DEPARTMENT                   [place], [place]                      [id number]                    [facility]                 Acct #: [id number]          [address] [place], [place] [address]       Date: 02/14/2017 02:57 PM                                                             Adult Female Age: 71 yrs                     ECHOCARDIOGRAM REPORT                   Outpatient                                                             [id number]            STUDY:Stress Echo        TAPE:                   MD1:  DRANE, ANNA MARIA             ECHO:Yes   DOPPLER:Yes  FILE:[id number]       BP: 148/83 mmHg            COLOR:Yes  CONTRAST:No      MACHINE:[place]      Height: 63 in        RV BIOPSY:No         3D:No   SOUND QLTY:Moderate     Weight: 174 lb           MEDIUM:None                                       BSA: 1.8 m2   ___________________________________________________________________________________________     HISTORY:Chest pain      REASON:Assess, LV function  Indication:R07.89 Other chest pain, I10 Essential (primary) hypertension, R06.02             Shortness of breath  ___________________________________________________________________________________________   STRESS ECHOCARDIOGRAPHY            Protocol:Treadmill Darnell Level)              Drugs:None  Target Heart Rate:128  bpm        Maximum Predicted Heart Rate: 150 bpm   +-------------------+-------------------------+-------------------------+------------+         Stage            Duration (mm:ss)         Heart Rate (bpm)         BP       +-------------------+-------------------------+-------------------------+------------+        RESTING                                           85              148/83     +-------------------+-------------------------+-------------------------+------------+        EXERCISE                4:49                     210                /        +-------------------+-------------------------+-------------------------+------------+        RECOVERY                8:05                      94              176/88     +-------------------+-------------------------+-------------------------+------------+     Stress Duration:4:49 mm:ss    Max Stress H.R.:210 bpm        Target Heart Rate Achieved: Yes   ___________________________________________________________________________________________   WALL SEGMENT CHANGES                         Rest          Stress  Anterior Septum Basal:Normal        Hyperkinetic                    NLZ:JQBHAL        Hyperkinetic                 Apical:Normal        Hyperkinetic     Anterior Wall Basal:Normal        Hyperkinetic                    PFX:TKWIOX        Hyperkinetic                 Apical:Normal        Hyperkinetic      Lateral Wall Basal:Normal        Hyperkinetic                    BDZ:HGDJME        Hyperkinetic                 Apical:Normal        Hyperkinetic    Posterior Wall Basal:Normal        Hyperkinetic                    QAS:TMHDQQ        Hyperkinetic     Inferior Wall Basal:Normal  Hyperkinetic                    QPY:PPJKDT        Hyperkinetic                 Apical:Normal        Hyperkinetic   Inferior Septum Basal:Normal        Hyperkinetic                    OIZ:TIWPYK        Hyperkinetic               Resting EF:>55% (Est.)   Stress EF: >55% (Est.)   ___________________________________________________________________________________________   ADDITIONAL FINDINGS    ___________________________________________________________________________________________   STRESS ECG RESULTS                                                ECG Results:Normal   ___________________________________________________________________________________________  ECHOCARDIOGRAPHIC DESCRIPTIONS   LEFT VENTRICLE         Size:Normal  Contraction:Normal    LV Masses:No Masses          DXI:PJAS   RIGHT VENTRICLE         Size:Normal                Free Wall:Normal  Contraction:Normal                RV Masses:No mass   PERICARDIUM        Fluid:No effusion    _______________________________________________________________________________________  DOPPLER ECHO and OTHER SPECIAL PROCEDURES     Aortic:No AR                      No AS      Mitral:TRIVIAL MR                 No MS            MV Inflow E Vel=nm*        MV Annulus E'Vel=nm*            E/E'Ratio=nm*   Tricuspid:TRIVIAL TR                 No TS            136.0 cm/sec peak TR vel   12.4 mmHg peak RV pressure   Pulmonary:No PR                      No PS     ___________________________________________________________________________________________  ECHOCARDIOGRAPHIC MEASUREMENTS  2D DIMENSIONS  AORTA             Values      Normal Range      MAIN PA          Values      Normal Range            Annulus:  nm*       [2.1 - 2.5]                PA Main:  nm*       [1.5 - 2.1]          Aorta Sin:  nm*       [2.7 - 3.3]       RIGHT VENTRICLE  ST Junction:  nm*       [2.3 - 2.9]                RV Base:  nm*       [ < 4.2]          Asc.Aorta:  nm*       [2.3 - 3.1]                 RV Mid:  nm*       [ < 3.5]   LEFT VENTRICLE                                        RV Length:  nm*       [ < 8.6]              LVIDd:  nm*       [3.9  - 5.3]       INFERIOR VENA CAVA              LVIDs:  nm*                                 Max. IVC:  nm*       [ <= 2.1]                 FS:  nm*       [> 25]                    Min. IVC:  nm*                SWT:  nm*       [0.5 - 0.9]                   ------------------                PWT:  nm*       [0.5 - 0.9]                   nm* - not measured   LEFT ATRIUM            LA Diam:  nm*       [2.7 - 3.8]        LA A4C Area:  nm*       [ < 20]          LA Volume:  nm*       [22 - 52]    ___________________________________________________________________________________________  INTERPRETATION  Normal Stress Echocardiogram  NORMAL RIGHT VENTRICULAR SYSTOLIC FUNCTION  TRIVIAL REGURGITATION NOTED (See above)  NO VALVULAR STENOSIS NOTED    ___________________________________________________________________________________________  Electronically signed by: MD Miquel Dunn on 02/15/2017 10:07 AM              Performed By: Johnathan Hausen, RDCS, RVT        Ordering Physician: Clabe Seal   TELEMETRY reviewed by me (LT) 07/21/2022 : on 8/23 Sinus tachycardia with frequent premature atrial contractions and occasional premature ventricular contractions, rate 120s Evening of 8/23: developed AF RVR rate 160s  EKG reviewed by me and Dr. Saralyn Pilar: 8/23 sinus tachycardia with rate 118 nonspecific T wave changes, repeat at 12:00 without significant changes. 1700 on 8/23, AF RVR with nonspecific ST changes but no stemi  per Dr. Saralyn Pilar  Data reviewed by me (LT) 07/21/2022: ordered CBC, BMP, serial troponin   ASSESSMENT AND PLAN:  WANIYA HOGLUND is a 37yoF with a PMH of hypertension, Graves' disease s/p XRT, type 2 diabetes, IBS, diverticulitis with abscess formation s/p CT-guided drain placement in early June 2023 who presented to [facility] for scheduled laparoscopic sigmoidectomy on 07/19/2022.  The evening following the procedure she reportedly became hypotensive and tachycardic with SBP in the 90s.  CCM  was consulted and the patient was transferred to the ICU early morning of 8/23.  Her blood pressure improved on recheck with systolic in the low 425Z and a MAP of 80.  She did not require vasopressor support and was actually given her home antihypertensives on 8/23.  Cardiology is consulted the afternoon of 8/23 due to concern for an abnormal EKG and elevated troponin to 7000, concern for perioperative NSTEMI.  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 Lysle Pearl, the surgery ultimately was converted to an open Hartman's procedure and she is now s/p colostomy.  Overnight on the floor she became hypotensive and tachycardic, and was transferred to the ICU for closer monitoring.  Ultimately she did not require vasopressor support and blood pressures been stable throughout the day.  A troponin was checked which was significantly elevated at 7000, and repeat at 9500.  EKGs read as acute MI by the computer, confirmed with Dr. Clayborn Bigness not to be a STEMI, but with nonspecific T wave changes and poor R wave progression, overall not significantly changed from prior in July 2023. -Concern for perioperative NSTEMI with current troponin peak at 13,000 and newly reduced ejection fraction & LV global hypokinesis -Agree with current therapy per surgery and critical care -Trend troponin until peak -S/p 325 mg aspirin, continue 81 mg aspirin daily  -Per surgery, heparin GTT okay, continue this while inpatient for ACS and AC with her new onset A-fib as below. -echocardiogram complete resulted with reduced EF 45-50% (previous 55% 2018) -S/p Neo-Synephrine overnight, drip turned off this morning. -Hold metoprolol XL 50  mg once daily with relative hypotension, restart as able -Hold home antihypertensives irbesartan 150 mg, amlodipine 10 mg and HCTZ 12.'5mg'$  -Continue atorvastatin 40 mg once daily -Monitor and replete electrolytes for a magnesium >2, potassium >4 -Continuous monitoring on telemetry while inpatient -We will consider further ischemic work-up pending clinical course, but favor conservative management from a cardiac standpoint, per Dr. Saralyn Pilar  #New onset paroxysmal atrial fibrillation with RVR #hypothyroidism (History of Graves' disease s/p XRT) Developed the evening of 06/2022, heart rates in the 160s, s/p amiodarone bolus x2 and remains on continuous infusion.  Heparin drip started evening of 8/23, okay with surgery. -Continue amiodarone gtt. for now, will transition to p.o. once okay to take p.o. meds -Continue heparin GTT while inpatient, will likely transition to [place] at discharge.  CHA2DS2-VASc 6 (age, sex, chf, htn, MI) -Agree with thyroid panel, hold levothyroxine   This patient's plan of care was discussed and created with Dr. Saralyn Pilar and he is in agreement.  Signed: Tristan Schroeder , PA-C 07/21/2022, 7:47 AM [facility]

## 2022-07-21 NOTE — Progress Notes (Signed)
[place] for Electrolyte Monitoring and Replacement   Recent Labs: Potassium (mmol/L)  Date Value  07/21/2022 3.7  01/12/2013 3.8   Magnesium (mg/dL)  Date Value  07/21/2022 2.6 (H)   Calcium (mg/dL)  Date Value  07/21/2022 7.4 (L)   Calcium, Total (mg/dL)  Date Value  01/12/2013 9.6   Albumin (g/dL)  Date Value  06/05/2022 2.6 (L)   Phosphorus (mg/dL)  Date Value  07/20/2022 3.4   Sodium (mmol/L)  Date Value  07/21/2022 140  01/12/2013 139   Corrected Ca   Assessment: 76 y.o. female  who, per internal medicine note dated 03/30/22 has history of DM, HTN, grave's disease, diverticulitis who presented to [facility] for scheduled laparoscopic colon resection on 07/19/2022. Pharmacy is asked to follow and replace electrolytes while in CCU  Goal of Therapy:  Potassium 4.0 - 5.1 mmol/L Magnesium 2.0 - 2.4 mg/dL All Other Electrolytes WNL  Plan:  10 mEq IV KCl x 2 Repeat electrolytes in am  Dallie Piles ,PharmD Clinical Pharmacist 07/21/2022 7:08 AM

## 2022-07-21 NOTE — Consult Note (Signed)
[place] Nurse ostomy consult note Pouch was leaking upon my arrival; minimally at 3 o'clock  Stoma type/location: LLQ, end colostomy  Stomal assessment/size: 1 3/4" round, budded, pink, moist Peristomal assessment: intact  Treatment options for stomal/peristomal skin: 2" skin barrier  Output liquid brown stool  Ostomy pouching: 2pc. 2 3/4" with 2" skin barrier, 2 extra left in room with educational materials  Education provided:  Explained role of ostomy nurse and creation of stoma  Demonstrated pouch change and lock and roll closure; however patient is very sleepy and falling off to sleep during my visit. Learned from patient she was primary CG for her husband who was placed into inpatient hospice just prior to her planned surgery.   She did not want to visualize the stoma today Enrolled patient in [place] program: Yes   [place] Nurse will follow along with you for continued support with ostomy teaching and care [place] MSN, [place], [place], [place], [place]

## 2022-07-21 NOTE — Progress Notes (Signed)
Subjective:  CC: Sarah [name] is a 76 y.o. female  Hospital stay day 2, 2 Days Post-Op robotic sigmoidectomy converted to open Hartman's procedure  HPI: Having some nausea this am, but no emesis.  No change in pain.  ROS:  General: Denies weight loss, weight gain, fatigue, fevers, chills, and night sweats. Heart: Denies chest pain, palpitations, racing heart, irregular heartbeat, leg pain or swelling, and decreased activity tolerance. Respiratory: Denies breathing difficulty, shortness of breath, wheezing, cough, and sputum. GI: Denies change in appetite, heartburn, nausea, vomiting, constipation, diarrhea, and blood in stool. GU: Denies difficulty urinating, pain with urinating, urgency, frequency, blood in urine.   Objective:   Temp:  [97.9 F (36.6 C)-98.2 F (36.8 C)] 97.9 F (36.6 C) (08/24 0400) Pulse Rate:  [51-132] 101 (08/24 0700) Resp:  [15-26] 22 (08/24 0700) BP: (87-125)/(48-77) 121/60 (08/24 0700) SpO2:  [91 %-100 %] 98 % (08/24 0700)     Height: 5' 2.99" (160 cm) Weight: 55.3 kg BMI (Calculated): 21.62   Intake/Output this shift:   Intake/Output Summary (Last 24 hours) at 07/21/2022 0736 Last data filed at 07/21/2022 0600 Gross per 24 hour  Intake 2682.47 ml  Output 610 ml  Net 2072.47 ml    Constitutional :  alert, cooperative, appears stated age, and no distress  Respiratory:  clear to auscultation bilaterally  Cardiovascular:  Tachycardic  Gastrointestinal: Soft, no guarding, generalized abdominal TTP as expected based on extent of surery . Ostomy pink, patent, stool in bag  Skin: Cool and moist. Incisions c/d/i  Psychiatric: Normal affect, non-agitated, not confused       LABS:     Latest Ref Rng & Units 07/21/2022    3:06 AM 07/20/2022    8:32 PM 07/20/2022    6:08 PM  CMP  Glucose 70 - 99 mg/dL 156  163    BUN 8 - 23 mg/dL 23  24    Creatinine 0.44 - 1.00 mg/dL 1.01  1.16    Sodium 135 - 145 mmol/L 140  142    Potassium 3.5 - 5.1 mmol/L 3.7  3.3   3.2   Chloride 98 - 111 mmol/L 113  112    CO2 22 - 32 mmol/L 21  22    Calcium 8.9 - 10.3 mg/dL 7.4  7.3        Latest Ref Rng & Units 07/21/2022    3:06 AM 07/20/2022    8:32 PM 07/20/2022   11:55 AM  CBC  WBC 4.0 - 10.5 K/uL 11.8  11.3    Hemoglobin 12.0 - 15.0 g/dL 9.4  8.3  9.4   Hematocrit 36.0 - 46.0 % 30.6  28.1  30.6   Platelets 150 - 400 K/uL 351  305      RADS: N/a Assessment:   S/p robotic sigmoidectomy converted to open Hartman's procedure for complicated diverticulitis.  GI exam still as expected at this point.  Continue ice chips only for now until nausea better, will have low threshold to place NG.  Continued decompression of stool in ostomy is reassuring.   Appreciate cards and ICU support in the meantime for likely post op sepsis, NSTEMI.   labs/images/medications/previous chart entries reviewed personally and relevant changes/updates noted above.

## 2022-07-21 NOTE — Progress Notes (Signed)
NAME:  Sarah [name], MRN:  [id number], DOB:  07-21-1946, LOS: 2 ADMISSION DATE:  07/19/2022, CONSULTATION DATE:  07/20/22 REFERRING MD:  Benjamine Sprague, DO  REASON FOR CONSULT: Hypotension    HPI  Sarah [name] is a 81yoF with a PMH of hypertension, Graves' disease s/p XRT, type 2 diabetes, IBS, diverticulitis with abscess formation s/p CT-guided drain placement in early June 2023 who presented to [facility] for scheduled laparoscopic sigmoidectomy on 07/19/2022.  Hospital Course: Patient underwent robotic assisted laparoscopic sigmoidectomy, converted to open hartman's procedure. Patient was transferred to medical floor post -op. Overnight patient noted to have decreased urine output, hypotensive and tachycardic.  Due to persistent hypotension despite IV fluid resuscitation, patient was started on peripheral Levophed and transferred to the ICU.  PCCM consulted to assist with management. On arrival to the ICU, vital signs showed blood pressure of 98/60 mm Hg, tachycardia of 115 beats per minute, a respiratory rate of 22 breaths per minute, and a temperature of 98.60F (36.7.C).  Patient remained hemodynamically stable not requiring pressors.   Repeat Pertinent Labs Findings: Chemistry:Na+/ K+: 140/2.8 (142/3.3) Glucose:184  BUN/Cr.:23/1.14 (24/1.16) CBC: WBC:5.4 Hgb/Hct:9.6/30.8:  Other Lab findings:   PCT: 1.39 Lactic acid: 3.4 COVID PCR: Negative, Troponin:  6.999>9.494>8.837>11,207 EKG:  sinus tachycardia with rate 118 with premature atrial contractions and premature ventricular contractions, nonspecific ST changes with poor R wave progression.  Pt developed new onset atrial fibrillation with rvr at 1740 heart rate 130-180's.  Amiodarone bolus followed by continuous amiodarone gtt administered.  Pts troponin's trending back up to 11,207 unable to anticoagulate pt s/p robotic sigmoidectomy converted to open Hartman's procedure. Cardiology was consulted. Initially holding heparin per surgery however, patient  noted with worsening symptoms of nausea, Afib RVR with HR's in the 170's despite being on Amiodarone. She was more lethargic drifting in and out of somnolence. Decision made to restart Heparin after further discussion with General surgery due to high risk for decompensation and intubation. PCCM re-consulted.  Past Medical History  Hypertension, Graves' disease s/p XRT, type 2 diabetes, IBS, diverticulitis with abscess formation s/p CT-guided drain placement in early June 2023  [place] Hospital Events   8/22:S/p robotic sigmoidectomy converted to open Hartman's procedure for complicated diverticulitis. 8/23: Transferred to ICU for hypotension. PCCM consulted for pressor.  Developed Afib RVR with elevated troponin concerns for NSTEMI. Cardiology consulted.  Pt received amiodarone bolus followed by amiodarone gtt.  Overnight required additional amiodarone bolus due to recurrent atrial fibrillation with rvr hr 170's and pt symptomatic.  8/24: Pt remains on amiodarone gtt 30 mg/hr and heparin gtt converted to nsr hr 101.  Requiring neo-synephrine gtt a 10 mcg/min to maintain map >65  Consults:  PCCM Cardiology  Procedures:  8/22: Robotic assisted laparoscopic sigmoidectomy, converted to open hartman's procedure   Significant Diagnostic Tests:  8/24: Chest Xray>No acute abnormality 8/24: Abdominal xray>No acute abnormality  Micro Data:  8/24: MRSA PCR>>  Antimicrobials:  Vancomycin 8/24> Ceftriaxone 8/22> Metronidazole 8/22 >  OBJECTIVE  Blood pressure 121/60, pulse (!) 101, temperature 97.9 F (36.6 C), temperature source Oral, resp. rate (!) 22, height 5' 2.99" (1.6 m), weight 55.3 kg, SpO2 98 %.       Intake/Output Summary (Last 24 hours) at 07/21/2022 0709 Last data filed at 07/21/2022 0600 Gross per 24 hour  Intake 2682.47 ml  Output 610 ml  Net 2072.47 ml    Filed Weights   07/19/22 1033  Weight: 55.3 kg   Physical Examination  GENERAL: Acutely-ill  appearing female,  NAD resting in bed on 4L O2 via nasal canula  HEENT: Head atraumatic, normocephalic. Oropharynx and nasopharynx clear.  Supple, no JVD  LUNGS: Clear throughout, even, non labored  CARDIOVASCULAR: Sinus rhythm, rrr, no r/g, 2+ radial/2+ distal pulses, no edema  ABDOMEN: Hypoactive BS x4, soft, tender, LLQ ileostomy pink/patent draining brown stool  EXTREMITIES: No pedal edema, cyanosis, or clubbing.  NEUROLOGIC:Alert and oriented, follows commands, PERRLA SKIN: Midline abdominal incision dressing intact with dried drainage; RLQ JP drain   Labs/imaging that I havepersonally reviewed  (right click and "Reselect all SmartList Selections" daily)     Labs   CBC: Recent Labs  Lab 07/20/22 0327 07/20/22 1155 07/20/22 2032 07/21/22 0306  WBC 5.4  --  11.3* 11.8*  HGB 9.6* 9.4* 8.3* 9.4*  HCT 30.8* 30.6* 28.1* 30.6*  MCV 88.8  --  91.5 89.7  PLT 256  --  305 351     Basic Metabolic Panel: Recent Labs  Lab 07/20/22 0327 07/20/22 1344 07/20/22 1742 07/20/22 1808 07/20/22 2032 07/21/22 0306  NA 140  --  142  --  142 140  K 2.8* 3.0* 3.2* 3.2* 3.3* 3.7  CL 112*  --  112*  --  112* 113*  CO2 21*  --  19*  --  22 21*  GLUCOSE 184*  --  149*  --  163* 156*  BUN 15  --  23  --  24* 23  CREATININE 0.81  --  1.14*  --  1.16* 1.01*  CALCIUM 7.3*  --  7.4*  --  7.3* 7.4*  MG 1.5* 2.1  --   --  2.0 2.6*  PHOS  --   --   --  3.4  --   --     GFR: Estimated Creatinine Clearance: 39.2 mL/min (A) (by C-G formula based on SCr of 1.01 mg/dL (H)). Recent Labs  Lab 07/20/22 0327 07/20/22 2032 07/20/22 2141 07/20/22 2324 07/21/22 0306  PROCALCITON  --   --  1.39  --  1.37  WBC 5.4 11.3*  --   --  11.8*  LATICACIDVEN  --  3.4*  --  2.3*  --      Liver Function Tests: No results for input(s): "AST", "ALT", "ALKPHOS", "BILITOT", "PROT", "ALBUMIN" in the last 168 hours. No results for input(s): "LIPASE", "AMYLASE" in the last 168 hours. No results for input(s): "AMMONIA" in the last  168 hours.  ABG    Component Value Date/Time   PHART 7.37 07/21/2022 0357   PCO2ART 33 07/21/2022 0357   PO2ART 75 (L) 07/21/2022 0357   HCO3 19.1 (L) 07/21/2022 0357   ACIDBASEDEF 5.3 (H) 07/21/2022 0357   O2SAT 96.4 07/21/2022 0357     Coagulation Profile: Recent Labs  Lab 07/20/22 2141  INR 1.7*    Cardiac Enzymes: No results for input(s): "CKTOTAL", "CKMB", "CKMBINDEX", "TROPONINI" in the last 168 hours.  HbA1C: Hgb A1c MFr Bld  Date/Time Value Ref Range Status  05/30/2022 03:14 AM 8.3 (H) 4.8 - 5.6 % Final    Comment:    (NOTE)         Prediabetes: 5.7 - 6.4         Diabetes: >6.4         Glycemic control for adults with diabetes: <7.0     CBG: Recent Labs  Lab 07/20/22 1131 07/20/22 1815 07/20/22 2011 07/20/22 2153 07/20/22 2340  GLUCAP 128* 155* 147* 158* 159*     Review of Systems:  Past Medical History  She,  has a past medical history of Arthritis, Cancer ([place]), Diabetes mellitus without complication ([place]), Hyperlipidemia, Hypertension, Hypothyroidism, and IBS (irritable bowel syndrome).   Surgical History    Past Surgical History:  Procedure Laterality Date   ABDOMINAL HYSTERECTOMY     CATARACT EXTRACTION W/PHACO Left 12/21/2016   Procedure: CATARACT EXTRACTION PHACO AND INTRAOCULAR LENS PLACEMENT ([place])  left eye;  Surgeon: Leandrew Koyanagi, MD;  Location: [place];  Service: Ophthalmology;  Laterality: Left;  Diabetic - oral meds   CATARACT EXTRACTION W/PHACO Right 01/25/2017   Procedure: CATARACT EXTRACTION PHACO AND INTRAOCULAR LENS PLACEMENT ([place])  right diabetic;  Surgeon: Leandrew Koyanagi, MD;  Location: [place];  Service: Ophthalmology;  Laterality: Right;  diabetic - oral meds   CHOLECYSTECTOMY     COLONOSCOPY WITH PROPOFOL N/A 10/26/2016   Procedure: COLONOSCOPY WITH PROPOFOL;  Surgeon: Manya Silvas, MD;  Location: [facility] ENDOSCOPY;  Service: Endoscopy;  Laterality: N/A;   IR RADIOLOGIST EVAL & MGMT   06/14/2022   KNEE SURGERY Right      Social History   reports that she has never smoked. She has never used smokeless tobacco. She reports that she does not drink alcohol and does not use drugs.   Family History   Her family history is negative for Breast cancer.   Allergies Allergies  Allergen Reactions   Penicillins Anaphylaxis    TOLERATED CEFOTETAN 06/2022   Sulfa Antibiotics Rash     Home Medications  Prior to Admission medications   Medication Sig Start Date End Date Taking? Authorizing Provider  cyanocobalamin 1000 MCG tablet Take 1,000 mcg by mouth daily.   Yes [provider]  glimepiride (AMARYL) 4 MG tablet Take 4 mg by mouth daily with breakfast. 09/29/21 09/29/22 Yes [provider]  levothyroxine (SYNTHROID) 150 MCG tablet Take 150 mcg by mouth daily before breakfast.   Yes [provider]  metFORMIN (GLUCOPHAGE) 500 MG tablet Take 500 mg by mouth 2 (two) times daily with a meal.   Yes [provider]  metoprolol succinate (TOPROL-XL) 50 MG 24 hr tablet Take 50 mg by mouth daily. Take with or immediately following a meal.   Yes [provider]  Olmesartan-Amlodipine-HCTZ 40-10-25 MG TABS Take 1 tablet by mouth 3 (three) times a week. Mon, Wed, Fri   Yes [provider]  ondansetron (ZOFRAN-ODT) 4 MG disintegrating tablet Take 1 tablet (4 mg total) by mouth every 8 (eight) hours as needed for nausea or vomiting. 06/05/22  Yes Arta Silence, MD  potassium chloride (K-DUR) 10 MEQ tablet Take 10 mEq by mouth daily.   Yes [provider]  sertraline (ZOLOFT) 100 MG tablet Take 100 mg by mouth daily.   Yes [provider]  simvastatin (ZOCOR) 20 MG tablet Take 20 mg by mouth daily.   Yes [provider]  venlafaxine (EFFEXOR) 37.5 MG tablet Take 37.5 mg by mouth daily.   Yes [provider]  sodium chloride flush (NS) 0.9 % SOLN 5 mLs by Intracatheter route every 8 (eight) hours. 05/30/22  08/28/22  Benjamine Sprague, DO    Scheduled Meds:  acetaminophen  1,000 mg Oral Q6H   aspirin  81 mg Oral Daily   atorvastatin  40 mg Oral QHS   Chlorhexidine Gluconate Cloth  6 each Topical Daily   etomidate  20 mg Intravenous Once   fentaNYL (SUBLIMAZE) injection  100 mcg Intravenous Once   gabapentin  300 mg Oral BID  insulin aspart  0-15 Units Subcutaneous TID WC   irbesartan  150 mg Oral Daily   levothyroxine  150 mcg Oral Q0600   metoprolol succinate  50 mg Oral Q breakfast   pantoprazole (PROTONIX) IV  40 mg Intravenous QHS   rocuronium bromide  50 mg Intravenous Once   venlafaxine  37.5 mg Oral Q breakfast   Continuous Infusions:  amiodarone 30 mg/hr (07/21/22 0600)   cefTRIAXone (ROCEPHIN)  IV 2 g (07/20/22 1008)   heparin 650 Units/hr (07/21/22 0600)   lactated ringers Stopped (07/21/22 0316)   metronidazole Stopped (07/20/22 2229)   phenylephrine (NEO-SYNEPHRINE) Adult infusion 20 mcg/min (07/21/22 0600)   vancomycin Stopped (07/21/22 0449)   PRN Meds:.HYDROmorphone (DILAUDID) injection, ondansetron **OR** ondansetron (ZOFRAN) IV, oxyCODONE, traMADol   Active Hospital Problem list     Assessment & Plan:   Sepsis secondary to suspected intraabdominal Infection - Prn neo-synephrine gtt to maintain map 65 or higher - Continue maintenance iv fluids    Diverticulitis  s/p robotic sigmoidectomy converted to open Hartman's procedure on 07/19/22 with possible intraabdominal infection  - Trend WBC and monitor fever curve  - Trend PCT - Continue vancomycin, flagyl, and ceftriaxone for now - PRN antiemetic and pain management - Management per general surgery  Elevated troponin's secondary to demand ischemia vs. NSTEMI  New onset atrial fibrillation with rvr    Echo 08/23: EF 45 to 50%, left ventricle demonstrates global hypokinesis  - Continuous telemetry monitoring  - Continue amiodarone and heparin gtts  - Hold antihypertensives and beta blocker therapy for now due to  hypotension requiring vasopressor therapy - Continue aspirin and atorvastatin  - Cardiology consulted appreciate input: considering further ischemic work-up pending clinical course, but favoring conservative management  - Trend troponin's until peaked    Mild AKI in the setting of ATN: Cr gradually worsening 0.80>1.14>1.16 Lactic Acidosis 3.4>2.3 Hypokalemia - Trend BMP  - Replace electrolytes as indicated  - Monitor UOP - Avoid nephrotoxic medications     Type II Diabetes mellitus - CBG's q4hrs - SSI - Hemoglobin A1C pending   Hypothyroidism - Thyroid panel with TSH pending  - Continue Synthroid   Best practice:  Diet:  NPO; will start TPN  Pain/Anxiety/Delirium protocol (if indicated): Yes  VAP protocol (if indicated): Not indicated DVT prophylaxis: Systemic AC GI prophylaxis: PPI Glucose control:  SSI Yes Central venous access: PICC line placement pending  Arterial line:  N/A Foley:  Yes, and it is still needed Mobility:  bed rest  PT consulted: N/A Last date of multidisciplinary goals of care discussion [07/21/22] Code Status:  full code Disposition: ICU  Updated pts brother Johnny Cartelli via telephone per pts request regarding pts condition and plan of care all questions answered   Critical care time: 40 minutes       Donell Beers, [place] Pager [phone] (please enter 7 digits) PCCM Consult Pager [phone] (please enter 7 digits)

## 2022-07-22 ENCOUNTER — Inpatient Hospital Stay: Payer: Medicare Other

## 2022-07-22 DIAGNOSIS — K5792 Diverticulitis of intestine, part unspecified, without perforation or abscess without bleeding: Secondary | ICD-10-CM

## 2022-07-22 LAB — PROCALCITONIN: Procalcitonin: 0.54 ng/mL

## 2022-07-22 LAB — GLUCOSE, CAPILLARY
Glucose-Capillary: 161 mg/dL — ABNORMAL HIGH (ref 70–99)
Glucose-Capillary: 204 mg/dL — ABNORMAL HIGH (ref 70–99)
Glucose-Capillary: 205 mg/dL — ABNORMAL HIGH (ref 70–99)
Glucose-Capillary: 211 mg/dL — ABNORMAL HIGH (ref 70–99)
Glucose-Capillary: 213 mg/dL — ABNORMAL HIGH (ref 70–99)
Glucose-Capillary: 232 mg/dL — ABNORMAL HIGH (ref 70–99)

## 2022-07-22 LAB — CBC
HCT: 25.7 % — ABNORMAL LOW (ref 36.0–46.0)
Hemoglobin: 7.9 g/dL — ABNORMAL LOW (ref 12.0–15.0)
MCH: 27.3 pg (ref 26.0–34.0)
MCHC: 30.7 g/dL (ref 30.0–36.0)
MCV: 88.9 fL (ref 80.0–100.0)
Platelets: 316 10*3/uL (ref 150–400)
RBC: 2.89 MIL/uL — ABNORMAL LOW (ref 3.87–5.11)
RDW: 17.3 % — ABNORMAL HIGH (ref 11.5–15.5)
WBC: 10.4 10*3/uL (ref 4.0–10.5)
nRBC: 0 % (ref 0.0–0.2)

## 2022-07-22 LAB — THYROID PANEL WITH TSH
Free Thyroxine Index: 2.1 (ref 1.2–4.9)
T3 Uptake Ratio: 43 % — ABNORMAL HIGH (ref 24–39)
T4, Total: 4.9 ug/dL (ref 4.5–12.0)
TSH: 0.368 u[IU]/mL — ABNORMAL LOW (ref 0.450–4.500)

## 2022-07-22 LAB — COMPREHENSIVE METABOLIC PANEL
ALT: 23 U/L (ref 0–44)
AST: 47 U/L — ABNORMAL HIGH (ref 15–41)
Albumin: 1.8 g/dL — ABNORMAL LOW (ref 3.5–5.0)
Alkaline Phosphatase: 47 U/L (ref 38–126)
Anion gap: 5 (ref 5–15)
BUN: 24 mg/dL — ABNORMAL HIGH (ref 8–23)
CO2: 22 mmol/L (ref 22–32)
Calcium: 7.8 mg/dL — ABNORMAL LOW (ref 8.9–10.3)
Chloride: 110 mmol/L (ref 98–111)
Creatinine, Ser: 0.61 mg/dL (ref 0.44–1.00)
GFR, Estimated: >60 mL/min (ref >60)
Glucose, Bld: 247 mg/dL — ABNORMAL HIGH (ref 70–99)
Potassium: 3.5 mmol/L (ref 3.5–5.1)
Sodium: 137 mmol/L (ref 135–145)
Total Bilirubin: 0.3 mg/dL (ref 0.3–1.2)
Total Protein: 5.1 g/dL — ABNORMAL LOW (ref 6.5–8.1)

## 2022-07-22 LAB — PHOSPHORUS: Phosphorus: 2.3 mg/dL — ABNORMAL LOW (ref 2.5–4.6)

## 2022-07-22 LAB — MAGNESIUM: Magnesium: 1.8 mg/dL (ref 1.7–2.4)

## 2022-07-22 LAB — BRAIN NATRIURETIC PEPTIDE: B Natriuretic Peptide: 1664.5 pg/mL — ABNORMAL HIGH (ref 0.0–100.0)

## 2022-07-22 LAB — HEMOGLOBIN AND HEMATOCRIT, BLOOD
HCT: 25.3 % — ABNORMAL LOW (ref 36.0–46.0)
Hemoglobin: 7.9 g/dL — ABNORMAL LOW (ref 12.0–15.0)

## 2022-07-22 LAB — HEPARIN LEVEL (UNFRACTIONATED)
Heparin Unfractionated: 0.1 IU/mL — ABNORMAL LOW (ref 0.30–0.70)
Heparin Unfractionated: 0.12 IU/mL — ABNORMAL LOW (ref 0.30–0.70)

## 2022-07-22 LAB — TRIGLYCERIDES: Triglycerides: 168 mg/dL — ABNORMAL HIGH (ref <150)

## 2022-07-22 MED ORDER — AMIODARONE HCL 200 MG PO TABS
200.0000 mg | ORAL_TABLET | Freq: Every day | ORAL | Status: DC
  Administered 2022-07-26 – 2022-08-01 (×7): 200 mg via ORAL
  Filled 2022-07-22 (×8): qty 1

## 2022-07-22 MED ORDER — POTASSIUM CHLORIDE 10 MEQ/100ML IV SOLN
10.0000 meq | Freq: Once | INTRAVENOUS | Status: AC
  Administered 2022-07-22: 10 meq via INTRAVENOUS
  Filled 2022-07-22: qty 100

## 2022-07-22 MED ORDER — SERTRALINE HCL 50 MG PO TABS
100.0000 mg | ORAL_TABLET | Freq: Every day | ORAL | Status: DC
  Administered 2022-07-23 – 2022-08-01 (×10): 100 mg via ORAL
  Filled 2022-07-22 (×10): qty 2

## 2022-07-22 MED ORDER — HEPARIN BOLUS VIA INFUSION
1700.0000 [IU] | Freq: Once | INTRAVENOUS | Status: AC
  Administered 2022-07-22: 1700 [IU] via INTRAVENOUS
  Filled 2022-07-22: qty 1700

## 2022-07-22 MED ORDER — HEPARIN BOLUS VIA INFUSION
1500.0000 [IU] | Freq: Once | INTRAVENOUS | Status: AC
  Administered 2022-07-22: 1500 [IU] via INTRAVENOUS
  Filled 2022-07-22: qty 1500

## 2022-07-22 MED ORDER — MAGNESIUM SULFATE IN D5W 1-5 GM/100ML-% IV SOLN
1.0000 g | Freq: Once | INTRAVENOUS | Status: AC
Start: 2022-07-22 — End: 2022-07-22
  Administered 2022-07-22: 1 g via INTRAVENOUS
  Filled 2022-07-22: qty 100

## 2022-07-22 MED ORDER — ORAL CARE MOUTH RINSE: 15.0000 mL | OROMUCOSAL | Status: DC | PRN

## 2022-07-22 MED ORDER — AMIODARONE HCL 200 MG PO TABS
200.0000 mg | ORAL_TABLET | Freq: Two times a day (BID) | ORAL | Status: AC
  Administered 2022-07-22 – 2022-07-25 (×8): 200 mg via ORAL
  Filled 2022-07-22 (×8): qty 1

## 2022-07-22 MED ORDER — FUROSEMIDE 10 MG/ML IJ SOLN
20.0000 mg | Freq: Two times a day (BID) | INTRAMUSCULAR | Status: AC
Start: 2022-07-22 — End: 2022-07-22
  Administered 2022-07-22 (×2): 20 mg via INTRAVENOUS
  Filled 2022-07-22 (×2): qty 2

## 2022-07-22 MED ORDER — TRACE MINERALS CU-MN-SE-ZN 300-55-60-3000 MCG/ML IV SOLN
INTRAVENOUS | Status: AC
  Filled 2022-07-22: qty 533.13

## 2022-07-22 MED ORDER — METOPROLOL TARTRATE 25 MG PO TABS
12.5000 mg | ORAL_TABLET | Freq: Two times a day (BID) | ORAL | Status: DC
  Administered 2022-07-22 – 2022-07-23 (×3): 12.5 mg via ORAL
  Filled 2022-07-22 (×3): qty 1

## 2022-07-22 MED ORDER — AMIODARONE IV BOLUS ONLY 150 MG/100ML
150.0000 mg | Freq: Once | INTRAVENOUS | Status: AC
  Administered 2022-07-22: 150 mg via INTRAVENOUS

## 2022-07-22 MED ORDER — BOOST / RESOURCE BREEZE PO LIQD CUSTOM
1.0000 | Freq: Three times a day (TID) | ORAL | Status: DC
  Administered 2022-07-23: 1 via ORAL

## 2022-07-22 NOTE — Consult Note (Signed)
ANTICOAGULATION CONSULT NOTE  Pharmacy Consult for IV Heparin Indication: chest pain/ACS  Patient Measurements: Height: 5' 2.99" (160 cm) Weight: 55.3 kg (122 lb) IBW/kg (Calculated) : 52.38 Heparin Dosing Weight: 55.3 kg  Labs: Recent Labs    07/20/22 2032 07/20/22 2141 07/21/22 0306 07/21/22 0656 07/21/22 1416 07/21/22 1638 07/21/22 1947 07/22/22 0540 07/22/22 1500  HGB 8.3*  --  9.4*  --   --   --   --  7.9* 7.9*  HCT 28.1*  --  30.6*  --   --   --   --  25.7* 25.3*  PLT 305  --  351  --   --   --   --  316  --   APTT  --  44*  --   --   --   --   --   --   --   LABPROT  --  19.5*  --   --   --   --   --   --   --   INR  --  1.7*  --   --   --   --   --   --   --   HEPARINUNFRC  --   --   --    < >  --  <0.10*  --  0.10* 0.12*  CREATININE 1.16*  --  1.01*  --   --   --   --  0.61  --   TROPONINIHS  --   --   --    < > 13,331* 11,961* 10,573*  --   --    < > = values in this interval not displayed.   Estimated Creatinine Clearance: 49.5 mL/min (by C-G formula based on SCr of 0.61 mg/dL). Medical History: Past Medical History:  Diagnosis Date   Arthritis    hands   Cancer ([place])    thyroid- radiation   Diabetes mellitus without complication (HCC)    Hyperlipidemia    Hypertension    Hypothyroidism    IBS (irritable bowel syndrome)    Medications:  No anticoagulation prior to admission per my chart review  Assessment: Patient is a 76 y/o F with medical history as above who presented to the hospital 8/22 with chronic diverticulitis for laparoscopic sigmoidectomy converted to open Hartmann's procedure. POD # 1 patient declined and critical care medicine was consulted for hemodynamic instability with decreased urine output. Cardiology consulted due to concern for perioperative NSTEMI. Pharmacy consulted to initiate and manage heparin infusion for suspected ACS. Of note, patient also with new-onset Afib with RVR.  Date Time  HL Rate/Comment 0824 0656 <0.1 Subthera;  650 > 850 un/hr 0824 1651 <0.1 Subthera; 850 > 1050 un/hr (Error with initial lab; rerun is <0.1) 0825 0540 0.1 Subtherapeutic 0825 1500 0.12 Subtherapeutic   Goal of Therapy:  Heparin level 0.3-0.7 units/ml Monitor platelets by anticoagulation protocol: Yes   Plan: Give 1700 units bolus x1; then increase heparin infusion to 1450 units/hr Check anti-Xa level in 8 hours and daily once consecutively therapeutic. Continue to monitor H&H and platelets daily while on heparin gtt.  [place] Pharmacist 07/22/2022 5:57 PM

## 2022-07-22 NOTE — Inpatient Diabetes Management (Signed)
Inpatient Diabetes Program Recommendations  AACE/ADA: New Consensus Statement on Inpatient Glycemic Control (2015)  Target Ranges:  Prepandial:   less than 140 mg/dL      Peak postprandial:   less than 180 mg/dL (1-2 hours)      Critically ill patients:  140 - 180 mg/dL    Latest Reference Range & Units 07/20/22 23:40 07/21/22 07:52 07/21/22 11:28 07/21/22 15:51 07/21/22 19:22  Glucose-Capillary 70 - 99 mg/dL 159 (H) 168 (H)  3 units Novolog  128 (H)  2 units Novolog  114 (H) 123 (H)  2 units Novolog   125 mg Solmedrol '@2050'$   (H): Data is abnormally high  Latest Reference Range & Units 07/21/22 23:17 07/22/22 03:13 07/22/22 07:49  Glucose-Capillary 70 - 99 mg/dL 172 (H)  3 units Novolog  205 (H)  5 units Novolog  232 (H)  (H): Data is abnormally high    Home DM Meds: Amaryl 4 mg Daily       Metformin 500 mg BID  Current Orders: Novolog Moderate Correction Scale/ SSI (0-15 units) Q4 hours    Note pt started on TPN 35cc/hr last night around 6pm  Of note, pt received 125 mg Solumedrol X 1 dose at 9pm  CBGs mildly elevated  Recommend watch CBGs today since Steroids given last PM--If CBGs remain elevated later today may consider adding small amount of Insulin to TPN for tonight    --Will follow patient during hospitalization--  Wyn Quaker RN, MSN, [place] Diabetes Coordinator Inpatient Glycemic Control Team Team Pager: [phone] (8a-5p)

## 2022-07-22 NOTE — Consult Note (Signed)
[facility] for IV Heparin Indication: chest pain/ACS  Patient Measurements: Height: 5' 2.99" (160 cm) Weight: 55.3 kg (122 lb) IBW/kg (Calculated) : 52.38 Heparin Dosing Weight: 55.3 kg  Labs: Recent Labs    07/20/22 2032 07/20/22 2141 07/21/22 0306 07/21/22 0656 07/21/22 0844 07/21/22 1416 07/21/22 1638 07/21/22 1947 07/22/22 0540  HGB 8.3*  --  9.4*  --   --   --   --   --  7.9*  HCT 28.1*  --  30.6*  --   --   --   --   --  25.7*  PLT 305  --  351  --   --   --   --   --  316  APTT  --  44*  --   --   --   --   --   --   --   LABPROT  --  19.5*  --   --   --   --   --   --   --   INR  --  1.7*  --   --   --   --   --   --   --   HEPARINUNFRC  --   --   --  <0.10*  --   --  <0.10*  --  0.10*  CREATININE 1.16*  --  1.01*  --   --   --   --   --  0.61  TROPONINIHS  --   --   --   --    < > 13,331* 11,961* 10,573*  --    < > = values in this interval not displayed.   Estimated Creatinine Clearance: 49.5 mL/min (by C-G formula based on SCr of 0.61 mg/dL). Medical History: Past Medical History:  Diagnosis Date   Arthritis    hands   Cancer ([place])    thyroid- radiation   Diabetes mellitus without complication (HCC)    Hyperlipidemia    Hypertension    Hypothyroidism    IBS (irritable bowel syndrome)    Medications:  No anticoagulation prior to admission per my chart review  Assessment: Patient is a 76 y/o F with medical history as above who presented to the hospital 8/22 with chronic diverticulitis for laparoscopic sigmoidectomy converted to open Hartmann's procedure. POD # 1 patient declined and critical care medicine was consulted for hemodynamic instability with decreased urine output. Cardiology consulted due to concern for perioperative NSTEMI. Pharmacy consulted to initiate and manage heparin infusion for suspected ACS. Of note, patient also with new-onset Afib with RVR.  Date Time  HL Rate/Comment 0824 0656 <0.1 Subthera;  650 > 850 un/hr 0824 1651 <0.1 Subthera; 850 > 1050 un/hr (Error with initial lab; rerun is <0.1) 0825 0540 0.1 Subtherapeutic   Baseline Labs: aPTT - 44s; INR - 1.7 Hgb - 9.4; Plts - 351  Goal of Therapy:  Heparin level 0.3-0.7 units/ml Monitor platelets by anticoagulation protocol: Yes   Plan: --Bolus 1500 units IV heparin then increase infusion rate to 1250 units/hr --heparin level 8 hours after rate change --Daily CBC per protocol while on IV heparin; monitor closely for s/sx of bleeding  Renda Rolls, PharmD, [facility] 07/22/2022 6:21 AM

## 2022-07-22 NOTE — Progress Notes (Signed)
PHARMACY - TOTAL PARENTERAL NUTRITION CONSULT NOTE   Indication: Massive bowel resection  Patient Measurements: Height: 5' 2.99" (160 cm) Weight: 55.3 kg (122 lb) IBW/kg (Calculated) : 52.38 TPN AdjBW (KG): 55.3 Body mass index is 21.62 kg/m.  Assessment: 76yoF with a PMH of hypertension, Graves' disease s/p XRT, type 2 diabetes, IBS, diverticulitis with abscess formation s/p CT-guided drain placement in early June 2023 who presented to [facility] for scheduled laparoscopic sigmoidectomy on 07/19/2022.  Glucose / Insulin: mSSI q4h  BG 123 - 247 previous 24h, 15u SSI required Electrolytes:  Renal: SCr 1.16-->0.61 Hepatic: AST elevated, follow trend MIVF: none GI Imaging: 8/24 Abd xray Normal abdominal gas pattern GI Surgeries / Procedures: laparoscopic sigmoidectomy on 07/19/2022  Central access: 07/21/22 TPN start date: 07/21/22  Nutritional Goals: Goal TPN rate is 70 mL/hr (provides 80 g of protein and 1600 kcals per day)  RD Assessment: Estimated Needs Total Energy Estimated Needs: 1400-1600kcal/day Total Protein Estimated Needs: 70-80g/day Total Fluid Estimated Needs: 1.3-1.5L/day  Current Nutrition:  NPO  Plan:  advance TPN to 74m/hr at 1800 Nutritional components Protein (as 15% Clinisol): 80 grams Dextrose: 14% Lipids (as 20% SMOFilpids): 48 grams kCal 1600 /24h Electrolytes in TPN (standard): Na 529m/L, K 5022mL, Ca 5mE83m, Mg 5mEq65m and Phos 15mmo35m Cl:Ac 1:1 Add standard MVI, thiamine 100 mg (#2/3 days) and trace elements to TPN Insulin: continue Moderate q4h SSI and adjust as needed  2 grams IV magnesium sulfate x 1  10 mEq IV KCl x 1 Monitor TPN labs on Mon/Thurs, daily until stable  RodneyDallie Piles2023,7:18 AM

## 2022-07-22 NOTE — Progress Notes (Signed)
[place] NOTE       Patient ID: Sarah [name] MRN: [id number] DOB/AGE: 1946-08-28 76 y.o.  Admit date: 07/19/2022 Referring Physician Donell Beers, NP  Primary Physician Dr. Sabra Heck Primary Cardiologist Dr. Saralyn Pilar  Reason for Consultation elevated troponin & abnormal EKG, concern for perioperative NSTEMI  HPI: Sarah [name] is a 37yoF with a PMH of hypertension, Graves' disease s/p XRT, type 2 diabetes, IBS, diverticulitis with abscess formation s/p CT-guided drain placement in early June 2023 who presented to [facility] for scheduled laparoscopic sigmoidectomy on 07/19/2022 that was ultimately converted to an open Hartman's procedure, now s/p colostomy. The evening following the procedure she reportedly became hypotensive and tachycardic with SBP in the 90s.  CCM was consulted and the patient was transferred to the ICU early morning of 8/23. Cardiology is consulted the afternoon of 8/23 due to concern for an abnormal EKG and elevated troponin which peaked at 13,300 on 8/24.  Echo shows a newly reduced ejection fraction to 45-50%, consistent with perioperative NSTEMI.  She also developed atrial fibrillation with RVR on 8/24.  Interval History: -Feels much better today, does not feel short of breath.  Continues to deny chest pain.  No palpitations. -Converted back to A-fib with RVR overnight, rate mostly in the 100s to 110s with some paroxysms into the 120s to 130s with increasing oxygen requirement overnight  - started TPN last night, IV fluids stopped this morning.  Has remained off of vasopressors since the morning of 8/24 -Troponin peaked yesterday afternoon at 13,300  Review of systems complete and found to be negative unless listed above     Past Medical History:  Diagnosis Date   Arthritis    hands   Cancer ([place])    thyroid- radiation   Diabetes mellitus without complication ([place])    Hyperlipidemia    Hypertension    Hypothyroidism    IBS (irritable bowel  syndrome)     Past Surgical History:  Procedure Laterality Date   ABDOMINAL HYSTERECTOMY     CATARACT EXTRACTION W/PHACO Left 12/21/2016   Procedure: CATARACT EXTRACTION PHACO AND INTRAOCULAR LENS PLACEMENT ([place])  left eye;  Surgeon: Leandrew Koyanagi, MD;  Location: [facility];  Service: Ophthalmology;  Laterality: Left;  Diabetic - oral meds   CATARACT EXTRACTION W/PHACO Right 01/25/2017   Procedure: CATARACT EXTRACTION PHACO AND INTRAOCULAR LENS PLACEMENT ([place])  right diabetic;  Surgeon: Leandrew Koyanagi, MD;  Location: [facility];  Service: Ophthalmology;  Laterality: Right;  diabetic - oral meds   CHOLECYSTECTOMY     COLONOSCOPY WITH PROPOFOL N/A 10/26/2016   Procedure: COLONOSCOPY WITH PROPOFOL;  Surgeon: Manya Silvas, MD;  Location: [facility] ENDOSCOPY;  Service: Endoscopy;  Laterality: N/A;   IR RADIOLOGIST EVAL & MGMT  06/14/2022   KNEE SURGERY Right     Medications Prior to Admission  Medication Sig Dispense Refill Last Dose   cyanocobalamin 1000 MCG tablet Take 1,000 mcg by mouth daily.   07/18/2022   glimepiride (AMARYL) 4 MG tablet Take 4 mg by mouth daily with breakfast.   07/18/2022   levothyroxine (SYNTHROID) 150 MCG tablet Take 150 mcg by mouth daily before breakfast.   07/19/2022 at 0730   metFORMIN (GLUCOPHAGE) 500 MG tablet Take 500 mg by mouth 2 (two) times daily with a meal.      metoprolol succinate (TOPROL-XL) 50 MG 24 hr tablet Take 50 mg by mouth daily. Take with or immediately following a meal.   07/19/2022 at 0730   Olmesartan-Amlodipine-HCTZ  40-10-25 MG TABS Take 1 tablet by mouth 3 (three) times a week. Mon, Wed, Fri   07/19/2022 at 0730   ondansetron (ZOFRAN-ODT) 4 MG disintegrating tablet Take 1 tablet (4 mg total) by mouth every 8 (eight) hours as needed for nausea or vomiting. 12 tablet 0 07/19/2022 at 0200   potassium chloride (K-DUR) 10 MEQ tablet Take 10 mEq by mouth daily.   07/18/2022   sertraline (ZOLOFT) 100 MG tablet Take 100 mg by  mouth daily.      simvastatin (ZOCOR) 20 MG tablet Take 20 mg by mouth daily.   Past Week   venlafaxine (EFFEXOR) 37.5 MG tablet Take 37.5 mg by mouth daily.   07/19/2022 at 0730   sodium chloride flush (NS) 0.9 % SOLN 5 mLs by Intracatheter route every 8 (eight) hours. 1350 mL 0    Social History   Socioeconomic History   Marital status: Married    Spouse name: Not on file   Number of children: Not on file   Years of education: Not on file   Highest education level: Not on file  Occupational History   Not on file  Tobacco Use   Smoking status: Never   Smokeless tobacco: Never   Tobacco comments:    social as teenager  Vaping Use   Vaping Use: Never used  Substance and Sexual Activity   Alcohol use: No   Drug use: No   Sexual activity: Not on file  Other Topics Concern   Not on file  Social History Narrative   Not on file   Social Determinants of Health   Financial Resource Strain: Not on file  Food Insecurity: Not on file  Transportation Needs: Not on file  Physical Activity: Not on file  Stress: Not on file  Social Connections: Not on file  Intimate Partner Violence: Not on file    Family History  Problem Relation Age of Onset   Breast cancer Neg Hx       PHYSICAL EXAM General: Elderly and frail-appearing Caucasian female, in no acute distress, sitting in incline in ICU bed. HEENT:  Normocephalic and atraumatic. Neck:  No JVD.  Lungs: Normal respiratory effort on HFNC clear bilaterally to auscultation with decreased breath sounds in bases. Heart: Tachycardic irregularly irregular rate and rhythm.  Normal S1 and S2 without gallops or murmurs.  Abdomen: Non-distended appearing.  Vertical midline incision present with honeycomb dressing.  Colostomy present LLQ with good output Msk: Normal strength and tone for age. Extremities: Warm and well perfused. No clubbing, cyanosis.  Grossly edematous upper extremities, SCDs in place to bilateral lower legs without  significant pedal edema.   Neuro: Alert and oriented X 3. Psych:  Answers questions appropriately.   Labs: Basic Metabolic Panel: Recent Labs    07/20/22 1808 07/20/22 2032 07/21/22 0306 07/22/22 0540  NA  --    < > 140 137  K 3.2*   < > 3.7 3.5  CL  --    < > 113* 110  CO2  --    < > 21* 22  GLUCOSE  --    < > 156* 247*  BUN  --    < > 23 24*  CREATININE  --    < > 1.01* 0.61  CALCIUM  --    < > 7.4* 7.8*  MG  --    < > 2.6* 1.8  PHOS 3.4  --   --  2.3*   < > = values in this  interval not displayed.    Liver Function Tests: Recent Labs    07/22/22 0540  AST 47*  ALT 23  ALKPHOS 47  BILITOT 0.3  PROT 5.1*  ALBUMIN 1.8*   No results for input(s): "LIPASE", "AMYLASE" in the last 72 hours. CBC: Recent Labs    07/21/22 0306 07/22/22 0540  WBC 11.8* 10.4  HGB 9.4* 7.9*  HCT 30.6* 25.7*  MCV 89.7 88.9  PLT 351 316    Cardiac Enzymes: Recent Labs    07/21/22 1416 07/21/22 1638 07/21/22 1947  TROPONINIHS 13,331* 11,961* 10,573*    BNP: Invalid input(s): "POCBNP" D-Dimer: No results for input(s): "DDIMER" in the last 72 hours. Hemoglobin A1C: No results for input(s): "HGBA1C" in the last 72 hours. Fasting Lipid Panel: Recent Labs    07/22/22 0540  TRIG 168*   Thyroid Function Tests: No results for input(s): "TSH", "T4TOTAL", "T3FREE", "THYROIDAB" in the last 72 hours.  Invalid input(s): "FREET3" Anemia Panel: No results for input(s): "VITAMINB12", "FOLATE", "FERRITIN", "TIBC", "IRON", "RETICCTPCT" in the last 72 hours.  [place] EKG SITE RITE  Result Date: 07/21/2022 If [facility] image not attached, placement could not be confirmed due to current cardiac rhythm.  DG Abd 1 View  Result Date: 07/21/2022 CLINICAL DATA:  [id number].  Abdominal pain EXAM: ABDOMEN - 1 VIEW COMPARISON:  06/21/2015 FINDINGS: Multiple surgical skin staples overlie the lower abdomen. Normal abdominal gas pattern. No gross free intraperitoneal gas. Cholecystectomy clips are seen  in the right upper quadrant. Surgical drain overlies the mid pelvis. No organomegaly. Osseous structures are age-appropriate. IMPRESSION: Normal abdominal gas pattern. Electronically Signed   By: Fidela Salisbury M.D.   On: 07/21/2022 00:36   DG Chest 1 View  Result Date: 07/21/2022 CLINICAL DATA:  Abdominal pain.  [id number] EXAM: CHEST  1 VIEW COMPARISON:  02/23/2021 FINDINGS: Lungs volumes are small, but are symmetric and are clear. No pneumothorax or pleural effusion. Cardiac size within normal limits. Pulmonary vascularity is normal. Osseous structures are age-appropriate. No acute bone abnormality. IMPRESSION: No active disease. Electronically Signed   By: Fidela Salisbury M.D.   On: 07/21/2022 00:35   ECHOCARDIOGRAM COMPLETE  Result Date: 07/20/2022    ECHOCARDIOGRAM REPORT   Patient Name:   MICAL BRUN Date of Exam: 07/20/2022 Medical Rec #:  [id number]   Height:       63.0 in Accession #:    [id number]  Weight:       122.0 lb Date of Birth:  [birth year]   BSA:          1.567 m Patient Age:    34 years    BP:           105/73 mmHg Patient Gender: F           HR:           126 bpm. Exam Location:  [facility] Procedure: 2D Echo, Color Doppler and Cardiac Doppler Indications:     I21.4 NSTEMI  History:         Patient has no prior history of Echocardiogram examinations.                  Arrythmias:Tachycardia; Risk Factors:Hypertension, Diabetes and                  Dyslipidemia.  Sonographer:     Charmayne Sheer Referring Phys:  [id number] [place] Yanessa Hocevar Diagnosing Phys: Yolonda Kida MD  Sonographer Comments: No subcostal window. IMPRESSIONS  1. Left ventricular ejection  fraction, by estimation, is 45 to 50%. The left ventricle has mildly decreased function. The left ventricle demonstrates global hypokinesis. The left ventricular internal cavity size was mildly to moderately dilated. Left ventricular diastolic parameters were normal.  2. Right ventricular systolic function is normal. The right ventricular size is  normal.  3. The mitral valve is normal in structure. No evidence of mitral valve regurgitation.  4. The aortic valve is normal in structure. Aortic valve regurgitation is not visualized. FINDINGS  Left Ventricle: Left ventricular ejection fraction, by estimation, is 45 to 50%. The left ventricle has mildly decreased function. The left ventricle demonstrates global hypokinesis. The left ventricular internal cavity size was mildly to moderately dilated. There is borderline left ventricular hypertrophy. Left ventricular diastolic parameters were normal. Right Ventricle: The right ventricular size is normal. No increase in right ventricular wall thickness. Right ventricular systolic function is normal. Left Atrium: Left atrial size was normal in size. Right Atrium: Right atrial size was normal in size. Pericardium: There is no evidence of pericardial effusion. Mitral Valve: The mitral valve is normal in structure. No evidence of mitral valve regurgitation. Tricuspid Valve: The tricuspid valve is normal in structure. Tricuspid valve regurgitation is trivial. Aortic Valve: The aortic valve is normal in structure. Aortic valve regurgitation is not visualized. Aortic valve mean gradient measures 5.0 mmHg. Aortic valve peak gradient measures 9.0 mmHg. Aortic valve area, by VTI measures 2.19 cm. Pulmonic Valve: The pulmonic valve was normal in structure. Pulmonic valve regurgitation is not visualized. Aorta: The ascending aorta was not well visualized. IAS/Shunts: No atrial level shunt detected by color flow Doppler.  LEFT VENTRICLE PLAX 2D LVIDd:         5.24 cm   Diastology LVIDs:         3.95 cm   LV e' medial:    4.79 cm/s LV PW:         1.53 cm   LV E/e' medial:  17.3 LV IVS:        0.93 cm   LV e' lateral:   7.51 cm/s LVOT diam:     2.00 cm   LV E/e' lateral: 11.1 LV SV:         51 LV SV Index:   32 LVOT Area:     3.14 cm  RIGHT VENTRICLE RV Basal diam:  2.79 cm RV S prime:     26.90 cm/s LEFT ATRIUM           Index         RIGHT ATRIUM           Index LA diam:      3.80 cm 2.42 cm/m   RA Area:     15.10 cm LA Vol (A2C): 33.8 ml 21.57 ml/m  RA Volume:   38.80 ml  24.76 ml/m  AORTIC VALVE AV Area (Vmax):    1.94 cm AV Area (Vmean):   1.77 cm AV Area (VTI):     2.19 cm AV Vmax:           150.00 cm/s AV Vmean:          110.000 cm/s AV VTI:            0.232 m AV Peak Grad:      9.0 mmHg AV Mean Grad:      5.0 mmHg LVOT Vmax:         92.70 cm/s LVOT Vmean:        61.900 cm/s LVOT VTI:  0.162 m LVOT/AV VTI ratio: 0.70  AORTA Ao Root diam: 3.10 cm MITRAL VALVE MV Area (PHT): 10.25 cm   SHUNTS MV Decel Time: 74 msec     Systemic VTI:  0.16 m MV E velocity: 83.05 cm/s  Systemic Diam: 2.00 cm MV A velocity: 78.65 cm/s MV E/A ratio:  1.06 Yolonda Kida MD Electronically signed by Yolonda Kida MD Signature Date/Time: 07/20/2022/5:44:47 PM    Final      Radiology: [place] EKG SITE RITE  Result Date: 07/21/2022 If Site Rite image not attached, placement could not be confirmed due to current cardiac rhythm.  DG Abd 1 View  Result Date: 07/21/2022 CLINICAL DATA:  [id number].  Abdominal pain EXAM: ABDOMEN - 1 VIEW COMPARISON:  06/21/2015 FINDINGS: Multiple surgical skin staples overlie the lower abdomen. Normal abdominal gas pattern. No gross free intraperitoneal gas. Cholecystectomy clips are seen in the right upper quadrant. Surgical drain overlies the mid pelvis. No organomegaly. Osseous structures are age-appropriate. IMPRESSION: Normal abdominal gas pattern. Electronically Signed   By: Fidela Salisbury M.D.   On: 07/21/2022 00:36   DG Chest 1 View  Result Date: 07/21/2022 CLINICAL DATA:  Abdominal pain.  [id number] EXAM: CHEST  1 VIEW COMPARISON:  02/23/2021 FINDINGS: Lungs volumes are small, but are symmetric and are clear. No pneumothorax or pleural effusion. Cardiac size within normal limits. Pulmonary vascularity is normal. Osseous structures are age-appropriate. No acute bone abnormality. IMPRESSION: No active  disease. Electronically Signed   By: Fidela Salisbury M.D.   On: 07/21/2022 00:35   ECHOCARDIOGRAM COMPLETE  Result Date: 07/20/2022    ECHOCARDIOGRAM REPORT   Patient Name:   Sarah [name] Date of Exam: 07/20/2022 Medical Rec #:  [id number]   Height:       63.0 in Accession #:    [id number]  Weight:       122.0 lb Date of Birth:  Apr 02, 1946   BSA:          1.567 m Patient Age:    59 years    BP:           105/73 mmHg Patient Gender: F           HR:           126 bpm. Exam Location:  [facility] Procedure: 2D Echo, Color Doppler and Cardiac Doppler Indications:     I21.4 NSTEMI  History:         Patient has no prior history of Echocardiogram examinations.                  Arrythmias:Tachycardia; Risk Factors:Hypertension, Diabetes and                  Dyslipidemia.  Sonographer:     Charmayne Sheer Referring Phys:  [id number] [place] Eddye Broxterman Diagnosing Phys: Yolonda Kida MD  Sonographer Comments: No subcostal window. IMPRESSIONS  1. Left ventricular ejection fraction, by estimation, is 45 to 50%. The left ventricle has mildly decreased function. The left ventricle demonstrates global hypokinesis. The left ventricular internal cavity size was mildly to moderately dilated. Left ventricular diastolic parameters were normal.  2. Right ventricular systolic function is normal. The right ventricular size is normal.  3. The mitral valve is normal in structure. No evidence of mitral valve regurgitation.  4. The aortic valve is normal in structure. Aortic valve regurgitation is not visualized. FINDINGS  Left Ventricle: Left ventricular ejection fraction, by estimation, is 45 to 50%. The left ventricle has  mildly decreased function. The left ventricle demonstrates global hypokinesis. The left ventricular internal cavity size was mildly to moderately dilated. There is borderline left ventricular hypertrophy. Left ventricular diastolic parameters were normal. Right Ventricle: The right ventricular size is normal. No increase in right  ventricular wall thickness. Right ventricular systolic function is normal. Left Atrium: Left atrial size was normal in size. Right Atrium: Right atrial size was normal in size. Pericardium: There is no evidence of pericardial effusion. Mitral Valve: The mitral valve is normal in structure. No evidence of mitral valve regurgitation. Tricuspid Valve: The tricuspid valve is normal in structure. Tricuspid valve regurgitation is trivial. Aortic Valve: The aortic valve is normal in structure. Aortic valve regurgitation is not visualized. Aortic valve mean gradient measures 5.0 mmHg. Aortic valve peak gradient measures 9.0 mmHg. Aortic valve area, by VTI measures 2.19 cm. Pulmonic Valve: The pulmonic valve was normal in structure. Pulmonic valve regurgitation is not visualized. Aorta: The ascending aorta was not well visualized. IAS/Shunts: No atrial level shunt detected by color flow Doppler.  LEFT VENTRICLE PLAX 2D LVIDd:         5.24 cm   Diastology LVIDs:         3.95 cm   LV e' medial:    4.79 cm/s LV PW:         1.53 cm   LV E/e' medial:  17.3 LV IVS:        0.93 cm   LV e' lateral:   7.51 cm/s LVOT diam:     2.00 cm   LV E/e' lateral: 11.1 LV SV:         51 LV SV Index:   32 LVOT Area:     3.14 cm  RIGHT VENTRICLE RV Basal diam:  2.79 cm RV S prime:     26.90 cm/s LEFT ATRIUM           Index        RIGHT ATRIUM           Index LA diam:      3.80 cm 2.42 cm/m   RA Area:     15.10 cm LA Vol (A2C): 33.8 ml 21.57 ml/m  RA Volume:   38.80 ml  24.76 ml/m  AORTIC VALVE AV Area (Vmax):    1.94 cm AV Area (Vmean):   1.77 cm AV Area (VTI):     2.19 cm AV Vmax:           150.00 cm/s AV Vmean:          110.000 cm/s AV VTI:            0.232 m AV Peak Grad:      9.0 mmHg AV Mean Grad:      5.0 mmHg LVOT Vmax:         92.70 cm/s LVOT Vmean:        61.900 cm/s LVOT VTI:          0.162 m LVOT/AV VTI ratio: 0.70  AORTA Ao Root diam: 3.10 cm MITRAL VALVE MV Area (PHT): 10.25 cm   SHUNTS MV Decel Time: 74 msec     Systemic  VTI:  0.16 m MV E velocity: 83.05 cm/s  Systemic Diam: 2.00 cm MV A velocity: 78.65 cm/s MV E/A ratio:  1.06 Yolonda Kida MD Electronically signed by Yolonda Kida MD Signature Date/Time: 07/20/2022/5:44:47 PM    Final     ECHO (stress echo) 02/14/2017  Ref Range & Units 5  yr ago  LV Ejection Fraction (%)  55   Aortic Valve Regurgitation Grade  none   Aortic Valve Stenosis Grade  none   Mitral Valve Regurgitation Grade  trivial   Mitral Valve Stenosis Grade  none   Tricuspid Valve Regurgitation Grade  trivial   Tricuspid Valve Regurgitation Max Velocity (m/s) m/sec 1.4   Right Ventricle Systolic Pressure (mmHg) mmHg 12.4   Narrative                     CARDIOLOGY DEPARTMENT                   [place], [place]                      [id number]                    [facility]                 Acct #: [id number]          [address] [place], [place] [address]       Date: 02/14/2017 02:57 PM                                                             Adult Female Age: 77 yrs                     ECHOCARDIOGRAM REPORT                   Outpatient                                                             KC::KCWC            STUDY:Stress Echo        TAPE:                   MD1:  DRANE, ANNA MARIA             ECHO:Yes   DOPPLER:Yes  FILE:0000-000-000       BP: 148/83 mmHg            COLOR:Yes  CONTRAST:No      MACHINE:[place]      Height: 63 in        RV BIOPSY:No         3D:No   SOUND QLTY:Moderate     Weight: 174 lb           MEDIUM:None                                       BSA: 1.8 m2   ___________________________________________________________________________________________     HISTORY:Chest pain      REASON:Assess, LV function  Indication:R07.89 Other chest pain, I10 Essential (  primary) hypertension, R06.02             Shortness of breath  ___________________________________________________________________________________________    STRESS ECHOCARDIOGRAPHY            Protocol:Treadmill ([place])              Drugs:None  Target Heart Rate:128 bpm        Maximum Predicted Heart Rate: 150 bpm   +-------------------+-------------------------+-------------------------+------------+         Stage            Duration (mm:ss)         Heart Rate (bpm)         BP       +-------------------+-------------------------+-------------------------+------------+        RESTING                                           85              148/83     +-------------------+-------------------------+-------------------------+------------+        EXERCISE                4:49                     210                /        +-------------------+-------------------------+-------------------------+------------+        RECOVERY                8:05                      94              176/88     +-------------------+-------------------------+-------------------------+------------+     Stress Duration:4:49 mm:ss    Max Stress H.R.:210 bpm        Target Heart Rate Achieved: Yes   ___________________________________________________________________________________________   WALL SEGMENT CHANGES                         Rest          Stress  Anterior Septum Basal:Normal        Hyperkinetic                    UVO:ZDGUYQ        Hyperkinetic                 Apical:Normal        Hyperkinetic     Anterior Wall Basal:Normal        Hyperkinetic                    IHK:VQQVZD        Hyperkinetic                 Apical:Normal        Hyperkinetic      Lateral Wall Basal:Normal        Hyperkinetic                    GLO:VFIEPP        Hyperkinetic                 Apical:Normal        Hyperkinetic  Posterior Wall Basal:Normal        Hyperkinetic                    PYP:PJKDTO        Hyperkinetic     Inferior Wall Basal:Normal        Hyperkinetic                    IZT:IWPYKD        Hyperkinetic                 Apical:Normal         Hyperkinetic   Inferior Septum Basal:Normal        Hyperkinetic                    XIP:JASNKN        Hyperkinetic              Resting EF:>55% (Est.)   Stress EF: >55% (Est.)   ___________________________________________________________________________________________   ADDITIONAL FINDINGS    ___________________________________________________________________________________________   STRESS ECG RESULTS                                                ECG Results:Normal   ___________________________________________________________________________________________  ECHOCARDIOGRAPHIC DESCRIPTIONS   LEFT VENTRICLE         Size:Normal  Contraction:Normal    LV Masses:No Masses          LZJ:QBHA   RIGHT VENTRICLE         Size:Normal                Free Wall:Normal  Contraction:Normal                RV Masses:No mass   PERICARDIUM        Fluid:No effusion    _______________________________________________________________________________________  DOPPLER ECHO and OTHER SPECIAL PROCEDURES     Aortic:No AR                      No AS      Mitral:TRIVIAL MR                 No MS            MV Inflow E Vel=nm*        MV Annulus E'Vel=nm*            E/E'Ratio=nm*   Tricuspid:TRIVIAL TR                 No TS            136.0 cm/sec peak TR vel   12.4 mmHg peak RV pressure   Pulmonary:No PR                      No PS     ___________________________________________________________________________________________  ECHOCARDIOGRAPHIC MEASUREMENTS  2D DIMENSIONS  AORTA             Values      Normal Range      MAIN PA          Values      Normal Range            Annulus:  nm*       [2.1 - 2.5]  PA Main:  nm*       [1.5 - 2.1]          Aorta Sin:  nm*       [2.7 - 3.3]       RIGHT VENTRICLE        ST Junction:  nm*       [2.3 - 2.9]                RV Base:  nm*       [ < 4.2]          Asc.Aorta:  nm*       [2.3 - 3.1]                 RV Mid:  nm*       [ < 3.5]   LEFT  VENTRICLE                                        RV Length:  nm*       [ < 8.6]              LVIDd:  nm*       [3.9 - 5.3]       INFERIOR VENA CAVA              LVIDs:  nm*                                 Max. IVC:  nm*       [ <= 2.1]                 FS:  nm*       [> 25]                    Min. IVC:  nm*                SWT:  nm*       [0.5 - 0.9]                   ------------------                PWT:  nm*       [0.5 - 0.9]                   nm* - not measured   LEFT ATRIUM            LA Diam:  nm*       [2.7 - 3.8]        LA A4C Area:  nm*       [ < 20]          LA Volume:  nm*       [22 - 52]    ___________________________________________________________________________________________  INTERPRETATION  Normal Stress Echocardiogram  NORMAL RIGHT VENTRICULAR SYSTOLIC FUNCTION  TRIVIAL REGURGITATION NOTED (See above)  NO VALVULAR STENOSIS NOTED    ___________________________________________________________________________________________  Electronically signed by: MD Miquel Dunn on 02/15/2017 10:07 AM              Performed By: Johnathan Hausen, RDCS, RVT        Ordering Physician: Clabe Seal   TELEMETRY reviewed by me (LT) 07/22/2022 : Back into A-fib with RVR with rates mostly in the  100s to 1 teens with paroxysms into the 120s to 130s  EKG reviewed by me and Dr. Saralyn Pilar: 8/23 sinus tachycardia with rate 118 nonspecific T wave changes, repeat at 12:00 without significant changes. 1700 on 8/23, AF RVR with nonspecific ST changes but no stemi per Dr. Saralyn Pilar  Data reviewed by me (LT) 07/22/2022: Reviewed troponins, chest x-ray, CBC, BNP, BMP thyroid panel  ASSESSMENT AND PLAN:  Sarah [name] is a 42yoF with a PMH of hypertension, Graves' disease s/p XRT, type 2 diabetes, IBS, diverticulitis with abscess formation s/p CT-guided drain placement in early June 2023 who presented to [facility] for scheduled laparoscopic sigmoidectomy on 07/19/2022 that was ultimately converted to an open  Hartman's procedure, now s/p colostomy. The evening following the procedure she reportedly became hypotensive and tachycardic with SBP in the 90s.  CCM was consulted and the patient was transferred to the ICU early morning of 8/23. Cardiology is consulted the afternoon of 8/23 due to concern for an abnormal EKG and elevated troponin which peaked at 13,300 on 8/24.  Echo shows a newly reduced ejection fraction to 45-50%, consistent with perioperative NSTEMI.  She also developed atrial fibrillation with RVR on 8/24.  #Sepsis 2/2 suspected intra-abdominal infection vs UTI #Diverticulitis s/p robotic sigmoidectomy converted to open Hartman's procedure on 07/19/22 Agree with current therapy per primary team & surgery  #perioperative NSTEMI Underwent scheduled robotic sigmoidectomy for diverticulitis on 8/22 with Dr. Lysle Pearl, the surgery ultimately was converted to an open Hartman's procedure and she is now s/p colostomy.  Overnight on the floor she became hypotensive and tachycardic, and was transferred to the ICU for closer monitoring.  Ultimately she did not require vasopressor support and blood pressures been stable throughout the day.  A troponin was checked which was significantly elevated at 7000, and repeat at 9500.  EKGs read as acute MI by the computer, confirmed with Dr. Clayborn Bigness not to be a STEMI, but with nonspecific T wave changes and poor R wave progression, overall not significantly changed from prior in July 2023. -Concern for perioperative NSTEMI with troponin peak at 13,300 and newly reduced ejection fraction & LV global hypokinesis -S/p 325 mg aspirin, continue 81 mg aspirin daily  -Per surgery, heparin GTT okay, continue this while inpatient for ACS and [facility] with her new onset A-fib as below.  -Close monitoring of H&H: decreased from 9.4-7.9 overnight -S/p Neo-Synephrine overnight, off since morning of 8/24 -Restart metoprolol tartrate 12.5 mg twice daily for rate control -Hold home  antihypertensives irbesartan 150 mg, amlodipine 10 mg and HCTZ 12.'5mg'$  -Continue atorvastatin 40 mg once daily -Monitor and replete electrolytes for a magnesium >2, potassium >4 -Continuous monitoring on telemetry while inpatient -We will consider further ischemic work-up pending clinical course, but continue to favor conservative management from a cardiac standpoint, per Dr. 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  Chest x-ray with more atelectasis than overt edema.  BNP elevated at 1600, appears generally edematous in her upper extremities on exam. -Give IV Lasix 20 mg x 2 doses, anticipate need for further diuresis  #New onset paroxysmal atrial fibrillation with RVR #hypothyroidism (History of Graves' disease s/p XRT) Developed the evening of 8/23, heart rates in the 160s, s/p amiodarone bolus x2 and remains on continuous infusion.  -Was in sinus rhythm on 8/24, converted back to A-fib with RVR on 8/25 -S/p amiodarone gtt since the evening of 8/23 -  will stop drip and start p.o. amiodarone 200 mg twice daily x 4 days, then 200 mg daily thereafter starting 8/29. -Continue heparin GTT while inpatient, will likely transition to [place] at discharge.  CHA2DS2-VASc 6 (age, sex, chf, htn, MI) -thyroid panel: Slightly low TSH at 0.368, T4 within normal limits 4.9, T3 uptake slightly elevated at 43%, hold levothyroxine for now.  This patient's plan of care was discussed and created with Dr. Saralyn Pilar and he is in agreement.  Signed: Tristan Schroeder , PA-C 07/22/2022, 8:26 AM [facility]

## 2022-07-22 NOTE — Progress Notes (Signed)
NAME:  Sarah [name], MRN:  [id number], DOB:  November 20, 1946, LOS: 3 ADMISSION DATE:  07/19/2022, CONSULTATION DATE:  07/20/22 REFERRING MD:  Benjamine Sprague, DO  REASON FOR CONSULT: Hypotension    HPI  Sarah [name] is a 31yoF with a PMH of hypertension, Graves' disease s/p XRT, type 2 diabetes, IBS, diverticulitis with abscess formation s/p CT-guided drain placement in early June 2023 who presented to [facility] for scheduled laparoscopic sigmoidectomy on 07/19/2022.  Hospital Course: Patient underwent robotic assisted laparoscopic sigmoidectomy, converted to open hartman's procedure. Patient was transferred to medical floor post -op. Overnight patient noted to have decreased urine output, hypotensive and tachycardic.  Due to persistent hypotension despite IV fluid resuscitation, patient was started on peripheral Levophed and transferred to the ICU.  PCCM consulted to assist with management. On arrival to the ICU, vital signs showed blood pressure of 98/60 mm Hg, tachycardia of 115 beats per minute, a respiratory rate of 22 breaths per minute, and a temperature of 98.66F (36.7.C).  Patient remained hemodynamically stable not requiring pressors.   Repeat Pertinent Labs Findings: Chemistry:Na+/ K+: 140/2.8 (142/3.3) Glucose:184  BUN/Cr.:23/1.14 (24/1.16) CBC: WBC:5.4 Hgb/Hct:9.6/30.8:  Other Lab findings:   PCT: 1.39 Lactic acid: 3.4 COVID PCR: Negative, Troponin:  6.999>9.494>8.837>11,207 EKG:  sinus tachycardia with rate 118 with premature atrial contractions and premature ventricular contractions, nonspecific ST changes with poor R wave progression.  Pt developed new onset atrial fibrillation with rvr at 1740 heart rate 130-180's.  Amiodarone bolus followed by continuous amiodarone gtt administered.  Pts troponin's trending back up to 11,207 unable to anticoagulate pt s/p robotic sigmoidectomy converted to open Hartman's procedure. Cardiology was consulted. Initially holding heparin per surgery however, patient  noted with worsening symptoms of nausea, Afib RVR with HR's in the 170's despite being on Amiodarone. She was more lethargic drifting in and out of somnolence. Decision made to restart Heparin after further discussion with General surgery due to high risk for decompensation and intubation. PCCM re-consulted.  Past Medical History  Hypertension, Graves' disease s/p XRT, type 2 diabetes, IBS, diverticulitis with abscess formation s/p CT-guided drain placement in early June 2023  [facility] Events   8/22:S/p robotic sigmoidectomy converted to open Hartman's procedure for complicated diverticulitis. 8/23: Transferred to ICU for hypotension. PCCM consulted for pressor.  Developed Afib RVR with elevated troponin concerns for NSTEMI. Cardiology consulted.  Pt received amiodarone bolus followed by amiodarone gtt.  Overnight required additional amiodarone bolus due to recurrent atrial fibrillation with rvr hr 170's and pt symptomatic.  8/24: Pt remains on amiodarone gtt 30 mg/hr and heparin gtt converted to nsr hr 101.  Requiring neo-synephrine gtt a 10 mcg/min to maintain map >65 8/25: Weaned off pressors.  Concern for developing pulmonary edema, stop IVF and diurese. Remains in A-fib (HF mostly 100-110's) requiring Amiodarone gtt.  Consults:  PCCM Cardiology General Surgery  Procedures:  8/22: Robotic assisted laparoscopic sigmoidectomy, converted to open hartman's procedure   Significant Diagnostic Tests:  8/24: Chest Xray>No acute abnormality 8/24: Abdominal xray>No acute abnormality  Micro Data:  8/24: MRSA PCR>>negative 8/24:Urine>>  Antimicrobials:  Vancomycin 8/24>8/24 Ceftriaxone 8/22> Metronidazole 8/22 >  OBJECTIVE  Blood pressure 119/76, pulse 71, temperature 98 F (36.7 C), temperature source Oral, resp. rate 15, height 5' 2.99" (1.6 m), weight 55.3 kg, SpO2 97 %.   FiO2 (%):  [96 %] 96 %   Intake/Output Summary (Last 24 hours) at 07/22/2022 0750 Last data filed at  07/22/2022 0723 Gross per 24 hour  Intake  3713.68 ml  Output 1785 ml  Net 1928.68 ml    Filed Weights   07/19/22 1033  Weight: 55.3 kg   Physical Examination  GENERAL: Acutely-ill appearing female, NAD resting in bed on 4L O2 via nasal canula  HEENT: Head atraumatic, normocephalic. Oropharynx and nasopharynx clear.  Supple, no JVD  LUNGS: Clear throughout with bibasilar crackles, even, mild tachypnea but no overt distress CARDIOVASCULAR: Tachycardia, irregularly irregular rhythm, no r/g, 2+ radial/2+ distal pulses, no edema  ABDOMEN: Hypoactive BS x4, soft, tender, LLQ ileostomy pink/patent draining brown stool  EXTREMITIES: No pedal edema, cyanosis, or clubbing.  NEUROLOGIC:Alert and oriented, follows commands, PERRLA SKIN: Midline abdominal incision dressing intact with dried drainage; RLQ JP drain   Labs/imaging that I havepersonally reviewed  (right click and "Reselect all SmartList Selections" daily)     Labs   CBC: Recent Labs  Lab 07/20/22 0327 07/20/22 1155 07/20/22 2032 07/21/22 0306 07/22/22 0540  WBC 5.4  --  11.3* 11.8* 10.4  HGB 9.6* 9.4* 8.3* 9.4* 7.9*  HCT 30.8* 30.6* 28.1* 30.6* 25.7*  MCV 88.8  --  91.5 89.7 88.9  PLT 256  --  305 351 316     Basic Metabolic Panel: Recent Labs  Lab 07/20/22 0327 07/20/22 1344 07/20/22 1742 07/20/22 1808 07/20/22 2032 07/21/22 0306 07/22/22 0540  NA 140  --  142  --  142 140 137  K 2.8* 3.0* 3.2* 3.2* 3.3* 3.7 3.5  CL 112*  --  112*  --  112* 113* 110  CO2 21*  --  19*  --  22 21* 22  GLUCOSE 184*  --  149*  --  163* 156* 247*  BUN 15  --  23  --  24* 23 24*  CREATININE 0.81  --  1.14*  --  1.16* 1.01* 0.61  CALCIUM 7.3*  --  7.4*  --  7.3* 7.4* 7.8*  MG 1.5* 2.1  --   --  2.0 2.6* 1.8  PHOS  --   --   --  3.4  --   --  2.3*    GFR: Estimated Creatinine Clearance: 49.5 mL/min (by C-G formula based on SCr of 0.61 mg/dL). Recent Labs  Lab 07/20/22 0327 07/20/22 2032 07/20/22 2141 07/20/22 2324  07/21/22 0306 07/22/22 0540  PROCALCITON  --   --  1.39  --  1.37 0.54  WBC 5.4 11.3*  --   --  11.8* 10.4  LATICACIDVEN  --  3.4*  --  2.3*  --   --      Liver Function Tests: Recent Labs  Lab 07/22/22 0540  AST 47*  ALT 23  ALKPHOS 47  BILITOT 0.3  PROT 5.1*  ALBUMIN 1.8*   No results for input(s): "LIPASE", "AMYLASE" in the last 168 hours. No results for input(s): "AMMONIA" in the last 168 hours.  ABG    Component Value Date/Time   PHART 7.37 07/21/2022 0357   PCO2ART 33 07/21/2022 0357   PO2ART 75 (L) 07/21/2022 0357   HCO3 19.1 (L) 07/21/2022 0357   ACIDBASEDEF 5.3 (H) 07/21/2022 0357   O2SAT 96.4 07/21/2022 0357     Coagulation Profile: Recent Labs  Lab 07/20/22 2141  INR 1.7*     Cardiac Enzymes: No results for input(s): "CKTOTAL", "CKMB", "CKMBINDEX", "TROPONINI" in the last 168 hours.  HbA1C: Hgb A1c MFr Bld  Date/Time Value Ref Range Status  05/30/2022 03:14 AM 8.3 (H) 4.8 - 5.6 % Final    Comment:    (  NOTE)         Prediabetes: 5.7 - 6.4         Diabetes: >6.4         Glycemic control for adults with diabetes: <7.0     CBG: Recent Labs  Lab 07/21/22 1128 07/21/22 1551 07/21/22 1922 07/21/22 2317 07/22/22 0313  GLUCAP 128* 114* 123* 172* 205*     Review of Systems:   Positives in BOLD: Gen: Denies fever, chills, weight change, fatigue, night sweats HEENT: Denies blurred vision, double vision, hearing loss, tinnitus, sinus congestion, rhinorrhea, sore throat, neck stiffness, dysphagia PULM: Denies shortness of breath, cough, sputum production, hemoptysis, wheezing CV: Denies chest pain, edema, orthopnea, paroxysmal nocturnal dyspnea, palpitations GI: Denies abdominal pain, nausea, vomiting, diarrhea, hematochezia, melena, constipation, change in bowel habits GU: Denies dysuria, hematuria, polyuria, oliguria, urethral discharge Endocrine: Denies hot or cold intolerance, polyuria, polyphagia or appetite change Derm: Denies rash, dry  skin, scaling or peeling skin change Heme: Denies easy bruising, bleeding, bleeding gums Neuro: Denies headache, numbness, weakness, slurred speech, loss of memory or consciousness  Past Medical History  She,  has a past medical history of Arthritis, Cancer ([place]), Diabetes mellitus without complication ([place]), Hyperlipidemia, Hypertension, Hypothyroidism, and IBS (irritable bowel syndrome).   Surgical History    Past Surgical History:  Procedure Laterality Date   ABDOMINAL HYSTERECTOMY     CATARACT EXTRACTION W/PHACO Left 12/21/2016   Procedure: CATARACT EXTRACTION PHACO AND INTRAOCULAR LENS PLACEMENT ([place])  left eye;  Surgeon: Leandrew Koyanagi, MD;  Location: [place];  Service: Ophthalmology;  Laterality: Left;  Diabetic - oral meds   CATARACT EXTRACTION W/PHACO Right 01/25/2017   Procedure: CATARACT EXTRACTION PHACO AND INTRAOCULAR LENS PLACEMENT ([place])  right diabetic;  Surgeon: Leandrew Koyanagi, MD;  Location: [place];  Service: Ophthalmology;  Laterality: Right;  diabetic - oral meds   CHOLECYSTECTOMY     COLONOSCOPY WITH PROPOFOL N/A 10/26/2016   Procedure: COLONOSCOPY WITH PROPOFOL;  Surgeon: Manya Silvas, MD;  Location: [facility] ENDOSCOPY;  Service: Endoscopy;  Laterality: N/A;   IR RADIOLOGIST EVAL & MGMT  06/14/2022   KNEE SURGERY Right      Social History   reports that she has never smoked. She has never used smokeless tobacco. She reports that she does not drink alcohol and does not use drugs.   Family History   Her family history is negative for Breast cancer.   Allergies Allergies  Allergen Reactions   Penicillins Anaphylaxis    TOLERATED CEFOTETAN 06/2022   Sulfa Antibiotics Rash     Home Medications  Prior to Admission medications   Medication Sig Start Date End Date Taking? Authorizing Provider  cyanocobalamin 1000 MCG tablet Take 1,000 mcg by mouth daily.   Yes [provider]  glimepiride (AMARYL) 4 MG tablet Take 4 mg  by mouth daily with breakfast. 09/29/21 09/29/22 Yes [provider]  levothyroxine (SYNTHROID) 150 MCG tablet Take 150 mcg by mouth daily before breakfast.   Yes [provider]  metFORMIN (GLUCOPHAGE) 500 MG tablet Take 500 mg by mouth 2 (two) times daily with a meal.   Yes [provider]  metoprolol succinate (TOPROL-XL) 50 MG 24 hr tablet Take 50 mg by mouth daily. Take with or immediately following a meal.   Yes [provider]  Olmesartan-Amlodipine-HCTZ 40-10-25 MG TABS Take 1 tablet by mouth 3 (three) times a week. Mon, Wed, Fri   Yes [provider]  ondansetron (ZOFRAN-ODT) 4 MG disintegrating tablet Take  1 tablet (4 mg total) by mouth every 8 (eight) hours as needed for nausea or vomiting. 06/05/22  Yes Arta Silence, MD  potassium chloride (K-DUR) 10 MEQ tablet Take 10 mEq by mouth daily.   Yes [provider]  sertraline (ZOLOFT) 100 MG tablet Take 100 mg by mouth daily.   Yes [provider]  simvastatin (ZOCOR) 20 MG tablet Take 20 mg by mouth daily.   Yes [provider]  venlafaxine (EFFEXOR) 37.5 MG tablet Take 37.5 mg by mouth daily.   Yes [provider]  sodium chloride flush (NS) 0.9 % SOLN 5 mLs by Intracatheter route every 8 (eight) hours. 05/30/22 08/28/22  Benjamine Sprague, DO    Scheduled Meds:  aspirin  81 mg Oral Daily   atorvastatin  40 mg Oral QHS   Chlorhexidine Gluconate Cloth  6 each Topical Daily   gabapentin  300 mg Oral BID   insulin aspart  0-15 Units Subcutaneous Q4H   ipratropium-albuterol  3 mL Nebulization Q6H   levothyroxine  150 mcg Oral Q0600   pantoprazole (PROTONIX) IV  40 mg Intravenous QHS   sodium chloride flush  10-40 mL Intracatheter Q12H   venlafaxine  37.5 mg Oral Q breakfast   Continuous Infusions:  amiodarone 30 mg/hr (07/22/22 0600)   cefTRIAXone (ROCEPHIN)  IV Stopped (07/21/22 1256)   heparin 1,250 Units/hr (07/22/22 0722)   lactated ringers 90 mL/hr at  07/22/22 0600   magnesium sulfate bolus IVPB     metronidazole 100 mL/hr at 07/22/22 0600   phenylephrine (NEO-SYNEPHRINE) Adult infusion Stopped (07/21/22 1051)   potassium chloride     TPN ADULT (ION) 35 mL/hr at 07/22/22 0600   PRN Meds:.HYDROmorphone (DILAUDID) injection, ipratropium-albuterol, ondansetron **OR** ondansetron (ZOFRAN) IV, sodium chloride flush   Active Hospital Problem list     Assessment & Plan:   Sepsis secondary to suspected intraabdominal Infection & UTI Diverticulitis  s/p robotic sigmoidectomy converted to open Hartman's procedure on 07/19/22 with possible intraabdominal infection  -Monitor fever curve -Trend WBC's & Procalcitonin -Follow cultures as above -Continue empiric Ceftriaxone & Flagyl pending cultures & sensitivities -PRN antiemetic and pain management -Management per general surgery  NSTEMI  New onset atrial fibrillation with rvr  Shock: Septic +/- Cardiogenic ~ RESOLVED  Echo 08/23: EF 45 to 50%, left ventricle demonstrates global hypokinesis  -Continuous cardiac monitoring -Maintain MAP >65 -Stop IV fluids -Vasopressors as needed to maintain MAP goal ~ weaned off since 8/24 -Trend lactic acid until normalized (3.4 ~ 2.3 ~ ) -HS Troponin peaked at 13,331 -BNP is 1664 -Diuresis as BP and renal function permits ~ start 20 mg IV Lasix BID -Continue amiodarone and heparin gtts  -Continue aspirin and atorvastatin  -Cardiology following appreciate input: considering further ischemic work-up pending clinical course, but favoring conservative management   Acute Hypoxic Respiratory Failure in the setting of suspected developing pulmonary edema and atelectasis -Supplemental O2 as needed to maintain O2 sats >92% -Follow intermittent Chest X-ray & ABG as needed -Bronchodilators  -Diuresis as BP and renal function permits ~ start IV Lasix 20 mg BID -Aggressive Pulmonary toilet as able  Normocytic Normochromic Anemia without s/sx of overt blood  loss -Monitor for S/Sx of bleeding -Trend CBC -Heparin drip for Anticoagulation/VTE Prophylaxis  -Transfuse for Hgb <7  Mild AKI in the setting of ATN ~ RESOLVED Lactic Acidosis (3.4 ~2.3 ~) -Monitor I&O's / urinary output -Follow BMP -Ensure adequate renal perfusion -Avoid nephrotoxic agents as able -Replace electrolytes as indicated -Pharmacy following for  assistance with electrolyte replacement    Type II Diabetes mellitus - CBG's q4hrs - SSI - Hemoglobin A1C pending   Hypothyroidism - Continue Synthroid -TSH slightly low at 0.36, but T4 normal at 4.9    Best practice:  Diet:  NPO; continue TPN  Pain/Anxiety/Delirium protocol (if indicated): Yes  VAP protocol (if indicated): Not indicated DVT prophylaxis: Systemic AC GI prophylaxis: PPI Glucose control:  SSI Yes Central venous access: PICC line, and is still needed for TPN Arterial line:  N/A Foley:  Yes, and it is still needed Mobility:  bed rest  PT consulted: N/A Last date of multidisciplinary goals of care discussion [07/22/22] Code Status:  full code Disposition: ICU    Critical care time: 40 minutes      Darel Hong, AGACNP-BC [facility] [place] epic messenger for cross cover needs If after hours, please call E-link

## 2022-07-22 NOTE — Progress Notes (Signed)
Subjective:  CC: Sarah [name] is a 76 y.o. female  Hospital stay day 3, 3 Days Post-Op robotic sigmoidectomy converted to open Hartman's procedure  HPI: Overall feeling tired, asking for some food.  Pain is controlled  ROS:  General: Denies weight loss, weight gain, fatigue, fevers, chills, and night sweats. Heart: Denies chest pain, palpitations, racing heart, irregular heartbeat, leg pain or swelling, and decreased activity tolerance. Respiratory: Denies breathing difficulty, shortness of breath, wheezing, cough, and sputum. GI: Denies change in appetite, heartburn, nausea, vomiting, constipation, diarrhea, and blood in stool. GU: Denies difficulty urinating, pain with urinating, urgency, frequency, blood in urine.   Objective:   Temp:  [97.9 F (36.6 C)-98.3 F (36.8 C)] 98 F (36.7 C) (08/25 0700) Pulse Rate:  [70-122] 72 (08/25 0900) Resp:  [15-28] 19 (08/25 0900) BP: (104-130)/(55-83) 120/63 (08/25 0900) SpO2:  [87 %-99 %] 94 % (08/25 0900) FiO2 (%):  [96 %] 96 % (08/25 0245)     Height: 5' 2.99" (160 cm) Weight: 55.3 kg BMI (Calculated): 21.62   Intake/Output this shift:   Intake/Output Summary (Last 24 hours) at 07/22/2022 1011 Last data filed at 07/22/2022 0900 Gross per 24 hour  Intake 3592.91 ml  Output 1835 ml  Net 1757.91 ml    Constitutional :  alert, cooperative, appears stated age, and no distress  Respiratory:  clear to auscultation bilaterally  Cardiovascular:  Tachycardic  Gastrointestinal: Soft, no guarding, generalized abdominal TTP decreasing as expected based on extent of surery . Ostomy pink, patent, stool in bag  Skin: Cool and moist. Incisions c/d/i  Psychiatric: Normal affect, non-agitated, not confused       LABS:     Latest Ref Rng & Units 07/22/2022    5:40 AM 07/21/2022    3:06 AM 07/20/2022    8:32 PM  CMP  Glucose 70 - 99 mg/dL 247  156  163   BUN 8 - 23 mg/dL '24  23  24   '$ Creatinine 0.44 - 1.00 mg/dL 0.61  1.01  1.16   Sodium 135 -  145 mmol/L 137  140  142   Potassium 3.5 - 5.1 mmol/L 3.5  3.7  3.3   Chloride 98 - 111 mmol/L 110  113  112   CO2 22 - 32 mmol/L '22  21  22   '$ Calcium 8.9 - 10.3 mg/dL 7.8  7.4  7.3   Total Protein 6.5 - 8.1 g/dL 5.1     Total Bilirubin 0.3 - 1.2 mg/dL 0.3     Alkaline Phos 38 - 126 U/L 47     AST 15 - 41 U/L 47     ALT 0 - 44 U/L 23         Latest Ref Rng & Units 07/22/2022    5:40 AM 07/21/2022    3:06 AM 07/20/2022    8:32 PM  CBC  WBC 4.0 - 10.5 K/uL 10.4  11.8  11.3   Hemoglobin 12.0 - 15.0 g/dL 7.9  9.4  8.3   Hematocrit 36.0 - 46.0 % 25.7  30.6  28.1   Platelets 150 - 400 K/uL 316  351  305     RADS: N/a Assessment:   S/p robotic sigmoidectomy converted to open Hartman's procedure for complicated diverticulitis.  GI exam improving with consistent output from ostomy since post op.  Ok to start clears from surgery standpoint.  Recommend continuing TPN in the meantime until able to advance diet further due to baseline malnutrition  Appreciate cards and ICU support in the meantime for likely post op sepsis, NSTEMI.   labs/images/medications/previous chart entries reviewed personally and relevant changes/updates noted above.

## 2022-07-22 NOTE — Progress Notes (Signed)
Nutrition Follow Up Note   DOCUMENTATION CODES:   Severe malnutrition in context of acute illness/injury  INTERVENTION:   Continue TPN per pharmacy- (provides 1600kcal/day and 80g/day protein)  Pt at high refeed risk; recommend monitor potassium, magnesium and phosphorus labs daily until stable  Daily weights  Boost Breeze po TID, each supplement provides 250 kcal and 9 grams of protein  NUTRITION DIAGNOSIS:   Severe Malnutrition related to acute illness as evidenced by 21 percent weight loss in 3 months, severe fat depletion, severe muscle depletion.  GOAL:   Patient will meet greater than or equal to 90% of their needs -met with TPN  MONITOR:   PO intake, Supplement acceptance, Labs, Weight trends, Skin, I & O's, TPN  ASSESSMENT:   76 y/o female with a PMH of hypertension, Graves' disease s/p XRT, hypothyroidism, type 2 DM, IBS, HTN, HLD and diverticulitis with abscess formation s/p IR drain 7/18, now s/p Hartmann's procedure 7/98 complicated by sepsis, AKI, NSTEMI and new Afib.  Pt tolerating TPN at half rate; plan is for goal rate tonight. Pt is refeeding; electrolytes being replaced. Pt with hyperglycemia; recommend addition of insulin in TPN. Pt is asking for food; clear liquids initiated today. Pt reports intermittent nausea with certain smells. RD will add Boost Breeze. No new weight since 8/22; pt is ordered for daily weights. Pt +7.0L on her I & Os. Pt is receiving lasix today. Chest xray with minimal pleural effusions.  Medications reviewed and include: aspirin, lasix, insulin, synthroid, protonix, ceftriaxone, heparin, metronidazole, KCl  Labs reviewed: K 3.5 wnl, BUN 24(H), P 2.3(L), Mg 1.8 wnl BNP- 1664 Hgb 7.9(L), Hct 25.7(L) Cbgs-  204, 232, 205 x 24hrs  Drain- 63m  Diet Order:   Diet Order             Diet clear liquid Room service appropriate? Yes; Fluid consistency: Thin  Diet effective now                  EDUCATION NEEDS:   Education  needs have been addressed  Skin:  Skin Assessment: Reviewed RN Assessment (incision abdomen)  Last BM:  8/25- 1049mvia ostomy  Height:   Ht Readings from Last 1 Encounters:  07/20/22 5' 2.99" (1.6 m)    Weight:   Wt Readings from Last 1 Encounters:  07/19/22 55.3 kg    Ideal Body Weight:  52.3 kg  BMI:  Body mass index is 21.62 kg/m.  Estimated Nutritional Needs:   Kcal:  1400-1600kcal/day  Protein:  70-80g/day  Fluid:  1.3-1.5L/day  CaKoleen DistanceS, RD, LDN Please refer to [facility] and/or RD on-call/weekend/after hours pager

## 2022-07-22 NOTE — TOC Initial Note (Signed)
Transition of Care (TOC) - Initial/Assessment Note    Patient Details  Name: Sarah [name] MRN: [id number] Date of Birth: 02/02/1946  Transition of Care (TOC) CM/SW Contact:    Jeanna M Creech, RN Phone Number: 07/22/2022, 4:33 PM  Clinical Narrative:                 Patient post op from robotic sigmoidectomy converted to open hartmans procedure.  Patient is currently in the ICU, on acute oxygen at HFNC 6L.  Patient is on TPN, she was started on a clear diet.   RNCM met with patient at the bedside, she is from home with her husband who is currently at the hospice home for respite.  Patient's person of contact would be her brother Johnny.  Her husband has been on hospice since the end of May, she has been taking care of him at home.   Patient will likely need SNF at discharge.  TOC will cont to follow patient progression.    Expected Discharge Plan: Skilled Nursing Facility Barriers to Discharge: Continued Medical Work up   Patient Goals and CMS Choice Patient states their goals for this hospitalization and ongoing recovery are:: wants to get better CMS Medicare.gov Compare Post Acute Care list provided to:: Patient Choice offered to / list presented to : Patient  Expected Discharge Plan and Services Expected Discharge Plan: Skilled Nursing Facility   Discharge Planning Services: CM Consult Post Acute Care Choice: Skilled Nursing Facility Living arrangements for the past 2 months: Single Family Home                 DME Arranged: N/A                    Prior Living Arrangements/Services Living arrangements for the past 2 months: Single Family Home Lives with:: Spouse Patient language and need for interpreter reviewed:: Yes Do you feel safe going back to the place where you live?: Yes      Need for Family Participation in Patient Care: Yes (Comment) Care giver support system in place?: Yes (comment)   Criminal Activity/Legal Involvement Pertinent to Current  Situation/Hospitalization: No - Comment as needed  Activities of Daily Living      Permission Sought/Granted Permission sought to share information with : Case Manager, Facility Contact Representative, Family Supports Permission granted to share information with : Yes, Verbal Permission Granted  Share Information with NAME: Johnny Mctavish  Permission granted to share info w AGENCY: SNF's  Permission granted to share info w Relationship: Brother  Permission granted to share info w Contact Information: [phone]  Emotional Assessment Appearance:: Appears stated age Attitude/Demeanor/Rapport: Engaged Affect (typically observed): Accepting Orientation: : Oriented to Self, Oriented to Place, Oriented to  Time, Oriented to Situation Alcohol / Substance Use: Not Applicable Psych Involvement: No (comment)  Admission diagnosis:  Diverticulitis [K57.92] Patient Active Problem List   Diagnosis Date Noted   Diverticulitis 07/19/2022   Diverticulitis large intestine 05/28/2022   PCP:  Miller, Mark F, MD Pharmacy:   [id number] - [place] (N), [place] - [address] [place] (N) [place] [address] Phone: [phone] Fax: [phone]     Social Determinants of Health (SDOH) Interventions    Readmission Risk Interventions     No data to display           

## 2022-07-23 ENCOUNTER — Inpatient Hospital Stay: Payer: Medicare Other

## 2022-07-23 DIAGNOSIS — I214 Non-ST elevation (NSTEMI) myocardial infarction: Secondary | ICD-10-CM | POA: Diagnosis present

## 2022-07-23 DIAGNOSIS — J9601 Acute respiratory failure with hypoxia: Secondary | ICD-10-CM | POA: Diagnosis not present

## 2022-07-23 DIAGNOSIS — E1165 Type 2 diabetes mellitus with hyperglycemia: Secondary | ICD-10-CM | POA: Diagnosis present

## 2022-07-23 DIAGNOSIS — R570 Cardiogenic shock: Secondary | ICD-10-CM | POA: Diagnosis not present

## 2022-07-23 DIAGNOSIS — E876 Hypokalemia: Secondary | ICD-10-CM | POA: Insufficient documentation

## 2022-07-23 DIAGNOSIS — K5792 Diverticulitis of intestine, part unspecified, without perforation or abscess without bleeding: Secondary | ICD-10-CM | POA: Diagnosis not present

## 2022-07-23 DIAGNOSIS — I5021 Acute systolic (congestive) heart failure: Secondary | ICD-10-CM | POA: Diagnosis not present

## 2022-07-23 DIAGNOSIS — L8990 Pressure ulcer of unspecified site, unspecified stage: Secondary | ICD-10-CM | POA: Diagnosis present

## 2022-07-23 DIAGNOSIS — A419 Sepsis, unspecified organism: Secondary | ICD-10-CM | POA: Diagnosis not present

## 2022-07-23 DIAGNOSIS — E43 Unspecified severe protein-calorie malnutrition: Secondary | ICD-10-CM | POA: Diagnosis present

## 2022-07-23 DIAGNOSIS — E878 Other disorders of electrolyte and fluid balance, not elsewhere classified: Secondary | ICD-10-CM | POA: Diagnosis not present

## 2022-07-23 DIAGNOSIS — E871 Hypo-osmolality and hyponatremia: Secondary | ICD-10-CM | POA: Insufficient documentation

## 2022-07-23 DIAGNOSIS — I4891 Unspecified atrial fibrillation: Secondary | ICD-10-CM | POA: Diagnosis not present

## 2022-07-23 LAB — IRON AND TIBC
Iron: 33 ug/dL (ref 28–170)
Saturation Ratios: 25 % (ref 10.4–31.8)
TIBC: 134 ug/dL — ABNORMAL LOW (ref 250–450)
UIBC: 101 ug/dL

## 2022-07-23 LAB — BASIC METABOLIC PANEL
Anion gap: 3 — ABNORMAL LOW (ref 5–15)
BUN: 25 mg/dL — ABNORMAL HIGH (ref 8–23)
CO2: 23 mmol/L (ref 22–32)
Calcium: 7.3 mg/dL — ABNORMAL LOW (ref 8.9–10.3)
Chloride: 108 mmol/L (ref 98–111)
Creatinine, Ser: 0.5 mg/dL (ref 0.44–1.00)
GFR, Estimated: >60 mL/min (ref >60)
Glucose, Bld: 195 mg/dL — ABNORMAL HIGH (ref 70–99)
Potassium: 3.5 mmol/L (ref 3.5–5.1)
Sodium: 134 mmol/L — ABNORMAL LOW (ref 135–145)

## 2022-07-23 LAB — CBC
HCT: 24.6 % — ABNORMAL LOW (ref 36.0–46.0)
Hemoglobin: 7.7 g/dL — ABNORMAL LOW (ref 12.0–15.0)
MCH: 27.1 pg (ref 26.0–34.0)
MCHC: 31.3 g/dL (ref 30.0–36.0)
MCV: 86.6 fL (ref 80.0–100.0)
Platelets: 325 10*3/uL (ref 150–400)
RBC: 2.84 MIL/uL — ABNORMAL LOW (ref 3.87–5.11)
RDW: 17.2 % — ABNORMAL HIGH (ref 11.5–15.5)
WBC: 12 10*3/uL — ABNORMAL HIGH (ref 4.0–10.5)
nRBC: 0 % (ref 0.0–0.2)

## 2022-07-23 LAB — GLUCOSE, CAPILLARY
Glucose-Capillary: 117 mg/dL — ABNORMAL HIGH (ref 70–99)
Glucose-Capillary: 155 mg/dL — ABNORMAL HIGH (ref 70–99)
Glucose-Capillary: 199 mg/dL — ABNORMAL HIGH (ref 70–99)
Glucose-Capillary: 252 mg/dL — ABNORMAL HIGH (ref 70–99)
Glucose-Capillary: 308 mg/dL — ABNORMAL HIGH (ref 70–99)
Glucose-Capillary: 329 mg/dL — ABNORMAL HIGH (ref 70–99)

## 2022-07-23 LAB — URINE CULTURE: Culture: 40000 — AB

## 2022-07-23 LAB — HEPARIN LEVEL (UNFRACTIONATED)
Heparin Unfractionated: 0.17 [IU]/mL — ABNORMAL LOW (ref 0.30–0.70)
Heparin Unfractionated: 0.35 [IU]/mL (ref 0.30–0.70)
Heparin Unfractionated: 0.21 IU/mL — ABNORMAL LOW (ref 0.30–0.70)

## 2022-07-23 LAB — FERRITIN: Ferritin: 191 ng/mL (ref 11–307)

## 2022-07-23 LAB — VITAMIN B12: Vitamin B-12: 1446 pg/mL — ABNORMAL HIGH (ref 180–914)

## 2022-07-23 LAB — PHOSPHORUS
Phosphorus: 1.6 mg/dL — ABNORMAL LOW (ref 2.5–4.6)
Phosphorus: 2.6 mg/dL (ref 2.5–4.6)

## 2022-07-23 LAB — MAGNESIUM: Magnesium: 1.8 mg/dL (ref 1.7–2.4)

## 2022-07-23 MED ORDER — METOPROLOL TARTRATE 25 MG PO TABS
25.0000 mg | ORAL_TABLET | Freq: Two times a day (BID) | ORAL | Status: DC
  Administered 2022-07-23 – 2022-07-30 (×14): 25 mg via ORAL
  Filled 2022-07-23 (×16): qty 1

## 2022-07-23 MED ORDER — METOPROLOL TARTRATE 5 MG/5ML IV SOLN
INTRAVENOUS | Status: AC
  Filled 2022-07-23: qty 5

## 2022-07-23 MED ORDER — INSULIN REGULAR(HUMAN) IN NACL 100-0.9 UT/100ML-% IV SOLN
INTRAVENOUS | Status: DC
  Filled 2022-07-23: qty 100

## 2022-07-23 MED ORDER — TRACE MINERALS CU-MN-SE-ZN 300-55-60-3000 MCG/ML IV SOLN
INTRAVENOUS | Status: AC
  Filled 2022-07-23: qty 266.56

## 2022-07-23 MED ORDER — INSULIN ASPART 100 UNIT/ML IJ SOLN
5.0000 [IU] | Freq: Three times a day (TID) | INTRAMUSCULAR | Status: DC
  Administered 2022-07-26 – 2022-08-01 (×17): 5 [IU] via SUBCUTANEOUS
  Filled 2022-07-23 (×14): qty 1

## 2022-07-23 MED ORDER — FUROSEMIDE 10 MG/ML IJ SOLN
40.0000 mg | Freq: Once | INTRAMUSCULAR | Status: AC
  Administered 2022-07-23: 40 mg via INTRAVENOUS
  Filled 2022-07-23: qty 4

## 2022-07-23 MED ORDER — HEPARIN BOLUS VIA INFUSION
850.0000 [IU] | Freq: Once | INTRAVENOUS | Status: AC
  Administered 2022-07-23: 850 [IU] via INTRAVENOUS
  Filled 2022-07-23: qty 850

## 2022-07-23 MED ORDER — POTASSIUM PHOSPHATES 15 MMOLE/5ML IV SOLN
45.0000 mmol | Freq: Once | INTRAVENOUS | Status: AC
  Administered 2022-07-23: 45 mmol via INTRAVENOUS
  Filled 2022-07-23: qty 15

## 2022-07-23 MED ORDER — HEPARIN BOLUS VIA INFUSION
1700.0000 [IU] | Freq: Once | INTRAVENOUS | Status: AC
  Administered 2022-07-23: 1700 [IU] via INTRAVENOUS
  Filled 2022-07-23: qty 1700

## 2022-07-23 MED ORDER — INSULIN ASPART 100 UNIT/ML IJ SOLN
0.0000 [IU] | INTRAMUSCULAR | Status: DC
  Administered 2022-07-24: 4 [IU] via SUBCUTANEOUS
  Administered 2022-07-24: 3 [IU] via SUBCUTANEOUS
  Administered 2022-07-24: 4 [IU] via SUBCUTANEOUS
  Administered 2022-07-24 – 2022-07-25 (×4): 3 [IU] via SUBCUTANEOUS
  Administered 2022-07-25: 2 [IU] via SUBCUTANEOUS
  Administered 2022-07-25: 7 [IU] via SUBCUTANEOUS
  Administered 2022-07-25 (×2): 3 [IU] via SUBCUTANEOUS
  Administered 2022-07-26 (×3): 4 [IU] via SUBCUTANEOUS
  Administered 2022-07-26 – 2022-07-27 (×2): 7 [IU] via SUBCUTANEOUS
  Administered 2022-07-27 (×3): 4 [IU] via SUBCUTANEOUS
  Administered 2022-07-27: 3 [IU] via SUBCUTANEOUS
  Administered 2022-07-28 (×2): 7 [IU] via SUBCUTANEOUS
  Administered 2022-07-28: 3 [IU] via SUBCUTANEOUS
  Administered 2022-07-28: 7 [IU] via SUBCUTANEOUS
  Administered 2022-07-28: 3 [IU] via SUBCUTANEOUS
  Administered 2022-07-28 – 2022-07-29 (×3): 7 [IU] via SUBCUTANEOUS
  Administered 2022-07-29: 4 [IU] via SUBCUTANEOUS
  Administered 2022-07-29: 3 [IU] via SUBCUTANEOUS
  Administered 2022-07-30: 4 [IU] via SUBCUTANEOUS
  Administered 2022-07-30: 15 [IU] via SUBCUTANEOUS
  Administered 2022-07-30 (×3): 4 [IU] via SUBCUTANEOUS
  Administered 2022-07-30: 20 [IU] via SUBCUTANEOUS
  Administered 2022-07-30 – 2022-07-31 (×2): 7 [IU] via SUBCUTANEOUS
  Administered 2022-07-31: 4 [IU] via SUBCUTANEOUS
  Administered 2022-07-31: 3 [IU] via SUBCUTANEOUS
  Administered 2022-07-31: 4 [IU] via SUBCUTANEOUS
  Administered 2022-07-31: 11 [IU] via SUBCUTANEOUS
  Administered 2022-08-01: 7 [IU] via SUBCUTANEOUS
  Administered 2022-08-01: 11 [IU] via SUBCUTANEOUS
  Administered 2022-08-01: 7 [IU] via SUBCUTANEOUS
  Administered 2022-08-01: 11 [IU] via SUBCUTANEOUS
  Filled 2022-07-23 (×45): qty 1

## 2022-07-23 MED ORDER — IPRATROPIUM BROMIDE 0.02 % IN SOLN
0.5000 mg | Freq: Four times a day (QID) | RESPIRATORY_TRACT | Status: DC
  Administered 2022-07-23 – 2022-07-28 (×21): 0.5 mg via RESPIRATORY_TRACT
  Filled 2022-07-23 (×21): qty 2.5

## 2022-07-23 MED ORDER — METOPROLOL TARTRATE 5 MG/5ML IV SOLN
5.0000 mg | Freq: Once | INTRAVENOUS | Status: AC
  Administered 2022-07-23: 5 mg via INTRAVENOUS
  Filled 2022-07-23: qty 5

## 2022-07-23 MED ORDER — METOPROLOL TARTRATE 50 MG PO TABS: 50.0000 mg | ORAL_TABLET | Freq: Two times a day (BID) | ORAL | Status: DC

## 2022-07-23 MED ORDER — METOPROLOL TARTRATE 5 MG/5ML IV SOLN
5.0000 mg | Freq: Once | INTRAVENOUS | Status: AC
Start: 2022-07-23 — End: 2022-07-24
  Administered 2022-07-24: 5 mg via INTRAVENOUS
  Filled 2022-07-23: qty 5

## 2022-07-23 MED ORDER — DEXTROSE 50 % IV SOLN: 0.0000 mL | INTRAVENOUS | Status: DC | PRN

## 2022-07-23 MED ORDER — TRACE MINERALS CU-MN-SE-ZN 300-55-60-3000 MCG/ML IV SOLN
INTRAVENOUS | Status: DC
  Filled 2022-07-23: qty 533.12

## 2022-07-23 MED ORDER — LEVALBUTEROL HCL 0.63 MG/3ML IN NEBU
0.6300 mg | INHALATION_SOLUTION | Freq: Four times a day (QID) | RESPIRATORY_TRACT | Status: DC
Start: 2022-07-23 — End: 2022-07-28
  Administered 2022-07-23 – 2022-07-28 (×21): 0.63 mg via RESPIRATORY_TRACT
  Filled 2022-07-23 (×21): qty 3

## 2022-07-23 MED ORDER — FUROSEMIDE 10 MG/ML IJ SOLN
40.0000 mg | Freq: Three times a day (TID) | INTRAMUSCULAR | Status: DC
  Administered 2022-07-23 – 2022-07-24 (×3): 40 mg via INTRAVENOUS
  Filled 2022-07-23 (×3): qty 4

## 2022-07-23 MED ORDER — MAGNESIUM SULFATE 2 GM/50ML IV SOLN
2.0000 g | Freq: Once | INTRAVENOUS | Status: AC
Start: 2022-07-23 — End: 2022-07-23
  Administered 2022-07-23: 2 g via INTRAVENOUS
  Filled 2022-07-23: qty 50

## 2022-07-23 NOTE — Progress Notes (Addendum)
Patient is heart rate is in the 130-140's. Patinet is complaining of shortness or breath but appears to not be in distress. %mg of iv metoprolol administered. Pulse ox is at 90% on 9L HFNC. Duoneb will be administered via Respiratory therapy. Heart rate is responding to medication.   Heart rate is controlled. Patient has been placed on Bipap.

## 2022-07-23 NOTE — Consult Note (Signed)
ANTICOAGULATION CONSULT NOTE  Pharmacy Consult for IV Heparin Indication: chest pain/ACS  Patient Measurements: Height: 5' 2.99" (160 cm) Weight: 70.3 kg (154 lb 15.7 oz) IBW/kg (Calculated) : 52.38 Heparin Dosing Weight: 55.3 kg  Labs: Recent Labs    07/20/22 2141 07/21/22 0306 07/21/22 0656 07/21/22 1416 07/21/22 1638 07/21/22 1947 07/22/22 0540 07/22/22 1500 07/23/22 0244 07/23/22 1328  HGB  --  9.4*  --   --   --   --  7.9* 7.9* 7.7*  --   HCT  --  30.6*  --   --   --   --  25.7* 25.3* 24.6*  --   PLT  --  351  --   --   --   --  316  --  325  --   APTT 44*  --   --   --   --   --   --   --   --   --   LABPROT 19.5*  --   --   --   --   --   --   --   --   --   INR 1.7*  --   --   --   --   --   --   --   --   --   HEPARINUNFRC  --   --    < >  --  <0.10*  --  0.10* 0.12* 0.17* 0.35  CREATININE  --  1.01*  --   --   --   --  0.61  --  0.50  --   TROPONINIHS  --   --    < > 13,331* 11,961* 10,573*  --   --   --   --    < > = values in this interval not displayed.   Estimated Creatinine Clearance: 56.3 mL/min (by C-G formula based on SCr of 0.5 mg/dL). Medical History: Past Medical History:  Diagnosis Date   Arthritis    hands   Cancer ([place])    thyroid- radiation   Diabetes mellitus without complication (HCC)    Hyperlipidemia    Hypertension    Hypothyroidism    IBS (irritable bowel syndrome)    Medications:  No anticoagulation prior to admission per my chart review  Assessment: Patient is a 76 y/o F with medical history as above who presented to the hospital 8/22 with chronic diverticulitis for laparoscopic sigmoidectomy converted to open Hartmann's procedure. POD # 1 patient declined and critical care medicine was consulted for hemodynamic instability with decreased urine output. Cardiology consulted due to concern for perioperative NSTEMI. Pharmacy consulted to initiate and manage heparin infusion for suspected ACS. Of note, patient also with new-onset Afib  with RVR.  Date Time  HL Rate/Comment 0824 0656 <0.1 Subthera; 650 > 850 un/hr 0824 1651 <0.1 Subthera; 850 > 1050 un/hr (Error with initial lab; rerun is <0.1) 0825 0540 0.1 Subtherapeutic 0825 1500 0.12 Subtherapeutic 0826 0244 0.17 Subtherapeutic  0826 1328 0.35 Subtherapeutic    Goal of Therapy:  Heparin level 0.3-0.7 units/ml Monitor platelets by anticoagulation protocol: Yes   Plan: Heparin level is therapeutic. Will continue heparin infusion at 1600 unit/hr. Recheck heparin level in 8 hours. CBC daily while on heparin.   Eleonore Chiquito, PharmD, 07/23/2022 1:55 PM

## 2022-07-23 NOTE — Progress Notes (Deleted)
[facility] Cardiology    SUBJECTIVE: Patient complained of generalized weakness fatigue denies any pain no nausea vomiting or diarrhea   Vitals:   07/23/22 0321 07/23/22 0350 07/23/22 0400 07/23/22 0500  BP:   118/71 115/65  Pulse:   100 100  Resp:   (!) 31 (!) 26  Temp:      TempSrc:      SpO2: 94% 93% 95% 94%  Weight:    70.3 kg  Height:         Intake/Output Summary (Last 24 hours) at 07/23/2022 0606 Last data filed at 07/23/2022 8299 Gross per 24 hour  Intake 1919.26 ml  Output 1360 ml  Net 559.26 ml      PHYSICAL EXAM  General: Well developed, well nourished, in no acute distress HEENT:  Normocephalic and atramatic Neck:  No JVD.  Lungs: Clear bilaterally to auscultation and percussion. Heart: Irregular irregular. Normal S1 and S2 without gallops or murmurs.  Abdomen: Bowel sounds are positive, abdomen soft and non-tender  Msk:  Back normal, normal gait. Normal strength and tone for age. Extremities: No clubbing, cyanosis or edema.   Neuro: Alert and oriented X 3. Psych:  Good affect, responds appropriately   LABS: Basic Metabolic Panel: Recent Labs    07/22/22 0540 07/23/22 0244  NA 137 134*  K 3.5 3.5  CL 110 108  CO2 22 23  GLUCOSE 247* 195*  BUN 24* 25*  CREATININE 0.61 0.50  CALCIUM 7.8* 7.3*  MG 1.8 1.8  PHOS 2.3* 1.6*   Liver Function Tests: Recent Labs    07/22/22 0540  AST 47*  ALT 23  ALKPHOS 47  BILITOT 0.3  PROT 5.1*  ALBUMIN 1.8*   No results for input(s): "LIPASE", "AMYLASE" in the last 72 hours. CBC: Recent Labs    07/22/22 0540 07/22/22 1500 07/23/22 0244  WBC 10.4  --  12.0*  HGB 7.9* 7.9* 7.7*  HCT 25.7* 25.3* 24.6*  MCV 88.9  --  86.6  PLT 316  --  325   Cardiac Enzymes: No results for input(s): "CKTOTAL", "CKMB", "CKMBINDEX", "TROPONINI" in the last 72 hours. BNP: Invalid input(s): "POCBNP" D-Dimer: No results for input(s): "DDIMER" in the last 72 hours. Hemoglobin A1C: No results for input(s): "HGBA1C" in the  last 72 hours. Fasting Lipid Panel: Recent Labs    07/22/22 0540  TRIG 168*   Thyroid Function Tests: Recent Labs    07/21/22 1120  TSH 0.368*  T4TOTAL 4.9   Anemia Panel: No results for input(s): "VITAMINB12", "FOLATE", "FERRITIN", "TIBC", "IRON", "RETICCTPCT" in the last 72 hours.  DG Chest Port 1 View  Result Date: 07/22/2022 CLINICAL DATA:  Status post sigmoid colectomy. EXAM: PORTABLE CHEST 1 VIEW COMPARISON:  July 21, 2022. FINDINGS: Stable cardiomegaly. Interval placement of right-sided PICC line with distal tip in expected position of cavoatrial junction. Mild bibasilar subsegmental atelectasis is noted. Bony thorax is unremarkable. IMPRESSION: Interval placement of right-sided PICC line. Mild bibasilar subsegmental atelectasis is noted with probable minimal pleural effusions. Electronically Signed   By: Marijo Conception M.D.   On: 07/22/2022 08:47   [place] EKG SITE RITE  Result Date: 07/21/2022 If Site Rite image not attached, placement could not be confirmed due to current cardiac rhythm.    Echo borderline hypo-  TELEMETRY: Atrial fibrillation rate of 90 nonspecific ST-T wave changes:  ASSESSMENT AND PLAN:  Principal Problem:   Diverticulitis Active Problems:   Protein-calorie malnutrition, severe   Pressure injury of skin    Plan  Continue broad-spectrum antibiotic therapy Agree with parenteral nutrition TPN Moderately controlled with necessary Paroxysmal A-fib we will see when is safe to restart heparin Continue medical therapy   Yolonda Kida, MD 07/23/2022 6:06 AM

## 2022-07-23 NOTE — Hospital Course (Addendum)
76 year old female with past medical history of Graves' disease status post radiation therapy, diabetes mellitus, hypertension and diverticulitis with abscess formation status post CT-guided drain placement a few months ago who presented on 8/22 for a scheduled laparoscopic sigmoidectomy.  Patient underwent procedure which was converted to open Hartman's procedure.  Postop, patient developed atrial fibrillation with RVR, hypotension and non-STEMI.  Placed in the ICU for hypotension on pressor support.  Cardiology consulted and patient started on amiodarone drip.  Also developed acute on chronic congestive heart failure with an echocardiogram noting ejection fraction of 45 to 50%.    By 8/25, patient able to be weaned off of pressor support.  Over next few days, patient had worsening respiratory status initially requiring BiPAP and then transition to heated high flow nasal cannula.  Palliative care consulted.  By 8/30, patient's oxygenation able to be weaned down and currently on 4 L nasal cannula.  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  Patient not a candidate for CABG given comorbidities.  Plan for now is to attempt medical therapy.  To date, patient has diuresed over 5.5 L of fluid.  General surgery removed JP drain.  Wound exposed and packed with wet-to-dry dressing.  PT and OT recommending skilled nursing once patient leaves the hospital.

## 2022-07-23 NOTE — Consult Note (Signed)
ANTICOAGULATION CONSULT NOTE  Pharmacy Consult for IV Heparin Indication: chest pain/ACS  Patient Measurements: Height: 5' 2.99" (160 cm) Weight: 70.3 kg (154 lb 15.7 oz) IBW/kg (Calculated) : 52.38 Heparin Dosing Weight: 55.3 kg  Labs: Recent Labs    07/21/22 0306 07/21/22 0656 07/21/22 1416 07/21/22 1638 07/21/22 1947 07/22/22 0540 07/22/22 1500 07/23/22 0244 07/23/22 1328 07/23/22 2203  HGB 9.4*  --   --   --   --  7.9* 7.9* 7.7*  --   --   HCT 30.6*  --   --   --   --  25.7* 25.3* 24.6*  --   --   PLT 351  --   --   --   --  316  --  325  --   --   HEPARINUNFRC  --    < >  --  <0.10*  --  0.10* 0.12* 0.17* 0.35 0.21*  CREATININE 1.01*  --   --   --   --  0.61  --  0.50  --   --   TROPONINIHS  --    < > 13,331* 11,961* 10,573*  --   --   --   --   --    < > = values in this interval not displayed.   Estimated Creatinine Clearance: 56.3 mL/min (by C-G formula based on SCr of 0.5 mg/dL). Medical History: Past Medical History:  Diagnosis Date   Arthritis    hands   Cancer ([place])    thyroid- radiation   Diabetes mellitus without complication (HCC)    Hyperlipidemia    Hypertension    Hypothyroidism    IBS (irritable bowel syndrome)    Medications:  No anticoagulation prior to admission per my chart review  Assessment: Patient is a 76 y/o F with medical history as above who presented to the hospital 8/22 with chronic diverticulitis for laparoscopic sigmoidectomy converted to open Hartmann's procedure. POD # 1 patient declined and critical care medicine was consulted for hemodynamic instability with decreased urine output. Cardiology consulted due to concern for perioperative NSTEMI. Pharmacy consulted to initiate and manage heparin infusion for suspected ACS. Of note, patient also with new-onset Afib with RVR.  Date Time  HL Rate/Comment 0824 0656 <0.1 Subthera; 650 > 850 un/hr 0824 1651 <0.1 Subthera; 850 > 1050 un/hr (Error with initial lab; rerun is  <0.1) 0825 0540 0.1 Subtherapeutic 0825 1500 0.12 Subtherapeutic 0826 0244 0.17 Subtherapeutic  0826 1328 0.35 Subtherapeutic    Goal of Therapy:  Heparin level 0.3-0.7 units/ml Monitor platelets by anticoagulation protocol: Yes   Plan: HL 0.21- subtherapeutic  Heparin bolus 850 units Will continue heparin infusion to 1700 unit/hr.  Recheck heparin level in 8 hours. CBC daily while on heparin.   Glean Salvo, PharmD Clinical Pharmacist  07/23/2022 10:49 PM

## 2022-07-23 NOTE — Plan of Care (Signed)

## 2022-07-23 NOTE — Progress Notes (Addendum)
PHARMACY - TOTAL PARENTERAL NUTRITION CONSULT NOTE   Indication: Massive bowel resection  Patient Measurements: Height: 5' 2.99" (160 cm) Weight: 70.3 kg (154 lb 15.7 oz) IBW/kg (Calculated) : 52.38 TPN AdjBW (KG): 55.3 Body mass index is 27.46 kg/m.  Assessment: 76yoF with a PMH of hypertension, Graves' disease s/p XRT, type 2 diabetes, IBS, diverticulitis with abscess formation s/p CT-guided drain placement in early June 2023 who presented to [facility] for scheduled laparoscopic sigmoidectomy on 07/19/2022.  Glucose / Insulin: mSSI q4h  BG 199-252 previous 24h, 26u SSI required Electrolytes:  Renal: SCr 1.16-->0.61 Hepatic: AST elevated, follow trend MIVF: none GI Imaging: 8/24 Abd xray Normal abdominal gas pattern GI Surgeries / Procedures: laparoscopic sigmoidectomy on 07/19/2022  Central access: 07/21/22 TPN start date: 07/21/22  Nutritional Goals: Goal TPN rate is 70 mL/hr (provides 80 g of protein and 1600 kcals per day)  RD Assessment: Estimated Needs Total Energy Estimated Needs: 1400-1600kcal/day Total Protein Estimated Needs: 70-80g/day Total Fluid Estimated Needs: 1.3-1.5L/day  Current Nutrition:  NPO  Plan:  Decrease TPN to 35 mL/hr at 1800. Pt is now fluid overloading. Plan to d/c TPN tomorrow.  Nutritional components Protein (as 15% Clinisol): 80 grams Dextrose: 14% Lipids (as 20% SMOFilpids): 48 grams kCal 1600 /24h Electrolytes in TPN (standard): Na 7mq/L, K 550m/L, Ca 50m81mL, Mg 50mE14m, and Phos 150mm32m. Cl:Ac 1:1 Medical team ordered Kphos 45 mmol IV x 1 Add standard MVI, thiamine 100 mg (#3/3 days) and trace elements to TPN Insulin: continue Moderate q4h SSI and adjust as needed. Will add 8 units to TPN bag as pt remains hyperglycemic.  Monitor TPN labs on Mon/Thurs, daily until stable  KishaOswald HillockrmD, BCPS 07/23/2022,9:39 AM

## 2022-07-23 NOTE — Progress Notes (Signed)
Patient ID: Sarah [name], female   DOB: 06/28/1946, 76 y.o.   MRN: [id number]     [facility] Day(s): 4.   Interval History: Patient seen and examined, no acute events or new complaints overnight. Patient reports feeling okay.  Denies any worsening abdominal pain.  Denies any nausea or vomiting.  Still feeling weak.  Vital signs in last 24 hours: [min-max] current  Temp:  [98 F (36.7 C)-98.7 F (37.1 C)] 98.2 F (36.8 C) (08/26 0912) Pulse Rate:  [50-120] 102 (08/26 1000) Resp:  [18-31] 29 (08/26 1000) BP: (66-129)/(28-81) 123/68 (08/26 1000) SpO2:  [90 %-100 %] 100 % (08/26 1000) FiO2 (%):  [98 %] 98 % (08/26 0915) Weight:  [70.3 kg] 70.3 kg (08/26 0500)     Height: 5' 2.99" (160 cm) Weight: 70.3 kg BMI (Calculated): 27.46   Physical Exam:  Constitutional: alert, cooperative and no distress  Respiratory: breathing non-labored at rest  Cardiovascular: Mildly tachycardic Gastrointestinal: soft, non-tender, and non-distended.  Ileostomy is pink and patent.  Labs:     Latest Ref Rng & Units 07/23/2022    2:44 AM 07/22/2022    3:00 PM 07/22/2022    5:40 AM  CBC  WBC 4.0 - 10.5 K/uL 12.0   10.4   Hemoglobin 12.0 - 15.0 g/dL 7.7  7.9  7.9   Hematocrit 36.0 - 46.0 % 24.6  25.3  25.7   Platelets 150 - 400 K/uL 325   316       Latest Ref Rng & Units 07/23/2022    2:44 AM 07/22/2022    5:40 AM 07/21/2022    3:06 AM  CMP  Glucose 70 - 99 mg/dL 195  247  156   BUN 8 - 23 mg/dL '25  24  23   '$ Creatinine 0.44 - 1.00 mg/dL 0.50  0.61  1.01   Sodium 135 - 145 mmol/L 134  137  140   Potassium 3.5 - 5.1 mmol/L 3.5  3.5  3.7   Chloride 98 - 111 mmol/L 108  110  113   CO2 22 - 32 mmol/L '23  22  21   '$ Calcium 8.9 - 10.3 mg/dL 7.3  7.8  7.4   Total Protein 6.5 - 8.1 g/dL  5.1    Total Bilirubin 0.3 - 1.2 mg/dL  0.3    Alkaline Phos 38 - 126 U/L  47    AST 15 - 41 U/L  47    ALT 0 - 44 U/L  23      Imaging studies: No new pertinent imaging studies   Assessment/Plan:   76 y.o. female with history of complicated diverticulitis 4 Days Post-Op s/p sigmoid colectomy with end colostomy creation, complicated by pertinent comorbidities including postoperative non-STEMI.  Patient continue with guarded prognosis.  Stable vital signs with mild tachycardia, adequate blood pressure.  No fever  Physical exam shows soft abdomen with adequate ileostomy output.  Will advance diet to full liquids.  If she continue to tolerate full liquids may consider to start decreasing TPN tomorrow.  Appreciate ICU team and hospitalist and cardiology for the management of patient medical comorbidities including non-STEMI.  We will continue to follow closely.   Arnold Long, MD

## 2022-07-23 NOTE — Consult Note (Signed)
ANTICOAGULATION CONSULT NOTE  Pharmacy Consult for IV Heparin Indication: chest pain/ACS  Patient Measurements: Height: 5' 2.99" (160 cm) Weight: 55.3 kg (122 lb) IBW/kg (Calculated) : 52.38 Heparin Dosing Weight: 55.3 kg  Labs: Recent Labs    07/20/22 2141 07/21/22 0306 07/21/22 0656 07/21/22 1416 07/21/22 1638 07/21/22 1947 07/22/22 0540 07/22/22 1500 07/23/22 0244  HGB  --  9.4*  --   --   --   --  7.9* 7.9* 7.7*  HCT  --  30.6*  --   --   --   --  25.7* 25.3* 24.6*  PLT  --  351  --   --   --   --  316  --  325  APTT 44*  --   --   --   --   --   --   --   --   LABPROT 19.5*  --   --   --   --   --   --   --   --   INR 1.7*  --   --   --   --   --   --   --   --   HEPARINUNFRC  --   --    < >  --  <0.10*  --  0.10* 0.12* 0.17*  CREATININE  --  1.01*  --   --   --   --  0.61  --  0.50  TROPONINIHS  --   --    < > 13,331* 11,961* 10,573*  --   --   --    < > = values in this interval not displayed.   Estimated Creatinine Clearance: 49.5 mL/min (by C-G formula based on SCr of 0.5 mg/dL). Medical History: Past Medical History:  Diagnosis Date   Arthritis    hands   Cancer ([place])    thyroid- radiation   Diabetes mellitus without complication (HCC)    Hyperlipidemia    Hypertension    Hypothyroidism    IBS (irritable bowel syndrome)    Medications:  No anticoagulation prior to admission per my chart review  Assessment: Patient is a 76 y/o F with medical history as above who presented to the hospital 8/22 with chronic diverticulitis for laparoscopic sigmoidectomy converted to open Hartmann's procedure. POD # 1 patient declined and critical care medicine was consulted for hemodynamic instability with decreased urine output. Cardiology consulted due to concern for perioperative NSTEMI. Pharmacy consulted to initiate and manage heparin infusion for suspected ACS. Of note, patient also with new-onset Afib with RVR.  Date Time  HL Rate/Comment 0824 0656 <0.1 Subthera;  650 > 850 un/hr 0824 1651 <0.1 Subthera; 850 > 1050 un/hr (Error with initial lab; rerun is <0.1) 0825 0540 0.1 Subtherapeutic 0825 1500 0.12 Subtherapeutic   Goal of Therapy:  Heparin level 0.3-0.7 units/ml Monitor platelets by anticoagulation protocol: Yes   Plan: Give 1700 units bolus x1; then increase heparin infusion to 1600 units/hr Recheck HL in 8 hrs after rate change. Continue to monitor H&H and platelets daily while on heparin gtt.  Renda Rolls, PharmD, [facility] 07/23/2022 3:34 AM

## 2022-07-23 NOTE — Progress Notes (Signed)
NAME:  Sarah [name], MRN:  [id number], DOB:  Apr 18, 1946, LOS: 4 ADMISSION DATE:  07/19/2022, CONSULTATION DATE:  07/20/22 REFERRING MD:  Benjamine Sprague, DO  REASON FOR CONSULT: Hypotension    HPI  Sarah [name] is a 47yoF with a PMH of hypertension, Graves' disease s/p XRT, type 2 diabetes, IBS, diverticulitis with abscess formation s/p CT-guided drain placement in early June 2023 who presented to [facility] for scheduled laparoscopic sigmoidectomy on 07/19/2022.  Hospital Course: Patient underwent robotic assisted laparoscopic sigmoidectomy, converted to open hartman's procedure. Patient was transferred to medical floor post -op. Overnight patient noted to have decreased urine output, hypotensive and tachycardic.  Due to persistent hypotension despite IV fluid resuscitation, patient was started on peripheral Levophed and transferred to the ICU.  PCCM consulted to assist with management. On arrival to the ICU, vital signs showed blood pressure of 98/60 mm Hg, tachycardia of 115 beats per minute, a respiratory rate of 22 breaths per minute, and a temperature of 98.63F (36.7.C).  Patient remained hemodynamically stable not requiring pressors.   Repeat Pertinent Labs Findings: Chemistry:Na+/ K+: 140/2.8 (142/3.3) Glucose:184  BUN/Cr.:23/1.14 (24/1.16) CBC: WBC:5.4 Hgb/Hct:9.6/30.8:  Other Lab findings:   PCT: 1.39 Lactic acid: 3.4 COVID PCR: Negative, Troponin:  6.999>9.494>8.837>11,207 EKG:  sinus tachycardia with rate 118 with premature atrial contractions and premature ventricular contractions, nonspecific ST changes with poor R wave progression.  Pt developed new onset atrial fibrillation with rvr at 1740 heart rate 130-180's.  Amiodarone bolus followed by continuous amiodarone gtt administered.  Pts troponin's trending back up to 11,207 unable to anticoagulate pt s/p robotic sigmoidectomy converted to open Hartman's procedure. Cardiology was consulted. Initially holding heparin per surgery however, patient  noted with worsening symptoms of nausea, Afib RVR with HR's in the 170's despite being on Amiodarone. She was more lethargic drifting in and out of somnolence. Decision made to restart Heparin after further discussion with General surgery due to high risk for decompensation and intubation. PCCM re-consulted.  Past Medical History  Hypertension, Graves' disease s/p XRT, type 2 diabetes, IBS, diverticulitis with abscess formation s/p CT-guided drain placement in early June 2023  [place] [facility] Events   8/22:S/p robotic sigmoidectomy converted to open Hartman's procedure for complicated diverticulitis. 8/23: Transferred to ICU for hypotension. PCCM consulted for pressor.  Developed Afib RVR with elevated troponin concerns for NSTEMI. Cardiology consulted.  Pt received amiodarone bolus followed by amiodarone gtt.  Overnight required additional amiodarone bolus due to recurrent atrial fibrillation with rvr hr 170's and pt symptomatic.  8/24: Pt remains on amiodarone gtt 30 mg/hr and heparin gtt converted to nsr hr 101.  Requiring neo-synephrine gtt a 10 mcg/min to maintain map >65 8/25: Weaned off pressors.  Concern for developing pulmonary edema, stop IVF and diurese. Remains in A-fib (HF mostly 100-110's) requiring Amiodarone gtt. 8/26: Remains off pressors, overnight required BiPAP due to SOB but weaned off this am, A-fib with RVR which responded to IV metoprolol  Consults:  PCCM Cardiology General Surgery  Procedures:  8/22: Robotic assisted laparoscopic sigmoidectomy, converted to open hartman's procedure   Significant Diagnostic Tests:  8/24: Chest Xray>No acute abnormality 8/24: Abdominal xray>No acute abnormality  Micro Data:  8/24: MRSA PCR>>negative 8/24:Urine>>  Antimicrobials:  Vancomycin 8/24>8/24 Ceftriaxone 8/22> Metronidazole 8/22 >  OBJECTIVE  Blood pressure 129/66, pulse 95, temperature 98.7 F (37.1 C), resp. rate (!) 22, height 5' 2.99" (1.6 m), weight 70.3 kg,  SpO2 95 %.       Intake/Output Summary (Last 24  hours) at 07/23/2022 1017 Last data filed at 07/23/2022 5102 Gross per 24 hour  Intake 1778.87 ml  Output 1360 ml  Net 418.87 ml    Filed Weights   07/19/22 1033 07/23/22 0500  Weight: 55.3 kg 70.3 kg   Physical Examination  GENERAL: Acutely-ill appearing female, NAD resting in bed on 4L O2 via nasal canula  HEENT: Head atraumatic, normocephalic. Oropharynx and nasopharynx clear.  Supple, no JVD  LUNGS: Clear throughout with bibasilar crackles, even, mild tachypnea but no overt distress CARDIOVASCULAR: Tachycardia, irregularly irregular rhythm, no r/g, 2+ radial/2+ distal pulses, no edema  ABDOMEN: Hypoactive BS x4, soft, tender, LLQ ileostomy pink/patent draining brown stool  EXTREMITIES: 1+ BLE edema, no cyanosis or clubbing.  NEUROLOGIC:Alert and oriented x3, follows commands, PERRLA SKIN: Midline abdominal incision dressing intact with dried drainage; RLQ JP drain   Labs/imaging that I havepersonally reviewed  (right click and "Reselect all SmartList Selections" daily)     Labs   CBC: Recent Labs  Lab 07/20/22 0327 07/20/22 1155 07/20/22 2032 07/21/22 0306 07/22/22 0540 07/22/22 1500 07/23/22 0244  WBC 5.4  --  11.3* 11.8* 10.4  --  12.0*  HGB 9.6*   < > 8.3* 9.4* 7.9* 7.9* 7.7*  HCT 30.8*   < > 28.1* 30.6* 25.7* 25.3* 24.6*  MCV 88.8  --  91.5 89.7 88.9  --  86.6  PLT 256  --  305 351 316  --  325   < > = values in this interval not displayed.     Basic Metabolic Panel: Recent Labs  Lab 07/20/22 1344 07/20/22 1742 07/20/22 1808 07/20/22 2032 07/21/22 0306 07/22/22 0540 07/23/22 0244  NA  --  142  --  142 140 137 134*  K 3.0* 3.2* 3.2* 3.3* 3.7 3.5 3.5  CL  --  112*  --  112* 113* 110 108  CO2  --  19*  --  22 21* 22 23  GLUCOSE  --  149*  --  163* 156* 247* 195*  BUN  --  23  --  24* 23 24* 25*  CREATININE  --  1.14*  --  1.16* 1.01* 0.61 0.50  CALCIUM  --  7.4*  --  7.3* 7.4* 7.8* 7.3*  MG 2.1  --    --  2.0 2.6* 1.8 1.8  PHOS  --   --  3.4  --   --  2.3* 1.6*    GFR: Estimated Creatinine Clearance: 56.3 mL/min (by C-G formula based on SCr of 0.5 mg/dL). Recent Labs  Lab 07/20/22 2032 07/20/22 2141 07/20/22 2324 07/21/22 0306 07/22/22 0540 07/23/22 0244  PROCALCITON  --  1.39  --  1.37 0.54  --   WBC 11.3*  --   --  11.8* 10.4 12.0*  LATICACIDVEN 3.4*  --  2.3*  --   --   --      Liver Function Tests: Recent Labs  Lab 07/22/22 0540  AST 47*  ALT 23  ALKPHOS 47  BILITOT 0.3  PROT 5.1*  ALBUMIN 1.8*    No results for input(s): "LIPASE", "AMYLASE" in the last 168 hours. No results for input(s): "AMMONIA" in the last 168 hours.  ABG    Component Value Date/Time   PHART 7.37 07/21/2022 0357   PCO2ART 33 07/21/2022 0357   PO2ART 75 (L) 07/21/2022 0357   HCO3 19.1 (L) 07/21/2022 0357   ACIDBASEDEF 5.3 (H) 07/21/2022 0357   O2SAT 96.4 07/21/2022 0357     Coagulation Profile:  Recent Labs  Lab 07/20/22 2141  INR 1.7*     Cardiac Enzymes: No results for input(s): "CKTOTAL", "CKMB", "CKMBINDEX", "TROPONINI" in the last 168 hours.  HbA1C: Hgb A1c MFr Bld  Date/Time Value Ref Range Status  05/30/2022 03:14 AM 8.3 (H) 4.8 - 5.6 % Final    Comment:    (NOTE)         Prediabetes: 5.7 - 6.4         Diabetes: >6.4         Glycemic control for adults with diabetes: <7.0     CBG: Recent Labs  Lab 07/22/22 1121 07/22/22 1603 07/22/22 2018 07/22/22 2356 07/23/22 0359  GLUCAP 204* 213* 211* 161* 199*     Review of Systems:   Positives in BOLD: Gen: Denies fever, chills, weight change, fatigue, night sweats HEENT: Denies blurred vision, double vision, hearing loss, tinnitus, sinus congestion, rhinorrhea, sore throat, neck stiffness, dysphagia PULM: Denies shortness of breath, cough, sputum production, hemoptysis, wheezing CV: Denies chest pain, edema, orthopnea, paroxysmal nocturnal dyspnea, palpitations GI: Denies mild abdominal pain, nausea,  vomiting, diarrhea, hematochezia, melena, constipation, change in bowel habits GU: Denies dysuria, hematuria, polyuria, oliguria, urethral discharge Endocrine: Denies hot or cold intolerance, polyuria, polyphagia or appetite change Derm: Denies rash, dry skin, scaling or peeling skin change Heme: Denies easy bruising, bleeding, bleeding gums Neuro: Denies headache, numbness, weakness, slurred speech, loss of memory or consciousness  Past Medical History  She,  has a past medical history of Arthritis, Cancer ([place]), Diabetes mellitus without complication ([place]), Hyperlipidemia, Hypertension, Hypothyroidism, and IBS (irritable bowel syndrome).   Surgical History    Past Surgical History:  Procedure Laterality Date   ABDOMINAL HYSTERECTOMY     CATARACT EXTRACTION W/PHACO Left 12/21/2016   Procedure: CATARACT EXTRACTION PHACO AND INTRAOCULAR LENS PLACEMENT ([place])  left eye;  Surgeon: Leandrew Koyanagi, MD;  Location: [place];  Service: Ophthalmology;  Laterality: Left;  Diabetic - oral meds   CATARACT EXTRACTION W/PHACO Right 01/25/2017   Procedure: CATARACT EXTRACTION PHACO AND INTRAOCULAR LENS PLACEMENT ([place])  right diabetic;  Surgeon: Leandrew Koyanagi, MD;  Location: [place];  Service: Ophthalmology;  Laterality: Right;  diabetic - oral meds   CHOLECYSTECTOMY     COLONOSCOPY WITH PROPOFOL N/A 10/26/2016   Procedure: COLONOSCOPY WITH PROPOFOL;  Surgeon: Manya Silvas, MD;  Location: [facility];  Service: Endoscopy;  Laterality: N/A;   IR RADIOLOGIST EVAL & MGMT  06/14/2022   KNEE SURGERY Right      Social History   reports that she has never smoked. She has never used smokeless tobacco. She reports that she does not drink alcohol and does not use drugs.   Family History   Her family history is negative for Breast cancer.   Allergies Allergies  Allergen Reactions   Penicillins Anaphylaxis    TOLERATED CEFOTETAN 06/2022   Sulfa Antibiotics Rash      Home Medications  Prior to Admission medications   Medication Sig Start Date End Date Taking? Authorizing Provider  cyanocobalamin 1000 MCG tablet Take 1,000 mcg by mouth daily.   Yes [provider]  glimepiride (AMARYL) 4 MG tablet Take 4 mg by mouth daily with breakfast. 09/29/21 09/29/22 Yes [provider]  levothyroxine (SYNTHROID) 150 MCG tablet Take 150 mcg by mouth daily before breakfast.   Yes [provider]  metFORMIN (GLUCOPHAGE) 500 MG tablet Take 500 mg by mouth 2 (two) times daily with a meal.   Yes [provider]  metoprolol succinate (TOPROL-XL) 50 MG 24 hr tablet Take 50 mg by mouth daily. Take with or immediately following a meal.   Yes [provider]  Olmesartan-Amlodipine-HCTZ 40-10-25 MG TABS Take 1 tablet by mouth 3 (three) times a week. Mon, Wed, Fri   Yes [provider]  ondansetron (ZOFRAN-ODT) 4 MG disintegrating tablet Take 1 tablet (4 mg total) by mouth every 8 (eight) hours as needed for nausea or vomiting. 06/05/22  Yes Arta Silence, MD  potassium chloride (K-DUR) 10 MEQ tablet Take 10 mEq by mouth daily.   Yes [provider]  sertraline (ZOLOFT) 100 MG tablet Take 100 mg by mouth daily.   Yes [provider]  simvastatin (ZOCOR) 20 MG tablet Take 20 mg by mouth daily.   Yes [provider]  venlafaxine (EFFEXOR) 37.5 MG tablet Take 37.5 mg by mouth daily.   Yes [provider]  sodium chloride flush (NS) 0.9 % SOLN 5 mLs by Intracatheter route every 8 (eight) hours. 05/30/22 08/28/22  Benjamine Sprague, DO    Scheduled Meds:  amiodarone  200 mg Oral BID   Followed by   Derrill Memo ON 07/26/2022] amiodarone  200 mg Oral Daily   aspirin  81 mg Oral Daily   atorvastatin  40 mg Oral QHS   Chlorhexidine Gluconate Cloth  6 each Topical Daily   feeding supplement  1 Container Oral TID BM   furosemide  40 mg Intravenous Once   gabapentin  300 mg Oral BID   insulin aspart  0-15  Units Subcutaneous Q4H   levalbuterol  0.63 mg Nebulization Q6H   And   ipratropium  0.5 mg Nebulization Q6H   metoprolol tartrate       metoprolol tartrate  5 mg Intravenous Once   metoprolol tartrate  12.5 mg Oral BID   pantoprazole (PROTONIX) IV  40 mg Intravenous QHS   sertraline  100 mg Oral Daily   sodium chloride flush  10-40 mL Intracatheter Q12H   venlafaxine  37.5 mg Oral Q breakfast   Continuous Infusions:  cefTRIAXone (ROCEPHIN)  IV Stopped (07/22/22 1144)   heparin 1,600 Units/hr (07/23/22 0339)   metronidazole 500 mg (07/22/22 1923)   potassium PHOSPHATE IVPB (in mmol) 45 mmol (07/23/22 0458)   TPN ADULT (ION) 70 mL/hr at 07/22/22 1827   PRN Meds:.HYDROmorphone (DILAUDID) injection, ipratropium-albuterol, metoprolol tartrate, ondansetron **OR** ondansetron (ZOFRAN) IV, mouth rinse, sodium chloride flush   Active Hospital Problem list     Assessment & Plan:   #Sepsis secondary to suspected intraabdominal Infection & UTI #Complicated Diverticulitis s/p robotic sigmoidectomy converted to open Hartman's procedure on 07/19/22 with possible intraabdominal infection  -Monitor fever curve -Trend WBC's & Procalcitonin -Follow cultures as above -Continue empiric Ceftriaxone & Flagyl pending cultures & sensitivities -PRN antiemetic and pain management -Management per general surgery  #NSTEMI  #Acute Decompensated HFrEF (LVEF 45-50% on 07/20/22) #New onset atrial fibrillation with rvr  Shock: Septic +/- Cardiogenic ~ RESOLVED  Echo 08/23: EF 45 to 50%, left ventricle demonstrates global hypokinesis  -Continuous cardiac monitoring -Maintain MAP >65 -Vasopressors as needed to maintain MAP goal ~ weaned off since 8/24 -Trend lactic acid until normalized (3.4 ~ 2.3 ~ ) -HS Troponin peaked at 13,331 -BNP is 1664 -Diuresis as BP and renal function permits ~ give 40 mg IV Lasix x1 on 8/26 -Continue heparin gtt  -Continue PO Amiodarone & Metoprolol -Continue aspirin and  atorvastatin  -Cardiology following appreciate input: considering further ischemic work-up pending clinical course, but favoring  conservative management   Acute Hypoxic Respiratory Failure in the setting of suspected developing pulmonary edema & atelectasis vs. Pneumonia -Supplemental O2 as needed to maintain O2 sats >92% -BiPAP as needed -Follow intermittent Chest X-ray & ABG as needed -Bronchodilators  -ABX as above -Diuresis as BP and renal function permits ~ give 40 mg IV lasix x1 dose 8/26 -Aggressive Pulmonary toilet as able  Normocytic Normochromic Anemia without s/sx of overt blood loss -Monitor for S/Sx of bleeding -Trend CBC -Heparin drip for Anticoagulation/VTE Prophylaxis  -Transfuse for Hgb <7  Mild AKI in the setting of ATN ~ RESOLVED Lactic Acidosis (3.4 ~2.3 ~) -Monitor I&O's / urinary output -Follow BMP -Ensure adequate renal perfusion -Avoid nephrotoxic agents as able -Replace electrolytes as indicated -Pharmacy following for assistance with electrolyte replacement    Type II Diabetes mellitus - CBG's q4hrs - SSI - Hemoglobin A1C pending   Hypothyroidism - Continue Synthroid     Best practice:  Diet:  NPO; continue TPN  Pain/Anxiety/Delirium protocol (if indicated): Yes  VAP protocol (if indicated): Not indicated DVT prophylaxis: Systemic AC GI prophylaxis: PPI Glucose control:  SSI Yes Central venous access: PICC line, and is still needed for TPN Arterial line:  N/A Foley:  Yes, and it is still needed Mobility:  Mobilize as able   PT consulted: N/A Last date of multidisciplinary goals of care discussion [07/22/22] Code Status:  full code Disposition: ICU    Care time: 40 minutes      Sarah [name], AGACNP-BC [facility] [place] epic messenger for cross cover needs If after hours, please call E-link

## 2022-07-23 NOTE — Progress Notes (Addendum)
Progress Note   Patient: Sarah [name] XHB:[id number] DOB: Jan 30, 1946 DOA: 07/19/2022     4 DOS: the patient was seen and examined on 07/23/2022   Brief hospital course: Sarah [name] is a 33yoF with a PMH of hypertension, Graves' disease s/p XRT, type 2 diabetes, IBS, diverticulitis with abscess formation s/p CT-guided drain placement in early June 2023 who presented to [facility] for scheduled laparoscopic sigmoidectomy on 07/19/2022. Patient underwent robotic assisted laparoscopic sigmoidectomy, converted to open hartman's procedure. Postoperatively, patient developed hypotension, atrial fibrillation with RVR and non-STEMI.  Patient was placed on pressor in ICU for hypotension.  Also giving amiodarone gtt.  Troponin went up to 13,000.  She also developed acute on chronic congestive heart failure, echocardiogram showed ejection fraction 45 to 50% with global hypokinesia. Patient condition had improved, transferred to medical floor on 8/25.   Assessment and Plan: Sigmoid diverticulitis.  Status post sigmoidectomy. Bowel function seem to be improving, had significant ostomy output. Patient had an increased procalcitonin level, still treated with Rocephin and Flagyl. Currently, patient is still on TPN, but the liquid diet was started.  Acute hypoxemic respiratory failure secondary to acute congestive heart failure. Acute systolic congestive heart failure secondary to non-STEMI. Non-STEMI. Cardiogenic shock secondary to acute systolic congestive heart failure and NSTEMI. Patient developed significant hypotension postoperatively, she also developed non-STEMI with troponin more than 13,000.  Echocardiogram showed ejection fraction 45 to 50%, I assume this is acute secondary to non-STEMI. Patient also developed acute respiratory failure, currently on 90% of heated high flow. Patient currently is treated with aspirin, atorvastatin, IV heparin as well as beta-blocker. Patient is followed by cardiology. I  personally reviewed patient chest x-ray, significant elevation of BNP, significant short of breath with mid exertion, severe hypoxemia.  Patient is still severely volume overloaded.  I will start IV Lasix 40 mg every 8 hours.  Continue monitor electrolytes closely. Patient is still receiving TPN, complicating patient volume status.  Hopefully TPN can be gradually weaned down. Due to severe hypoxemia, patient be continued monitored in the stepdown unit. Additional work-up for non-STEMI will be determined by cardiology.  Uncontrolled type 2 diabetes with hyperglycemia. Continue current regimen for now on, we will adjust insulin as needed.  New onset atrial fibrillation with RVR. Patient heart rate is still high, blood pressure stable. We will increase beta-blocker to metoprolol 25 mg twice a day. Patient is also treated with IV heparin and amiodarone.  Anemia. Reviewed chart, patient has a chronic anemia, may have a component of blood loss anemia.  Iron level within normal limits, check a B12 level.  Hyponatremia. Hypokalemia. Hypomagnesemia. Hypophosphatemia. Treated with potassium phosphate again.       Subjective:  Patient complaining of significant short of breath with minimal exertion, on 90% heated high flow, BiPAP last night. Patient had a urge for urination, but has a Foley catheter. Significant ostomy output.  No abdominal pain or nausea vomiting, tolerating liquid diet.  Physical Exam: Vitals:   07/23/22 0600 07/23/22 0700 07/23/22 0912 07/23/22 1000  BP: 125/62 129/66 129/66 123/68  Pulse: 100 95 (!) 106 (!) 102  Resp: (!) 27 (!) 22 (!) 31 (!) 29  Temp:   98.2 F (36.8 C)   TempSrc:   Oral   SpO2: 93% 95% 90% 100%  Weight:      Height:       General exam: Appears calm and comfortable  Respiratory system: Significantly diminished breathing sounds at the base. Respiratory effort normal. Cardiovascular system: Irregularly  irregular and tachycardic. no JVD, murmurs,  rubs, gallops or clicks. No pedal edema. Gastrointestinal system: Abdomen is nondistended, soft and nontender. No organomegaly or masses felt. Normal bowel sounds heard. Central nervous system: Alert and oriented x3. No focal neurological deficits. Extremities: 1+ leg edema Skin: No rashes, lesions or ulcers Psychiatry: Judgement and insight appear normal. Mood & affect appropriate. ' Data Reviewed:  Reviewed chest x-ray and image, echocardiogram results.  Reviewed all lab results.  Culture results.  Family Communication: None  Disposition: Status is: Inpatient Remains inpatient appropriate because: Severity of disease, severe hypoxemia.  IV treatment.  Planned Discharge Destination: Skilled nursing facility    Time spent: 100 minutes  Author: Sharen Hones, MD 07/23/2022 12:21 PM  For on call review [email or web address].

## 2022-07-24 ENCOUNTER — Inpatient Hospital Stay: Payer: Medicare Other

## 2022-07-24 DIAGNOSIS — K5792 Diverticulitis of intestine, part unspecified, without perforation or abscess without bleeding: Secondary | ICD-10-CM | POA: Diagnosis not present

## 2022-07-24 LAB — BLOOD GAS, ARTERIAL
Acid-base deficit: 0.7 mmol/L (ref 0.0–2.0)
Bicarbonate: 24.2 mmol/L (ref 20.0–28.0)
Delivery systems: POSITIVE
Expiratory PAP: 7 cmH2O
FIO2: 100 %
Inspiratory PAP: 14 cmH2O
O2 Saturation: 95.8 %
Patient temperature: 37
pCO2 arterial: 40 mmHg (ref 32–48)
pH, Arterial: 7.39 (ref 7.35–7.45)
pO2, Arterial: 72 mmHg — ABNORMAL LOW (ref 83–108)

## 2022-07-24 LAB — BRAIN NATRIURETIC PEPTIDE: B Natriuretic Peptide: 3130.9 pg/mL — ABNORMAL HIGH (ref 0.0–100.0)

## 2022-07-24 LAB — GLUCOSE, CAPILLARY
Glucose-Capillary: 130 mg/dL — ABNORMAL HIGH (ref 70–99)
Glucose-Capillary: 143 mg/dL — ABNORMAL HIGH (ref 70–99)
Glucose-Capillary: 146 mg/dL — ABNORMAL HIGH (ref 70–99)
Glucose-Capillary: 147 mg/dL — ABNORMAL HIGH (ref 70–99)
Glucose-Capillary: 173 mg/dL — ABNORMAL HIGH (ref 70–99)
Glucose-Capillary: 195 mg/dL — ABNORMAL HIGH (ref 70–99)
Glucose-Capillary: 304 mg/dL — ABNORMAL HIGH (ref 70–99)

## 2022-07-24 LAB — BASIC METABOLIC PANEL
Anion gap: 5 (ref 5–15)
BUN: 21 mg/dL (ref 8–23)
CO2: 26 mmol/L (ref 22–32)
Calcium: 7.1 mg/dL — ABNORMAL LOW (ref 8.9–10.3)
Chloride: 108 mmol/L (ref 98–111)
Creatinine, Ser: 0.48 mg/dL (ref 0.44–1.00)
GFR, Estimated: >60 mL/min (ref >60)
Glucose, Bld: 116 mg/dL — ABNORMAL HIGH (ref 70–99)
Potassium: 3.3 mmol/L — ABNORMAL LOW (ref 3.5–5.1)
Sodium: 139 mmol/L (ref 135–145)

## 2022-07-24 LAB — HEPARIN LEVEL (UNFRACTIONATED)
Heparin Unfractionated: 0.37 IU/mL (ref 0.30–0.70)
Heparin Unfractionated: 0.32 IU/mL (ref 0.30–0.70)

## 2022-07-24 LAB — CBC
HCT: 23.8 % — ABNORMAL LOW (ref 36.0–46.0)
Hemoglobin: 7.4 g/dL — ABNORMAL LOW (ref 12.0–15.0)
MCH: 27 pg (ref 26.0–34.0)
MCHC: 31.1 g/dL (ref 30.0–36.0)
MCV: 86.9 fL (ref 80.0–100.0)
Platelets: 298 10*3/uL (ref 150–400)
RBC: 2.74 MIL/uL — ABNORMAL LOW (ref 3.87–5.11)
RDW: 17.3 % — ABNORMAL HIGH (ref 11.5–15.5)
WBC: 9.4 10*3/uL (ref 4.0–10.5)
nRBC: 0.3 % — ABNORMAL HIGH (ref 0.0–0.2)

## 2022-07-24 LAB — RESPIRATORY PANEL BY PCR

## 2022-07-24 LAB — SARS CORONAVIRUS 2 BY RT PCR: SARS Coronavirus 2 by RT PCR: NEGATIVE

## 2022-07-24 LAB — MAGNESIUM: Magnesium: 1.6 mg/dL — ABNORMAL LOW (ref 1.7–2.4)

## 2022-07-24 LAB — PROCALCITONIN: Procalcitonin: 0.22 ng/mL

## 2022-07-24 LAB — PHOSPHORUS: Phosphorus: 2.2 mg/dL — ABNORMAL LOW (ref 2.5–4.6)

## 2022-07-24 LAB — MRSA NEXT GEN BY PCR, NASAL: MRSA by PCR Next Gen: NOT DETECTED

## 2022-07-24 MED ORDER — SODIUM CHLORIDE 0.9 % IV SOLN
1.0000 g | Freq: Three times a day (TID) | INTRAVENOUS | Status: DC
  Administered 2022-07-24 – 2022-07-26 (×7): 1 g via INTRAVENOUS
  Filled 2022-07-24 (×2): qty 1
  Filled 2022-07-24 (×2): qty 20
  Filled 2022-07-24 (×2): qty 1
  Filled 2022-07-24: qty 20
  Filled 2022-07-24: qty 1

## 2022-07-24 MED ORDER — FUROSEMIDE 10 MG/ML IJ SOLN
40.0000 mg | Freq: Two times a day (BID) | INTRAMUSCULAR | Status: DC
Start: 2022-07-24 — End: 2022-08-01
  Administered 2022-07-24 – 2022-08-01 (×16): 40 mg via INTRAVENOUS
  Filled 2022-07-24 (×16): qty 4

## 2022-07-24 MED ORDER — ALBUMIN HUMAN 25 % IV SOLN
25.0000 g | Freq: Four times a day (QID) | INTRAVENOUS | Status: AC
  Administered 2022-07-24 (×4): 25 g via INTRAVENOUS
  Filled 2022-07-24 (×4): qty 100

## 2022-07-24 MED ORDER — POTASSIUM CHLORIDE CRYS ER 20 MEQ PO TBCR
20.0000 meq | EXTENDED_RELEASE_TABLET | Freq: Once | ORAL | Status: AC
Start: 2022-07-24 — End: 2022-07-24
  Administered 2022-07-24: 20 meq via ORAL
  Filled 2022-07-24: qty 1

## 2022-07-24 MED ORDER — METHYLPREDNISOLONE SODIUM SUCC 40 MG IJ SOLR
40.0000 mg | Freq: Two times a day (BID) | INTRAMUSCULAR | Status: DC
  Administered 2022-07-24 – 2022-07-31 (×15): 40 mg via INTRAVENOUS
  Filled 2022-07-24 (×14): qty 1

## 2022-07-24 MED ORDER — ORAL CARE MOUTH RINSE
15.0000 mL | OROMUCOSAL | Status: DC
  Administered 2022-07-24 – 2022-07-25 (×2): 15 mL via OROMUCOSAL

## 2022-07-24 MED ORDER — ENSURE ENLIVE PO LIQD
237.0000 mL | Freq: Three times a day (TID) | ORAL | Status: DC
  Administered 2022-07-25 – 2022-08-01 (×15): 237 mL via ORAL

## 2022-07-24 MED ORDER — VANCOMYCIN HCL 1750 MG/350ML IV SOLN
1750.0000 mg | Freq: Once | INTRAVENOUS | Status: DC
Start: 2022-07-24 — End: 2022-07-24
  Filled 2022-07-24: qty 350

## 2022-07-24 MED ORDER — CALCIUM GLUCONATE-NACL 1-0.675 GM/50ML-% IV SOLN
1.0000 g | Freq: Once | INTRAVENOUS | Status: AC
  Administered 2022-07-24: 1000 mg via INTRAVENOUS
  Filled 2022-07-24: qty 50

## 2022-07-24 MED ORDER — FLUCONAZOLE IN SODIUM CHLORIDE 200-0.9 MG/100ML-% IV SOLN
200.0000 mg | Freq: Once | INTRAVENOUS | Status: AC
Start: 2022-07-24 — End: 2022-07-25
  Administered 2022-07-24: 200 mg via INTRAVENOUS
  Filled 2022-07-24: qty 100

## 2022-07-24 MED ORDER — PHENYLEPHRINE HCL-NACL 20-0.9 MG/250ML-% IV SOLN
0.0000 ug/min | INTRAVENOUS | Status: DC
  Administered 2022-07-24: 20 ug/min via INTRAVENOUS
  Filled 2022-07-24: qty 250

## 2022-07-24 MED ORDER — POTASSIUM PHOSPHATES 15 MMOLE/5ML IV SOLN
15.0000 mmol | Freq: Once | INTRAVENOUS | Status: AC
  Administered 2022-07-24: 15 mmol via INTRAVENOUS
  Filled 2022-07-24: qty 5

## 2022-07-24 MED ORDER — ORAL CARE MOUTH RINSE: 15.0000 mL | OROMUCOSAL | Status: DC | PRN

## 2022-07-24 MED ORDER — POTASSIUM CHLORIDE 10 MEQ/100ML IV SOLN
10.0000 meq | INTRAVENOUS | Status: AC
  Administered 2022-07-24 (×2): 10 meq via INTRAVENOUS
  Filled 2022-07-24 (×2): qty 100

## 2022-07-24 MED ORDER — MAGNESIUM SULFATE 4 GM/100ML IV SOLN
4.0000 g | Freq: Once | INTRAVENOUS | Status: AC
  Administered 2022-07-24: 4 g via INTRAVENOUS
  Filled 2022-07-24: qty 100

## 2022-07-24 MED ORDER — VANCOMYCIN HCL 1250 MG/250ML IV SOLN: 1250.0000 mg | INTRAVENOUS | Status: DC

## 2022-07-24 NOTE — Progress Notes (Signed)
Patient is on ICU service. Will see patient when able to transfer out.

## 2022-07-24 NOTE — Progress Notes (Addendum)
NAME:  Sarah [name], MRN:  [id number], DOB:  01-17-1946, LOS: 5 ADMISSION DATE:  07/19/2022, CONSULTATION DATE:  07/20/22 REFERRING MD:  Benjamine Sprague, DO  REASON FOR CONSULT: Hypotension    HPI  Sarah [name] is a 28yoF with a PMH of hypertension, Graves' disease s/p XRT, type 2 diabetes, IBS, diverticulitis with abscess formation s/p CT-guided drain placement in early June 2023 who presented to [facility] for scheduled laparoscopic sigmoidectomy on 07/19/2022.  Hospital Course: Patient underwent robotic assisted laparoscopic sigmoidectomy, converted to open hartman's procedure. Patient was transferred to medical floor post -op. Overnight patient noted to have decreased urine output, hypotensive and tachycardic.  Due to persistent hypotension despite IV fluid resuscitation, patient was started on peripheral Levophed and transferred to the ICU.  PCCM consulted to assist with management. On arrival to the ICU, vital signs showed blood pressure of 98/60 mm Hg, tachycardia of 115 beats per minute, a respiratory rate of 22 breaths per minute, and a temperature of 98.94F (36.7.C).  Patient remained hemodynamically stable not requiring pressors.   Repeat Pertinent Labs Findings: Chemistry:Na+/ K+: 140/2.8 (142/3.3) Glucose:184  BUN/Cr.:23/1.14 (24/1.16) CBC: WBC:5.4 Hgb/Hct:9.6/30.8:  Other Lab findings:   PCT: 1.39 Lactic acid: 3.4 COVID PCR: Negative, Troponin:  6.999>9.494>8.837>11,207 EKG:  sinus tachycardia with rate 118 with premature atrial contractions and premature ventricular contractions, nonspecific ST changes with poor R wave progression.  Pt developed new onset atrial fibrillation with rvr at 1740 heart rate 130-180's.  Amiodarone bolus followed by continuous amiodarone gtt administered.  Pts troponin's trending back up to 11,207 unable to anticoagulate pt s/p robotic sigmoidectomy converted to open Hartman's procedure. Cardiology was consulted. Initially holding heparin per surgery however, patient  noted with worsening symptoms of nausea, Afib RVR with HR's in the 170's despite being on Amiodarone. She was more lethargic drifting in and out of somnolence. Decision made to restart Heparin after further discussion with General surgery due to high risk for decompensation and intubation. PCCM re-consulted.  Past Medical History  Hypertension, Graves' disease s/p XRT, type 2 diabetes, IBS, diverticulitis with abscess formation s/p CT-guided drain placement in early June 2023  [place] Hospital Events   8/22:S/p robotic sigmoidectomy converted to open Hartman's procedure for complicated diverticulitis. 8/23: Transferred to ICU for hypotension. PCCM consulted for pressor.  Developed Afib RVR with elevated troponin concerns for NSTEMI. Cardiology consulted.  Pt received amiodarone bolus followed by amiodarone gtt.  Overnight required additional amiodarone bolus due to recurrent atrial fibrillation with rvr hr 170's and pt symptomatic.  8/24: Pt remains on amiodarone gtt 30 mg/hr and heparin gtt converted to nsr hr 101.  Requiring neo-synephrine gtt a 10 mcg/min to maintain map >65 8/25: Weaned off pressors.  Concern for developing pulmonary edema, stop IVF and diurese. Remains in A-fib (HF mostly 100-110's) requiring Amiodarone gtt. 8/26: Remains off pressors, overnight required BiPAP due to SOB but weaned off this am, A-fib with RVR which responded to IV metoprolol 8/27: Worsening respiratory status, on BiPAP, high risk for intubation.  ABX broadened to Meropenem due to concern for developing HCAP.  Aggressive diuresis, start steroids.  Consult Palliative Care for [place] discussions  Consults:  PCCM Cardiology General Surgery Palliative Care  Procedures:  8/22: Robotic assisted laparoscopic sigmoidectomy, converted to open hartman's procedure   Significant Diagnostic Tests:  8/24: Chest Xray>No acute abnormality 8/24: Abdominal xray>No acute abnormality 8/27: CXR>>IMPRESSION: 1. The linear  stripe over the lateral right mid chest is likely a skin fold as there are lung  markings on both sides. No convincing evidence of pneumothorax. Recommend attention on follow-up. 2. Stable right PICC line. 3. Increasing bilateral perihilar opacities. While pulmonary edema is possible, the findings could represent a multifocal infectious process/pneumonia. Recommend clinical correlation and attention on follow-up.  Micro Data:  8/24: MRSA PCR>>negative 8/24:Urine>> 40,000 colonies yeast 8/27: MRSA PCR>> 8/27: Covid PCR>> 8/27: RVP >>  Antimicrobials:  Vancomycin 8/24>8/24 Ceftriaxone 8/22>8/27 Metronidazole 8/22 >8/27 Fluconazole 8/27 x1 dose Meropenem 8/27>>  OBJECTIVE  Blood pressure (!) 115/47, pulse 99, temperature (!) 97.5 F (36.4 C), temperature source Axillary, resp. rate (!) 32, height 5' 2.99" (1.6 m), weight 70.3 kg, SpO2 92 %.   FiO2 (%):  [89 %-100 %] 100 %   Intake/Output Summary (Last 24 hours) at 07/24/2022 0748 Last data filed at 07/24/2022 0730 Gross per 24 hour  Intake 653.67 ml  Output 4125 ml  Net -3471.33 ml    Filed Weights   07/19/22 1033 07/23/22 0500  Weight: 55.3 kg 70.3 kg   Physical Examination  GENERAL: Critically-ill appearing female, on BiPAP with mild respiratory distress HEENT: Head atraumatic, normocephalic. Oropharynx and nasopharynx clear.  Supple, no JVD  LUNGS: Coarse breath sounds throughout, even, BiPAP assisted, tachypnea (RR30s) CARDIOVASCULAR: Tachycardia, irregularly irregular rhythm, no r/g, 2+ radial/2+ distal pulses, no edema  ABDOMEN: Hypoactive BS x4, soft, tender, LLQ ileostomy pink/patent draining brown stool  EXTREMITIES: 1+ BLE edema, no cyanosis or clubbing.  NEUROLOGIC:Alert and oriented x3, follows commands, PERRLA SKIN: Midline abdominal incision dressing intact with dried drainage; RLQ JP drain   Labs/imaging that I havepersonally reviewed  (right click and "Reselect all SmartList Selections" daily)     Labs    CBC: Recent Labs  Lab 07/20/22 2032 07/21/22 0306 07/22/22 0540 07/22/22 1500 07/23/22 0244 07/24/22 0109  WBC 11.3* 11.8* 10.4  --  12.0* 9.4  HGB 8.3* 9.4* 7.9* 7.9* 7.7* 7.4*  HCT 28.1* 30.6* 25.7* 25.3* 24.6* 23.8*  MCV 91.5 89.7 88.9  --  86.6 86.9  PLT 305 351 316  --  325 298     Basic Metabolic Panel: Recent Labs  Lab 07/20/22 1808 07/20/22 2032 07/21/22 0306 07/22/22 0540 07/23/22 0244 07/23/22 2203 07/24/22 0109 07/24/22 0241  NA  --  142 140 137 134*  --  139  --   K 3.2* 3.3* 3.7 3.5 3.5  --  3.3*  --   CL  --  112* 113* 110 108  --  108  --   CO2  --  22 21* 22 23  --  26  --   GLUCOSE  --  163* 156* 247* 195*  --  116*  --   BUN  --  24* 23 24* 25*  --  21  --   CREATININE  --  1.16* 1.01* 0.61 0.50  --  0.48  --   CALCIUM  --  7.3* 7.4* 7.8* 7.3*  --  7.1*  --   MG  --  2.0 2.6* 1.8 1.8  --   --  1.6*  PHOS 3.4  --   --  2.3* 1.6* 2.6  --  2.2*    GFR: Estimated Creatinine Clearance: 56.3 mL/min (by C-G formula based on SCr of 0.48 mg/dL). Recent Labs  Lab 07/20/22 2032 07/20/22 2141 07/20/22 2324 07/21/22 0306 07/22/22 0540 07/23/22 0244 07/24/22 0109  PROCALCITON  --  1.39  --  1.37 0.54  --   --   WBC 11.3*  --   --  11.8* 10.4  12.0* 9.4  LATICACIDVEN 3.4*  --  2.3*  --   --   --   --      Liver Function Tests: Recent Labs  Lab 07/22/22 0540  AST 47*  ALT 23  ALKPHOS 47  BILITOT 0.3  PROT 5.1*  ALBUMIN 1.8*    No results for input(s): "LIPASE", "AMYLASE" in the last 168 hours. No results for input(s): "AMMONIA" in the last 168 hours.  ABG    Component Value Date/Time   PHART 7.39 07/24/2022 0616   PCO2ART 40 07/24/2022 0616   PO2ART 72 (L) 07/24/2022 0616   HCO3 24.2 07/24/2022 0616   ACIDBASEDEF 0.7 07/24/2022 0616   O2SAT 95.8 07/24/2022 0616     Coagulation Profile: Recent Labs  Lab 07/20/22 2141  INR 1.7*     Cardiac Enzymes: No results for input(s): "CKTOTAL", "CKMB", "CKMBINDEX", "TROPONINI" in the  last 168 hours.  HbA1C: Hgb A1c MFr Bld  Date/Time Value Ref Range Status  05/30/2022 03:14 AM 8.3 (H) 4.8 - 5.6 % Final    Comment:    (NOTE)         Prediabetes: 5.7 - 6.4         Diabetes: >6.4         Glycemic control for adults with diabetes: <7.0     CBG: Recent Labs  Lab 07/23/22 1616 07/23/22 2202 07/23/22 2345 07/24/22 0356 07/24/22 0733  GLUCAP 329* 155* 117* 130* 147*     Review of Systems:   Positives in BOLD: Gen: Denies fever, chills, weight change, fatigue, night sweats HEENT: Denies blurred vision, double vision, hearing loss, tinnitus, sinus congestion, rhinorrhea, sore throat, neck stiffness, dysphagia PULM: Denies shortness of breath, cough, sputum production, hemoptysis, wheezing CV: Denies chest pain, edema, orthopnea, paroxysmal nocturnal dyspnea, palpitations GI: Denies mild abdominal pain, nausea, vomiting, diarrhea, hematochezia, melena, constipation, change in bowel habits GU: Denies dysuria, hematuria, polyuria, oliguria, urethral discharge Endocrine: Denies hot or cold intolerance, polyuria, polyphagia or appetite change Derm: Denies rash, dry skin, scaling or peeling skin change Heme: Denies easy bruising, bleeding, bleeding gums Neuro: Denies headache, numbness, weakness, slurred speech, loss of memory or consciousness  Past Medical History  She,  has a past medical history of Arthritis, Cancer ([place]), Diabetes mellitus without complication ([place]), Hyperlipidemia, Hypertension, Hypothyroidism, and IBS (irritable bowel syndrome).   Surgical History    Past Surgical History:  Procedure Laterality Date   ABDOMINAL HYSTERECTOMY     CATARACT EXTRACTION W/PHACO Left 12/21/2016   Procedure: CATARACT EXTRACTION PHACO AND INTRAOCULAR LENS PLACEMENT ([place])  left eye;  Surgeon: Leandrew Koyanagi, MD;  Location: [facility];  Service: Ophthalmology;  Laterality: Left;  Diabetic - oral meds   CATARACT EXTRACTION W/PHACO Right 01/25/2017    Procedure: CATARACT EXTRACTION PHACO AND INTRAOCULAR LENS PLACEMENT ([place])  right diabetic;  Surgeon: Leandrew Koyanagi, MD;  Location: [facility];  Service: Ophthalmology;  Laterality: Right;  diabetic - oral meds   CHOLECYSTECTOMY     COLONOSCOPY WITH PROPOFOL N/A 10/26/2016   Procedure: COLONOSCOPY WITH PROPOFOL;  Surgeon: Manya Silvas, MD;  Location: [facility] ENDOSCOPY;  Service: Endoscopy;  Laterality: N/A;   IR RADIOLOGIST EVAL & MGMT  06/14/2022   KNEE SURGERY Right      Social History   reports that she has never smoked. She has never used smokeless tobacco. She reports that she does not drink alcohol and does not use drugs.   Family History   Her family history is negative for Breast  cancer.   Allergies Allergies  Allergen Reactions   Penicillins Anaphylaxis    TOLERATED CEFOTETAN 06/2022   Sulfa Antibiotics Rash     Home Medications  Prior to Admission medications   Medication Sig Start Date End Date Taking? Authorizing Provider  cyanocobalamin 1000 MCG tablet Take 1,000 mcg by mouth daily.   Yes [provider]  glimepiride (AMARYL) 4 MG tablet Take 4 mg by mouth daily with breakfast. 09/29/21 09/29/22 Yes [provider]  levothyroxine (SYNTHROID) 150 MCG tablet Take 150 mcg by mouth daily before breakfast.   Yes [provider]  metFORMIN (GLUCOPHAGE) 500 MG tablet Take 500 mg by mouth 2 (two) times daily with a meal.   Yes [provider]  metoprolol succinate (TOPROL-XL) 50 MG 24 hr tablet Take 50 mg by mouth daily. Take with or immediately following a meal.   Yes [provider]  Olmesartan-Amlodipine-HCTZ 40-10-25 MG TABS Take 1 tablet by mouth 3 (three) times a week. Mon, Wed, Fri   Yes [provider]  ondansetron (ZOFRAN-ODT) 4 MG disintegrating tablet Take 1 tablet (4 mg total) by mouth every 8 (eight) hours as needed for nausea or vomiting. 06/05/22  Yes Arta Silence, MD  potassium chloride  (K-DUR) 10 MEQ tablet Take 10 mEq by mouth daily.   Yes [provider]  sertraline (ZOLOFT) 100 MG tablet Take 100 mg by mouth daily.   Yes [provider]  simvastatin (ZOCOR) 20 MG tablet Take 20 mg by mouth daily.   Yes [provider]  venlafaxine (EFFEXOR) 37.5 MG tablet Take 37.5 mg by mouth daily.   Yes [provider]  sodium chloride flush (NS) 0.9 % SOLN 5 mLs by Intracatheter route every 8 (eight) hours. 05/30/22 08/28/22  Benjamine Sprague, DO    Scheduled Meds:  amiodarone  200 mg Oral BID   Followed by   Derrill Memo ON 07/26/2022] amiodarone  200 mg Oral Daily   aspirin  81 mg Oral Daily   atorvastatin  40 mg Oral QHS   Chlorhexidine Gluconate Cloth  6 each Topical Daily   feeding supplement  1 Container Oral TID BM   furosemide  40 mg Intravenous Q8H   gabapentin  300 mg Oral BID   insulin aspart  0-20 Units Subcutaneous Q4H   insulin aspart  5 Units Subcutaneous TID WC   levalbuterol  0.63 mg Nebulization Q6H   And   ipratropium  0.5 mg Nebulization Q6H   metoprolol tartrate  25 mg Oral BID   pantoprazole (PROTONIX) IV  40 mg Intravenous QHS   sertraline  100 mg Oral Daily   sodium chloride flush  10-40 mL Intracatheter Q12H   venlafaxine  37.5 mg Oral Q breakfast   Continuous Infusions:  albumin human 25 g (07/24/22 0311)   heparin 1,700 Units/hr (07/24/22 0341)   potassium PHOSPHATE IVPB (in mmol) 15 mmol (07/24/22 0434)   TPN ADULT (ION) 35 mL/hr at 07/24/22 0700   PRN Meds:.HYDROmorphone (DILAUDID) injection, ipratropium-albuterol, ondansetron **OR** ondansetron (ZOFRAN) IV, mouth rinse, sodium chloride flush   Active Hospital Problem list     Assessment & Plan:   #Sepsis secondary to suspected intraabdominal Infection & UTI, concern for developing HCAP #Complicated Diverticulitis s/p robotic sigmoidectomy converted to open Hartman's procedure on 07/19/22 with possible intraabdominal infection  -Monitor fever curve -Trend WBC's &  Procalcitonin -Follow cultures as above -ABX broadened to Meropenem 8/27 (considering Vancomycin, however given no fever, no leukocytosis, no sputum production, and PCT trending  down, will obtain repeat MRSA PCR to see if + before starting Vancomycin) -PRN antiemetic and pain management -Management per general surgery  #NSTEMI  #Acute Decompensated HFrEF (LVEF 45-50% on 07/20/22) #New onset atrial fibrillation with rvr  Shock: Septic +/- Cardiogenic ~ RESOLVED  Echo 08/23: EF 45 to 50%, left ventricle demonstrates global hypokinesis  -Continuous cardiac monitoring -Maintain MAP >65 -Vasopressors as needed to maintain MAP goal ~ weaned off since 8/24 -Trend lactic acid until normalized (3.4 ~ 2.3 ~ ) -HS Troponin peaked at 13,331 -BNP is 1664 -Diuresis as BP and renal function permits ~ currently on 40 mg IV Lasix q12h -Continue heparin gtt  -Continue PO Amiodarone & Metoprolol -Continue aspirin and atorvastatin  -Cardiology following appreciate input: considering further ischemic work-up pending clinical course, but favoring conservative management   Acute Hypoxic Respiratory Failure in the setting of pulmonary edema & suspected developing HCAP -Supplemental O2 as needed to maintain O2 sats >92% -BiPAP as needed -High risk for intubation -Follow intermittent Chest X-ray & ABG as needed -Bronchodilators  -ABX as above (broadened to Meropenem 8/27) -Diuresis as BP and renal function permits  -Check CRP, start Steroids -Aggressive Pulmonary toilet as able  Normocytic Normochromic Anemia without s/sx of overt blood loss -Monitor for S/Sx of bleeding -Trend CBC -Heparin drip for Anticoagulation/VTE Prophylaxis  -Transfuse for Hgb <7  Mild AKI in the setting of ATN ~ RESOLVED Mild Hypokalemia Mild Hypomagnesemia Lactic Acidosis (3.4 ~2.3 ~) -Monitor I&O's / urinary output -Follow BMP -Ensure adequate renal perfusion -Avoid nephrotoxic agents as able -Replace electrolytes as  indicated -Pharmacy following for assistance with electrolyte replacement    Type II Diabetes mellitus - CBG's q4hrs - SSI - Hemoglobin A1C pending   Hypothyroidism - Continue Synthroid    Pt is critically ill, high risk for further decompensation, need for intubation, cardiac arrest and death.  Discussed GOC with pt, she confirms Full Code status and that she would want to be intubated if her respiratory status should worsen.  Will consult Palliative Care to assist with further [place] discussions.    Best practice:  Diet:  NPO; continue TPN  Pain/Anxiety/Delirium protocol (if indicated): Yes  VAP protocol (if indicated): Not indicated DVT prophylaxis: Systemic AC GI prophylaxis: PPI Glucose control:  SSI Yes Central venous access: PICC line, and is still needed for TPN Arterial line:  N/A Foley:  Yes, and it is still needed Mobility:  Mobilize as able   PT consulted: N/A Last date of multidisciplinary goals of care discussion [07/22/22] Code Status:  full code Disposition: ICU    Care time: 40 minutes      Darel Hong, AGACNP-BC [facility] epic messenger for cross cover needs If after hours, please call E-link

## 2022-07-24 NOTE — Consult Note (Addendum)
Pharmacy Antibiotic Note  Sarah [name] is a 76 y.o. female admitted on 07/19/2022 with  chronic diverticulitis for laparoscopic sigmoidectomy converted to open Hartmann's procedure . POD # 1 patient decompensated with hemodynamic instability and cardiology was consulted for NSTEMI. She is further with acute respiratory failure requiring BiPAP. Patient has received several days of ceftriaxone and metronidazole for intra-abdominal and urinary tract infections. Team has now decided to escalate therapy given lack of improvement. Pharmacy has been consulted for meropenem and vancomycin dosing.  Plan:  Meropenem 1 g IV q8h  Vancomycin 1.75 g IV followed by vancomycin 1.25 g IV q24h --Calculated AUC: 543, Cmin 13.4 --Daily Scr per protocol --Levels at steady sate or as clinically indicated  Height: 5' 2.99" (160 cm) Weight: 70.3 kg (154 lb 15.7 oz) IBW/kg (Calculated) : 52.38  Temp (24hrs), Avg:97.8 F (36.6 C), Min:97.5 F (36.4 C), Max:98.2 F (36.8 C)  Recent Labs  Lab 07/20/22 2032 07/20/22 2324 07/21/22 0306 07/22/22 0540 07/23/22 0244 07/24/22 0109  WBC 11.3*  --  11.8* 10.4 12.0* 9.4  CREATININE 1.16*  --  1.01* 0.61 0.50 0.48  LATICACIDVEN 3.4* 2.3*  --   --   --   --     Estimated Creatinine Clearance: 56.3 mL/min (by C-G formula based on SCr of 0.48 mg/dL).    Allergies  Allergen Reactions   Penicillins Anaphylaxis    TOLERATED CEFOTETAN 06/2022   Sulfa Antibiotics Rash    Antimicrobials this admission: Cefotetan 8/22 (surgical prophylaxis) Ceftriaxone 8/23 >> 8/26 Metronidazole 8/23 >> 8/26 Vancomycin 8/24 x 1 Meropenem 8/27 >>  Dose adjustments this admission: N/A  Microbiology results: 8/24 UCx: 40k CFU/mL yeast  8/24 MRSA PCR: (-)  Thank you for allowing pharmacy to be a part of this patient's care.  Benita Gutter 07/24/2022 8:17 AM

## 2022-07-24 NOTE — Progress Notes (Signed)
Placed patient back on bipap  

## 2022-07-24 NOTE — Consult Note (Signed)
ANTICOAGULATION CONSULT NOTE  Pharmacy Consult for IV Heparin Indication: chest pain/ACS  Patient Measurements: Height: 5' 2.99" (160 cm) Weight: 70.3 kg (154 lb 15.7 oz) IBW/kg (Calculated) : 52.38 Heparin Dosing Weight: 55.3 kg  Labs: Recent Labs    07/21/22 1416 07/21/22 1638 07/21/22 1638 07/21/22 1947 07/22/22 0540 07/22/22 1500 07/23/22 0244 07/23/22 1328 07/23/22 2203 07/24/22 0109 07/24/22 0812  HGB  --   --    < >  --  7.9* 7.9* 7.7*  --   --  7.4*  --   HCT  --   --    < >  --  25.7* 25.3* 24.6*  --   --  23.8*  --   PLT  --   --   --   --  316  --  325  --   --  298  --   HEPARINUNFRC  --  <0.10*   < >  --  0.10* 0.12* 0.17* 0.35 0.21*  --  0.37  CREATININE  --   --   --   --  0.61  --  0.50  --   --  0.48  --   TROPONINIHS 13,331* 11,961*  --  10,573*  --   --   --   --   --   --   --    < > = values in this interval not displayed.   Estimated Creatinine Clearance: 56.3 mL/min (by C-G formula based on SCr of 0.48 mg/dL). Medical History: Past Medical History:  Diagnosis Date   Arthritis    hands   Cancer ([place])    thyroid- radiation   Diabetes mellitus without complication ([facility])    Hyperlipidemia    Hypertension    Hypothyroidism    IBS (irritable bowel syndrome)    Medications:  No anticoagulation prior to admission per my chart review  Assessment: Patient is a 76 y/o F with medical history as above who presented to the hospital 8/22 with chronic diverticulitis for laparoscopic sigmoidectomy converted to open Hartmann's procedure. POD # 1 patient declined and critical care medicine was consulted for hemodynamic instability with decreased urine output. Cardiology consulted due to concern for perioperative NSTEMI. Pharmacy consulted to initiate and manage heparin infusion for suspected ACS. Of note, patient also with new-onset Afib with RVR.  Date Time  HL Rate/Comment 0824 0656 <0.1 Subthera; 650 > 850 un/hr 0824 1651 <0.1 Subthera; 850 > 1050 un/hr  (Error with initial lab; rerun is <0.1) 0825 0540 0.1 Subtherapeutic 0825 1500 0.12 Subtherapeutic 0826 0244 0.17 Subtherapeutic  0826 1328 0.35 therapeutic  0826 2203 0.21 Subtherapeutic  0827 0812 0.37 Therapeutic    Goal of Therapy:  Heparin level 0.3-0.7 units/ml Monitor platelets by anticoagulation protocol: Yes   Plan:  Heparin level is therapeutic. Will continue heparin infusion infusion at 1700 units/hr. Recheck heparin level in 8 hours. CBC daily while on heparin.    Eleonore Chiquito, PharmD, BCPS Clinical Pharmacist  07/24/2022 10:11 AM

## 2022-07-24 NOTE — Progress Notes (Signed)
Patient ID: Sarah [name], female   DOB: [birth year], 76 y.o.   MRN: [id number]     [place] Hospital Day(s): 5.   Interval History: Patient seen and examined, no acute events or new complaints overnight.  Was found sleeping.  She was found with increased respiratory effort.  She was on BiPAP.  She was arousable.  Vital signs in last 24 hours: [min-max] current  Temp:  [97.5 F (36.4 C)-98 F (36.7 C)] 97.5 F (36.4 C) (08/27 0726) Pulse Rate:  [51-140] 99 (08/27 0726) Resp:  [23-38] 32 (08/27 0726) BP: (101-141)/(47-85) 115/47 (08/27 0700) SpO2:  [87 %-100 %] 97 % (08/27 0726) FiO2 (%):  [89 %-100 %] 100 % (08/27 0726)     Height: 5' 2.99" (160 cm) Weight: 70.3 kg BMI (Calculated): 27.46   Physical Exam:  Constitutional: Critically ill Respiratory: On BiPAP with vascular crackles Cardiovascular: Tachycardic Gastrointestinal: soft, non-tender, and non-distended.  Wounds are dry and clean.  Labs:     Latest Ref Rng & Units 07/24/2022    1:09 AM 07/23/2022    2:44 AM 07/22/2022    3:00 PM  CBC  WBC 4.0 - 10.5 K/uL 9.4  12.0    Hemoglobin 12.0 - 15.0 g/dL 7.4  7.7  7.9   Hematocrit 36.0 - 46.0 % 23.8  24.6  25.3   Platelets 150 - 400 K/uL 298  325        Latest Ref Rng & Units 07/24/2022    1:09 AM 07/23/2022    2:44 AM 07/22/2022    5:40 AM  CMP  Glucose 70 - 99 mg/dL 116  195  247   BUN 8 - 23 mg/dL '21  25  24   '$ Creatinine 0.44 - 1.00 mg/dL 0.48  0.50  0.61   Sodium 135 - 145 mmol/L 139  134  137   Potassium 3.5 - 5.1 mmol/L 3.3  3.5  3.5   Chloride 98 - 111 mmol/L 108  108  110   CO2 22 - 32 mmol/L '26  23  22   '$ Calcium 8.9 - 10.3 mg/dL 7.1  7.3  7.8   Total Protein 6.5 - 8.1 g/dL   5.1   Total Bilirubin 0.3 - 1.2 mg/dL   0.3   Alkaline Phos 38 - 126 U/L   47   AST 15 - 41 U/L   47   ALT 0 - 44 U/L   23     Imaging studies: Chest x-ray shows bilateral perihilar opacities.  Pulmonary edema.   Assessment/Plan:  76 y.o. female with history of complicated  diverticulitis 5 Days Post-Op s/p sigmoid colectomy with end colostomy creation, complicated by pertinent comorbidities including postoperative non-STEMI.   Patient continue with guarded prognosis.  Stable vital signs with mild tachycardia, adequate blood pressure.  No fever. -She was found with increased respiratory effort.  This could be due to volume overload versus early HCAP.  Antibiotic therapy adjusted to cover HCAP.  Patient on BiPAP as per ICU team.   Physical exam shows soft abdomen with adequate ileostomy output.  We will discontinue TPN due to volume overload.  Once patient is stabilized from respiratory status she should be able to tolerate soft diet.  If she looks too debilitated for some diet and she can start on full liquids.   Appreciate ICU team and hospitalist and cardiology for the management of patient medical comorbidities including non-STEMI, volume overload, possible HCAP.  Arnold Long, MD

## 2022-07-24 NOTE — Progress Notes (Signed)
Pt requested to go back in [facility]. Patient placed back on at previous 55L/90% settings, sats are 96% at this time, RN aware of change.

## 2022-07-24 NOTE — Progress Notes (Signed)
Updated pt's brother Charlotte Crumb Berkel via telephone.  Discussed tenuous respiratory status intermittently requiring BiPAP (with high risk for intubation) due to CHF and concern for developing HCAP.  All questions answered to his satisfaction, appreciative of update.   Darel Hong, AGACNP-BC [facility] Prefer epic messenger for cross cover needs If after hours, please call E-link

## 2022-07-24 NOTE — Progress Notes (Addendum)
PHARMACY - TOTAL PARENTERAL NUTRITION CONSULT NOTE   Indication: Massive bowel resection  Patient Measurements: Height: 5' 2.99" (160 cm) Weight: 70.3 kg (154 lb 15.7 oz) IBW/kg (Calculated) : 52.38 TPN AdjBW (KG): 55.3 Body mass index is 27.46 kg/m.  Assessment: 76yoF with a PMH of hypertension, Graves' disease s/p XRT, type 2 diabetes, IBS, diverticulitis with abscess formation s/p CT-guided drain placement in early June 2023 who presented to [facility] for scheduled laparoscopic sigmoidectomy on 07/19/2022.  Glucose / Insulin: mSSI q4h  BG 199-252 > 147 previous 24h, 44u SSI required Electrolytes:  Renal: SCr 1.16-->0.61 Hepatic: AST elevated, follow trend MIVF: none GI Imaging: 8/24 Abd xray Normal abdominal gas pattern GI Surgeries / Procedures: laparoscopic sigmoidectomy on 07/19/2022  Central access: 07/21/22 TPN start date: 07/21/22  Nutritional Goals: Goal TPN rate is 70 mL/hr (provides 80 g of protein and 1600 kcals per day)  RD Assessment: Estimated Needs Total Energy Estimated Needs: 1400-1600kcal/day Total Protein Estimated Needs: 70-80g/day Total Fluid Estimated Needs: 1.3-1.5L/day  Current Nutrition:  NPO  Plan:  Will plan to stop TPN today, after current bag is completed. Pt is now on a liquid diet.  Pt ordered Kphos 15 mmol IV x 1 + KCL 10 mEq IV x 2.   Oswald Hillock, PharmD, BCPS 07/24/2022,8:23 AM

## 2022-07-25 ENCOUNTER — Other Ambulatory Visit: Payer: Self-pay

## 2022-07-25 ENCOUNTER — Encounter: Payer: Self-pay | Admitting: Surgery

## 2022-07-25 ENCOUNTER — Inpatient Hospital Stay: Payer: Medicare Other

## 2022-07-25 DIAGNOSIS — Z515 Encounter for palliative care: Secondary | ICD-10-CM

## 2022-07-25 DIAGNOSIS — I4891 Unspecified atrial fibrillation: Secondary | ICD-10-CM

## 2022-07-25 DIAGNOSIS — Z7189 Other specified counseling: Secondary | ICD-10-CM

## 2022-07-25 DIAGNOSIS — R570 Cardiogenic shock: Secondary | ICD-10-CM | POA: Diagnosis not present

## 2022-07-25 DIAGNOSIS — K5792 Diverticulitis of intestine, part unspecified, without perforation or abscess without bleeding: Secondary | ICD-10-CM | POA: Diagnosis not present

## 2022-07-25 LAB — CBC
HCT: 18.9 % — ABNORMAL LOW (ref 36.0–46.0)
HCT: 21 % — ABNORMAL LOW (ref 36.0–46.0)
Hemoglobin: 5.8 g/dL — ABNORMAL LOW (ref 12.0–15.0)
Hemoglobin: 6.4 g/dL — ABNORMAL LOW (ref 12.0–15.0)
MCH: 27.2 pg (ref 26.0–34.0)
MCH: 27.4 pg (ref 26.0–34.0)
MCHC: 30.7 g/dL (ref 30.0–36.0)
MCHC: 30.5 g/dL (ref 30.0–36.0)
MCV: 88.7 fL (ref 80.0–100.0)
MCV: 89.7 fL (ref 80.0–100.0)
Platelets: 192 10*3/uL (ref 150–400)
Platelets: 226 10*3/uL (ref 150–400)
RBC: 2.13 MIL/uL — ABNORMAL LOW (ref 3.87–5.11)
RBC: 2.34 MIL/uL — ABNORMAL LOW (ref 3.87–5.11)
RDW: 17 % — ABNORMAL HIGH (ref 11.5–15.5)
RDW: 16.9 % — ABNORMAL HIGH (ref 11.5–15.5)
WBC: 8 10*3/uL (ref 4.0–10.5)
WBC: 8.6 10*3/uL (ref 4.0–10.5)
nRBC: 0 % (ref 0.0–0.2)
nRBC: 0 % (ref 0.0–0.2)

## 2022-07-25 LAB — COMPREHENSIVE METABOLIC PANEL
ALT: 18 U/L (ref 0–44)
ALT: 19 U/L (ref 0–44)
AST: 32 U/L (ref 15–41)
AST: 34 U/L (ref 15–41)
Albumin: 2.4 g/dL — ABNORMAL LOW (ref 3.5–5.0)
Albumin: 2.8 g/dL — ABNORMAL LOW (ref 3.5–5.0)
Alkaline Phosphatase: 36 U/L — ABNORMAL LOW (ref 38–126)
Alkaline Phosphatase: 38 U/L (ref 38–126)
Anion gap: 7 (ref 5–15)
Anion gap: 5 (ref 5–15)
BUN: 18 mg/dL (ref 8–23)
BUN: 21 mg/dL (ref 8–23)
CO2: 26 mmol/L (ref 22–32)
CO2: 27 mmol/L (ref 22–32)
Calcium: 6.4 mg/dL — CL (ref 8.9–10.3)
Calcium: 7.2 mg/dL — ABNORMAL LOW (ref 8.9–10.3)
Chloride: 110 mmol/L (ref 98–111)
Chloride: 107 mmol/L (ref 98–111)
Creatinine, Ser: 0.41 mg/dL — ABNORMAL LOW (ref 0.44–1.00)
Creatinine, Ser: 0.39 mg/dL — ABNORMAL LOW (ref 0.44–1.00)
GFR, Estimated: >60 mL/min (ref >60)
GFR, Estimated: >60 mL/min (ref >60)
Glucose, Bld: 98 mg/dL (ref 70–99)
Glucose, Bld: 106 mg/dL — ABNORMAL HIGH (ref 70–99)
Potassium: 3.5 mmol/L (ref 3.5–5.1)
Potassium: 4 mmol/L (ref 3.5–5.1)
Sodium: 143 mmol/L (ref 135–145)
Sodium: 139 mmol/L (ref 135–145)
Total Bilirubin: 0.7 mg/dL (ref 0.3–1.2)
Total Bilirubin: 0.8 mg/dL (ref 0.3–1.2)
Total Protein: 4.9 g/dL — ABNORMAL LOW (ref 6.5–8.1)
Total Protein: 5.5 g/dL — ABNORMAL LOW (ref 6.5–8.1)

## 2022-07-25 LAB — GLUCOSE, CAPILLARY
Glucose-Capillary: 103 mg/dL — ABNORMAL HIGH (ref 70–99)
Glucose-Capillary: 137 mg/dL — ABNORMAL HIGH (ref 70–99)
Glucose-Capillary: 191 mg/dL — ABNORMAL HIGH (ref 70–99)
Glucose-Capillary: 227 mg/dL — ABNORMAL HIGH (ref 70–99)
Glucose-Capillary: 148 mg/dL — ABNORMAL HIGH (ref 70–99)
Glucose-Capillary: 149 mg/dL — ABNORMAL HIGH (ref 70–99)

## 2022-07-25 LAB — HEMOGLOBIN AND HEMATOCRIT, BLOOD
HCT: 25.3 % — ABNORMAL LOW (ref 36.0–46.0)
HCT: 25.7 % — ABNORMAL LOW (ref 36.0–46.0)
Hemoglobin: 7.7 g/dL — ABNORMAL LOW (ref 12.0–15.0)
Hemoglobin: 7.9 g/dL — ABNORMAL LOW (ref 12.0–15.0)

## 2022-07-25 LAB — PROCALCITONIN: Procalcitonin: 0.11 ng/mL

## 2022-07-25 LAB — PHOSPHORUS
Phosphorus: <1 mg/dL — CL (ref 2.5–4.6)
Phosphorus: 2 mg/dL — ABNORMAL LOW (ref 2.5–4.6)

## 2022-07-25 LAB — MAGNESIUM
Magnesium: 1.7 mg/dL (ref 1.7–2.4)
Magnesium: 2 mg/dL (ref 1.7–2.4)

## 2022-07-25 LAB — C-REACTIVE PROTEIN: CRP: 10.6 mg/dL — ABNORMAL HIGH (ref <1.0)

## 2022-07-25 LAB — PREPARE RBC (CROSSMATCH)

## 2022-07-25 LAB — TRIGLYCERIDES: Triglycerides: 58 mg/dL (ref <150)

## 2022-07-25 MED ORDER — ORAL CARE MOUTH RINSE
15.0000 mL | OROMUCOSAL | Status: DC
  Administered 2022-07-25 – 2022-08-01 (×24): 15 mL via OROMUCOSAL

## 2022-07-25 MED ORDER — ALPRAZOLAM 0.25 MG PO TABS
0.2500 mg | ORAL_TABLET | Freq: Three times a day (TID) | ORAL | Status: DC | PRN
  Administered 2022-07-25 – 2022-07-26 (×3): 0.25 mg via ORAL
  Filled 2022-07-25 (×3): qty 1

## 2022-07-25 MED ORDER — SODIUM CHLORIDE 0.9% IV SOLUTION: Freq: Once | INTRAVENOUS | Status: DC

## 2022-07-25 MED ORDER — ADULT MULTIVITAMIN W/MINERALS CH
1.0000 | ORAL_TABLET | Freq: Every day | ORAL | Status: DC
Start: 2022-07-26 — End: 2022-08-01
  Administered 2022-07-26 – 2022-08-01 (×6): 1 via ORAL
  Filled 2022-07-25 (×6): qty 1

## 2022-07-25 MED ORDER — DEXTROSE 5 % IV SOLN
15.0000 mmol | Freq: Once | INTRAVENOUS | Status: AC
  Administered 2022-07-25: 15 mmol via INTRAVENOUS
  Filled 2022-07-25: qty 5

## 2022-07-25 MED ORDER — OXYCODONE-ACETAMINOPHEN 5-325 MG PO TABS
1.0000 | ORAL_TABLET | Freq: Four times a day (QID) | ORAL | Status: DC | PRN
  Administered 2022-07-25 – 2022-08-01 (×2): 1 via ORAL
  Filled 2022-07-25 (×2): qty 1

## 2022-07-25 MED ORDER — ORAL CARE MOUTH RINSE: 15.0000 mL | OROMUCOSAL | Status: DC | PRN

## 2022-07-25 NOTE — Progress Notes (Addendum)
Patient was seen evaluated and examined by me and the PA on 07/25/22.  Course of action, evaluation, and management decisions were developed solely by me, but detailed below in the PA's note.  [place] NOTE       Patient ID: Sarah [name] MRN: [id number] DOB/AGE: [birth year] 76 y.o.  Admit date: 07/19/2022 Referring Physician Donell Beers, NP  Primary Physician Dr. Sabra Heck Primary Cardiologist Dr. Saralyn Pilar  Reason for Consultation elevated troponin & abnormal EKG, concern for perioperative NSTEMI  HPI: Sarah [name] is a 61yoF with a PMH of hypertension, Graves' disease s/p XRT, type 2 diabetes, IBS, diverticulitis with abscess formation s/p CT-guided drain placement in early June 2023 who presented to [facility] for scheduled laparoscopic sigmoidectomy on 07/19/2022 that was ultimately converted to an open Hartman's procedure, now s/p colostomy. The evening following the procedure she reportedly became hypotensive and tachycardic with SBP in the 90s.  CCM was consulted and the patient was transferred to the ICU early morning of 8/23. Cardiology is consulted the afternoon of 8/23 due to concern for an abnormal EKG and elevated troponin which peaked at 13,300 on 8/24.  Echo shows a newly reduced ejection fraction to 45-50%, consistent with perioperative NSTEMI.  She also developed atrial fibrillation with RVR on 8/24.  Interval History: -On BiPAP this morning, no complaints, no chest pain or shortness of breath.  -Significant drop in hemoglobin overnight from 7.4 down to 5.8 -in sinus rhythm on tele, BP ok   Review of systems complete and found to be negative unless listed above     Past Medical History:  Diagnosis Date   Arthritis    hands   Cancer ([place])    thyroid- radiation   Diabetes mellitus without complication ([place])    Hyperlipidemia    Hypertension    Hypothyroidism    IBS (irritable bowel syndrome)     Past Surgical History:  Procedure Laterality Date    ABDOMINAL HYSTERECTOMY     CATARACT EXTRACTION W/PHACO Left 12/21/2016   Procedure: CATARACT EXTRACTION PHACO AND INTRAOCULAR LENS PLACEMENT ([place])  left eye;  Surgeon: Leandrew Koyanagi, MD;  Location: [place];  Service: Ophthalmology;  Laterality: Left;  Diabetic - oral meds   CATARACT EXTRACTION W/PHACO Right 01/25/2017   Procedure: CATARACT EXTRACTION PHACO AND INTRAOCULAR LENS PLACEMENT ([place])  right diabetic;  Surgeon: Leandrew Koyanagi, MD;  Location: [place];  Service: Ophthalmology;  Laterality: Right;  diabetic - oral meds   CHOLECYSTECTOMY     COLONOSCOPY WITH PROPOFOL N/A 10/26/2016   Procedure: COLONOSCOPY WITH PROPOFOL;  Surgeon: Manya Silvas, MD;  Location: [facility] ENDOSCOPY;  Service: Endoscopy;  Laterality: N/A;   IR RADIOLOGIST EVAL & MGMT  06/14/2022   KNEE SURGERY Right     Medications Prior to Admission  Medication Sig Dispense Refill Last Dose   cyanocobalamin 1000 MCG tablet Take 1,000 mcg by mouth daily.   07/18/2022   glimepiride (AMARYL) 4 MG tablet Take 4 mg by mouth daily with breakfast.   07/18/2022   levothyroxine (SYNTHROID) 150 MCG tablet Take 150 mcg by mouth daily before breakfast.   07/19/2022 at 0730   metFORMIN (GLUCOPHAGE) 500 MG tablet Take 500 mg by mouth 2 (two) times daily with a meal.      metoprolol succinate (TOPROL-XL) 50 MG 24 hr tablet Take 50 mg by mouth daily. Take with or immediately following a meal.   07/19/2022 at 0730   Olmesartan-Amlodipine-HCTZ 40-10-25 MG TABS Take 1 tablet by mouth  3 (three) times a week. Mon, Wed, Fri   07/19/2022 at 0730   ondansetron (ZOFRAN-ODT) 4 MG disintegrating tablet Take 1 tablet (4 mg total) by mouth every 8 (eight) hours as needed for nausea or vomiting. 12 tablet 0 07/19/2022 at 0200   potassium chloride (K-DUR) 10 MEQ tablet Take 10 mEq by mouth daily.   07/18/2022   sertraline (ZOLOFT) 100 MG tablet Take 100 mg by mouth daily.      simvastatin (ZOCOR) 20 MG tablet Take 20 mg by mouth  daily.   Past Week   venlafaxine (EFFEXOR) 37.5 MG tablet Take 37.5 mg by mouth daily.   07/19/2022 at 0730   sodium chloride flush (NS) 0.9 % SOLN 5 mLs by Intracatheter route every 8 (eight) hours. 1350 mL 0    Social History   Socioeconomic History   Marital status: Married    Spouse name: Not on file   Number of children: Not on file   Years of education: Not on file   Highest education level: Not on file  Occupational History   Not on file  Tobacco Use   Smoking status: Never   Smokeless tobacco: Never   Tobacco comments:    social as teenager  Vaping Use   Vaping Use: Never used  Substance and Sexual Activity   Alcohol use: No   Drug use: No   Sexual activity: Not on file  Other Topics Concern   Not on file  Social History Narrative   Not on file   Social Determinants of Health   Financial Resource Strain: Not on file  Food Insecurity: Not on file  Transportation Needs: Not on file  Physical Activity: Not on file  Stress: Not on file  Social Connections: Not on file  Intimate Partner Violence: Not on file    Family History  Problem Relation Age of Onset   Breast cancer Neg Hx       PHYSICAL EXAM General: Elderly and frail-appearing Caucasian female, in no acute distress, sitting in incline in ICU bed. HEENT:  Normocephalic and atraumatic. Neck:  No JVD.  Lungs: Somewhat short of breath appearing on BiPAP, coarse breath sounds to auscultation, trace wheezes, poor air movement  Heart: Regular rate and rhythm, normal S1 and S2 without gallops or murmurs.  Abdomen: Non-distended appearing.  Vertical midline incision covered with dry padded dressing, ostomy present LLQ with good output Msk: Normal strength and tone for age. Extremities: Warm and well perfused. No clubbing, cyanosis.  SCDs in place to bilateral lower legs without significant pedal edema.   Neuro: Alert and oriented X 3. Psych:  Answers questions appropriately.   Labs: Basic Metabolic  Panel: Recent Labs    07/25/22 0310 07/25/22 0344  NA 143 139  K 3.5 4.0  CL 110 107  CO2 26 27  GLUCOSE 98 106*  BUN 18 21  CREATININE 0.41* 0.39*  CALCIUM 6.4* 7.2*  MG 1.7 2.0  PHOS <1.0* 2.0*    Liver Function Tests: Recent Labs    07/25/22 0310 07/25/22 0344  AST 32 34  ALT 18 19  ALKPHOS 36* 38  BILITOT 0.7 0.8  PROT 4.9* 5.5*  ALBUMIN 2.4* 2.8*    No results for input(s): "LIPASE", "AMYLASE" in the last 72 hours. CBC: Recent Labs    07/25/22 0310 07/25/22 0332  WBC 8.0 8.6  HGB 5.8* 6.4*  HCT 18.9* 21.0*  MCV 88.7 89.7  PLT 192 226    Cardiac Enzymes: No results for  input(s): "CKTOTAL", "CKMB", "CKMBINDEX", "TROPONINIHS" in the last 72 hours.  BNP: Invalid input(s): "POCBNP" D-Dimer: No results for input(s): "DDIMER" in the last 72 hours. Hemoglobin A1C: No results for input(s): "HGBA1C" in the last 72 hours. Fasting Lipid Panel: Recent Labs    07/25/22 0310  TRIG 58    Thyroid Function Tests: No results for input(s): "TSH", "T4TOTAL", "T3FREE", "THYROIDAB" in the last 72 hours.  Invalid input(s): "FREET3" Anemia Panel: Recent Labs    07/23/22 0244 07/23/22 0820  VITAMINB12  --  1,446*  FERRITIN 191  --   TIBC 134*  --   IRON 33  --     DG Chest Port 1 View  Result Date: 07/25/2022 CLINICAL DATA:  Acute respiratory failure EXAM: PORTABLE CHEST 1 VIEW COMPARISON:  07/24/2022 FINDINGS: Cardiac shadow is enlarged but stable. Right PICC is seen deep in the right atrium but stable. Patchy airspace opacities are again identified bilaterally but mildly improved when compared with the prior study. No bony abnormality is noted. IMPRESSION: Slight improved aeration when compared with the previous exam. Electronically Signed   By: Inez Catalina M.D.   On: 07/25/2022 04:10   DG Chest Port 1 View  Result Date: 07/24/2022 CLINICAL DATA:  Acute respiratory failure EXAM: PORTABLE CHEST 1 VIEW COMPARISON:  July 23, 2022 FINDINGS: A right PICC  line terminates near the caval atrial junction. The cardiomediastinal silhouette is stable. Bilateral pulmonary opacities, particularly in the perihilar regions, have worsened. Probable skin fold over the lateral right lung with lung markings on both sides. No convincing evidence of pneumothorax. Decreased lung volumes. No other acute abnormalities. IMPRESSION: 1. The linear stripe over the lateral right mid chest is likely a skin fold as there are lung markings on both sides. No convincing evidence of pneumothorax. Recommend attention on follow-up. 2. Stable right PICC line. 3. Increasing bilateral perihilar opacities. While pulmonary edema is possible, the findings could represent a multifocal infectious process/pneumonia. Recommend clinical correlation and attention on follow-up. Electronically Signed   By: Dorise Bullion III M.D.   On: 07/24/2022 07:52     Radiology: [facility] Chest Port 1 View  Result Date: 07/25/2022 CLINICAL DATA:  Acute respiratory failure EXAM: PORTABLE CHEST 1 VIEW COMPARISON:  07/24/2022 FINDINGS: Cardiac shadow is enlarged but stable. Right PICC is seen deep in the right atrium but stable. Patchy airspace opacities are again identified bilaterally but mildly improved when compared with the prior study. No bony abnormality is noted. IMPRESSION: Slight improved aeration when compared with the previous exam. Electronically Signed   By: Inez Catalina M.D.   On: 07/25/2022 04:10   DG Chest Port 1 View  Result Date: 07/24/2022 CLINICAL DATA:  Acute respiratory failure EXAM: PORTABLE CHEST 1 VIEW COMPARISON:  July 23, 2022 FINDINGS: A right PICC line terminates near the caval atrial junction. The cardiomediastinal silhouette is stable. Bilateral pulmonary opacities, particularly in the perihilar regions, have worsened. Probable skin fold over the lateral right lung with lung markings on both sides. No convincing evidence of pneumothorax. Decreased lung volumes. No other acute abnormalities.  IMPRESSION: 1. The linear stripe over the lateral right mid chest is likely a skin fold as there are lung markings on both sides. No convincing evidence of pneumothorax. Recommend attention on follow-up. 2. Stable right PICC line. 3. Increasing bilateral perihilar opacities. While pulmonary edema is possible, the findings could represent a multifocal infectious process/pneumonia. Recommend clinical correlation and attention on follow-up. Electronically Signed   By: Dorise Bullion III M.D.  On: 07/24/2022 07:52   DG Chest Port 1 View  Result Date: 07/23/2022 CLINICAL DATA:  Hypoxic respiratory failure. EXAM: PORTABLE CHEST 1 VIEW COMPARISON:  Portable chest yesterday at 8:27 a.m. FINDINGS: 4:46 a.m. Right PICC tip remains in the upper right atrium. Moderate cardiomegaly is stable. Today there is increased perihilar vascular congestion and central interstitial edema. There are stable small pleural effusions and overlying consolidation or atelectasis, denser on the left. There is increased perihilar opacity on the right which could be atelectasis, infiltrate or ground-glass edema. The more cephalad lungs remain clear. In all other respects no further changes. There is no pneumothorax.  Aortic atherosclerosis. IMPRESSION: 1. Increased perihilar vascular congestion and mild central interstitial edema. 2. Similar layering pleural effusions and overlying lung opacities. 3. Increased right mid perihilar opacity. Electronically Signed   By: Telford Nab M.D.   On: 07/23/2022 06:21   DG Chest Port 1 View  Result Date: 07/22/2022 CLINICAL DATA:  Status post sigmoid colectomy. EXAM: PORTABLE CHEST 1 VIEW COMPARISON:  July 21, 2022. FINDINGS: Stable cardiomegaly. Interval placement of right-sided PICC line with distal tip in expected position of cavoatrial junction. Mild bibasilar subsegmental atelectasis is noted. Bony thorax is unremarkable. IMPRESSION: Interval placement of right-sided PICC line. Mild bibasilar  subsegmental atelectasis is noted with probable minimal pleural effusions. Electronically Signed   By: Marijo Conception M.D.   On: 07/22/2022 08:47   [place] EKG SITE RITE  Result Date: 07/21/2022 If Site Rite image not attached, placement could not be confirmed due to current cardiac rhythm.  DG Abd 1 View  Result Date: 07/21/2022 CLINICAL DATA:  [id number].  Abdominal pain EXAM: ABDOMEN - 1 VIEW COMPARISON:  06/21/2015 FINDINGS: Multiple surgical skin staples overlie the lower abdomen. Normal abdominal gas pattern. No gross free intraperitoneal gas. Cholecystectomy clips are seen in the right upper quadrant. Surgical drain overlies the mid pelvis. No organomegaly. Osseous structures are age-appropriate. IMPRESSION: Normal abdominal gas pattern. Electronically Signed   By: Fidela Salisbury M.D.   On: 07/21/2022 00:36   DG Chest 1 View  Result Date: 07/21/2022 CLINICAL DATA:  Abdominal pain.  [id number] EXAM: CHEST  1 VIEW COMPARISON:  02/23/2021 FINDINGS: Lungs volumes are small, but are symmetric and are clear. No pneumothorax or pleural effusion. Cardiac size within normal limits. Pulmonary vascularity is normal. Osseous structures are age-appropriate. No acute bone abnormality. IMPRESSION: No active disease. Electronically Signed   By: Fidela Salisbury M.D.   On: 07/21/2022 00:35   ECHOCARDIOGRAM COMPLETE  Result Date: 07/20/2022    ECHOCARDIOGRAM REPORT   Patient Name:   LANIE SCHELLING Date of Exam: 07/20/2022 Medical Rec #:  [id number]   Height:       63.0 in Accession #:    [id number]  Weight:       122.0 lb Date of Birth:  08-31-1946   BSA:          1.567 m Patient Age:    74 years    BP:           105/73 mmHg Patient Gender: F           HR:           126 bpm. Exam Location:  [facility] Procedure: 2D Echo, Color Doppler and Cardiac Doppler Indications:     I21.4 NSTEMI  History:         Patient has no prior history of Echocardiogram examinations.  Arrythmias:Tachycardia; Risk Factors:Hypertension,  Diabetes and                  Dyslipidemia.  Sonographer:     Charmayne Sheer Referring Phys:  [id number] [place] TANG Diagnosing Phys: Yolonda Kida MD  Sonographer Comments: No subcostal window. IMPRESSIONS  1. Left ventricular ejection fraction, by estimation, is 45 to 50%. The left ventricle has mildly decreased function. The left ventricle demonstrates global hypokinesis. The left ventricular internal cavity size was mildly to moderately dilated. Left ventricular diastolic parameters were normal.  2. Right ventricular systolic function is normal. The right ventricular size is normal.  3. The mitral valve is normal in structure. No evidence of mitral valve regurgitation.  4. The aortic valve is normal in structure. Aortic valve regurgitation is not visualized. FINDINGS  Left Ventricle: Left ventricular ejection fraction, by estimation, is 45 to 50%. The left ventricle has mildly decreased function. The left ventricle demonstrates global hypokinesis. The left ventricular internal cavity size was mildly to moderately dilated. There is borderline left ventricular hypertrophy. Left ventricular diastolic parameters were normal. Right Ventricle: The right ventricular size is normal. No increase in right ventricular wall thickness. Right ventricular systolic function is normal. Left Atrium: Left atrial size was normal in size. Right Atrium: Right atrial size was normal in size. Pericardium: There is no evidence of pericardial effusion. Mitral Valve: The mitral valve is normal in structure. No evidence of mitral valve regurgitation. Tricuspid Valve: The tricuspid valve is normal in structure. Tricuspid valve regurgitation is trivial. Aortic Valve: The aortic valve is normal in structure. Aortic valve regurgitation is not visualized. Aortic valve mean gradient measures 5.0 mmHg. Aortic valve peak gradient measures 9.0 mmHg. Aortic valve area, by VTI measures 2.19 cm. Pulmonic Valve: The pulmonic valve was normal in  structure. Pulmonic valve regurgitation is not visualized. Aorta: The ascending aorta was not well visualized. IAS/Shunts: No atrial level shunt detected by color flow Doppler.  LEFT VENTRICLE PLAX 2D LVIDd:         5.24 cm   Diastology LVIDs:         3.95 cm   LV e' medial:    4.79 cm/s LV PW:         1.53 cm   LV E/e' medial:  17.3 LV IVS:        0.93 cm   LV e' lateral:   7.51 cm/s LVOT diam:     2.00 cm   LV E/e' lateral: 11.1 LV SV:         51 LV SV Index:   32 LVOT Area:     3.14 cm  RIGHT VENTRICLE RV Basal diam:  2.79 cm RV S prime:     26.90 cm/s LEFT ATRIUM           Index        RIGHT ATRIUM           Index LA diam:      3.80 cm 2.42 cm/m   RA Area:     15.10 cm LA Vol (A2C): 33.8 ml 21.57 ml/m  RA Volume:   38.80 ml  24.76 ml/m  AORTIC VALVE AV Area (Vmax):    1.94 cm AV Area (Vmean):   1.77 cm AV Area (VTI):     2.19 cm AV Vmax:           150.00 cm/s AV Vmean:          110.000 cm/s AV VTI:  0.232 m AV Peak Grad:      9.0 mmHg AV Mean Grad:      5.0 mmHg LVOT Vmax:         92.70 cm/s LVOT Vmean:        61.900 cm/s LVOT VTI:          0.162 m LVOT/AV VTI ratio: 0.70  AORTA Ao Root diam: 3.10 cm MITRAL VALVE MV Area (PHT): 10.25 cm   SHUNTS MV Decel Time: 74 msec     Systemic VTI:  0.16 m MV E velocity: 83.05 cm/s  Systemic Diam: 2.00 cm MV A velocity: 78.65 cm/s MV E/A ratio:  1.06 Maricella Filyaw D Jermany Sundell MD Electronically signed by Yolonda Kida MD Signature Date/Time: 07/20/2022/5:44:47 PM    Final     ECHO (stress echo) 02/14/2017  Ref Range & Units 5 yr ago  LV Ejection Fraction (%)  55   Aortic Valve Regurgitation Grade  none   Aortic Valve Stenosis Grade  none   Mitral Valve Regurgitation Grade  trivial   Mitral Valve Stenosis Grade  none   Tricuspid Valve Regurgitation Grade  trivial   Tricuspid Valve Regurgitation Max Velocity (m/s) m/sec 1.4   Right Ventricle Systolic Pressure (mmHg) mmHg 12.4   Narrative                     CARDIOLOGY DEPARTMENT                   [place],  [place]                      [id number]                    [facility]                 Acct #: [id number]          [address], [place], [place] [address]       Date: 02/14/2017 02:57 PM                                                             Adult Female Age: 74 yrs                     ECHOCARDIOGRAM REPORT                   Outpatient                                                             [id number]            STUDY:Stress Echo        TAPE:                   MD1:  DRANE, ANNA MARIA             ECHO:Yes  DOPPLER:Yes  FILE:0000-000-000       BP: 148/83 mmHg            COLOR:Yes  CONTRAST:No      MACHINE:[place]      Height: 63 in        RV BIOPSY:No         3D:No   SOUND QLTY:Moderate     Weight: 174 lb           MEDIUM:None                                       BSA: 1.8 m2   ___________________________________________________________________________________________     HISTORY:Chest pain      REASON:Assess, LV function  Indication:R07.89 Other chest pain, I10 Essential (primary) hypertension, R06.02             Shortness of breath  ___________________________________________________________________________________________   STRESS ECHOCARDIOGRAPHY            Protocol:Treadmill ([place])              Drugs:None  Target Heart Rate:128 bpm        Maximum Predicted Heart Rate: 150 bpm   +-------------------+-------------------------+-------------------------+------------+         Stage            Duration (mm:ss)         Heart Rate (bpm)         BP       +-------------------+-------------------------+-------------------------+------------+        RESTING                                           85              148/83     +-------------------+-------------------------+-------------------------+------------+        EXERCISE                4:49                     210                /         +-------------------+-------------------------+-------------------------+------------+        RECOVERY                8:05                      94              176/88     +-------------------+-------------------------+-------------------------+------------+     Stress Duration:4:49 mm:ss    Max Stress H.R.:210 bpm        Target Heart Rate Achieved: Yes   ___________________________________________________________________________________________   WALL SEGMENT CHANGES                         Rest          Stress  Anterior Septum Basal:Normal        Hyperkinetic                    IOE:VOJJKK        Hyperkinetic                 Apical:Normal  Hyperkinetic     Anterior Wall Basal:Normal        Hyperkinetic                    ZOX:WRUEAV        Hyperkinetic                 Apical:Normal        Hyperkinetic      Lateral Wall Basal:Normal        Hyperkinetic                    WUJ:[id number]        Hyperkinetic                 Apical:Normal        Hyperkinetic    Posterior Wall Basal:Normal        Hyperkinetic                    NWG:NFAOZH        Hyperkinetic     Inferior Wall Basal:Normal        Hyperkinetic                    YQM:VHQION        Hyperkinetic                 Apical:Normal        Hyperkinetic   Inferior Septum Basal:Normal        Hyperkinetic                    GEX:BMWUXL        Hyperkinetic              Resting EF:>55% (Est.)   Stress EF: >55% (Est.)   ___________________________________________________________________________________________   ADDITIONAL FINDINGS    ___________________________________________________________________________________________   STRESS ECG RESULTS                                                ECG Results:Normal   ___________________________________________________________________________________________  ECHOCARDIOGRAPHIC DESCRIPTIONS   LEFT VENTRICLE         Size:Normal  Contraction:Normal    LV Masses:No Masses           KGM:WNUU   RIGHT VENTRICLE         Size:Normal                Free Wall:Normal  Contraction:Normal                RV Masses:No mass   PERICARDIUM        Fluid:No effusion    _______________________________________________________________________________________  DOPPLER ECHO and OTHER SPECIAL PROCEDURES     Aortic:No AR                      No AS      Mitral:TRIVIAL MR                 No MS            MV Inflow E Vel=nm*        MV Annulus E'Vel=nm*            E/E'Ratio=nm*   Tricuspid:TRIVIAL TR                 No TS  136.0 cm/sec peak TR vel   12.4 mmHg peak RV pressure   Pulmonary:No PR                      No PS     ___________________________________________________________________________________________  ECHOCARDIOGRAPHIC MEASUREMENTS  2D DIMENSIONS  AORTA             Values      Normal Range      MAIN PA          Values      Normal Range            Annulus:  nm*       [2.1 - 2.5]                PA Main:  nm*       [1.5 - 2.1]          Aorta Sin:  nm*       [2.7 - 3.3]       RIGHT VENTRICLE        ST Junction:  nm*       [2.3 - 2.9]                RV Base:  nm*       [ < 4.2]          Asc.Aorta:  nm*       [2.3 - 3.1]                 RV Mid:  nm*       [ < 3.5]   LEFT VENTRICLE                                        RV Length:  nm*       [ < 8.6]              LVIDd:  nm*       [3.9 - 5.3]       INFERIOR VENA CAVA              LVIDs:  nm*                                 Max. IVC:  nm*       [ <= 2.1]                 FS:  nm*       [> 25]                    Min. IVC:  nm*                SWT:  nm*       [0.5 - 0.9]                   ------------------                PWT:  nm*       [0.5 - 0.9]                   nm* - not measured   LEFT ATRIUM            LA Diam:  nm*       [2.7 - 3.8]  LA A4C Area:  nm*       [ < 20]          LA Volume:  nm*       [22 - 52]     ___________________________________________________________________________________________  INTERPRETATION  Normal Stress Echocardiogram  NORMAL RIGHT VENTRICULAR SYSTOLIC FUNCTION  TRIVIAL REGURGITATION NOTED (See above)  NO VALVULAR STENOSIS NOTED    ___________________________________________________________________________________________  Electronically signed by: MD Miquel Dunn on 02/15/2017 10:07 AM              Performed By: Johnathan Hausen, RDCS, RVT        Ordering Physician: Clabe Seal   TELEMETRY reviewed by me (LT) 07/25/2022 : Sinus rhythm rate 80s to 90s   EKG reviewed by me and Dr. Saralyn Pilar: 8/23 sinus tachycardia with rate 118 nonspecific T wave changes, repeat at 12:00 without significant changes. 1700 on 8/23, AF RVR with nonspecific ST changes but no stemi per Dr. Saralyn Pilar  Data reviewed by me (LT) 07/25/2022: Critical care NP note, surgery note, CBC, BMP, chest x-ray telemetry, procalcitonin  ASSESSMENT AND PLAN:  Sarah [name] is a 69yoF with a PMH of hypertension, Graves' disease s/p XRT, type 2 diabetes, IBS, diverticulitis with abscess formation s/p CT-guided drain placement in early June 2023 who presented to [facility] for scheduled laparoscopic sigmoidectomy on 07/19/2022 that was ultimately converted to an open Hartman's procedure, now s/p colostomy. The evening following the procedure she reportedly became hypotensive and tachycardic with SBP in the 90s.  CCM was consulted and the patient was transferred to the ICU early morning of 8/23. Cardiology is consulted the afternoon of 8/23 due to concern for an abnormal EKG and elevated troponin which peaked at 13,300 on 8/24.  Echo shows a newly reduced ejection fraction to 45-50%, consistent with perioperative NSTEMI.  She also developed atrial fibrillation with RVR on 8/24.  #Sepsis 2/2 suspected intra-abdominal infection vs UTI #Diverticulitis s/p robotic sigmoidectomy converted to open Hartman's procedure on  07/19/22 #acute hypoxic respiratory failure Agree with current therapy per primary team & surgery  #perioperative NSTEMI Underwent scheduled robotic sigmoidectomy for diverticulitis on 8/22 with Dr. Lysle Pearl, the surgery ultimately was converted to an open Hartman's procedure and she is now s/p colostomy.  Overnight on the floor she became hypotensive and tachycardic, and was transferred to the ICU for closer monitoring.  Ultimately she did not require vasopressor support and blood pressures been stable throughout the day.  A troponin was checked which was significantly elevated at 7000, and repeat at 9500.  EKGs read as acute MI by the computer, confirmed with Dr. Clayborn Bigness not to be a STEMI, but with nonspecific T wave changes and poor R wave progression, overall not significantly changed from prior in July 2023. -Concern for perioperative NSTEMI with troponin peak at 13,300 and newly reduced ejection fraction & LV global hypokinesis -S/p 325 mg aspirin, continue 81 mg aspirin daily  -hold heparin GTT with overnight drop in H/H from 7.4-5.8 -S/p Neo-Synephrine -continue metoprolol tartrate '25mg'$  BID  -Hold home antihypertensives irbesartan 150 mg, amlodipine 10 mg and HCTZ 12.'5mg'$  -Continue atorvastatin 40 mg once daily -Monitor and replete electrolytes for a magnesium >2, potassium >4 -Continuous monitoring on telemetry while inpatient -ongoing consideration for further ischemic work-up pending clinical course, but continue to favor conservative management from a cardiac standpoint, per Dr. 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 BNP elevated at 1600, increasing to 3100. Somewhat edematous appearing in her upper extremities. -s/p IV lasix '20mg'$  x 2, '40mg'$  x 6 doses.  - continue lasix '40mg'$  IV BID for now  #New onset  paroxysmal atrial fibrillation with RVR #hypothyroidism (History of Graves' disease s/p XRT) Developed the evening of 8/23, heart rates in the 160s, s/p amiodarone bolus x2.  -S/p amiodarone gtt.  -continue p.o. amiodarone 200 mg twice daily x 4 days, then 200 mg daily thereafter starting 8/29. -stop heparin GTT with worsening anemia requiring transfusion CHA2DS2-VASc 6 (age, sex, chf, htn, MI) -thyroid panel: Slightly low TSH at 0.368, T4 within normal limits 4.9, T3 uptake slightly elevated at 43%  This patient's plan of care was discussed and created with Dr. Saralyn Pilar and he is in agreement.  Signed: Tristan Schroeder , PA-C 07/25/2022, 8:05 AM [facility]

## 2022-07-25 NOTE — Progress Notes (Signed)
Nutrition Follow Up Note   DOCUMENTATION CODES:   Severe malnutrition in context of acute illness/injury  INTERVENTION:   Ensure Enlive po TID, each supplement provides 350 kcal and 20 grams of protein.  MVI po daily   Pt at high refeed risk; recommend monitor potassium, magnesium and phosphorus labs daily until stable  Daily weights   NUTRITION DIAGNOSIS:   Severe Malnutrition related to acute illness as evidenced by 21 percent weight loss in 3 months, severe fat depletion, severe muscle depletion.  GOAL:   Patient will meet greater than or equal to 90% of their needs -not met   MONITOR:   PO intake, Supplement acceptance, Labs, Weight trends, Skin, I & O's  ASSESSMENT:   76 y/o female with a PMH of hypertension, Graves' disease s/p XRT, hypothyroidism, type 2 DM, IBS, HTN, HLD and diverticulitis with abscess formation s/p IR drain 7/18, now s/p Hartmann's procedure 7/12 complicated by sepsis, AKI, NSTEMI and new Afib.  TPN discontinued over the weekend r/t concerns for volume overload. Pt advanced to a full liquid diet on 8/26. Pt with fair appetite and oral intake and is drinking Ensure supplements; however, pt currently requiring bipap and has not been able to eat. Pt continues to refeed; electrolytes being supplemented. Per chart, pt is up ~30lbs from her admit weight. Pt +5.0L on her I & Os. Will monitor nutritional status. Pt may require NGT and nutrition support if she continues to require bipap.   Medications reviewed and include: aspirin, lasix, insulin, solu-medrol, protonix, meropenem   Labs reviewed: K 4.0 wnl, creat 0.39(L), P 2.0(L), Mg 2.0 wnl BNP- 3130- 8/27 Hgb 7.7(L), Hct 25.3(L) Cbgs-  191, 137, 103 x 24hrs  Drain- 26m  Diet Order:   Diet Order             Diet full liquid Room service appropriate? Yes; Fluid consistency: Thin  Diet effective now                  EDUCATION NEEDS:   Education needs have been addressed  Skin:  Skin  Assessment: Reviewed RN Assessment (incision abdomen)  Last BM:  8/28- type 6  Height:   Ht Readings from Last 1 Encounters:  07/20/22 5' 2.99" (1.6 m)    Weight:   Wt Readings from Last 1 Encounters:  07/23/22 70.3 kg    Ideal Body Weight:  52.3 kg  BMI:  Body mass index is 27.46 kg/m.  Estimated Nutritional Needs:   Kcal:  1400-1600kcal/day  Protein:  70-80g/day  Fluid:  1.3-1.5L/day  CKoleen DistanceMS, RD, LDN Please refer to [facility]for RD and/or RD on-call/weekend/after hours pager

## 2022-07-25 NOTE — Progress Notes (Signed)
Subjective:  CC: Sarah [name] is a 76 y.o. female  Hospital stay day 6, 6 Days Post-Op robotic sigmoidectomy converted to open Hartman's procedure  HPI: Worsening respiratory status overnight, but no complaints of abdominal issues.  Currently on BiPAP.  ROS:  General: Denies weight loss, weight gain, fatigue, fevers, chills, and night sweats. Heart: Denies chest pain, palpitations, racing heart, irregular heartbeat, leg pain or swelling, and decreased activity tolerance. Respiratory: Denies cough, and sputum. GI: Denies change in appetite, heartburn, nausea, vomiting, constipation, diarrhea, and blood in stool. GU: Denies difficulty urinating, pain with urinating, urgency, frequency, blood in urine.   Objective:   Temp:  [96.8 F (36 C)-97.4 F (36.3 C)] 96.8 F (36 C) (08/28 1245) Pulse Rate:  [78-107] 94 (08/28 1520) Resp:  [14-36] 23 (08/28 1520) BP: (82-131)/(52-79) 127/68 (08/28 1500) SpO2:  [47 %-100 %] 88 % (08/28 1520) FiO2 (%):  [50 %-100 %] 70 % (08/28 1520)     Height: 5' 2.99" (160 cm) Weight: 70.3 kg BMI (Calculated): 27.46   Intake/Output this shift:   Intake/Output Summary (Last 24 hours) at 07/25/2022 1548 Last data filed at 07/25/2022 1500 Gross per 24 hour  Intake 1513.33 ml  Output 1745 ml  Net -231.67 ml    Constitutional :  alert, cooperative, appears stated age, and no distress  Respiratory:  clear to auscultation bilaterally  Cardiovascular:  Tachycardic  Gastrointestinal: Soft, no guarding, minima TTP . Ostomy pink, patent, stool in bag  Skin: Cool and moist. Incisions c/d/i  Psychiatric: Normal affect, non-agitated, not confused       LABS:     Latest Ref Rng & Units 07/25/2022    3:44 AM 07/25/2022    3:10 AM 07/24/2022    1:09 AM  CMP  Glucose 70 - 99 mg/dL 106  98  116   BUN 8 - 23 mg/dL '21  18  21   '$ Creatinine 0.44 - 1.00 mg/dL 0.39  0.41  0.48   Sodium 135 - 145 mmol/L 139  143  139   Potassium 3.5 - 5.1 mmol/L 4.0  3.5  3.3   Chloride  98 - 111 mmol/L 107  110  108   CO2 22 - 32 mmol/L '27  26  26   '$ Calcium 8.9 - 10.3 mg/dL 7.2  6.4  7.1   Total Protein 6.5 - 8.1 g/dL 5.5  4.9    Total Bilirubin 0.3 - 1.2 mg/dL 0.8  0.7    Alkaline Phos 38 - 126 U/L 38  36    AST 15 - 41 U/L 34  32    ALT 0 - 44 U/L 19  18        Latest Ref Rng & Units 07/25/2022   11:20 AM 07/25/2022    3:32 AM 07/25/2022    3:10 AM  CBC  WBC 4.0 - 10.5 K/uL  8.6  8.0   Hemoglobin 12.0 - 15.0 g/dL 7.7  6.4  5.8   Hematocrit 36.0 - 46.0 % 25.3  21.0  18.9   Platelets 150 - 400 K/uL  226  192     RADS: N/a Assessment:   S/p robotic sigmoidectomy converted to open Hartman's procedure for complicated diverticulitis.  GI exam improving with consistent output from ostomy since post op.  Currently on full liquid diet and ensure.  Stay on full for now due to worsening respiratory status.  Will adjust as needed   Continue merrem for full course to cover for intra-abdominal infection.  Appreciate cards and ICU support in the meantime for likely post op sepsis, NSTEMI, respiratory distress.  We did discuss her new path results showing invasive colon CA.  Will discuss further care from oncology standpoint once respiratory status improves   labs/images/medications/previous chart entries reviewed personally and relevant changes/updates noted above.

## 2022-07-25 NOTE — Consult Note (Signed)
Consultation Note Date: 07/25/2022   Patient Name: Sarah [name]  DOB: [birth year]  MRN: [id number]  Age / Sex: 76 y.o., female  PCP: Rusty Aus, MD Referring Physician: Flora Lipps, MD  Reason for Consultation: Establishing goals of care  HPI/Patient Profile: 76 y.o. female  with past medical history of Graves' disease status post x-ray therapy, DM 2, HTN/HLD, IBS, diverticulitis with abscess formation status post CT-guided drain placement early June 2023, scheduled for laparoscopic sigmoidectomy 07/19/2022 ultimately converted to an open Hartman's procedure now status post colostomy, postop hypotensive/tachycardiac with blood pressure in the 90s, perioperative NSTEMI admitted on 07/19/2022 with perioperative NSTEMI with A-fib with RVR, respiratory failure.   Clinical Assessment and Goals of Care: I have reviewed medical records including EPIC notes, labs and imaging, received report from RN, assessed the patient.  Mrs. Lahman is sitting up in the bed in her room.  She greets me, making and mostly keeping eye contact.  She appears acutely ill.  Somewhat limited discussion today due to her acute condition and the use of BiPAP.  She is alert and oriented, able to make her basic needs known.  There is no family at bedside at this time.  We meet at bedside to discuss diagnosis prognosis, GOC, EOL wishes, disposition and options.  I introduced Palliative Medicine as specialized medical care for people living with serious illness. It focuses on providing relief from the symptoms and stress of a serious illness. The goal is to improve quality of life for both the patient and the family.  We focused on their current illness.  Somewhat limited interview due to the use of BiPAP.  Overall, she tells me that her belly is stable.  We talk about her breathing, the use of BiPAP, the treatment plan.  The natural disease trajectory and  expectations at EOL were discussed.  Advanced directives, concepts specific to code status, artifical feeding and hydration, and rehospitalization were considered and discussed.  We talk about the concept of "treat the treatable, but allowing natural passing".  At this point Mrs. Trevor remains full scope/full code.  We talk about setting limits on life support, 2 days, 2 weeks?  I encouraged her to consider these choices.  PMT to follow  Discussed the importance of continued conversation with family and the medical providers regarding overall plan of care and treatment options, ensuring decisions are within the context of the patient's values and GOCs.  Questions and concerns were addressed.  The patient was encouraged to call with questions or concerns.  PMT will continue to support holistically.  Conference with attending, bedside nursing staff, transition of care team related to patient condition, needs, goals of care, disposition.   HCPOA NEXT OF KIN -Mrs. Savo's husband, Meliss Fleek has had a stroke and is in a nursing home.  She names her brothers, Charlotte Crumb [place] and Ethelda Chick [place] as her healthcare surrogate.  She tells me she also has a daughter, Jeneen Rinks, who she would like to participate in her goals of care.  SUMMARY OF RECOMMENDATIONS   At this point full scope/full code Time for outcomes   Code Status/Advance Care Planning: Full code  Symptom Management:  Per hospitalist/CCM, no additional needs at this time  Palliative Prophylaxis:  Frequent Pain Assessment and Oral Care  Additional Recommendations (Limitations, Scope, Preferences): Full Scope Treatment  Psycho-social/Spiritual:  Desire for further Chaplaincy support:no Additional Recommendations: Caregiving  Support/Resources  Prognosis:  Unable to determine, based on outcomes.  Guarded at this point.  Discharge Planning: To be determined, based on outcomes.      Primary Diagnoses: Present on Admission:   Diverticulitis   I have reviewed the medical record, interviewed the patient and family, and examined the patient. The following aspects are pertinent.  Past Medical History:  Diagnosis Date   Arthritis    hands   Cancer ([place])    thyroid- radiation   Diabetes mellitus without complication (HCC)    Hyperlipidemia    Hypertension    Hypothyroidism    IBS (irritable bowel syndrome)    Social History   Socioeconomic History   Marital status: Married    Spouse name: Not on file   Number of children: Not on file   Years of education: Not on file   Highest education level: Not on file  Occupational History   Not on file  Tobacco Use   Smoking status: Never   Smokeless tobacco: Never   Tobacco comments:    social as teenager  Vaping Use   Vaping Use: Never used  Substance and Sexual Activity   Alcohol use: No   Drug use: No   Sexual activity: Not on file  Other Topics Concern   Not on file  Social History Narrative   Not on file   Social Determinants of Health   Financial Resource Strain: Not on file  Food Insecurity: Not on file  Transportation Needs: Not on file  Physical Activity: Not on file  Stress: Not on file  Social Connections: Not on file   Family History  Problem Relation Age of Onset   Breast cancer Neg Hx    Scheduled Meds:  sodium chloride   Intravenous Once   amiodarone  200 mg Oral BID   Followed by   Derrill Memo ON 07/26/2022] amiodarone  200 mg Oral Daily   aspirin  81 mg Oral Daily   atorvastatin  40 mg Oral QHS   Chlorhexidine Gluconate Cloth  6 each Topical Daily   feeding supplement  237 mL Oral TID BM   furosemide  40 mg Intravenous Q12H   gabapentin  300 mg Oral BID   insulin aspart  0-20 Units Subcutaneous Q4H   insulin aspart  5 Units Subcutaneous TID WC   levalbuterol  0.63 mg Nebulization Q6H   And   ipratropium  0.5 mg Nebulization Q6H   methylPREDNISolone (SOLU-MEDROL) injection  40 mg Intravenous Q12H   metoprolol tartrate  25  mg Oral BID   mouth rinse  15 mL Mouth Rinse 4 times per day   pantoprazole (PROTONIX) IV  40 mg Intravenous QHS   sertraline  100 mg Oral Daily   sodium chloride flush  10-40 mL Intracatheter Q12H   venlafaxine  37.5 mg Oral Q breakfast   Continuous Infusions:  meropenem (MERREM) IV 1 g (07/25/22 1011)   PRN Meds:.ALPRAZolam, HYDROmorphone (DILAUDID) injection, ipratropium-albuterol, ondansetron **OR** ondansetron (ZOFRAN) IV, mouth rinse, oxyCODONE-acetaminophen, sodium chloride flush Medications Prior to Admission:  Prior to Admission medications   Medication Sig Start Date End  Date Taking? Authorizing Provider  cyanocobalamin 1000 MCG tablet Take 1,000 mcg by mouth daily.   Yes [provider]  glimepiride (AMARYL) 4 MG tablet Take 4 mg by mouth daily with breakfast. 09/29/21 09/29/22 Yes [provider]  levothyroxine (SYNTHROID) 150 MCG tablet Take 150 mcg by mouth daily before breakfast.   Yes [provider]  metFORMIN (GLUCOPHAGE) 500 MG tablet Take 500 mg by mouth 2 (two) times daily with a meal.   Yes [provider]  metoprolol succinate (TOPROL-XL) 50 MG 24 hr tablet Take 50 mg by mouth daily. Take with or immediately following a meal.   Yes [provider]  Olmesartan-Amlodipine-HCTZ 40-10-25 MG TABS Take 1 tablet by mouth 3 (three) times a week. Mon, Wed, Fri   Yes [provider]  ondansetron (ZOFRAN-ODT) 4 MG disintegrating tablet Take 1 tablet (4 mg total) by mouth every 8 (eight) hours as needed for nausea or vomiting. 06/05/22  Yes Arta Silence, MD  potassium chloride (K-DUR) 10 MEQ tablet Take 10 mEq by mouth daily.   Yes [provider]  sertraline (ZOLOFT) 100 MG tablet Take 100 mg by mouth daily.   Yes [provider]  simvastatin (ZOCOR) 20 MG tablet Take 20 mg by mouth daily.   Yes [provider]  venlafaxine (EFFEXOR) 37.5 MG tablet Take 37.5 mg by mouth daily.   Yes [provider]  sodium chloride flush (NS) 0.9 % SOLN 5 mLs by Intracatheter route every 8 (eight) hours. 05/30/22 08/28/22  Benjamine Sprague, DO   Allergies  Allergen Reactions   Penicillins Anaphylaxis    TOLERATED CEFOTETAN 06/2022   Sulfa Antibiotics Rash   Review of Systems  Unable to perform ROS: Acuity of condition    Physical Exam Vitals and nursing note reviewed.  Constitutional:      General: She is in acute distress.     Appearance: She is ill-appearing.  HENT:     Mouth/Throat:     Mouth: Mucous membranes are moist.  Cardiovascular:     Rate and Rhythm: Normal rate.  Pulmonary:     Comments: BiPAP in place Abdominal:     Palpations: Abdomen is soft.  Skin:    General: Skin is warm and dry.  Neurological:     Mental Status: She is alert and oriented to person, place, and time.  Psychiatric:        Mood and Affect: Mood normal.        Behavior: Behavior normal.     Vital Signs: BP 122/70   Pulse 93   Temp (!) 96.8 F (36 C) (Axillary)   Resp (!) 27   Ht 5' 2.99" (1.6 m)   Wt 70.3 kg   SpO2 94%   BMI 27.46 kg/m  Pain Scale: 0-10 POSS *See Group Information*: 1-Acceptable,Awake and alert Pain Score: 6    SpO2: SpO2: 94 % O2 Device:SpO2: 94 % O2 Flow Rate: .O2 Flow Rate (L/min): 55 L/min  IO: Intake/output summary:  Intake/Output Summary (Last 24 hours) at 07/25/2022 1403 Last data filed at 07/25/2022 1300 Gross per 24 hour  Intake 1136.27 ml  Output 1580 ml  Net -443.73 ml    LBM: Last BM Date : 07/25/22 Baseline Weight: Weight: 55.3 kg Most recent weight: Weight: 70.3 kg     Palliative Assessment/Data:   Flowsheet Rows    Flowsheet Row Most Recent Value  Intake Tab   Referral Department Hospitalist  Unit at Time of Referral Intermediate Care Unit  Palliative Care Primary Diagnosis Sepsis/Infectious Disease  Date Notified 07/24/22  Palliative Care Type New Palliative care  Reason for referral Clarify Goals of Care  Date of Admission  07/19/22  Date first seen by Palliative Care 07/25/22  # of days Palliative referral response time 1 Day(s)  # of days IP prior to Palliative referral 5  Clinical Assessment   Palliative Performance Scale Score 30%  Pain Max last 24 hours Not able to report  Pain Min Last 24 hours Not able to report  Dyspnea Max Last 24 Hours Not able to report  Dyspnea Min Last 24 hours Not able to report  Psychosocial & Spiritual Assessment   Palliative Care Outcomes        Time In: 1230 Time Out: 1325 Time Total: 55 minutes Greater than 50%  of this time was spent counseling and coordinating care related to the above assessment and plan.  Signed by: Drue Novel, NP   Please contact Palliative Medicine Team phone at [phone] for questions and concerns.  For individual provider: See Shea Evans

## 2022-07-25 NOTE — Progress Notes (Signed)
[facility] Cardiology    SUBJECTIVE: Patient appears to be worse today with respiratory distress on BiPAP weak fatigued dyspneic tachypneic denies any chest pain still short of breath   Vitals:   07/25/22 0800 07/25/22 0805 07/25/22 0829 07/25/22 0900  BP: 118/68  125/76 124/75  Pulse: 87 92 90 90  Resp: (!) 28 (!) 31 (!) 29 (!) 26  Temp:      TempSrc:      SpO2: 90% (!) 89% 100% 99%  Weight:      Height:         Intake/Output Summary (Last 24 hours) at 07/25/2022 1017 Last data filed at 07/25/2022 1011 Gross per 24 hour  Intake 1263.73 ml  Output 1635 ml  Net -371.27 ml      PHYSICAL EXAM  General: Well developed, well nourished, in no acute distress HEENT:  Normocephalic and atramatic Neck:  No JVD.  Lungs: Clear bilaterally to auscultation and percussion. Heart: HRRR . Normal S1 and S2 without gallops or murmurs.  Abdomen: Bowel sounds are positive, abdomen soft and non-tender  Msk:  Back normal, normal gait. Normal strength and tone for age. Extremities: No clubbing, cyanosis or edema.   Neuro: Alert and oriented X 3. Psych:  Good affect, responds appropriately   LABS: Basic Metabolic Panel: Recent Labs    07/25/22 0310 07/25/22 0344  NA 143 139  K 3.5 4.0  CL 110 107  CO2 26 27  GLUCOSE 98 106*  BUN 18 21  CREATININE 0.41* 0.39*  CALCIUM 6.4* 7.2*  MG 1.7 2.0  PHOS <1.0* 2.0*   Liver Function Tests: Recent Labs    07/25/22 0310 07/25/22 0344  AST 32 34  ALT 18 19  ALKPHOS 36* 38  BILITOT 0.7 0.8  PROT 4.9* 5.5*  ALBUMIN 2.4* 2.8*   No results for input(s): "LIPASE", "AMYLASE" in the last 72 hours. CBC: Recent Labs    07/25/22 0310 07/25/22 0332  WBC 8.0 8.6  HGB 5.8* 6.4*  HCT 18.9* 21.0*  MCV 88.7 89.7  PLT 192 226   Cardiac Enzymes: No results for input(s): "CKTOTAL", "CKMB", "CKMBINDEX", "TROPONINI" in the last 72 hours. BNP: Invalid input(s): "POCBNP" D-Dimer: No results for input(s): "DDIMER" in the last 72 hours. Hemoglobin  A1C: No results for input(s): "HGBA1C" in the last 72 hours. Fasting Lipid Panel: Recent Labs    07/25/22 0310  TRIG 58   Thyroid Function Tests: No results for input(s): "TSH", "T4TOTAL", "T3FREE", "THYROIDAB" in the last 72 hours.  Invalid input(s): "FREET3" Anemia Panel: Recent Labs    07/23/22 0244 07/23/22 0820  VITAMINB12  --  1,446*  FERRITIN 191  --   TIBC 134*  --   IRON 33  --     DG Chest Port 1 View  Result Date: 07/25/2022 CLINICAL DATA:  Acute respiratory failure EXAM: PORTABLE CHEST 1 VIEW COMPARISON:  07/24/2022 FINDINGS: Cardiac shadow is enlarged but stable. Right PICC is seen deep in the right atrium but stable. Patchy airspace opacities are again identified bilaterally but mildly improved when compared with the prior study. No bony abnormality is noted. IMPRESSION: Slight improved aeration when compared with the previous exam. Electronically Signed   By: Inez Catalina M.D.   On: 07/25/2022 04:10   DG Chest Port 1 View  Result Date: 07/24/2022 CLINICAL DATA:  Acute respiratory failure EXAM: PORTABLE CHEST 1 VIEW COMPARISON:  July 23, 2022 FINDINGS: A right PICC line terminates near the caval atrial junction. The cardiomediastinal silhouette is stable.  Bilateral pulmonary opacities, particularly in the perihilar regions, have worsened. Probable skin fold over the lateral right lung with lung markings on both sides. No convincing evidence of pneumothorax. Decreased lung volumes. No other acute abnormalities. IMPRESSION: 1. The linear stripe over the lateral right mid chest is likely a skin fold as there are lung markings on both sides. No convincing evidence of pneumothorax. Recommend attention on follow-up. 2. Stable right PICC line. 3. Increasing bilateral perihilar opacities. While pulmonary edema is possible, the findings could represent a multifocal infectious process/pneumonia. Recommend clinical correlation and attention on follow-up. Electronically Signed   By:  Dorise Bullion III M.D.   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%  TELEMETRY: Atrial fibrillation rate controlled around 80:  ASSESSMENT AND PLAN:  Principal Problem:   Diverticulitis Active Problems:   Protein-calorie malnutrition, severe   Pressure injury of skin   Cardiogenic shock (HCC)   NSTEMI (non-ST elevated myocardial infarction) (HCC)   Acute systolic CHF (congestive heart failure) (HCC)   New onset atrial fibrillation (HCC)   Atrial fibrillation with RVR (HCC)   Hypophosphatemia   Hyponatremia   Uncontrolled type 2 diabetes mellitus with hyperglycemia, without long-term current use of insulin (HCC)   Hypokalemia   Hypomagnesemia   Acute hypoxemic respiratory failure (HCC) Profound anemia   Plan Continue respiratory intensive care management currently on BiPAP and inhalers mild diuresis Continue diabetes medication Hold anticoagulation for atrial fibrillation Possible non-STEMI we will proceed with cardiac cath when patient stable Atrial fibrillation reasonably controlled rate wise continue current management Patient looks worse than yesterday we will consider right and left heart cath once patient slightly more stable With anemia and possible bleeding is unlikely that we will be able to give anticoagulation and/or Plavix or Brilinta if a stent is needed We will be happy to proceed with suggested plan diagnostic cardiac cath right and left heart if necessary We are withholding Eliquis with possible bleeding and profound anemia continue transfusions hopefully to at least [place], MD 07/25/2022 10:17 AM

## 2022-07-25 NOTE — Progress Notes (Signed)
NAME:  Sarah [name], MRN:  [id number], DOB:  19-Mar-1946, LOS: 6 ADMISSION DATE:  07/19/2022, CONSULTATION DATE:  07/20/22 REFERRING MD:  Benjamine Sprague, DO  REASON FOR CONSULT: Hypotension    HPI  Sarah [name] is a 70yoF with a PMH of hypertension, Graves' disease s/p XRT, type 2 diabetes, IBS, diverticulitis with abscess formation s/p CT-guided drain placement in early June 2023 who presented to [facility] for scheduled laparoscopic sigmoidectomy on 07/19/2022.  Hospital Course: Patient underwent robotic assisted laparoscopic sigmoidectomy, converted to open hartman's procedure. Patient was transferred to medical floor post -op. Overnight patient noted to have decreased urine output, hypotensive and tachycardic.  Due to persistent hypotension despite IV fluid resuscitation, patient was started on peripheral Levophed and transferred to the ICU.  PCCM consulted to assist with management. On arrival to the ICU, vital signs showed blood pressure of 98/60 mm Hg, tachycardia of 115 beats per minute, a respiratory rate of 22 breaths per minute, and a temperature of 98.25F (36.7.C).  Patient remained hemodynamically stable not requiring pressors.   Repeat Pertinent Labs Findings: Chemistry:Na+/ K+: 140/2.8 (142/3.3) Glucose:184  BUN/Cr.:23/1.14 (24/1.16) CBC: WBC:5.4 Hgb/Hct:9.6/30.8:  Other Lab findings:   PCT: 1.39 Lactic acid: 3.4 COVID PCR: Negative, Troponin:  6.999>9.494>8.837>11,207 EKG:  sinus tachycardia with rate 118 with premature atrial contractions and premature ventricular contractions, nonspecific ST changes with poor R wave progression.  Pt developed new onset atrial fibrillation with rvr at 1740 heart rate 130-180's.  Amiodarone bolus followed by continuous amiodarone gtt administered.  Pts troponin's trending back up to 11,207 unable to anticoagulate pt s/p robotic sigmoidectomy converted to open Hartman's procedure. Cardiology was consulted. Initially holding heparin per surgery however, patient  noted with worsening symptoms of nausea, Afib RVR with HR's in the 170's despite being on Amiodarone. She was more lethargic drifting in and out of somnolence. Decision made to restart Heparin after further discussion with General surgery due to high risk for decompensation and intubation. PCCM re-consulted.  Past Medical History  Hypertension, Graves' disease s/p XRT, type 2 diabetes, IBS, diverticulitis with abscess formation s/p CT-guided drain placement in early June 2023  [place] Hospital Events   8/22:S/p robotic sigmoidectomy converted to open Hartman's procedure for complicated diverticulitis. 8/23: Transferred to ICU for hypotension. PCCM consulted for pressor.  Developed Afib RVR with elevated troponin concerns for NSTEMI. Cardiology consulted.  Pt received amiodarone bolus followed by amiodarone gtt.  Overnight required additional amiodarone bolus due to recurrent atrial fibrillation with rvr hr 170's and pt symptomatic.  8/24: Pt remains on amiodarone gtt 30 mg/hr and heparin gtt converted to nsr hr 101.  Requiring neo-synephrine gtt a 10 mcg/min to maintain map >65 8/25: Weaned off pressors.  Concern for developing pulmonary edema, stop IVF and diurese. Remains in A-fib (HF mostly 100-110's) requiring Amiodarone gtt. 8/26: Remains off pressors, overnight required BiPAP due to SOB but weaned off this am, A-fib with RVR which responded to IV metoprolol 8/27: Worsening respiratory status, on BiPAP, high risk for intubation.  ABX broadened to Meropenem due to concern for developing HCAP.  Aggressive diuresis, start steroids.  Consult Palliative Care for [place] discussions 8/28: Pt tolerated Bipap overnight FiO2 100%.  Transitioned to [facility] '@90'$ %/55L   Consults:  PCCM Cardiology General Surgery Palliative Care  Procedures:  8/22: Robotic assisted laparoscopic sigmoidectomy, converted to open hartman's procedure   Significant Diagnostic Tests:  8/24: Chest Xray>No acute  abnormality 8/24: Abdominal xray>No acute abnormality 8/27: CXR>>The linear stripe over the lateral right  mid chest is likely a skin fold as there are lung markings on both sides. No convincing evidence of pneumothorax. Recommend attention on follow-up. Stable right PICC line. Increasing bilateral perihilar opacities. While pulmonary edema is possible, the findings could represent a multifocal infectious process/pneumonia. Recommend clinical correlation and attention on follow-up.  Micro Data:  8/24: MRSA PCR>>negative 8/24:Urine>> 40,000 colonies yeast 8/27: MRSA PCR>>negative  8/27: Covid PCR>>negative  8/27: RVP >>negative   Antimicrobials:  Vancomycin 8/24>8/24 Ceftriaxone 8/22>8/27 Metronidazole 8/22 >8/27 Fluconazole 8/27 x1 dose Meropenem 8/27>>  OBJECTIVE  Blood pressure 113/64, pulse 83, temperature (!) 97.4 F (36.3 C), temperature source Axillary, resp. rate 15, height 5' 2.99" (1.6 m), weight 70.3 kg, SpO2 100 %.   FiO2 (%):  [90 %-100 %] 90 %   Intake/Output Summary (Last 24 hours) at 07/25/2022 0742 Last data filed at 07/25/2022 0700 Gross per 24 hour  Intake 1661.76 ml  Output 1810 ml  Net -148.24 ml   Filed Weights   07/19/22 1033 07/23/22 0500  Weight: 55.3 kg 70.3 kg   Physical Examination  GENERAL: Acutely-ill appearing female, NAD resting in bed on HHFNC 90% 55L  HEENT: Head atraumatic, normocephalic. Oropharynx dry and nasopharynx clear.  Supple, no JVD  LUNGS: Rhonchi throughout, even, non labored  CARDIOVASCULAR: Sinus rhythm, rrr, no r/g, 2+ radial/2+ distal pulses, no edema  ABDOMEN: Hypoactive BS x4, soft, tender, LLQ ileostomy pink/patent draining brown stool  EXTREMITIES: No pedal edema, cyanosis, or clubbing.  NEUROLOGIC:Alert and oriented, follows commands, PERRLA SKIN: Midline abdominal incision dressing intact; RLQ JP drain draining serosanguinous fluid   Labs/imaging that I havepersonally reviewed  (right click and "Reselect all SmartList  Selections" daily)     Labs   CBC: Recent Labs  Lab 07/22/22 0540 07/22/22 1500 07/23/22 0244 07/24/22 0109 07/25/22 0310 07/25/22 0332  WBC 10.4  --  12.0* 9.4 8.0 8.6  HGB 7.9* 7.9* 7.7* 7.4* 5.8* 6.4*  HCT 25.7* 25.3* 24.6* 23.8* 18.9* 21.0*  MCV 88.9  --  86.6 86.9 88.7 89.7  PLT 316  --  325 298 192 101    Basic Metabolic Panel: Recent Labs  Lab 07/22/22 0540 07/23/22 0244 07/23/22 2203 07/24/22 0109 07/24/22 0241 07/25/22 0310 07/25/22 0344  NA 137 134*  --  139  --  143 139  K 3.5 3.5  --  3.3*  --  3.5 4.0  CL 110 108  --  108  --  110 107  CO2 22 23  --  26  --  26 27  GLUCOSE 247* 195*  --  116*  --  98 106*  BUN 24* 25*  --  21  --  18 21  CREATININE 0.61 0.50  --  0.48  --  0.41* 0.39*  CALCIUM 7.8* 7.3*  --  7.1*  --  6.4* 7.2*  MG 1.8 1.8  --   --  1.6* 1.7 2.0  PHOS 2.3* 1.6* 2.6  --  2.2* <1.0* 2.0*   GFR: Estimated Creatinine Clearance: 56.3 mL/min (A) (by C-G formula based on SCr of 0.39 mg/dL (L)). Recent Labs  Lab 07/20/22 2032 07/20/22 2141 07/20/22 2324 07/21/22 0306 07/22/22 0540 07/23/22 0244 07/24/22 0109 07/24/22 0812 07/25/22 0310 07/25/22 0332  PROCALCITON  --    < >  --  1.37 0.54  --   --  0.22 0.11  --   WBC 11.3*  --   --  11.8* 10.4 12.0* 9.4  --  8.0 8.6  LATICACIDVEN 3.4*  --  2.3*  --   --   --   --   --   --   --    < > = values in this interval not displayed.    Liver Function Tests: Recent Labs  Lab 07/22/22 0540 07/25/22 0310 07/25/22 0344  AST 47* 32 34  ALT '23 18 19  '$ ALKPHOS 47 36* 38  BILITOT 0.3 0.7 0.8  PROT 5.1* 4.9* 5.5*  ALBUMIN 1.8* 2.4* 2.8*   No results for input(s): "LIPASE", "AMYLASE" in the last 168 hours. No results for input(s): "AMMONIA" in the last 168 hours.  ABG    Component Value Date/Time   PHART 7.39 07/24/2022 0616   PCO2ART 40 07/24/2022 0616   PO2ART 72 (L) 07/24/2022 0616   HCO3 24.2 07/24/2022 0616   ACIDBASEDEF 0.7 07/24/2022 0616   O2SAT 95.8 07/24/2022 0616      Coagulation Profile: Recent Labs  Lab 07/20/22 2141  INR 1.7*    Cardiac Enzymes: No results for input(s): "CKTOTAL", "CKMB", "CKMBINDEX", "TROPONINI" in the last 168 hours.  HbA1C: Hgb A1c MFr Bld  Date/Time Value Ref Range Status  05/30/2022 03:14 AM 8.3 (H) 4.8 - 5.6 % Final    Comment:    (NOTE)         Prediabetes: 5.7 - 6.4         Diabetes: >6.4         Glycemic control for adults with diabetes: <7.0     CBG: Recent Labs  Lab 07/24/22 1128 07/24/22 1527 07/24/22 2003 07/24/22 2333 07/25/22 0326  GLUCAP 195* 146* 173* 143* 103*    Review of Systems:   Positives in BOLD: Gen: Denies fever, chills, weight change, fatigue, night sweats HEENT: Denies blurred vision, double vision, hearing loss, tinnitus, sinus congestion, rhinorrhea, sore throat, neck stiffness, dysphagia PULM: Denies shortness of breath, cough, sputum production, hemoptysis, wheezing CV: Denies chest pain, edema, orthopnea, paroxysmal nocturnal dyspnea, palpitations GI: Denies mild abdominal pain, nausea, vomiting, diarrhea, hematochezia, melena, constipation, change in bowel habits GU: Denies dysuria, hematuria, polyuria, oliguria, urethral discharge Endocrine: Denies hot or cold intolerance, polyuria, polyphagia or appetite change Derm: Denies rash, dry skin, scaling or peeling skin change Heme: Denies easy bruising, bleeding, bleeding gums Neuro: Denies headache, numbness, weakness, slurred speech, loss of memory or consciousness  Past Medical History  She,  has a past medical history of Arthritis, Cancer ([place]), Diabetes mellitus without complication ([place]), Hyperlipidemia, Hypertension, Hypothyroidism, and IBS (irritable bowel syndrome).   Surgical History    Past Surgical History:  Procedure Laterality Date   ABDOMINAL HYSTERECTOMY     CATARACT EXTRACTION W/PHACO Left 12/21/2016   Procedure: CATARACT EXTRACTION PHACO AND INTRAOCULAR LENS PLACEMENT ([place])  left eye;  Surgeon: Leandrew Koyanagi, MD;  Location: [place];  Service: Ophthalmology;  Laterality: Left;  Diabetic - oral meds   CATARACT EXTRACTION W/PHACO Right 01/25/2017   Procedure: CATARACT EXTRACTION PHACO AND INTRAOCULAR LENS PLACEMENT ([place])  right diabetic;  Surgeon: Leandrew Koyanagi, MD;  Location: [place];  Service: Ophthalmology;  Laterality: Right;  diabetic - oral meds   CHOLECYSTECTOMY     COLONOSCOPY WITH PROPOFOL N/A 10/26/2016   Procedure: COLONOSCOPY WITH PROPOFOL;  Surgeon: Manya Silvas, MD;  Location: [facility] ENDOSCOPY;  Service: Endoscopy;  Laterality: N/A;   IR RADIOLOGIST EVAL & MGMT  06/14/2022   KNEE SURGERY Right      Social History   reports that she has never smoked. She has never used smokeless tobacco. She reports  that she does not drink alcohol and does not use drugs.   Family History   Her family history is negative for Breast cancer.   Allergies Allergies  Allergen Reactions   Penicillins Anaphylaxis    TOLERATED CEFOTETAN 06/2022   Sulfa Antibiotics Rash     Home Medications  Prior to Admission medications   Medication Sig Start Date End Date Taking? Authorizing Provider  cyanocobalamin 1000 MCG tablet Take 1,000 mcg by mouth daily.   Yes [provider]  glimepiride (AMARYL) 4 MG tablet Take 4 mg by mouth daily with breakfast. 09/29/21 09/29/22 Yes [provider]  levothyroxine (SYNTHROID) 150 MCG tablet Take 150 mcg by mouth daily before breakfast.   Yes [provider]  metFORMIN (GLUCOPHAGE) 500 MG tablet Take 500 mg by mouth 2 (two) times daily with a meal.   Yes [provider]  metoprolol succinate (TOPROL-XL) 50 MG 24 hr tablet Take 50 mg by mouth daily. Take with or immediately following a meal.   Yes [provider]  Olmesartan-Amlodipine-HCTZ 40-10-25 MG TABS Take 1 tablet by mouth 3 (three) times a week. Mon, Wed, Fri   Yes [provider]  ondansetron (ZOFRAN-ODT) 4 MG  disintegrating tablet Take 1 tablet (4 mg total) by mouth every 8 (eight) hours as needed for nausea or vomiting. 06/05/22  Yes Arta Silence, MD  potassium chloride (K-DUR) 10 MEQ tablet Take 10 mEq by mouth daily.   Yes [provider]  sertraline (ZOLOFT) 100 MG tablet Take 100 mg by mouth daily.   Yes [provider]  simvastatin (ZOCOR) 20 MG tablet Take 20 mg by mouth daily.   Yes [provider]  venlafaxine (EFFEXOR) 37.5 MG tablet Take 37.5 mg by mouth daily.   Yes [provider]  sodium chloride flush (NS) 0.9 % SOLN 5 mLs by Intracatheter route every 8 (eight) hours. 05/30/22 08/28/22  Benjamine Sprague, DO    Scheduled Meds:  sodium chloride   Intravenous Once   amiodarone  200 mg Oral BID   Followed by   Derrill Memo ON 07/26/2022] amiodarone  200 mg Oral Daily   aspirin  81 mg Oral Daily   atorvastatin  40 mg Oral QHS   Chlorhexidine Gluconate Cloth  6 each Topical Daily   feeding supplement  237 mL Oral TID BM   furosemide  40 mg Intravenous Q12H   gabapentin  300 mg Oral BID   insulin aspart  0-20 Units Subcutaneous Q4H   insulin aspart  5 Units Subcutaneous TID WC   levalbuterol  0.63 mg Nebulization Q6H   And   ipratropium  0.5 mg Nebulization Q6H   methylPREDNISolone (SOLU-MEDROL) injection  40 mg Intravenous Q12H   metoprolol tartrate  25 mg Oral BID   mouth rinse  15 mL Mouth Rinse 4 times per day   pantoprazole (PROTONIX) IV  40 mg Intravenous QHS   sertraline  100 mg Oral Daily   sodium chloride flush  10-40 mL Intracatheter Q12H   venlafaxine  37.5 mg Oral Q breakfast   Continuous Infusions:  heparin Stopped (07/25/22 0434)   meropenem (MERREM) IV 1 g (07/25/22 0254)   sodium phosphate 15 mmol in dextrose 5 % 250 mL infusion 15 mmol (07/25/22 0619)   PRN Meds:.HYDROmorphone (DILAUDID) injection, ipratropium-albuterol, ondansetron **OR** ondansetron (ZOFRAN) IV, mouth rinse, mouth rinse, oxyCODONE-acetaminophen, sodium chloride  flush   Resolved Hospital Problems   Acute kidney injury    Assessment & Plan:   Sepsis secondary  to suspected intraabdominal Infection & UTI, concern for developing HCAP Complicated Diverticulitis s/p robotic sigmoidectomy converted to open Hartman's procedure on 07/19/22 with possible intraabdominal infection  - Trend WBC and monitor fever curve  - Continue meropenem  - Urine culture positive for yeast 08/26: pt received 1x dose of diflucan on 08/27  - PRN antiemetic and pain management - Management per general surgery  NSTEMI  Acute Decompensated HFrEF (LVEF 45-50% on 07/20/22) New onset atrial fibrillation with rvr  Shock: Septic +/- Cardiogenic ~ RESOLVED  Echo 08/23: EF 45 to 50%, left ventricle demonstrates global hypokinesis  - Continuous cardiac monitoring - Maintain map >65 - Heparin gtt discontinued due to worsening anemia  - Diuresis as BP and renal function permits ~ currently on 40 mg IV Lasix q12h - Continue po amiodarone & metoprolol for rate control  - Continue aspirin and atorvastatin  - Cardiology following appreciate input: considering further ischemic work-up pending clinical course, but favoring conservative management   Acute Hypoxic Respiratory Failure in the setting of pulmonary edema & suspected developing HCAP - Supplemental O2 as needed to maintain O2 sats >92% - BiPAP as needed - High risk for intubation - Follow intermittent Chest X-ray & ABG as needed - Scheduled and prn bronchodilator therapy   - ABX as above (broadened to Meropenem 8/27) - Continue iv steroids 40 mg q12hrs - Aggressive Pulmonary toilet as able  Worsening Normocytic Normochromic Anemia without s/sx of overt blood loss - Monitor for S/Sx of bleeding - Trend CBC - Heparin gtt discontinued 08/28 - Transfusing 1 unit pRBC's for hgb of 6.4  Mild Hypokalemia Hypophosphatemia  Lactic Acidosis (3.4 ~2.3 ~) - Trend BMP  - Replace electrolytes as indicated  - Monitor UOP - Avoid  nephrotoxic medications     Type II Diabetes mellitus - CBG's q4hrs - SSI - Hemoglobin A1C pending   Hypothyroidism - Continue Synthroid   Pt is critically ill, high risk for further decompensation, need for intubation, cardiac arrest and death.  Discussed GOC with pt, she confirms Full Code status and that she would want to be intubated if her respiratory status should worsen.  Will consult Palliative Care to assist with further [place] discussions.    Best practice:  Diet: Full Liquid Pain/Anxiety/Delirium protocol (if indicated): Yes  VAP protocol (if indicated): Not indicated DVT prophylaxis: SCD's GI prophylaxis: PPI Glucose control:  SSI Yes Central venous access: PICC line Arterial line:  N/A Foley:  Yes, and it is still needed Mobility:  Mobilize as able   PT consulted: N/A Last date of multidisciplinary goals of care discussion [07/25/22] Code Status:  full code Disposition: ICU    Care time: 36 minutes     Donell Beers, [place] Pager [phone] (please enter 7 digits) PCCM Consult Pager [phone] (please enter 7 digits)

## 2022-07-25 NOTE — Progress Notes (Signed)
[facility]    SUBJECTIVE: Patient states she feels about the same no better no worse denies any pain no worsening shortness of breath lying in bed   Vitals:   07/25/22 0800 07/25/22 0805 07/25/22 0829 07/25/22 0900  BP: 118/68  125/76 124/75  Pulse: 87 92 90 90  Resp: (!) 28 (!) 31 (!) 29 (!) 26  Temp:      TempSrc:      SpO2: 90% (!) 89% 100% 99%  Weight:      Height:         Intake/Output Summary (Last 24 hours) at 07/25/2022 1028 Last data filed at 07/25/2022 1011 Gross per 24 hour  Intake 1263.73 ml  Output 1635 ml  Net -371.27 ml      PHYSICAL EXAM  General: Well developed, well nourished, in no acute distress HEENT:  Normocephalic and atramatic Neck:  No JVD.  Lungs: Clear bilaterally to auscultation and percussion. Heart: HRRR . Normal S1 and S2 without gallops or murmurs.  Abdomen: Bowel sounds are positive, abdomen soft and non-tender  Msk:  Back normal, normal gait. Normal strength and tone for age. Extremities: No clubbing, cyanosis or edema.   Neuro: Alert and oriented X 3. Psych:  Good affect, responds appropriately   LABS: Basic Metabolic Panel: Recent Labs    07/25/22 0310 07/25/22 0344  NA 143 139  K 3.5 4.0  CL 110 107  CO2 26 27  GLUCOSE 98 106*  BUN 18 21  CREATININE 0.41* 0.39*  CALCIUM 6.4* 7.2*  MG 1.7 2.0  PHOS <1.0* 2.0*   Liver Function Tests: Recent Labs    07/25/22 0310 07/25/22 0344  AST 32 34  ALT 18 19  ALKPHOS 36* 38  BILITOT 0.7 0.8  PROT 4.9* 5.5*  ALBUMIN 2.4* 2.8*   No results for input(s): "LIPASE", "AMYLASE" in the last 72 hours. CBC: Recent Labs    07/25/22 0310 07/25/22 0332  WBC 8.0 8.6  HGB 5.8* 6.4*  HCT 18.9* 21.0*  MCV 88.7 89.7  PLT 192 226   Cardiac Enzymes: No results for input(s): "CKTOTAL", "CKMB", "CKMBINDEX", "TROPONINI" in the last 72 hours. BNP: Invalid input(s): "POCBNP" D-Dimer: No results for input(s): "DDIMER" in the last 72 hours. Hemoglobin A1C: No results for  input(s): "HGBA1C" in the last 72 hours. Fasting Lipid Panel: Recent Labs    07/25/22 0310  TRIG 58   Thyroid Function Tests: No results for input(s): "TSH", "T4TOTAL", "T3FREE", "THYROIDAB" in the last 72 hours.  Invalid input(s): "FREET3" Anemia Panel: Recent Labs    07/23/22 0244 07/23/22 0820  VITAMINB12  --  1,446*  FERRITIN 191  --   TIBC 134*  --   IRON 33  --     DG Chest Port 1 View  Result Date: 07/25/2022 CLINICAL DATA:  Acute respiratory failure EXAM: PORTABLE CHEST 1 VIEW COMPARISON:  07/24/2022 FINDINGS: Cardiac shadow is enlarged but stable. Right PICC is seen deep in the right atrium but stable. Patchy airspace opacities are again identified bilaterally but mildly improved when compared with the prior study. No bony abnormality is noted. IMPRESSION: Slight improved aeration when compared with the previous exam. Electronically Signed   By: Inez Catalina M.D.   On: 07/25/2022 04:10   DG Chest Port 1 View  Result Date: 07/24/2022 CLINICAL DATA:  Acute respiratory failure EXAM: PORTABLE CHEST 1 VIEW COMPARISON:  July 23, 2022 FINDINGS: A right PICC line terminates near the caval atrial junction. The cardiomediastinal silhouette is stable. Bilateral  pulmonary opacities, particularly in the perihilar regions, have worsened. Probable skin fold over the lateral right lung with lung markings on both sides. No convincing evidence of pneumothorax. Decreased lung volumes. No other acute abnormalities. IMPRESSION: 1. The linear stripe over the lateral right mid chest is likely a skin fold as there are lung markings on both sides. No convincing evidence of pneumothorax. Recommend attention on follow-up. 2. Stable right PICC line. 3. Increasing bilateral perihilar opacities. While pulmonary edema is possible, the findings could represent a multifocal infectious process/pneumonia. Recommend clinical correlation and attention on follow-up. Electronically Signed   By: Dorise Bullion III  M.D.   On: 07/24/2022 07:52     Echo EF around 45 to 50%  TELEMETRY: Atrial fibrillation rate of around 110:  ASSESSMENT AND PLAN:  Principal Problem:   Diverticulitis Active Problems:   Protein-calorie malnutrition, severe   Pressure injury of skin   Cardiogenic shock (HCC)   NSTEMI (non-ST elevated myocardial infarction) (HCC)   Acute systolic CHF (congestive heart failure) (HCC)   New onset atrial fibrillation (HCC)   Atrial fibrillation with RVR (HCC)   Hypophosphatemia   Hyponatremia   Uncontrolled type 2 diabetes mellitus with hyperglycemia, without long-term current use of insulin (HCC)   Hypokalemia   Hypomagnesemia   Acute hypoxemic respiratory failure ([place])    Plan Patient doing reasonably well stable no worsening symptoms still intensive care level therapy on multiple drips denies any pain Possible non-STEMI peak troponin of 13,000 we will consider cardiac cath when patient stable echocardiogram with preserved left ventricular function with mild cardiomyopathy EF around 45 to 50% no significant overt valvular abnormalities Continue anticoagulation for atrial fibrillation currently on heparin Rate control for atrial fibrillation Increase nutrition therapy if possible Agree with aggressive respiratory support and management Recommend conservative cardiac input at this point until she is stable enough for invasive evaluation and can tolerate anticoagulation long-term   Yolonda Kida, MD, 07/25/2022 10:28 AM

## 2022-07-26 ENCOUNTER — Inpatient Hospital Stay
Admission: RE | Admit: 2022-07-26 | Discharge: 2022-07-26 | Disposition: A | Discharge status: Home / Self Care | Payer: Medicare Other | Source: Ambulatory Visit | Attending: Cardiology | Admitting: Cardiology

## 2022-07-26 ENCOUNTER — Inpatient Hospital Stay: Payer: Medicare Other

## 2022-07-26 DIAGNOSIS — K5792 Diverticulitis of intestine, part unspecified, without perforation or abscess without bleeding: Secondary | ICD-10-CM | POA: Diagnosis not present

## 2022-07-26 DIAGNOSIS — Z7189 Other specified counseling: Secondary | ICD-10-CM | POA: Diagnosis not present

## 2022-07-26 DIAGNOSIS — R570 Cardiogenic shock: Secondary | ICD-10-CM | POA: Diagnosis not present

## 2022-07-26 DIAGNOSIS — Z515 Encounter for palliative care: Secondary | ICD-10-CM | POA: Diagnosis not present

## 2022-07-26 LAB — CBC
HCT: 26.5 % — ABNORMAL LOW (ref 36.0–46.0)
Hemoglobin: 8.2 g/dL — ABNORMAL LOW (ref 12.0–15.0)
MCH: 27.8 pg (ref 26.0–34.0)
MCHC: 30.9 g/dL (ref 30.0–36.0)
MCV: 89.8 fL (ref 80.0–100.0)
Platelets: 294 10*3/uL (ref 150–400)
RBC: 2.95 MIL/uL — ABNORMAL LOW (ref 3.87–5.11)
RDW: 17.2 % — ABNORMAL HIGH (ref 11.5–15.5)
WBC: 10.5 10*3/uL (ref 4.0–10.5)
nRBC: 0.3 % — ABNORMAL HIGH (ref 0.0–0.2)

## 2022-07-26 LAB — PROCALCITONIN: Procalcitonin: 0.11 ng/mL

## 2022-07-26 LAB — BASIC METABOLIC PANEL
Anion gap: 6 (ref 5–15)
BUN: 31 mg/dL — ABNORMAL HIGH (ref 8–23)
CO2: 28 mmol/L (ref 22–32)
Calcium: 7.9 mg/dL — ABNORMAL LOW (ref 8.9–10.3)
Chloride: 108 mmol/L (ref 98–111)
Creatinine, Ser: 0.55 mg/dL (ref 0.44–1.00)
GFR, Estimated: >60 mL/min (ref >60)
Glucose, Bld: 125 mg/dL — ABNORMAL HIGH (ref 70–99)
Potassium: 4.3 mmol/L (ref 3.5–5.1)
Sodium: 142 mmol/L (ref 135–145)

## 2022-07-26 LAB — GLUCOSE, CAPILLARY
Glucose-Capillary: 113 mg/dL — ABNORMAL HIGH (ref 70–99)
Glucose-Capillary: 163 mg/dL — ABNORMAL HIGH (ref 70–99)
Glucose-Capillary: 171 mg/dL — ABNORMAL HIGH (ref 70–99)
Glucose-Capillary: 174 mg/dL — ABNORMAL HIGH (ref 70–99)
Glucose-Capillary: 193 mg/dL — ABNORMAL HIGH (ref 70–99)
Glucose-Capillary: 207 mg/dL — ABNORMAL HIGH (ref 70–99)

## 2022-07-26 LAB — TYPE AND SCREEN
ABO/RH(D): A POS
Antibody Screen: NEGATIVE
Unit division: 0

## 2022-07-26 LAB — BPAM RBC
Blood Product Expiration Date: 202309212359
ISSUE DATE / TIME: 202308280450
Unit Type and Rh: 6200

## 2022-07-26 LAB — MAGNESIUM: Magnesium: 2 mg/dL (ref 1.7–2.4)

## 2022-07-26 LAB — PHOSPHORUS: Phosphorus: 2.9 mg/dL (ref 2.5–4.6)

## 2022-07-26 MED ORDER — ENOXAPARIN SODIUM 40 MG/0.4ML IJ SOSY
40.0000 mg | PREFILLED_SYRINGE | INTRAMUSCULAR | Status: DC
  Administered 2022-07-26 – 2022-07-31 (×5): 40 mg via SUBCUTANEOUS
  Filled 2022-07-26 (×5): qty 0.4

## 2022-07-26 MED ORDER — ACETAZOLAMIDE 250 MG PO TABS
500.0000 mg | ORAL_TABLET | ORAL | Status: AC
  Administered 2022-07-26: 500 mg via ORAL
  Filled 2022-07-26 (×2): qty 2

## 2022-07-26 NOTE — Progress Notes (Signed)
Subjective:  CC: Sarah [name] is a 76 y.o. female  Hospital stay day 7, 7 Days Post-Op robotic sigmoidectomy converted to open Hartman's procedure  HPI: No acute issues overnight.  Tolerating full liquid  ROS:  General: Denies weight loss, weight gain, fatigue, fevers, chills, and night sweats. Heart: Denies chest pain, palpitations, racing heart, irregular heartbeat, leg pain or swelling, and decreased activity tolerance. Respiratory: Denies cough, and sputum. GI: Denies change in appetite, heartburn, nausea, vomiting, constipation, diarrhea, and blood in stool. GU: Denies difficulty urinating, pain with urinating, urgency, frequency, blood in urine.   Objective:   Temp:  [96.3 F (35.7 C)-97.6 F (36.4 C)] 97.6 F (36.4 C) (08/29 0800) Pulse Rate:  [32-116] 98 (08/29 1002) Resp:  [13-31] 27 (08/29 0800) BP: (88-140)/(59-84) 123/77 (08/29 1002) SpO2:  [80 %-99 %] 94 % (08/29 0800) FiO2 (%):  [50 %-80 %] 80 % (08/29 0924) Weight:  [68.9 kg-70.3 kg] 70.3 kg (08/29 1000)     Height: 5' 2.99" (160 cm) Weight: 70.3 kg BMI (Calculated): 27.46   Intake/Output this shift:   Intake/Output Summary (Last 24 hours) at 07/26/2022 1048 Last data filed at 07/26/2022 0800 Gross per 24 hour  Intake 902.41 ml  Output 905 ml  Net -2.59 ml    Constitutional :  alert, cooperative, appears stated age, and no distress  Respiratory:  clear to auscultation bilaterally  Cardiovascular:  Tachycardic  Gastrointestinal: Soft, no guarding, minima TTP . Ostomy pink, patent, stool in bag  Skin: Cool and moist. Incisions c/d/i  Psychiatric: Normal affect, non-agitated, not confused       LABS:     Latest Ref Rng & Units 07/26/2022    4:58 AM 07/25/2022    3:44 AM 07/25/2022    3:10 AM  CMP  Glucose 70 - 99 mg/dL 125  106  98   BUN 8 - 23 mg/dL '31  21  18   '$ Creatinine 0.44 - 1.00 mg/dL 0.55  0.39  0.41   Sodium 135 - 145 mmol/L 142  139  143   Potassium 3.5 - 5.1 mmol/L 4.3  4.0  3.5   Chloride 98  - 111 mmol/L 108  107  110   CO2 22 - 32 mmol/L '28  27  26   '$ Calcium 8.9 - 10.3 mg/dL 7.9  7.2  6.4   Total Protein 6.5 - 8.1 g/dL  5.5  4.9   Total Bilirubin 0.3 - 1.2 mg/dL  0.8  0.7   Alkaline Phos 38 - 126 U/L  38  36   AST 15 - 41 U/L  34  32   ALT 0 - 44 U/L  19  18       Latest Ref Rng & Units 07/26/2022    4:58 AM 07/25/2022   11:32 PM 07/25/2022   11:20 AM  CBC  WBC 4.0 - 10.5 K/uL 10.5     Hemoglobin 12.0 - 15.0 g/dL 8.2  7.9  7.7   Hematocrit 36.0 - 46.0 % 26.5  25.7  25.3   Platelets 150 - 400 K/uL 294       RADS: N/a Assessment:   S/p robotic sigmoidectomy converted to open Hartman's procedure for complicated diverticulitis.  Tolerating full liquid.  Ok to advance to regular as tolerated.  Continue merrem for full course to cover for intra-abdominal infection.  Appreciate cards and ICU support in the meantime for likely post op sepsis, NSTEMI, respiratory distress.  We did discuss her new path results  showing invasive colon CA.  Will discuss further care from oncology standpoint once respiratory status improves.  Staples and JP drain to be removed in a couple days if continues to have no issues   labs/images/medications/previous chart entries reviewed personally and relevant changes/updates noted above.

## 2022-07-26 NOTE — Progress Notes (Addendum)
Patient was seen evaluated and examined by me and the PA on 07/26/22 .  Course of action, evaluation, and management decisions were developed solely by me, but detailed below in the PA's note.  [place] NOTE       Patient ID: Sarah [name] MRN: [id number] DOB/AGE: 08-04-1946 76 y.o.  Admit date: 07/19/2022 Referring Physician Donell Beers, NP  Primary Physician Dr. Sabra Heck Primary Cardiologist Dr. Saralyn Pilar  Reason for Consultation elevated troponin & abnormal EKG, concern for perioperative NSTEMI  HPI: Sarah [name] is a 76yoF with a PMH of hypertension, Graves' disease s/p XRT, type 2 diabetes, IBS, diverticulitis with abscess formation s/p CT-guided drain placement in early June 2023 who presented to [facility] for scheduled laparoscopic sigmoidectomy on 07/19/2022 that was ultimately converted to an open Hartman's procedure, now s/p colostomy. The evening following the procedure she reportedly became hypotensive and tachycardic with SBP in the 90s.  CCM was consulted and the patient was transferred to the ICU early morning of 8/23. Cardiology is consulted the afternoon of 8/23 due to concern for an abnormal EKG and elevated troponin which peaked at 13,300 on 8/24.  Echo shows a newly reduced ejection fraction to 45-50%, consistent with perioperative NSTEMI.  She also developed atrial fibrillation with RVR on 8/24.  Interval History: -transitioned from BIPAP to HFNC at 50L/80% this morning -short paroxysms of tachycardia (SVT) with rate 130, back to SR with frequent PACs in the 90s-low 100s -surgical pathology resulted with invasive colorectal adenocarcinoma, oncology pending improvement in respiratory status per surgery -Continues to feel fatigued.  No chest pain, does not feel short of breath, no palpitations.  Says she "wants to go home" and see her husband, children, and grandchildren.  Does not remember a conversation with palliative yesterday, encouraged further discussions  with palliative and goals moving forward.  Review of systems complete and found to be negative unless listed above    Past Medical History:  Diagnosis Date   Arthritis    hands   Cancer ([place])    thyroid- radiation   Diabetes mellitus without complication ([place])    Hyperlipidemia    Hypertension    Hypothyroidism    IBS (irritable bowel syndrome)     Past Surgical History:  Procedure Laterality Date   ABDOMINAL HYSTERECTOMY     CATARACT EXTRACTION W/PHACO Left 12/21/2016   Procedure: CATARACT EXTRACTION PHACO AND INTRAOCULAR LENS PLACEMENT ([place])  left eye;  Surgeon: Leandrew Koyanagi, MD;  Location: [place];  Service: Ophthalmology;  Laterality: Left;  Diabetic - oral meds   CATARACT EXTRACTION W/PHACO Right 01/25/2017   Procedure: CATARACT EXTRACTION PHACO AND INTRAOCULAR LENS PLACEMENT ([place])  right diabetic;  Surgeon: Leandrew Koyanagi, MD;  Location: [place];  Service: Ophthalmology;  Laterality: Right;  diabetic - oral meds   CHOLECYSTECTOMY     COLONOSCOPY WITH PROPOFOL N/A 10/26/2016   Procedure: COLONOSCOPY WITH PROPOFOL;  Surgeon: Manya Silvas, MD;  Location[facility] ENDOSCOPY;  Service: Endoscopy;  Laterality: N/A;   IR RADIOLOGIST EVAL & MGMT  06/14/2022   KNEE SURGERY Right     Medications Prior to Admission  Medication Sig Dispense Refill Last Dose   cyanocobalamin 1000 MCG tablet Take 1,000 mcg by mouth daily.   07/18/2022   glimepiride (AMARYL) 4 MG tablet Take 4 mg by mouth daily with breakfast.   07/18/2022   levothyroxine (SYNTHROID) 150 MCG tablet Take 150 mcg by mouth daily before breakfast.   07/19/2022 at 0730   metFORMIN (  GLUCOPHAGE) 500 MG tablet Take 500 mg by mouth 2 (two) times daily with a meal.      metoprolol succinate (TOPROL-XL) 50 MG 24 hr tablet Take 50 mg by mouth daily. Take with or immediately following a meal.   07/19/2022 at 0730   Olmesartan-Amlodipine-HCTZ 40-10-25 MG TABS Take 1 tablet by mouth 3 (three) times a  week. Mon, Wed, Fri   07/19/2022 at 0730   ondansetron (ZOFRAN-ODT) 4 MG disintegrating tablet Take 1 tablet (4 mg total) by mouth every 8 (eight) hours as needed for nausea or vomiting. 12 tablet 0 07/19/2022 at 0200   potassium chloride (K-DUR) 10 MEQ tablet Take 10 mEq by mouth daily.   07/18/2022   sertraline (ZOLOFT) 100 MG tablet Take 100 mg by mouth daily.      simvastatin (ZOCOR) 20 MG tablet Take 20 mg by mouth daily.   Past Week   venlafaxine (EFFEXOR) 37.5 MG tablet Take 37.5 mg by mouth daily.   07/19/2022 at 0730   sodium chloride flush (NS) 0.9 % SOLN 5 mLs by Intracatheter route every 8 (eight) hours. 1350 mL 0    Social History   Socioeconomic History   Marital status: Married    Spouse name: Not on file   Number of children: Not on file   Years of education: Not on file   Highest education level: Not on file  Occupational History   Not on file  Tobacco Use   Smoking status: Never   Smokeless tobacco: Never   Tobacco comments:    social as teenager  Vaping Use   Vaping Use: Never used  Substance and Sexual Activity   Alcohol use: No   Drug use: No   Sexual activity: Not on file  Other Topics Concern   Not on file  Social History Narrative   Not on file   Social Determinants of Health   Financial Resource Strain: Not on file  Food Insecurity: Not on file  Transportation Needs: Not on file  Physical Activity: Not on file  Stress: Not on file  Social Connections: Not on file  Intimate Partner Violence: Not on file    Family History  Problem Relation Age of Onset   Breast cancer Neg Hx       PHYSICAL EXAM General: Elderly and frail-appearing Caucasian female, in no acute distress, sitting in incline in ICU bed. HEENT:  Normocephalic and atraumatic. Neck:  No JVD.  Lungs: Somewhat short of breath appearing on high flow nasal cannula coarse breath sounds to auscultation throughout, poor air movement  Heart: Regular rate and rhythm, normal S1 and S2 without  gallops or murmurs.  Abdomen: Non-distended appearing.   Msk: Normal strength and tone for age. Extremities: Warm and well perfused. No clubbing, cyanosis.  Improved upper extremity edema, SCDs in place to bilateral lower legs without significant pedal edema.   Neuro: Alert and oriented X 3. Psych:  Answers questions appropriately.   Labs: Basic Metabolic Panel: Recent Labs    07/25/22 0344 07/26/22 0458  NA 139 142  K 4.0 4.3  CL 107 108  CO2 27 28  GLUCOSE 106* 125*  BUN 21 31*  CREATININE 0.39* 0.55  CALCIUM 7.2* 7.9*  MG 2.0 2.0  PHOS 2.0* 2.9    Liver Function Tests: Recent Labs    07/25/22 0310 07/25/22 0344  AST 32 34  ALT 18 19  ALKPHOS 36* 38  BILITOT 0.7 0.8  PROT 4.9* 5.5*  ALBUMIN 2.4* 2.8*  No results for input(s): "LIPASE", "AMYLASE" in the last 72 hours. CBC: Recent Labs    07/25/22 0332 07/25/22 1120 07/25/22 2332 07/26/22 0458  WBC 8.6  --   --  10.5  HGB 6.4*   < > 7.9* 8.2*  HCT 21.0*   < > 25.7* 26.5*  MCV 89.7  --   --  89.8  PLT 226  --   --  294   < > = values in this interval not displayed.    Cardiac Enzymes: No results for input(s): "CKTOTAL", "CKMB", "CKMBINDEX", "TROPONINIHS" in the last 72 hours.  BNP: Invalid input(s): "POCBNP" D-Dimer: No results for input(s): "DDIMER" in the last 72 hours. Hemoglobin A1C: No results for input(s): "HGBA1C" in the last 72 hours. Fasting Lipid Panel: Recent Labs    07/25/22 0310  TRIG 58    Thyroid Function Tests: No results for input(s): "TSH", "T4TOTAL", "T3FREE", "THYROIDAB" in the last 72 hours.  Invalid input(s): "FREET3" Anemia Panel: No results for input(s): "VITAMINB12", "FOLATE", "FERRITIN", "TIBC", "IRON", "RETICCTPCT" in the last 72 hours.   DG Chest Port 1 View  Result Date: 07/25/2022 CLINICAL DATA:  Acute respiratory failure EXAM: PORTABLE CHEST 1 VIEW COMPARISON:  07/24/2022 FINDINGS: Cardiac shadow is enlarged but stable. Right PICC is seen deep in the right  atrium but stable. Patchy airspace opacities are again identified bilaterally but mildly improved when compared with the prior study. No bony abnormality is noted. IMPRESSION: Slight improved aeration when compared with the previous exam. Electronically Signed   By: Inez Catalina M.D.   On: 07/25/2022 04:10     Radiology: [facility] Chest Port 1 View  Result Date: 07/25/2022 CLINICAL DATA:  Acute respiratory failure EXAM: PORTABLE CHEST 1 VIEW COMPARISON:  07/24/2022 FINDINGS: Cardiac shadow is enlarged but stable. Right PICC is seen deep in the right atrium but stable. Patchy airspace opacities are again identified bilaterally but mildly improved when compared with the prior study. No bony abnormality is noted. IMPRESSION: Slight improved aeration when compared with the previous exam. Electronically Signed   By: Inez Catalina M.D.   On: 07/25/2022 04:10   DG Chest Port 1 View  Result Date: 07/24/2022 CLINICAL DATA:  Acute respiratory failure EXAM: PORTABLE CHEST 1 VIEW COMPARISON:  July 23, 2022 FINDINGS: A right PICC line terminates near the caval atrial junction. The cardiomediastinal silhouette is stable. Bilateral pulmonary opacities, particularly in the perihilar regions, have worsened. Probable skin fold over the lateral right lung with lung markings on both sides. No convincing evidence of pneumothorax. Decreased lung volumes. No other acute abnormalities. IMPRESSION: 1. The linear stripe over the lateral right mid chest is likely a skin fold as there are lung markings on both sides. No convincing evidence of pneumothorax. Recommend attention on follow-up. 2. Stable right PICC line. 3. Increasing bilateral perihilar opacities. While pulmonary edema is possible, the findings could represent a multifocal infectious process/pneumonia. Recommend clinical correlation and attention on follow-up. Electronically Signed   By: Dorise Bullion III M.D.   On: 07/24/2022 07:52   DG Chest Port 1 View  Result Date:  07/23/2022 CLINICAL DATA:  Hypoxic respiratory failure. EXAM: PORTABLE CHEST 1 VIEW COMPARISON:  Portable chest yesterday at 8:27 a.m. FINDINGS: 4:46 a.m. Right PICC tip remains in the upper right atrium. Moderate cardiomegaly is stable. Today there is increased perihilar vascular congestion and central interstitial edema. There are stable small pleural effusions and overlying consolidation or atelectasis, denser on the left. There is increased perihilar opacity on the  right which could be atelectasis, infiltrate or ground-glass edema. The more cephalad lungs remain clear. In all other respects no further changes. There is no pneumothorax.  Aortic atherosclerosis. IMPRESSION: 1. Increased perihilar vascular congestion and mild central interstitial edema. 2. Similar layering pleural effusions and overlying lung opacities. 3. Increased right mid perihilar opacity. Electronically Signed   By: Telford Nab M.D.   On: 07/23/2022 06:21   DG Chest Port 1 View  Result Date: 07/22/2022 CLINICAL DATA:  Status post sigmoid colectomy. EXAM: PORTABLE CHEST 1 VIEW COMPARISON:  July 21, 2022. FINDINGS: Stable cardiomegaly. Interval placement of right-sided PICC line with distal tip in expected position of cavoatrial junction. Mild bibasilar subsegmental atelectasis is noted. Bony thorax is unremarkable. IMPRESSION: Interval placement of right-sided PICC line. Mild bibasilar subsegmental atelectasis is noted with probable minimal pleural effusions. Electronically Signed   By: Marijo Conception M.D.   On: 07/22/2022 08:47   [place] EKG SITE RITE  Result Date: 07/21/2022 If Site Rite image not attached, placement could not be confirmed due to current cardiac rhythm.  DG Abd 1 View  Result Date: 07/21/2022 CLINICAL DATA:  [id number].  Abdominal pain EXAM: ABDOMEN - 1 VIEW COMPARISON:  06/21/2015 FINDINGS: Multiple surgical skin staples overlie the lower abdomen. Normal abdominal gas pattern. No gross free intraperitoneal gas.  Cholecystectomy clips are seen in the right upper quadrant. Surgical drain overlies the mid pelvis. No organomegaly. Osseous structures are age-appropriate. IMPRESSION: Normal abdominal gas pattern. Electronically Signed   By: Fidela Salisbury M.D.   On: 07/21/2022 00:36   DG Chest 1 View  Result Date: 07/21/2022 CLINICAL DATA:  Abdominal pain.  [id number] EXAM: CHEST  1 VIEW COMPARISON:  02/23/2021 FINDINGS: Lungs volumes are small, but are symmetric and are clear. No pneumothorax or pleural effusion. Cardiac size within normal limits. Pulmonary vascularity is normal. Osseous structures are age-appropriate. No acute bone abnormality. IMPRESSION: No active disease. Electronically Signed   By: Fidela Salisbury M.D.   On: 07/21/2022 00:35   ECHOCARDIOGRAM COMPLETE  Result Date: 07/20/2022    ECHOCARDIOGRAM REPORT   Patient Name:   MELAYAH SKORUPSKI Date of Exam: 07/20/2022 Medical Rec #:  [id number]   Height:       63.0 in Accession #:    [id number]  Weight:       122.0 lb Date of Birth:  Nov 22, 1946   BSA:          1.567 m Patient Age:    48 years    BP:           105/73 mmHg Patient Gender: F           HR:           126 bpm. Exam Location:  [facility] Procedure: 2D Echo, Color Doppler and Cardiac Doppler Indications:     I21.4 NSTEMI  History:         Patient has no prior history of Echocardiogram examinations.                  Arrythmias:Tachycardia; Risk Factors:Hypertension, Diabetes and                  Dyslipidemia.  Sonographer:     Charmayne Sheer Referring Phys:  [id number] [place] TANG Diagnosing Phys: Yolonda Kida MD  Sonographer Comments: No subcostal window. IMPRESSIONS  1. Left ventricular ejection fraction, by estimation, is 45 to 50%. The left ventricle has mildly decreased function. The left ventricle demonstrates global hypokinesis. The  left ventricular internal cavity size was mildly to moderately dilated. Left ventricular diastolic parameters were normal.  2. Right ventricular systolic function is normal. The  right ventricular size is normal.  3. The mitral valve is normal in structure. No evidence of mitral valve regurgitation.  4. The aortic valve is normal in structure. Aortic valve regurgitation is not visualized. FINDINGS  Left Ventricle: Left ventricular ejection fraction, by estimation, is 45 to 50%. The left ventricle has mildly decreased function. The left ventricle demonstrates global hypokinesis. The left ventricular internal cavity size was mildly to moderately dilated. There is borderline left ventricular hypertrophy. Left ventricular diastolic parameters were normal. Right Ventricle: The right ventricular size is normal. No increase in right ventricular wall thickness. Right ventricular systolic function is normal. Left Atrium: Left atrial size was normal in size. Right Atrium: Right atrial size was normal in size. Pericardium: There is no evidence of pericardial effusion. Mitral Valve: The mitral valve is normal in structure. No evidence of mitral valve regurgitation. Tricuspid Valve: The tricuspid valve is normal in structure. Tricuspid valve regurgitation is trivial. Aortic Valve: The aortic valve is normal in structure. Aortic valve regurgitation is not visualized. Aortic valve mean gradient measures 5.0 mmHg. Aortic valve peak gradient measures 9.0 mmHg. Aortic valve area, by VTI measures 2.19 cm. Pulmonic Valve: The pulmonic valve was normal in structure. Pulmonic valve regurgitation is not visualized. Aorta: The ascending aorta was not well visualized. IAS/Shunts: No atrial level shunt detected by color flow Doppler.  LEFT VENTRICLE PLAX 2D LVIDd:         5.24 cm   Diastology LVIDs:         3.95 cm   LV e' medial:    4.79 cm/s LV PW:         1.53 cm   LV E/e' medial:  17.3 LV IVS:        0.93 cm   LV e' lateral:   7.51 cm/s LVOT diam:     2.00 cm   LV E/e' lateral: 11.1 LV SV:         51 LV SV Index:   32 LVOT Area:     3.14 cm  RIGHT VENTRICLE RV Basal diam:  2.79 cm RV S prime:     26.90 cm/s  LEFT ATRIUM           Index        RIGHT ATRIUM           Index LA diam:      3.80 cm 2.42 cm/m   RA Area:     15.10 cm LA Vol (A2C): 33.8 ml 21.57 ml/m  RA Volume:   38.80 ml  24.76 ml/m  AORTIC VALVE AV Area (Vmax):    1.94 cm AV Area (Vmean):   1.77 cm AV Area (VTI):     2.19 cm AV Vmax:           150.00 cm/s AV Vmean:          110.000 cm/s AV VTI:            0.232 m AV Peak Grad:      9.0 mmHg AV Mean Grad:      5.0 mmHg LVOT Vmax:         92.70 cm/s LVOT Vmean:        61.900 cm/s LVOT VTI:          0.162 m LVOT/AV VTI ratio: 0.70  AORTA Ao Root diam: 3.10  cm MITRAL VALVE MV Area (PHT): 10.25 cm   SHUNTS MV Decel Time: 74 msec     Systemic VTI:  0.16 m MV E velocity: 83.05 cm/s  Systemic Diam: 2.00 cm MV A velocity: 78.65 cm/s MV E/A ratio:  1.06 Sarah Tatum D Zell Doucette MD Electronically signed by Yolonda Kida MD Signature Date/Time: 07/20/2022/5:44:47 PM    Final     ECHO (stress echo) 02/14/2017  Ref Range & Units 5 yr ago  LV Ejection Fraction (%)  55   Aortic Valve Regurgitation Grade  none   Aortic Valve Stenosis Grade  none   Mitral Valve Regurgitation Grade  trivial   Mitral Valve Stenosis Grade  none   Tricuspid Valve Regurgitation Grade  trivial   Tricuspid Valve Regurgitation Max Velocity (m/s) m/sec 1.4   Right Ventricle Systolic Pressure (mmHg) mmHg 12.4   Narrative                     CARDIOLOGY DEPARTMENT                   [place], [place]                      [id number]                    [facility]                 Acct #: [id number]          [address], [place], [place] [address]       Date: 02/14/2017 02:57 PM                                                             Adult Female Age: 68 yrs                     ECHOCARDIOGRAM REPORT                   Outpatient                                                             KC::KCWC            STUDY:Stress Echo        TAPE:                   MD1:  DRANE, ANNA MARIA              ECHO:Yes   DOPPLER:Yes  FILE:0000-000-000       BP: 148/83 mmHg            COLOR:Yes  CONTRAST:No      MACHINE:[place]      Height: 63 in        RV BIOPSY:No         3D:No   SOUND QLTY:Moderate  Weight: 174 lb           MEDIUM:None                                       BSA: 1.8 m2   ___________________________________________________________________________________________     HISTORY:Chest pain      REASON:Assess, LV function  Indication:R07.89 Other chest pain, I10 Essential (primary) hypertension, R06.02             Shortness of breath  ___________________________________________________________________________________________   STRESS ECHOCARDIOGRAPHY            Protocol:Treadmill ([place])              Drugs:None  Target Heart Rate:128 bpm        Maximum Predicted Heart Rate: 150 bpm   +-------------------+-------------------------+-------------------------+------------+         Stage            Duration (mm:ss)         Heart Rate (bpm)         BP       +-------------------+-------------------------+-------------------------+------------+        RESTING                                           85              148/83     +-------------------+-------------------------+-------------------------+------------+        EXERCISE                4:49                     210                /        +-------------------+-------------------------+-------------------------+------------+        RECOVERY                8:05                      94              176/88     +-------------------+-------------------------+-------------------------+------------+     Stress Duration:4:49 mm:ss    Max Stress H.R.:210 bpm        Target Heart Rate Achieved: Yes   ___________________________________________________________________________________________   WALL SEGMENT CHANGES                         Rest          Stress  Anterior Septum Basal:Normal        Hyperkinetic                     DXA:JOINOM        Hyperkinetic                 Apical:Normal        Hyperkinetic     Anterior Wall Basal:Normal        Hyperkinetic                    VEH:MCNOBS        Hyperkinetic                 Apical:Normal  Hyperkinetic      Lateral Wall Basal:Normal        Hyperkinetic                    CVE:LFYBOF        Hyperkinetic                 Apical:Normal        Hyperkinetic    Posterior Wall Basal:Normal        Hyperkinetic                    BPZ:WCHENI        Hyperkinetic     Inferior Wall Basal:Normal        Hyperkinetic                    DPO:EUMPNT        Hyperkinetic                 Apical:Normal        Hyperkinetic   Inferior Septum Basal:Normal        Hyperkinetic                    IRW:ERXVQM        Hyperkinetic              Resting EF:>55% (Est.)   Stress EF: >55% (Est.)   ___________________________________________________________________________________________   ADDITIONAL FINDINGS    ___________________________________________________________________________________________   STRESS ECG RESULTS                                                ECG Results:Normal   ___________________________________________________________________________________________  ECHOCARDIOGRAPHIC DESCRIPTIONS   LEFT VENTRICLE         Size:Normal  Contraction:Normal    LV Masses:No Masses          GQQ:PYPP   RIGHT VENTRICLE         Size:Normal                Free Wall:Normal  Contraction:Normal                RV Masses:No mass   PERICARDIUM        Fluid:No effusion    _______________________________________________________________________________________  DOPPLER ECHO and OTHER SPECIAL PROCEDURES     Aortic:No AR                      No AS      Mitral:TRIVIAL MR                 No MS            MV Inflow E Vel=nm*        MV Annulus E'Vel=nm*            E/E'Ratio=nm*   Tricuspid:TRIVIAL TR                 No TS            136.0 cm/sec peak TR vel    12.4 mmHg peak RV pressure   Pulmonary:No PR                      No PS     ___________________________________________________________________________________________  ECHOCARDIOGRAPHIC MEASUREMENTS  2D DIMENSIONS  AORTA  Values      Normal Range      MAIN PA          Values      Normal Range            Annulus:  nm*       [2.1 - 2.5]                PA Main:  nm*       [1.5 - 2.1]          Aorta Sin:  nm*       [2.7 - 3.3]       RIGHT VENTRICLE        ST Junction:  nm*       [2.3 - 2.9]                RV Base:  nm*       [ < 4.2]          Asc.Aorta:  nm*       [2.3 - 3.1]                 RV Mid:  nm*       [ < 3.5]   LEFT VENTRICLE                                        RV Length:  nm*       [ < 8.6]              LVIDd:  nm*       [3.9 - 5.3]       INFERIOR VENA CAVA              LVIDs:  nm*                                 Max. IVC:  nm*       [ <= 2.1]                 FS:  nm*       [> 25]                    Min. IVC:  nm*                SWT:  nm*       [0.5 - 0.9]                   ------------------                PWT:  nm*       [0.5 - 0.9]                   nm* - not measured   LEFT ATRIUM            LA Diam:  nm*       [2.7 - 3.8]        LA A4C Area:  nm*       [ < 20]          LA Volume:  nm*       [22 - 52]    ___________________________________________________________________________________________  INTERPRETATION  Normal Stress Echocardiogram  NORMAL RIGHT VENTRICULAR SYSTOLIC FUNCTION  TRIVIAL REGURGITATION NOTED (See above)  NO VALVULAR STENOSIS NOTED    ___________________________________________________________________________________________  Electronically signed by: MD Miquel Dunn on 02/15/2017 10:07 AM              Performed By: Johnathan Hausen, RDCS, RVT        Ordering Physician: Clabe Seal   TELEMETRY reviewed by me (LT) 07/26/2022 : Sinus rhythm rate 80s to 90s   EKG reviewed by me and Dr. Saralyn Pilar: 8/23 sinus tachycardia with rate 118  nonspecific T wave changes, repeat at 12:00 without significant changes. 1700 on 8/23, AF RVR with nonspecific ST changes but no stemi per Dr. Saralyn Pilar  Data reviewed by me (LT) 07/26/2022: Critical care note, surgery note, CBC, BMP, chest x-ray, telemetry, procalcitonin, surgical pathology  ASSESSMENT AND PLAN:  Sarah [name] is a 22yoF with a PMH of hypertension, Graves' disease s/p XRT, type 2 diabetes, IBS, diverticulitis with abscess formation s/p CT-guided drain placement in early June 2023 who presented to [facility] for scheduled laparoscopic sigmoidectomy on 07/19/2022 that was ultimately converted to an open Hartman's procedure, now s/p colostomy. The evening following the procedure she reportedly became hypotensive and tachycardic with SBP in the 90s.  CCM was consulted and the patient was transferred to the ICU early morning of 8/23. Cardiology is consulted the afternoon of 8/23 due to concern for an abnormal EKG and elevated troponin which peaked at 13,300 on 8/24.  Echo shows a newly reduced ejection fraction to 45-50%, consistent with perioperative NSTEMI.  She also developed atrial fibrillation with RVR on 8/24.  #Sepsis 2/2 suspected intra-abdominal infection vs UTI #Diverticulitis s/p robotic sigmoidectomy converted to open Hartman's procedure on 07/19/22 #Invasive colorectal adenocarcinoma -Seen on surgical pathology, oncology work-up pending improvement in her respiratory status per surgery -Appreciate palliative involvement  #acute hypoxic respiratory failure Agree with current therapy per primary team & surgery  #perioperative NSTEMI Underwent scheduled robotic sigmoidectomy for diverticulitis on 8/22 with Dr. Lysle Pearl, the surgery ultimately was converted to an open Hartman's procedure and she is now s/p colostomy.  Overnight on the floor she became hypotensive and tachycardic, and was transferred to the ICU for closer monitoring.  Ultimately she did not require vasopressor support and  blood pressures been stable throughout the day.  A troponin was checked which was significantly elevated at 7000, and repeat at 9500.  EKGs read as acute MI by the computer, confirmed with Dr. Clayborn Bigness not to be a STEMI, but with nonspecific T wave changes and poor R wave progression, overall not significantly changed from prior in July 2023. -Concern for perioperative NSTEMI with troponin peak at 13,300 and newly reduced ejection fraction & LV global hypokinesis -S/p 325 mg aspirin, continue 81 mg aspirin daily  -hold heparin GTT with drop in H&H and ongoing anemia -S/p Neo-Synephrine -continue metoprolol tartrate '25mg'$  BID  -Hold home antihypertensives irbesartan 150 mg, amlodipine 10 mg and HCTZ 12.'5mg'$  -Continue atorvastatin 40 mg once daily -Monitor and replete electrolytes for a magnesium >2, potassium >4 -Continuous monitoring on telemetry while inpatient -ongoing consideration for further ischemic work-up pending clinical course, but continue to favor conservative management from a cardiac standpoint, per Dr. 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 BNP elevated at 1600, increasing to  3100. Somewhat edematous appearing in her upper extremities, although improving.  Chest x-ray without much change in aeration from 8/28 to 8/29, bilateral multifocal airspace opacities still present. -s/p IV lasix '20mg'$  x 2, '40mg'$  x 8 doses.  - continue lasix '40mg'$  IV BID for now  #New onset paroxysmal atrial fibrillation with RVR #hypothyroidism (History of Graves' disease s/p XRT) Developed the evening of 8/23, heart rates in the 160s, s/p amiodarone bolus x2.  -S/p amiodarone gtt.  -continue p.o. amiodarone 200 mg twice  daily x 4 days, then 200 mg daily thereafter starting 8/29. -stop heparin GTT with worsening anemia requiring transfusion, hold off restarting this for now. CHA2DS2-VASc 6 (age, sex, chf, htn, MI) -thyroid panel: Slightly low TSH at 0.368, T4 within normal limits 4.9, T3 uptake slightly elevated at 43%  This patient's plan of care was discussed and created with Dr. Clayborn Bigness and he is in agreement.  Signed: Tristan Schroeder , PA-C 07/26/2022, 10:02 AM [facility]

## 2022-07-26 NOTE — Progress Notes (Signed)
Palliative: Sarah [name] is sitting up in the bed.  She greets me, making and mostly keeping eye contact.  She is alert and oriented, able to make her needs known.  There is no family at bedside at this time.  We talk about her acute illness.  We talk about her respiratory status and improvement.  Overall, Sarah [name] is able to tell me accurately what is going on with her health.  She talks about her surgery and ostomy, and states that the results came back positive for cancer.  We talk about following up outpatient with oncology for next steps. I share that it seems she will likely need short-term rehab.  I reassured her that the hospital team will help her.  I ask if she has any questions about CODE STATUS.  She declines questions.  I ask if she has considered setting limits on length of time to work to need intubation/ventilation.  She tells me that she has not thought about this.  I encouraged her to have these discussions with her brothers when they arrive.  Sarah [name] tells me that her brothers, Sarah [name] and Sarah [name], visit daily.  We talk about trying to set up a visit with her husband.  She shares that she was caregiving for her husband until she became ill in June.  She tells me that her husband is now at the hospice facility.  She shares that he was supposed to go for a 5-day respite, but will stay there now.  She declines assistance in attempting to get him to the hospital, stating that he should stay where he is and that she does not want to upset him.  Conference with attending, bedside nursing staff, transition of care team related to patient condition, needs, goals of care, disposition.  Plan: Continue to treat the treatable.  No limits set on life support.  Time for outcomes.  Anticipate need for short-term rehab.  Ultimate goal is to return home.  Understands that she must see oncology outpatient.  23 minutes  Quinn Axe, NP Palliative medicine team Team phone [phone] Greater than 50%  of this time was spent counseling and coordinating care related to the above assessment and plan.

## 2022-07-26 NOTE — Progress Notes (Signed)
*  PRELIMINARY RESULTS* Echocardiogram 2D Echocardiogram has been performed.  Sherrie Sport 07/26/2022, 1:21 PM

## 2022-07-26 NOTE — Progress Notes (Signed)
NAME:  Sarah [name], MRN:  [id number], DOB:  September 03, 1946, LOS: 7 ADMISSION DATE:  07/19/2022, CONSULTATION DATE:  07/20/22 REFERRING MD:  Benjamine Sprague, DO  REASON FOR CONSULT: Hypotension    HPI  Sarah [name] is a 32yoF with a PMH of hypertension, Graves' disease s/p XRT, type 2 diabetes, IBS, diverticulitis with abscess formation s/p CT-guided drain placement in early June 2023 who presented to [facility] for scheduled laparoscopic sigmoidectomy on 07/19/2022.  Hospital Course: Patient underwent robotic assisted laparoscopic sigmoidectomy, converted to open hartman's procedure. Patient was transferred to medical floor post -op. Overnight patient noted to have decreased urine output, hypotensive and tachycardic.  Due to persistent hypotension despite IV fluid resuscitation, patient was started on peripheral Levophed and transferred to the ICU.  PCCM consulted to assist with management. On arrival to the ICU, vital signs showed blood pressure of 98/60 mm Hg, tachycardia of 115 beats per minute, a respiratory rate of 22 breaths per minute, and a temperature of 98.42F (36.7.C).  Patient remained hemodynamically stable not requiring pressors.   Repeat Pertinent Labs Findings: Chemistry:Na+/ K+: 140/2.8 (142/3.3) Glucose:184  BUN/Cr.:23/1.14 (24/1.16) CBC: WBC:5.4 Hgb/Hct:9.6/30.8:  Other Lab findings:   PCT: 1.39 Lactic acid: 3.4 COVID PCR: Negative, Troponin:  6.999>9.494>8.837>11,207 EKG:  sinus tachycardia with rate 118 with premature atrial contractions and premature ventricular contractions, nonspecific ST changes with poor R wave progression.  Pt developed new onset atrial fibrillation with rvr at 1740 heart rate 130-180's.  Amiodarone bolus followed by continuous amiodarone gtt administered.  Pts troponin's trending back up to 11,207 unable to anticoagulate pt s/p robotic sigmoidectomy converted to open Hartman's procedure. Cardiology was consulted. Initially holding heparin per surgery however, patient  noted with worsening symptoms of nausea, Afib RVR with HR's in the 170's despite being on Amiodarone. She was more lethargic drifting in and out of somnolence. Decision made to restart Heparin after further discussion with General surgery due to high risk for decompensation and intubation. PCCM re-consulted.  Past Medical History  Hypertension, Graves' disease s/p XRT, type 2 diabetes, IBS, diverticulitis with abscess formation s/p CT-guided drain placement in early June 2023  [place] [facility] Events   8/22:S/p robotic sigmoidectomy converted to open Hartman's procedure for complicated diverticulitis. 8/23: Transferred to ICU for hypotension. PCCM consulted for pressor.  Developed Afib RVR with elevated troponin concerns for NSTEMI. Cardiology consulted.  Pt received amiodarone bolus followed by amiodarone gtt.  Overnight required additional amiodarone bolus due to recurrent atrial fibrillation with rvr hr 170's and pt symptomatic.  8/24: Pt remains on amiodarone gtt 30 mg/hr and heparin gtt converted to nsr hr 101.  Requiring neo-synephrine gtt a 10 mcg/min to maintain map >65 8/25: Weaned off pressors.  Concern for developing pulmonary edema, stop IVF and diurese. Remains in A-fib (HF mostly 100-110's) requiring Amiodarone gtt. 8/26: Remains off pressors, overnight required BiPAP due to SOB but weaned off this am, A-fib with RVR which responded to IV metoprolol 8/27: Worsening respiratory status, on BiPAP, high risk for intubation.  ABX broadened to Meropenem due to concern for developing HCAP.  Aggressive diuresis, start steroids.  Consult Palliative Care for [place] discussions 8/28: Pt tolerated Bipap overnight FiO2 100%.  Transitioned to [facility] '@90'$ %/55L  8/29: No acute events overnight currently on Bipap FiO2 '@50A'$ % with O2 sats 96%.  Will transition to [facility] as tolerated   Consults:  PCCM Cardiology General Surgery Palliative Care  Procedures:  8/22: Robotic assisted laparoscopic  sigmoidectomy, converted to open hartman's procedure  Significant Diagnostic Tests:  8/24: Chest Xray>No acute abnormality 8/24: Abdominal xray>No acute abnormality 8/27: CXR>>The linear stripe over the lateral right mid chest is likely a skin fold as there are lung markings on both sides. No convincing evidence of pneumothorax. Recommend attention on follow-up. Stable right PICC line. Increasing bilateral perihilar opacities. While pulmonary edema is possible, the findings could represent a multifocal infectious process/pneumonia. Recommend clinical correlation and attention on follow-up.  Micro Data:  8/24: MRSA PCR>>negative 8/24:Urine>> 40,000 colonies yeast 8/27: MRSA PCR>>negative  8/27: Covid PCR>>negative  8/27: RVP >>negative   Antimicrobials:  Vancomycin 8/24>8/24 Ceftriaxone 8/22>8/27 Metronidazole 8/22 >8/27 Fluconazole 8/27 x1 dose Meropenem 8/27>>  OBJECTIVE  Blood pressure 125/84, pulse 95, temperature 97.6 F (36.4 C), temperature source Oral, resp. rate (!) 23, height 5' 2.99" (1.6 m), weight 68.9 kg, SpO2 93 %.   FiO2 (%):  [50 %-100 %] 50 %   Intake/Output Summary (Last 24 hours) at 07/26/2022 0719 Last data filed at 07/26/2022 0600 Gross per 24 hour  Intake 786.8 ml  Output 1085 ml  Net -298.2 ml   Filed Weights   07/19/22 1033 07/23/22 0500 07/26/22 0500  Weight: 55.3 kg 70.3 kg 68.9 kg   Physical Examination  GENERAL: Acutely-ill appearing female, NAD resting in bed on HHFNC 90% 55L  HEENT: Head atraumatic, normocephalic. Oropharynx dry and nasopharynx clear.  Supple, no JVD  LUNGS: Diminished throughout, even, non labored  CARDIOVASCULAR: Sinus rhythm, rrr, no r/g, 2+ radial/2+ distal pulses, no edema  ABDOMEN: Hypoactive BS x4, soft, tender, LLQ ileostomy pink/patent draining brown stool  EXTREMITIES: No pedal edema, cyanosis, or clubbing.  NEUROLOGIC:Alert and oriented, follows commands, PERRLA SKIN: Midline abdominal incision dressing intact;  RLQ JP drain draining scant amount of serosanguinous fluid   Labs/imaging that I havepersonally reviewed  (right click and "Reselect all SmartList Selections" daily)     Labs   CBC: Recent Labs  Lab 07/23/22 0244 07/24/22 0109 07/25/22 0310 07/25/22 0332 07/25/22 1120 07/25/22 2332 07/26/22 0458  WBC 12.0* 9.4 8.0 8.6  --   --  10.5  HGB 7.7* 7.4* 5.8* 6.4* 7.7* 7.9* 8.2*  HCT 24.6* 23.8* 18.9* 21.0* 25.3* 25.7* 26.5*  MCV 86.6 86.9 88.7 89.7  --   --  89.8  PLT 325 298 192 226  --   --  254    Basic Metabolic Panel: Recent Labs  Lab 07/23/22 0244 07/23/22 2203 07/24/22 0109 07/24/22 0241 07/25/22 0310 07/25/22 0344 07/26/22 0458  NA 134*  --  139  --  143 139 142  K 3.5  --  3.3*  --  3.5 4.0 4.3  CL 108  --  108  --  110 107 108  CO2 23  --  26  --  '26 27 28  '$ GLUCOSE 195*  --  116*  --  98 106* 125*  BUN 25*  --  21  --  18 21 31*  CREATININE 0.50  --  0.48  --  0.41* 0.39* 0.55  CALCIUM 7.3*  --  7.1*  --  6.4* 7.2* 7.9*  MG 1.8  --   --  1.6* 1.7 2.0 2.0  PHOS 1.6* 2.6  --  2.2* <1.0* 2.0* 2.9   GFR: Estimated Creatinine Clearance: 55.7 mL/min (by C-G formula based on SCr of 0.55 mg/dL). Recent Labs  Lab 07/20/22 2032 07/20/22 2141 07/20/22 2324 07/21/22 0306 07/22/22 0540 07/23/22 0244 07/24/22 0109 07/24/22 2706 07/25/22 0310 07/25/22 0332 07/26/22 0458  PROCALCITON  --    < >  --    < >  0.54  --   --  0.22 0.11  --  0.11  WBC 11.3*  --   --    < > 10.4   < > 9.4  --  8.0 8.6 10.5  LATICACIDVEN 3.4*  --  2.3*  --   --   --   --   --   --   --   --    < > = values in this interval not displayed.    Liver Function Tests: Recent Labs  Lab 07/22/22 0540 07/25/22 0310 07/25/22 0344  AST 47* 32 34  ALT '23 18 19  '$ ALKPHOS 47 36* 38  BILITOT 0.3 0.7 0.8  PROT 5.1* 4.9* 5.5*  ALBUMIN 1.8* 2.4* 2.8*   No results for input(s): "LIPASE", "AMYLASE" in the last 168 hours. No results for input(s): "AMMONIA" in the last 168 hours.  ABG     Component Value Date/Time   PHART 7.39 07/24/2022 0616   PCO2ART 40 07/24/2022 0616   PO2ART 72 (L) 07/24/2022 0616   HCO3 24.2 07/24/2022 0616   ACIDBASEDEF 0.7 07/24/2022 0616   O2SAT 95.8 07/24/2022 0616     Coagulation Profile: Recent Labs  Lab 07/20/22 2141  INR 1.7*    Cardiac Enzymes: No results for input(s): "CKTOTAL", "CKMB", "CKMBINDEX", "TROPONINI" in the last 168 hours.  HbA1C: Hgb A1c MFr Bld  Date/Time Value Ref Range Status  05/30/2022 03:14 AM 8.3 (H) 4.8 - 5.6 % Final    Comment:    (NOTE)         Prediabetes: 5.7 - 6.4         Diabetes: >6.4         Glycemic control for adults with diabetes: <7.0     CBG: Recent Labs  Lab 07/25/22 1210 07/25/22 1530 07/25/22 1947 07/25/22 2322 07/26/22 0506  GLUCAP 191* 227* 148* 149* 113*    Review of Systems:   Positives in BOLD: Gen: fever, chills, weight change, fatigue, night sweats HEENT: Denies blurred vision, double vision, hearing loss, tinnitus, sinus congestion, rhinorrhea, sore throat, neck stiffness, dysphagia PULM: intermittent shortness of breath, cough, sputum production, hemoptysis, wheezing CV: Denies chest pain, edema, orthopnea, paroxysmal nocturnal dyspnea, palpitations GI: Denies mild abdominal pain, nausea, vomiting, diarrhea, hematochezia, melena, constipation, change in bowel habits GU: Denies dysuria, hematuria, polyuria, oliguria, urethral discharge Endocrine: Denies hot or cold intolerance, polyuria, polyphagia or appetite change Derm: Denies rash, dry skin, scaling or peeling skin change Heme: Denies easy bruising, bleeding, bleeding gums Neuro: Denies headache, numbness, weakness, slurred speech, loss of memory or consciousness  Past Medical History  She,  has a past medical history of Arthritis, Cancer ([place]), Diabetes mellitus without complication ([place]), Hyperlipidemia, Hypertension, Hypothyroidism, and IBS (irritable bowel syndrome).   Surgical History    Past Surgical  History:  Procedure Laterality Date   ABDOMINAL HYSTERECTOMY     CATARACT EXTRACTION W/PHACO Left 12/21/2016   Procedure: CATARACT EXTRACTION PHACO AND INTRAOCULAR LENS PLACEMENT ([place])  left eye;  Surgeon: Leandrew Koyanagi, MD;  Location: [place];  Service: Ophthalmology;  Laterality: Left;  Diabetic - oral meds   CATARACT EXTRACTION W/PHACO Right 01/25/2017   Procedure: CATARACT EXTRACTION PHACO AND INTRAOCULAR LENS PLACEMENT ([place])  right diabetic;  Surgeon: Leandrew Koyanagi, MD;  Location: [place];  Service: Ophthalmology;  Laterality: Right;  diabetic - oral meds   CHOLECYSTECTOMY     COLONOSCOPY WITH PROPOFOL N/A 10/26/2016   Procedure: COLONOSCOPY WITH PROPOFOL;  Surgeon: Manya Silvas, MD;  Location: [facility];  Service: Endoscopy;  Laterality: N/A;   IR RADIOLOGIST EVAL & MGMT  06/14/2022   KNEE SURGERY Right      Social History   reports that she has never smoked. She has never used smokeless tobacco. She reports that she does not drink alcohol and does not use drugs.   Family History   Her family history is negative for Breast cancer.   Allergies Allergies  Allergen Reactions   Penicillins Anaphylaxis    TOLERATED CEFOTETAN 06/2022   Sulfa Antibiotics Rash     Home Medications  Prior to Admission medications   Medication Sig Start Date End Date Taking? Authorizing Provider  cyanocobalamin 1000 MCG tablet Take 1,000 mcg by mouth daily.   Yes [provider]  glimepiride (AMARYL) 4 MG tablet Take 4 mg by mouth daily with breakfast. 09/29/21 09/29/22 Yes [provider]  levothyroxine (SYNTHROID) 150 MCG tablet Take 150 mcg by mouth daily before breakfast.   Yes [provider]  metFORMIN (GLUCOPHAGE) 500 MG tablet Take 500 mg by mouth 2 (two) times daily with a meal.   Yes [provider]  metoprolol succinate (TOPROL-XL) 50 MG 24 hr tablet Take 50 mg by mouth daily. Take with or immediately following a  meal.   Yes [provider]  Olmesartan-Amlodipine-HCTZ 40-10-25 MG TABS Take 1 tablet by mouth 3 (three) times a week. Mon, Wed, Fri   Yes [provider]  ondansetron (ZOFRAN-ODT) 4 MG disintegrating tablet Take 1 tablet (4 mg total) by mouth every 8 (eight) hours as needed for nausea or vomiting. 06/05/22  Yes Arta Silence, MD  potassium chloride (K-DUR) 10 MEQ tablet Take 10 mEq by mouth daily.   Yes [provider]  sertraline (ZOLOFT) 100 MG tablet Take 100 mg by mouth daily.   Yes [provider]  simvastatin (ZOCOR) 20 MG tablet Take 20 mg by mouth daily.   Yes [provider]  venlafaxine (EFFEXOR) 37.5 MG tablet Take 37.5 mg by mouth daily.   Yes [provider]  sodium chloride flush (NS) 0.9 % SOLN 5 mLs by Intracatheter route every 8 (eight) hours. 05/30/22 08/28/22  Benjamine Sprague, DO    Scheduled Meds:  sodium chloride   Intravenous Once   amiodarone  200 mg Oral Daily   aspirin  81 mg Oral Daily   atorvastatin  40 mg Oral QHS   Chlorhexidine Gluconate Cloth  6 each Topical Daily   feeding supplement  237 mL Oral TID BM   furosemide  40 mg Intravenous Q12H   gabapentin  300 mg Oral BID   insulin aspart  0-20 Units Subcutaneous Q4H   insulin aspart  5 Units Subcutaneous TID WC   levalbuterol  0.63 mg Nebulization Q6H   And   ipratropium  0.5 mg Nebulization Q6H   methylPREDNISolone (SOLU-MEDROL) injection  40 mg Intravenous Q12H   metoprolol tartrate  25 mg Oral BID   multivitamin with minerals  1 tablet Oral Daily   mouth rinse  15 mL Mouth Rinse 4 times per day   pantoprazole (PROTONIX) IV  40 mg Intravenous QHS   sertraline  100 mg Oral Daily   sodium chloride flush  10-40 mL Intracatheter Q12H   venlafaxine  37.5 mg Oral Q breakfast   Continuous Infusions:  meropenem (MERREM) IV 1 g (07/26/22 0333)   PRN Meds:.ALPRAZolam, HYDROmorphone (DILAUDID) injection, ipratropium-albuterol, ondansetron **OR** ondansetron  (ZOFRAN) IV, mouth rinse, oxyCODONE-acetaminophen, sodium chloride flush   Resolved  Hospital Problems   Acute kidney injury    Assessment & Plan:   Sepsis secondary to suspected intraabdominal Infection & UTI, concern for developing HCAP Complicated Diverticulitis s/p robotic sigmoidectomy converted to open Hartman's procedure on 07/19/22 with possible intraabdominal infection  - Trend WBC and monitor fever curve  - Continue meropenem  - Urine culture positive for yeast 08/26: pt received 1x dose of diflucan on 08/27  - PRN antiemetic and pain management - Management per general surgery  NSTEMI  Acute Decompensated HFrEF (LVEF 45-50% on 07/20/22) New onset atrial fibrillation with rvr  Shock: Septic +/- Cardiogenic ~ RESOLVED  Echo 08/23: EF 45 to 50%, left ventricle demonstrates global hypokinesis  - Continuous cardiac monitoring - Maintain map >65 - Heparin gtt discontinued due to worsening anemia  - Diuresis as BP and renal function permits ~ currently on 40 mg IV Lasix q12h - Continue po amiodarone & metoprolol for rate control  - Continue aspirin and atorvastatin  - Cardiology following appreciate input: considering further ischemic work-up pending clinical course, but favoring conservative management   Acute Hypoxic Respiratory Failure in the setting of pulmonary edema & suspected developing HCAP - Supplemental O2 as needed to maintain O2 sats >92% - BiPAP as needed - High risk for intubation - Follow intermittent Chest X-ray & ABG as needed - Scheduled and prn bronchodilator therapy   - ABX as above (broadened to Meropenem 8/27) - Continue iv steroids 40 mg q12hrs - Aggressive Pulmonary toilet as able  Normocytic Normochromic Anemia without s/sx of overt blood loss - Monitor for S/Sx of bleeding - Trend CBC - Heparin gtt discontinued 08/28  Mild Hypokalemia Hypophosphatemia  Lactic Acidosis (3.4 ~2.3 ~) - Trend BMP  - Replace electrolytes as indicated  - Monitor  UOP - Avoid nephrotoxic medications     Type II Diabetes mellitus - CBG's q4hrs - SSI - Hemoglobin A1C pending   Hypothyroidism - Continue Synthroid   Pt is critically ill, high risk for further decompensation, need for intubation, cardiac arrest and death.  Discussed GOC with pt, she confirms Full Code status and that she would want to be intubated if her respiratory status should worsen.  Will consult Palliative Care to assist with further [place] discussions.    Best practice:  Diet: Full Liquid Pain/Anxiety/Delirium protocol (if indicated): Yes  VAP protocol (if indicated): Not indicated DVT prophylaxis: SCD's GI prophylaxis: PPI Glucose control:  SSI Yes Central venous access: PICC line Arterial line:  N/A Foley:  Yes, and it is still needed Mobility:  Mobilize as able   PT consulted: N/A Last date of multidisciplinary goals of care discussion [07/26/22] Code Status:  full code Disposition: ICU  Updated pt regarding plan of care and all questions were answered.    Care time: 30 minutes     Donell Beers, [place] Pager [phone] (please enter 7 digits) PCCM Consult Pager [phone] (please enter 7 digits)

## 2022-07-27 DIAGNOSIS — K5792 Diverticulitis of intestine, part unspecified, without perforation or abscess without bleeding: Secondary | ICD-10-CM | POA: Diagnosis not present

## 2022-07-27 LAB — BASIC METABOLIC PANEL
Anion gap: 5 (ref 5–15)
BUN: 39 mg/dL — ABNORMAL HIGH (ref 8–23)
CO2: 29 mmol/L (ref 22–32)
Calcium: 7.8 mg/dL — ABNORMAL LOW (ref 8.9–10.3)
Chloride: 106 mmol/L (ref 98–111)
Creatinine, Ser: 0.68 mg/dL (ref 0.44–1.00)
GFR, Estimated: >60 mL/min (ref >60)
Glucose, Bld: 239 mg/dL — ABNORMAL HIGH (ref 70–99)
Potassium: 4.1 mmol/L (ref 3.5–5.1)
Sodium: 140 mmol/L (ref 135–145)

## 2022-07-27 LAB — MAGNESIUM: Magnesium: 1.8 mg/dL (ref 1.7–2.4)

## 2022-07-27 LAB — ECHOCARDIOGRAM LIMITED
Calc EF: 21.8 %
Height: 62.992 in
S' Lateral: 4.8 cm
Single Plane A2C EF: 22.9 %
Single Plane A4C EF: 22.5 %
Weight: 2479.73 oz

## 2022-07-27 LAB — GLUCOSE, CAPILLARY
Glucose-Capillary: 116 mg/dL — ABNORMAL HIGH (ref 70–99)
Glucose-Capillary: 128 mg/dL — ABNORMAL HIGH (ref 70–99)
Glucose-Capillary: 171 mg/dL — ABNORMAL HIGH (ref 70–99)
Glucose-Capillary: 192 mg/dL — ABNORMAL HIGH (ref 70–99)
Glucose-Capillary: 203 mg/dL — ABNORMAL HIGH (ref 70–99)
Glucose-Capillary: 88 mg/dL (ref 70–99)

## 2022-07-27 LAB — CBC
HCT: 29.3 % — ABNORMAL LOW (ref 36.0–46.0)
Hemoglobin: 8.9 g/dL — ABNORMAL LOW (ref 12.0–15.0)
MCH: 27.9 pg (ref 26.0–34.0)
MCHC: 30.4 g/dL (ref 30.0–36.0)
MCV: 91.8 fL (ref 80.0–100.0)
Platelets: 306 10*3/uL (ref 150–400)
RBC: 3.19 MIL/uL — ABNORMAL LOW (ref 3.87–5.11)
RDW: 18.5 % — ABNORMAL HIGH (ref 11.5–15.5)
WBC: 12.5 10*3/uL — ABNORMAL HIGH (ref 4.0–10.5)
nRBC: 0.2 % (ref 0.0–0.2)

## 2022-07-27 LAB — PHOSPHORUS: Phosphorus: 2.9 mg/dL (ref 2.5–4.6)

## 2022-07-27 MED ORDER — LISINOPRIL 5 MG PO TABS
2.5000 mg | ORAL_TABLET | Freq: Every day | ORAL | Status: DC
  Administered 2022-07-27 – 2022-08-01 (×6): 2.5 mg via ORAL
  Filled 2022-07-27 (×6): qty 1

## 2022-07-27 MED ORDER — ACETAZOLAMIDE SODIUM 500 MG IJ SOLR
500.0000 mg | Freq: Once | INTRAMUSCULAR | Status: AC
  Administered 2022-07-27: 500 mg via INTRAVENOUS
  Filled 2022-07-27: qty 500

## 2022-07-27 NOTE — Inpatient Diabetes Management (Signed)
Inpatient Diabetes Program Recommendations  AACE/ADA: New Consensus Statement on Inpatient Glycemic Control  Target Ranges:  Prepandial:   less than 140 mg/dL      Peak postprandial:   less than 180 mg/dL (1-2 hours)      Critically ill patients:  140 - 180 mg/dL    Latest Reference Range & Units 07/27/22 03:42 07/27/22 08:04  Glucose-Capillary 70 - 99 mg/dL 203 (H) 192 (H)    Latest Reference Range & Units 07/26/22 07:59 07/26/22 11:08 07/26/22 15:14 07/26/22 19:40 07/26/22 23:38  Glucose-Capillary 70 - 99 mg/dL 171 (H) 174 (H) 163 (H) 207 (H) 193 (H)   Review of Glycemic Control  Diabetes history: DM2 Outpatient Diabetes medications: Metformin 500 mg BID, Amaryl 4 mg daily Current orders for Inpatient glycemic control: Novolog 0-20 units TID with meals, Novolog 5 units TID with meals; Solumedrol 40 mg Q12H  Inpatient Diabetes Program Recommendations:    Insulin: If steroids are continued as ordered, please consider ordering Semglee 5 units Q24H.  Thanks, Barnie Alderman, RN, MSN, [place] Diabetes Coordinator Inpatient Diabetes Program [phone] (Team Pager from 8am to [place])

## 2022-07-27 NOTE — Progress Notes (Addendum)
Patient was seen evaluated and examined by me and the PA on 07/27/22.  Course of action, evaluation, and management decisions were developed solely by me, but detailed below in the PA's note.  [place] NOTE       Patient ID: Sarah [name] MRN: [id number] DOB/AGE: 76-21-1947 76 y.o.  Admit date: 07/19/2022 Referring Physician Donell Beers, NP  Primary Physician Dr. Sabra Heck Primary Cardiologist Dr. Saralyn Pilar  Reason for Consultation elevated troponin & abnormal EKG, concern for perioperative NSTEMI  HPI: Sarah [name] is a 76yoF with a PMH of hypertension, Graves' disease s/p XRT, type 2 diabetes, IBS, diverticulitis with abscess formation s/p CT-guided drain placement in early June 2023 who presented to [facility] for scheduled laparoscopic sigmoidectomy on 07/19/2022 that was ultimately converted to an open Hartman's procedure, now s/p colostomy. The evening following the procedure she reportedly became hypotensive and tachycardic with SBP in the 90s.  CCM was consulted and the patient was transferred to the ICU early morning of 8/23. Cardiology is consulted the afternoon of 8/23 due to concern for an abnormal EKG and elevated troponin which peaked at 13,300 on 8/24.  Echo shows a newly reduced ejection fraction to 45-50%, consistent with perioperative NSTEMI.  She also developed atrial fibrillation with RVR on 8/24.  Interval History: -Remains on HFNC at 40L/54%, only complaint is fatigue -feels short of breath "sometimes", no chest pain, palpitations -repeat limited echo resulted with reduced EF 25-30% with severely decreased LV function, global hypo (8/23 - EF 45-50%) -firm in her wishes to continue aggressive therapy  -tolerating diet   Review of systems complete and found to be negative unless listed above    Past Medical History:  Diagnosis Date   Arthritis    hands   Cancer ([place])    thyroid- radiation   Diabetes mellitus without complication ([place])    Hyperlipidemia     Hypertension    Hypothyroidism    IBS (irritable bowel syndrome)     Past Surgical History:  Procedure Laterality Date   ABDOMINAL HYSTERECTOMY     CATARACT EXTRACTION W/PHACO Left 12/21/2016   Procedure: CATARACT EXTRACTION PHACO AND INTRAOCULAR LENS PLACEMENT ([place])  left eye;  Surgeon: Leandrew Koyanagi, MD;  Location: [facility];  Service: Ophthalmology;  Laterality: Left;  Diabetic - oral meds   CATARACT EXTRACTION W/PHACO Right 01/25/2017   Procedure: CATARACT EXTRACTION PHACO AND INTRAOCULAR LENS PLACEMENT ([place])  right diabetic;  Surgeon: Leandrew Koyanagi, MD;  Location: [facility];  Service: Ophthalmology;  Laterality: Right;  diabetic - oral meds   CHOLECYSTECTOMY     COLONOSCOPY WITH PROPOFOL N/A 10/26/2016   Procedure: COLONOSCOPY WITH PROPOFOL;  Surgeon: Manya Silvas, MD;  Location[facility] ENDOSCOPY;  Service: Endoscopy;  Laterality: N/A;   IR RADIOLOGIST EVAL & MGMT  06/14/2022   KNEE SURGERY Right     Medications Prior to Admission  Medication Sig Dispense Refill Last Dose   cyanocobalamin 1000 MCG tablet Take 1,000 mcg by mouth daily.   07/18/2022   glimepiride (AMARYL) 4 MG tablet Take 4 mg by mouth daily with breakfast.   07/18/2022   levothyroxine (SYNTHROID) 150 MCG tablet Take 150 mcg by mouth daily before breakfast.   07/19/2022 at 0730   metFORMIN (GLUCOPHAGE) 500 MG tablet Take 500 mg by mouth 2 (two) times daily with a meal.      metoprolol succinate (TOPROL-XL) 50 MG 24 hr tablet Take 50 mg by mouth daily. Take with or immediately following a meal.  07/19/2022 at 0730   Olmesartan-Amlodipine-HCTZ 40-10-25 MG TABS Take 1 tablet by mouth 3 (three) times a week. Mon, Wed, Fri   07/19/2022 at 0730   ondansetron (ZOFRAN-ODT) 4 MG disintegrating tablet Take 1 tablet (4 mg total) by mouth every 8 (eight) hours as needed for nausea or vomiting. 12 tablet 0 07/19/2022 at 0200   potassium chloride (K-DUR) 10 MEQ tablet Take 10 mEq by mouth daily.    07/18/2022   sertraline (ZOLOFT) 100 MG tablet Take 100 mg by mouth daily.      simvastatin (ZOCOR) 20 MG tablet Take 20 mg by mouth daily.   Past Week   venlafaxine (EFFEXOR) 37.5 MG tablet Take 37.5 mg by mouth daily.   07/19/2022 at 0730   sodium chloride flush (NS) 0.9 % SOLN 5 mLs by Intracatheter route every 8 (eight) hours. 1350 mL 0    Social History   Socioeconomic History   Marital status: Married    Spouse name: Not on file   Number of children: Not on file   Years of education: Not on file   Highest education level: Not on file  Occupational History   Not on file  Tobacco Use   Smoking status: Never   Smokeless tobacco: Never   Tobacco comments:    social as teenager  Vaping Use   Vaping Use: Never used  Substance and Sexual Activity   Alcohol use: No   Drug use: No   Sexual activity: Not on file  Other Topics Concern   Not on file  Social History Narrative   Not on file   Social Determinants of Health   Financial Resource Strain: Not on file  Food Insecurity: Not on file  Transportation Needs: Not on file  Physical Activity: Not on file  Stress: Not on file  Social Connections: Not on file  Intimate Partner Violence: Not on file    Family History  Problem Relation Age of Onset   Breast cancer Neg Hx     PHYSICAL EXAM General: Elderly and frail-appearing Caucasian female, in no acute distress, sitting in incline in ICU bed. HEENT:  Normocephalic and atraumatic. Neck:  No JVD.  Lungs: Somewhat short of breath appearing on high flow nasal cannula coarse breath sounds to auscultation throughout, poor air movement  Heart: Regular rate and rhythm, normal S1 and S2 without gallops or murmurs.  Abdomen: Non-distended appearing.   Msk: Normal strength and tone for age. Extremities: feet cool to the touch. No clubbing, cyanosis.  Improved upper extremity edema, no peripheral edema.   Neuro: Alert and oriented X 3. Psych:  Answers questions appropriately.    Labs: Basic Metabolic Panel: Recent Labs    07/26/22 0458 07/27/22 0501  NA 142 140  K 4.3 4.1  CL 108 106  CO2 28 29  GLUCOSE 125* 239*  BUN 31* 39*  CREATININE 0.55 0.68  CALCIUM 7.9* 7.8*  MG 2.0 1.8  PHOS 2.9 2.9   Liver Function Tests: Recent Labs    07/25/22 0310 07/25/22 0344  AST 32 34  ALT 18 19  ALKPHOS 36* 38  BILITOT 0.7 0.8  PROT 4.9* 5.5*  ALBUMIN 2.4* 2.8*   No results for input(s): "LIPASE", "AMYLASE" in the last 72 hours. CBC: Recent Labs    07/26/22 0458 07/27/22 0854  WBC 10.5 12.5*  HGB 8.2* 8.9*  HCT 26.5* 29.3*  MCV 89.8 91.8  PLT 294 306   Cardiac Enzymes: No results for input(s): "CKTOTAL", "CKMB", "CKMBINDEX", "TROPONINIHS"  in the last 72 hours.  BNP: Invalid input(s): "POCBNP" D-Dimer: No results for input(s): "DDIMER" in the last 72 hours. Hemoglobin A1C: No results for input(s): "HGBA1C" in the last 72 hours. Fasting Lipid Panel: Recent Labs    07/25/22 0310  TRIG 58   Thyroid Function Tests: No results for input(s): "TSH", "T4TOTAL", "T3FREE", "THYROIDAB" in the last 72 hours.  Invalid input(s): "FREET3" Anemia Panel: No results for input(s): "VITAMINB12", "FOLATE", "FERRITIN", "TIBC", "IRON", "RETICCTPCT" in the last 72 hours.   ECHOCARDIOGRAM LIMITED  Result Date: 07/27/2022    ECHOCARDIOGRAM LIMITED REPORT   Patient Name:   Sarah [name] Date of Exam: 07/26/2022 Medical Rec #:  [id number]   Height:       63.0 in Accession #:    [id number]  Weight:       155.0 lb Date of Birth:  [birth year]   BSA:          1.735 m Patient Age:    33 years    BP:           123/77 mmHg Patient Gender: F           HR:           98 bpm. Exam Location:  [facility] Procedure: Limited Echo, Cardiac Doppler and Color Doppler Indications:     NSTEMI I21.4  History:         Patient has prior history of Echocardiogram examinations, most                  recent 06/19/2022. Risk Factors:Hypertension and Diabetes.                  Cancer.  Sonographer:      Sherrie Sport Referring Phys:  [id number] [place] TANG Diagnosing Phys: Yolonda Kida MD IMPRESSIONS  1. Left ventricular ejection fraction, by estimation, is 25 to 30%. The left ventricle has severely decreased function. The left ventricle demonstrates global hypokinesis. The left ventricular internal cavity size was mildly dilated. Left ventricular diastolic function could not be evaluated.  2. The right ventricular size is mildly enlarged.  3. Left atrial size was mildly dilated.  4. Right atrial size was mildly dilated.  5. The mitral valve is normal in structure. FINDINGS  Left Ventricle: Left ventricular ejection fraction, by estimation, is 25 to 30%. The left ventricle has severely decreased function. The left ventricle demonstrates global hypokinesis. The left ventricular internal cavity size was mildly dilated. There is no left ventricular hypertrophy. Left ventricular diastolic function could not be evaluated. Right Ventricle: The right ventricular size is mildly enlarged. No increase in right ventricular wall thickness. Left Atrium: Left atrial size was mildly dilated. Right Atrium: Right atrial size was mildly dilated. Pericardium: There is no evidence of pericardial effusion. Mitral Valve: The mitral valve is normal in structure. Tricuspid Valve: The tricuspid valve is normal in structure. Tricuspid valve regurgitation is mild. Pulmonic Valve: The pulmonic valve was normal in structure. Pulmonic valve regurgitation is trivial. Aorta: The ascending aorta was not well visualized. LEFT VENTRICLE PLAX 2D LVIDd:         5.20 cm LVIDs:         4.80 cm LV PW:         1.00 cm LV IVS:        1.10 cm  LV Volumes (MOD) LV vol d, MOD A2C: 108.0 ml LV vol d, MOD A4C: 126.0 ml LV vol s, MOD A2C: 83.3 ml LV vol  s, MOD A4C: 97.7 ml LV SV MOD A2C:     24.7 ml LV SV MOD A4C:     126.0 ml LV SV MOD BP:      25.7 ml LEFT ATRIUM         Index LA diam:    4.70 cm 2.71 cm/m   AORTA Ao Root diam: 2.90 cm TRICUSPID VALVE  TR Peak grad:   30.9 mmHg TR Vmax:        278.00 cm/s Yolonda Kida MD Electronically signed by Yolonda Kida MD Signature Date/Time: 07/27/2022/7:41:23 AM    Final    DG Chest Port 1 View  Result Date: 07/26/2022 CLINICAL DATA:  Acute respiratory failure and hypoxia. EXAM: PORTABLE CHEST 1 VIEW COMPARISON:  07/25/2022 FINDINGS: There is a right arm PICC line with tip in the right atrium. Stable cardiomediastinal contours. Bilateral multifocal airspace opacities are unchanged in the interval. No new findings. IMPRESSION: No change in aeration to the lungs compared with previous exam. Electronically Signed   By: Kerby Moors M.D.   On: 07/26/2022 11:05     Radiology: ECHOCARDIOGRAM LIMITED  Result Date: 07/27/2022    ECHOCARDIOGRAM LIMITED REPORT   Patient Name:   MAYUMI SUMMERSON [place] Date of Exam: 07/26/2022 Medical Rec #:  [id number]   Height:       63.0 in Accession #:    [id number]  Weight:       155.0 lb Date of Birth:  December 25, 1945   BSA:          1.735 m Patient Age:    26 years    BP:           123/77 mmHg Patient Gender: F           HR:           98 bpm. Exam Location:  [facility] Procedure: Limited Echo, Cardiac Doppler and Color Doppler Indications:     NSTEMI I21.4  History:         Patient has prior history of Echocardiogram examinations, most                  recent 06/19/2022. Risk Factors:Hypertension and Diabetes.                  Cancer.  Sonographer:     Sherrie Sport Referring Phys:  [id number] [place] TANG Diagnosing Phys: Yolonda Kida MD IMPRESSIONS  1. Left ventricular ejection fraction, by estimation, is 25 to 30%. The left ventricle has severely decreased function. The left ventricle demonstrates global hypokinesis. The left ventricular internal cavity size was mildly dilated. Left ventricular diastolic function could not be evaluated.  2. The right ventricular size is mildly enlarged.  3. Left atrial size was mildly dilated.  4. Right atrial size was mildly dilated.  5. The mitral valve is  normal in structure. FINDINGS  Left Ventricle: Left ventricular ejection fraction, by estimation, is 25 to 30%. The left ventricle has severely decreased function. The left ventricle demonstrates global hypokinesis. The left ventricular internal cavity size was mildly dilated. There is no left ventricular hypertrophy. Left ventricular diastolic function could not be evaluated. Right Ventricle: The right ventricular size is mildly enlarged. No increase in right ventricular wall thickness. Left Atrium: Left atrial size was mildly dilated. Right Atrium: Right atrial size was mildly dilated. Pericardium: There is no evidence of pericardial effusion. Mitral Valve: The mitral valve is normal in structure. Tricuspid Valve: The tricuspid valve is normal  in structure. Tricuspid valve regurgitation is mild. Pulmonic Valve: The pulmonic valve was normal in structure. Pulmonic valve regurgitation is trivial. Aorta: The ascending aorta was not well visualized. LEFT VENTRICLE PLAX 2D LVIDd:         5.20 cm LVIDs:         4.80 cm LV PW:         1.00 cm LV IVS:        1.10 cm  LV Volumes (MOD) LV vol d, MOD A2C: 108.0 ml LV vol d, MOD A4C: 126.0 ml LV vol s, MOD A2C: 83.3 ml LV vol s, MOD A4C: 97.7 ml LV SV MOD A2C:     24.7 ml LV SV MOD A4C:     126.0 ml LV SV MOD BP:      25.7 ml LEFT ATRIUM         Index LA diam:    4.70 cm 2.71 cm/m   AORTA Ao Root diam: 2.90 cm TRICUSPID VALVE TR Peak grad:   30.9 mmHg TR Vmax:        278.00 cm/s Yolonda Kida MD Electronically signed by Yolonda Kida MD Signature Date/Time: 07/27/2022/7:41:23 AM    Final    DG Chest Port 1 View  Result Date: 07/26/2022 CLINICAL DATA:  Acute respiratory failure and hypoxia. EXAM: PORTABLE CHEST 1 VIEW COMPARISON:  07/25/2022 FINDINGS: There is a right arm PICC line with tip in the right atrium. Stable cardiomediastinal contours. Bilateral multifocal airspace opacities are unchanged in the interval. No new findings. IMPRESSION: No change in aeration  to the lungs compared with previous exam. Electronically Signed   By: Kerby Moors M.D.   On: 07/26/2022 11:05   DG Chest Port 1 View  Result Date: 07/25/2022 CLINICAL DATA:  Acute respiratory failure EXAM: PORTABLE CHEST 1 VIEW COMPARISON:  07/24/2022 FINDINGS: Cardiac shadow is enlarged but stable. Right PICC is seen deep in the right atrium but stable. Patchy airspace opacities are again identified bilaterally but mildly improved when compared with the prior study. No bony abnormality is noted. IMPRESSION: Slight improved aeration when compared with the previous exam. Electronically Signed   By: Inez Catalina M.D.   On: 07/25/2022 04:10   DG Chest Port 1 View  Result Date: 07/24/2022 CLINICAL DATA:  Acute respiratory failure EXAM: PORTABLE CHEST 1 VIEW COMPARISON:  July 23, 2022 FINDINGS: A right PICC line terminates near the caval atrial junction. The cardiomediastinal silhouette is stable. Bilateral pulmonary opacities, particularly in the perihilar regions, have worsened. Probable skin fold over the lateral right lung with lung markings on both sides. No convincing evidence of pneumothorax. Decreased lung volumes. No other acute abnormalities. IMPRESSION: 1. The linear stripe over the lateral right mid chest is likely a skin fold as there are lung markings on both sides. No convincing evidence of pneumothorax. Recommend attention on follow-up. 2. Stable right PICC line. 3. Increasing bilateral perihilar opacities. While pulmonary edema is possible, the findings could represent a multifocal infectious process/pneumonia. Recommend clinical correlation and attention on follow-up. Electronically Signed   By: Dorise Bullion III M.D.   On: 07/24/2022 07:52   DG Chest Port 1 View  Result Date: 07/23/2022 CLINICAL DATA:  Hypoxic respiratory failure. EXAM: PORTABLE CHEST 1 VIEW COMPARISON:  Portable chest yesterday at 8:27 a.m. FINDINGS: 4:46 a.m. Right PICC tip remains in the upper right atrium.  Moderate cardiomegaly is stable. Today there is increased perihilar vascular congestion and central interstitial edema. There are stable small pleural  effusions and overlying consolidation or atelectasis, denser on the left. There is increased perihilar opacity on the right which could be atelectasis, infiltrate or ground-glass edema. The more cephalad lungs remain clear. In all other respects no further changes. There is no pneumothorax.  Aortic atherosclerosis. IMPRESSION: 1. Increased perihilar vascular congestion and mild central interstitial edema. 2. Similar layering pleural effusions and overlying lung opacities. 3. Increased right mid perihilar opacity. Electronically Signed   By: Telford Nab M.D.   On: 07/23/2022 06:21   DG Chest Port 1 View  Result Date: 07/22/2022 CLINICAL DATA:  Status post sigmoid colectomy. EXAM: PORTABLE CHEST 1 VIEW COMPARISON:  July 21, 2022. FINDINGS: Stable cardiomegaly. Interval placement of right-sided PICC line with distal tip in expected position of cavoatrial junction. Mild bibasilar subsegmental atelectasis is noted. Bony thorax is unremarkable. IMPRESSION: Interval placement of right-sided PICC line. Mild bibasilar subsegmental atelectasis is noted with probable minimal pleural effusions. Electronically Signed   By: Marijo Conception M.D.   On: 07/22/2022 08:47   [place] EKG SITE RITE  Result Date: 07/21/2022 If Site Rite image not attached, placement could not be confirmed due to current cardiac rhythm.  DG Abd 1 View  Result Date: 07/21/2022 CLINICAL DATA:  [id number].  Abdominal pain EXAM: ABDOMEN - 1 VIEW COMPARISON:  06/21/2015 FINDINGS: Multiple surgical skin staples overlie the lower abdomen. Normal abdominal gas pattern. No gross free intraperitoneal gas. Cholecystectomy clips are seen in the right upper quadrant. Surgical drain overlies the mid pelvis. No organomegaly. Osseous structures are age-appropriate. IMPRESSION: Normal abdominal gas pattern.  Electronically Signed   By: Fidela Salisbury M.D.   On: 07/21/2022 00:36   DG Chest 1 View  Result Date: 07/21/2022 CLINICAL DATA:  Abdominal pain.  [id number] EXAM: CHEST  1 VIEW COMPARISON:  02/23/2021 FINDINGS: Lungs volumes are small, but are symmetric and are clear. No pneumothorax or pleural effusion. Cardiac size within normal limits. Pulmonary vascularity is normal. Osseous structures are age-appropriate. No acute bone abnormality. IMPRESSION: No active disease. Electronically Signed   By: Fidela Salisbury M.D.   On: 07/21/2022 00:35   ECHOCARDIOGRAM COMPLETE  Result Date: 07/20/2022    ECHOCARDIOGRAM REPORT   Patient Name:   Sarah [name] Date of Exam: 07/20/2022 Medical Rec #:  [id number]   Height:       63.0 in Accession #:    [id number]  Weight:       122.0 lb Date of Birth:  01/29/1946   BSA:          1.567 m Patient Age:    75 years    BP:           105/73 mmHg Patient Gender: F           HR:           126 bpm. Exam Location:  [facility] Procedure: 2D Echo, Color Doppler and Cardiac Doppler Indications:     I21.4 NSTEMI  History:         Patient has no prior history of Echocardiogram examinations.                  Arrythmias:Tachycardia; Risk Factors:Hypertension, Diabetes and                  Dyslipidemia.  Sonographer:     Charmayne Sheer Referring Phys:  [id number] [place] TANG Diagnosing Phys: Yolonda Kida MD  Sonographer Comments: No subcostal window. IMPRESSIONS  1. Left ventricular ejection fraction, by estimation, is  45 to 50%. The left ventricle has mildly decreased function. The left ventricle demonstrates global hypokinesis. The left ventricular internal cavity size was mildly to moderately dilated. Left ventricular diastolic parameters were normal.  2. Right ventricular systolic function is normal. The right ventricular size is normal.  3. The mitral valve is normal in structure. No evidence of mitral valve regurgitation.  4. The aortic valve is normal in structure. Aortic valve regurgitation is  not visualized. FINDINGS  Left Ventricle: Left ventricular ejection fraction, by estimation, is 45 to 50%. The left ventricle has mildly decreased function. The left ventricle demonstrates global hypokinesis. The left ventricular internal cavity size was mildly to moderately dilated. There is borderline left ventricular hypertrophy. Left ventricular diastolic parameters were normal. Right Ventricle: The right ventricular size is normal. No increase in right ventricular wall thickness. Right ventricular systolic function is normal. Left Atrium: Left atrial size was normal in size. Right Atrium: Right atrial size was normal in size. Pericardium: There is no evidence of pericardial effusion. Mitral Valve: The mitral valve is normal in structure. No evidence of mitral valve regurgitation. Tricuspid Valve: The tricuspid valve is normal in structure. Tricuspid valve regurgitation is trivial. Aortic Valve: The aortic valve is normal in structure. Aortic valve regurgitation is not visualized. Aortic valve mean gradient measures 5.0 mmHg. Aortic valve peak gradient measures 9.0 mmHg. Aortic valve area, by VTI measures 2.19 cm. Pulmonic Valve: The pulmonic valve was normal in structure. Pulmonic valve regurgitation is not visualized. Aorta: The ascending aorta was not well visualized. IAS/Shunts: No atrial level shunt detected by color flow Doppler.  LEFT VENTRICLE PLAX 2D LVIDd:         5.24 cm   Diastology LVIDs:         3.95 cm   LV e' medial:    4.79 cm/s LV PW:         1.53 cm   LV E/e' medial:  17.3 LV IVS:        0.93 cm   LV e' lateral:   7.51 cm/s LVOT diam:     2.00 cm   LV E/e' lateral: 11.1 LV SV:         51 LV SV Index:   32 LVOT Area:     3.14 cm  RIGHT VENTRICLE RV Basal diam:  2.79 cm RV S prime:     26.90 cm/s LEFT ATRIUM           Index        RIGHT ATRIUM           Index LA diam:      3.80 cm 2.42 cm/m   RA Area:     15.10 cm LA Vol (A2C): 33.8 ml 21.57 ml/m  RA Volume:   38.80 ml  24.76 ml/m  AORTIC  VALVE AV Area (Vmax):    1.94 cm AV Area (Vmean):   1.77 cm AV Area (VTI):     2.19 cm AV Vmax:           150.00 cm/s AV Vmean:          110.000 cm/s AV VTI:            0.232 m AV Peak Grad:      9.0 mmHg AV Mean Grad:      5.0 mmHg LVOT Vmax:         92.70 cm/s LVOT Vmean:        61.900 cm/s LVOT VTI:  0.162 m LVOT/AV VTI ratio: 0.70  AORTA Ao Root diam: 3.10 cm MITRAL VALVE MV Area (PHT): 10.25 cm   SHUNTS MV Decel Time: 74 msec     Systemic VTI:  0.16 m MV E velocity: 83.05 cm/s  Systemic Diam: 2.00 cm MV A velocity: 78.65 cm/s MV E/A ratio:  1.06 Judge Duque D Trayquan Kolakowski MD Electronically signed by Yolonda Kida MD Signature Date/Time: 07/20/2022/5:44:47 PM    Final     ECHO (stress echo) 02/14/2017  Ref Range & Units 5 yr ago  LV Ejection Fraction (%)  55   Aortic Valve Regurgitation Grade  none   Aortic Valve Stenosis Grade  none   Mitral Valve Regurgitation Grade  trivial   Mitral Valve Stenosis Grade  none   Tricuspid Valve Regurgitation Grade  trivial   Tricuspid Valve Regurgitation Max Velocity (m/s) m/sec 1.4   Right Ventricle Systolic Pressure (mmHg) mmHg 12.4   Narrative                     CARDIOLOGY DEPARTMENT                   [place], Lewis                      [id number]                    [facility]                 Acct #: [id number]          [address], [place], [place] [address]       Date: 02/14/2017 02:57 PM                                                             Adult Female Age: 24 yrs                     ECHOCARDIOGRAM REPORT                   Outpatient                                                             [id number]            STUDY:Stress Echo        TAPE:                   MD1:  DRANE, ANNA MARIA             ECHO:Yes   DOPPLER:Yes  FILE:0000-000-000       BP: 148/83 mmHg            COLOR:Yes  CONTRAST:No      MACHINE:[place]      Height: 63 in        RV BIOPSY:No  3D:No   SOUND  QLTY:Moderate     Weight: 174 lb           MEDIUM:None                                       BSA: 1.8 m2   ___________________________________________________________________________________________     HISTORY:Chest pain      REASON:Assess, LV function  Indication:R07.89 Other chest pain, I10 Essential (primary) hypertension, R06.02             Shortness of breath  ___________________________________________________________________________________________   STRESS ECHOCARDIOGRAPHY            Protocol:Treadmill ([place])              Drugs:None  Target Heart Rate:128 bpm        Maximum Predicted Heart Rate: 150 bpm   +-------------------+-------------------------+-------------------------+------------+         Stage            Duration (mm:ss)         Heart Rate (bpm)         BP       +-------------------+-------------------------+-------------------------+------------+        RESTING                                           85              148/83     +-------------------+-------------------------+-------------------------+------------+        EXERCISE                4:49                     210                /        +-------------------+-------------------------+-------------------------+------------+        RECOVERY                8:05                      94              176/88     +-------------------+-------------------------+-------------------------+------------+     Stress Duration:4:49 mm:ss    Max Stress H.R.:210 bpm        Target Heart Rate Achieved: Yes   ___________________________________________________________________________________________   WALL SEGMENT CHANGES                         Rest          Stress  Anterior Septum Basal:Normal        Hyperkinetic                    YKD:XIPJAS        Hyperkinetic                 Apical:Normal        Hyperkinetic     Anterior Wall Basal:Normal        Hyperkinetic                    NKN:LZJQBH         Hyperkinetic  Apical:Normal        Hyperkinetic      Lateral Wall Basal:Normal        Hyperkinetic                    ASN:KNLZJQ        Hyperkinetic                 Apical:Normal        Hyperkinetic    Posterior Wall Basal:Normal        Hyperkinetic                    BHA:LPFXTK        Hyperkinetic     Inferior Wall Basal:Normal        Hyperkinetic                    WIO:XBDZHG        Hyperkinetic                 Apical:Normal        Hyperkinetic   Inferior Septum Basal:Normal        Hyperkinetic                    DJM:EQASTM        Hyperkinetic              Resting EF:>55% (Est.)   Stress EF: >55% (Est.)   ___________________________________________________________________________________________   ADDITIONAL FINDINGS    ___________________________________________________________________________________________   STRESS ECG RESULTS                                                ECG Results:Normal   ___________________________________________________________________________________________  ECHOCARDIOGRAPHIC DESCRIPTIONS   LEFT VENTRICLE         Size:Normal  Contraction:Normal    LV Masses:No Masses          HDQ:QIWL   RIGHT VENTRICLE         Size:Normal                Free Wall:Normal  Contraction:Normal                RV Masses:No mass   PERICARDIUM        Fluid:No effusion    _______________________________________________________________________________________  DOPPLER ECHO and OTHER SPECIAL PROCEDURES     Aortic:No AR                      No AS      Mitral:TRIVIAL MR                 No MS            MV Inflow E Vel=nm*        MV Annulus E'Vel=nm*            E/E'Ratio=nm*   Tricuspid:TRIVIAL TR                 No TS            136.0 cm/sec peak TR vel   12.4 mmHg peak RV pressure   Pulmonary:No PR                      No PS     ___________________________________________________________________________________________   ECHOCARDIOGRAPHIC MEASUREMENTS  2D  DIMENSIONS  AORTA             Values      Normal Range      MAIN PA          Values      Normal Range            Annulus:  nm*       [2.1 - 2.5]                PA Main:  nm*       [1.5 - 2.1]          Aorta Sin:  nm*       [2.7 - 3.3]       RIGHT VENTRICLE        ST Junction:  nm*       [2.3 - 2.9]                RV Base:  nm*       [ < 4.2]          Asc.Aorta:  nm*       [2.3 - 3.1]                 RV Mid:  nm*       [ < 3.5]   LEFT VENTRICLE                                        RV Length:  nm*       [ < 8.6]              LVIDd:  nm*       [3.9 - 5.3]       INFERIOR VENA CAVA              LVIDs:  nm*                                 Max. IVC:  nm*       [ <= 2.1]                 FS:  nm*       [> 25]                    Min. IVC:  nm*                SWT:  nm*       [0.5 - 0.9]                   ------------------                PWT:  nm*       [0.5 - 0.9]                   nm* - not measured   LEFT ATRIUM            LA Diam:  nm*       [2.7 - 3.8]        LA A4C Area:  nm*       [ < 20]          LA Volume:  nm*       [22 - 52]    ___________________________________________________________________________________________  INTERPRETATION  Normal Stress Echocardiogram  NORMAL RIGHT VENTRICULAR SYSTOLIC FUNCTION  TRIVIAL REGURGITATION NOTED (See above)  NO VALVULAR STENOSIS NOTED    ___________________________________________________________________________________________  Electronically signed by: MD Miquel Dunn on 02/15/2017 10:07 AM              Performed By: Johnathan Hausen, RDCS, RVT        Ordering Physician: Clabe Seal   TELEMETRY reviewed by me (LT) 07/27/2022 : Sinus rhythm rate 80s to 90s   EKG reviewed by me and Dr. Saralyn Pilar: 8/23 sinus tachycardia with rate 118 nonspecific T wave changes, repeat at 12:00 without significant changes. 1700 on 8/23, AF RVR with nonspecific ST changes but no stemi per Dr. Saralyn Pilar  Data reviewed by  me (LT) 07/27/2022: Critical care note, surgery note, CBC, BMP, chest x-ray, telemetry, procalcitonin, surgical pathology  ASSESSMENT AND PLAN:  DANAIYA STEADMAN is a 10yoF with a PMH of hypertension, Graves' disease s/p XRT, type 2 diabetes, IBS, diverticulitis with abscess formation s/p CT-guided drain placement in early June 2023 who presented to [facility] for scheduled laparoscopic sigmoidectomy on 07/19/2022 that was ultimately converted to an open Hartman's procedure, now s/p colostomy. The evening following the procedure she reportedly became hypotensive and tachycardic with SBP in the 90s.  CCM was consulted and the patient was transferred to the ICU early morning of 8/23. Cardiology is consulted the afternoon of 8/23 due to concern for an abnormal EKG and elevated troponin which peaked at 13,300 on 8/24.  Echo shows a newly reduced ejection fraction to 45-50%, consistent with perioperative NSTEMI.  She also developed atrial fibrillation with RVR on 8/24.  #Sepsis 2/2 suspected intra-abdominal infection vs UTI #Diverticulitis s/p robotic sigmoidectomy converted to open Hartman's procedure on 07/19/22 #Invasive colorectal adenocarcinoma -Seen on surgical pathology, oncology work-up pending improvement in her respiratory status per surgery -Appreciate palliative involvement  #acute hypoxic respiratory failure Agree with current therapy per primary team & surgery  #perioperative NSTEMI Underwent scheduled robotic sigmoidectomy for diverticulitis on 8/22 with Dr. Lysle Pearl, the surgery ultimately was converted to an open Hartman's procedure and she is now s/p colostomy.  Overnight on the floor she became hypotensive and tachycardic, and was transferred to the ICU for closer monitoring.  Ultimately she did not require vasopressor support and blood pressures been stable throughout the day.  A troponin was checked which was significantly elevated at 7000, and repeat at 9500.  EKGs read as acute MI by the computer,  confirmed with Dr. Clayborn Bigness not to be a STEMI, but with nonspecific T wave changes and poor R wave progression, overall not significantly changed from prior in July 2023. -Concern for perioperative NSTEMI with troponin peak at 13,300 and newly reduced ejection fraction & LV global hypokinesis -S/p 325 mg aspirin, continue 81 mg aspirin daily  -hold heparin GTT with drop in H&H and ongoing anemia -S/p Neo-Synephrine -continue metoprolol tartrate '25mg'$  BID  -Hold home antihypertensives irbesartan 150 mg, amlodipine 10 mg and HCTZ 12.'5mg'$  -Continue atorvastatin 40 mg once daily -Monitor and replete electrolytes for a magnesium >2, potassium >4 -Continuous monitoring on telemetry while inpatient -ongoing consideration for further ischemic work-up pending clinical course, but continue to favor conservative management from a cardiac standpoint, per Dr. Clayborn Bigness - especially with anemia and tenuous respiratory status   #new HFrEF (LVEF 25-30%, on 8/23 was 45-50%, global hypo, prev 55% 2018) Net +7 L since admission, TPN started the night of 8/24. BNP elevated at 1600, increasing to 3100. Somewhat edematous appearing in her upper extremities,  although improving.  Chest x-ray without much change in aeration from 8/28 to 8/29, bilateral multifocal airspace opacities still present. - s/p IV lasix '20mg'$  x 2, '40mg'$  x 10 doses.  - continue lasix '40mg'$  IV BID for now - agree with acetazolamide  - continue GDMT with metoprolol '25mg'$  BID, add lisinopril 2.'5mg'$  today. Will continue to escalate pending clinical course  #New onset paroxysmal atrial fibrillation with RVR #hypothyroidism (History of Graves' disease s/p XRT) Developed the evening of 8/23, heart rates in the 160s, s/p amiodarone bolus x2.  -S/p amiodarone gtt.  -continue p.o. amiodarone 200 mg twice daily x 4 days, then 200 mg daily thereafter starting 8/29. -stop heparin GTT with worsening anemia requiring transfusion, continue to hold off restarting this  for now. CHA2DS2-VASc 6 (age, sex, chf, htn, MI) -thyroid panel: Slightly low TSH at 0.368, T4 within normal limits 4.9, T3 uptake slightly elevated at 43%  This patient's plan of care was discussed and created with Dr. Clayborn Bigness and he is in agreement.  Signed: Tristan Schroeder , PA-C 07/27/2022, 1:45 PM [facility] Cardiology

## 2022-07-27 NOTE — Progress Notes (Signed)
NAME:  Sarah [name], MRN:  [id number], DOB:  Dec 26, 1945, LOS: 8 ADMISSION DATE:  07/19/2022, CONSULTATION DATE:  07/20/22 REFERRING MD:  Benjamine Sprague, DO  REASON FOR CONSULT: Hypotension    HPI  Sarah [name] is a 60yoF with a PMH of hypertension, Graves' disease s/p XRT, type 2 diabetes, IBS, diverticulitis with abscess formation s/p CT-guided drain placement in early June 2023 who presented to [facility] for scheduled laparoscopic sigmoidectomy on 07/19/2022.  Hospital Course: Patient underwent robotic assisted laparoscopic sigmoidectomy, converted to open hartman's procedure. Patient was transferred to medical floor post -op. Overnight patient noted to have decreased urine output, hypotensive and tachycardic.  Due to persistent hypotension despite IV fluid resuscitation, patient was started on peripheral Levophed and transferred to the ICU.  PCCM consulted to assist with management. On arrival to the ICU, vital signs showed blood pressure of 98/60 mm Hg, tachycardia of 115 beats per minute, a respiratory rate of 22 breaths per minute, and a temperature of 98.61F (36.7.C).  Patient remained hemodynamically stable not requiring pressors.   Repeat Pertinent Labs Findings: Chemistry:Na+/ K+: 140/2.8 (142/3.3) Glucose:184  BUN/Cr.:23/1.14 (24/1.16) CBC: WBC:5.4 Hgb/Hct:9.6/30.8:  Other Lab findings:   PCT: 1.39 Lactic acid: 3.4 COVID PCR: Negative, Troponin:  6.999>9.494>8.837>11,207 EKG:  sinus tachycardia with rate 118 with premature atrial contractions and premature ventricular contractions, nonspecific ST changes with poor R wave progression.  Pt developed new onset atrial fibrillation with rvr at 1740 heart rate 130-180's.  Amiodarone bolus followed by continuous amiodarone gtt administered.  Pts troponin's trending back up to 11,207 unable to anticoagulate pt s/p robotic sigmoidectomy converted to open Hartman's procedure. Cardiology was consulted. Initially holding heparin per surgery however, patient  noted with worsening symptoms of nausea, Afib RVR with HR's in the 170's despite being on Amiodarone. She was more lethargic drifting in and out of somnolence. Decision made to restart Heparin after further discussion with General surgery due to high risk for decompensation and intubation. PCCM re-consulted.  Past Medical History  Hypertension, Graves' disease s/p XRT, type 2 diabetes, IBS, diverticulitis with abscess formation s/p CT-guided drain placement in early June 2023  [place] [facility] Events   8/22:S/p robotic sigmoidectomy converted to open Hartman's procedure for complicated diverticulitis. 8/23: Transferred to ICU for hypotension. PCCM consulted for pressor.  Developed Afib RVR with elevated troponin concerns for NSTEMI. Cardiology consulted.  Pt received amiodarone bolus followed by amiodarone gtt.  Overnight required additional amiodarone bolus due to recurrent atrial fibrillation with rvr hr 170's and pt symptomatic.  8/24: Pt remains on amiodarone gtt 30 mg/hr and heparin gtt converted to nsr hr 101.  Requiring neo-synephrine gtt a 10 mcg/min to maintain map >65 8/25: Weaned off pressors.  Concern for developing pulmonary edema, stop IVF and diurese. Remains in A-fib (HF mostly 100-110's) requiring Amiodarone gtt. 8/26: Remains off pressors, overnight required BiPAP due to SOB but weaned off this am, A-fib with RVR which responded to IV metoprolol 8/27: Worsening respiratory status, on BiPAP, high risk for intubation.  ABX broadened to Meropenem due to concern for developing HCAP.  Aggressive diuresis, start steroids.  Consult Palliative Care for [place] discussions 8/28: Pt tolerated Bipap overnight FiO2 100%.  Transitioned to [facility] '@90'$ %/55L  8/29: No acute events overnight currently on Bipap FiO2 '@50A'$ % with O2 sats 96%.  Will transition to [facility] as tolerated  8/30: No significant events overnight. Remains on HHFNC and Bipap FiO2 '@50'$ % with O2 sats >95% at night  Consults:   PCCM Cardiology General  Surgery Palliative Care  Procedures:  8/22: Robotic assisted laparoscopic sigmoidectomy, converted to open hartman's procedure   Significant Diagnostic Tests:  8/24: Chest Xray>No acute abnormality 8/24: Abdominal xray>No acute abnormality 8/27: CXR>>The linear stripe over the lateral right mid chest is likely a skin fold as there are lung markings on both sides. No convincing evidence of pneumothorax. Recommend attention on follow-up. Stable right PICC line. Increasing bilateral perihilar opacities. While pulmonary edema is possible, the findings could represent a multifocal infectious process/pneumonia. Recommend clinical correlation and attention on follow-up.  Micro Data:  8/24: MRSA PCR>>negative 8/24:Urine>> 40,000 colonies yeast 8/27: MRSA PCR>>negative  8/27: Covid PCR>>negative  8/27: RVP >>negative   Antimicrobials:  Vancomycin 8/24>8/24 Ceftriaxone 8/22>8/27 Metronidazole 8/22 >8/27 Fluconazole 8/27 x1 dose Meropenem 8/27>>  OBJECTIVE  Blood pressure 125/78, pulse 90, temperature 97.6 F (36.4 C), temperature source Oral, resp. rate (!) 21, height 5' 2.99" (1.6 m), weight 70.3 kg, SpO2 98 %.   FiO2 (%):  [40 %-80 %] 40 %   Intake/Output Summary (Last 24 hours) at 07/27/2022 0506 Last data filed at 07/26/2022 1800 Gross per 24 hour  Intake 365.61 ml  Output 800 ml  Net -434.39 ml    Filed Weights   07/23/22 0500 07/26/22 0500 07/26/22 1000  Weight: 70.3 kg 68.9 kg 70.3 kg   Physical Examination  GENERAL: Acutely-ill appearing female, NAD resting in bed on HHFNC 90% 55L  HEENT: Head atraumatic, normocephalic. Oropharynx dry and nasopharynx clear.  Supple, no JVD  LUNGS: Diminished throughout, even, non labored  CARDIOVASCULAR: Sinus rhythm, rrr, no r/g, 2+ radial/2+ distal pulses, no edema  ABDOMEN: Hypoactive BS x4, soft, tender, LLQ ileostomy pink/patent draining brown stool  EXTREMITIES: No pedal edema, cyanosis, or clubbing.   NEUROLOGIC:Alert and oriented, follows commands, PERRLA SKIN: Midline abdominal incision dressing intact; RLQ JP drain draining scant amount of serosanguinous fluid   Labs/imaging that I havepersonally reviewed  (right click and "Reselect all SmartList Selections" daily)     Labs   CBC: Recent Labs  Lab 07/23/22 0244 07/24/22 0109 07/25/22 0310 07/25/22 0332 07/25/22 1120 07/25/22 2332 07/26/22 0458  WBC 12.0* 9.4 8.0 8.6  --   --  10.5  HGB 7.7* 7.4* 5.8* 6.4* 7.7* 7.9* 8.2*  HCT 24.6* 23.8* 18.9* 21.0* 25.3* 25.7* 26.5*  MCV 86.6 86.9 88.7 89.7  --   --  89.8  PLT 325 298 192 226  --   --  294     Basic Metabolic Panel: Recent Labs  Lab 07/23/22 0244 07/23/22 2203 07/24/22 0109 07/24/22 0241 07/25/22 0310 07/25/22 0344 07/26/22 0458  NA 134*  --  139  --  143 139 142  K 3.5  --  3.3*  --  3.5 4.0 4.3  CL 108  --  108  --  110 107 108  CO2 23  --  26  --  '26 27 28  '$ GLUCOSE 195*  --  116*  --  98 106* 125*  BUN 25*  --  21  --  18 21 31*  CREATININE 0.50  --  0.48  --  0.41* 0.39* 0.55  CALCIUM 7.3*  --  7.1*  --  6.4* 7.2* 7.9*  MG 1.8  --   --  1.6* 1.7 2.0 2.0  PHOS 1.6* 2.6  --  2.2* <1.0* 2.0* 2.9    GFR: Estimated Creatinine Clearance: 56.3 mL/min (by C-G formula based on SCr of 0.55 mg/dL). Recent Labs  Lab 07/20/22 2032 07/20/22 2141 07/20/22 2324  07/21/22 0306 07/22/22 0540 07/23/22 0244 07/24/22 0109 07/24/22 7026 07/25/22 0310 07/25/22 0332 07/26/22 0458  PROCALCITON  --    < >  --    < > 0.54  --   --  0.22 0.11  --  0.11  WBC 11.3*  --   --    < > 10.4   < > 9.4  --  8.0 8.6 10.5  LATICACIDVEN 3.4*  --  2.3*  --   --   --   --   --   --   --   --    < > = values in this interval not displayed.     Liver Function Tests: Recent Labs  Lab 07/22/22 0540 07/25/22 0310 07/25/22 0344  AST 47* 32 34  ALT '23 18 19  '$ ALKPHOS 47 36* 38  BILITOT 0.3 0.7 0.8  PROT 5.1* 4.9* 5.5*  ALBUMIN 1.8* 2.4* 2.8*    No results for input(s):  "LIPASE", "AMYLASE" in the last 168 hours. No results for input(s): "AMMONIA" in the last 168 hours.  ABG    Component Value Date/Time   PHART 7.39 07/24/2022 0616   PCO2ART 40 07/24/2022 0616   PO2ART 72 (L) 07/24/2022 0616   HCO3 24.2 07/24/2022 0616   ACIDBASEDEF 0.7 07/24/2022 0616   O2SAT 95.8 07/24/2022 0616     Coagulation Profile: Recent Labs  Lab 07/20/22 2141  INR 1.7*     Cardiac Enzymes: No results for input(s): "CKTOTAL", "CKMB", "CKMBINDEX", "TROPONINI" in the last 168 hours.  HbA1C: Hgb A1c MFr Bld  Date/Time Value Ref Range Status  05/30/2022 03:14 AM 8.3 (H) 4.8 - 5.6 % Final    Comment:    (NOTE)         Prediabetes: 5.7 - 6.4         Diabetes: >6.4         Glycemic control for adults with diabetes: <7.0     CBG: Recent Labs  Lab 07/26/22 1108 07/26/22 1514 07/26/22 1940 07/26/22 2338 07/27/22 0342  GLUCAP 174* 163* 207* 193* 203*     Past Medical History  She,  has a past medical history of Arthritis, Cancer ([place]), Diabetes mellitus without complication ([place]), Hyperlipidemia, Hypertension, Hypothyroidism, and IBS (irritable bowel syndrome).   Surgical History    Past Surgical History:  Procedure Laterality Date   ABDOMINAL HYSTERECTOMY     CATARACT EXTRACTION W/PHACO Left 12/21/2016   Procedure: CATARACT EXTRACTION PHACO AND INTRAOCULAR LENS PLACEMENT ([place])  left eye;  Surgeon: Leandrew Koyanagi, MD;  Location: [place];  Service: Ophthalmology;  Laterality: Left;  Diabetic - oral meds   CATARACT EXTRACTION W/PHACO Right 01/25/2017   Procedure: CATARACT EXTRACTION PHACO AND INTRAOCULAR LENS PLACEMENT ([place])  right diabetic;  Surgeon: Leandrew Koyanagi, MD;  Location: [place];  Service: Ophthalmology;  Laterality: Right;  diabetic - oral meds   CHOLECYSTECTOMY     COLONOSCOPY WITH PROPOFOL N/A 10/26/2016   Procedure: COLONOSCOPY WITH PROPOFOL;  Surgeon: Manya Silvas, MD;  Location[facility] ENDOSCOPY;  Service:  Endoscopy;  Laterality: N/A;   IR RADIOLOGIST EVAL & MGMT  06/14/2022   KNEE SURGERY Right      Social History   reports that she has never smoked. She has never used smokeless tobacco. She reports that she does not drink alcohol and does not use drugs.   Family History   Her family history is negative for Breast cancer.   Allergies Allergies  Allergen Reactions   Penicillins  Anaphylaxis    TOLERATED CEFOTETAN 06/2022   Sulfa Antibiotics Rash     Home Medications  Prior to Admission medications   Medication Sig Start Date End Date Taking? Authorizing Provider  cyanocobalamin 1000 MCG tablet Take 1,000 mcg by mouth daily.   Yes [provider]  glimepiride (AMARYL) 4 MG tablet Take 4 mg by mouth daily with breakfast. 09/29/21 09/29/22 Yes [provider]  levothyroxine (SYNTHROID) 150 MCG tablet Take 150 mcg by mouth daily before breakfast.   Yes [provider]  metFORMIN (GLUCOPHAGE) 500 MG tablet Take 500 mg by mouth 2 (two) times daily with a meal.   Yes [provider]  metoprolol succinate (TOPROL-XL) 50 MG 24 hr tablet Take 50 mg by mouth daily. Take with or immediately following a meal.   Yes [provider]  Olmesartan-Amlodipine-HCTZ 40-10-25 MG TABS Take 1 tablet by mouth 3 (three) times a week. Mon, Wed, Fri   Yes [provider]  ondansetron (ZOFRAN-ODT) 4 MG disintegrating tablet Take 1 tablet (4 mg total) by mouth every 8 (eight) hours as needed for nausea or vomiting. 06/05/22  Yes Arta Silence, MD  potassium chloride (K-DUR) 10 MEQ tablet Take 10 mEq by mouth daily.   Yes [provider]  sertraline (ZOLOFT) 100 MG tablet Take 100 mg by mouth daily.   Yes [provider]  simvastatin (ZOCOR) 20 MG tablet Take 20 mg by mouth daily.   Yes [provider]  venlafaxine (EFFEXOR) 37.5 MG tablet Take 37.5 mg by mouth daily.   Yes [provider]  sodium chloride flush (NS) 0.9 %  SOLN 5 mLs by Intracatheter route every 8 (eight) hours. 05/30/22 08/28/22  Benjamine Sprague, DO    Scheduled Meds:  sodium chloride   Intravenous Once   amiodarone  200 mg Oral Daily   aspirin  81 mg Oral Daily   atorvastatin  40 mg Oral QHS   Chlorhexidine Gluconate Cloth  6 each Topical Daily   enoxaparin (LOVENOX) injection  40 mg Subcutaneous Q24H   feeding supplement  237 mL Oral TID BM   furosemide  40 mg Intravenous Q12H   gabapentin  300 mg Oral BID   insulin aspart  0-20 Units Subcutaneous Q4H   insulin aspart  5 Units Subcutaneous TID WC   levalbuterol  0.63 mg Nebulization Q6H   And   ipratropium  0.5 mg Nebulization Q6H   methylPREDNISolone (SOLU-MEDROL) injection  40 mg Intravenous Q12H   metoprolol tartrate  25 mg Oral BID   multivitamin with minerals  1 tablet Oral Daily   mouth rinse  15 mL Mouth Rinse 4 times per day   pantoprazole (PROTONIX) IV  40 mg Intravenous QHS   sertraline  100 mg Oral Daily   sodium chloride flush  10-40 mL Intracatheter Q12H   venlafaxine  37.5 mg Oral Q breakfast   Continuous Infusions:   PRN Meds:.ALPRAZolam, HYDROmorphone (DILAUDID) injection, ipratropium-albuterol, ondansetron **OR** ondansetron (ZOFRAN) IV, mouth rinse, oxyCODONE-acetaminophen, sodium chloride flush   Resolved Hospital Problems   Acute kidney injury    Assessment & Plan:   Sepsis secondary to suspected intraabdominal Infection & UTI, concern for developing HCAP Complicated Diverticulitis s/p robotic sigmoidectomy converted to open Hartman's procedure on 07/19/22 with possible intraabdominal infection  - Trend WBC and monitor fever curve  - Continue meropenem  - Urine culture positive for yeast 08/26: pt received 1x dose of diflucan on 08/27  - PRN antiemetic and pain management - Management  per general surgery  NSTEMI  Acute Decompensated HFrEF (LVEF 45-50% on 07/20/22) New onset atrial fibrillation with rvr  Shock: Septic +/- Cardiogenic ~ RESOLVED  Echo  08/23: EF 45 to 50%, left ventricle demonstrates global hypokinesis  - Continuous cardiac monitoring - Maintain map >65 - Heparin gtt discontinued due to worsening anemia  - Diuresis as BP and renal function permits ~ currently on 40 mg IV Lasix q12h - Continue po amiodarone & metoprolol for rate control  - Continue aspirin and atorvastatin  - Cardiology following appreciate input: considering further ischemic work-up pending clinical course, but favoring conservative management   Acute Hypoxic Respiratory Failure in the setting of pulmonary edema & suspected developing HCAP - Supplemental O2 as needed to maintain O2 sats >92% - BiPAP as needed - High risk for intubation - Follow intermittent Chest X-ray & ABG as needed - Scheduled and prn bronchodilator therapy   - ABX as above (broadened to Meropenem 8/27) - Continue iv steroids 40 mg q12hrs - Aggressive Pulmonary toilet as able  Normocytic Normochromic Anemia without s/sx of overt blood loss - Monitor for S/Sx of bleeding - Trend CBC - Heparin gtt discontinued 08/28  Mild Hypokalemia Hypophosphatemia  Lactic Acidosis (3.4 ~2.3 ~) - Trend BMP  - Replace electrolytes as indicated  - Monitor UOP - Avoid nephrotoxic medications     Type II Diabetes mellitus - CBG's q4hrs - SSI - Hemoglobin A1C pending   Hypothyroidism - Continue Synthroid   Pt is critically ill, high risk for further decompensation, need for intubation, cardiac arrest and death.  Discussed GOC with pt, she confirms Full Code status and that she would want to be intubated if her respiratory status should worsen.  Will consult Palliative Care to assist with further [place] discussions.    Best practice:  Diet: Full Liquid Pain/Anxiety/Delirium protocol (if indicated): Yes  VAP protocol (if indicated): Not indicated DVT prophylaxis: SCD's GI prophylaxis: PPI Glucose control:  SSI Yes Central venous access: PICC line Arterial line:  N/A Foley:  Yes, and it  is still needed Mobility:  Mobilize as able   PT consulted: N/A Last date of multidisciplinary goals of care discussion [07/26/22] Code Status:  full code Disposition: ICU    Care time: 35 minutes       Rufina Falco, DNP, CCRN, FNP-C, AGACNP-BC Acute Care & Family Nurse Practitioner  [place] for personal pager PCCM on call pager [phone] until 7 am

## 2022-07-27 NOTE — Consult Note (Addendum)
[place] Nurse ostomy follow up: POD 8 Stoma type/location: LLQ colostomy Stomal assessment/size: oval, raised, located in a crease with additional creasing distally. 1 and 1/2 inch x 2 inches Peristomal assessment: intact Treatment options for stomal/peristomal skin: skin barrier ring Output: 166ms soft brown stool emptied from pouch Ostomy pouching: 2pc. 2 and 3/4 inch ostomy pouching system with skin barrier ring. Pouch is [employer] skin barrier is LKellie Simmering# 2 and skin barrier ring is LKellie Simmering# [phone] provided:  Patient is still in ICU and has not been out of bed. Respiratory status is improving. She will try to dangle later today and staff will get her to chair is she can tolerate. She reports weakness.  Patient asks me if I know she has cancer and I tell her that I had heard that the test results showed that. She then tells me that we all have things to handle. She reports that her daughter, who [place] has an ostomy for ulcerative colitis. I tell her that that was was likely an ileostomy and she says, "That's right."  I tell her that hers is a colostomy and she then drifts off to sleep. She awakens after I replace her pouch and straighten her bed linens to say, "Thank you." I tell her I will see her on Monday.  [place] Yes   A pressure redistribution chair cushion is requested for her use when OOB in chair. A sacral silicone foam is to be placed for PI prevention. Her heels are floated.  Patient has not been able to learn ostomy care as of this time. Teaching will continue after patient is transferred to floor. It is noted that she may go to a Rehab/SNF facility post discharge and at this time I agree with that [place]   Supply at bedside for next pouch change.  [facility] will follow, and will remain available to this patient, the nursing and medical teams.    Thank you for inviting [place] participate in this  patient's Plan of Care.  LMaudie Flakes MSN, RN, CNS, [place] CSerita Grammes [employer]phone:  [phone]

## 2022-07-28 DIAGNOSIS — J9601 Acute respiratory failure with hypoxia: Secondary | ICD-10-CM | POA: Diagnosis not present

## 2022-07-28 DIAGNOSIS — E663 Overweight: Secondary | ICD-10-CM | POA: Diagnosis present

## 2022-07-28 DIAGNOSIS — I214 Non-ST elevation (NSTEMI) myocardial infarction: Secondary | ICD-10-CM

## 2022-07-28 DIAGNOSIS — I5021 Acute systolic (congestive) heart failure: Secondary | ICD-10-CM | POA: Diagnosis not present

## 2022-07-28 DIAGNOSIS — K5792 Diverticulitis of intestine, part unspecified, without perforation or abscess without bleeding: Secondary | ICD-10-CM | POA: Diagnosis not present

## 2022-07-28 LAB — COMPREHENSIVE METABOLIC PANEL
ALT: 36 U/L (ref 0–44)
AST: 62 U/L — ABNORMAL HIGH (ref 15–41)
Albumin: 2.3 g/dL — ABNORMAL LOW (ref 3.5–5.0)
Alkaline Phosphatase: 72 U/L (ref 38–126)
Anion gap: 5 (ref 5–15)
BUN: 40 mg/dL — ABNORMAL HIGH (ref 8–23)
CO2: 30 mmol/L (ref 22–32)
Calcium: 8 mg/dL — ABNORMAL LOW (ref 8.9–10.3)
Chloride: 106 mmol/L (ref 98–111)
Creatinine, Ser: 0.6 mg/dL (ref 0.44–1.00)
GFR, Estimated: >60 mL/min (ref >60)
Glucose, Bld: 153 mg/dL — ABNORMAL HIGH (ref 70–99)
Potassium: 3.5 mmol/L (ref 3.5–5.1)
Sodium: 141 mmol/L (ref 135–145)
Total Bilirubin: 0.5 mg/dL (ref 0.3–1.2)
Total Protein: 5.3 g/dL — ABNORMAL LOW (ref 6.5–8.1)

## 2022-07-28 LAB — GLUCOSE, CAPILLARY
Glucose-Capillary: 147 mg/dL — ABNORMAL HIGH (ref 70–99)
Glucose-Capillary: 146 mg/dL — ABNORMAL HIGH (ref 70–99)
Glucose-Capillary: 232 mg/dL — ABNORMAL HIGH (ref 70–99)
Glucose-Capillary: 233 mg/dL — ABNORMAL HIGH (ref 70–99)
Glucose-Capillary: 249 mg/dL — ABNORMAL HIGH (ref 70–99)
Glucose-Capillary: 203 mg/dL — ABNORMAL HIGH (ref 70–99)

## 2022-07-28 LAB — MAGNESIUM: Magnesium: 1.9 mg/dL (ref 1.7–2.4)

## 2022-07-28 LAB — CBC
HCT: 30.1 % — ABNORMAL LOW (ref 36.0–46.0)
Hemoglobin: 9 g/dL — ABNORMAL LOW (ref 12.0–15.0)
MCH: 27.8 pg (ref 26.0–34.0)
MCHC: 29.9 g/dL — ABNORMAL LOW (ref 30.0–36.0)
MCV: 92.9 fL (ref 80.0–100.0)
Platelets: 281 10*3/uL (ref 150–400)
RBC: 3.24 MIL/uL — ABNORMAL LOW (ref 3.87–5.11)
RDW: 18.5 % — ABNORMAL HIGH (ref 11.5–15.5)
WBC: 8.1 10*3/uL (ref 4.0–10.5)
nRBC: 0 % (ref 0.0–0.2)

## 2022-07-28 LAB — PHOSPHORUS: Phosphorus: 2.5 mg/dL (ref 2.5–4.6)

## 2022-07-28 LAB — TRIGLYCERIDES: Triglycerides: 82 mg/dL (ref <150)

## 2022-07-28 MED ORDER — SODIUM CHLORIDE 0.9% FLUSH: 3.0000 mL | INTRAVENOUS | Status: DC | PRN

## 2022-07-28 MED ORDER — LEVALBUTEROL HCL 0.63 MG/3ML IN NEBU
0.6300 mg | INHALATION_SOLUTION | Freq: Two times a day (BID) | RESPIRATORY_TRACT | Status: DC
Start: 2022-07-28 — End: 2022-08-01
  Administered 2022-07-28 – 2022-08-01 (×7): 0.63 mg via RESPIRATORY_TRACT
  Filled 2022-07-28 (×8): qty 3

## 2022-07-28 MED ORDER — ASPIRIN 81 MG PO CHEW
81.0000 mg | CHEWABLE_TABLET | ORAL | Status: AC
  Administered 2022-07-29: 81 mg via ORAL
  Filled 2022-07-28: qty 1

## 2022-07-28 MED ORDER — VITAMIN C 500 MG PO TABS
500.0000 mg | ORAL_TABLET | Freq: Two times a day (BID) | ORAL | Status: DC
  Administered 2022-07-28 – 2022-08-01 (×8): 500 mg via ORAL
  Filled 2022-07-28 (×8): qty 1

## 2022-07-28 MED ORDER — SODIUM CHLORIDE 0.9 % IV SOLN: INTRAVENOUS | Status: DC

## 2022-07-28 MED ORDER — LEVALBUTEROL HCL 0.63 MG/3ML IN NEBU
0.6300 mg | INHALATION_SOLUTION | Freq: Two times a day (BID) | RESPIRATORY_TRACT | Status: DC
Start: 2022-07-28 — End: 2022-07-28

## 2022-07-28 MED ORDER — POTASSIUM CHLORIDE CRYS ER 20 MEQ PO TBCR
40.0000 meq | EXTENDED_RELEASE_TABLET | Freq: Once | ORAL | Status: AC
  Administered 2022-07-28: 40 meq via ORAL
  Filled 2022-07-28: qty 2

## 2022-07-28 MED ORDER — SODIUM CHLORIDE 0.9 % IV SOLN: 250.0000 mL | INTRAVENOUS | Status: DC | PRN

## 2022-07-28 MED ORDER — SPIRONOLACTONE 25 MG PO TABS
12.5000 mg | ORAL_TABLET | Freq: Every day | ORAL | Status: DC
  Administered 2022-07-28 – 2022-08-01 (×5): 12.5 mg via ORAL
  Filled 2022-07-28 (×2): qty 0.5
  Filled 2022-07-28: qty 1
  Filled 2022-07-28 (×3): qty 0.5
  Filled 2022-07-28: qty 1

## 2022-07-28 MED ORDER — ZINC SULFATE 220 (50 ZN) MG PO CAPS
220.0000 mg | ORAL_CAPSULE | Freq: Every day | ORAL | Status: DC
  Administered 2022-07-28 – 2022-08-01 (×4): 220 mg via ORAL
  Filled 2022-07-28 (×4): qty 1

## 2022-07-28 MED ORDER — IPRATROPIUM BROMIDE 0.02 % IN SOLN
0.5000 mg | Freq: Four times a day (QID) | RESPIRATORY_TRACT | Status: DC
Start: 2022-07-28 — End: 2022-07-28

## 2022-07-28 MED ORDER — IPRATROPIUM BROMIDE 0.02 % IN SOLN
0.5000 mg | Freq: Two times a day (BID) | RESPIRATORY_TRACT | Status: DC
Start: 2022-07-28 — End: 2022-08-01
  Administered 2022-07-28 – 2022-08-01 (×7): 0.5 mg via RESPIRATORY_TRACT
  Filled 2022-07-28 (×8): qty 2.5

## 2022-07-28 MED ORDER — SODIUM CHLORIDE 0.9% FLUSH
3.0000 mL | Freq: Two times a day (BID) | INTRAVENOUS | Status: DC
  Administered 2022-07-28 – 2022-08-01 (×6): 3 mL via INTRAVENOUS

## 2022-07-28 MED ORDER — IPRATROPIUM BROMIDE 0.02 % IN SOLN: 0.5000 mg | Freq: Two times a day (BID) | RESPIRATORY_TRACT | Status: DC

## 2022-07-28 NOTE — Progress Notes (Addendum)
Nutrition Follow-up  DOCUMENTATION CODES:   Severe malnutrition in context of acute illness/injury  INTERVENTION:   -Downgrade diet to dysphagia 3 (mechanical soft) for ease of intake -MVI with minerals daily -Continue Ensure Enlive po TID, each supplement provides 350 kcal and 20 grams of protein -Magic cup TID with meals, each supplement provides 290 kcal and 9 grams of protein  -500 mg vitamin C BID -220 mg zinc sulfate daily x 14 days  NUTRITION DIAGNOSIS:   Severe Malnutrition related to acute illness as evidenced by percent weight loss, severe fat depletion, severe muscle depletion.  Ongoing  GOAL:   Patient will meet greater than or equal to 90% of their needs  Progressing   MONITOR:   PO intake, Supplement acceptance, Labs, Weight trends, Skin, I & O's  REASON FOR ASSESSMENT:   Consult New TPN/TNA  ASSESSMENT:   76 y/o female with a PMH of hypertension, Graves' disease s/p XRT, hypothyroidism, type 2 DM, IBS, HTN, HLD and diverticulitis with abscess formation s/p IR drain 7/18, now s/p Hartmann's procedure 4/09 complicated by sepsis, AKI, NSTEMI and new Afib. Biospy returned with new colon cancer.  8/26- advanced to full liquids 8/29- advanced to soft diet 8/31- JP drain removed  Reviewed I/O's: -1.6 L x 24 hours and +2.4 L since admission  UOP: 2 L x 24 hours  Drain output: 7.5 ml x 24 hours  Ileostomy output: 425 ml x 24 hours   Case discussed with RN, MD and during CU rounds. Pt continues to improve. Plan for lt heart cath tomorrow.   Spoke with pt at bedside, who was able to identify her pastor and wife who were visiting her. She gave RD permission to discuss medical concerns in front of them. Both pt and pastor expressed gratitude for medical progression; pt recalls being on bi-pap last week and reports feeling a lot better since then.   Per pt, she has a fair appetite. She has been drinking a lot of milk, ice cream, and yogurt and really enjoys  Ensure supplements (has drank 2 today). Per pt, she does not eat a lot of meat or hard textured foods as they are difficult to chew. She is amenable to a mechanical soft diet. Documented meal completions 10-20%.   Discussed importance of good meal and supplement intake to promote healing.   Per TOC notes, plan for SNF placement at discharge.   Medications reviewed and include lasix, solu-medrol, and aldactone.   Labs reviewed: CBGS: 88-232 (inpatient orders for glycemic control are 0-20 units insulin aspart every 4 hours and 5 units insulin aspart TID with meals).    Diet Order:   Diet Order             DIET SOFT Room service appropriate? Yes; Fluid consistency: Thin  Diet effective now                   EDUCATION NEEDS:   Education needs have been addressed  Skin:  Skin Assessment: Skin Integrity Issues: Skin Integrity Issues:: Stage II, Incisions Stage II: sacrum Incisions: closed abdomen  Last BM:  07/28/22 (325 ml via ileostomy)  Height:   Ht Readings from Last 1 Encounters:  07/20/22 5' 2.99" (1.6 m)    Weight:   Wt Readings from Last 1 Encounters:  07/28/22 66.9 kg    Ideal Body Weight:  52.3 kg  BMI:  Body mass index is 26.13 kg/m.  Estimated Nutritional Needs:   Kcal:  1650-1850  Protein:  80-95 grams  Fluid:  > 1.6 L    Loistine Chance, RD, LDN, [place] Registered Dietitian II Certified Diabetes Care and Education Specialist Please refer to [facility] for RD and/or RD on-call/weekend/after hours pager

## 2022-07-28 NOTE — Assessment & Plan Note (Addendum)
Status post partial sigmoidectomy along with Hartman's pouch.  General surgery following.  Has removed JP drain and now with daily wound care, wet-to-dry.  Surgery followed up cleaned out small pocket of fluid.  Continue daily wet-to-dry dressing changes which will be continued to be done at skilled nursing.  Follow-up with general surgery as outpatient.  Ostomy care plan noted as well.

## 2022-07-28 NOTE — Assessment & Plan Note (Addendum)
Nutrition Status: Nutrition Problem: Severe Malnutrition Etiology: acute illness Signs/Symptoms: percent weight loss, severe fat depletion, severe muscle depletion Percent weight loss: 21 %    Seen by nutrition.  On Ensure shakes 3 times daily.

## 2022-07-28 NOTE — Progress Notes (Addendum)
Patient was seen evaluated and examined by me and the PA on 07/28/22.  Course of action, evaluation, and management decisions were developed solely by me, but detailed below in the PA's note.  [place] NOTE       Patient ID: Sarah [name] MRN: [id number] DOB/AGE: 1946/04/04 76 y.o.  Admit date: 07/19/2022 Referring Physician Donell Beers, NP  Primary Physician Dr. Sabra Heck Primary Cardiologist Dr. Saralyn Pilar  Reason for Consultation elevated troponin & abnormal EKG, concern for perioperative NSTEMI  HPI: Sarah [name] is a 89yoF with a PMH of hypertension, Graves' disease s/p XRT, type 2 diabetes, IBS, diverticulitis with abscess formation s/p CT-guided drain placement in early June 2023 who presented to [facility] for scheduled laparoscopic sigmoidectomy on 07/19/2022 that was ultimately converted to an open Hartman's procedure, now s/p colostomy. The evening following the procedure she reportedly became hypotensive and tachycardic with SBP in the 90s.  CCM was consulted and the patient was transferred to the ICU early morning of 8/23. Cardiology is consulted the afternoon of 8/23 due to concern for an abnormal EKG and elevated troponin which peaked at 13,300 on 8/24.  Echo shows a newly reduced ejection fraction to 45-50%, consistent with perioperative NSTEMI.  She also developed atrial fibrillation with RVR on 8/24.  Interval History: -continues to diurese well, weaned from HFNC to 4L [place]  -Converted back to sinus rhythm with rates in the 70s to 80s with frequent PACs and PVCs -Continues to feel fatigued, no chest pain, no subjective shortness of breath, eager to get up into a chair today   Review of systems complete and found to be negative unless listed above    Past Medical History:  Diagnosis Date   Arthritis    hands   Cancer ([place])    thyroid- radiation   Diabetes mellitus without complication ([place])    Hyperlipidemia    Hypertension    Hypothyroidism    IBS  (irritable bowel syndrome)     Past Surgical History:  Procedure Laterality Date   ABDOMINAL HYSTERECTOMY     CATARACT EXTRACTION W/PHACO Left 12/21/2016   Procedure: CATARACT EXTRACTION PHACO AND INTRAOCULAR LENS PLACEMENT ([place])  left eye;  Surgeon: Leandrew Koyanagi, MD;  Location: [facility];  Service: Ophthalmology;  Laterality: Left;  Diabetic - oral meds   CATARACT EXTRACTION W/PHACO Right 01/25/2017   Procedure: CATARACT EXTRACTION PHACO AND INTRAOCULAR LENS PLACEMENT ([place])  right diabetic;  Surgeon: Leandrew Koyanagi, MD;  Location: [facility];  Service: Ophthalmology;  Laterality: Right;  diabetic - oral meds   CHOLECYSTECTOMY     COLONOSCOPY WITH PROPOFOL N/A 10/26/2016   Procedure: COLONOSCOPY WITH PROPOFOL;  Surgeon: Manya Silvas, MD;  Location: [facility] ENDOSCOPY;  Service: Endoscopy;  Laterality: N/A;   IR RADIOLOGIST EVAL & MGMT  06/14/2022   KNEE SURGERY Right     Medications Prior to Admission  Medication Sig Dispense Refill Last Dose   cyanocobalamin 1000 MCG tablet Take 1,000 mcg by mouth daily.   07/18/2022   glimepiride (AMARYL) 4 MG tablet Take 4 mg by mouth daily with breakfast.   07/18/2022   levothyroxine (SYNTHROID) 150 MCG tablet Take 150 mcg by mouth daily before breakfast.   07/19/2022 at 0730   metFORMIN (GLUCOPHAGE) 500 MG tablet Take 500 mg by mouth 2 (two) times daily with a meal.      metoprolol succinate (TOPROL-XL) 50 MG 24 hr tablet Take 50 mg by mouth daily. Take with or immediately following a meal.  07/19/2022 at 0730   Olmesartan-Amlodipine-HCTZ 40-10-25 MG TABS Take 1 tablet by mouth 3 (three) times a week. Mon, Wed, Fri   07/19/2022 at 0730   ondansetron (ZOFRAN-ODT) 4 MG disintegrating tablet Take 1 tablet (4 mg total) by mouth every 8 (eight) hours as needed for nausea or vomiting. 12 tablet 0 07/19/2022 at 0200   potassium chloride (K-DUR) 10 MEQ tablet Take 10 mEq by mouth daily.   07/18/2022   sertraline (ZOLOFT) 100 MG tablet  Take 100 mg by mouth daily.      simvastatin (ZOCOR) 20 MG tablet Take 20 mg by mouth daily.   Past Week   venlafaxine (EFFEXOR) 37.5 MG tablet Take 37.5 mg by mouth daily.   07/19/2022 at 0730   sodium chloride flush (NS) 0.9 % SOLN 5 mLs by Intracatheter route every 8 (eight) hours. 1350 mL 0    Social History   Socioeconomic History   Marital status: Married    Spouse name: Not on file   Number of children: Not on file   Years of education: Not on file   Highest education level: Not on file  Occupational History   Not on file  Tobacco Use   Smoking status: Never   Smokeless tobacco: Never   Tobacco comments:    social as teenager  Vaping Use   Vaping Use: Never used  Substance and Sexual Activity   Alcohol use: No   Drug use: No   Sexual activity: Not on file  Other Topics Concern   Not on file  Social History Narrative   Not on file   Social Determinants of Health   Financial Resource Strain: Not on file  Food Insecurity: Not on file  Transportation Needs: Not on file  Physical Activity: Not on file  Stress: Not on file  Social Connections: Not on file  Intimate Partner Violence: Not on file    Family History  Problem Relation Age of Onset   Breast cancer Neg Hx     PHYSICAL EXAM General: Elderly and frail-appearing Caucasian female, in no acute distress, sitting in incline in ICU bed. HEENT:  Normocephalic and atraumatic. Neck:  No JVD.  Lungs: Normal respiratory effort on 4 L by nasal cannula, coarse bibasilar breath sounds, somewhat improved aeration from yesterday  heart: Regular rate and rhythm, normal S1 and S2 without gallops or murmurs.  Abdomen: Non-distended appearing.   Msk: Normal strength and tone for age. Extremities: No clubbing, cyanosis.  Improved upper extremity edema, no peripheral edema.   Neuro: Alert and oriented X 3. Psych:  Answers questions appropriately.   Labs: Basic Metabolic Panel: Recent Labs    07/27/22 0501 07/28/22 0514   NA 140 141  K 4.1 3.5  CL 106 106  CO2 29 30  GLUCOSE 239* 153*  BUN 39* 40*  CREATININE 0.68 0.60  CALCIUM 7.8* 8.0*  MG 1.8 1.9  PHOS 2.9 2.5    Liver Function Tests: Recent Labs    07/28/22 0514  AST 62*  ALT 36  ALKPHOS 72  BILITOT 0.5  PROT 5.3*  ALBUMIN 2.3*    No results for input(s): "LIPASE", "AMYLASE" in the last 72 hours. CBC: Recent Labs    07/27/22 0854 07/28/22 0514  WBC 12.5* 8.1  HGB 8.9* 9.0*  HCT 29.3* 30.1*  MCV 91.8 92.9  PLT 306 281    Cardiac Enzymes: No results for input(s): "CKTOTAL", "CKMB", "CKMBINDEX", "TROPONINIHS" in the last 72 hours.  BNP: Invalid input(s): "POCBNP" D-Dimer:  No results for input(s): "DDIMER" in the last 72 hours. Hemoglobin A1C: No results for input(s): "HGBA1C" in the last 72 hours. Fasting Lipid Panel: Recent Labs    07/28/22 0514  TRIG 82    Thyroid Function Tests: No results for input(s): "TSH", "T4TOTAL", "T3FREE", "THYROIDAB" in the last 72 hours.  Invalid input(s): "FREET3" Anemia Panel: No results for input(s): "VITAMINB12", "FOLATE", "FERRITIN", "TIBC", "IRON", "RETICCTPCT" in the last 72 hours.   ECHOCARDIOGRAM LIMITED  Result Date: 07/27/2022    ECHOCARDIOGRAM LIMITED REPORT   Patient Name:   Sarah [name] Date of Exam: 07/26/2022 Medical Rec #:  [id number]   Height:       63.0 in Accession #:    [id number]  Weight:       155.0 lb Date of Birth:  07-04-1946   BSA:          1.735 m Patient Age:    4 years    BP:           123/77 mmHg Patient Gender: F           HR:           98 bpm. Exam Location:  [facility] Procedure: Limited Echo, Cardiac Doppler and Color Doppler Indications:     NSTEMI I21.4  History:         Patient has prior history of Echocardiogram examinations, most                  recent 06/19/2022. Risk Factors:Hypertension and Diabetes.                  Cancer.  Sonographer:     Sherrie Sport Referring Phys:  [id number] [place] TANG Diagnosing Phys: Yolonda Kida MD IMPRESSIONS  1. Left  ventricular ejection fraction, by estimation, is 25 to 30%. The left ventricle has severely decreased function. The left ventricle demonstrates global hypokinesis. The left ventricular internal cavity size was mildly dilated. Left ventricular diastolic function could not be evaluated.  2. The right ventricular size is mildly enlarged.  3. Left atrial size was mildly dilated.  4. Right atrial size was mildly dilated.  5. The mitral valve is normal in structure. FINDINGS  Left Ventricle: Left ventricular ejection fraction, by estimation, is 25 to 30%. The left ventricle has severely decreased function. The left ventricle demonstrates global hypokinesis. The left ventricular internal cavity size was mildly dilated. There is no left ventricular hypertrophy. Left ventricular diastolic function could not be evaluated. Right Ventricle: The right ventricular size is mildly enlarged. No increase in right ventricular wall thickness. Left Atrium: Left atrial size was mildly dilated. Right Atrium: Right atrial size was mildly dilated. Pericardium: There is no evidence of pericardial effusion. Mitral Valve: The mitral valve is normal in structure. Tricuspid Valve: The tricuspid valve is normal in structure. Tricuspid valve regurgitation is mild. Pulmonic Valve: The pulmonic valve was normal in structure. Pulmonic valve regurgitation is trivial. Aorta: The ascending aorta was not well visualized. LEFT VENTRICLE PLAX 2D LVIDd:         5.20 cm LVIDs:         4.80 cm LV PW:         1.00 cm LV IVS:        1.10 cm  LV Volumes (MOD) LV vol d, MOD A2C: 108.0 ml LV vol d, MOD A4C: 126.0 ml LV vol s, MOD A2C: 83.3 ml LV vol s, MOD A4C: 97.7 ml LV SV MOD A2C:  24.7 ml LV SV MOD A4C:     126.0 ml LV SV MOD BP:      25.7 ml LEFT ATRIUM         Index LA diam:    4.70 cm 2.71 cm/m   AORTA Ao Root diam: 2.90 cm TRICUSPID VALVE TR Peak grad:   30.9 mmHg TR Vmax:        278.00 cm/s Yolonda Kida MD Electronically signed by Yolonda Kida MD Signature Date/Time: 07/27/2022/7:41:23 AM    Final    DG Chest Port 1 View  Result Date: 07/26/2022 CLINICAL DATA:  Acute respiratory failure and hypoxia. EXAM: PORTABLE CHEST 1 VIEW COMPARISON:  07/25/2022 FINDINGS: There is a right arm PICC line with tip in the right atrium. Stable cardiomediastinal contours. Bilateral multifocal airspace opacities are unchanged in the interval. No new findings. IMPRESSION: No change in aeration to the lungs compared with previous exam. Electronically Signed   By: Kerby Moors M.D.   On: 07/26/2022 11:05     Radiology: ECHOCARDIOGRAM LIMITED  Result Date: 07/27/2022    ECHOCARDIOGRAM LIMITED REPORT   Patient Name:   Sarah [name] Date of Exam: 07/26/2022 Medical Rec #:  [id number]   Height:       63.0 in Accession #:    [id number]  Weight:       155.0 lb Date of Birth:  Sep 22, 1946   BSA:          1.735 m Patient Age:    36 years    BP:           123/77 mmHg Patient Gender: F           HR:           98 bpm. Exam Location:  [facility] Procedure: Limited Echo, Cardiac Doppler and Color Doppler Indications:     NSTEMI I21.4  History:         Patient has prior history of Echocardiogram examinations, most                  recent 06/19/2022. Risk Factors:Hypertension and Diabetes.                  Cancer.  Sonographer:     Sherrie Sport Referring Phys:  [id number] [place] TANG Diagnosing Phys: Yolonda Kida MD IMPRESSIONS  1. Left ventricular ejection fraction, by estimation, is 25 to 30%. The left ventricle has severely decreased function. The left ventricle demonstrates global hypokinesis. The left ventricular internal cavity size was mildly dilated. Left ventricular diastolic function could not be evaluated.  2. The right ventricular size is mildly enlarged.  3. Left atrial size was mildly dilated.  4. Right atrial size was mildly dilated.  5. The mitral valve is normal in structure. FINDINGS  Left Ventricle: Left ventricular ejection fraction, by estimation, is 25 to  30%. The left ventricle has severely decreased function. The left ventricle demonstrates global hypokinesis. The left ventricular internal cavity size was mildly dilated. There is no left ventricular hypertrophy. Left ventricular diastolic function could not be evaluated. Right Ventricle: The right ventricular size is mildly enlarged. No increase in right ventricular wall thickness. Left Atrium: Left atrial size was mildly dilated. Right Atrium: Right atrial size was mildly dilated. Pericardium: There is no evidence of pericardial effusion. Mitral Valve: The mitral valve is normal in structure. Tricuspid Valve: The tricuspid valve is normal in structure. Tricuspid valve regurgitation is mild. Pulmonic Valve: The pulmonic valve was  normal in structure. Pulmonic valve regurgitation is trivial. Aorta: The ascending aorta was not well visualized. LEFT VENTRICLE PLAX 2D LVIDd:         5.20 cm LVIDs:         4.80 cm LV PW:         1.00 cm LV IVS:        1.10 cm  LV Volumes (MOD) LV vol d, MOD A2C: 108.0 ml LV vol d, MOD A4C: 126.0 ml LV vol s, MOD A2C: 83.3 ml LV vol s, MOD A4C: 97.7 ml LV SV MOD A2C:     24.7 ml LV SV MOD A4C:     126.0 ml LV SV MOD BP:      25.7 ml LEFT ATRIUM         Index LA diam:    4.70 cm 2.71 cm/m   AORTA Ao Root diam: 2.90 cm TRICUSPID VALVE TR Peak grad:   30.9 mmHg TR Vmax:        278.00 cm/s Yolonda Kida MD Electronically signed by Yolonda Kida MD Signature Date/Time: 07/27/2022/7:41:23 AM    Final    DG Chest Port 1 View  Result Date: 07/26/2022 CLINICAL DATA:  Acute respiratory failure and hypoxia. EXAM: PORTABLE CHEST 1 VIEW COMPARISON:  07/25/2022 FINDINGS: There is a right arm PICC line with tip in the right atrium. Stable cardiomediastinal contours. Bilateral multifocal airspace opacities are unchanged in the interval. No new findings. IMPRESSION: No change in aeration to the lungs compared with previous exam. Electronically Signed   By: Kerby Moors M.D.   On: 07/26/2022  11:05   DG Chest Port 1 View  Result Date: 07/25/2022 CLINICAL DATA:  Acute respiratory failure EXAM: PORTABLE CHEST 1 VIEW COMPARISON:  07/24/2022 FINDINGS: Cardiac shadow is enlarged but stable. Right PICC is seen deep in the right atrium but stable. Patchy airspace opacities are again identified bilaterally but mildly improved when compared with the prior study. No bony abnormality is noted. IMPRESSION: Slight improved aeration when compared with the previous exam. Electronically Signed   By: Inez Catalina M.D.   On: 07/25/2022 04:10   DG Chest Port 1 View  Result Date: 07/24/2022 CLINICAL DATA:  Acute respiratory failure EXAM: PORTABLE CHEST 1 VIEW COMPARISON:  July 23, 2022 FINDINGS: A right PICC line terminates near the caval atrial junction. The cardiomediastinal silhouette is stable. Bilateral pulmonary opacities, particularly in the perihilar regions, have worsened. Probable skin fold over the lateral right lung with lung markings on both sides. No convincing evidence of pneumothorax. Decreased lung volumes. No other acute abnormalities. IMPRESSION: 1. The linear stripe over the lateral right mid chest is likely a skin fold as there are lung markings on both sides. No convincing evidence of pneumothorax. Recommend attention on follow-up. 2. Stable right PICC line. 3. Increasing bilateral perihilar opacities. While pulmonary edema is possible, the findings could represent a multifocal infectious process/pneumonia. Recommend clinical correlation and attention on follow-up. Electronically Signed   By: Dorise Bullion III M.D.   On: 07/24/2022 07:52   DG Chest Port 1 View  Result Date: 07/23/2022 CLINICAL DATA:  Hypoxic respiratory failure. EXAM: PORTABLE CHEST 1 VIEW COMPARISON:  Portable chest yesterday at 8:27 a.m. FINDINGS: 4:46 a.m. Right PICC tip remains in the upper right atrium. Moderate cardiomegaly is stable. Today there is increased perihilar vascular congestion and central interstitial  edema. There are stable small pleural effusions and overlying consolidation or atelectasis, denser on the left. There is increased  perihilar opacity on the right which could be atelectasis, infiltrate or ground-glass edema. The more cephalad lungs remain clear. In all other respects no further changes. There is no pneumothorax.  Aortic atherosclerosis. IMPRESSION: 1. Increased perihilar vascular congestion and mild central interstitial edema. 2. Similar layering pleural effusions and overlying lung opacities. 3. Increased right mid perihilar opacity. Electronically Signed   By: Telford Nab M.D.   On: 07/23/2022 06:21   DG Chest Port 1 View  Result Date: 07/22/2022 CLINICAL DATA:  Status post sigmoid colectomy. EXAM: PORTABLE CHEST 1 VIEW COMPARISON:  July 21, 2022. FINDINGS: Stable cardiomegaly. Interval placement of right-sided PICC line with distal tip in expected position of cavoatrial junction. Mild bibasilar subsegmental atelectasis is noted. Bony thorax is unremarkable. IMPRESSION: Interval placement of right-sided PICC line. Mild bibasilar subsegmental atelectasis is noted with probable minimal pleural effusions. Electronically Signed   By: Marijo Conception M.D.   On: 07/22/2022 08:47   [place] EKG SITE RITE  Result Date: 07/21/2022 If Site Rite image not attached, placement could not be confirmed due to current cardiac rhythm.  DG Abd 1 View  Result Date: 07/21/2022 CLINICAL DATA:  [id number].  Abdominal pain EXAM: ABDOMEN - 1 VIEW COMPARISON:  06/21/2015 FINDINGS: Multiple surgical skin staples overlie the lower abdomen. Normal abdominal gas pattern. No gross free intraperitoneal gas. Cholecystectomy clips are seen in the right upper quadrant. Surgical drain overlies the mid pelvis. No organomegaly. Osseous structures are age-appropriate. IMPRESSION: Normal abdominal gas pattern. Electronically Signed   By: Fidela Salisbury M.D.   On: 07/21/2022 00:36   DG Chest 1 View  Result Date:  07/21/2022 CLINICAL DATA:  Abdominal pain.  [id number] EXAM: CHEST  1 VIEW COMPARISON:  02/23/2021 FINDINGS: Lungs volumes are small, but are symmetric and are clear. No pneumothorax or pleural effusion. Cardiac size within normal limits. Pulmonary vascularity is normal. Osseous structures are age-appropriate. No acute bone abnormality. IMPRESSION: No active disease. Electronically Signed   By: Fidela Salisbury M.D.   On: 07/21/2022 00:35   ECHOCARDIOGRAM COMPLETE  Result Date: 07/20/2022    ECHOCARDIOGRAM REPORT   Patient Name:   Sarah [name] Date of Exam: 07/20/2022 Medical Rec #:  [id number]   Height:       63.0 in Accession #:    [id number]  Weight:       122.0 lb Date of Birth:  1946-10-04   BSA:          1.567 m Patient Age:    50 years    BP:           105/73 mmHg Patient Gender: F           HR:           126 bpm. Exam Location:  [facility] Procedure: 2D Echo, Color Doppler and Cardiac Doppler Indications:     I21.4 NSTEMI  History:         Patient has no prior history of Echocardiogram examinations.                  Arrythmias:Tachycardia; Risk Factors:Hypertension, Diabetes and                  Dyslipidemia.  Sonographer:     Charmayne Sheer Referring Phys:  [id number] [place] TANG Diagnosing Phys: Yolonda Kida MD  Sonographer Comments: No subcostal window. IMPRESSIONS  1. Left ventricular ejection fraction, by estimation, is 45 to 50%. The left ventricle has mildly decreased function. The left ventricle  demonstrates global hypokinesis. The left ventricular internal cavity size was mildly to moderately dilated. Left ventricular diastolic parameters were normal.  2. Right ventricular systolic function is normal. The right ventricular size is normal.  3. The mitral valve is normal in structure. No evidence of mitral valve regurgitation.  4. The aortic valve is normal in structure. Aortic valve regurgitation is not visualized. FINDINGS  Left Ventricle: Left ventricular ejection fraction, by estimation, is 45 to 50%.  The left ventricle has mildly decreased function. The left ventricle demonstrates global hypokinesis. The left ventricular internal cavity size was mildly to moderately dilated. There is borderline left ventricular hypertrophy. Left ventricular diastolic parameters were normal. Right Ventricle: The right ventricular size is normal. No increase in right ventricular wall thickness. Right ventricular systolic function is normal. Left Atrium: Left atrial size was normal in size. Right Atrium: Right atrial size was normal in size. Pericardium: There is no evidence of pericardial effusion. Mitral Valve: The mitral valve is normal in structure. No evidence of mitral valve regurgitation. Tricuspid Valve: The tricuspid valve is normal in structure. Tricuspid valve regurgitation is trivial. Aortic Valve: The aortic valve is normal in structure. Aortic valve regurgitation is not visualized. Aortic valve mean gradient measures 5.0 mmHg. Aortic valve peak gradient measures 9.0 mmHg. Aortic valve area, by VTI measures 2.19 cm. Pulmonic Valve: The pulmonic valve was normal in structure. Pulmonic valve regurgitation is not visualized. Aorta: The ascending aorta was not well visualized. IAS/Shunts: No atrial level shunt detected by color flow Doppler.  LEFT VENTRICLE PLAX 2D LVIDd:         5.24 cm   Diastology LVIDs:         3.95 cm   LV e' medial:    4.79 cm/s LV PW:         1.53 cm   LV E/e' medial:  17.3 LV IVS:        0.93 cm   LV e' lateral:   7.51 cm/s LVOT diam:     2.00 cm   LV E/e' lateral: 11.1 LV SV:         51 LV SV Index:   32 LVOT Area:     3.14 cm  RIGHT VENTRICLE RV Basal diam:  2.79 cm RV S prime:     26.90 cm/s LEFT ATRIUM           Index        RIGHT ATRIUM           Index LA diam:      3.80 cm 2.42 cm/m   RA Area:     15.10 cm LA Vol (A2C): 33.8 ml 21.57 ml/m  RA Volume:   38.80 ml  24.76 ml/m  AORTIC VALVE AV Area (Vmax):    1.94 cm AV Area (Vmean):   1.77 cm AV Area (VTI):     2.19 cm AV Vmax:            150.00 cm/s AV Vmean:          110.000 cm/s AV VTI:            0.232 m AV Peak Grad:      9.0 mmHg AV Mean Grad:      5.0 mmHg LVOT Vmax:         92.70 cm/s LVOT Vmean:        61.900 cm/s LVOT VTI:          0.162 m LVOT/AV VTI ratio: 0.70  AORTA  Ao Root diam: 3.10 cm MITRAL VALVE MV Area (PHT): 10.25 cm   SHUNTS MV Decel Time: 74 msec     Systemic VTI:  0.16 m MV E velocity: 83.05 cm/s  Systemic Diam: 2.00 cm MV A velocity: 78.65 cm/s MV E/A ratio:  1.06 Nevelyn Mellott D Sherrie Marsan MD Electronically signed by Yolonda Kida MD Signature Date/Time: 07/20/2022/5:44:47 PM    Final     ECHO (stress echo) 02/14/2017  Ref Range & Units 5 yr ago  LV Ejection Fraction (%)  55   Aortic Valve Regurgitation Grade  none   Aortic Valve Stenosis Grade  none   Mitral Valve Regurgitation Grade  trivial   Mitral Valve Stenosis Grade  none   Tricuspid Valve Regurgitation Grade  trivial   Tricuspid Valve Regurgitation Max Velocity (m/s) m/sec 1.4   Right Ventricle Systolic Pressure (mmHg) mmHg 12.4   Narrative                     CARDIOLOGY DEPARTMENT                   [place], [place]                      [id number]                    [facility]                 Acct #: [id number]          [address], [place], [place] [address]       Date: 02/14/2017 02:57 PM                                                             Adult Female Age: 7 yrs                     ECHOCARDIOGRAM REPORT                   Outpatient                                                             [id number]            STUDY:Stress Echo        TAPE:                   MD1:  DRANE, ANNA MARIA             ECHO:Yes   DOPPLER:Yes  FILE:0000-000-000       BP: 148/83 mmHg            COLOR:Yes  CONTRAST:No      MACHINE:[place]      Height: 63 in        RV BIOPSY:No         3D:No   SOUND QLTY:Moderate  Weight: 174 lb           MEDIUM:None                                       BSA: 1.8 m2    ___________________________________________________________________________________________     HISTORY:Chest pain      REASON:Assess, LV function  Indication:R07.89 Other chest pain, I10 Essential (primary) hypertension, R06.02             Shortness of breath  ___________________________________________________________________________________________   STRESS ECHOCARDIOGRAPHY            Protocol:Treadmill ([place])              Drugs:None  Target Heart Rate:128 bpm        Maximum Predicted Heart Rate: 150 bpm   +-------------------+-------------------------+-------------------------+------------+         Stage            Duration (mm:ss)         Heart Rate (bpm)         BP       +-------------------+-------------------------+-------------------------+------------+        RESTING                                           85              148/83     +-------------------+-------------------------+-------------------------+------------+        EXERCISE                4:49                     210                /        +-------------------+-------------------------+-------------------------+------------+        RECOVERY                8:05                      94              176/88     +-------------------+-------------------------+-------------------------+------------+     Stress Duration:4:49 mm:ss    Max Stress H.R.:210 bpm        Target Heart Rate Achieved: Yes   ___________________________________________________________________________________________   WALL SEGMENT CHANGES                         Rest          Stress  Anterior Septum Basal:Normal        Hyperkinetic                    YQM:VHQION        Hyperkinetic                 Apical:Normal        Hyperkinetic     Anterior Wall Basal:Normal        Hyperkinetic                    GEX:BMWUXL        Hyperkinetic                 Apical:Normal  Hyperkinetic      Lateral Wall Basal:Normal         Hyperkinetic                    HBZ:JIRCVE        Hyperkinetic                 Apical:Normal        Hyperkinetic    Posterior Wall Basal:Normal        Hyperkinetic                    LFY:BOFBPZ        Hyperkinetic     Inferior Wall Basal:Normal        Hyperkinetic                    WCH:ENIDPO        Hyperkinetic                 Apical:Normal        Hyperkinetic   Inferior Septum Basal:Normal        Hyperkinetic                    EUM:PNTIRW        Hyperkinetic              Resting EF:>55% (Est.)   Stress EF: >55% (Est.)   ___________________________________________________________________________________________   ADDITIONAL FINDINGS    ___________________________________________________________________________________________   STRESS ECG RESULTS                                                ECG Results:Normal   ___________________________________________________________________________________________  ECHOCARDIOGRAPHIC DESCRIPTIONS   LEFT VENTRICLE         Size:Normal  Contraction:Normal    LV Masses:No Masses          ERX:VQMG   RIGHT VENTRICLE         Size:Normal                Free Wall:Normal  Contraction:Normal                RV Masses:No mass   PERICARDIUM        Fluid:No effusion    _______________________________________________________________________________________  DOPPLER ECHO and OTHER SPECIAL PROCEDURES     Aortic:No AR                      No AS      Mitral:TRIVIAL MR                 No MS            MV Inflow E Vel=nm*        MV Annulus E'Vel=nm*            E/E'Ratio=nm*   Tricuspid:TRIVIAL TR                 No TS            136.0 cm/sec peak TR vel   12.4 mmHg peak RV pressure   Pulmonary:No PR                      No PS     ___________________________________________________________________________________________  ECHOCARDIOGRAPHIC MEASUREMENTS  2D DIMENSIONS  AORTA  Values      Normal Range      MAIN PA           Values      Normal Range            Annulus:  nm*       [2.1 - 2.5]                PA Main:  nm*       [1.5 - 2.1]          Aorta Sin:  nm*       [2.7 - 3.3]       RIGHT VENTRICLE        ST Junction:  nm*       [2.3 - 2.9]                RV Base:  nm*       [ < 4.2]          Asc.Aorta:  nm*       [2.3 - 3.1]                 RV Mid:  nm*       [ < 3.5]   LEFT VENTRICLE                                        RV Length:  nm*       [ < 8.6]              LVIDd:  nm*       [3.9 - 5.3]       INFERIOR VENA CAVA              LVIDs:  nm*                                 Max. IVC:  nm*       [ <= 2.1]                 FS:  nm*       [> 25]                    Min. IVC:  nm*                SWT:  nm*       [0.5 - 0.9]                   ------------------                PWT:  nm*       [0.5 - 0.9]                   nm* - not measured   LEFT ATRIUM            LA Diam:  nm*       [2.7 - 3.8]        LA A4C Area:  nm*       [ < 20]          LA Volume:  nm*       [22 - 52]    ___________________________________________________________________________________________  INTERPRETATION  Normal Stress Echocardiogram  NORMAL RIGHT VENTRICULAR SYSTOLIC FUNCTION  TRIVIAL REGURGITATION NOTED (See  above)  NO VALVULAR STENOSIS NOTED    ___________________________________________________________________________________________  Electronically signed by: MD Miquel Dunn on 02/15/2017 10:07 AM              Performed By: Johnathan Hausen, RDCS, RVT        Ordering Physician: Clabe Seal   TELEMETRY reviewed by me (LT) 07/28/2022 : Sinus rhythm rate 80s to 90s   EKG reviewed by me and Dr. Saralyn Pilar: 8/23 sinus tachycardia with rate 118 nonspecific T wave changes, repeat at 12:00 without significant changes. 1700 on 8/23, AF RVR with nonspecific ST changes but no stemi per Dr. Saralyn Pilar  Data reviewed by me (LT) 07/28/2022: Critical care note, surgery note, CBC, BMP, chest x-ray, telemetry, procalcitonin, surgical  pathology  ASSESSMENT AND PLAN:  Sarah [name] is a 24yoF with a PMH of hypertension, Graves' disease s/p XRT, type 2 diabetes, IBS, diverticulitis with abscess formation s/p CT-guided drain placement in early June 2023 who presented to [facility] for scheduled laparoscopic sigmoidectomy on 07/19/2022 that was ultimately converted to an open Hartman's procedure, now s/p colostomy. The evening following the procedure she reportedly became hypotensive and tachycardic with SBP in the 90s.  CCM was consulted and the patient was transferred to the ICU early morning of 8/23. Cardiology is consulted the afternoon of 8/23 due to concern for an abnormal EKG and elevated troponin which peaked at 13,300 on 8/24.  Echo shows a newly reduced ejection fraction to 45-50%, consistent with perioperative NSTEMI.  She also developed atrial fibrillation with RVR on 8/24.  #Sepsis 2/2 suspected intra-abdominal infection vs UTI #Diverticulitis s/p robotic sigmoidectomy converted to open Hartman's procedure on 07/19/22 #Invasive colorectal adenocarcinoma -Seen on surgical pathology, oncology work-up pending improvement in her respiratory status per surgery -Appreciate palliative involvement  #acute hypoxic respiratory failure Agree with current therapy per primary team & surgery  #perioperative NSTEMI Underwent scheduled robotic sigmoidectomy for diverticulitis on 8/22 with Dr. Lysle Pearl, the surgery ultimately was converted to an open Hartman's procedure and she is now s/p colostomy.  Overnight on the floor she became hypotensive and tachycardic, and was transferred to the ICU for closer monitoring.  Ultimately she did not require vasopressor support and blood pressures been stable throughout the day.  A troponin was checked which was significantly elevated at 7000, and repeat at 9500.  EKGs read as acute MI by the computer, confirmed with Dr. Clayborn Bigness not to be a STEMI, but with nonspecific T wave changes and poor R wave progression,  overall not significantly changed from prior in July 2023. -Concern for perioperative NSTEMI with troponin peak at 13,300 and newly reduced ejection fraction & LV global hypokinesis -S/p 325 mg aspirin, continue 81 mg aspirin daily  -hold heparin GTT with drop in H&H and ongoing anemia -S/p Neo-Synephrine -continue metoprolol tartrate '25mg'$  BID  -Hold home antihypertensives irbesartan 150 mg, amlodipine 10 mg and HCTZ 12.'5mg'$  -Continue atorvastatin 40 mg once daily -Monitor and replete electrolytes for a magnesium >2, potassium >4 -Continuous monitoring on telemetry while inpatient  She is clinically improving, hemoglobin stable, requiring less supplemental oxygen, and is able to lie flat during interview.  Discussed with the patient at length this morning risks, benefits, and alternatives of proceeding with left heart cath with Dr. Clayborn Bigness including risk of bleeding, heart attack, or death.  She is accepting of these risks and is agreeable to proceed.  We will make n.p.o. at midnight, she is on the schedule for 7:30 AM tomorrow, September 1.  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 BNP elevated at 1600, increasing to 3100. Somewhat edematous appearing in her upper extremities, although improving.  Chest x-ray without much change in aeration from 8/28 to 8/29, bilateral multifocal airspace opacities still present. - s/p IV lasix '20mg'$  x 2, '40mg'$  x 10 doses.  - continue lasix '40mg'$  IV BID for now - agree with acetazolamide  - continue GDMT with metoprolol '25mg'$  BID, lisinopril 2.'5mg'$   - Add low-dose spironolactone 12.5 mg once daily.   #New onset paroxysmal atrial fibrillation with RVR #hypothyroidism (History of Graves' disease s/p XRT) Developed the evening of 8/23, heart rates in the 160s, s/p amiodarone bolus x2.  -S/p amiodarone gtt.  -continue p.o. amiodarone 200 mg twice daily x 4 days, then 200 mg  daily thereafter starting 8/29. -stop heparin GTT with worsening anemia requiring transfusion, continue to hold off restarting this for now. CHA2DS2-VASc 6 (age, sex, chf, htn, MI) -thyroid panel: Slightly low TSH at 0.368, T4 within normal limits 4.9, T3 uptake slightly elevated at 43%  This patient's plan of care was discussed and created with Dr. Clayborn Bigness and he is in agreement.  Signed: Tristan Schroeder , PA-C 07/28/2022, 8:38 AM [facility]

## 2022-07-28 NOTE — Assessment & Plan Note (Signed)
Secondary to intra-abdominal infection and UTI.  Weaned off of pressor support.  Completed full course of antibiotics.

## 2022-07-28 NOTE — Evaluation (Signed)
Physical Therapy Evaluation Patient Details Name: Sarah [name] MRN: [id number] DOB: 1945/12/12 Today's Date: 07/28/2022  History of Present Illness  Patient is a 76 year old female with history of hypertension, Graves' disease, type 2 diabetes mellitus, IBS, diverticulitis, who presented for scheduled laparoscopic sigmoidectomy on 07/19/22. s/p robotic sigmoidectomy converted to open Hartman's procedure. Pathology results showing Invasive colorectal adenocarcinoma. She required ICU stay for hypotension and with perioperative NSTEMI.   Clinical Impression  Patient is agreeable to PT evaluation. She reports she uses her single point cane for ambulation at baseline. She lives with her spouse who is currently in respite care while she is in the hospital.  The patient currently needs maximal assistance for bed mobility. +2 person assistance for sit to stand transfer and for stand pivot transfer from bed to chair. Limited standing tolerance with knees buckling due to weakness. Generalized weakness throughout. Patient reports feeling good sitting up in the chair at end of session with vitals stable throughout mobility efforts. Recommend to continue PT to maximize independence and facilitate return to prior level of function. SNF recommended at discharge.       Recommendations for follow up therapy are one component of a multi-disciplinary discharge planning process, led by the attending physician.  Recommendations may be updated based on patient status, additional functional criteria and insurance authorization.  Follow Up Recommendations Skilled nursing-short term rehab (<3 hours/day) Can patient physically be transported by private vehicle: No    Assistance Recommended at Discharge Frequent or constant Supervision/Assistance  Patient can return home with the following  Two people to help with walking and/or transfers;A lot of help with bathing/dressing/bathroom;Help with stairs or ramp for  entrance;Assist for transportation;Assistance with cooking/housework    Equipment Recommendations Other (comment) (to be determined at next level of care)  Recommendations for Other Services       Functional Status Assessment Patient has had a recent decline in their functional status and demonstrates the ability to make significant improvements in function in a reasonable and predictable amount of time.     Precautions / Restrictions Precautions Precautions: Fall Restrictions Weight Bearing Restrictions: No      Mobility  Bed Mobility Overal bed mobility: Needs Assistance Bed Mobility: Supine to Sit     Supine to sit: Max assist     General bed mobility comments: assistance for trunk and BLE support. increased time and effort required    Transfers Overall transfer level: Needs assistance   Transfers: Sit to/from Stand Sit to Stand: Max assist, +2 physical assistance Stand pivot transfers: Max assist, +2 physical assistance         General transfer comment: verbal cues for hand placement and technique for standing    Ambulation/Gait               General Gait Details: unable to progress  Stairs            Wheelchair Mobility    Modified Rankin (Stroke Patients Only)       Balance Overall balance assessment: Needs assistance Sitting-balance support: Single extremity supported Sitting balance-Leahy Scale: Fair     Standing balance support: Bilateral upper extremity supported Standing balance-Leahy Scale: Poor Standing balance comment: external support required. poor standing tolerance with knees buckling                             Pertinent Vitals/Pain Pain Assessment Pain Assessment: Faces Faces Pain Scale: Hurts little more Pain  Location: buttocks Pain Descriptors / Indicators: Discomfort, Dull Pain Intervention(s): Limited activity within patient's tolerance, Monitored during session, Repositioned    Home Living  Family/patient expects to be discharged to:: Private residence Living Arrangements: Spouse/significant other (spouse currently at [facility] for respite while patient in the hospital) Available Help at Discharge: Family;Available PRN/intermittently   Home Access: Ramped entrance         Home Equipment: Rolling Walker (2 wheels);Cane - single point      Prior Function Prior Level of Function : Independent/Modified Independent             Mobility Comments: using single point cane       Hand Dominance   Dominant Hand: Right    Extremity/Trunk Assessment   Upper Extremity Assessment Upper Extremity Assessment: Generalized weakness    Lower Extremity Assessment Lower Extremity Assessment: Generalized weakness       Communication   Communication: No difficulties  Cognition Arousal/Alertness: Awake/alert Behavior During Therapy: WFL for tasks assessed/performed Overall Cognitive Status: Impaired/Different from baseline Area of Impairment: Problem solving, Safety/judgement                         Safety/Judgement: Decreased awareness of safety, Decreased awareness of deficits   Problem Solving: Requires verbal cues, Requires tactile cues, Slow processing          General Comments General comments (skin integrity, edema, etc.): vitals stable throughout on 4 L02    Exercises     Assessment/Plan    PT Assessment Patient needs continued PT services  PT Problem List Decreased strength;Decreased activity tolerance;Decreased balance;Decreased mobility       PT Treatment Interventions DME instruction;Gait training;Functional mobility training;Therapeutic activities;Therapeutic exercise;Balance training;Neuromuscular re-education;Cognitive remediation;Patient/family education    PT Goals (Current goals can be found in the Care Plan section)  Acute Rehab PT Goals Patient Stated Goal: to get back home PT Goal Formulation: With patient Time For Goal  Achievement: 08/11/22 Potential to Achieve Goals: Fair    Frequency Min 2X/week     Co-evaluation PT/OT/SLP Co-Evaluation/Treatment: Yes Reason for Co-Treatment: Complexity of the patient's impairments (multi-system involvement);To address functional/ADL transfers PT goals addressed during session: Mobility/safety with mobility         AM-PAC PT "6 Clicks" Mobility  Outcome Measure Help needed turning from your back to your side while in a flat bed without using bedrails?: A Lot Help needed moving from lying on your back to sitting on the side of a flat bed without using bedrails?: A Lot Help needed moving to and from a bed to a chair (including a wheelchair)?: Total Help needed standing up from a chair using your arms (e.g., wheelchair or bedside chair)?: Total Help needed to walk in hospital room?: Total Help needed climbing 3-5 steps with a railing? : Total 6 Click Score: 8    End of Session Equipment Utilized During Treatment: Oxygen Activity Tolerance: Patient tolerated treatment well Patient left: in chair;with call bell/phone within reach Nurse Communication: Mobility status PT Visit Diagnosis: Unsteadiness on feet (R26.81);Muscle weakness (generalized) (M62.81)    Time: 1497-0263 PT Time Calculation (min) (ACUTE ONLY): 20 min   Charges:   PT Evaluation $PT Eval Moderate Complexity: 1 Mod          Sarah [name], PT, MPT  Percell Locus 07/28/2022, 1:29 PM

## 2022-07-28 NOTE — Assessment & Plan Note (Addendum)
Meets criteria BMI greater than 25.  Please note that patient's weight has significantly decreased following diuresis and therefore, BMI is no longer above 25.

## 2022-07-28 NOTE — NC FL2 (Signed)
[place] LEVEL OF CARE SCREENING TOOL     IDENTIFICATION  Patient Name: Sarah [name] Birthdate: [birth year] Sex: female Admission Date (Current Location): 07/19/2022  [facility] and [place] Number:  Engineering geologist and Address:  [facility], [address], [place], [place] [address]      Provider Number: [id number]  Attending Physician Name and Address:  Annita Brod, MD  Relative Name and Phone Number:  Yuette Putnam- brother- [phone]    Current Level of Care: Hospital Recommended Level of Care: [employer] Prior Approval Number:    Date Approved/Denied:   PASRR Number: [id number]  Discharge Plan: SNF    Current Diagnoses: Patient Active Problem List   Diagnosis Date Noted   Overweight (BMI 25.0-29.9) 07/28/2022   Protein-calorie malnutrition, severe 07/23/2022   Pressure injury of skin 07/23/2022   NSTEMI (non-ST elevated myocardial infarction) ([place]) 34/19/6222   Acute systolic CHF (congestive heart failure) ([place]) 07/23/2022   Atrial fibrillation with RVR ([place]) 07/23/2022   Hypophosphatemia 07/23/2022   Hyponatremia 07/23/2022   Uncontrolled type 2 diabetes mellitus with hyperglycemia, without long-term current use of insulin ([place]) 07/23/2022   Hypokalemia 07/23/2022   Hypomagnesemia 07/23/2022   Acute hypoxemic respiratory failure ([place]) 07/23/2022   Diverticulitis 07/19/2022   Diverticulitis large intestine 05/28/2022    Orientation RESPIRATION BLADDER Height & Weight     Self, Situation, Place  O2 External catheter Weight: 66.9 kg Height:  5' 2.99" (160 cm)  BEHAVIORAL SYMPTOMS/MOOD NEUROLOGICAL BOWEL NUTRITION STATUS      Colostomy Diet (see discharge summary)  AMBULATORY STATUS COMMUNICATION OF NEEDS Skin   Limited Assist Verbally PU Stage and Appropriate Care, Surgical wounds (Abd incision closed)                       Personal Care Assistance Level of Assistance  Bathing, Feeding,  Dressing Bathing Assistance: Limited assistance Feeding assistance: Limited assistance Dressing Assistance: Limited assistance     Functional Limitations Info  Sight, Hearing, Speech Sight Info: Adequate Hearing Info: Adequate Speech Info: Adequate    SPECIAL CARE FACTORS FREQUENCY  PT (By licensed PT), OT (By licensed OT)     PT Frequency: 5 times per week OT Frequency: 5 times per week            Contractures Contractures Info: Not present    Additional Factors Info  Code Status, Allergies Code Status Info: Full Allergies Info: Penicillins, Sulfa antibiotics           Current Medications (07/28/2022):  This is the current hospital active medication list Current Facility-Administered Medications  Medication Dose Route Frequency Provider Last Rate Last Admin   0.9 %  sodium chloride infusion (Manually program via Guardrails IV Fluids)   Intravenous Once Rust-Chester, Huel Cote, NP       ALPRAZolam Duanne Moron) tablet 0.25 mg  0.25 mg Oral TID PRN Teressa Lower, NP   0.25 mg at 07/26/22 2121   amiodarone (PACERONE) tablet 200 mg  200 mg Oral Daily Tristan Schroeder, PA-C   200 mg at 07/28/22 1109   ascorbic acid (VITAMIN C) tablet 500 mg  500 mg Oral BID Annita Brod, MD       aspirin chewable tablet 81 mg  81 mg Oral Daily Tristan Schroeder, PA-C   81 mg at 07/28/22 1104   atorvastatin (LIPITOR) tablet 40 mg  40 mg Oral QHS Tang, Alanson Puls, PA-C   40 mg at  07/27/22 2148   Chlorhexidine Gluconate Cloth 2 % PADS 6 each  6 each Topical Daily Sakai, Isami, DO   6 each at 07/28/22 1347   enoxaparin (LOVENOX) injection 40 mg  40 mg Subcutaneous Q24H Lorna Dibble, RPH   40 mg at 07/27/22 2148   feeding supplement (ENSURE ENLIVE / ENSURE PLUS) liquid 237 mL  237 mL Oral TID BM Nelle Don, MD   237 mL at 07/28/22 1302   furosemide (LASIX) injection 40 mg  40 mg Intravenous Q12H Darel Hong D, NP   40 mg at 07/28/22 0537   gabapentin (NEURONTIN) capsule 300  mg  300 mg Oral BID Lysle Pearl, Isami, DO   300 mg at 07/28/22 1104   HYDROmorphone (DILAUDID) injection 0.5 mg  0.5 mg Intravenous Q3H PRN Nelle Don, MD   0.5 mg at 07/25/22 1247   insulin aspart (novoLOG) injection 0-20 Units  0-20 Units Subcutaneous Q4H Rust-Chester, Britton L, NP   7 Units at 07/28/22 1156   insulin aspart (novoLOG) injection 5 Units  5 Units Subcutaneous TID WC Rust-Chester, Huel Cote, NP   5 Units at 07/28/22 1156   levalbuterol (XOPENEX) nebulizer solution 0.63 mg  0.63 mg Nebulization BID Annita Brod, MD       And   ipratropium (ATROVENT) nebulizer solution 0.5 mg  0.5 mg Nebulization BID Annita Brod, MD       ipratropium-albuterol (DUONEB) 0.5-2.5 (3) MG/3ML nebulizer solution 3 mL  3 mL Nebulization Q6H PRN Lang Snow, NP   3 mL at 07/21/22 2045   lisinopril (ZESTRIL) tablet 2.5 mg  2.5 mg Oral Daily Tang, Alanson Puls, PA-C   2.5 mg at 07/28/22 1104   methylPREDNISolone sodium succinate (SOLU-MEDROL) 40 mg/mL injection 40 mg  40 mg Intravenous Q12H Darel Hong D, NP   40 mg at 07/28/22 0537   metoprolol tartrate (LOPRESSOR) tablet 25 mg  25 mg Oral BID Sharen Hones, MD   25 mg at 07/28/22 1104   multivitamin with minerals tablet 1 tablet  1 tablet Oral Daily Flora Lipps, MD   1 tablet at 07/28/22 1104   ondansetron (ZOFRAN-ODT) disintegrating tablet 4 mg  4 mg Oral Q6H PRN Lysle Pearl, Isami, DO       Or   ondansetron (ZOFRAN) injection 4 mg  4 mg Intravenous Q6H PRN Sakai, Isami, DO   4 mg at 07/23/22 1035   Oral care mouth rinse  15 mL Mouth Rinse 4 times per day Flora Lipps, MD   15 mL at 07/28/22 1156   Oral care mouth rinse  15 mL Mouth Rinse PRN Flora Lipps, MD       oxyCODONE-acetaminophen (PERCOCET/ROXICET) 5-325 MG per tablet 1 tablet  1 tablet Oral Q6H PRN Teressa Lower, NP   1 tablet at 07/25/22 0746   pantoprazole (PROTONIX) injection 40 mg  40 mg Intravenous QHS Sakai, Isami, DO   40 mg at 07/27/22 2148   sertraline (ZOLOFT)  tablet 100 mg  100 mg Oral Daily Dallie Piles, RPH   100 mg at 07/28/22 1104   sodium chloride flush (NS) 0.9 % injection 10-40 mL  10-40 mL Intracatheter Q12H Kasa, Maretta Bees, MD   10 mL at 07/28/22 1107   sodium chloride flush (NS) 0.9 % injection 10-40 mL  10-40 mL Intracatheter PRN Flora Lipps, MD       spironolactone (ALDACTONE) tablet 12.5 mg  12.5 mg Oral Daily Tristan Schroeder, PA-C   12.5 mg at 07/28/22  1104   venlafaxine (EFFEXOR) tablet 37.5 mg  37.5 mg Oral Q breakfast Sakai, Isami, DO   37.5 mg at 07/28/22 4854   zinc sulfate capsule 220 mg  220 mg Oral Daily Annita Brod, MD         Discharge Medications: Please see discharge summary for a list of discharge medications.  Relevant Imaging Results:  Relevant Lab Results:   Additional Information SS# [id number]  Shelbie Hutching, RN

## 2022-07-28 NOTE — Evaluation (Signed)
Occupational Therapy Evaluation Patient Details Name: Sarah [name] MRN: [id number] DOB: May 18, 1946 Today's Date: 07/28/2022   History of Present Illness Sarah [name] is a 61yoF with a PMH of hypertension, Graves' disease s/p XRT, type 2 diabetes, IBS, diverticulitis with abscess formation s/p CT-guided drain placement in early June 2023 who presented to [facility] for scheduled laparoscopic sigmoidectomy on 07/19/2022.   Clinical Impression   Ms Cones was seen for OT evaluation this date. Prior to hospital admission, pt was MOD I for mobility and ADLs. Pt lives alone (caregiver for spouse who is on hospice respite care). Pt presents to acute OT demonstrating impaired ADL performance and functional mobility 2/2 decreased activity tolerance and functional strength/ROM/balance deficits. Pt currently requires MOD A for LB access in long sitting. MAX A bed mobility. SETUP + SUPERVISION seated grooming tasks. MAX A x2 + SPC for SPT bed>chair, difficulty coordinating steps. Pt would benefit from skilled OT to address noted impairments and functional limitations (see below for any additional details). Upon hospital discharge, recommend STR to maximize pt safety and return to PLOF.    Recommendations for follow up therapy are one component of a multi-disciplinary discharge planning process, led by the attending physician.  Recommendations may be updated based on patient status, additional functional criteria and insurance authorization.   Follow Up Recommendations  Skilled nursing-short term rehab (<3 hours/day)    Assistance Recommended at Discharge Frequent or constant Supervision/Assistance  Patient can return home with the following Two people to help with walking and/or transfers;Two people to help with bathing/dressing/bathroom    Functional Status Assessment  Patient has had a recent decline in their functional status and demonstrates the ability to make significant improvements in function in a reasonable  and predictable amount of time.  Equipment Recommendations  Other (comment) (defer)    Recommendations for Other Services       Precautions / Restrictions Precautions Precautions: Fall Restrictions Weight Bearing Restrictions: No      Mobility Bed Mobility Overal bed mobility: Needs Assistance Bed Mobility: Supine to Sit     Supine to sit: Max assist          Transfers Overall transfer level: Needs assistance   Transfers: Sit to/from Stand, Bed to chair/wheelchair/BSC Sit to Stand: Max assist, +2 physical assistance Stand pivot transfers: Max assist, +2 physical assistance                Balance Overall balance assessment: Needs assistance Sitting-balance support: Single extremity supported, Feet supported Sitting balance-Leahy Scale: Fair     Standing balance support: Bilateral upper extremity supported Standing balance-Leahy Scale: Poor                             ADL either performed or assessed with clinical judgement   ADL Overall ADL's : Needs assistance/impaired                                       General ADL Comments: MOD A for LB access in long sitting. SETUP + SUPERVISION seated grooming tasks. MAX A x2 + SPC for simulated BSC t/f      Pertinent Vitals/Pain Pain Assessment Pain Assessment: Faces Faces Pain Scale: Hurts little more Pain Location: buttocks Pain Descriptors / Indicators: Discomfort, Dull Pain Intervention(s): Limited activity within patient's tolerance     Hand Dominance Right   Extremity/Trunk  Assessment Upper Extremity Assessment Upper Extremity Assessment: Generalized weakness   Lower Extremity Assessment Lower Extremity Assessment: Generalized weakness       Communication Communication Communication: No difficulties   Cognition Arousal/Alertness: Awake/alert Behavior During Therapy: WFL for tasks assessed/performed Overall Cognitive Status: Impaired/Different from  baseline Area of Impairment: Problem solving, Safety/judgement                         Safety/Judgement: Decreased awareness of safety, Decreased awareness of deficits   Problem Solving: Requires verbal cues, Requires tactile cues, Slow processing                  Home Living Family/patient expects to be discharged to:: Private residence Living Arrangements: Alone Available Help at Discharge: Family;Available PRN/intermittently                         Home Equipment: Rolling Walker (2 wheels);Wheelchair - manual;Cane - single point          Prior Functioning/Environment Prior Level of Function : Independent/Modified Independent             Mobility Comments: use of SPC, caregiver for husband on hospice          OT Problem List: Decreased strength;Decreased range of motion;Decreased activity tolerance;Impaired balance (sitting and/or standing);Decreased safety awareness      OT Treatment/Interventions: Therapeutic exercise;Self-care/ADL training;Energy conservation;DME and/or AE instruction;Therapeutic activities;Patient/family education;Balance training    OT Goals(Current goals can be found in the care plan section) Acute Rehab OT Goals Patient Stated Goal: to go home OT Goal Formulation: With patient Time For Goal Achievement: 08/11/22 Potential to Achieve Goals: Fair ADL Goals Pt Will Perform Grooming: with min assist;standing Pt Will Perform Lower Body Dressing: with min assist;sit to/from stand Pt Will Transfer to Toilet: ambulating;bedside commode;with mod assist  OT Frequency: Min 2X/week    Co-evaluation              AM-PAC OT "6 Clicks" Daily Activity     Outcome Measure Help from another person eating meals?: None Help from another person taking care of personal grooming?: A Little Help from another person toileting, which includes using toliet, bedpan, or urinal?: A Lot Help from another person bathing (including  washing, rinsing, drying)?: A Lot Help from another person to put on and taking off regular upper body clothing?: A Little Help from another person to put on and taking off regular lower body clothing?: A Lot 6 Click Score: 16   End of Session Nurse Communication: Mobility status  Activity Tolerance: Patient tolerated treatment well Patient left: in chair;with call bell/phone within reach  OT Visit Diagnosis: Other abnormalities of gait and mobility (R26.89);Muscle weakness (generalized) (M62.81)                Time: 1062-6948 OT Time Calculation (min): 16 min Charges:  OT General Charges $OT Visit: 1 Visit OT Evaluation $OT Eval High Complexity: 1 High  Dessie Coma, M.S. OTR/L  07/28/22, 11:42 AM  ascom [phone]

## 2022-07-28 NOTE — Progress Notes (Signed)
[facility] Progress Note  Patient: Sarah [name]    HEN:[id number]  DOA: 07/19/2022    Date of Service: the patient was seen and examined on 07/28/2022  Brief hospital course: 76 year old female with past medical history of Graves' disease status post radiation therapy, diabetes mellitus, hypertension and diverticulitis with abscess formation status post CT-guided drain placement a few months ago who presented on 8/22 for a scheduled laparoscopic sigmoidectomy.  Patient underwent procedure which was converted to open Hartman's procedure.  Postop, patient developed atrial fibrillation with RVR, hypotension and non-STEMI.  Placed in the ICU for hypotension on pressor support.  Cardiology consulted and patient started on amiodarone drip.  Also developed acute on chronic congestive heart failure with an echocardiogram noting ejection fraction of 45 to 50%.    By 8/25, patient able to be weaned off of pressor support.  Over next few days, patient had worsening respiratory status initially requiring BiPAP and then transition to heated high flow nasal cannula.  Palliative care consulted.  By 8/30, patient's oxygenation able to be weaned down and currently on 4 L nasal cannula.  Seen by cardiology plans take patient for cardiac catheterization 9/1.  To date, patient has diuresed over 5.5 L of fluid.  General surgery removed JP drain.  Wound exposed and packed with wet-to-dry dressing.  PT and OT recommending skilled nursing once patient leaves the hospital.  Assessment and Plan: Assessment and Plan: * Diverticulitis Status post partial sigmoidectomy along with Hartman's pouch.  General surgery following.  Has removed JP drain and now with wound care open wound and wound packing.  Acute hypoxemic respiratory failure (HCC) Secondary to CHF and possibly HCAP.  Continues to do well and oxygen weaned, currently at 4 L nasal cannula.  NSTEMI (non-ST elevated myocardial infarction) [facility]) Cardiology plans to  take patient for cardiac catheterization 9/1.  Continue aspirin and Lipitor.  Septic shock (HCC)-resolved as of 07/28/2022 Secondary to intra-abdominal infection and UTI.  Weaned off of pressor support.  Completed full course of antibiotics.  Acute systolic CHF (congestive heart failure) ([place]) Responding well to diuretics, has diuresed -5 L deficient.  Almost 10 pounds less  Uncontrolled type 2 diabetes mellitus with hyperglycemia, without long-term current use of insulin (HCC) A1C several months ago at 8.3.  Currently on sliding scale only as well as steroids  Atrial fibrillation with RVR ([place]) Presented when patient developed sepsis.  Continue amiodarone.  Rate controlled.  Currently Lovenox at prophylaxis dose due to patient's open wounds.  Protein-calorie malnutrition, severe Nutrition Status: Nutrition Problem: Severe Malnutrition Etiology: acute illness Signs/Symptoms: percent weight loss, severe fat depletion, severe muscle depletion Percent weight loss: 21 %       Electrolyte abnormality including hyponatremia, hypokalemia, hypophosphatemia and hypomagnesemia Replacing as needed.  Pressure injury of skin Pressure Injury 07/21/22 Sacrum Bilateral;Mid Stage 2 -  Partial thickness loss of dermis presenting as a shallow open injury with a red, pink wound bed without slough. non-blanchable and blanchable redness with skin tear (Active)  07/21/22 1400  Location: Sacrum  Location Orientation: Bilateral;Mid  Staging: Stage 2 -  Partial thickness loss of dermis presenting as a shallow open injury with a red, pink wound bed without slough.  Wound Description (Comments): non-blanchable and blanchable redness with skin tear  Present on Admission: Yes (patient states she thinks she has had this wound from sitting in her recliner at home.)    Stage II sacral ulcer, present on admission  Overweight (BMI 25.0-29.9) Meets criteria BMI greater than  25       Body mass index is  26.13 kg/m.  Nutrition Problem: Severe Malnutrition Etiology: acute illness Pressure Injury 07/21/22 Sacrum Bilateral;Mid Stage 2 -  Partial thickness loss of dermis presenting as a shallow open injury with a red, pink wound bed without slough. non-blanchable and blanchable redness with skin tear (Active)  07/21/22 1400  Location: Sacrum  Location Orientation: Bilateral;Mid  Staging: Stage 2 -  Partial thickness loss of dermis presenting as a shallow open injury with a red, pink wound bed without slough.  Wound Description (Comments): non-blanchable and blanchable redness with skin tear  Present on Admission: Yes  Dressing Type Foam - Lift dressing to assess site every shift 07/28/22 0749     Consultants: Cardiology Surgery Critical care Wound care Diabetes coordinator  Procedures: Status post partial sigmoidectomy with conversion to Hartman's pouch Pressor support  Antimicrobials: Antibiotics Given (last 72 hours)     Date/Time Action Medication Dose Rate   07/25/22 1859 New Bag/Given   meropenem (MERREM) 1 g in sodium chloride 0.9 % 100 mL IVPB 1 g 200 mL/hr   07/26/22 8127 New Bag/Given   meropenem (MERREM) 1 g in sodium chloride 0.9 % 100 mL IVPB 1 g 200 mL/hr   07/26/22 1008 New Bag/Given   meropenem (MERREM) 1 g in sodium chloride 0.9 % 100 mL IVPB 1 g 200 mL/hr       Code Status: Full code   Subjective: Patient with no complaints, tired  Objective: Vital signs were reviewed and unremarkable. Vitals:   07/28/22 1421 07/28/22 1500  BP:  (!) 122/58  Pulse: 77 76  Resp: 16 (!) 24  Temp:    SpO2: 96% 96%    Intake/Output Summary (Last 24 hours) at 07/28/2022 1551 Last data filed at 07/28/2022 1347 Gross per 24 hour  Intake 1530 ml  Output 2232.5 ml  Net -702.5 ml   Filed Weights   07/26/22 1000 07/27/22 0500 07/28/22 0600  Weight: 70.3 kg 67.8 kg 66.9 kg   Body mass index is 26.13 kg/m.  Exam:  General: Alert and oriented x3, no acute  distress HEENT: Normocephalic, atraumatic, mucous membranes slightly dry Cardiovascular: Irregular rhythm, rate controlled Respiratory: Decreased breath sounds bibasilar Abdomen: Abdominal wound packed Musculoskeletal: No clubbing or cyanosis, 1+ pitting edema bilaterally Skin: No skin breaks, tears or lesions other than abdominal wound as described above Psychiatry: Appropriate, no evidence of psychoses Neurology: No focal deficits  Data Reviewed: Noted albumin of 2.3.  Hemoglobin stable at 9.  Disposition:  Status is: Inpatient Remains inpatient appropriate because: Plan cardiac catheterization, further diuresis, abdominal wound management    Anticipated discharge date: 9/5  Family Communication: Left message for family DVT Prophylaxis: enoxaparin (LOVENOX) injection 40 mg Start: 07/26/22 2200 Place and maintain sequential compression device Start: 07/25/22 1048 SCD's Start: 07/19/22 2042    Author: Annita Brod ,MD 07/28/2022 3:51 PM  To reach On-call, see care teams to locate the attending and reach out via [email or web address]. Between 7PM-7AM, please contact night-coverage If you still have difficulty reaching the attending provider, please page the [facility] (Director on Call) for [facility] on amion for assistance.

## 2022-07-28 NOTE — Assessment & Plan Note (Addendum)
Presented when patient developed sepsis.  Continue amiodarone.  Rate controlled.  Would advise starting Eliquis and after next general surgery appointment and is cleared by general surgery, allowing abdominal wound to heal further.

## 2022-07-28 NOTE — TOC Progression Note (Signed)
Transition of Care [facility]) - Progression Note    Patient Details  Name: SURIYA KOVARIK MRN: [id number] Date of Birth: 13-Jul-1946  [facility] CM/SW Contact  Shelbie Hutching, RN Phone Number: 07/28/2022, 3:27 PM  Clinical Narrative:    RNCM met with patient at the bedside today, she is sitting up in the chair.  She did not know if it was day or night so reoriented to day time.  Patient is agreeable to SNF placement at discharge.  RNCM will begin bed search, she liked the idea of [facility].     Expected Discharge Plan: [place] Barriers to Discharge: Continued Medical Work up  Expected Discharge Plan and Services Expected Discharge Plan: [place]   Discharge Planning Services: CM Consult Post Acute Care Choice: [place] Living arrangements for the past 2 months: Single Family Home                 DME Arranged: N/A                     Social Determinants of Health (SDOH) Interventions    Readmission Risk Interventions    07/28/2022    3:27 PM  Readmission Risk Prevention Plan  Transportation Screening Complete  PCP or Specialist Appt within 3-5 Days Complete  HRI or [place] Complete  Social Work Consult for [place] Planning/Counseling Complete  Palliative Care Screening Complete  Medication Review [employer]) Complete

## 2022-07-28 NOTE — Assessment & Plan Note (Addendum)
Secondary to CHF and possibly HCAP.  Weaned off of oxygen, on room air

## 2022-07-28 NOTE — Assessment & Plan Note (Addendum)
Pressure Injury 07/21/22 Sacrum Bilateral;Mid Stage 2 -  Partial thickness loss of dermis presenting as a shallow open injury with a red, pink wound bed without slough. non-blanchable and blanchable redness with skin tear (Active)  07/21/22 1400  Location: Sacrum  Location Orientation: Bilateral;Mid  Staging: Stage 2 -  Partial thickness loss of dermis presenting as a shallow open injury with a red, pink wound bed without slough.  Wound Description (Comments): non-blanchable and blanchable redness with skin tear  Present on Admission: Yes (patient states she thinks she has had this wound from sitting in her recliner at home.)    Stage II sacral ulcer, present on admission.  Appreciate wound care help.  Sacral foam, changed every 3 days, at skilled nursing facility

## 2022-07-28 NOTE — Assessment & Plan Note (Addendum)
A1C several months ago at 8.3.  Currently on sliding scale only as well as steroids.  Metformin discontinued secondary to heart failure.  Amaryl to be resumed.  CBGs will improve once prednisone discontinued.  To consider adding additional oral medications, but can defer this to the attending physician at her skilled nursing facility.  Started on gabapentin for peripheral neuropathy.  Dose decreased prior to discharge due to increased drowsiness.

## 2022-07-28 NOTE — Assessment & Plan Note (Addendum)
Responding well to diuretics, has diuresed over 9 L of fluid and is -8.5 L deficient 15 pounds less.  Follow renal function.  Looks to be leveling off.

## 2022-07-28 NOTE — Assessment & Plan Note (Signed)
Replacing as needed 

## 2022-07-28 NOTE — Progress Notes (Signed)
Subjective:  CC: Sarah [name] is a 76 y.o. female  Hospital stay day 9, 9 Days Post-Op robotic sigmoidectomy converted to open Hartman's procedure  HPI: No acute issues overnight.  Soft diet.  ROS:  General: Denies weight loss, weight gain, fatigue, fevers, chills, and night sweats. Heart: Denies chest pain, palpitations, racing heart, irregular heartbeat, leg pain or swelling, and decreased activity tolerance. Respiratory: Denies cough, and sputum. GI: Denies change in appetite, heartburn, nausea, vomiting, constipation, diarrhea, and blood in stool. GU: Denies difficulty urinating, pain with urinating, urgency, frequency, blood in urine.   Objective:   Temp:  [96.5 F (35.8 C)-97.8 F (36.6 C)] 97.8 F (36.6 C) (08/31 1157) Pulse Rate:  [75-92] 76 (08/31 1500) Resp:  [12-29] 24 (08/31 1500) BP: (106-129)/(58-82) 122/58 (08/31 1500) SpO2:  [91 %-100 %] 96 % (08/31 1500) Weight:  [66.9 kg] 66.9 kg (08/31 0600)     Height: 5' 2.99" (160 cm) Weight: 66.9 kg BMI (Calculated): 26.13   Intake/Output this shift:   Intake/Output Summary (Last 24 hours) at 07/28/2022 1508 Last data filed at 07/28/2022 1347 Gross per 24 hour  Intake 1530 ml  Output 2232.5 ml  Net -702.5 ml    Constitutional :  alert, cooperative, appears stated age, and no distress  Respiratory:  clear to auscultation bilaterally  Cardiovascular:  Tachycardic  Gastrointestinal: Soft, no guarding, minima TTP . Ostomy pink, patent, stool in bag  Skin: Cool and moist. Port site incisions healed.  Midline now with purulent drainage  Psychiatric: Normal affect, non-agitated, not confused       LABS:     Latest Ref Rng & Units 07/28/2022    5:14 AM 07/27/2022    5:01 AM 07/26/2022    4:58 AM  CMP  Glucose 70 - 99 mg/dL 153  239  125   BUN 8 - 23 mg/dL 40  39  31   Creatinine 0.44 - 1.00 mg/dL 0.60  0.68  0.55   Sodium 135 - 145 mmol/L 141  140  142   Potassium 3.5 - 5.1 mmol/L 3.5  4.1  4.3   Chloride 98 - 111  mmol/L 106  106  108   CO2 22 - 32 mmol/L '30  29  28   '$ Calcium 8.9 - 10.3 mg/dL 8.0  7.8  7.9   Total Protein 6.5 - 8.1 g/dL 5.3     Total Bilirubin 0.3 - 1.2 mg/dL 0.5     Alkaline Phos 38 - 126 U/L 72     AST 15 - 41 U/L 62     ALT 0 - 44 U/L 36         Latest Ref Rng & Units 07/28/2022    5:14 AM 07/27/2022    8:54 AM 07/26/2022    4:58 AM  CBC  WBC 4.0 - 10.5 K/uL 8.1  12.5  10.5   Hemoglobin 12.0 - 15.0 g/dL 9.0  8.9  8.2   Hematocrit 36.0 - 46.0 % 30.1  29.3  26.5   Platelets 150 - 400 K/uL 281  306  294     RADS: N/a Assessment:   S/p robotic sigmoidectomy converted to open Hartman's procedure for complicated diverticulitis.  JP removed at bedside.  Port site staples removed and some staples removed from midline to fully evacuate purulent drainage.  Open wound then packed with wet to dry.  Will continue wet to dry daily until healed.  Continue further care per ICU, cardiology.  Surgery will continue to  see intermittently while inhouse for wound check.  labs/images/medications/previous chart entries reviewed personally and relevant changes/updates noted above.

## 2022-07-28 NOTE — Assessment & Plan Note (Addendum)
Status post cardiac catheterization with ejection fraction of 25% noted.  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  Not a candidate for CABG.  For now, medical management with plans for future intervention if possible.  Continue aspirin and Lipitor.

## 2022-07-29 ENCOUNTER — Encounter
Admission: RE | Disposition: A | Discharge status: Skilled Nursing Facility | Payer: Self-pay | Source: Home / Self Care | Attending: Internal Medicine

## 2022-07-29 ENCOUNTER — Encounter: Payer: Self-pay | Admitting: Surgery

## 2022-07-29 DIAGNOSIS — E43 Unspecified severe protein-calorie malnutrition: Secondary | ICD-10-CM

## 2022-07-29 HISTORY — PX: LEFT HEART CATH AND CORONARY ANGIOGRAPHY: CATH118249

## 2022-07-29 LAB — PHOSPHORUS: Phosphorus: 2.3 mg/dL — ABNORMAL LOW (ref 2.5–4.6)

## 2022-07-29 LAB — BASIC METABOLIC PANEL
Anion gap: 7 (ref 5–15)
BUN: 35 mg/dL — ABNORMAL HIGH (ref 8–23)
CO2: 31 mmol/L (ref 22–32)
Calcium: 7.9 mg/dL — ABNORMAL LOW (ref 8.9–10.3)
Chloride: 102 mmol/L (ref 98–111)
Creatinine, Ser: 0.48 mg/dL (ref 0.44–1.00)
GFR, Estimated: >60 mL/min (ref >60)
Glucose, Bld: 221 mg/dL — ABNORMAL HIGH (ref 70–99)
Potassium: 3.7 mmol/L (ref 3.5–5.1)
Sodium: 140 mmol/L (ref 135–145)

## 2022-07-29 LAB — GLUCOSE, CAPILLARY
Glucose-Capillary: 129 mg/dL — ABNORMAL HIGH (ref 70–99)
Glucose-Capillary: 158 mg/dL — ABNORMAL HIGH (ref 70–99)
Glucose-Capillary: 191 mg/dL — ABNORMAL HIGH (ref 70–99)
Glucose-Capillary: 223 mg/dL — ABNORMAL HIGH (ref 70–99)
Glucose-Capillary: 228 mg/dL — ABNORMAL HIGH (ref 70–99)
Glucose-Capillary: 246 mg/dL — ABNORMAL HIGH (ref 70–99)

## 2022-07-29 LAB — CBC
HCT: 33.1 % — ABNORMAL LOW (ref 36.0–46.0)
Hemoglobin: 10.4 g/dL — ABNORMAL LOW (ref 12.0–15.0)
MCH: 28.4 pg (ref 26.0–34.0)
MCHC: 31.4 g/dL (ref 30.0–36.0)
MCV: 90.4 fL (ref 80.0–100.0)
Platelets: 337 10*3/uL (ref 150–400)
RBC: 3.66 MIL/uL — ABNORMAL LOW (ref 3.87–5.11)
RDW: 18.6 % — ABNORMAL HIGH (ref 11.5–15.5)
WBC: 11.2 10*3/uL — ABNORMAL HIGH (ref 4.0–10.5)
nRBC: 0 % (ref 0.0–0.2)

## 2022-07-29 LAB — MAGNESIUM: Magnesium: 1.8 mg/dL (ref 1.7–2.4)

## 2022-07-29 LAB — BRAIN NATRIURETIC PEPTIDE: B Natriuretic Peptide: 4045.5 pg/mL — ABNORMAL HIGH (ref 0.0–100.0)

## 2022-07-29 SURGERY — LEFT HEART CATH AND CORONARY ANGIOGRAPHY
Anesthesia: Moderate Sedation

## 2022-07-29 MED ORDER — SODIUM CHLORIDE 0.9 % IV SOLN
250.0000 mL | INTRAVENOUS | Status: DC | PRN
  Administered 2022-07-30: 250 mL via INTRAVENOUS

## 2022-07-29 MED ORDER — SODIUM CHLORIDE 0.9% FLUSH: 3.0000 mL | INTRAVENOUS | Status: DC | PRN

## 2022-07-29 MED ORDER — LABETALOL HCL 5 MG/ML IV SOLN: 10.0000 mg | INTRAVENOUS | Status: AC | PRN

## 2022-07-29 MED ORDER — HYDRALAZINE HCL 20 MG/ML IJ SOLN: 10.0000 mg | INTRAMUSCULAR | Status: AC | PRN

## 2022-07-29 MED ORDER — HEPARIN (PORCINE) IN NACL 1000-0.9 UT/500ML-% IV SOLN
INTRAVENOUS | Status: DC | PRN
  Administered 2022-07-29 (×2): 500 mL

## 2022-07-29 MED ORDER — HEPARIN SODIUM (PORCINE) 1000 UNIT/ML IJ SOLN
INTRAMUSCULAR | Status: DC | PRN
  Administered 2022-07-29: 3500 [IU] via INTRAVENOUS

## 2022-07-29 MED ORDER — SODIUM CHLORIDE 0.9% FLUSH
3.0000 mL | Freq: Two times a day (BID) | INTRAVENOUS | Status: DC
  Administered 2022-07-29 – 2022-08-01 (×6): 3 mL via INTRAVENOUS

## 2022-07-29 MED ORDER — IOHEXOL 300 MG/ML  SOLN
INTRAMUSCULAR | Status: DC | PRN
  Administered 2022-07-29: 55 mL

## 2022-07-29 MED ORDER — MIDAZOLAM HCL 2 MG/2ML IJ SOLN
INTRAMUSCULAR | Status: AC
  Filled 2022-07-29: qty 2

## 2022-07-29 MED ORDER — HEPARIN SODIUM (PORCINE) 1000 UNIT/ML IJ SOLN
INTRAMUSCULAR | Status: AC
  Filled 2022-07-29: qty 10

## 2022-07-29 MED ORDER — ONDANSETRON HCL 4 MG/2ML IJ SOLN: 4.0000 mg | Freq: Four times a day (QID) | INTRAMUSCULAR | Status: DC | PRN

## 2022-07-29 MED ORDER — HEPARIN (PORCINE) IN NACL 1000-0.9 UT/500ML-% IV SOLN
INTRAVENOUS | Status: AC
  Filled 2022-07-29: qty 1000

## 2022-07-29 MED ORDER — LIDOCAINE HCL (PF) 1 % IJ SOLN
INTRAMUSCULAR | Status: DC | PRN
  Administered 2022-07-29: 2 mL

## 2022-07-29 MED ORDER — ACETAMINOPHEN 325 MG PO TABS: 650.0000 mg | ORAL_TABLET | ORAL | Status: DC | PRN

## 2022-07-29 MED ORDER — VERAPAMIL HCL 2.5 MG/ML IV SOLN
INTRAVENOUS | Status: DC | PRN
  Administered 2022-07-29: 2.5 mg via INTRA_ARTERIAL

## 2022-07-29 MED ORDER — FENTANYL CITRATE (PF) 100 MCG/2ML IJ SOLN
INTRAMUSCULAR | Status: AC
  Filled 2022-07-29: qty 2

## 2022-07-29 MED ORDER — VERAPAMIL HCL 2.5 MG/ML IV SOLN
INTRAVENOUS | Status: AC
  Filled 2022-07-29: qty 2

## 2022-07-29 MED ORDER — LIDOCAINE HCL 1 % IJ SOLN
INTRAMUSCULAR | Status: AC
  Filled 2022-07-29: qty 20

## 2022-07-29 SURGICAL SUPPLY — 13 items
BAND ZEPHYR COMPRESS 30 LONG (HEMOSTASIS) IMPLANT
CATH 5FR JL3.5 JR4 ANG PIG MP (CATHETERS) IMPLANT
COVER PRB 48X5XTLSCP FOLD TPE (BAG) IMPLANT
COVER PROBE 5X48 (BAG) ×1
DRAPE BRACHIAL (DRAPES) IMPLANT
GLIDESHEATH SLEND SS 6F .021 (SHEATH) IMPLANT
GUIDEWIRE INQWIRE 1.5J.035X260 (WIRE) IMPLANT
INQWIRE 1.5J .035X260CM (WIRE) ×1
KIT SYRINGE INJ CVI SPIKEX1 (MISCELLANEOUS) IMPLANT
PACK CARDIAC CATH (CUSTOM PROCEDURE TRAY) ×1 IMPLANT
PROTECTION STATION PRESSURIZED (MISCELLANEOUS) ×1
SET ATX SIMPLICITY (MISCELLANEOUS) IMPLANT
STATION PROTECTION PRESSURIZED (MISCELLANEOUS) IMPLANT

## 2022-07-29 NOTE — Progress Notes (Signed)
[facility] Progress Note  Patient: Sarah [name]    WIO:[id number]  DOA: 07/19/2022    Date of Service: the patient was seen and examined on 07/29/2022  Brief hospital course: 76 year old female with past medical history of Graves' disease status post radiation therapy, diabetes mellitus, hypertension and diverticulitis with abscess formation status post CT-guided drain placement a few months ago who presented on 8/22 for a scheduled laparoscopic sigmoidectomy.  Patient underwent procedure which was converted to open Hartman's procedure.  Postop, patient developed atrial fibrillation with RVR, hypotension and non-STEMI.  Placed in the ICU for hypotension on pressor support.  Cardiology consulted and patient started on amiodarone drip.  Also developed acute on chronic congestive heart failure with an echocardiogram noting ejection fraction of 45 to 50%.    By 8/25, patient able to be weaned off of pressor support.  Over next few days, patient had worsening respiratory status initially requiring BiPAP and then transition to heated high flow nasal cannula.  Palliative care consulted.  By 8/30, patient's oxygenation able to be weaned down and currently on 4 L nasal cannula.  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  Patient not a candidate for CABG given comorbidities.  Plan for now is to attempt medical therapy.  To date, patient has diuresed over 5.5 L of fluid.  General surgery removed JP drain.  Wound exposed and packed with wet-to-dry dressing.  PT and OT recommending skilled nursing once patient leaves the hospital.  Assessment and Plan: Assessment and Plan: * Diverticulitis Status post partial sigmoidectomy along with Hartman's pouch.  General surgery following.  Has removed JP drain and now with daily wound care, wet-to-dry.  As per  surgery, this can be done at skilled nursing.  Acute hypoxemic respiratory failure (HCC) Secondary to CHF and possibly HCAP.  Continues to do well and oxygen weaned, currently at 4 L nasal cannula.  NSTEMI (non-ST elevated myocardial infarction) [facility]) Status post cardiac catheterization with ejection fraction of 25% noted.  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  Not a candidate for CABG.  For now, medical management with plans for future intervention if possible.  Continue aspirin and Lipitor.  Septic shock (HCC)-resolved as of 07/28/2022 Secondary to intra-abdominal infection and UTI.  Weaned off of pressor support.  Completed full course of antibiotics.  Acute systolic CHF (congestive heart failure) ([place]) Responding well to diuretics, has diuresed -5 L deficient.  Almost 10 pounds less.  Follow renal function.  Looks to be leveling off.  Uncontrolled type 2 diabetes mellitus with hyperglycemia, without long-term current use of insulin (HCC) A1C several months ago at 8.3.  Currently on sliding scale only as well as steroids  Atrial fibrillation with RVR ([place]) Presented when patient developed sepsis.  Continue amiodarone.  Rate controlled.  Currently Lovenox at prophylaxis dose due to patient's open wounds.  Protein-calorie malnutrition, severe Nutrition Status: Nutrition Problem: Severe Malnutrition Etiology: acute illness Signs/Symptoms: percent weight loss, severe fat depletion, severe muscle depletion Percent weight loss: 21 %       Electrolyte abnormality including hyponatremia, hypokalemia, hypophosphatemia and hypomagnesemia Replacing as needed.  Pressure injury of skin Pressure Injury 07/21/22 Sacrum Bilateral;Mid Stage 2 -  Partial thickness loss of dermis presenting as a shallow open injury with a red, pink wound bed without slough. non-blanchable and blanchable redness with skin  tear (Active)  07/21/22 1400  Location: Sacrum  Location  Orientation: Bilateral;Mid  Staging: Stage 2 -  Partial thickness loss of dermis presenting as a shallow open injury with a red, pink wound bed without slough.  Wound Description (Comments): non-blanchable and blanchable redness with skin tear  Present on Admission: Yes (patient states she thinks she has had this wound from sitting in her recliner at home.)    Stage II sacral ulcer, present on admission  Overweight (BMI 25.0-29.9) Meets criteria BMI greater than 25       Body mass index is 26.13 kg/m.  Nutrition Problem: Severe Malnutrition Etiology: acute illness Pressure Injury 07/21/22 Sacrum Bilateral;Mid Stage 2 -  Partial thickness loss of dermis presenting as a shallow open injury with a red, pink wound bed without slough. non-blanchable and blanchable redness with skin tear (Active)  07/21/22 1400  Location: Sacrum  Location Orientation: Bilateral;Mid  Staging: Stage 2 -  Partial thickness loss of dermis presenting as a shallow open injury with a red, pink wound bed without slough.  Wound Description (Comments): non-blanchable and blanchable redness with skin tear  Present on Admission: Yes  Dressing Type Foam - Lift dressing to assess site every shift 07/28/22 2000     Consultants: Cardiology Surgery Critical care Wound care Diabetes coordinator  Procedures: Status post partial sigmoidectomy with conversion to Hartman's pouch Pressor support Cardiac catheterization  Antimicrobials: Antibiotics Given (last 72 hours)     None       Code Status: Full code   Subjective: Seen after cardiac catheterization.  Complains of having not much of an appetite.  Objective: Vital signs were reviewed and unremarkable. Vitals:   07/29/22 1200 07/29/22 1400  BP: 130/73 129/67  Pulse: 83 82  Resp: (!) 24 (!) 25  Temp: 97.9 F (36.6 C)   SpO2: 96% 100%    Intake/Output Summary (Last 24 hours) at 07/29/2022 1544 Last data filed at 07/29/2022 1300 Gross per 24 hour   Intake 363 ml  Output 2900 ml  Net -2537 ml    Filed Weights   07/26/22 1000 07/27/22 0500 07/28/22 0600  Weight: 70.3 kg 67.8 kg 66.9 kg   Body mass index is 26.13 kg/m.  Exam:  General: Alert and oriented x3, no acute distress HEENT: Normocephalic, atraumatic, mucous membranes slightly dry Cardiovascular: Irregular rhythm, rate controlled Respiratory: Decreased breath sounds bibasilar Abdomen: Abdominal wound packed Musculoskeletal: No clubbing or cyanosis, 1+ pitting edema bilaterally Skin: No skin breaks, tears or lesions other than abdominal wound as described above Psychiatry: Appropriate, no evidence of psychoses Neurology: No focal deficits  Data Reviewed: Noted white blood cell count of 11.2.  Stable hemoglobin.  Stable electrolytes and renal function.  Disposition:  Status is: Inpatient Remains inpatient appropriate because: Improvement in oxygenation, going to skilled nursing  Anticipated discharge date: 9/5  Family Communication: Left message for family DVT Prophylaxis: enoxaparin (LOVENOX) injection 40 mg Start: 07/26/22 2200 Place and maintain sequential compression device Start: 07/25/22 1048 SCD's Start: 07/19/22 2042    Author: Annita Brod ,MD 07/29/2022 3:44 PM  To reach On-call, see care teams to locate the attending and reach out via [email or web address]. Between 7PM-7AM, please contact night-coverage If you still have difficulty reaching the attending provider, please page the [facility] (Director on Call) for [facility] on amion for assistance.

## 2022-07-29 NOTE — Progress Notes (Addendum)
Physical Therapy Treatment Patient Details Name: Sarah [name] MRN: [id number] DOB: 11/09/1946 Today's Date: 07/29/2022   History of Present Illness Patient is a 76 year old female with history of hypertension, Graves' disease, type 2 diabetes mellitus, IBS, diverticulitis, who presented for scheduled laprascopic sigmoidectomy on 07/19/22. s/p robotic sigmoidectomy converted to open Hartman's procedure. Pathology results showing Invasive colorectal adenocarcinoma. She required ICU stay for hypotension and with perioperative NSTEMI.    PT Comments    Pt seen for PT tx with pt agreeable despite noting fatigue. Pt initially requesting to stay in bed then agreeable to OOB. PT educates pt on decreased use of RUE 2/2 heart cath this AM & pt demonstrates good ability to maintain this throughout mobility. Pt requires mod assist to upright trunk during supine>sit with HOB elevated & bed rails but requires multiple attempts with MAX assist +1 & ultimately max assist +2 for squat pivot bed>recliner on L. Pt with difficulty & inability to active glutes/hip extensors & to upright trunk & requires MAX assist +2 to safely complete transfer. Pt does engage in BLE strengthening exercises with cuing for technique. Continue to recommend STR to maximize independence with functional mobility & reduce fall risk prior to return home.  Pt on 4L/min via nasal cannula throughout session.     Recommendations for follow up therapy are one component of a multi-disciplinary discharge planning process, led by the attending physician.  Recommendations may be updated based on patient status, additional functional criteria and insurance authorization.  Follow Up Recommendations  Skilled nursing-short term rehab (<3 hours/day) Can patient physically be transported by private vehicle: No   Assistance Recommended at Discharge Frequent or constant Supervision/Assistance  Patient can return home with the following Two people to help with  walking and/or transfers;A lot of help with bathing/dressing/bathroom;Help with stairs or ramp for entrance;Assist for transportation;Assistance with cooking/housework   Equipment Recommendations   (TBD at next venue)    Recommendations for Other Services       Precautions / Restrictions Precautions Precautions: Fall Precaution Comments: R radial heart cath 9/1 Restrictions Weight Bearing Restrictions: No     Mobility  Bed Mobility Overal bed mobility: Needs Assistance Bed Mobility: Supine to Sit     Supine to sit: Mod assist, HOB elevated     General bed mobility comments: Pt with good awareness to limit RUE use. Pt requires mod assist to upright trunk with HOB elevated & bed rails.    Transfers Overall transfer level: Needs assistance Equipment used: 2 person hand held assist Transfers: Bed to chair/wheelchair/BSC       Squat pivot transfers: +2 physical assistance, Max assist          Ambulation/Gait                   Stairs             Wheelchair Mobility    Modified Rankin (Stroke Patients Only)       Balance Overall balance assessment: Needs assistance Sitting-balance support: Feet supported Sitting balance-Leahy Scale: Fair Sitting balance - Comments: supervision static sitting   Standing balance support: Bilateral upper extremity supported, During functional activity Standing balance-Leahy Scale: Zero                              Cognition Arousal/Alertness: Awake/alert Behavior During Therapy: WFL for tasks assessed/performed Overall Cognitive Status: Impaired/Different from baseline Area of Impairment: Problem solving, Safety/judgement  Safety/Judgement: Decreased awareness of safety, Decreased awareness of deficits              Exercises General Exercises - Lower Extremity Long Arc Quad: AROM, Strengthening, Both, 10 reps, Seated Hip Flexion/Marching: AROM,  Strengthening, Both, 10 reps, Seated    General Comments        Pertinent Vitals/Pain Pain Assessment Pain Assessment: No/denies pain    Home Living                          Prior Function            PT Goals (current goals can now be found in the care plan section) Acute Rehab PT Goals Patient Stated Goal: to get back home PT Goal Formulation: With patient Time For Goal Achievement: 08/11/22 Potential to Achieve Goals: Fair Progress towards PT goals: Progressing toward goals    Frequency    Min 2X/week      PT Plan Current plan remains appropriate    Co-evaluation              AM-PAC PT "6 Clicks" Mobility   Outcome Measure  Help needed turning from your back to your side while in a flat bed without using bedrails?: A Lot Help needed moving from lying on your back to sitting on the side of a flat bed without using bedrails?: A Lot Help needed moving to and from a bed to a chair (including a wheelchair)?: Total Help needed standing up from a chair using your arms (e.g., wheelchair or bedside chair)?: Total Help needed to walk in hospital room?: Total Help needed climbing 3-5 steps with a railing? : Total 6 Click Score: 8    End of Session Equipment Utilized During Treatment: Oxygen;Gait belt Activity Tolerance: Patient tolerated treatment well;Patient limited by fatigue Patient left: in chair;with call bell/phone within reach Nurse Communication: Mobility status PT Visit Diagnosis: Unsteadiness on feet (R26.81);Muscle weakness (generalized) (M62.81);Difficulty in walking, not elsewhere classified (R26.2)     Time: 2094-7096 PT Time Calculation (min) (ACUTE ONLY): 13 min  Charges:  $Therapeutic Activity: 8-22 mins                     Lavone Nian, PT, DPT 07/29/22, 3:55 PM   Waunita Schooner 07/29/2022, 3:53 PM

## 2022-07-29 NOTE — CV Procedure (Signed)
Brief cath note Patient was brought to the cardiac Cath Lab because of a non-STEMI Patient has had heart failure as well and hypotension and tenuous course but improving slightly Patient still hypoxic requiring oxygen  Patient brought to the cardiac Cath Lab for left heart cath right radial approach Left ventricular function EF around 25% with global hypokinesis inferior apical but globally depressed  Coronaries Left main minor 25% ostial LAD was large with 75% mid lesion minor irregularities throughout Circumflex was large 100% mid IRA TIMI 0 flow RCA large minor irregularities right dominant  Intervention was deferred Would recommend medical therapy in the interim Consider revascularization percutaneously staged for mid LAD versus occluded mid circumflex  Continue heart failure therapy Continue respiratory therapy Supplemental oxygen as necessary  We will have the patient follow-up as an outpatient And decide further evaluation medically versus interventionally Patient is probably not coronary bypass candidate because of her comorbid condition

## 2022-07-29 NOTE — Progress Notes (Signed)
Pt drinking diet sprite, brother danny updated, Dr Clayborn Bigness not available, family returning home

## 2022-07-29 NOTE — TOC Progression Note (Addendum)
Transition of Care [facility]) - Progression Note    Patient Details  Name: Sarah [name] MRN: [id number] Date of Birth: 06/22/1946  Transition of [facility]) CM/SW [place], LCSW Phone Number: 07/29/2022, 1:56 PM  Clinical Narrative:    Patient has a bed offer at [facility] (her preferred SNF per notes). TOC will need to start insurance auth when appropriate, asked MD.  3:43- Per MD, patient should be medically ready at the beginning of next week.  Updated TOC handoff.   Expected Discharge Plan: [place] Barriers to Discharge: Continued Medical Work up  Expected Discharge Plan and Services Expected Discharge Plan: [place]   Discharge Planning Services: CM Consult Post Acute Care Choice: [place] Living arrangements for the past 2 months: Single Family Home                 DME Arranged: N/A                     Social Determinants of Health (SDOH) Interventions    Readmission Risk Interventions    07/28/2022    3:27 PM  Readmission Risk Prevention Plan  Transportation Screening Complete  PCP or Specialist Appt within 3-5 Days Complete  HRI or [place] Complete  Social Work Consult for [place] Planning/Counseling Complete  Palliative Care Screening Complete  Medication Review Press photographer) Complete

## 2022-07-30 LAB — GLUCOSE, CAPILLARY
Glucose-Capillary: 170 mg/dL — ABNORMAL HIGH (ref 70–99)
Glucose-Capillary: 372 mg/dL — ABNORMAL HIGH (ref 70–99)
Glucose-Capillary: 181 mg/dL — ABNORMAL HIGH (ref 70–99)
Glucose-Capillary: 172 mg/dL — ABNORMAL HIGH (ref 70–99)
Glucose-Capillary: 171 mg/dL — ABNORMAL HIGH (ref 70–99)
Glucose-Capillary: 316 mg/dL — ABNORMAL HIGH (ref 70–99)

## 2022-07-30 LAB — BASIC METABOLIC PANEL
Anion gap: 8 (ref 5–15)
BUN: 35 mg/dL — ABNORMAL HIGH (ref 8–23)
CO2: 33 mmol/L — ABNORMAL HIGH (ref 22–32)
Calcium: 7.7 mg/dL — ABNORMAL LOW (ref 8.9–10.3)
Chloride: 100 mmol/L (ref 98–111)
Creatinine, Ser: 0.56 mg/dL (ref 0.44–1.00)
GFR, Estimated: >60 mL/min (ref >60)
Glucose, Bld: 185 mg/dL — ABNORMAL HIGH (ref 70–99)
Potassium: 3.5 mmol/L (ref 3.5–5.1)
Sodium: 141 mmol/L (ref 135–145)

## 2022-07-30 LAB — CBC
HCT: 31.7 % — ABNORMAL LOW (ref 36.0–46.0)
Hemoglobin: 9.5 g/dL — ABNORMAL LOW (ref 12.0–15.0)
MCH: 27.7 pg (ref 26.0–34.0)
MCHC: 30 g/dL (ref 30.0–36.0)
MCV: 92.4 fL (ref 80.0–100.0)
Platelets: 294 10*3/uL (ref 150–400)
RBC: 3.43 MIL/uL — ABNORMAL LOW (ref 3.87–5.11)
RDW: 18.8 % — ABNORMAL HIGH (ref 11.5–15.5)
WBC: 9.7 10*3/uL (ref 4.0–10.5)
nRBC: 0 % (ref 0.0–0.2)

## 2022-07-30 LAB — MAGNESIUM: Magnesium: 1.8 mg/dL (ref 1.7–2.4)

## 2022-07-30 LAB — PHOSPHORUS: Phosphorus: 3 mg/dL (ref 2.5–4.6)

## 2022-07-30 MED ORDER — METOPROLOL TARTRATE 25 MG PO TABS
25.0000 mg | ORAL_TABLET | Freq: Three times a day (TID) | ORAL | Status: DC
  Administered 2022-07-30 – 2022-08-01 (×6): 25 mg via ORAL
  Filled 2022-07-30 (×6): qty 1

## 2022-07-30 MED ORDER — MAGNESIUM SULFATE 2 GM/50ML IV SOLN
2.0000 g | Freq: Once | INTRAVENOUS | Status: AC
  Administered 2022-07-30: 2 g via INTRAVENOUS
  Filled 2022-07-30: qty 50

## 2022-07-30 NOTE — Progress Notes (Signed)
[facility] Cardiology    SUBJECTIVE: Patient states to feel much better and breathing much improved no chest pain strength is improving improved appetite.  No postcardiac cath complication issues wrist feels   Vitals:   07/30/22 0800 07/30/22 0900 07/30/22 0952 07/30/22 1000  BP: 133/69 124/82  (!) 118/98  Pulse: 73 77 74   Resp: 16 18 (!) 21 (!) 23  Temp:      TempSrc:      SpO2: 100% 98% 100% 99%  Weight:      Height:         Intake/Output Summary (Last 24 hours) at 07/30/2022 1117 Last data filed at 07/30/2022 1018 Gross per 24 hour  Intake 492.23 ml  Output 1900 ml  Net -1407.77 ml      PHYSICAL EXAM  General: Well developed, well nourished, in no acute distress HEENT:  Normocephalic and atramatic Neck:  No JVD.  Lungs: Clear bilaterally to auscultation and percussion. Heart: Irregularly irregular. Normal S1 and S2 without gallops or murmurs.  Abdomen: Bowel sounds are positive, abdomen soft and non-tender  Msk:  Back normal, normal gait. Normal strength and tone for age. Extremities: No clubbing, cyanosis or edema.   Neuro: Alert and oriented X 3. Psych:  Good affect, responds appropriately   LABS: Basic Metabolic Panel: Recent Labs    07/29/22 0424 07/29/22 1347 07/30/22 0438  NA  --  140 141  K  --  3.7 3.5  CL  --  102 100  CO2  --  31 33*  GLUCOSE  --  221* 185*  BUN  --  35* 35*  CREATININE  --  0.48 0.56  CALCIUM  --  7.9* 7.7*  MG 1.8  --  1.8  PHOS 2.3*  --  3.0   Liver Function Tests: Recent Labs    07/28/22 0514  AST 62*  ALT 36  ALKPHOS 72  BILITOT 0.5  PROT 5.3*  ALBUMIN 2.3*   No results for input(s): "LIPASE", "AMYLASE" in the last 72 hours. CBC: Recent Labs    07/29/22 1347 07/30/22 0438  WBC 11.2* 9.7  HGB 10.4* 9.5*  HCT 33.1* 31.7*  MCV 90.4 92.4  PLT 337 294   Cardiac Enzymes: No results for input(s): "CKTOTAL", "CKMB", "CKMBINDEX", "TROPONINI" in the last 72 hours. BNP: Invalid input(s): "POCBNP" D-Dimer: No  results for input(s): "DDIMER" in the last 72 hours. Hemoglobin A1C: No results for input(s): "HGBA1C" in the last 72 hours. Fasting Lipid Panel: Recent Labs    07/28/22 0514  TRIG 82   Thyroid Function Tests: No results for input(s): "TSH", "T4TOTAL", "T3FREE", "THYROIDAB" in the last 72 hours.  Invalid input(s): "FREET3" Anemia Panel: No results for input(s): "VITAMINB12", "FOLATE", "FERRITIN", "TIBC", "IRON", "RETICCTPCT" in the last 72 hours.  No results found.   Echo severely depressed left ventricular function EF around 25%  TELEMETRY: Atrial fibrillation rate of about 110 nonspecific CT changes:  ASSESSMENT AND PLAN:  Principal Problem:   Diverticulitis Active Problems:   Protein-calorie malnutrition, severe   Pressure injury of skin   NSTEMI (non-ST elevated myocardial infarction) (HCC)   Acute systolic CHF (congestive heart failure) (HCC)   Atrial fibrillation with RVR (HCC)   Electrolyte abnormality including hyponatremia, hypokalemia, hypophosphatemia and hypomagnesemia   Uncontrolled type 2 diabetes mellitus with hyperglycemia, without long-term current use of insulin (HCC)   Acute hypoxemic respiratory failure (HCC)   Overweight (BMI 25.0-29.9)    Plan Status post cardiac cath occluded mid circumflex recommend medical therapy  Severe cardiomyopathy EF around 25% LVE Agree with heart failure management ACE/Laisx/Metoprolol Continue diabetes management control Status post non-STEMI occluded circumflex now with ischemic cardiomyopathy patient also has severe disease in mid LAD 75% lesion Agree with diabetes management and control Increase activity physical therapy For cardiac intervention until patient medically improved and stable Continue to follow-up for anemia limit anticoagulants Recommend low-dose aspirin possibly Plavix for 3 months Atrial fibrillation paroxysmal rate reasonably controlled at around 100 -110 we will consider Eliquis 2.5 twice a day as  long as bleeding is controlled  Yolonda Kida, MD 07/30/2022 11:17 AM

## 2022-07-30 NOTE — Progress Notes (Signed)
[facility] Progress Note  Patient: Sarah [name]    VCB:[id number]  DOA: 07/19/2022    Date of Service: the patient was seen and examined on 07/30/2022  Brief hospital course: 76 year old female with past medical history of Graves' disease status post radiation therapy, diabetes mellitus, hypertension and diverticulitis with abscess formation status post CT-guided drain placement a few months ago who presented on 8/22 for a scheduled laparoscopic sigmoidectomy.  Patient underwent procedure which was converted to open Hartman's procedure.  Postop, patient developed atrial fibrillation with RVR, hypotension and non-STEMI.  Placed in the ICU for hypotension on pressor support.  Cardiology consulted and patient started on amiodarone drip.  Also developed acute on chronic congestive heart failure with an echocardiogram noting ejection fraction of 45 to 50%.    By 8/25, patient able to be weaned off of pressor support.  Over next few days, patient had worsening respiratory status initially requiring BiPAP and then transition to heated high flow nasal cannula.  Palliative care consulted.  By 8/30, patient's oxygenation able to be weaned down and currently on 4 L nasal cannula.  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  Patient not a candidate for CABG given comorbidities.  Plan for now is to attempt medical therapy.  To date, patient has diuresed over 5.5 L of fluid.  General surgery removed JP drain.  Wound exposed and packed with wet-to-dry dressing.  PT and OT recommending skilled nursing once patient leaves the hospital.  Assessment and Plan: Assessment and Plan: * Diverticulitis Status post partial sigmoidectomy along with Hartman's pouch.  General surgery following.  Has removed JP drain and now with daily wound care, wet-to-dry.  As per  surgery, this can be done at skilled nursing.  Acute hypoxemic respiratory failure (HCC) Secondary to CHF and possibly HCAP.  Continues to do well and oxygen weaned, currently at 2 L.  NSTEMI (non-ST elevated myocardial infarction) [facility]) Status post cardiac catheterization with ejection fraction of 25% noted.  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  Not a candidate for CABG.  For now, medical management with plans for future intervention if possible.  Continue aspirin and Lipitor.  Septic shock (HCC)-resolved as of 07/28/2022 Secondary to intra-abdominal infection and UTI.  Weaned off of pressor support.  Completed full course of antibiotics.  Acute systolic CHF (congestive heart failure) ([place]) Responding well to diuretics, has diuresed -5 L deficient.  15 pounds less.  Follow renal function.  Looks to be leveling off.  Uncontrolled type 2 diabetes mellitus with hyperglycemia, without long-term current use of insulin (HCC) A1C several months ago at 8.3.  Currently on sliding scale only as well as steroids  Atrial fibrillation with RVR ([place]) Presented when patient developed sepsis.  Continue amiodarone.  Rate controlled.  Currently Lovenox at prophylaxis dose due to patient's open wounds.  Protein-calorie malnutrition, severe Nutrition Status: Nutrition Problem: Severe Malnutrition Etiology: acute illness Signs/Symptoms: percent weight loss, severe fat depletion, severe muscle depletion Percent weight loss: 21 %       Electrolyte abnormality including hyponatremia, hypokalemia, hypophosphatemia and hypomagnesemia Replacing as needed.  Pressure injury of skin Pressure Injury 07/21/22 Sacrum Bilateral;Mid Stage 2 -  Partial thickness loss of dermis presenting as a shallow open injury with a red, pink wound bed without slough. non-blanchable and blanchable redness with skin tear (Active)  07/21/22 1400  Location: Sacrum  Location Orientation: Bilateral;Mid   Staging: Stage 2 -  Partial thickness loss of dermis presenting as a shallow open injury with a red, pink wound bed without slough.  Wound Description (Comments): non-blanchable and blanchable redness with skin tear  Present on Admission: Yes (patient states she thinks she has had this wound from sitting in her recliner at home.)    Stage II sacral ulcer, present on admission  Overweight (BMI 25.0-29.9) Meets criteria BMI greater than 25       Body mass index is 24.61 kg/m.  Nutrition Problem: Severe Malnutrition Etiology: acute illness Pressure Injury 07/21/22 Sacrum Bilateral;Mid Stage 2 -  Partial thickness loss of dermis presenting as a shallow open injury with a red, pink wound bed without slough. non-blanchable and blanchable redness with skin tear (Active)  07/21/22 1400  Location: Sacrum  Location Orientation: Bilateral;Mid  Staging: Stage 2 -  Partial thickness loss of dermis presenting as a shallow open injury with a red, pink wound bed without slough.  Wound Description (Comments): non-blanchable and blanchable redness with skin tear  Present on Admission: Yes  Dressing Type Foam - Lift dressing to assess site every shift 07/29/22 1930     Consultants: Cardiology Surgery Critical care Wound care Diabetes coordinator  Procedures: Status post partial sigmoidectomy with conversion to Hartman's pouch Pressor support Cardiac catheterization  Antimicrobials: Antibiotics Given (last 72 hours)     None       Code Status: Full code   Subjective: Doing okay, no complaints.  Objective: Vital signs were reviewed and unremarkable. Vitals:   07/30/22 1200 07/30/22 1300  BP: (!) 117/55 113/61  Pulse: 67 71  Resp: 10 19  Temp:    SpO2: 95% 96%    Intake/Output Summary (Last 24 hours) at 07/30/2022 1413 Last data filed at 07/30/2022 1328 Gross per 24 hour  Intake 683.41 ml  Output 2075 ml  Net -1391.59 ml    Filed Weights   07/27/22 0500 07/28/22 0600  07/30/22 0500  Weight: 67.8 kg 66.9 kg 63 kg   Body mass index is 24.61 kg/m.  Exam:  General: Alert and oriented x3, no acute distress HEENT: Normocephalic, atraumatic, mucous membranes slightly dry Cardiovascular: Irregular rhythm, rate controlled Respiratory: Decreased breath sounds bibasilar Abdomen: Abdominal wound packed Musculoskeletal: No clubbing or cyanosis, 1+ pitting edema bilaterally Skin: No skin breaks, tears or lesions other than abdominal wound as described above Psychiatry: Appropriate, no evidence of psychoses Neurology: No focal deficits  Data Reviewed: Hemoglobin dropped slightly to 9.5.  Stable renal function.  Disposition:  Status is: Inpatient Remains inpatient appropriate because: Improvement in oxygenation, going to skilled nursing  Anticipated discharge date: 9/5  Family Communication: Left message for family DVT Prophylaxis: enoxaparin (LOVENOX) injection 40 mg Start: 07/26/22 2200 Place and maintain sequential compression device Start: 07/25/22 1048 SCD's Start: 07/19/22 2042    Author: Annita Brod ,MD 07/30/2022 2:13 PM  To reach On-call, see care teams to locate the attending and reach out via [email or web address]. Between 7PM-7AM, please contact night-coverage If you still have difficulty reaching the attending provider, please page the [facility] (Director on Call) for [facility] on amion for assistance.

## 2022-07-30 NOTE — Progress Notes (Signed)
Patient transported via bed to 2A, room 260 on telemetry accompanied by nurse tech.

## 2022-07-30 NOTE — Progress Notes (Signed)
Report called to Rod, nurse receiving patient into room 260

## 2022-07-30 NOTE — Progress Notes (Signed)
PHARMACY CONSULT NOTE  Pharmacy Consult for Electrolyte Monitoring and Replacement   Recent Labs: Potassium (mmol/L)  Date Value  07/30/2022 3.5  01/12/2013 3.8   Magnesium (mg/dL)  Date Value  07/30/2022 1.8   Calcium (mg/dL)  Date Value  07/30/2022 7.7 (L)   Calcium, Total (mg/dL)  Date Value  01/12/2013 9.6   Albumin (g/dL)  Date Value  07/28/2022 2.3 (L)   Phosphorus (mg/dL)  Date Value  07/30/2022 3.0   Sodium (mmol/L)  Date Value  07/30/2022 141  01/12/2013 139     Assessment:   76 y/o female with a PMH of hypertension, Graves' disease s/p XRT, hypothyroidism, type 2 DM, IBS, HTN, HLD and diverticulitis with abscess formation s/p IR drain 7/18, now s/p Hartmann's procedure 4/31 complicated by sepsis, AKI, NSTEMI and new Afib. Pharmacy is asked to follow and replace electrolytes while in CCU  Diuretics furosemide 40 mg IV q12h, spironolactone 12.5 mg po daily  Goal of Therapy:  Potassium 4.0 - 5.1 mmol/L Magnesium 2.0 - 2.4 mg/dL All Other Electrolytes WNL  Plan:  2 grams IV magnesium sulfate x 1 Recheck electrolytes in am  Dallie Piles ,PharmD Clinical Pharmacist 07/30/2022 6:23 AM

## 2022-07-31 LAB — CBC
HCT: 35.8 % — ABNORMAL LOW (ref 36.0–46.0)
Hemoglobin: 11 g/dL — ABNORMAL LOW (ref 12.0–15.0)
MCH: 28.3 pg (ref 26.0–34.0)
MCHC: 30.7 g/dL (ref 30.0–36.0)
MCV: 92 fL (ref 80.0–100.0)
Platelets: 311 10*3/uL (ref 150–400)
RBC: 3.89 MIL/uL (ref 3.87–5.11)
RDW: 19.4 % — ABNORMAL HIGH (ref 11.5–15.5)
WBC: 12.3 10*3/uL — ABNORMAL HIGH (ref 4.0–10.5)
nRBC: 0 % (ref 0.0–0.2)

## 2022-07-31 LAB — GLUCOSE, CAPILLARY
Glucose-Capillary: 146 mg/dL — ABNORMAL HIGH (ref 70–99)
Glucose-Capillary: 206 mg/dL — ABNORMAL HIGH (ref 70–99)
Glucose-Capillary: 263 mg/dL — ABNORMAL HIGH (ref 70–99)
Glucose-Capillary: 181 mg/dL — ABNORMAL HIGH (ref 70–99)
Glucose-Capillary: 169 mg/dL — ABNORMAL HIGH (ref 70–99)
Glucose-Capillary: 171 mg/dL — ABNORMAL HIGH (ref 70–99)

## 2022-07-31 LAB — PHOSPHORUS: Phosphorus: 2.9 mg/dL (ref 2.5–4.6)

## 2022-07-31 LAB — BASIC METABOLIC PANEL
Anion gap: 7 (ref 5–15)
BUN: 34 mg/dL — ABNORMAL HIGH (ref 8–23)
CO2: 34 mmol/L — ABNORMAL HIGH (ref 22–32)
Calcium: 7.8 mg/dL — ABNORMAL LOW (ref 8.9–10.3)
Chloride: 97 mmol/L — ABNORMAL LOW (ref 98–111)
Creatinine, Ser: 0.54 mg/dL (ref 0.44–1.00)
GFR, Estimated: >60 mL/min (ref >60)
Glucose, Bld: 275 mg/dL — ABNORMAL HIGH (ref 70–99)
Potassium: 4.1 mmol/L (ref 3.5–5.1)
Sodium: 138 mmol/L (ref 135–145)

## 2022-07-31 LAB — MAGNESIUM: Magnesium: 2 mg/dL (ref 1.7–2.4)

## 2022-07-31 LAB — LIPOPROTEIN A (LPA): Lipoprotein (a): 135 nmol/L — ABNORMAL HIGH (ref <75.0)

## 2022-07-31 MED ORDER — METHYLPREDNISOLONE SODIUM SUCC 125 MG IJ SOLR
INTRAMUSCULAR | Status: AC
  Filled 2022-07-31: qty 2

## 2022-07-31 MED ORDER — METHYLPREDNISOLONE SODIUM SUCC 40 MG IJ SOLR
INTRAMUSCULAR | Status: AC
  Filled 2022-07-31: qty 1

## 2022-07-31 MED ORDER — METHYLPREDNISOLONE SODIUM SUCC 40 MG IJ SOLR
40.0000 mg | INTRAMUSCULAR | Status: DC
Start: 2022-08-01 — End: 2022-08-01

## 2022-07-31 NOTE — Progress Notes (Signed)
Subjective:  CC: Sarah [name] is a 76 y.o. female  Hospital stay day 12, 2 Days Post-Op robotic sigmoidectomy converted to open Hartman's procedure  HPI: No acute issues overnight.  ROS:  General: Denies weight loss, weight gain, fatigue, fevers, chills, and night sweats. Heart: Denies chest pain, palpitations, racing heart, irregular heartbeat, leg pain or swelling, and decreased activity tolerance. Respiratory: Denies cough, and sputum. GI: Denies change in appetite, heartburn, nausea, vomiting, constipation, diarrhea, and blood in stool. GU: Denies difficulty urinating, pain with urinating, urgency, frequency, blood in urine.   Objective:   Temp:  [97.4 F (36.3 C)-98.2 F (36.8 C)] 97.5 F (36.4 C) (09/03 0757) Pulse Rate:  [64-73] 69 (09/03 0907) Resp:  [10-19] 16 (09/03 0907) BP: (108-124)/(50-64) 124/60 (09/03 0757) SpO2:  [93 %-99 %] 95 % (09/03 0907) Weight:  [66.5 kg-67.6 kg] 67.6 kg (09/03 0524)     Height: '5\' 3"'$  (160 cm) Weight: 67.6 kg BMI (Calculated): 26.41   Intake/Output this shift:   Intake/Output Summary (Last 24 hours) at 07/31/2022 1040 Last data filed at 07/31/2022 8270 Gross per 24 hour  Intake 560.18 ml  Output 2575 ml  Net -2014.82 ml    Constitutional :  alert, cooperative, appears stated age, and no distress  Respiratory:  clear to auscultation bilaterally  Cardiovascular:  Tachycardic  Gastrointestinal: Soft, no guarding, no TTP . Ostomy pink, patent, stool in bag  Skin: Cool and moist. Port site incisions healed.  Midline now with recurrent purulent drainage  Psychiatric: Normal affect, non-agitated, not confused       LABS:     Latest Ref Rng & Units 07/31/2022    9:10 AM 07/30/2022    4:38 AM 07/29/2022    1:47 PM  CMP  Glucose 70 - 99 mg/dL 275  185  221   BUN 8 - 23 mg/dL 34  35  35   Creatinine 0.44 - 1.00 mg/dL 0.54  0.56  0.48   Sodium 135 - 145 mmol/L 138  141  140   Potassium 3.5 - 5.1 mmol/L 4.1  3.5  3.7   Chloride 98 - 111 mmol/L  97  100  102   CO2 22 - 32 mmol/L 34  33  31   Calcium 8.9 - 10.3 mg/dL 7.8  7.7  7.9       Latest Ref Rng & Units 07/31/2022    9:10 AM 07/30/2022    4:38 AM 07/29/2022    1:47 PM  CBC  WBC 4.0 - 10.5 K/uL 12.3  9.7  11.2   Hemoglobin 12.0 - 15.0 g/dL 11.0  9.5  10.4   Hematocrit 36.0 - 46.0 % 35.8  31.7  33.1   Platelets 150 - 400 K/uL 311  294  337     RADS: N/a Assessment:   S/p robotic sigmoidectomy converted to open Hartman's procedure for complicated diverticulitis.  Removed remaining staples today after looking at the wound since there was a small pocket of purulent drainage again.  No further evidence of infection after drainage of the purulent pocket, fascia still feels intact.  Continue daily wet-to-dry's  Continue further care per ICU, cardiology.  Surgery will continue to see intermittently while inhouse for wound check.  labs/images/medications/previous chart entries reviewed personally and relevant changes/updates noted above.

## 2022-07-31 NOTE — Progress Notes (Signed)
PHARMACY CONSULT NOTE  Pharmacy Consult for Electrolyte Monitoring and Replacement   Recent Labs: Potassium (mmol/L)  Date Value  07/31/2022 4.1  01/12/2013 3.8   Magnesium (mg/dL)  Date Value  07/31/2022 2.0   Calcium (mg/dL)  Date Value  07/31/2022 7.8 (L)   Calcium, Total (mg/dL)  Date Value  01/12/2013 9.6   Albumin (g/dL)  Date Value  07/28/2022 2.3 (L)   Phosphorus (mg/dL)  Date Value  07/31/2022 2.9   Sodium (mmol/L)  Date Value  07/31/2022 138  01/12/2013 139     Assessment:   76 y/o female with a PMH of hypertension, Graves' disease s/p XRT, hypothyroidism, type 2 DM, IBS, HTN, HLD and diverticulitis with abscess formation s/p IR drain 7/18, now s/p Hartmann's procedure 8/88 complicated by sepsis, AKI, NSTEMI and new Afib. Pharmacy is asked to follow and replace electrolytes while in CCU  Diuretics furosemide 40 mg IV q12h, spironolactone 12.5 mg po daily  Goal of Therapy:  Potassium 4.0 - 5.1 mmol/L Magnesium 2.0 - 2.4 mg/dL All Other Electrolytes WNL  Plan:  No electrolyte replacement at this time F/u electrolytes in am  Noralee Space ,PharmD Clinical Pharmacist 07/31/2022 9:34 AM

## 2022-07-31 NOTE — Progress Notes (Signed)
[facility] Progress Note  Patient: Sarah [name]    UYQ:[id number]  DOA: 07/19/2022    Date of Service: the patient was seen and examined on 07/31/2022  Brief hospital course: 76 year old female with past medical history of Graves' disease status post radiation therapy, diabetes mellitus, hypertension and diverticulitis with abscess formation status post CT-guided drain placement a few months ago who presented on 8/22 for a scheduled laparoscopic sigmoidectomy.  Patient underwent procedure which was converted to open Hartman's procedure.  Postop, patient developed atrial fibrillation with RVR, hypotension and non-STEMI.  Placed in the ICU for hypotension on pressor support.  Cardiology consulted and patient started on amiodarone drip.  Also developed acute on chronic congestive heart failure with an echocardiogram noting ejection fraction of 45 to 50%.    By 8/25, patient able to be weaned off of pressor support.  Over next few days, patient had worsening respiratory status initially requiring BiPAP and then transition to heated high flow nasal cannula.  Palliative care consulted.  By 8/30, patient's oxygenation able to be weaned down and currently on 4 L nasal cannula.  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  Patient not a candidate for CABG given comorbidities.  Plan for now is to attempt medical therapy.  To date, patient has diuresed over 5.5 L of fluid.  General surgery removed JP drain.  Wound exposed and packed with wet-to-dry dressing.  PT and OT recommending skilled nursing once patient leaves the hospital.  Assessment and Plan: Assessment and Plan: * Diverticulitis Status post partial sigmoidectomy along with Hartman's pouch.  General surgery following.  Has removed JP drain and now with daily wound care, wet-to-dry.  Surgery  followed up cleaned out small pocket of fluid.  Continue daily wet-to-dry dressing changes which can be done at skilled nursing  Acute hypoxemic respiratory failure ([place]) Secondary to CHF and possibly HCAP.  Weaned off of oxygen, on room air  NSTEMI (non-ST elevated myocardial infarction) ([place]) Status post cardiac catheterization with ejection fraction of 25% noted.  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  Not a candidate for CABG.  For now, medical management with plans for future intervention if possible.  Continue aspirin and Lipitor.  Septic shock (HCC)-resolved as of 07/28/2022 Secondary to intra-abdominal infection and UTI.  Weaned off of pressor support.  Completed full course of antibiotics.  Acute systolic CHF (congestive heart failure) ([place]) Responding well to diuretics, has diuresed over 9 L of fluid and is -8.5 L deficient 15 pounds less.  Follow renal function.  Looks to be leveling off.  Uncontrolled type 2 diabetes mellitus with hyperglycemia, without long-term current use of insulin (HCC) A1C several months ago at 8.3.  Currently on sliding scale only as well as steroids  Atrial fibrillation with RVR ([place]) Presented when patient developed sepsis.  Continue amiodarone.  Rate controlled.  Currently Lovenox at prophylaxis dose due to patient's open wounds.  Protein-calorie malnutrition, severe Nutrition Status: Nutrition Problem: Severe Malnutrition Etiology: acute illness Signs/Symptoms: percent weight loss, severe fat depletion, severe muscle depletion Percent weight loss: 21 %       Electrolyte abnormality including hyponatremia, hypokalemia, hypophosphatemia and hypomagnesemia Replacing as needed.  Pressure injury of skin Pressure Injury 07/21/22 Sacrum Bilateral;Mid Stage 2 -  Partial thickness loss of dermis presenting as a shallow open injury with a red,  pink wound bed without slough. non-blanchable and blanchable redness with skin  tear (Active)  07/21/22 1400  Location: Sacrum  Location Orientation: Bilateral;Mid  Staging: Stage 2 -  Partial thickness loss of dermis presenting as a shallow open injury with a red, pink wound bed without slough.  Wound Description (Comments): non-blanchable and blanchable redness with skin tear  Present on Admission: Yes (patient states she thinks she has had this wound from sitting in her recliner at home.)    Stage II sacral ulcer, present on admission  Overweight (BMI 25.0-29.9) Meets criteria BMI greater than 25       Body mass index is 26.4 kg/m.  Nutrition Problem: Severe Malnutrition Etiology: acute illness Pressure Injury 07/21/22 Sacrum Bilateral;Mid Stage 2 -  Partial thickness loss of dermis presenting as a shallow open injury with a red, pink wound bed without slough. non-blanchable and blanchable redness with skin tear (Active)  07/21/22 1400  Location: Sacrum  Location Orientation: Bilateral;Mid  Staging: Stage 2 -  Partial thickness loss of dermis presenting as a shallow open injury with a red, pink wound bed without slough.  Wound Description (Comments): non-blanchable and blanchable redness with skin tear  Present on Admission: Yes  Dressing Type Foam - Lift dressing to assess site every shift 07/30/22 2000     Consultants: Cardiology Surgery Critical care Wound care Diabetes coordinator  Procedures: Status post partial sigmoidectomy with conversion to Hartman's pouch Pressor support Cardiac catheterization  Antimicrobials: Antibiotics Given (last 72 hours)     None       Code Status: Full code   Subjective: Patient doing well, no complaints  Objective: Vital signs were reviewed and unremarkable. Vitals:   07/31/22 0907 07/31/22 1124  BP:  (!) 125/57  Pulse: 69 69  Resp: 16 17  Temp:  98.1 F (36.7 C)  SpO2: 95% 95%    Intake/Output Summary (Last 24 hours) at 07/31/2022 1416 Last data filed at 07/31/2022 1127 Gross per 24 hour   Intake 9 ml  Output 2650 ml  Net -2641 ml    Filed Weights   07/30/22 0500 07/30/22 1443 07/31/22 0524  Weight: 63 kg 66.5 kg 67.6 kg   Body mass index is 26.4 kg/m.  Exam:  General: Alert and oriented x3, no acute distress HEENT: Normocephalic, atraumatic, mucous membranes slightly dry Cardiovascular: Irregular rhythm, rate controlled Respiratory: Decreased breath sounds bibasilar Abdomen: Abdominal wound packed Musculoskeletal: No clubbing or cyanosis, 1+ pitting edema bilaterally Skin: No skin breaks, tears or lesions other than abdominal wound as described above Psychiatry: Appropriate, no evidence of psychoses Neurology: No focal deficits  Data Reviewed: Noted CBGs have been trending upward in the last 24 hours.  Disposition:  Status is: Inpatient Remains inpatient appropriate because: Improvement in oxygenation, going to skilled nursing  Anticipated discharge date: 9/5  Family Communication: Left message for family DVT Prophylaxis: enoxaparin (LOVENOX) injection 40 mg Start: 07/26/22 2200 Place and maintain sequential compression device Start: 07/25/22 1048 SCD's Start: 07/19/22 2042    Author: Annita Brod ,MD 07/31/2022 2:16 PM  To reach On-call, see care teams to locate the attending and reach out via [email or web address]. Between 7PM-7AM, please contact night-coverage If you still have difficulty reaching the attending provider, please page the [facility] (Director on Call) for [facility] on amion for assistance.

## 2022-07-31 NOTE — TOC Progression Note (Signed)
Transition of Care [facility]) - Progression Note    Patient Details  Name: Sarah [name] MRN: [id number] Date of Birth: 03-Sep-1946  Transition of [facility]) CM/SW [place], [place] Phone Number: 07/31/2022, 3:10 PM  Clinical Narrative:     Insurance auth started for [facility]  Approved Candace Cruise ID: [id number] From 9/4-9/6  Expected Discharge Plan: Skilled Nursing Facility Barriers to Discharge: Continued Medical Work up  Expected Discharge Plan and Services Expected Discharge Plan: [place]   Discharge Planning Services: CM Consult Post Acute Care Choice: [place] Living arrangements for the past 2 months: Single Family Home                 DME Arranged: N/A                     Social Determinants of Health (SDOH) Interventions    Readmission Risk Interventions    07/28/2022    3:27 PM  Readmission Risk Prevention Plan  Transportation Screening Complete  PCP or Specialist Appt within 3-5 Days Complete  HRI or [place] Complete  Social Work Consult for [place] Planning/Counseling Complete  Palliative Care Screening Complete  Medication Review [employer]) Complete

## 2022-07-31 NOTE — Progress Notes (Signed)
PHARMACIST - PHYSICIAN COMMUNICATION   CONCERNING: Methylprednisolone IV    Current order: Methylprednisolone IV '20mg'$  q 12 hrs     DESCRIPTION: Per [place] Protocol:   IV methylprednisolone will be converted to either a q12h or q24h frequency with the same total daily dose (TDD).  Ordered Dose: 1 to 125 mg TDD; convert to: TDD q24h.  Ordered Dose: 126 to 250 mg TDD; convert to: TDD div q12h.  Ordered Dose: >250 mg TDD; DAW.  Order has been adjusted to: Methylprednisolone IV '40mg'$  q 24 hrs  Aslan Montagna Rodriguez-[name] PharmD, BCPS 07/31/2022 5:47 PM

## 2022-07-31 NOTE — Progress Notes (Signed)
[facility]    SUBJECTIVE: Patient states to be doing reasonably well no shortness of breath no chest pain appetite improving resting comfortably feeling steadily better and better each day   Vitals:   07/31/22 0757 07/31/22 0907 07/31/22 1124 07/31/22 1608  BP: 124/60  (!) 125/57 (!) 117/54  Pulse: 66 69 69 67  Resp: '19 16 17 19  '$ Temp: (!) 97.5 F (36.4 C)  98.1 F (36.7 C) 98 F (36.7 C)  TempSrc:      SpO2: 96% 95% 95% 95%  Weight:      Height:         Intake/Output Summary (Last 24 hours) at 07/31/2022 1755 Last data filed at 07/31/2022 1127 Gross per 24 hour  Intake 9 ml  Output 2650 ml  Net -2641 ml      PHYSICAL EXAM  General: Well developed, well nourished, in no acute distress HEENT:  Normocephalic and atramatic Neck:  No JVD.  Lungs: Clear bilaterally to auscultation and percussion. Heart: HRRR . Normal S1 and S2 without gallops or murmurs.  Abdomen: Bowel sounds are positive, abdomen soft and non-tender  Msk:  Back normal, normal gait. Normal strength and tone for age. Extremities: No clubbing, cyanosis or edema.   Neuro: Alert and oriented X 3. Psych:  Good affect, responds appropriately   LABS: Basic Metabolic Panel: Recent Labs    07/30/22 0438 07/31/22 0910  NA 141 138  K 3.5 4.1  CL 100 97*  CO2 33* 34*  GLUCOSE 185* 275*  BUN 35* 34*  CREATININE 0.56 0.54  CALCIUM 7.7* 7.8*  MG 1.8 2.0  PHOS 3.0 2.9   Liver Function Tests: No results for input(s): "AST", "ALT", "ALKPHOS", "BILITOT", "PROT", "ALBUMIN" in the last 72 hours. No results for input(s): "LIPASE", "AMYLASE" in the last 72 hours. CBC: Recent Labs    07/30/22 0438 07/31/22 0910  WBC 9.7 12.3*  HGB 9.5* 11.0*  HCT 31.7* 35.8*  MCV 92.4 92.0  PLT 294 311   Cardiac Enzymes: No results for input(s): "CKTOTAL", "CKMB", "CKMBINDEX", "TROPONINI" in the last 72 hours. BNP: Invalid input(s): "POCBNP" D-Dimer: No results for input(s): "DDIMER" in the last 72  hours. Hemoglobin A1C: No results for input(s): "HGBA1C" in the last 72 hours. Fasting Lipid Panel: No results for input(s): "CHOL", "HDL", "LDLCALC", "TRIG", "CHOLHDL", "LDLDIRECT" in the last 72 hours. Thyroid Function Tests: No results for input(s): "TSH", "T4TOTAL", "T3FREE", "THYROIDAB" in the last 72 hours.  Invalid input(s): "FREET3" Anemia Panel: No results for input(s): "VITAMINB12", "FOLATE", "FERRITIN", "TIBC", "IRON", "RETICCTPCT" in the last 72 hours.  No results found.   Echo depressed left ventricular function ejection fraction around 25 to 30%  TELEMETRY: Paroxysmal atrial fibrillation rate of around 100 nonspecific ST-T wave changes:  ASSESSMENT AND PLAN:  Principal Problem:   Diverticulitis Active Problems:   Protein-calorie malnutrition, severe   Pressure injury of skin   NSTEMI (non-ST elevated myocardial infarction) (HCC)   Acute systolic CHF (congestive heart failure) (HCC)   Atrial fibrillation with RVR (HCC)   Electrolyte abnormality including hyponatremia, hypokalemia, hypophosphatemia and hypomagnesemia   Uncontrolled type 2 diabetes mellitus with hyperglycemia, without long-term current use of insulin (HCC)   Acute hypoxemic respiratory failure (HCC)   Overweight (BMI 25.0-29.9)    Plan Status post cardiac cath after non-STEMI found to have occluded mid circumflex IRA, 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

## 2022-08-01 DIAGNOSIS — E039 Hypothyroidism, unspecified: Secondary | ICD-10-CM | POA: Diagnosis present

## 2022-08-01 LAB — BRAIN NATRIURETIC PEPTIDE: B Natriuretic Peptide: 1388.1 pg/mL — ABNORMAL HIGH (ref 0.0–100.0)

## 2022-08-01 LAB — GLUCOSE, CAPILLARY
Glucose-Capillary: 221 mg/dL — ABNORMAL HIGH (ref 70–99)
Glucose-Capillary: 230 mg/dL — ABNORMAL HIGH (ref 70–99)
Glucose-Capillary: 267 mg/dL — ABNORMAL HIGH (ref 70–99)
Glucose-Capillary: 274 mg/dL — ABNORMAL HIGH (ref 70–99)

## 2022-08-01 LAB — MAGNESIUM: Magnesium: 1.9 mg/dL (ref 1.7–2.4)

## 2022-08-01 LAB — PHOSPHORUS: Phosphorus: 3 mg/dL (ref 2.5–4.6)

## 2022-08-01 LAB — BASIC METABOLIC PANEL
Anion gap: 12 (ref 5–15)
BUN: 41 mg/dL — ABNORMAL HIGH (ref 8–23)
CO2: 34 mmol/L — ABNORMAL HIGH (ref 22–32)
Calcium: 8.2 mg/dL — ABNORMAL LOW (ref 8.9–10.3)
Chloride: 92 mmol/L — ABNORMAL LOW (ref 98–111)
Creatinine, Ser: 0.53 mg/dL (ref 0.44–1.00)
GFR, Estimated: >60 mL/min (ref >60)
Glucose, Bld: 254 mg/dL — ABNORMAL HIGH (ref 70–99)
Potassium: 3.6 mmol/L (ref 3.5–5.1)
Sodium: 138 mmol/L (ref 135–145)

## 2022-08-01 LAB — CBC
HCT: 34.1 % — ABNORMAL LOW (ref 36.0–46.0)
Hemoglobin: 10.5 g/dL — ABNORMAL LOW (ref 12.0–15.0)
MCH: 27.9 pg (ref 26.0–34.0)
MCHC: 30.8 g/dL (ref 30.0–36.0)
MCV: 90.7 fL (ref 80.0–100.0)
Platelets: 267 10*3/uL (ref 150–400)
RBC: 3.76 MIL/uL — ABNORMAL LOW (ref 3.87–5.11)
RDW: 19 % — ABNORMAL HIGH (ref 11.5–15.5)
WBC: 10.2 10*3/uL (ref 4.0–10.5)
nRBC: 0 % (ref 0.0–0.2)

## 2022-08-01 MED ORDER — ACETAMINOPHEN 325 MG PO TABS: 650.0000 mg | ORAL_TABLET | ORAL | Status: DC | PRN

## 2022-08-01 MED ORDER — ASPIRIN 81 MG PO CHEW
81.0000 mg | CHEWABLE_TABLET | Freq: Every day | ORAL | 1 refills | Status: AC
Start: 2022-08-02 — End: ?

## 2022-08-01 MED ORDER — METOPROLOL TARTRATE 25 MG PO TABS
25.0000 mg | ORAL_TABLET | Freq: Three times a day (TID) | ORAL | 1 refills | Status: AC
Start: 2022-08-01 — End: ?

## 2022-08-01 MED ORDER — ENSURE ENLIVE PO LIQD: 237.0000 mL | Freq: Three times a day (TID) | ORAL | 12 refills | Status: AC

## 2022-08-01 MED ORDER — PREDNISONE 10 MG PO TABS
ORAL_TABLET | ORAL | 0 refills | Status: AC
Start: 2022-08-01 — End: 2022-08-05

## 2022-08-01 MED ORDER — FUROSEMIDE 40 MG PO TABS: ORAL_TABLET | ORAL | 0 refills | Status: DC

## 2022-08-01 MED ORDER — ADULT MULTIVITAMIN W/MINERALS CH: 1.0000 | ORAL_TABLET | Freq: Every day | ORAL | 1 refills | Status: AC

## 2022-08-01 MED ORDER — ZINC SULFATE 220 (50 ZN) MG PO CAPS: 220.0000 mg | ORAL_CAPSULE | Freq: Every day | ORAL | 1 refills | Status: AC

## 2022-08-01 MED ORDER — FUROSEMIDE 80 MG PO TABS: 80.0000 mg | ORAL_TABLET | Freq: Two times a day (BID) | ORAL | 11 refills | Status: DC

## 2022-08-01 MED ORDER — PREDNISONE 50 MG PO TABS
50.0000 mg | ORAL_TABLET | Freq: Every day | ORAL | Status: DC
  Administered 2022-08-01: 50 mg via ORAL
  Filled 2022-08-01: qty 1

## 2022-08-01 MED ORDER — OXYCODONE-ACETAMINOPHEN 5-325 MG PO TABS: 1.0000 | ORAL_TABLET | Freq: Four times a day (QID) | ORAL | 0 refills | Status: DC | PRN

## 2022-08-01 MED ORDER — AMIODARONE HCL 200 MG PO TABS: 200.0000 mg | ORAL_TABLET | Freq: Every day | ORAL | 1 refills | Status: AC

## 2022-08-01 MED ORDER — GABAPENTIN 100 MG PO CAPS: 200.0000 mg | ORAL_CAPSULE | Freq: Two times a day (BID) | ORAL | 0 refills | Status: DC

## 2022-08-01 MED ORDER — ATORVASTATIN CALCIUM 40 MG PO TABS: 40.0000 mg | ORAL_TABLET | Freq: Every day | ORAL | 1 refills | Status: DC

## 2022-08-01 MED ORDER — LEVOTHYROXINE SODIUM 125 MCG PO TABS: 125.0000 ug | ORAL_TABLET | Freq: Every day | ORAL | 1 refills | Status: DC

## 2022-08-01 MED ORDER — ASCORBIC ACID 500 MG PO TABS: 500.0000 mg | ORAL_TABLET | Freq: Two times a day (BID) | ORAL | 1 refills | Status: AC

## 2022-08-01 MED ORDER — IPRATROPIUM BROMIDE 0.02 % IN SOLN: 0.5000 mg | Freq: Two times a day (BID) | RESPIRATORY_TRACT | 12 refills | Status: AC

## 2022-08-01 MED ORDER — LISINOPRIL 2.5 MG PO TABS: 2.5000 mg | ORAL_TABLET | Freq: Every day | ORAL | 1 refills | Status: DC

## 2022-08-01 MED ORDER — SPIRONOLACTONE 25 MG PO TABS: 12.5000 mg | ORAL_TABLET | Freq: Every day | ORAL | 1 refills | Status: DC

## 2022-08-01 MED ORDER — IPRATROPIUM-ALBUTEROL 0.5-2.5 (3) MG/3ML IN SOLN: 3.0000 mL | Freq: Four times a day (QID) | RESPIRATORY_TRACT | Status: AC | PRN

## 2022-08-01 MED ORDER — LEVALBUTEROL HCL 0.63 MG/3ML IN NEBU: 0.6300 mg | INHALATION_SOLUTION | Freq: Two times a day (BID) | RESPIRATORY_TRACT | 12 refills | Status: AC

## 2022-08-01 NOTE — Discharge Summary (Addendum)
Physician Discharge Summary   Patient: Sarah [name] MRN: [id number] DOB: December 11, 1945  Admit date:     07/19/2022  Discharge date: 08/01/22  Discharge Physician: Annita Brod   PCP: Rusty Aus, MD   Recommendations at discharge:   New medication: Lasix 80 mg p.o. twice daily x4 days, then 40 mg p.o. twice daily x4 days then 40 mg p.o. daily New medication: Spironolactone 12.5 p.o. daily Medication change: Lopressor changed to 25 mg p.o. 3 times daily New medication: Tylenol 650 as needed New medication: Amiodarone 200 mg p.o. daily new medication: Vitamin C 500 p.o. twice daily New medication: Aspirin 81 mg p.o. daily New medication: Lipitor 40 mg p.o. daily New medication: Neurontin 200 mg p.o. twice daily New medication: Prednisone taper New medication: Xopenex and Atrovent nebs twice daily New medication: DuoNebs every 6 hours as needed Medication change: Synthroid decreased to 125 mcg New medication: Lisinopril 2.5 p.o. daily Medication change: Glucophage discontinued New medication: Multivitamin p.o. daily Medication change: Olmesartan/Norvasc discontinued New medication: Percocet 5/325 p.o. every 6 hours as needed for pain New medication: Zinc capsule p.o. daily Patient being discharged to skilled nursing Patient will follow-up with general surgery in 2 weeks Patient will follow-up with cardiology in 1 weeks Advise that patient to have TSH and free T4 and free T3 levels checked in 3 months Patient is to have basic metabolic panel checked in 1 week Patient is to get Ensure supplementation p.o. 3 times daily between meals  Discharge Diagnoses: Principal Problem:   Diverticulitis Active Problems:   NSTEMI (non-ST elevated myocardial infarction) ([place])   Acute systolic CHF (congestive heart failure) (HCC)   Protein-calorie malnutrition, severe   Atrial fibrillation with RVR ([place])   Uncontrolled type 2 diabetes mellitus with hyperglycemia, without long-term current  use of insulin (HCC)   Electrolyte abnormality including hyponatremia, hypokalemia, hypophosphatemia and hypomagnesemia   Pressure injury of skin   Hypothyroidism  Resolved Problems:   Acute hypoxemic respiratory failure ([place])   Septic shock (HCC)   Overweight (BMI 25.0-29.9)  Hospital Course: 76 year old female with past medical history of Graves' disease status post radiation therapy, diabetes mellitus, hypertension and diverticulitis with abscess formation status post CT-guided drain placement a few months ago who presented on 8/22 for a scheduled laparoscopic sigmoidectomy.  Patient underwent procedure which was converted to open Hartman's procedure.  Postop, patient developed atrial fibrillation with RVR, hypotension and non-STEMI.  Placed in the ICU for hypotension on pressor support.  Cardiology consulted and patient started on amiodarone drip.  Also developed acute on chronic congestive heart failure with an echocardiogram noting ejection fraction of 45 to 50%.    By 8/25, patient able to be weaned off of pressor support.  Over next few days, patient had worsening respiratory status initially requiring BiPAP and then transition to heated high flow nasal cannula.  Palliative care consulted.  By 8/30, patient's oxygenation able to be weaned down and currently on 4 L nasal cannula.  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  Patient not a candidate for CABG given comorbidities.  Plan for now is to attempt medical therapy.  To date, patient has diuresed over 5.5 L of fluid.  General surgery removed JP drain.  Wound exposed and packed with wet-to-dry dressing.  PT and OT recommending skilled nursing and patient excepted on 9/4.  Assessment and  Plan: * Diverticulitis Status post partial sigmoidectomy along with Hartman's pouch.  General  surgery following.  Has removed JP drain and now with daily wound care, wet-to-dry.  Surgery followed up cleaned out small pocket of fluid.  Continue daily wet-to-dry dressing changes which will be continued to be done at skilled nursing.  Follow-up with general surgery as outpatient.  Ostomy care plan noted as well.  NSTEMI (non-ST elevated myocardial infarction) [facility]) Status post cardiac catheterization with ejection fraction of 25% noted.  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  Not a candidate for CABG.  For now, medical management with plans for future intervention if possible.  Continue aspirin and Lipitor, beta-blocker and ACE inhibitor.  Outpatient cardiology follow-up.  Acute hypoxemic respiratory failure (HCC)-resolved as of 08/01/2022 Secondary to CHF and possibly HCAP.  Weaned off of oxygen, on room air  Septic shock (HCC)-resolved as of 07/28/2022 Secondary to intra-abdominal infection and UTI.  Weaned off of pressor support.  Completed full course of antibiotics.  Acute systolic CHF (congestive heart failure) ([place]) Responding well to diuretics, has diuresed almost 12 L of fluid and is -10.5 L and is down almost 16 pounds.Marland Kitchen  BNP checked on day of discharge still elevated at 1400 although down from 4000 a few days prior.  Plan to discharge patient on Lasix taper of 80 mg twice a day for 4 days, then 40 mg twice a day for 4 days, then 40 mg daily.  Check basic metabolic panel in 1 week to ensure potassium is stable  Uncontrolled type 2 diabetes mellitus with hyperglycemia, without long-term current use of insulin (HCC) A1C several months ago at 8.3.  Currently on sliding scale only as well as steroids.  Metformin discontinued secondary to heart failure.  Amaryl to be resumed.  CBGs will improve once prednisone discontinued.  To consider adding additional oral medications, but can defer this to the attending physician at her skilled nursing facility.  Started on  gabapentin for peripheral neuropathy.  Dose decreased prior to discharge due to increased drowsiness.  Atrial fibrillation with RVR ([place]) Presented when patient developed sepsis.  Continue amiodarone.  Rate controlled.  Would advise starting Eliquis and after next general surgery appointment and is cleared by general surgery, allowing abdominal wound to heal further.  Protein-calorie malnutrition, severe Nutrition Status: Nutrition Problem: Severe Malnutrition Etiology: acute illness Signs/Symptoms: percent weight loss, severe fat depletion, severe muscle depletion Percent weight loss: 21 %    Seen by nutrition.  On Ensure shakes 3 times daily.     Electrolyte abnormality including hyponatremia, hypokalemia, hypophosphatemia and hypomagnesemia Replacing as needed.  Pressure injury of skin Pressure Injury 07/21/22 Sacrum Bilateral;Mid Stage 2 -  Partial thickness loss of dermis presenting as a shallow open injury with a red, pink wound bed without slough. non-blanchable and blanchable redness with skin tear (Active)  07/21/22 1400  Location: Sacrum  Location Orientation: Bilateral;Mid  Staging: Stage 2 -  Partial thickness loss of dermis presenting as a shallow open injury with a red, pink wound bed without slough.  Wound Description (Comments): non-blanchable and blanchable redness with skin tear  Present on Admission: Yes (patient states she thinks she has had this wound from sitting in her recliner at home.)    Stage II sacral ulcer, present on admission.  Appreciate wound care help.  Sacral foam, changed every 3 days, at skilled nursing facility  Overweight (BMI 25.0-29.9)-resolved as of 08/01/2022 Meets criteria BMI greater than 25.  Please note  that patient's weight has significantly decreased following diuresis and therefore, BMI is no longer above 25.  Hypothyroidism Patient previously had been on Synthroid 150 mcg p.o. daily.  When she first presented, thyroid panel checked  found TSH suppressed with minimal elevation in free T3.  Synthroid initially held and resumed prior to discharge at 125 mcg p.o. daily.  Recommend patient have thyroid studies checked in 3 months.        Pain control - [employer] Controlled Substance Reporting System database was reviewed. and patient was instructed, not to drive, operate heavy machinery, perform activities at heights, swimming or participation in water activities or provide baby-sitting services while on Pain, Sleep and Anxiety Medications; until their outpatient Physician has advised to do so again. Also recommended to not to take more than prescribed Pain, Sleep and Anxiety Medications.  Consultants: Cardiology Surgery Critical care Wound care Diabetes coordinator   Procedures: Status post partial sigmoidectomy with conversion to Hartman's pouch Pressor support Cardiac catheterization  Disposition: Skilled nursing facility Diet recommendation:  Discharge Diet Orders (From admission, onward)     Start     Ordered   08/01/22 0000  Diet - low sodium heart healthy        08/01/22 1237           Cardiac diet with Ensure supplement p.o. 3 times daily between meals DISCHARGE MEDICATION: Allergies as of 08/01/2022       Reactions   Penicillins Anaphylaxis   TOLERATED CEFOTETAN 06/2022   Sulfa Antibiotics Rash        Medication List     STOP taking these medications    metFORMIN 500 MG tablet Commonly known as: GLUCOPHAGE   metoprolol succinate 50 MG 24 hr tablet Commonly known as: TOPROL-XL   Olmesartan-amLODIPine-HCTZ 40-10-25 MG Tabs   potassium chloride 10 MEQ tablet Commonly known as: KLOR-CON       TAKE these medications    acetaminophen 325 MG tablet Commonly known as: TYLENOL Take 2 tablets (650 mg total) by mouth every 4 (four) hours as needed for headache or mild pain.   amiodarone 200 MG tablet Commonly known as: PACERONE Take 1 tablet (200 mg total) by mouth  daily. Start taking on: August 02, 2022   ascorbic acid 500 MG tablet Commonly known as: VITAMIN C Take 1 tablet (500 mg total) by mouth 2 (two) times daily.   aspirin 81 MG chewable tablet Chew 1 tablet (81 mg total) by mouth daily. Start taking on: August 02, 2022   atorvastatin 40 MG tablet Commonly known as: LIPITOR Take 1 tablet (40 mg total) by mouth at bedtime.   cyanocobalamin 1000 MCG tablet Take 1,000 mcg by mouth daily.   feeding supplement Liqd Take 237 mLs by mouth 3 (three) times daily between meals.   furosemide 40 MG tablet Commonly known as: Lasix Take 2 tablets (80 mg total) by mouth 2 (two) times daily for 4 days, THEN 1 tablet (40 mg total) 2 (two) times daily for 4 days, THEN 1 tablet (40 mg total) daily. Start taking on: August 01, 2022   gabapentin 100 MG capsule Commonly known as: NEURONTIN Take 2 capsules (200 mg total) by mouth 2 (two) times daily.   glimepiride 4 MG tablet Commonly known as: AMARYL Take 4 mg by mouth daily with breakfast.   ipratropium 0.02 % nebulizer solution Commonly known as: ATROVENT Take 2.5 mLs (0.5 mg total) by nebulization 2 (two) times daily.   ipratropium-albuterol 0.5-2.5 (3) MG/3ML Soln  Commonly known as: DUONEB Take 3 mLs by nebulization every 6 (six) hours as needed.   levalbuterol 0.63 MG/3ML nebulizer solution Commonly known as: XOPENEX Take 3 mLs (0.63 mg total) by nebulization 2 (two) times daily.   levothyroxine 125 MCG tablet Commonly known as: SYNTHROID Take 1 tablet (125 mcg total) by mouth daily before breakfast. What changed:  medication strength how much to take   lisinopril 2.5 MG tablet Commonly known as: ZESTRIL Take 1 tablet (2.5 mg total) by mouth daily. Start taking on: August 02, 2022   metoprolol tartrate 25 MG tablet Commonly known as: LOPRESSOR Take 1 tablet (25 mg total) by mouth 3 (three) times daily.   multivitamin with minerals Tabs tablet Take 1 tablet by mouth  daily. Start taking on: August 02, 2022   ondansetron 4 MG disintegrating tablet Commonly known as: ZOFRAN-ODT Take 1 tablet (4 mg total) by mouth every 8 (eight) hours as needed for nausea or vomiting.   oxyCODONE-acetaminophen 5-325 MG tablet Commonly known as: PERCOCET/ROXICET Take 1 tablet by mouth every 6 (six) hours as needed for moderate pain.   predniSONE 10 MG tablet Commonly known as: DELTASONE Take 4 tablets (40 mg total) by mouth daily with breakfast for 1 day, THEN 3 tablets (30 mg total) daily with breakfast for 1 day, THEN 2 tablets (20 mg total) daily with breakfast for 1 day, THEN 1 tablet (10 mg total) daily with breakfast for 1 day. Start taking on: August 01, 2022   sertraline 100 MG tablet Commonly known as: ZOLOFT Take 100 mg by mouth daily.   simvastatin 20 MG tablet Commonly known as: ZOCOR Take 20 mg by mouth daily.   sodium chloride flush 0.9 % Soln Commonly known as: NS 5 mLs by Intracatheter route every 8 (eight) hours.   spironolactone 25 MG tablet Commonly known as: ALDACTONE Take 0.5 tablets (12.5 mg total) by mouth daily. Start taking on: August 02, 2022   venlafaxine 37.5 MG tablet Commonly known as: EFFEXOR Take 37.5 mg by mouth daily.   zinc sulfate 220 (50 Zn) MG capsule Take 1 capsule (220 mg total) by mouth daily. Start taking on: August 02, 2022               Discharge Care Instructions  (From admission, onward)           Start     Ordered   08/01/22 0000  Discharge wound care:       Comments: Ostomy care:Empty pouch when 1/3  to  full of stool/flatus Clean bottom 2-inches of fecal pouch prior to resealing Assist patient in emptying pouch Connect urinary pouch to bedside drain bag while in bed; disconnect patient from bedside drainage bag when OOB Change pouch twice weekly and PRN.  Write date of pouch application on pouch. Have spare pouch at bedside at all times Use Ostomy Teaching Booklet (provided by  [facility] nurse) for teaching/reinforcing patient and caregiver education regarding ostomy Order Supplies: 2 3/4" skin barrier /2 3/4" pouch/2" ostomy barrier ring (supplies on unit in supply room)  Abdominal wound: Wet-to-dry dressing changes daily  Sacral pressure ulcer: Sacral foam, change every 3 days   08/01/22 1237            Follow-up Information     Callwood, Dwayne D, MD. Go in 1 week(s).   Specialties: Cardiology, Internal Medicine Contact information: [address] [place] [address] [phone]  Discharge Exam: Filed Weights   07/30/22 1443 07/31/22 0524 08/01/22 0600  Weight: 66.5 kg 67.6 kg 60.1 kg   General: Alert and oriented x2, no acute distress Cardiovascular: Irregular rhythm, rate controlled Lungs: Clear to auscultation bilaterally  Condition at discharge: improving  The results of significant diagnostics from this hospitalization (including imaging, microbiology, ancillary and laboratory) are listed below for reference.   Imaging Studies: ECHOCARDIOGRAM LIMITED  Result Date: 07/27/2022    ECHOCARDIOGRAM LIMITED REPORT   Patient Name:   Sarah [name] Date of Exam: 07/26/2022 Medical Rec #:  [id number]   Height:       63.0 in Accession #:    [id number]  Weight:       155.0 lb Date of Birth:  07-09-1946   BSA:          1.735 m Patient Age:    45 years    BP:           123/77 mmHg Patient Gender: F           HR:           98 bpm. Exam Location:  [facility] Procedure: Limited Echo, Cardiac Doppler and Color Doppler Indications:     NSTEMI I21.4  History:         Patient has prior history of Echocardiogram examinations, most                  recent 06/19/2022. Risk Factors:Hypertension and Diabetes.                  Cancer.  Sonographer:     Sherrie Sport Referring Phys:  [id number] [place] TANG Diagnosing Phys: Yolonda Kida MD IMPRESSIONS  1. Left ventricular ejection fraction, by estimation, is 25 to 30%. The left ventricle has severely  decreased function. The left ventricle demonstrates global hypokinesis. The left ventricular internal cavity size was mildly dilated. Left ventricular diastolic function could not be evaluated.  2. The right ventricular size is mildly enlarged.  3. Left atrial size was mildly dilated.  4. Right atrial size was mildly dilated.  5. The mitral valve is normal in structure. FINDINGS  Left Ventricle: Left ventricular ejection fraction, by estimation, is 25 to 30%. The left ventricle has severely decreased function. The left ventricle demonstrates global hypokinesis. The left ventricular internal cavity size was mildly dilated. There is no left ventricular hypertrophy. Left ventricular diastolic function could not be evaluated. Right Ventricle: The right ventricular size is mildly enlarged. No increase in right ventricular wall thickness. Left Atrium: Left atrial size was mildly dilated. Right Atrium: Right atrial size was mildly dilated. Pericardium: There is no evidence of pericardial effusion. Mitral Valve: The mitral valve is normal in structure. Tricuspid Valve: The tricuspid valve is normal in structure. Tricuspid valve regurgitation is mild. Pulmonic Valve: The pulmonic valve was normal in structure. Pulmonic valve regurgitation is trivial. Aorta: The ascending aorta was not well visualized. LEFT VENTRICLE PLAX 2D LVIDd:         5.20 cm LVIDs:         4.80 cm LV PW:         1.00 cm LV IVS:        1.10 cm  LV Volumes (MOD) LV vol d, MOD A2C: 108.0 ml LV vol d, MOD A4C: 126.0 ml LV vol s, MOD A2C: 83.3 ml LV vol s, MOD A4C: 97.7 ml LV SV MOD A2C:     24.7 ml LV SV MOD  A4C:     126.0 ml LV SV MOD BP:      25.7 ml LEFT ATRIUM         Index LA diam:    4.70 cm 2.71 cm/m   AORTA Ao Root diam: 2.90 cm TRICUSPID VALVE TR Peak grad:   30.9 mmHg TR Vmax:        278.00 cm/s Yolonda Kida MD Electronically signed by Yolonda Kida MD Signature Date/Time: 07/27/2022/7:41:23 AM    Final    DG Chest Port 1 View  Result  Date: 07/26/2022 CLINICAL DATA:  Acute respiratory failure and hypoxia. EXAM: PORTABLE CHEST 1 VIEW COMPARISON:  07/25/2022 FINDINGS: There is a right arm PICC line with tip in the right atrium. Stable cardiomediastinal contours. Bilateral multifocal airspace opacities are unchanged in the interval. No new findings. IMPRESSION: No change in aeration to the lungs compared with previous exam. Electronically Signed   By: Kerby Moors M.D.   On: 07/26/2022 11:05   DG Chest Port 1 View  Result Date: 07/25/2022 CLINICAL DATA:  Acute respiratory failure EXAM: PORTABLE CHEST 1 VIEW COMPARISON:  07/24/2022 FINDINGS: Cardiac shadow is enlarged but stable. Right PICC is seen deep in the right atrium but stable. Patchy airspace opacities are again identified bilaterally but mildly improved when compared with the prior study. No bony abnormality is noted. IMPRESSION: Slight improved aeration when compared with the previous exam. Electronically Signed   By: Inez Catalina M.D.   On: 07/25/2022 04:10   DG Chest Port 1 View  Result Date: 07/24/2022 CLINICAL DATA:  Acute respiratory failure EXAM: PORTABLE CHEST 1 VIEW COMPARISON:  July 23, 2022 FINDINGS: A right PICC line terminates near the caval atrial junction. The cardiomediastinal silhouette is stable. Bilateral pulmonary opacities, particularly in the perihilar regions, have worsened. Probable skin fold over the lateral right lung with lung markings on both sides. No convincing evidence of pneumothorax. Decreased lung volumes. No other acute abnormalities. IMPRESSION: 1. The linear stripe over the lateral right mid chest is likely a skin fold as there are lung markings on both sides. No convincing evidence of pneumothorax. Recommend attention on follow-up. 2. Stable right PICC line. 3. Increasing bilateral perihilar opacities. While pulmonary edema is possible, the findings could represent a multifocal infectious process/pneumonia. Recommend clinical correlation and  attention on follow-up. Electronically Signed   By: Dorise Bullion III M.D.   On: 07/24/2022 07:52   DG Chest Port 1 View  Result Date: 07/23/2022 CLINICAL DATA:  Hypoxic respiratory failure. EXAM: PORTABLE CHEST 1 VIEW COMPARISON:  Portable chest yesterday at 8:27 a.m. FINDINGS: 4:46 a.m. Right PICC tip remains in the upper right atrium. Moderate cardiomegaly is stable. Today there is increased perihilar vascular congestion and central interstitial edema. There are stable small pleural effusions and overlying consolidation or atelectasis, denser on the left. There is increased perihilar opacity on the right which could be atelectasis, infiltrate or ground-glass edema. The more cephalad lungs remain clear. In all other respects no further changes. There is no pneumothorax.  Aortic atherosclerosis. IMPRESSION: 1. Increased perihilar vascular congestion and mild central interstitial edema. 2. Similar layering pleural effusions and overlying lung opacities. 3. Increased right mid perihilar opacity. Electronically Signed   By: Telford Nab M.D.   On: 07/23/2022 06:21   DG Chest Port 1 View  Result Date: 07/22/2022 CLINICAL DATA:  Status post sigmoid colectomy. EXAM: PORTABLE CHEST 1 VIEW COMPARISON:  July 21, 2022. FINDINGS: Stable cardiomegaly. Interval placement of right-sided PICC line  with distal tip in expected position of cavoatrial junction. Mild bibasilar subsegmental atelectasis is noted. Bony thorax is unremarkable. IMPRESSION: Interval placement of right-sided PICC line. Mild bibasilar subsegmental atelectasis is noted with probable minimal pleural effusions. Electronically Signed   By: Marijo Conception M.D.   On: 07/22/2022 08:47   [place] EKG SITE RITE  Result Date: 07/21/2022 If Site Rite image not attached, placement could not be confirmed due to current cardiac rhythm.  DG Abd 1 View  Result Date: 07/21/2022 CLINICAL DATA:  [id number].  Abdominal pain EXAM: ABDOMEN - 1 VIEW COMPARISON:   06/21/2015 FINDINGS: Multiple surgical skin staples overlie the lower abdomen. Normal abdominal gas pattern. No gross free intraperitoneal gas. Cholecystectomy clips are seen in the right upper quadrant. Surgical drain overlies the mid pelvis. No organomegaly. Osseous structures are age-appropriate. IMPRESSION: Normal abdominal gas pattern. Electronically Signed   By: Fidela Salisbury M.D.   On: 07/21/2022 00:36   DG Chest 1 View  Result Date: 07/21/2022 CLINICAL DATA:  Abdominal pain.  [id number] EXAM: CHEST  1 VIEW COMPARISON:  02/23/2021 FINDINGS: Lungs volumes are small, but are symmetric and are clear. No pneumothorax or pleural effusion. Cardiac size within normal limits. Pulmonary vascularity is normal. Osseous structures are age-appropriate. No acute bone abnormality. IMPRESSION: No active disease. Electronically Signed   By: Fidela Salisbury M.D.   On: 07/21/2022 00:35   ECHOCARDIOGRAM COMPLETE  Result Date: 07/20/2022    ECHOCARDIOGRAM REPORT   Patient Name:   Sarah [name] Date of Exam: 07/20/2022 Medical Rec #:  [id number]   Height:       63.0 in Accession #:    [id number]  Weight:       122.0 lb Date of Birth:  07-Sep-1946   BSA:          1.567 m Patient Age:    89 years    BP:           105/73 mmHg Patient Gender: F           HR:           126 bpm. Exam Location:  [facility] Procedure: 2D Echo, Color Doppler and Cardiac Doppler Indications:     I21.4 NSTEMI  History:         Patient has no prior history of Echocardiogram examinations.                  Arrythmias:Tachycardia; Risk Factors:Hypertension, Diabetes and                  Dyslipidemia.  Sonographer:     Charmayne Sheer Referring Phys:  [id number] [place] TANG Diagnosing Phys: Yolonda Kida MD  Sonographer Comments: No subcostal window. IMPRESSIONS  1. Left ventricular ejection fraction, by estimation, is 45 to 50%. The left ventricle has mildly decreased function. The left ventricle demonstrates global hypokinesis. The left ventricular internal cavity  size was mildly to moderately dilated. Left ventricular diastolic parameters were normal.  2. Right ventricular systolic function is normal. The right ventricular size is normal.  3. The mitral valve is normal in structure. No evidence of mitral valve regurgitation.  4. The aortic valve is normal in structure. Aortic valve regurgitation is not visualized. FINDINGS  Left Ventricle: Left ventricular ejection fraction, by estimation, is 45 to 50%. The left ventricle has mildly decreased function. The left ventricle demonstrates global hypokinesis. The left ventricular internal cavity size was mildly to moderately dilated. There is borderline left ventricular hypertrophy. Left  ventricular diastolic parameters were normal. Right Ventricle: The right ventricular size is normal. No increase in right ventricular wall thickness. Right ventricular systolic function is normal. Left Atrium: Left atrial size was normal in size. Right Atrium: Right atrial size was normal in size. Pericardium: There is no evidence of pericardial effusion. Mitral Valve: The mitral valve is normal in structure. No evidence of mitral valve regurgitation. Tricuspid Valve: The tricuspid valve is normal in structure. Tricuspid valve regurgitation is trivial. Aortic Valve: The aortic valve is normal in structure. Aortic valve regurgitation is not visualized. Aortic valve mean gradient measures 5.0 mmHg. Aortic valve peak gradient measures 9.0 mmHg. Aortic valve area, by VTI measures 2.19 cm. Pulmonic Valve: The pulmonic valve was normal in structure. Pulmonic valve regurgitation is not visualized. Aorta: The ascending aorta was not well visualized. IAS/Shunts: No atrial level shunt detected by color flow Doppler.  LEFT VENTRICLE PLAX 2D LVIDd:         5.24 cm   Diastology LVIDs:         3.95 cm   LV e' medial:    4.79 cm/s LV PW:         1.53 cm   LV E/e' medial:  17.3 LV IVS:        0.93 cm   LV e' lateral:   7.51 cm/s LVOT diam:     2.00 cm   LV E/e'  lateral: 11.1 LV SV:         51 LV SV Index:   32 LVOT Area:     3.14 cm  RIGHT VENTRICLE RV Basal diam:  2.79 cm RV S prime:     26.90 cm/s LEFT ATRIUM           Index        RIGHT ATRIUM           Index LA diam:      3.80 cm 2.42 cm/m   RA Area:     15.10 cm LA Vol (A2C): 33.8 ml 21.57 ml/m  RA Volume:   38.80 ml  24.76 ml/m  AORTIC VALVE AV Area (Vmax):    1.94 cm AV Area (Vmean):   1.77 cm AV Area (VTI):     2.19 cm AV Vmax:           150.00 cm/s AV Vmean:          110.000 cm/s AV VTI:            0.232 m AV Peak Grad:      9.0 mmHg AV Mean Grad:      5.0 mmHg LVOT Vmax:         92.70 cm/s LVOT Vmean:        61.900 cm/s LVOT VTI:          0.162 m LVOT/AV VTI ratio: 0.70  AORTA Ao Root diam: 3.10 cm MITRAL VALVE MV Area (PHT): 10.25 cm   SHUNTS MV Decel Time: 74 msec     Systemic VTI:  0.16 m MV E velocity: 83.05 cm/s  Systemic Diam: 2.00 cm MV A velocity: 78.65 cm/s MV E/A ratio:  1.06 Yolonda Kida MD Electronically signed by Yolonda Kida MD Signature Date/Time: 07/20/2022/5:44:47 PM    Final     Microbiology: Results for orders placed or performed during the hospital encounter of 07/19/22  MRSA Next Gen by PCR, Nasal     Status: None   Collection Time: 07/21/22 11:20 AM   Specimen: Nasal Mucosa;  Nasal Swab  Result Value Ref Range Status   MRSA by PCR Next Gen NOT DETECTED NOT DETECTED Final    Comment: (NOTE) The GeneXpert MRSA Assay (FDA approved for NASAL specimens only), is one component of a comprehensive MRSA colonization surveillance program. It is not intended to diagnose MRSA infection nor to guide or monitor treatment for MRSA infections. Test performance is not FDA approved in patients less than 65 years old. Performed at [facility], [address] [place], [place] [address]   Urine Culture     Status: Abnormal   Collection Time: 07/21/22 11:28 AM   Specimen: Urine, Catheterized  Result Value Ref Range Status   Specimen Description   Final    URINE,  CATHETERIZED Performed at [facility], [address] [place], [place] [address]    Special Requests   Final    NONE Performed at [facility], [place] [address], [place] [address]    Culture 40,000 COLONIES/mL YEAST (A)  Final   Report Status 07/23/2022 FINAL  Final  Respiratory (~20 pathogens) panel by PCR     Status: None   Collection Time: 07/24/22 10:32 AM   Specimen: Nasopharyngeal Swab; Respiratory  Result Value Ref Range Status   Adenovirus NOT DETECTED NOT DETECTED Final   Coronavirus 229E NOT DETECTED NOT DETECTED Final    Comment: (NOTE) The Coronavirus on the Respiratory Panel, DOES NOT test for the novel  Coronavirus (2019 nCoV)    Coronavirus HKU1 NOT DETECTED NOT DETECTED Final   Coronavirus NL63 NOT DETECTED NOT DETECTED Final   Coronavirus OC43 NOT DETECTED NOT DETECTED Final   Metapneumovirus NOT DETECTED NOT DETECTED Final   Rhinovirus / Enterovirus NOT DETECTED NOT DETECTED Final   Influenza A NOT DETECTED NOT DETECTED Final   Influenza B NOT DETECTED NOT DETECTED Final   Parainfluenza Virus 1 NOT DETECTED NOT DETECTED Final   Parainfluenza Virus 2 NOT DETECTED NOT DETECTED Final   Parainfluenza Virus 3 NOT DETECTED NOT DETECTED Final   Parainfluenza Virus 4 NOT DETECTED NOT DETECTED Final   Respiratory Syncytial Virus NOT DETECTED NOT DETECTED Final   Bordetella pertussis NOT DETECTED NOT DETECTED Final   Bordetella Parapertussis NOT DETECTED NOT DETECTED Final   Chlamydophila pneumoniae NOT DETECTED NOT DETECTED Final   Mycoplasma pneumoniae NOT DETECTED NOT DETECTED Final    Comment: Performed at [facility], [place] [address] [place], [place] [address]  MRSA Next Gen by PCR, Nasal     Status: None   Collection Time: 07/24/22 10:35 AM   Specimen: Anterior Nasal Swab  Result Value Ref Range Status   MRSA by PCR Next Gen NOT DETECTED NOT DETECTED Final    Comment: (NOTE) The GeneXpert MRSA Assay (FDA approved for NASAL  specimens only), is one component of a comprehensive MRSA colonization surveillance program. It is not intended to diagnose MRSA infection nor to guide or monitor treatment for MRSA infections. Test performance is not FDA approved in patients less than 48 years old. Performed at [facility], [address] [place],  [address]   SARS Coronavirus 2 by RT PCR (hospital order, performed in [facility] hospital lab) *cepheid single result test* Anterior Nasal Swab     Status: None   Collection Time: 07/24/22 10:35 AM   Specimen: Anterior Nasal Swab  Result Value Ref Range Status   SARS Coronavirus 2 by RT PCR NEGATIVE NEGATIVE Final    Comment: (NOTE) SARS-CoV-2 target nucleic acids are  NOT DETECTED.  The SARS-CoV-2 RNA is generally detectable in upper and lower respiratory specimens during the acute phase of infection. The lowest concentration of SARS-CoV-2 viral copies this assay can detect is 250 copies / mL. A negative result does not preclude SARS-CoV-2 infection and should not be used as the sole basis for treatment or other patient management decisions.  A negative result may occur with improper specimen collection / handling, submission of specimen other than nasopharyngeal swab, presence of viral mutation(s) within the areas targeted by this assay, and inadequate number of viral copies (<250 copies / mL). A negative result must be combined with clinical observations, patient history, and epidemiological information.  Fact Sheet for Patients:   https://www.patel.info/  Fact Sheet for Healthcare Providers: https://hall.com/  This test is not yet approved or  cleared by the [place] FDA and has been authorized for detection and/or diagnosis of SARS-CoV-2 by FDA under an Emergency Use Authorization (EUA).  This EUA will remain in effect (meaning this test can be used) for the duration of the COVID-19 declaration  under Section 564(b)(1) of the Act, 21 U.S.C. section 360bbb-3(b)(1), unless the authorization is terminated or revoked sooner.  Performed at [facility], [address] [place], [place] [address]     Labs: CBC: Recent Labs  Lab 07/28/22 [id number] 07/29/22 1347 07/30/22 0438 07/31/22 0910 08/01/22 0541  WBC 8.1 11.2* 9.7 12.3* 10.2  HGB 9.0* 10.4* 9.5* 11.0* 10.5*  HCT 30.1* 33.1* 31.7* 35.8* 34.1*  MCV 92.9 90.4 92.4 92.0 90.7  PLT 281 337 294 311 606   Basic Metabolic Panel: Recent Labs  Lab 07/28/22 0514 07/29/22 0424 07/29/22 1347 07/30/22 0438 07/31/22 0910 08/01/22 0541  NA 141  --  140 141 138 138  K 3.5  --  3.7 3.5 4.1 3.6  CL 106  --  102 100 97* 92*  CO2 30  --  31 33* 34* 34*  GLUCOSE 153*  --  221* 185* 275* 254*  BUN 40*  --  35* 35* 34* 41*  CREATININE 0.60  --  0.48 0.56 0.54 0.53  CALCIUM 8.0*  --  7.9* 7.7* 7.8* 8.2*  MG 1.9 1.8  --  1.8 2.0 1.9  PHOS 2.5 2.3*  --  3.0 2.9 3.0   Liver Function Tests: Recent Labs  Lab 07/28/22 0514  AST 62*  ALT 36  ALKPHOS 72  BILITOT 0.5  PROT 5.3*  ALBUMIN 2.3*   CBG: Recent Labs  Lab 07/31/22 2117 08/01/22 0033 08/01/22 0545 08/01/22 0804 08/01/22 1231  GLUCAP 171* 274* 221* 230* 267*    Discharge time spent: greater than 30 minutes.  Signed: Annita Brod, MD [facility] 08/01/2022

## 2022-08-01 NOTE — Progress Notes (Signed)
[facility] Cardiology    SUBJECTIVE: Patient states to be doing somewhat better no shortness of breath no chest pain strength is improving appetite is still poor but is eating again and eating slowly   Vitals:   08/01/22 0538 08/01/22 0600 08/01/22 0806 08/01/22 0808  BP: 138/66  128/67   Pulse:   63   Resp: 16  19   Temp: (!) 97.5 F (36.4 C)  (!) 97.5 F (36.4 C)   TempSrc: Oral     SpO2: 93%  94% 94%  Weight:  60.1 kg    Height:         Intake/Output Summary (Last 24 hours) at 08/01/2022 1610 Last data filed at 08/01/2022 9604 Gross per 24 hour  Intake 330 ml  Output 2850 ml  Net -2520 ml      PHYSICAL EXAM  General: Well developed, well nourished, in no acute distress HEENT:  Normocephalic and atramatic Neck:  No JVD.  Lungs: Clear bilaterally to auscultation and percussion. Heart: HRRR . Normal S1 and S2 without gallops or murmurs.  Abdomen: Bowel sounds are positive, abdomen soft and non-tender  Msk:  Back normal, normal gait. Normal strength and tone for age. Extremities: No clubbing, cyanosis or edema.   Neuro: Alert and oriented X 3. Psych:  Good affect, responds appropriately   LABS: Basic Metabolic Panel: Recent Labs    07/31/22 0910 08/01/22 0541  NA 138 138  K 4.1 3.6  CL 97* 92*  CO2 34* 34*  GLUCOSE 275* 254*  BUN 34* 41*  CREATININE 0.54 0.53  CALCIUM 7.8* 8.2*  MG 2.0 1.9  PHOS 2.9 3.0   Liver Function Tests: No results for input(s): "AST", "ALT", "ALKPHOS", "BILITOT", "PROT", "ALBUMIN" in the last 72 hours. No results for input(s): "LIPASE", "AMYLASE" in the last 72 hours. CBC: Recent Labs    07/31/22 0910 08/01/22 0541  WBC 12.3* 10.2  HGB 11.0* 10.5*  HCT 35.8* 34.1*  MCV 92.0 90.7  PLT 311 267   Cardiac Enzymes: No results for input(s): "CKTOTAL", "CKMB", "CKMBINDEX", "TROPONINI" in the last 72 hours. BNP: Invalid input(s): "POCBNP" D-Dimer: No results for input(s): "DDIMER" in the last 72 hours. Hemoglobin A1C: No results  for input(s): "HGBA1C" in the last 72 hours. Fasting Lipid Panel: No results for input(s): "CHOL", "HDL", "LDLCALC", "TRIG", "CHOLHDL", "LDLDIRECT" in the last 72 hours. Thyroid Function Tests: No results for input(s): "TSH", "T4TOTAL", "T3FREE", "THYROIDAB" in the last 72 hours.  Invalid input(s): "FREET3" Anemia Panel: No results for input(s): "VITAMINB12", "FOLATE", "FERRITIN", "TIBC", "IRON", "RETICCTPCT" in the last 72 hours.  No results found.   Echo moderate to severely depressed left ventricular function globally EF around 25 to 30%  TELEMETRY: Atrial fibrillation rate of around 90 nonspecific ST-T wave changes telemetry strips personally reviewed and interpreted by me:  ASSESSMENT AND PLAN:  Principal Problem:   Diverticulitis Active Problems:   Protein-calorie malnutrition, severe   Pressure injury of skin   NSTEMI (non-ST elevated myocardial infarction) (HCC)   Acute systolic CHF (congestive heart failure) (HCC)   Atrial fibrillation with RVR (HCC)   Electrolyte abnormality including hyponatremia, hypokalemia, hypophosphatemia and hypomagnesemia   Uncontrolled type 2 diabetes mellitus with hyperglycemia, without long-term current use of insulin (HCC)   Acute hypoxemic respiratory failure (HCC)   Overweight (BMI 25.0-29.9)    1. 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

## 2022-08-01 NOTE — Assessment & Plan Note (Signed)
Patient previously had been on Synthroid 150 mcg p.o. daily.  When she first presented, thyroid panel checked found TSH suppressed with minimal elevation in free T3.  Synthroid initially held and resumed prior to discharge at 125 mcg p.o. daily.  Recommend patient have thyroid studies checked in 3 months.

## 2022-08-01 NOTE — Consult Note (Addendum)
[place] Nurse ostomy follow up Stoma type/location: LLQ colostomy Stomal assessment/size: oval, raised, located in a crease with distal creasing; deepest crease at 3 o'clock Peristomal assessment: intact, despite undermining of stool at 3 o'clock Treatment options for stomal/peristomal skin: skin barrier ring Output : soft light brown stool, 75ms in pouch (recently emptied) Ostomy pouching: 2pc. 2 and 3/4 inch pouching sytem with skin barrier ring Education provided:  None today, patient thanks me after changing pouch and listens as I reiterate the necessary steps to changing her pouch. She is not able to learn at this time. She asks to be put back to bed x3 during this encounter. This is relayed staff at the completion of my visit. [place] Yes, previously. Current discharge plan is for discharge to SNF. I have prepared supplies for discharge, 4 2 and 3/4 inch skin barriers, 4 corresponding 2 and 3/4 inch pouches and 4, 2-inch skin barrier rings. There is a pattern for cutting out the skin barriers in the bag with the skin barriers and an education folder for downstream use. No visitors in room. Next visit is planned for Wednesday if patient is still in house.  [facility] will continue to follow along with you, and will remain available to this patient, the nursing, surgical, and medical teams.    Thank you for inviting [facility] participate in this patient's Plan of Care.  LMaudie Flakes MSN, RN, CNS, [place] CSerita Grammes [employer] FUnis[employer] Corporationphone:  [phone]

## 2022-08-01 NOTE — Discharge Summary (Signed)
Physician Discharge Summary   Patient: Sarah [name] MRN: [id number] DOB: 02/03/1946  Admit date:     07/19/2022  Discharge date: 08/01/22  Discharge Physician: Annita Brod   PCP: Rusty Aus, MD   Recommendations at discharge:   New medication: Lasix 80 mg p.o. twice daily x4 days, then 40 mg p.o. twice daily x4 days then 40 mg p.o. daily New medication: Spironolactone 12.5 p.o. daily Medication change: Lopressor changed to 25 mg p.o. 3 times daily New medication: Tylenol 650 as needed New medication: Amiodarone 200 mg p.o. daily new medication: Vitamin C 500 p.o. twice daily New medication: Aspirin 81 mg p.o. daily New medication: Lipitor 40 mg p.o. daily New medication: Neurontin 200 mg p.o. twice daily New medication: Prednisone taper New medication: Xopenex and Atrovent nebs twice daily New medication: DuoNebs every 6 hours as needed Medication change: Synthroid decreased to 125 mcg New medication: Lisinopril 2.5 p.o. daily Medication change: Glucophage discontinued New medication: Multivitamin p.o. daily Medication change: Olmesartan/Norvasc discontinued New medication: Percocet 5/325 p.o. every 6 hours as needed for pain New medication: Zinc capsule p.o. daily Patient being discharged to skilled nursing Patient will follow-up with general surgery in 2 weeks Patient will follow-up with cardiology in 1 weeks Advise that patient to have TSH and free T4 and free T3 levels checked in 3 months Patient is to have basic metabolic panel checked in 1 week Patient is to get Ensure supplementation p.o. 3 times daily between meals Rehab attending can start Eliquis on patient after patient cleared by general surgery at 2-week appointment  Discharge Diagnoses: Principal Problem:   Diverticulitis Active Problems:   NSTEMI (non-ST elevated myocardial infarction) ([place])   Acute systolic CHF (congestive heart failure) (HCC)   Protein-calorie malnutrition, severe   Atrial  fibrillation with RVR ([place])   Uncontrolled type 2 diabetes mellitus with hyperglycemia, without long-term current use of insulin (HCC)   Electrolyte abnormality including hyponatremia, hypokalemia, hypophosphatemia and hypomagnesemia   Pressure injury of skin   Hypothyroidism  Resolved Problems:   Acute hypoxemic respiratory failure ([place])   Septic shock (HCC)   Overweight (BMI 25.0-29.9)  Hospital Course: 76 year old female with past medical history of Graves' disease status post radiation therapy, diabetes mellitus, hypertension and diverticulitis with abscess formation status post CT-guided drain placement a few months ago who presented on 8/22 for a scheduled laparoscopic sigmoidectomy.  Patient underwent procedure which was converted to open Hartman's procedure.  Postop, patient developed atrial fibrillation with RVR, hypotension and non-STEMI.  Placed in the ICU for hypotension on pressor support.  Cardiology consulted and patient started on amiodarone drip.  Also developed acute on chronic congestive heart failure with an echocardiogram noting ejection fraction of 45 to 50%.    By 8/25, patient able to be weaned off of pressor support.  Over next few days, patient had worsening respiratory status initially requiring BiPAP and then transition to heated high flow nasal cannula.  Palliative care consulted.  By 8/30, patient's oxygenation able to be weaned down and currently on 4 L nasal cannula.  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  Patient not a candidate for CABG given comorbidities.  Plan for now is to attempt medical therapy.  To date, patient has diuresed over 5.5 L of fluid.  General surgery removed JP drain.  Wound exposed and packed with wet-to-dry  dressing.  PT and OT recommending skilled nursing and patient excepted on  9/4.  Assessment and Plan: * Diverticulitis Status post partial sigmoidectomy along with Hartman's pouch.  General surgery following.  Has removed JP drain and now with daily wound care, wet-to-dry.  Surgery followed up cleaned out small pocket of fluid.  Continue daily wet-to-dry dressing changes which will be continued to be done at skilled nursing.  Follow-up with general surgery as outpatient.  Ostomy care plan noted as well.  NSTEMI (non-ST elevated myocardial infarction) [facility]) Status post cardiac catheterization with ejection fraction of 25% noted.  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  Not a candidate for CABG.  For now, medical management with plans for future intervention if possible.  Continue aspirin and Lipitor, beta-blocker and ACE inhibitor.  Outpatient cardiology follow-up.  Acute hypoxemic respiratory failure (HCC)-resolved as of 08/01/2022 Secondary to CHF and possibly HCAP.  Weaned off of oxygen, on room air  Septic shock (HCC)-resolved as of 07/28/2022 Secondary to intra-abdominal infection and UTI.  Weaned off of pressor support.  Completed full course of antibiotics.  Acute systolic CHF (congestive heart failure) ([place]) Responding well to diuretics, has diuresed almost 12 L of fluid and is -10.5 L and is down almost 16 pounds.Marland Kitchen  BNP checked on day of discharge still elevated at 1400 although down from 4000 a few days prior.  Plan to discharge patient on Lasix taper of 80 mg twice a day for 4 days, then 40 mg twice a day for 4 days, then 40 mg daily.  Check basic metabolic panel in 1 week to ensure potassium is stable  Uncontrolled type 2 diabetes mellitus with hyperglycemia, without long-term current use of insulin (HCC) A1C several months ago at 8.3.  Currently on sliding scale only as well as steroids.  Metformin discontinued secondary to heart failure.  Amaryl to be resumed.  CBGs will improve once prednisone discontinued.  To consider adding  additional oral medications, but can defer this to the attending physician at her skilled nursing facility.  Started on gabapentin for peripheral neuropathy.  Dose decreased prior to discharge due to increased drowsiness.  Atrial fibrillation with RVR ([place]) Presented when patient developed sepsis.  Continue amiodarone.  Rate controlled.  Would advise starting Eliquis and after next general surgery appointment and is cleared by general surgery, allowing abdominal wound to heal further.  Protein-calorie malnutrition, severe Nutrition Status: Nutrition Problem: Severe Malnutrition Etiology: acute illness Signs/Symptoms: percent weight loss, severe fat depletion, severe muscle depletion Percent weight loss: 21 %    Seen by nutrition.  On Ensure shakes 3 times daily.     Electrolyte abnormality including hyponatremia, hypokalemia, hypophosphatemia and hypomagnesemia Replacing as needed.  Pressure injury of skin Pressure Injury 07/21/22 Sacrum Bilateral;Mid Stage 2 -  Partial thickness loss of dermis presenting as a shallow open injury with a red, pink wound bed without slough. non-blanchable and blanchable redness with skin tear (Active)  07/21/22 1400  Location: Sacrum  Location Orientation: Bilateral;Mid  Staging: Stage 2 -  Partial thickness loss of dermis presenting as a shallow open injury with a red, pink wound bed without slough.  Wound Description (Comments): non-blanchable and blanchable redness with skin tear  Present on Admission: Yes (patient states she thinks she has had this wound from sitting in her recliner at home.)    Stage II sacral ulcer, present on admission.  Appreciate wound care help.  Sacral foam, changed every 3 days, at skilled nursing facility  Overweight (BMI 25.0-29.9)-resolved as of 08/01/2022 Meets criteria BMI greater than 25.  Please note that patient's weight has significantly decreased following diuresis and therefore, BMI is no longer above  25.  Hypothyroidism Patient previously had been on Synthroid 150 mcg p.o. daily.  When she first presented, thyroid panel checked found TSH suppressed with minimal elevation in free T3.  Synthroid initially held and resumed prior to discharge at 125 mcg p.o. daily.  Recommend patient have thyroid studies checked in 3 months.        Pain control - [employer] Controlled Substance Reporting System database was reviewed. and patient was instructed, not to drive, operate heavy machinery, perform activities at heights, swimming or participation in water activities or provide baby-sitting services while on Pain, Sleep and Anxiety Medications; until their outpatient Physician has advised to do so again. Also recommended to not to take more than prescribed Pain, Sleep and Anxiety Medications.  Consultants: Cardiology Surgery Critical care Wound care Diabetes coordinator   Procedures: Status post partial sigmoidectomy with conversion to Hartman's pouch Pressor support Cardiac catheterization  Disposition: Skilled nursing facility Diet recommendation:  Discharge Diet Orders (From admission, onward)     Start     Ordered   08/01/22 0000  Diet - low sodium heart healthy        08/01/22 1237           Cardiac diet with Ensure supplement p.o. 3 times daily between meals DISCHARGE MEDICATION: Allergies as of 08/01/2022       Reactions   Penicillins Anaphylaxis   TOLERATED CEFOTETAN 06/2022   Sulfa Antibiotics Rash        Medication List     STOP taking these medications    metFORMIN 500 MG tablet Commonly known as: GLUCOPHAGE   metoprolol succinate 50 MG 24 hr tablet Commonly known as: TOPROL-XL   Olmesartan-amLODIPine-HCTZ 40-10-25 MG Tabs   potassium chloride 10 MEQ tablet Commonly known as: KLOR-CON       TAKE these medications    acetaminophen 325 MG tablet Commonly known as: TYLENOL Take 2 tablets (650 mg total) by mouth every 4 (four) hours as needed for  headache or mild pain.   amiodarone 200 MG tablet Commonly known as: PACERONE Take 1 tablet (200 mg total) by mouth daily. Start taking on: August 02, 2022   ascorbic acid 500 MG tablet Commonly known as: VITAMIN C Take 1 tablet (500 mg total) by mouth 2 (two) times daily.   aspirin 81 MG chewable tablet Chew 1 tablet (81 mg total) by mouth daily. Start taking on: August 02, 2022   atorvastatin 40 MG tablet Commonly known as: LIPITOR Take 1 tablet (40 mg total) by mouth at bedtime.   cyanocobalamin 1000 MCG tablet Take 1,000 mcg by mouth daily.   feeding supplement Liqd Take 237 mLs by mouth 3 (three) times daily between meals.   furosemide 40 MG tablet Commonly known as: Lasix Take 2 tablets (80 mg total) by mouth 2 (two) times daily for 4 days, THEN 1 tablet (40 mg total) 2 (two) times daily for 4 days, THEN 1 tablet (40 mg total) daily. Start taking on: August 01, 2022   gabapentin 100 MG capsule Commonly known as: NEURONTIN Take 2 capsules (200 mg total) by mouth 2 (two) times daily.   glimepiride 4 MG tablet Commonly known as: AMARYL Take 4 mg by mouth daily with breakfast.   ipratropium 0.02 % nebulizer solution Commonly known as: ATROVENT Take 2.5 mLs (0.5  mg total) by nebulization 2 (two) times daily.   ipratropium-albuterol 0.5-2.5 (3) MG/3ML Soln Commonly known as: DUONEB Take 3 mLs by nebulization every 6 (six) hours as needed.   levalbuterol 0.63 MG/3ML nebulizer solution Commonly known as: XOPENEX Take 3 mLs (0.63 mg total) by nebulization 2 (two) times daily.   levothyroxine 125 MCG tablet Commonly known as: SYNTHROID Take 1 tablet (125 mcg total) by mouth daily before breakfast. What changed:  medication strength how much to take   lisinopril 2.5 MG tablet Commonly known as: ZESTRIL Take 1 tablet (2.5 mg total) by mouth daily. Start taking on: August 02, 2022   metoprolol tartrate 25 MG tablet Commonly known as: LOPRESSOR Take 1  tablet (25 mg total) by mouth 3 (three) times daily.   multivitamin with minerals Tabs tablet Take 1 tablet by mouth daily. Start taking on: August 02, 2022   ondansetron 4 MG disintegrating tablet Commonly known as: ZOFRAN-ODT Take 1 tablet (4 mg total) by mouth every 8 (eight) hours as needed for nausea or vomiting.   oxyCODONE-acetaminophen 5-325 MG tablet Commonly known as: PERCOCET/ROXICET Take 1 tablet by mouth every 6 (six) hours as needed for moderate pain.   predniSONE 10 MG tablet Commonly known as: DELTASONE Take 4 tablets (40 mg total) by mouth daily with breakfast for 1 day, THEN 3 tablets (30 mg total) daily with breakfast for 1 day, THEN 2 tablets (20 mg total) daily with breakfast for 1 day, THEN 1 tablet (10 mg total) daily with breakfast for 1 day. Start taking on: August 01, 2022   sertraline 100 MG tablet Commonly known as: ZOLOFT Take 100 mg by mouth daily.   simvastatin 20 MG tablet Commonly known as: ZOCOR Take 20 mg by mouth daily.   sodium chloride flush 0.9 % Soln Commonly known as: NS 5 mLs by Intracatheter route every 8 (eight) hours.   spironolactone 25 MG tablet Commonly known as: ALDACTONE Take 0.5 tablets (12.5 mg total) by mouth daily. Start taking on: August 02, 2022   venlafaxine 37.5 MG tablet Commonly known as: EFFEXOR Take 37.5 mg by mouth daily.   zinc sulfate 220 (50 Zn) MG capsule Take 1 capsule (220 mg total) by mouth daily. Start taking on: August 02, 2022               Discharge Care Instructions  (From admission, onward)           Start     Ordered   08/01/22 0000  Discharge wound care:       Comments: Ostomy care:Empty pouch when 1/3  to  full of stool/flatus Clean bottom 2-inches of fecal pouch prior to resealing Assist patient in emptying pouch Connect urinary pouch to bedside drain bag while in bed; disconnect patient from bedside drainage bag when OOB Change pouch twice weekly and PRN.  Write  date of pouch application on pouch. Have spare pouch at bedside at all times Use Ostomy Teaching Booklet (provided by [facility] nurse) for teaching/reinforcing patient and caregiver education regarding ostomy Order Supplies: 2 3/4" skin barrier /2 3/4" pouch/2" ostomy barrier ring (supplies on unit in supply room)  Abdominal wound: Wet-to-dry dressing changes daily  Sacral pressure ulcer: Sacral foam, change every 3 days   08/01/22 1237            Follow-up Information     Callwood, Dwayne D, MD. Go in 1 week(s).   Specialties: Cardiology, Internal Medicine Contact information: [address]  [place]         Benjamine Sprague, DO. Schedule an appointment as soon as possible for a visit in 2 week(s).   Specialty: Surgery Contact information: [address] [place] [address] [phone]                Discharge Exam: Danley Danker Weights   07/30/22 1443 07/31/22 0524 08/01/22 0600  Weight: 66.5 kg 67.6 kg 60.1 kg   General: Alert and oriented x2, no acute distress Cardiovascular: Irregular rhythm, rate controlled Lungs: Clear to auscultation bilaterally  Condition at discharge: improving  The results of significant diagnostics from this hospitalization (including imaging, microbiology, ancillary and laboratory) are listed below for reference.   Imaging Studies: ECHOCARDIOGRAM LIMITED  Result Date: 07/27/2022    ECHOCARDIOGRAM LIMITED REPORT   Patient Name:   Sarah [name] Date of Exam: 07/26/2022 Medical Rec #:  [id number]   Height:       63.0 in Accession #:    [id number]  Weight:       155.0 lb Date of Birth:  08-27-1946   BSA:          1.735 m Patient Age:    89 years    BP:           123/77 mmHg Patient Gender: F           HR:           98 bpm. Exam Location:  [facility] Procedure: Limited Echo, Cardiac Doppler and Color Doppler Indications:     NSTEMI I21.4  History:         Patient has prior history of Echocardiogram examinations, most                   recent 06/19/2022. Risk Factors:Hypertension and Diabetes.                  Cancer.  Sonographer:     Sherrie Sport Referring Phys:  [id number] Frazee TANG Diagnosing Phys: Yolonda Kida MD IMPRESSIONS  1. Left ventricular ejection fraction, by estimation, is 25 to 30%. The left ventricle has severely decreased function. The left ventricle demonstrates global hypokinesis. The left ventricular internal cavity size was mildly dilated. Left ventricular diastolic function could not be evaluated.  2. The right ventricular size is mildly enlarged.  3. Left atrial size was mildly dilated.  4. Right atrial size was mildly dilated.  5. The mitral valve is normal in structure. FINDINGS  Left Ventricle: Left ventricular ejection fraction, by estimation, is 25 to 30%. The left ventricle has severely decreased function. The left ventricle demonstrates global hypokinesis. The left ventricular internal cavity size was mildly dilated. There is no left ventricular hypertrophy. Left ventricular diastolic function could not be evaluated. Right Ventricle: The right ventricular size is mildly enlarged. No increase in right ventricular wall thickness. Left Atrium: Left atrial size was mildly dilated. Right Atrium: Right atrial size was mildly dilated. Pericardium: There is no evidence of pericardial effusion. Mitral Valve: The mitral valve is normal in structure. Tricuspid Valve: The tricuspid valve is normal in structure. Tricuspid valve regurgitation is mild. Pulmonic Valve: The pulmonic valve was normal in structure. Pulmonic valve regurgitation is trivial. Aorta: The ascending aorta was not well visualized. LEFT VENTRICLE PLAX 2D LVIDd:         5.20 cm LVIDs:         4.80 cm LV PW:         1.00 cm LV IVS:  1.10 cm  LV Volumes (MOD) LV vol d, MOD A2C: 108.0 ml LV vol d, MOD A4C: 126.0 ml LV vol s, MOD A2C: 83.3 ml LV vol s, MOD A4C: 97.7 ml LV SV MOD A2C:     24.7 ml LV SV MOD A4C:     126.0 ml LV SV MOD BP:       25.7 ml LEFT ATRIUM         Index LA diam:    4.70 cm 2.71 cm/m   AORTA Ao Root diam: 2.90 cm TRICUSPID VALVE TR Peak grad:   30.9 mmHg TR Vmax:        278.00 cm/s Yolonda Kida MD Electronically signed by Yolonda Kida MD Signature Date/Time: 07/27/2022/7:41:23 AM    Final    DG Chest Port 1 View  Result Date: 07/26/2022 CLINICAL DATA:  Acute respiratory failure and hypoxia. EXAM: PORTABLE CHEST 1 VIEW COMPARISON:  07/25/2022 FINDINGS: There is a right arm PICC line with tip in the right atrium. Stable cardiomediastinal contours. Bilateral multifocal airspace opacities are unchanged in the interval. No new findings. IMPRESSION: No change in aeration to the lungs compared with previous exam. Electronically Signed   By: Kerby Moors M.D.   On: 07/26/2022 11:05   DG Chest Port 1 View  Result Date: 07/25/2022 CLINICAL DATA:  Acute respiratory failure EXAM: PORTABLE CHEST 1 VIEW COMPARISON:  07/24/2022 FINDINGS: Cardiac shadow is enlarged but stable. Right PICC is seen deep in the right atrium but stable. Patchy airspace opacities are again identified bilaterally but mildly improved when compared with the prior study. No bony abnormality is noted. IMPRESSION: Slight improved aeration when compared with the previous exam. Electronically Signed   By: Inez Catalina M.D.   On: 07/25/2022 04:10   DG Chest Port 1 View  Result Date: 07/24/2022 CLINICAL DATA:  Acute respiratory failure EXAM: PORTABLE CHEST 1 VIEW COMPARISON:  July 23, 2022 FINDINGS: A right PICC line terminates near the caval atrial junction. The cardiomediastinal silhouette is stable. Bilateral pulmonary opacities, particularly in the perihilar regions, have worsened. Probable skin fold over the lateral right lung with lung markings on both sides. No convincing evidence of pneumothorax. Decreased lung volumes. No other acute abnormalities. IMPRESSION: 1. The linear stripe over the lateral right mid chest is likely a skin fold as there  are lung markings on both sides. No convincing evidence of pneumothorax. Recommend attention on follow-up. 2. Stable right PICC line. 3. Increasing bilateral perihilar opacities. While pulmonary edema is possible, the findings could represent a multifocal infectious process/pneumonia. Recommend clinical correlation and attention on follow-up. Electronically Signed   By: Dorise Bullion III M.D.   On: 07/24/2022 07:52   DG Chest Port 1 View  Result Date: 07/23/2022 CLINICAL DATA:  Hypoxic respiratory failure. EXAM: PORTABLE CHEST 1 VIEW COMPARISON:  Portable chest yesterday at 8:27 a.m. FINDINGS: 4:46 a.m. Right PICC tip remains in the upper right atrium. Moderate cardiomegaly is stable. Today there is increased perihilar vascular congestion and central interstitial edema. There are stable small pleural effusions and overlying consolidation or atelectasis, denser on the left. There is increased perihilar opacity on the right which could be atelectasis, infiltrate or ground-glass edema. The more cephalad lungs remain clear. In all other respects no further changes. There is no pneumothorax.  Aortic atherosclerosis. IMPRESSION: 1. Increased perihilar vascular congestion and mild central interstitial edema. 2. Similar layering pleural effusions and overlying lung opacities. 3. Increased right mid perihilar opacity. Electronically Signed  By: Telford Nab M.D.   On: 07/23/2022 06:21   DG Chest Port 1 View  Result Date: 07/22/2022 CLINICAL DATA:  Status post sigmoid colectomy. EXAM: PORTABLE CHEST 1 VIEW COMPARISON:  July 21, 2022. FINDINGS: Stable cardiomegaly. Interval placement of right-sided PICC line with distal tip in expected position of cavoatrial junction. Mild bibasilar subsegmental atelectasis is noted. Bony thorax is unremarkable. IMPRESSION: Interval placement of right-sided PICC line. Mild bibasilar subsegmental atelectasis is noted with probable minimal pleural effusions. Electronically Signed    By: Marijo Conception M.D.   On: 07/22/2022 08:47   [place] EKG SITE RITE  Result Date: 07/21/2022 If Site Rite image not attached, placement could not be confirmed due to current cardiac rhythm.  DG Abd 1 View  Result Date: 07/21/2022 CLINICAL DATA:  [id number].  Abdominal pain EXAM: ABDOMEN - 1 VIEW COMPARISON:  06/21/2015 FINDINGS: Multiple surgical skin staples overlie the lower abdomen. Normal abdominal gas pattern. No gross free intraperitoneal gas. Cholecystectomy clips are seen in the right upper quadrant. Surgical drain overlies the mid pelvis. No organomegaly. Osseous structures are age-appropriate. IMPRESSION: Normal abdominal gas pattern. Electronically Signed   By: Fidela Salisbury M.D.   On: 07/21/2022 00:36   DG Chest 1 View  Result Date: 07/21/2022 CLINICAL DATA:  Abdominal pain.  [id number] EXAM: CHEST  1 VIEW COMPARISON:  02/23/2021 FINDINGS: Lungs volumes are small, but are symmetric and are clear. No pneumothorax or pleural effusion. Cardiac size within normal limits. Pulmonary vascularity is normal. Osseous structures are age-appropriate. No acute bone abnormality. IMPRESSION: No active disease. Electronically Signed   By: Fidela Salisbury M.D.   On: 07/21/2022 00:35   ECHOCARDIOGRAM COMPLETE  Result Date: 07/20/2022    ECHOCARDIOGRAM REPORT   Patient Name:   ARCENIA SCARBRO Date of Exam: 07/20/2022 Medical Rec #:  [id number]   Height:       63.0 in Accession #:    [id number]  Weight:       122.0 lb Date of Birth:  [birth year]   BSA:          1.567 m Patient Age:    13 years    BP:           105/73 mmHg Patient Gender: F           HR:           126 bpm. Exam Location:  [facility] Procedure: 2D Echo, Color Doppler and Cardiac Doppler Indications:     I21.4 NSTEMI  History:         Patient has no prior history of Echocardiogram examinations.                  Arrythmias:Tachycardia; Risk Factors:Hypertension, Diabetes and                  Dyslipidemia.  Sonographer:     Charmayne Sheer Referring Phys:  [id number] Lengby TANG Diagnosing Phys: Yolonda Kida MD  Sonographer Comments: No subcostal window. IMPRESSIONS  1. Left ventricular ejection fraction, by estimation, is 45 to 50%. The left ventricle has mildly decreased function. The left ventricle demonstrates global hypokinesis. The left ventricular internal cavity size was mildly to moderately dilated. Left ventricular diastolic parameters were normal.  2. Right ventricular systolic function is normal. The right ventricular size is normal.  3. The mitral valve is normal in structure. No evidence of mitral valve regurgitation.  4. The aortic valve is normal in structure. Aortic valve regurgitation is  not visualized. FINDINGS  Left Ventricle: Left ventricular ejection fraction, by estimation, is 45 to 50%. The left ventricle has mildly decreased function. The left ventricle demonstrates global hypokinesis. The left ventricular internal cavity size was mildly to moderately dilated. There is borderline left ventricular hypertrophy. Left ventricular diastolic parameters were normal. Right Ventricle: The right ventricular size is normal. No increase in right ventricular wall thickness. Right ventricular systolic function is normal. Left Atrium: Left atrial size was normal in size. Right Atrium: Right atrial size was normal in size. Pericardium: There is no evidence of pericardial effusion. Mitral Valve: The mitral valve is normal in structure. No evidence of mitral valve regurgitation. Tricuspid Valve: The tricuspid valve is normal in structure. Tricuspid valve regurgitation is trivial. Aortic Valve: The aortic valve is normal in structure. Aortic valve regurgitation is not visualized. Aortic valve mean gradient measures 5.0 mmHg. Aortic valve peak gradient measures 9.0 mmHg. Aortic valve area, by VTI measures 2.19 cm. Pulmonic Valve: The pulmonic valve was normal in structure. Pulmonic valve regurgitation is not visualized. Aorta: The ascending aorta was not well  visualized. IAS/Shunts: No atrial level shunt detected by color flow Doppler.  LEFT VENTRICLE PLAX 2D LVIDd:         5.24 cm   Diastology LVIDs:         3.95 cm   LV e' medial:    4.79 cm/s LV PW:         1.53 cm   LV E/e' medial:  17.3 LV IVS:        0.93 cm   LV e' lateral:   7.51 cm/s LVOT diam:     2.00 cm   LV E/e' lateral: 11.1 LV SV:         51 LV SV Index:   32 LVOT Area:     3.14 cm  RIGHT VENTRICLE RV Basal diam:  2.79 cm RV S prime:     26.90 cm/s LEFT ATRIUM           Index        RIGHT ATRIUM           Index LA diam:      3.80 cm 2.42 cm/m   RA Area:     15.10 cm LA Vol (A2C): 33.8 ml 21.57 ml/m  RA Volume:   38.80 ml  24.76 ml/m  AORTIC VALVE AV Area (Vmax):    1.94 cm AV Area (Vmean):   1.77 cm AV Area (VTI):     2.19 cm AV Vmax:           150.00 cm/s AV Vmean:          110.000 cm/s AV VTI:            0.232 m AV Peak Grad:      9.0 mmHg AV Mean Grad:      5.0 mmHg LVOT Vmax:         92.70 cm/s LVOT Vmean:        61.900 cm/s LVOT VTI:          0.162 m LVOT/AV VTI ratio: 0.70  AORTA Ao Root diam: 3.10 cm MITRAL VALVE MV Area (PHT): 10.25 cm   SHUNTS MV Decel Time: 74 msec     Systemic VTI:  0.16 m MV E velocity: 83.05 cm/s  Systemic Diam: 2.00 cm MV A velocity: 78.65 cm/s MV E/A ratio:  1.06 Dwayne D Callwood MD Electronically signed by Yolonda Kida MD Signature Date/Time: 07/20/2022/5:44:47  PM    Final     Microbiology: Results for orders placed or performed during the hospital encounter of 07/19/22  MRSA Next Gen by PCR, Nasal     Status: None   Collection Time: 07/21/22 11:20 AM   Specimen: Nasal Mucosa; Nasal Swab  Result Value Ref Range Status   MRSA by PCR Next Gen NOT DETECTED NOT DETECTED Final    Comment: (NOTE) The GeneXpert MRSA Assay (FDA approved for NASAL specimens only), is one component of a comprehensive MRSA colonization surveillance program. It is not intended to diagnose MRSA infection nor to guide or monitor treatment for MRSA infections. Test performance  is not FDA approved in patients less than 58 years old. Performed at [facility], [address] [place], [place] [address]   Urine Culture     Status: Abnormal   Collection Time: 07/21/22 11:28 AM   Specimen: Urine, Catheterized  Result Value Ref Range Status   Specimen Description   Final    URINE, CATHETERIZED Performed at [facility], [address] [place], [place] [address]    Special Requests   Final    NONE Performed at [facility], [place], [place] [address]    Culture 40,000 COLONIES/mL YEAST (A)  Final   Report Status 07/23/2022 FINAL  Final  Respiratory (~20 pathogens) panel by PCR     Status: None   Collection Time: 07/24/22 10:32 AM   Specimen: Nasopharyngeal Swab; Respiratory  Result Value Ref Range Status   Adenovirus NOT DETECTED NOT DETECTED Final   Coronavirus 229E NOT DETECTED NOT DETECTED Final    Comment: (NOTE) The Coronavirus on the Respiratory Panel, DOES NOT test for the novel  Coronavirus (2019 nCoV)    Coronavirus HKU1 NOT DETECTED NOT DETECTED Final   Coronavirus NL63 NOT DETECTED NOT DETECTED Final   Coronavirus OC43 NOT DETECTED NOT DETECTED Final   Metapneumovirus NOT DETECTED NOT DETECTED Final   Rhinovirus / Enterovirus NOT DETECTED NOT DETECTED Final   Influenza A NOT DETECTED NOT DETECTED Final   Influenza B NOT DETECTED NOT DETECTED Final   Parainfluenza Virus 1 NOT DETECTED NOT DETECTED Final   Parainfluenza Virus 2 NOT DETECTED NOT DETECTED Final   Parainfluenza Virus 3 NOT DETECTED NOT DETECTED Final   Parainfluenza Virus 4 NOT DETECTED NOT DETECTED Final   Respiratory Syncytial Virus NOT DETECTED NOT DETECTED Final   Bordetella pertussis NOT DETECTED NOT DETECTED Final   Bordetella Parapertussis NOT DETECTED NOT DETECTED Final   Chlamydophila pneumoniae NOT DETECTED NOT DETECTED Final   Mycoplasma pneumoniae NOT DETECTED NOT DETECTED Final    Comment: Performed at [facility], [place] [address], [place], [place] [address]  MRSA Next Gen by PCR, Nasal     Status: None   Collection Time: 07/24/22 10:35 AM   Specimen: Anterior Nasal Swab  Result Value Ref Range Status   MRSA by PCR Next Gen NOT DETECTED NOT DETECTED Final    Comment: (NOTE) The GeneXpert MRSA Assay (FDA approved for NASAL specimens only), is one component of a comprehensive MRSA colonization surveillance program. It is not intended to diagnose MRSA infection nor to guide or monitor treatment for MRSA infections. Test performance is not FDA approved in patients less than 82 years old. Performed at [facility], [address] [place], [place] [address]   SARS Coronavirus 2 by RT PCR (hospital order, performed in [facility] hospital lab) *cepheid single result test* Anterior Nasal  Swab     Status: None   Collection Time: 07/24/22 10:35 AM   Specimen: Anterior Nasal Swab  Result Value Ref Range Status   SARS Coronavirus 2 by RT PCR NEGATIVE NEGATIVE Final    Comment: (NOTE) SARS-CoV-2 target nucleic acids are NOT DETECTED.  The SARS-CoV-2 RNA is generally detectable in upper and lower respiratory specimens during the acute phase of infection. The lowest concentration of SARS-CoV-2 viral copies this assay can detect is 250 copies / mL. A negative result does not preclude SARS-CoV-2 infection and should not be used as the sole basis for treatment or other patient management decisions.  A negative result may occur with improper specimen collection / handling, submission of specimen other than nasopharyngeal swab, presence of viral mutation(s) within the areas targeted by this assay, and inadequate number of viral copies (<250 copies / mL). A negative result must be combined with clinical observations, patient history, and epidemiological information.  Fact Sheet for Patients:   https://www.patel.info/  Fact Sheet for Healthcare  Providers: https://hall.com/  This test is not yet approved or  cleared by the [place] FDA and has been authorized for detection and/or diagnosis of SARS-CoV-2 by FDA under an Emergency Use Authorization (EUA).  This EUA will remain in effect (meaning this test can be used) for the duration of the COVID-19 declaration under Section 564(b)(1) of the Act, 21 U.S.C. section 360bbb-3(b)(1), unless the authorization is terminated or revoked sooner.  Performed at [facility], [address] [place], [place] [address]     Labs: CBC: Recent Labs  Lab 07/28/22 [phone] 07/29/22 1347 07/30/22 0438 07/31/22 0910 08/01/22 0541  WBC 8.1 11.2* 9.7 12.3* 10.2  HGB 9.0* 10.4* 9.5* 11.0* 10.5*  HCT 30.1* 33.1* 31.7* 35.8* 34.1*  MCV 92.9 90.4 92.4 92.0 90.7  PLT 281 337 294 311 861    Basic Metabolic Panel: Recent Labs  Lab 07/28/22 0514 07/29/22 0424 07/29/22 1347 07/30/22 0438 07/31/22 0910 08/01/22 0541  NA 141  --  140 141 138 138  K 3.5  --  3.7 3.5 4.1 3.6  CL 106  --  102 100 97* 92*  CO2 30  --  31 33* 34* 34*  GLUCOSE 153*  --  221* 185* 275* 254*  BUN 40*  --  35* 35* 34* 41*  CREATININE 0.60  --  0.48 0.56 0.54 0.53  CALCIUM 8.0*  --  7.9* 7.7* 7.8* 8.2*  MG 1.9 1.8  --  1.8 2.0 1.9  PHOS 2.5 2.3*  --  3.0 2.9 3.0    Liver Function Tests: Recent Labs  Lab 07/28/22 0514  AST 62*  ALT 36  ALKPHOS 72  BILITOT 0.5  PROT 5.3*  ALBUMIN 2.3*    CBG: Recent Labs  Lab 07/31/22 2117 08/01/22 0033 08/01/22 0545 08/01/22 0804 08/01/22 1231  GLUCAP 171* 274* 221* 230* 267*     Discharge time spent: greater than 30 minutes.  Signed: Annita Brod, MD [facility] 08/01/2022

## 2022-08-01 NOTE — TOC Transition Note (Addendum)
Transition of Care [facility]) - CM/SW Discharge Note   Patient Details  Name: Sarah [name] MRN: [id number] Date of Birth: 1946-05-31  [facility] CM/SW Contact:  Alberteen Sam, LCSW Phone Number: 08/01/2022, 12:53 PM   Clinical Narrative:     [facility] to[facility] Anticipated DC date: 08/01/22 Transport by: [facility]  Per MD patient ready for [facility] to [facility] . RN, patient, patient's family, and facility notified of DC. Discharge Summary sent to facility. RN given number for report  [phone] Room 607B. DC packet on chart. Ambulance transport requested for patient.  CSW signing off.  Pricilla Riffle, LCSW    Final next level of care: Skilled Nursing Facility Barriers to Discharge: No Barriers Identified   Patient Goals and CMS Choice Patient states their goals for this hospitalization and ongoing recovery are:: to go home CMS Medicare.gov Compare Post Acute Care list provided to:: Patient Choice offered to / list presented to : Patient  Discharge Placement              Patient chooses bed at: [facility]     Patient and family notified of of transfer: 08/01/22  Discharge Plan and Services   Discharge Planning Services: CM Consult Post Acute Care Choice: [place]          DME Arranged: N/A                    Social Determinants of Health (SDOH) Interventions     Readmission Risk Interventions    07/28/2022    3:27 PM  Readmission Risk Prevention Plan  Transportation Screening Complete  PCP or Specialist Appt within 3-5 Days Complete  HRI or [place] Complete  Social Work Consult for [place] Planning/Counseling Complete  Palliative Care Screening Complete  Medication Review Press photographer) Complete

## 2022-08-02 LAB — SURGICAL PATHOLOGY

## 2022-08-04 ENCOUNTER — Encounter: Payer: Self-pay | Admitting: Internal Medicine

## 2022-08-11 ENCOUNTER — Emergency Department: Payer: Medicare Other

## 2022-08-11 ENCOUNTER — Inpatient Hospital Stay
Admission: EM | Admit: 2022-08-11 | Discharge: 2022-08-17 | DRG: 862 | Disposition: A | Discharge status: Skilled Nursing Facility | Payer: Medicare Other | Source: Skilled Nursing Facility | Attending: Surgery | Admitting: Surgery

## 2022-08-11 DIAGNOSIS — E11649 Type 2 diabetes mellitus with hypoglycemia without coma: Secondary | ICD-10-CM | POA: Diagnosis present

## 2022-08-11 DIAGNOSIS — Z66 Do not resuscitate: Secondary | ICD-10-CM | POA: Diagnosis present

## 2022-08-11 DIAGNOSIS — E039 Hypothyroidism, unspecified: Secondary | ICD-10-CM | POA: Diagnosis present

## 2022-08-11 DIAGNOSIS — G9341 Metabolic encephalopathy: Secondary | ICD-10-CM | POA: Diagnosis present

## 2022-08-11 DIAGNOSIS — I959 Hypotension, unspecified: Secondary | ICD-10-CM | POA: Diagnosis not present

## 2022-08-11 DIAGNOSIS — E861 Hypovolemia: Secondary | ICD-10-CM | POA: Diagnosis present

## 2022-08-11 DIAGNOSIS — I214 Non-ST elevation (NSTEMI) myocardial infarction: Secondary | ICD-10-CM | POA: Diagnosis present

## 2022-08-11 DIAGNOSIS — Z7952 Long term (current) use of systemic steroids: Secondary | ICD-10-CM

## 2022-08-11 DIAGNOSIS — Z933 Colostomy status: Secondary | ICD-10-CM

## 2022-08-11 DIAGNOSIS — Z90710 Acquired absence of both cervix and uterus: Secondary | ICD-10-CM

## 2022-08-11 DIAGNOSIS — E43 Unspecified severe protein-calorie malnutrition: Secondary | ICD-10-CM | POA: Diagnosis present

## 2022-08-11 DIAGNOSIS — N179 Acute kidney failure, unspecified: Secondary | ICD-10-CM | POA: Diagnosis present

## 2022-08-11 DIAGNOSIS — Z961 Presence of intraocular lens: Secondary | ICD-10-CM | POA: Diagnosis present

## 2022-08-11 DIAGNOSIS — I2510 Atherosclerotic heart disease of native coronary artery without angina pectoris: Secondary | ICD-10-CM | POA: Diagnosis present

## 2022-08-11 DIAGNOSIS — Z923 Personal history of irradiation: Secondary | ICD-10-CM

## 2022-08-11 DIAGNOSIS — Z9842 Cataract extraction status, left eye: Secondary | ICD-10-CM

## 2022-08-11 DIAGNOSIS — K589 Irritable bowel syndrome without diarrhea: Secondary | ICD-10-CM | POA: Diagnosis present

## 2022-08-11 DIAGNOSIS — I110 Hypertensive heart disease with heart failure: Secondary | ICD-10-CM | POA: Diagnosis present

## 2022-08-11 DIAGNOSIS — K625 Hemorrhage of anus and rectum: Secondary | ICD-10-CM | POA: Diagnosis present

## 2022-08-11 DIAGNOSIS — C19 Malignant neoplasm of rectosigmoid junction: Secondary | ICD-10-CM | POA: Diagnosis present

## 2022-08-11 DIAGNOSIS — I480 Paroxysmal atrial fibrillation: Secondary | ICD-10-CM | POA: Diagnosis present

## 2022-08-11 DIAGNOSIS — E162 Hypoglycemia, unspecified: Secondary | ICD-10-CM | POA: Diagnosis not present

## 2022-08-11 DIAGNOSIS — Z7982 Long term (current) use of aspirin: Secondary | ICD-10-CM

## 2022-08-11 DIAGNOSIS — K651 Peritoneal abscess: Secondary | ICD-10-CM | POA: Diagnosis present

## 2022-08-11 DIAGNOSIS — I5022 Chronic systolic (congestive) heart failure: Secondary | ICD-10-CM | POA: Diagnosis not present

## 2022-08-11 DIAGNOSIS — Z6821 Body mass index (BMI) 21.0-21.9, adult: Secondary | ICD-10-CM

## 2022-08-11 DIAGNOSIS — I10 Essential (primary) hypertension: Secondary | ICD-10-CM | POA: Diagnosis not present

## 2022-08-11 DIAGNOSIS — E785 Hyperlipidemia, unspecified: Secondary | ICD-10-CM | POA: Diagnosis present

## 2022-08-11 DIAGNOSIS — D696 Thrombocytopenia, unspecified: Secondary | ICD-10-CM | POA: Diagnosis present

## 2022-08-11 DIAGNOSIS — Z79899 Other long term (current) drug therapy: Secondary | ICD-10-CM

## 2022-08-11 DIAGNOSIS — L89152 Pressure ulcer of sacral region, stage 2: Secondary | ICD-10-CM | POA: Diagnosis present

## 2022-08-11 DIAGNOSIS — Z79890 Hormone replacement therapy (postmenopausal): Secondary | ICD-10-CM

## 2022-08-11 DIAGNOSIS — B965 Pseudomonas (aeruginosa) (mallei) (pseudomallei) as the cause of diseases classified elsewhere: Secondary | ICD-10-CM | POA: Diagnosis present

## 2022-08-11 DIAGNOSIS — Z882 Allergy status to sulfonamides status: Secondary | ICD-10-CM

## 2022-08-11 DIAGNOSIS — E1165 Type 2 diabetes mellitus with hyperglycemia: Secondary | ICD-10-CM | POA: Diagnosis present

## 2022-08-11 DIAGNOSIS — T8143XA Infection following a procedure, organ and space surgical site, initial encounter: Principal | ICD-10-CM | POA: Diagnosis present

## 2022-08-11 DIAGNOSIS — Z9841 Cataract extraction status, right eye: Secondary | ICD-10-CM

## 2022-08-11 DIAGNOSIS — L8990 Pressure ulcer of unspecified site, unspecified stage: Secondary | ICD-10-CM | POA: Diagnosis present

## 2022-08-11 DIAGNOSIS — C187 Malignant neoplasm of sigmoid colon: Secondary | ICD-10-CM | POA: Diagnosis not present

## 2022-08-11 DIAGNOSIS — E871 Hypo-osmolality and hyponatremia: Secondary | ICD-10-CM | POA: Diagnosis not present

## 2022-08-11 DIAGNOSIS — Z7984 Long term (current) use of oral hypoglycemic drugs: Secondary | ICD-10-CM

## 2022-08-11 DIAGNOSIS — Z880 Allergy status to penicillin: Secondary | ICD-10-CM

## 2022-08-11 DIAGNOSIS — Y838 Other surgical procedures as the cause of abnormal reaction of the patient, or of later complication, without mention of misadventure at the time of the procedure: Secondary | ICD-10-CM | POA: Diagnosis present

## 2022-08-11 DIAGNOSIS — B952 Enterococcus as the cause of diseases classified elsewhere: Secondary | ICD-10-CM | POA: Diagnosis present

## 2022-08-11 DIAGNOSIS — M19041 Primary osteoarthritis, right hand: Secondary | ICD-10-CM | POA: Diagnosis present

## 2022-08-11 DIAGNOSIS — M19042 Primary osteoarthritis, left hand: Secondary | ICD-10-CM | POA: Diagnosis present

## 2022-08-11 DIAGNOSIS — Z9049 Acquired absence of other specified parts of digestive tract: Secondary | ICD-10-CM

## 2022-08-11 DIAGNOSIS — F32A Depression, unspecified: Secondary | ICD-10-CM | POA: Diagnosis present

## 2022-08-11 LAB — COMPREHENSIVE METABOLIC PANEL
ALT: 26 U/L (ref 0–44)
AST: 30 U/L (ref 15–41)
Albumin: 2.2 g/dL — ABNORMAL LOW (ref 3.5–5.0)
Alkaline Phosphatase: 67 U/L (ref 38–126)
Anion gap: 11 (ref 5–15)
BUN: 47 mg/dL — ABNORMAL HIGH (ref 8–23)
CO2: 28 mmol/L (ref 22–32)
Calcium: 8.4 mg/dL — ABNORMAL LOW (ref 8.9–10.3)
Chloride: 89 mmol/L — ABNORMAL LOW (ref 98–111)
Creatinine, Ser: 1.54 mg/dL — ABNORMAL HIGH (ref 0.44–1.00)
GFR, Estimated: 35 mL/min — ABNORMAL LOW (ref >60)
Glucose, Bld: 398 mg/dL — ABNORMAL HIGH (ref 70–99)
Potassium: 4.5 mmol/L (ref 3.5–5.1)
Sodium: 128 mmol/L — ABNORMAL LOW (ref 135–145)
Total Bilirubin: 0.8 mg/dL (ref 0.3–1.2)
Total Protein: 5.9 g/dL — ABNORMAL LOW (ref 6.5–8.1)

## 2022-08-11 LAB — CBC WITH DIFFERENTIAL/PLATELET
Abs Immature Granulocytes: 0.02 10*3/uL (ref 0.00–0.07)
Basophils Absolute: 0 10*3/uL (ref 0.0–0.1)
Basophils Relative: 0 %
Eosinophils Absolute: 0 10*3/uL (ref 0.0–0.5)
Eosinophils Relative: 0 %
HCT: 29.5 % — ABNORMAL LOW (ref 36.0–46.0)
Hemoglobin: 9.5 g/dL — ABNORMAL LOW (ref 12.0–15.0)
Immature Granulocytes: 0 %
Lymphocytes Relative: 3 %
Lymphs Abs: 0.2 10*3/uL — ABNORMAL LOW (ref 0.7–4.0)
MCH: 29.2 pg (ref 26.0–34.0)
MCHC: 32.2 g/dL (ref 30.0–36.0)
MCV: 90.8 fL (ref 80.0–100.0)
Monocytes Absolute: 0.1 10*3/uL (ref 0.1–1.0)
Monocytes Relative: 2 %
Neutro Abs: 5.7 10*3/uL (ref 1.7–7.7)
Neutrophils Relative %: 95 %
Platelets: 148 10*3/uL — ABNORMAL LOW (ref 150–400)
RBC: 3.25 MIL/uL — ABNORMAL LOW (ref 3.87–5.11)
RDW: 18.6 % — ABNORMAL HIGH (ref 11.5–15.5)
WBC: 6.1 10*3/uL (ref 4.0–10.5)
nRBC: 0 % (ref 0.0–0.2)

## 2022-08-11 LAB — GLUCOSE, CAPILLARY: Glucose-Capillary: 349 mg/dL — ABNORMAL HIGH (ref 70–99)

## 2022-08-11 LAB — URINALYSIS, ROUTINE W REFLEX MICROSCOPIC
Bacteria, UA: NONE SEEN
Bilirubin Urine: NEGATIVE
Glucose, UA: >=500 mg/dL — AB
Ketones, ur: NEGATIVE mg/dL
Nitrite: NEGATIVE
Protein, ur: 100 mg/dL — AB
RBC / HPF: >50 RBC/hpf — ABNORMAL HIGH (ref 0–5)
Specific Gravity, Urine: 1.015 (ref 1.005–1.030)
WBC, UA: >50 WBC/hpf — ABNORMAL HIGH (ref 0–5)
pH: 6 (ref 5.0–8.0)

## 2022-08-11 LAB — TYPE AND SCREEN
ABO/RH(D): A POS
Antibody Screen: NEGATIVE

## 2022-08-11 LAB — PROTIME-INR
INR: 1.2 (ref 0.8–1.2)
Prothrombin Time: 14.8 seconds (ref 11.4–15.2)

## 2022-08-11 MED ORDER — ONDANSETRON 4 MG PO TBDP: 4.0000 mg | ORAL_TABLET | Freq: Four times a day (QID) | ORAL | Status: DC | PRN

## 2022-08-11 MED ORDER — SERTRALINE HCL 50 MG PO TABS
100.0000 mg | ORAL_TABLET | Freq: Every day | ORAL | Status: DC
  Administered 2022-08-13 – 2022-08-17 (×5): 100 mg via ORAL
  Filled 2022-08-11 (×5): qty 2

## 2022-08-11 MED ORDER — SODIUM CHLORIDE 0.9 % IV SOLN: INTRAVENOUS | Status: DC

## 2022-08-11 MED ORDER — INSULIN ASPART 100 UNIT/ML IJ SOLN: 0.0000 [IU] | INTRAMUSCULAR | Status: DC

## 2022-08-11 MED ORDER — ACETAMINOPHEN 325 MG PO TABS
650.0000 mg | ORAL_TABLET | ORAL | Status: DC | PRN
Start: 2022-08-11 — End: 2022-08-17

## 2022-08-11 MED ORDER — ATORVASTATIN CALCIUM 20 MG PO TABS
40.0000 mg | ORAL_TABLET | Freq: Every day | ORAL | Status: DC
  Administered 2022-08-11 – 2022-08-16 (×5): 40 mg via ORAL
  Filled 2022-08-11 (×5): qty 2

## 2022-08-11 MED ORDER — ALBUTEROL SULFATE (2.5 MG/3ML) 0.083% IN NEBU: 2.5000 mg | INHALATION_SOLUTION | Freq: Four times a day (QID) | RESPIRATORY_TRACT | Status: DC | PRN

## 2022-08-11 MED ORDER — SODIUM CHLORIDE 0.9 % IV BOLUS
1000.0000 mL | Freq: Once | INTRAVENOUS | Status: AC
  Administered 2022-08-11: 1000 mL via INTRAVENOUS

## 2022-08-11 MED ORDER — METOPROLOL TARTRATE 25 MG PO TABS
25.0000 mg | ORAL_TABLET | Freq: Three times a day (TID) | ORAL | Status: DC
  Administered 2022-08-11 – 2022-08-17 (×14): 25 mg via ORAL
  Filled 2022-08-11 (×14): qty 1

## 2022-08-11 MED ORDER — TRAMADOL HCL 50 MG PO TABS
50.0000 mg | ORAL_TABLET | Freq: Four times a day (QID) | ORAL | Status: DC | PRN
  Administered 2022-08-14: 50 mg via ORAL
  Filled 2022-08-11: qty 1

## 2022-08-11 MED ORDER — GABAPENTIN 100 MG PO CAPS
200.0000 mg | ORAL_CAPSULE | Freq: Two times a day (BID) | ORAL | Status: DC
  Administered 2022-08-11: 200 mg via ORAL
  Filled 2022-08-11: qty 2

## 2022-08-11 MED ORDER — DAKINS (1/4 STRENGTH) 0.125 % EX SOLN
Freq: Every day | CUTANEOUS | Status: DC
  Filled 2022-08-11 (×2): qty 473

## 2022-08-11 MED ORDER — ENSURE ENLIVE PO LIQD
237.0000 mL | Freq: Three times a day (TID) | ORAL | Status: DC
  Administered 2022-08-11 – 2022-08-16 (×10): 237 mL via ORAL

## 2022-08-11 MED ORDER — CIPROFLOXACIN IN D5W 400 MG/200ML IV SOLN
400.0000 mg | INTRAVENOUS | Status: DC
  Administered 2022-08-11: 400 mg via INTRAVENOUS
  Filled 2022-08-11: qty 200

## 2022-08-11 MED ORDER — IPRATROPIUM BROMIDE 0.02 % IN SOLN
0.5000 mg | Freq: Two times a day (BID) | RESPIRATORY_TRACT | Status: DC
Start: 2022-08-11 — End: 2022-08-17
  Administered 2022-08-11 – 2022-08-16 (×11): 0.5 mg via RESPIRATORY_TRACT
  Filled 2022-08-11 (×11): qty 2.5

## 2022-08-11 MED ORDER — INSULIN ASPART 100 UNIT/ML IJ SOLN
0.0000 [IU] | Freq: Three times a day (TID) | INTRAMUSCULAR | Status: DC
  Administered 2022-08-11: 7 [IU] via SUBCUTANEOUS
  Filled 2022-08-11: qty 1

## 2022-08-11 MED ORDER — ONDANSETRON HCL 4 MG/2ML IJ SOLN: 4.0000 mg | Freq: Four times a day (QID) | INTRAMUSCULAR | Status: DC | PRN

## 2022-08-11 MED ORDER — VENLAFAXINE HCL 37.5 MG PO TABS
37.5000 mg | ORAL_TABLET | Freq: Every day | ORAL | Status: DC
  Administered 2022-08-13 – 2022-08-17 (×5): 37.5 mg via ORAL
  Filled 2022-08-11 (×6): qty 1

## 2022-08-11 MED ORDER — IOHEXOL 350 MG/ML SOLN
75.0000 mL | Freq: Once | INTRAVENOUS | Status: AC | PRN
  Administered 2022-08-11: 75 mL via INTRAVENOUS

## 2022-08-11 MED ORDER — LEVOTHYROXINE SODIUM 50 MCG PO TABS
125.0000 ug | ORAL_TABLET | Freq: Every day | ORAL | Status: DC
  Administered 2022-08-13 – 2022-08-17 (×5): 125 ug via ORAL
  Filled 2022-08-11 (×5): qty 1

## 2022-08-11 MED ORDER — LISINOPRIL 5 MG PO TABS: 2.5000 mg | ORAL_TABLET | Freq: Every day | ORAL | Status: DC

## 2022-08-11 MED ORDER — AMIODARONE HCL 200 MG PO TABS
200.0000 mg | ORAL_TABLET | Freq: Every day | ORAL | Status: DC
  Administered 2022-08-13 – 2022-08-17 (×5): 200 mg via ORAL
  Filled 2022-08-11 (×6): qty 1

## 2022-08-11 MED ORDER — IPRATROPIUM-ALBUTEROL 0.5-2.5 (3) MG/3ML IN SOLN: 3.0000 mL | Freq: Four times a day (QID) | RESPIRATORY_TRACT | Status: DC | PRN

## 2022-08-11 MED ORDER — FUROSEMIDE 40 MG PO TABS: 40.0000 mg | ORAL_TABLET | Freq: Every day | ORAL | Status: DC

## 2022-08-11 MED ORDER — SPIRONOLACTONE 25 MG PO TABS: 12.5000 mg | ORAL_TABLET | Freq: Every day | ORAL | Status: DC

## 2022-08-11 MED ORDER — OXYCODONE-ACETAMINOPHEN 5-325 MG PO TABS
1.0000 | ORAL_TABLET | Freq: Four times a day (QID) | ORAL | Status: DC | PRN
  Administered 2022-08-14 – 2022-08-16 (×2): 1 via ORAL
  Filled 2022-08-11 (×2): qty 1

## 2022-08-11 MED ORDER — INSULIN ASPART 100 UNIT/ML IJ SOLN: 0.0000 [IU] | Freq: Three times a day (TID) | INTRAMUSCULAR | Status: DC

## 2022-08-11 MED ORDER — ONDANSETRON 4 MG PO TBDP: 4.0000 mg | ORAL_TABLET | Freq: Three times a day (TID) | ORAL | Status: DC | PRN

## 2022-08-11 MED ORDER — SODIUM CHLORIDE 0.9 % IV SOLN: INTRAVENOUS | Status: AC

## 2022-08-11 MED ORDER — MORPHINE SULFATE (PF) 2 MG/ML IV SOLN: 2.0000 mg | INTRAVENOUS | Status: DC | PRN

## 2022-08-11 MED ORDER — METRONIDAZOLE 500 MG/100ML IV SOLN
500.0000 mg | Freq: Two times a day (BID) | INTRAVENOUS | Status: DC
  Administered 2022-08-12 (×2): 500 mg via INTRAVENOUS
  Filled 2022-08-11 (×2): qty 100

## 2022-08-11 MED ORDER — SIMVASTATIN 10 MG PO TABS: 20.0000 mg | ORAL_TABLET | Freq: Every day | ORAL | Status: DC

## 2022-08-11 NOTE — Assessment & Plan Note (Signed)
Continue amiodarone and metoprolol for rate control Patient not on long-term anticoagulation due to GI bleed

## 2022-08-11 NOTE — Assessment & Plan Note (Signed)
Patient noted to have severe protein calorie malnutrition related to acute illness as evidenced by severe fat and muscle depletion Continue Glucerna shakes 3 times daily

## 2022-08-11 NOTE — Assessment & Plan Note (Signed)
Stage II sacral ulcer present on admission We will request wound care consult DuoDERM every 72 hours

## 2022-08-11 NOTE — Assessment & Plan Note (Signed)
Continue Synthroid °

## 2022-08-11 NOTE — ED Provider Notes (Signed)
[facility] Provider Note    Event Date/Time   First MD Initiated Contact with Patient 08/11/22 1506     (approximate)  History   Chief Complaint: GI bleed  HPI  Sarah [name] is a 76 y.o. female with a past medical history of arthritis, diabetes, hypertension, hyperlipidemia presents to the emergency department for rectal bleeding.  [facility] for bright red blood per rectum.  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  Patient had a complication of a wound infection now has an open wound to the abdomen currently packed with gauze.  Patient follows up with Dr. Lysle Pearl.  Today patient noted bright red blood per rectum.  Has not noted any blood in the colostomy bag.  No nausea vomiting denies abdominal pain although does have some mild tenderness to lower abdomen however she also has an open wound/incision to lower abdomen.  Physical Exam   Triage Vital Signs: ED Triage Vitals  Enc Vitals Group     BP 08/11/22 1446 117/72     Pulse Rate 08/11/22 1448 70     Resp --      Temp 08/11/22 1456 97.9 F (36.6 C)     Temp Source 08/11/22 1456 Oral     SpO2 08/11/22 1448 96 %     Weight 08/11/22 1502 122 lb 12.8 oz (55.7 kg)     Height 08/11/22 1502 '5\' 3"'$  (1.6 m)     Head Circumference --      [facility] Flow --      Pain Score 08/11/22 1502 0     Pain Loc --      Pain Edu? --      Excl. in [place]? --     Most recent vital signs: Vitals:   08/11/22 1448 08/11/22 1456  BP:    Pulse: 70   Temp:  97.9 F (36.6 C)  SpO2: 96%     General: Awake, no distress.  CV:  Good peripheral perfusion.  Regular rate and rhythm  Resp:  Normal effort.  Equal breath sounds bilaterally.  Abd:  No distention.  Soft, nontender.  No rebound or guarding.  Colostomy present.  Colostomy stools guaiac negative.  Open incision to abdomen currently packed with gauze no obvious signs of  infection. Other:  Rectal exam shows gross hematochezia, strongly guaiac positive.   ED Results / Procedures / Treatments   EKG  EKG viewed and interpreted by myself shows a normal sinus rhythm at 70 bpm with a slightly widened QRS, left axis deviation, slight QTc prolongation otherwise normal intervals with nonspecific ST changes.  RADIOLOGY  I reviewed and interpreted the patient's CT images.  I do not see any sign of any large bowel obstruction. Radiology is read the CT scan is showing 2 areas concerning for intra-abdominal abscess.   MEDICATIONS ORDERED IN ED: Medications  sodium chloride 0.9 % bolus 1,000 mL (has no administration in time range)  iohexol (OMNIPAQUE) 350 MG/ML injection 75 mL (has no administration in time range)     IMPRESSION / MDM / ASSESSMENT AND PLAN / ED COURSE  I reviewed the triage vital signs and the nursing notes.  Patient's presentation is most consistent with acute presentation with potential threat to life or bodily function.  Patient presents emergency department for rectal bleeding.  Patient has recent colostomy and has a open abdominal incision from her laparotomy currently packed with gauze.  I reviewed the patient's records she was admitted 07/19/2022 discharge 08/01/2022, admitted for a scheduled laparoscopic sigmoidectomy, developed atrial fibrillation ultimately requiring ICU admission.  Per note patient had a sigmoidectomy with Hartman's pouch.  Given the patient's gross hematochezia we will obtain a CT angio of the abdomen/pelvis GI bleed protocol.  We will check labs including H&H as well as a type and screen and INR.  Patient is scheduled to start Eliquis 2 weeks after her discharge, however does not appear the patient has yet started Eliquis.  We will discuss with general surgery.  Patient agreeable to plan.  Currently patient appears well, mentating well, reassuring vitals.  Patient's labs have resulted in a hemoglobin of 9.5 approximate  one-point down from prior, reassuringly normal white blood cell count.  Chemistry shows no significant abnormality mild hyperglycemia, mild renal insufficiency.  Patient CTA of the abdomen shows 2 areas concerning for intra-abdominal abscess possible bowel leak.  Given the patient's GI bleed I spoke with Dr. Lysle Pearl who has reviewed the CT images.  I also spoke with the hospitalist Dr. Francine Graven.  They will discuss among themselves to see who will be admitting and who will be consulting however both services will be involved.  Discussed these findings with the patient.  FINAL CLINICAL IMPRESSION(S) / ED DIAGNOSES   GI bleed Postoperative abscess    Note:  This document was prepared using Dragon voice recognition software and may include unintentional dictation errors.   Harvest Dark, MD 08/11/22 [phone]

## 2022-08-11 NOTE — Progress Notes (Signed)
PHARMACY NOTE:  ANTIMICROBIAL RENAL DOSAGE ADJUSTMENT  Current antimicrobial regimen includes a mismatch between antimicrobial dosage and estimated renal function.  As per policy approved by the Pharmacy & Therapeutics and Medical Executive Committees, the antimicrobial dosage will be adjusted accordingly.  Current antimicrobial dosage:   Cipro '400mg'$  V q12h  Indication: IAI  Renal Function:  Estimated Creatinine Clearance: 25.7 mL/min (A) (by C-G formula based on SCr of 1.54 mg/dL (H)).      Antimicrobial dosage has been changed to:  Cipro '400mg'$  IV q24h  Additional comments:   Thank you for allowing pharmacy to be a part of this patient's care.  [place], [facility] 08/11/2022 6:57 PM

## 2022-08-11 NOTE — ED Notes (Signed)
This RN attempted to return the patients sister Judson Roch) phone call without success.

## 2022-08-11 NOTE — Assessment & Plan Note (Addendum)
Patient presents for evaluation of bright red blood per rectum CT angiogram does not show any evidence of active GI bleed Hemoglobin is stable at 9.5 with a 1 g drop from her last hospitalization Hold aspirin Check serial H&H

## 2022-08-11 NOTE — H&P (Addendum)
Subjective:   CC: lower GI bleed and discharge  HPI:  Sarah [name] is a 75 y.o. female who was consulted by [facility] for issue above.  Symptoms were first noted 1 day ago. Pt with no specific complaints at time of exam.   Past Medical History:  has a past medical history of Arthritis, Cancer ([place]), Diabetes mellitus without complication ([place]), Hyperlipidemia, Hypertension, Hypothyroidism, and IBS (irritable bowel syndrome).  Past Surgical History:  Past Surgical History:  Procedure Laterality Date   ABDOMINAL HYSTERECTOMY     CATARACT EXTRACTION W/PHACO Left 12/21/2016   Procedure: CATARACT EXTRACTION PHACO AND INTRAOCULAR LENS PLACEMENT ([place])  left eye;  Surgeon: Leandrew Koyanagi, MD;  Location: [place];  Service: Ophthalmology;  Laterality: Left;  Diabetic - oral meds   CATARACT EXTRACTION W/PHACO Right 01/25/2017   Procedure: CATARACT EXTRACTION PHACO AND INTRAOCULAR LENS PLACEMENT ([place])  right diabetic;  Surgeon: Leandrew Koyanagi, MD;  Location: [place];  Service: Ophthalmology;  Laterality: Right;  diabetic - oral meds   CHOLECYSTECTOMY     COLONOSCOPY WITH PROPOFOL N/A 10/26/2016   Procedure: COLONOSCOPY WITH PROPOFOL;  Surgeon: Manya Silvas, MD;  Location: [facility] ENDOSCOPY;  Service: Endoscopy;  Laterality: N/A;   IR RADIOLOGIST EVAL & MGMT  06/14/2022   KNEE SURGERY Right    LEFT HEART CATH AND CORONARY ANGIOGRAPHY N/A 07/29/2022   Procedure: LEFT HEART CATH AND CORONARY ANGIOGRAPHY;  Surgeon: Yolonda Kida, MD;  Location: [place] CV [facility];  Service: Cardiovascular;  Laterality: N/A;    Family History: not relevant to CC  Social History:  reports that she has never smoked. She has never used smokeless tobacco. She reports that she does not drink alcohol and does not use drugs.  Current Medications:  Prior to Admission medications   Medication Sig Start Date End Date Taking? Authorizing Provider  amiodarone (PACERONE) 200 MG tablet Take  1 tablet (200 mg total) by mouth daily. 08/02/22  Yes Annita Brod, MD  ascorbic acid (VITAMIN C) 500 MG tablet Take 1 tablet (500 mg total) by mouth 2 (two) times daily. 08/01/22  Yes Annita Brod, MD  aspirin 81 MG chewable tablet Chew 1 tablet (81 mg total) by mouth daily. 08/02/22  Yes Annita Brod, MD  atorvastatin (LIPITOR) 40 MG tablet Take 1 tablet (40 mg total) by mouth at bedtime. 08/01/22  Yes Annita Brod, MD  cyanocobalamin 1000 MCG tablet Take 1,000 mcg by mouth daily.   Yes [provider]  furosemide (LASIX) 40 MG tablet Take 40 mg by mouth daily.   Yes [provider]  gabapentin (NEURONTIN) 100 MG capsule Take 2 capsules (200 mg total) by mouth 2 (two) times daily. 08/01/22  Yes Annita Brod, MD  glimepiride (AMARYL) 4 MG tablet Take 4 mg by mouth daily with breakfast. 09/29/21 09/29/22 Yes [provider]  ipratropium (ATROVENT) 0.02 % nebulizer solution Take 2.5 mLs (0.5 mg total) by nebulization 2 (two) times daily. 08/01/22  Yes Annita Brod, MD  levalbuterol Penne Lash) 0.63 MG/3ML nebulizer solution Take 3 mLs (0.63 mg total) by nebulization 2 (two) times daily. 08/01/22  Yes Annita Brod, MD  levothyroxine (SYNTHROID) 125 MCG tablet Take 1 tablet (125 mcg total) by mouth daily before breakfast. 08/01/22  Yes Annita Brod, MD  lisinopril (ZESTRIL) 2.5 MG tablet Take 1 tablet (2.5 mg total) by mouth daily. 08/02/22  Yes Annita Brod, MD  magnesium oxide (MAG-OX) 400 (240 Mg) MG tablet  Take 400 mg by mouth daily.   Yes [provider]  metoprolol tartrate (LOPRESSOR) 25 MG tablet Take 1 tablet (25 mg total) by mouth 3 (three) times daily. 08/01/22  Yes Annita Brod, MD  Multiple Vitamin (MULTIVITAMIN WITH MINERALS) TABS tablet Take 1 tablet by mouth daily. 08/02/22  Yes Annita Brod, MD  potassium chloride SA (KLOR-CON M) 20 MEQ tablet Take 20 mEq by mouth 2 (two) times daily.   Yes [provider]  predniSONE (DELTASONE) 10 MG tablet Take 10 mg by mouth daily with breakfast.   Yes [provider]  sertraline (ZOLOFT) 100 MG tablet Take 100 mg by mouth daily.   Yes [provider]  spironolactone (ALDACTONE) 25 MG tablet Take 0.5 tablets (12.5 mg total) by mouth daily. 08/02/22  Yes Annita Brod, MD  venlafaxine (EFFEXOR) 37.5 MG tablet Take 37.5 mg by mouth daily.   Yes [provider]  zinc sulfate 220 (50 Zn) MG capsule Take 1 capsule (220 mg total) by mouth daily. 08/02/22  Yes Annita Brod, MD  acetaminophen (TYLENOL) 325 MG tablet Take 2 tablets (650 mg total) by mouth every 4 (four) hours as needed for headache or mild pain. 08/01/22   Annita Brod, MD  feeding supplement (ENSURE ENLIVE / ENSURE PLUS) LIQD Take 237 mLs by mouth 3 (three) times daily between meals. 08/01/22   Annita Brod, MD  ipratropium-albuterol (DUONEB) 0.5-2.5 (3) MG/3ML SOLN Take 3 mLs by nebulization every 6 (six) hours as needed. 08/01/22   Annita Brod, MD  ondansetron (ZOFRAN-ODT) 4 MG disintegrating tablet Take 1 tablet (4 mg total) by mouth every 8 (eight) hours as needed for nausea or vomiting. 06/05/22   Arta Silence, MD  oxyCODONE-acetaminophen (PERCOCET/ROXICET) 5-325 MG tablet Take 1 tablet by mouth every 6 (six) hours as needed for moderate pain. 08/01/22   Annita Brod, MD  simvastatin (ZOCOR) 20 MG tablet Take 20 mg by mouth daily. Patient not taking: Reported on 08/11/2022    [provider]  sodium chloride flush (NS) 0.9 % SOLN 5 mLs by Intracatheter route every 8 (eight) hours. 05/30/22 08/28/22  Benjamine Sprague, DO    Allergies:  Allergies as of 08/11/2022 - Review Complete 08/11/2022  Allergen Reaction Noted   Penicillins Anaphylaxis 06/21/2015   Sulfa antibiotics Rash 10/25/2016      Objective:     BP 124/71   Pulse 76   Temp 97.9 F (36.6 C) (Oral)   Resp 17   Ht '5\' 3"'$  (1.6 m)   Wt 55.7 kg   SpO2 98%   BMI 21.75  kg/m   Constitutional :  cooperative, appears stated age, and fatigued  Lymphatics/Throat:  no asymmetry, masses, or scars  Respiratory:  clear to auscultation bilaterally  Cardiovascular:  regular rate and rhythm  Gastrointestinal: Soft, no guarding, midline wound with blue-green discharge, ostomy patent and intact .   Musculoskeletal: Steady movement  Skin: Cool and moist   Psychiatric: Normal affect, non-agitated, not confused       LABS:     Latest Ref Rng & Units 08/11/2022    3:05 PM 08/01/2022    5:41 AM 07/31/2022    9:10 AM  CMP  Glucose 70 - 99 mg/dL 398  254  275   BUN 8 - 23 mg/dL 47  41  34   Creatinine 0.44 - 1.00 mg/dL 1.54  0.53  0.54   Sodium 135 - 145 mmol/L 128  138  138   Potassium 3.5 - 5.1 mmol/L 4.5  3.6  4.1   Chloride 98 - 111 mmol/L 89  92  97   CO2 22 - 32 mmol/L 28  34  34   Calcium 8.9 - 10.3 mg/dL 8.4  8.2  7.8   Total Protein 6.5 - 8.1 g/dL 5.9     Total Bilirubin 0.3 - 1.2 mg/dL 0.8     Alkaline Phos 38 - 126 U/L 67     AST 15 - 41 U/L 30     ALT 0 - 44 U/L 26         Latest Ref Rng & Units 08/11/2022    3:05 PM 08/01/2022    5:41 AM 07/31/2022    9:10 AM  CBC  WBC 4.0 - 10.5 K/uL 6.1  10.2  12.3   Hemoglobin 12.0 - 15.0 g/dL 9.5  10.5  11.0   Hematocrit 36.0 - 46.0 % 29.5  34.1  35.8   Platelets 150 - 400 K/uL 148  267  311     RADS: CLINICAL DATA:  Lower GI bleeding. Bright red blood per rectum. Colostomy.   EXAM: CTA ABDOMEN AND PELVIS WITHOUT AND WITH CONTRAST   TECHNIQUE: Multidetector CT imaging of the abdomen and pelvis was performed using the standard protocol during bolus administration of intravenous contrast. Multiplanar reconstructed images and MIPs were obtained and reviewed to evaluate the vascular anatomy.   RADIATION DOSE REDUCTION: This exam was performed according to the departmental dose-optimization program which includes automated exposure control, adjustment of the mA and/or kV according to patient size and/or  use of iterative reconstruction technique.   CONTRAST:  39m OMNIPAQUE IOHEXOL 350 MG/ML SOLN   COMPARISON:  CT abdomen and pelvis 06/14/2022   FINDINGS: VASCULAR   Aorta: Normal caliber aorta without aneurysm, dissection, vasculitis or significant stenosis. Moderate calcified atherosclerotic disease seen throughout the aorta.   Celiac: Patent without evidence of aneurysm, dissection, vasculitis or significant stenosis.   SMA: Patent without evidence of aneurysm, dissection, vasculitis or significant stenosis.   Renals: Both renal arteries are patent without evidence of aneurysm, dissection, vasculitis, fibromuscular dysplasia or significant stenosis. Accessory left renal artery present.   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 Artery is otherwise widely patent.   Inflow: Patent without evidence of aneurysm, dissection, vasculitis or significant stenosis.   Proximal Outflow: Bilateral common femoral and visualized portions of the superficial and profunda femoral arteries are patent without evidence of aneurysm, dissection, vasculitis or significant stenosis.   Veins: No obvious venous abnormality within the limitations of this arterial phase study.   Review of the MIP images confirms the above findings.   NON-VASCULAR   Lower chest: There are small bilateral pleural effusions with compressive atelectasis in the lung bases, left greater than right.   Hepatobiliary: No focal liver abnormality is seen. Status post cholecystectomy. No biliary dilatation.   Pancreas: Unremarkable. No pancreatic ductal dilatation or surrounding inflammatory changes.   Spleen: Normal in size without focal abnormality.   Adrenals/Urinary Tract: The bladder is markedly distended. There is mild bilateral hydronephrosis without obstructing calculi identified. No perinephric stranding or fluid collection. Single 4 mm  nonobstructing right renal calculus. Adrenal glands are within normal limits.   Stomach/Bowel: There is a new left lower quadrant colostomy. The colon proximal to the colostomy demonstrates mild wall thickening (descending colon) without pneumatosis. Small bowel and stomach are within normal limits.   The  distal sigmoid colon and rectum are fluid distended, but otherwise unremarkable. There are several air-fluid collections in the pelvis which may or may not be bowel loops. Evaluation is limited secondary to lack of oral contrast in this region. The largest air-fluid collection is in the left lower quadrant measuring 6.8 x 5.6 by 5.2 cm (image 10/172). This region is suspicious for abscess and abuts the sigmoid colon staple line image 7/176.   There is a new enhancing air-fluid collection in the left paracolic gutter worrisome for abscess measuring 3.3 by 3.7 by 10.8 cm image 10/104.   No active gastrointestinal bleeding identified.   Lymphatic: No enlarged lymph nodes are seen.   Reproductive: Uterus is not visualized.   Other: There is no ascites. There is new diffuse body wall edema. Small fat containing umbilical hernia is again seen. There is a new midline abdominal wall wound without discrete focal fluid collection. There is a new left-sided ostomy which appears within normal limits. No free intraperitoneal air.   Musculoskeletal: Degenerative changes affect the spine.   IMPRESSION: VASCULAR   1. No active gastrointestinal bleeding identified. 2. No evidence for acute arterial abnormality. 3.  Aortic Atherosclerosis (ICD10-I70.0).   NON-VASCULAR   1. New left lower quadrant colostomy present. No evidence for bowel obstruction. Mild wall thickening of the descending colon proximal to the ostomy may represent nonspecific colitis. 2. New air-fluid collection in the left paracolic gutter worrisome for abscess measuring 3.3 x 3.7 x 10.8 cm. 3. Findings suspicious for  air-fluid collection abutting the sigmoid colon staple line measuring 6.8 x 5.6 x 5.2 cm. Findings may represent bowel leak and/or abscess. 4. Mild bilateral hydronephrosis likely secondary to markedly distended bladder. Recommend clinical correlation and follow-up. 5. Diffuse body wall edema. 6. Nonobstructing right renal calculus. 7. Small pleural effusions.     Electronically Signed   By: Ronney Asters M.D.   On: 08/11/2022 16:20 Assessment:   Lower GI bleed discharge  S/p hartmans for perforated diverticulitis and COLON CA  New pseudomonas infection of midline open wound from recent surgery  DM, Sacral wound, afib, depression, recent NSTEMI, CHF, CAD, AKI, hypothyroidism, hyponatremia    Plan:   We will discuss with interventional radiology tomorrow for possible drain placement for the fluid collections noted on CT scan.  The discharge from her rectal stump could potentially be from 1 of these fluid collections.  With the recent diagnosis of colon cancer, we will need to discuss with them possibility of seeding even though the past showed complete resection of the cancer.  N.p.o. after midnight.  Continue antibiotics in the meantime.  We will start Dakin's soaked wet-to-dry dressings for the midline Pseudomonas infection.  Continue to monitor with daily dressing changes.  Also discussed with oncology if inpatient consultation will be appropriate for the recent diagnosis of colon cancer.  Hospitalist consulted for management of chronic issues listed above.  Appreciate their recommendations.   labs/images/medications/previous chart entries reviewed personally and relevant changes/updates noted above.

## 2022-08-11 NOTE — Assessment & Plan Note (Signed)
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

## 2022-08-11 NOTE — Assessment & Plan Note (Signed)
Stable and not acutely exacerbated.   Last known LVEF of 25% Hold furosemide, spironolactone and lisinopril due to AKI Continue metoprolol

## 2022-08-11 NOTE — Assessment & Plan Note (Signed)
Most likely hypovolemic hyponatremia and may also be related to spironolactone use as well as SSRI Gentle IV fluid hydration Hold spironolactone for now due to AKI Repeat sodium levels in a.m.

## 2022-08-11 NOTE — Assessment & Plan Note (Signed)
Patient noted to have significant hyperglycemia Judicious IV fluid hydration Sliding scale insulin Check blood sugars before meals and at bedtime

## 2022-08-11 NOTE — Consult Note (Addendum)
Initial Consultation Note   Patient: Sarah [name] KZS:[id number] DOB: Oct 27, 1946 PCP: Rusty Aus, MD DOA: 08/11/2022 DOS: the patient was seen and examined on 08/11/2022 Primary service: Benjamine Sprague, DO  Referring physician: Dr Lysle Pearl Reason for consult: Management of medical problems  Assessment/Plan: Assessment and Plan: * Rectal bleeding Patient presents for evaluation of bright red blood per rectum CT angiogram does not show any evidence of active GI bleed Hemoglobin is stable at 9.5 with a 1 g drop from her last hospitalization Hold aspirin Check serial H&H  Uncontrolled type 2 diabetes mellitus with hyperglycemia, without long-term current use of insulin ([place]) Patient noted to have significant hyperglycemia Judicious IV fluid hydration Sliding scale insulin Check blood sugars before meals and at bedtime  Protein-calorie malnutrition, severe Patient noted to have severe protein calorie malnutrition related to acute illness as evidenced by severe fat and muscle depletion Continue Glucerna shakes 3 times daily  Pressure injury of skin Stage II sacral ulcer present on admission We will request wound care consult DuoDERM every 72 hours  Paroxysmal atrial fibrillation (HCC) Continue amiodarone and metoprolol for rate control Patient not on long-term anticoagulation due to GI bleed  Depression Continue sertraline  Intra-abdominal abscess post-procedure Patient noted to have intra-abdominal abscesses She is afebrile and has no leukocytosis Patient started on ciprofloxacin and Flagyl by surgery Further treatment plan per surgery  Chronic systolic CHF (congestive heart failure) ([place]) Stable and not acutely exacerbated.   Last known LVEF of 25% Hold furosemide, spironolactone and lisinopril due to AKI Continue metoprolol  CAD (coronary artery disease) Patient noted to have three-vessel disease and not a candidate for CABG due to multiple comorbidities Continue  atorvastatin and metoprolol Aspirin on hold due to rectal bleeding  AKI (acute kidney injury) ([place]) Most likely pre renal related to poor oral intake with concomitant diuretic use and a possible postobstructive component Imaging shows mild bilateral hydronephrosis likely secondary to markedly distended bladder. Patient has a baseline serum creatinine of 0.5 and today on admission it is 1.54 Hold furosemide, spironolactone and lisinopril We will also request a bladder scan and insert Foley catheter to decompress bladder depending on the volume of urine  Hypothyroidism Continue Synthroid  Hyponatremia Most likely hypovolemic hyponatremia and may also be related to spironolactone use as well as SSRI Gentle IV fluid hydration Hold spironolactone for now due to AKI Repeat sodium levels in a.m.        TRH will continue to follow the patient.  HPI: Sarah [name] is a 76 y.o. female with past medical history significant for Graves' disease status post radiation therapy, diabetes mellitus on oral agents, hypertension, history of diverticulitis with abscess formation status post CT-guided drain placement, recent hospitalization and status post partial sigmoidectomy with Hartman's pouch for complicated diverticulitis, history of paroxysmal A-fib, postoperative non-ST elevation MI with left heart cath showing an LVEF of 25% with global hypokinesis and triple-vessel disease, chronic systolic CHF, protein calorie malnutrition who was recently discharged to skilled nursing facility for subacute rehab and was brought back to the hospital today for evaluation of bright red blood per rectum.  The stool in her colostomy bag is brown and guaiac negative. Patient complains of feeling weak which she said is unchanged and denies having any abdominal pain.  She has been nonambulatory since her discharge and complains of bilateral lower extremity weakness.  She is only on an aspirin daily and does not take any  nonsteroidal anti-inflammatory agents.  She is not on any  long-term anticoagulation.  She denies having any chest pain, no shortness of breath, no abdominal pain, no fever, no chills, no urinary symptoms, no leg swelling, no blurred vision no focal deficit. She had a CT angio of the abdomen which did not show any active GI bleed but showed  new left lower quadrant colostomy present. No evidence for bowel obstruction. Mild wall thickening of the descending colon proximal to the ostomy may represent nonspecific colitis. New air-fluid collection in the left paracolic gutter worrisome for abscess measuring 3.3 x 3.7 x 10.8 cm. Findings suspicious for air-fluid collection abutting the sigmoid colon staple line measuring 6.8 x 5.6 x 5.2 cm. Findings may represent bowel leak and/or abscess. Mild bilateral hydronephrosis likely secondary to markedly distended bladder. Recommend clinical correlation and follow-up.Diffuse body wall edema.Nonobstructing right renal calculus.Small pleural effusions. Labs also reveal worsening of her renal function from baseline 0.5 >> 1.54 Medical consult requested for comanagement during this hospitalization   Review of Systems: As mentioned in the history of present illness. All other systems reviewed and are negative. Past Medical History:  Diagnosis Date   Arthritis    hands   Cancer ([place])    thyroid- radiation   Diabetes mellitus without complication ([place])    Hyperlipidemia    Hypertension    Hypothyroidism    IBS (irritable bowel syndrome)    Past Surgical History:  Procedure Laterality Date   ABDOMINAL HYSTERECTOMY     CATARACT EXTRACTION W/PHACO Left 12/21/2016   Procedure: CATARACT EXTRACTION PHACO AND INTRAOCULAR LENS PLACEMENT ([place])  left eye;  Surgeon: Leandrew Koyanagi, MD;  Location: [facility];  Service: Ophthalmology;  Laterality: Left;  Diabetic - oral meds   CATARACT EXTRACTION W/PHACO Right 01/25/2017   Procedure: CATARACT EXTRACTION  PHACO AND INTRAOCULAR LENS PLACEMENT ([place])  right diabetic;  Surgeon: Leandrew Koyanagi, MD;  Location: [facility];  Service: Ophthalmology;  Laterality: Right;  diabetic - oral meds   CHOLECYSTECTOMY     COLONOSCOPY WITH PROPOFOL N/A 10/26/2016   Procedure: COLONOSCOPY WITH PROPOFOL;  Surgeon: Manya Silvas, MD;  Location: [facility] ENDOSCOPY;  Service: Endoscopy;  Laterality: N/A;   IR RADIOLOGIST EVAL & MGMT  06/14/2022   KNEE SURGERY Right    LEFT HEART CATH AND CORONARY ANGIOGRAPHY N/A 07/29/2022   Procedure: LEFT HEART CATH AND CORONARY ANGIOGRAPHY;  Surgeon: Yolonda Kida, MD;  Location: [place] [facility];  Service: Cardiovascular;  Laterality: N/A;   Social History:  reports that she has never smoked. She has never used smokeless tobacco. She reports that she does not drink alcohol and does not use drugs.  Allergies  Allergen Reactions   Penicillins Anaphylaxis    TOLERATED CEFOTETAN 06/2022   Sulfa Antibiotics Rash    Family History  Problem Relation Age of Onset   Breast cancer Neg Hx     Prior to Admission medications   Medication Sig Start Date End Date Taking? Authorizing Provider  amiodarone (PACERONE) 200 MG tablet Take 1 tablet (200 mg total) by mouth daily. 08/02/22  Yes Annita Brod, MD  ascorbic acid (VITAMIN C) 500 MG tablet Take 1 tablet (500 mg total) by mouth 2 (two) times daily. 08/01/22  Yes Annita Brod, MD  aspirin 81 MG chewable tablet Chew 1 tablet (81 mg total) by mouth daily. 08/02/22  Yes Annita Brod, MD  atorvastatin (LIPITOR) 40 MG tablet Take 1 tablet (40 mg total) by mouth at bedtime. 08/01/22  Yes Annita Brod, MD  cyanocobalamin  1000 MCG tablet Take 1,000 mcg by mouth daily.   Yes [provider]  furosemide (LASIX) 40 MG tablet Take 40 mg by mouth daily.   Yes [provider]  gabapentin (NEURONTIN) 100 MG capsule Take 2 capsules (200 mg total) by mouth 2 (two) times daily. 08/01/22  Yes Annita Brod, MD  glimepiride (AMARYL) 4 MG tablet Take 4 mg by mouth daily with breakfast. 09/29/21 09/29/22 Yes [provider]  ipratropium (ATROVENT) 0.02 % nebulizer solution Take 2.5 mLs (0.5 mg total) by nebulization 2 (two) times daily. 08/01/22  Yes Annita Brod, MD  levalbuterol Penne Lash) 0.63 MG/3ML nebulizer solution Take 3 mLs (0.63 mg total) by nebulization 2 (two) times daily. 08/01/22  Yes Annita Brod, MD  levothyroxine (SYNTHROID) 125 MCG tablet Take 1 tablet (125 mcg total) by mouth daily before breakfast. 08/01/22  Yes Annita Brod, MD  lisinopril (ZESTRIL) 2.5 MG tablet Take 1 tablet (2.5 mg total) by mouth daily. 08/02/22  Yes Annita Brod, MD  magnesium oxide (MAG-OX) 400 (240 Mg) MG tablet Take 400 mg by mouth daily.   Yes [provider]  metoprolol tartrate (LOPRESSOR) 25 MG tablet Take 1 tablet (25 mg total) by mouth 3 (three) times daily. 08/01/22  Yes Annita Brod, MD  Multiple Vitamin (MULTIVITAMIN WITH MINERALS) TABS tablet Take 1 tablet by mouth daily. 08/02/22  Yes Annita Brod, MD  potassium chloride SA (KLOR-CON M) 20 MEQ tablet Take 20 mEq by mouth 2 (two) times daily.   Yes [provider]  predniSONE (DELTASONE) 10 MG tablet Take 10 mg by mouth daily with breakfast.   Yes [provider]  sertraline (ZOLOFT) 100 MG tablet Take 100 mg by mouth daily.   Yes [provider]  spironolactone (ALDACTONE) 25 MG tablet Take 0.5 tablets (12.5 mg total) by mouth daily. 08/02/22  Yes Annita Brod, MD  venlafaxine (EFFEXOR) 37.5 MG tablet Take 37.5 mg by mouth daily.   Yes [provider]  zinc sulfate 220 (50 Zn) MG capsule Take 1 capsule (220 mg total) by mouth daily. 08/02/22  Yes Annita Brod, MD  acetaminophen (TYLENOL) 325 MG tablet Take 2 tablets (650 mg total) by mouth every 4 (four) hours as needed for headache or mild pain. 08/01/22   Annita Brod, MD  feeding supplement (ENSURE  ENLIVE / ENSURE PLUS) LIQD Take 237 mLs by mouth 3 (three) times daily between meals. 08/01/22   Annita Brod, MD  ipratropium-albuterol (DUONEB) 0.5-2.5 (3) MG/3ML SOLN Take 3 mLs by nebulization every 6 (six) hours as needed. 08/01/22   Annita Brod, MD  ondansetron (ZOFRAN-ODT) 4 MG disintegrating tablet Take 1 tablet (4 mg total) by mouth every 8 (eight) hours as needed for nausea or vomiting. 06/05/22   Arta Silence, MD  oxyCODONE-acetaminophen (PERCOCET/ROXICET) 5-325 MG tablet Take 1 tablet by mouth every 6 (six) hours as needed for moderate pain. 08/01/22   Annita Brod, MD  simvastatin (ZOCOR) 20 MG tablet Take 20 mg by mouth daily. Patient not taking: Reported on 08/11/2022    [provider]  sodium chloride flush (NS) 0.9 % SOLN 5 mLs by Intracatheter route every 8 (eight) hours. 05/30/22 08/28/22  Benjamine Sprague, DO    Physical Exam: Vitals:   08/11/22 1502 08/11/22 1600 08/11/22 1630 08/11/22 1700  BP:  119/72 124/76 124/71  Pulse:  72 73 76  Resp:  (!) '21 14 17  '$ Temp:  TempSrc:      SpO2:  95% 97% 98%  Weight: 55.7 kg     Height: '5\' 3"'$  (1.6 m)      Physical Exam Vitals and nursing note reviewed.  Constitutional:      Comments: Weak and chronically ill-appearing  HENT:     Head: Normocephalic and atraumatic.     Nose: Nose normal.     Mouth/Throat:     Mouth: Mucous membranes are dry.  Eyes:     Comments: Pale conjunctiva  Cardiovascular:     Rate and Rhythm: Normal rate and regular rhythm.  Pulmonary:     Effort: Pulmonary effort is normal.     Breath sounds: Normal breath sounds.  Abdominal:     General: Bowel sounds are normal.     Palpations: Abdomen is soft.     Comments: Colostomy bag in place, midline abdominal dressing in place  Musculoskeletal:        General: Normal range of motion.     Cervical back: Normal range of motion and neck supple.  Skin:    General: Skin is warm and dry.  Neurological:     Mental Status: She is  alert.     Motor: Weakness present.  Psychiatric:        Mood and Affect: Mood normal.        Behavior: Behavior normal.     Data Reviewed: Relevant notes from primary care and specialist visits, past discharge summaries as available in EHR, including Care Everywhere. Prior diagnostic testing as pertinent to current admission diagnoses Updated medications and problem lists for reconciliation ED course, including vitals, labs, imaging, treatment and response to treatment Triage notes, nursing and pharmacy notes and ED provider's notes Notable results as noted in HPI Labs reviewed.  Sodium 128, potassium 4.5, chloride 89, bicarb 28, glucose 398, BUN 47, creatinine 1.54, calcium 8.4, total protein 5.9, albumin 2.2, AST 30, ALT 26, alkaline phosphatase 67, PT 14.8, INR 1.2, white count 6.1, hemoglobin 9.5, hematocrit 29.5, platelet count 148 Twelve-lead EKG reviewed by me shows sinus rhythm.  LVH, diffuse T wave changes There are no new results to review at this time.    Family Communication: Greater than 50% of time was spent discussing plan of care with patient at the bedside.  CODE STATUS was discussed and she wishes to be a DO NOT RESUSCITATE. Primary team communication:  Thank you very much for involving [place] in the care of your patient.  Author: Collier Bullock, MD 08/11/2022 5:24 PM  For on call review [email or web address].

## 2022-08-11 NOTE — Assessment & Plan Note (Signed)
Most likely pre renal related to poor oral intake with concomitant diuretic use and a possible postobstructive component Imaging shows mild bilateral hydronephrosis likely secondary to markedly distended bladder. Patient has a baseline serum creatinine of 0.5 and today on admission it is 1.54 Hold furosemide, spironolactone and lisinopril We will also request a bladder scan and insert Foley catheter to decompress bladder depending on the volume of urine

## 2022-08-11 NOTE — ED Triage Notes (Addendum)
Patient from [facility], here today because staff noted BRBPR; She recently had a colostomy placed as a result from a ruptured diverticula; She has no pain but is tender with palpation; Takes 1 baby ASA daily besides that, does not take any anticoagulants

## 2022-08-11 NOTE — Assessment & Plan Note (Signed)
Continue sertraline 

## 2022-08-11 NOTE — Assessment & Plan Note (Addendum)
Patient noted to have intra-abdominal abscesses She is afebrile and has no leukocytosis Patient started on ciprofloxacin and Flagyl by surgery Further treatment plan per surgery

## 2022-08-12 ENCOUNTER — Inpatient Hospital Stay: Payer: Medicare Other

## 2022-08-12 ENCOUNTER — Encounter: Payer: Self-pay | Admitting: Surgery

## 2022-08-12 ENCOUNTER — Other Ambulatory Visit: Payer: Self-pay

## 2022-08-12 DIAGNOSIS — I5022 Chronic systolic (congestive) heart failure: Secondary | ICD-10-CM | POA: Diagnosis not present

## 2022-08-12 DIAGNOSIS — I959 Hypotension, unspecified: Secondary | ICD-10-CM | POA: Diagnosis not present

## 2022-08-12 DIAGNOSIS — N179 Acute kidney failure, unspecified: Secondary | ICD-10-CM

## 2022-08-12 DIAGNOSIS — I480 Paroxysmal atrial fibrillation: Secondary | ICD-10-CM

## 2022-08-12 DIAGNOSIS — E43 Unspecified severe protein-calorie malnutrition: Secondary | ICD-10-CM

## 2022-08-12 DIAGNOSIS — E1165 Type 2 diabetes mellitus with hyperglycemia: Secondary | ICD-10-CM

## 2022-08-12 DIAGNOSIS — E871 Hypo-osmolality and hyponatremia: Secondary | ICD-10-CM | POA: Diagnosis not present

## 2022-08-12 DIAGNOSIS — K625 Hemorrhage of anus and rectum: Secondary | ICD-10-CM | POA: Diagnosis not present

## 2022-08-12 DIAGNOSIS — E162 Hypoglycemia, unspecified: Secondary | ICD-10-CM | POA: Diagnosis not present

## 2022-08-12 LAB — COMPREHENSIVE METABOLIC PANEL
ALT: 23 U/L (ref 0–44)
AST: 21 U/L (ref 15–41)
Albumin: 2.2 g/dL — ABNORMAL LOW (ref 3.5–5.0)
Alkaline Phosphatase: 63 U/L (ref 38–126)
Anion gap: 11 (ref 5–15)
BUN: 38 mg/dL — ABNORMAL HIGH (ref 8–23)
CO2: 28 mmol/L (ref 22–32)
Calcium: 8.4 mg/dL — ABNORMAL LOW (ref 8.9–10.3)
Chloride: 93 mmol/L — ABNORMAL LOW (ref 98–111)
Creatinine, Ser: 1.07 mg/dL — ABNORMAL HIGH (ref 0.44–1.00)
GFR, Estimated: 54 mL/min — ABNORMAL LOW (ref >60)
Glucose, Bld: 223 mg/dL — ABNORMAL HIGH (ref 70–99)
Potassium: 3.8 mmol/L (ref 3.5–5.1)
Sodium: 132 mmol/L — ABNORMAL LOW (ref 135–145)
Total Bilirubin: 0.7 mg/dL (ref 0.3–1.2)
Total Protein: 5.9 g/dL — ABNORMAL LOW (ref 6.5–8.1)

## 2022-08-12 LAB — GLUCOSE, CAPILLARY
Glucose-Capillary: 339 mg/dL — ABNORMAL HIGH (ref 70–99)
Glucose-Capillary: 241 mg/dL — ABNORMAL HIGH (ref 70–99)
Glucose-Capillary: 128 mg/dL — ABNORMAL HIGH (ref 70–99)
Glucose-Capillary: 44 mg/dL — CL (ref 70–99)
Glucose-Capillary: 173 mg/dL — ABNORMAL HIGH (ref 70–99)
Glucose-Capillary: 163 mg/dL — ABNORMAL HIGH (ref 70–99)
Glucose-Capillary: 53 mg/dL — ABNORMAL LOW (ref 70–99)
Glucose-Capillary: 66 mg/dL — ABNORMAL LOW (ref 70–99)
Glucose-Capillary: 140 mg/dL — ABNORMAL HIGH (ref 70–99)
Glucose-Capillary: 42 mg/dL — CL (ref 70–99)

## 2022-08-12 LAB — CBC
HCT: 30.6 % — ABNORMAL LOW (ref 36.0–46.0)
Hemoglobin: 9.8 g/dL — ABNORMAL LOW (ref 12.0–15.0)
MCH: 29.2 pg (ref 26.0–34.0)
MCHC: 32 g/dL (ref 30.0–36.0)
MCV: 91.1 fL (ref 80.0–100.0)
Platelets: 182 10*3/uL (ref 150–400)
RBC: 3.36 MIL/uL — ABNORMAL LOW (ref 3.87–5.11)
RDW: 18.4 % — ABNORMAL HIGH (ref 11.5–15.5)
WBC: 8.8 10*3/uL (ref 4.0–10.5)
nRBC: 0 % (ref 0.0–0.2)

## 2022-08-12 LAB — HEMOGLOBIN AND HEMATOCRIT, BLOOD
HCT: 32.6 % — ABNORMAL LOW (ref 36.0–46.0)
Hemoglobin: 10.2 g/dL — ABNORMAL LOW (ref 12.0–15.0)

## 2022-08-12 MED ORDER — MIDAZOLAM HCL 2 MG/2ML IJ SOLN
INTRAMUSCULAR | Status: AC
  Filled 2022-08-12: qty 2

## 2022-08-12 MED ORDER — INSULIN ASPART 100 UNIT/ML IJ SOLN: 0.0000 [IU] | Freq: Three times a day (TID) | INTRAMUSCULAR | Status: DC

## 2022-08-12 MED ORDER — DEXTROSE 50 % IV SOLN
INTRAVENOUS | Status: AC
  Administered 2022-08-12: 50 mL
  Filled 2022-08-12: qty 50

## 2022-08-12 MED ORDER — INSULIN ASPART 100 UNIT/ML IJ SOLN
0.0000 [IU] | INTRAMUSCULAR | Status: DC
  Administered 2022-08-12: 2 [IU] via SUBCUTANEOUS
  Administered 2022-08-12: 11 [IU] via SUBCUTANEOUS
  Filled 2022-08-12 (×2): qty 1

## 2022-08-12 MED ORDER — FENTANYL CITRATE (PF) 100 MCG/2ML IJ SOLN
INTRAMUSCULAR | Status: AC
  Filled 2022-08-12: qty 2

## 2022-08-12 MED ORDER — CIPROFLOXACIN IN D5W 400 MG/200ML IV SOLN
400.0000 mg | Freq: Two times a day (BID) | INTRAVENOUS | Status: DC
  Administered 2022-08-12 – 2022-08-13 (×3): 400 mg via INTRAVENOUS
  Filled 2022-08-12 (×5): qty 200

## 2022-08-12 MED ORDER — METRONIDAZOLE 500 MG/100ML IV SOLN
500.0000 mg | Freq: Two times a day (BID) | INTRAVENOUS | Status: DC
  Administered 2022-08-12 – 2022-08-17 (×10): 500 mg via INTRAVENOUS
  Filled 2022-08-12 (×12): qty 100

## 2022-08-12 MED ORDER — SODIUM CHLORIDE 0.9 % IV BOLUS
250.0000 mL | Freq: Once | INTRAVENOUS | Status: AC
  Administered 2022-08-12: 250 mL via INTRAVENOUS

## 2022-08-12 MED ORDER — DEXTROSE 50 % IV SOLN
25.0000 g | INTRAVENOUS | Status: AC
  Administered 2022-08-12: 25 g via INTRAVENOUS

## 2022-08-12 MED ORDER — DEXTROSE 50 % IV SOLN
25.0000 g | INTRAVENOUS | Status: AC
  Administered 2022-08-12: 25 g via INTRAVENOUS
  Filled 2022-08-12: qty 50

## 2022-08-12 MED ORDER — MEDIHONEY WOUND/BURN DRESSING EX PSTE
1.0000 | PASTE | Freq: Every day | CUTANEOUS | Status: DC
  Administered 2022-08-12 – 2022-08-16 (×6): 1 via TOPICAL
  Filled 2022-08-12: qty 44

## 2022-08-12 MED ORDER — DEXTROSE 5 % IV SOLN: INTRAVENOUS | Status: DC

## 2022-08-12 MED ORDER — DEXTROSE 50 % IV SOLN
INTRAVENOUS | Status: AC
  Filled 2022-08-12: qty 50

## 2022-08-12 NOTE — Progress Notes (Addendum)
Progress Note    Sarah [name]  VFI:[id number] DOB: 04/19/1946  DOA: 08/11/2022 PCP: Rusty Aus, MD      Brief Narrative:    Medical records reviewed and are as summarized below:  Sarah [name] is a 76 y.o. female with past medical history significant for Graves' disease status post radiation therapy, diabetes mellitus on oral agents, hypertension, history of diverticulitis with abscess formation status post CT-guided drain placement, recent hospitalization and status post partial sigmoidectomy with Hartman's pouch for complicated diverticulitis, history of paroxysmal A-fib, postoperative non-ST elevation MI with left heart cath showing an LVEF of 25% with global hypokinesis and triple-vessel disease, chronic systolic CHF, protein calorie malnutrition who was recently discharged to skilled nursing facility for subacute rehab.  She was brought back to the hospital on 08/11/2022 for evaluation of bright red blood per rectum.  However, there was no blood in the stool from her colostomy bag.  She is on aspirin for CAD but she is not on long-term anticoagulation for atrial fibrillation.  She was found to have intra-abdominal abscess.     Assessment/Plan:   Principal Problem:   Rectal bleeding Active Problems:   Protein-calorie malnutrition, severe   Uncontrolled type 2 diabetes mellitus with hyperglycemia, without long-term current use of insulin (HCC)   Pressure injury of skin   Hyponatremia   Hypothyroidism   AKI (acute kidney injury) ([place])   CAD (coronary artery disease)   Chronic systolic CHF (congestive heart failure) (HCC)   Intra-abdominal abscess post-procedure   Depression   Paroxysmal atrial fibrillation (HCC)   Hypoglycemia   Hypotension    Body mass index is 21.75 kg/m.   Midline abdominal Pseudomonas open wound infection from recent surgery, intra-abdominal abscess, s/p Hartman's procedure for perforated diverticulitis and colon cancer on 07/23/2022: Continue  IV antibiotics.  Plan for abdominal drain placement by interventional radiologist.  Follow-up with general surgeon.  Rectal bleeding/rectal discharge: No active rectal bleeding or discharge on inspection  AKI and hyponatremia: Improving.  Lisinopril, Aldactone, Lasix have been held.  Acute metabolic encephalopathy: Likely multifactorial (infection vs hypoglycemia, hyponatremia, AKI vs hypotension vs medication-Gabapentin)  Type II DM with hyperglycemia, hypoglycemia: S/p treatment with IV dextrose x2.  Glucose dropped to 44 this morning and improved to 173 after treatment.  It dropped again to 53 in the afternoon and this improved to 163 after treatment.  Hold insulin for now because of recurrent hypoglycemia and n.p.o. status.  Glimepiride is on hold.  Hypotension: BP dropped to 92/43.  She was given normal saline to 250 mL bolus.  BP improved to 116/60.  Chronic systolic CHF, CAD: Lasix and Aldactone have been held.  Paroxysmal atrial fibrillation: Continue amiodarone and metoprolol as able  Stage II sacral decubitus ulcer: Continue local wound care  Other comorbidities include hypothyroidism, depression  Plan discussed with her nurses Barth Kirks, [place]). Plan was also discussed with Dr. Lysle Pearl, attending general surgeon.  CRITICAL CARE Performed by: Jennye Boroughs   Total critical care time: 45 minutes  Critical care time was exclusive of separately billable procedures and treating other patients.  Critical care was necessary to treat or prevent imminent or life-threatening deterioration.  Critical care was time spent personally by me on the following activities: development of treatment plan with patient and/or surrogate as well as nursing, discussions with consultants, evaluation of patient's response to treatment, examination of patient, obtaining history from patient or surrogate, ordering and performing treatments and interventions, ordering and review of  laboratory  studies, ordering and review of radiographic studies, pulse oximetry and re-evaluation of patient's condition.   Diet Order             Diet NPO time specified  Diet effective midnight                            Consultants: General surgeon  Procedures: Plan for abdominal drain placement today    Medications:    amiodarone  200 mg Oral Daily   atorvastatin  40 mg Oral QHS   dextrose  25 g Intravenous STAT   dextrose       feeding supplement  237 mL Oral TID BM   ipratropium  0.5 mg Nebulization BID   leptospermum manuka honey  1 Application Topical Daily   levothyroxine  125 mcg Oral QAC breakfast   metoprolol tartrate  25 mg Oral TID   sertraline  100 mg Oral Daily   sodium hypochlorite   Irrigation Daily   venlafaxine  37.5 mg Oral Daily   Continuous Infusions:  ciprofloxacin     And   metronidazole       Anti-infectives (From admission, onward)    Start     Dose/Rate Route Frequency Ordered Stop   08/12/22 2000  metroNIDAZOLE (FLAGYL) IVPB 500 mg       See Hyperspace for full Linked Orders Report.   500 mg 100 mL/hr over 60 Minutes Intravenous Every 12 hours 08/12/22 0902 08/18/22 1959   08/12/22 1000  ciprofloxacin (CIPRO) IVPB 400 mg       See Hyperspace for full Linked Orders Report.   400 mg 200 mL/hr over 60 Minutes Intravenous Every 12 hours 08/12/22 0902     08/11/22 2000  metroNIDAZOLE (FLAGYL) IVPB 500 mg  Status:  Discontinued       See Hyperspace for full Linked Orders Report.   500 mg 100 mL/hr over 60 Minutes Intravenous Every 12 hours 08/11/22 1712 08/12/22 0902   08/11/22 1900  ciprofloxacin (CIPRO) IVPB 400 mg  Status:  Discontinued       See Hyperspace for full Linked Orders Report.   400 mg 200 mL/hr over 60 Minutes Intravenous Every 24 hours 08/11/22 1712 08/12/22 0902              Family Communication/Anticipated D/C date and plan/Code Status   DVT prophylaxis: SCDs Start: 08/11/22 1713     Code Status:  DNR  Family Communication: Plan discussed with Judson Roch, her sister, at the bedside Disposition Plan: To be determined by surgical surgeon         Subjective:   Interval events noted.  Nursing staff reported patient has been more lethargic.  She is unable to provide any history because of confusion.  Objective:    Vitals:   08/12/22 1348 08/12/22 1407 08/12/22 1436 08/12/22 1510  BP: (!) 93/44 (!) 92/43 (!) 102/49 116/60  Pulse: 81 80 83 94  Resp: 16  16   Temp: 99 F (37.2 C)     TempSrc: Oral     SpO2: 98% 95% 92% 99%  Weight:      Height:       No data found.   Intake/Output Summary (Last 24 hours) at 08/12/2022 1619 Last data filed at 08/12/2022 1011 Gross per 24 hour  Intake 0 ml  Output 1900 ml  Net -1900 ml   Filed Weights   08/11/22 1502  Weight: 55.7 kg  Exam:  GEN: NAD SKIN: Warm and dry.  Stage II sacral decubitus ulcer EYES: No pallor or icterus, PERRLA ENT: MMM CV: RRR PULM: CTA B ABD: soft, ND, NT, +BS, + colostomy bag with yellowish stools CNS: She was drowsy but was oriented to person earlier this morning EXT: No edema or tenderness RECTAL: No active rectal bleeding or discharge noted on inspection   Lorrin Jackson, RN, was at the bedside.  Data Reviewed:   I have personally reviewed following labs and imaging studies:  Labs: Labs show the following:   Basic Metabolic Panel: Recent Labs  Lab 08/11/22 1505 08/12/22 0413  NA 128* 132*  K 4.5 3.8  CL 89* 93*  CO2 28 28  GLUCOSE 398* 223*  BUN 47* 38*  CREATININE 1.54* 1.07*  CALCIUM 8.4* 8.4*   GFR Estimated Creatinine Clearance: 37 mL/min (A) (by C-G formula based on SCr of 1.07 mg/dL (H)). Liver Function Tests: Recent Labs  Lab 08/11/22 1505 08/12/22 0413  AST 30 21  ALT 26 23  ALKPHOS 67 63  BILITOT 0.8 0.7  PROT 5.9* 5.9*  ALBUMIN 2.2* 2.2*   No results for input(s): "LIPASE", "AMYLASE" in the last 168 hours. No results for input(s): "AMMONIA" in the last  168 hours. Coagulation profile Recent Labs  Lab 08/11/22 1511  INR 1.2    CBC: Recent Labs  Lab 08/11/22 1505 08/12/22 0001 08/12/22 0413  WBC 6.1  --  8.8  NEUTROABS 5.7  --   --   HGB 9.5* 10.2* 9.8*  HCT 29.5* 32.6* 30.6*  MCV 90.8  --  91.1  PLT 148*  --  182   Cardiac Enzymes: No results for input(s): "CKTOTAL", "CKMB", "CKMBINDEX", "TROPONINI" in the last 168 hours. BNP (last 3 results) No results for input(s): "PROBNP" in the last 8760 hours. CBG: Recent Labs  Lab 08/12/22 0809 08/12/22 1117 08/12/22 1136 08/12/22 1535 08/12/22 1601  GLUCAP 128* 44* 173* 53* 163*   D-Dimer: No results for input(s): "DDIMER" in the last 72 hours. Hgb A1c: No results for input(s): "HGBA1C" in the last 72 hours. Lipid Profile: No results for input(s): "CHOL", "HDL", "LDLCALC", "TRIG", "CHOLHDL", "LDLDIRECT" in the last 72 hours. Thyroid function studies: No results for input(s): "TSH", "T4TOTAL", "T3FREE", "THYROIDAB" in the last 72 hours.  Invalid input(s): "FREET3" Anemia work up: No results for input(s): "VITAMINB12", "FOLATE", "FERRITIN", "TIBC", "IRON", "RETICCTPCT" in the last 72 hours. Sepsis Labs: Recent Labs  Lab 08/11/22 1505 08/12/22 0413  WBC 6.1 8.8    Microbiology No results found for this or any previous visit (from the past 240 hour(s)).  Procedures and diagnostic studies:  CT ANGIO GI BLEED  Result Date: 08/11/2022 CLINICAL DATA:  Lower GI bleeding. Bright red blood per rectum. Colostomy. EXAM: CTA ABDOMEN AND PELVIS WITHOUT AND WITH CONTRAST TECHNIQUE: Multidetector CT imaging of the abdomen and pelvis was performed using the standard protocol during bolus administration of intravenous contrast. Multiplanar reconstructed images and MIPs were obtained and reviewed to evaluate the vascular anatomy. RADIATION DOSE REDUCTION: This exam was performed according to the departmental dose-optimization program which includes automated exposure control,  adjustment of the mA and/or kV according to patient size and/or use of iterative reconstruction technique. CONTRAST:  64m OMNIPAQUE IOHEXOL 350 MG/ML SOLN COMPARISON:  CT abdomen and pelvis 06/14/2022 FINDINGS: VASCULAR Aorta: Normal caliber aorta without aneurysm, dissection, vasculitis or significant stenosis. Moderate calcified atherosclerotic disease seen throughout the aorta. Celiac: Patent without evidence of aneurysm, dissection, vasculitis or significant stenosis. SMA:  Patent without evidence of aneurysm, dissection, vasculitis or significant stenosis. Renals: Both renal arteries are patent without evidence of aneurysm, dissection, vasculitis, fibromuscular dysplasia or significant stenosis. Accessory left renal artery present. 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 Artery is otherwise widely patent. Inflow: Patent without evidence of aneurysm, dissection, vasculitis or significant stenosis. Proximal Outflow: Bilateral common femoral and visualized portions of the superficial and profunda femoral arteries are patent without evidence of aneurysm, dissection, vasculitis or significant stenosis. Veins: No obvious venous abnormality within the limitations of this arterial phase study. Review of the MIP images confirms the above findings. NON-VASCULAR Lower chest: There are small bilateral pleural effusions with compressive atelectasis in the lung bases, left greater than right. Hepatobiliary: No focal liver abnormality is seen. Status post cholecystectomy. No biliary dilatation. Pancreas: Unremarkable. No pancreatic ductal dilatation or surrounding inflammatory changes. Spleen: Normal in size without focal abnormality. Adrenals/Urinary Tract: The bladder is markedly distended. There is mild bilateral hydronephrosis without obstructing calculi identified. No perinephric stranding or fluid collection. Single 4 mm nonobstructing right renal  calculus. Adrenal glands are within normal limits. Stomach/Bowel: There is a new left lower quadrant colostomy. The colon proximal to the colostomy demonstrates mild wall thickening (descending colon) without pneumatosis. Small bowel and stomach are within normal limits. The distal sigmoid colon and rectum are fluid distended, but otherwise unremarkable. There are several air-fluid collections in the pelvis which may or may not be bowel loops. Evaluation is limited secondary to lack of oral contrast in this region. The largest air-fluid collection is in the left lower quadrant measuring 6.8 x 5.6 by 5.2 cm (image 10/172). This region is suspicious for abscess and abuts the sigmoid colon staple line image 7/176. There is a new enhancing air-fluid collection in the left paracolic gutter worrisome for abscess measuring 3.3 by 3.7 by 10.8 cm image 10/104. No active gastrointestinal bleeding identified. Lymphatic: No enlarged lymph nodes are seen. Reproductive: Uterus is not visualized. Other: There is no ascites. There is new diffuse body wall edema. Small fat containing umbilical hernia is again seen. There is a new midline abdominal wall wound without discrete focal fluid collection. There is a new left-sided ostomy which appears within normal limits. No free intraperitoneal air. Musculoskeletal: Degenerative changes affect the spine. IMPRESSION: VASCULAR 1. No active gastrointestinal bleeding identified. 2. No evidence for acute arterial abnormality. 3.  Aortic Atherosclerosis (ICD10-I70.0). NON-VASCULAR 1. New left lower quadrant colostomy present. No evidence for bowel obstruction. Mild wall thickening of the descending colon proximal to the ostomy may represent nonspecific colitis. 2. New air-fluid collection in the left paracolic gutter worrisome for abscess measuring 3.3 x 3.7 x 10.8 cm. 3. Findings suspicious for air-fluid collection abutting the sigmoid colon staple line measuring 6.8 x 5.6 x 5.2 cm. Findings  may represent bowel leak and/or abscess. 4. Mild bilateral hydronephrosis likely secondary to markedly distended bladder. Recommend clinical correlation and follow-up. 5. Diffuse body wall edema. 6. Nonobstructing right renal calculus. 7. Small pleural effusions. Electronically Signed   By: Ronney Asters M.D.   On: 08/11/2022 16:20               LOS: 1 day   Aalijah Lanphere  [facility]   Pager on [email or web address]. If 7PM-7AM, please contact night-coverage at [email or web address]     08/12/2022, 4:19 PM

## 2022-08-12 NOTE — Consult Note (Addendum)
[facility] Nurse ostomy consult note Stoma type/location: LLQ colostomy. Patient known to our service from last admission. Last seen by this writer on 9/4 for ostomy care. Stomal assessment/size: Oval, raised, located in a crease with additional creasing distal to stoma. Approximate 1 and 1/2 inches x 2 inches (last measured 2 weeks ago) Peristomal assessment: Not seen today Treatment options for stomal/peristomal skin: Skin barrier rings Output: Stoll, no visible blood according to EDP Ostomy pouching: 2pc. 2 and 3/4 inch pouching system with skin barrier ring Education provided: None. Enrolled patient in [facility] program: Yes, during last admission, however patient is coming from SNF/Rehab ([facility])  Orders for Nursing care for the ostomy are provided as well as ostomy product supply guidance.   Supplies are: 2 piece, 2 and 3/4 inch pouching system with skin barrier ring.  Pouch is [employer], skin barrier is Kellie Simmering #2, skin barrier rings are Kellie Simmering [phone]. It is recommended to order 5 of each at this time.  WOC Nurse Consult Note: Reason for Consult:Wound care to sacral PI (POA) Wound type:Pressure, Stage 2 Pressure Injury POA: Yes Measurement:Per Nursing Flow Sheet.  Nursing to measure today with next dressing change. Wound LJQ:GBEE, moist Drainage (amount, consistency, odor) small, serous Periwound: intact Dressing procedure/placement/frequency: I have provided guidance for placement of a mattress replacement with low air loss feature and for turning and repositioning to minimize time in the supine position. Bilateral, heel boots are provided. Topical care to the wound will be with a saline cleanse followed by dressing with MediHoney (topical antimicrobial). This is to be topped with a saline moistened gauze and covered with a dry gauze, then secured with a sacral bordered foam dressing.  [place] nursing team will not follow, but will remain available to this patient, the nursing and  medical teams.  Please re-consult if needed.  Thank you for inviting [place] to participate in this patient's Plan of Care.  Maudie Flakes, MSN, RN, CNS, [place], Serita Grammes, [employer], [employer] phone:  [phone]

## 2022-08-12 NOTE — Progress Notes (Signed)
Nurse notified patient glucose was 44, hypoglycemia protocol placed and D50 pushed, Dr. Mal Misty notified. Re-check glucose was 173, but patient noted to be more lethargic than earlier. Dr. Mal Misty notified. Specials canceled for drain placement, orders for STAT ABG and CT ordered. Patient is responding to voice, but difficult to arise by voice.

## 2022-08-12 NOTE — TOC CM/SW Note (Signed)
CSW attempted to meet with patient at bedside. Patient was drowsy and asked to talk later.  Per chart review, patient was at [facility] for short term rehab prior to admission. Patient will need new PT/OT recs and insurance auth if the plan is to return to [facility]. TOC will continue to follow.   Oleh Genin, [place]

## 2022-08-12 NOTE — Plan of Care (Signed)
Patient lethargic, MD notified, only repositioned today, all oral meds held. Foley in place, no BM.  Problem: Education: Goal: Understanding of CV disease, CV risk reduction, and recovery process will improve Outcome: Progressing Goal: Individualized Educational Video(s) Outcome: Progressing   Problem: Activity: Goal: Ability to return to baseline activity level will improve Outcome: Progressing   Problem: Cardiovascular: Goal: Ability to achieve and maintain adequate cardiovascular perfusion will improve Outcome: Progressing Goal: Vascular access site(s) Level 0-1 will be maintained Outcome: Progressing   Problem: Health Behavior/Discharge Planning: Goal: Ability to safely manage health-related needs after discharge will improve Outcome: Progressing   Problem: Education: Goal: Ability to describe self-care measures that may prevent or decrease complications (Diabetes Survival Skills Education) will improve Outcome: Progressing Goal: Individualized Educational Video(s) Outcome: Progressing   Problem: Cardiac: Goal: Ability to maintain an adequate cardiac output will improve Outcome: Progressing   Problem: Health Behavior/Discharge Planning: Goal: Ability to identify and utilize available resources and services will improve Outcome: Progressing Goal: Ability to manage health-related needs will improve Outcome: Progressing   Problem: Fluid Volume: Goal: Ability to achieve a balanced intake and output will improve Outcome: Progressing   Problem: Fluid Volume: Goal: Ability to achieve a balanced intake and output will improve Outcome: Progressing   Problem: Metabolic: Goal: Ability to maintain appropriate glucose levels will improve Outcome: Progressing   Problem: Nutritional: Goal: Maintenance of adequate nutrition will improve Outcome: Progressing Goal: Maintenance of adequate weight for body size and type will improve Outcome: Progressing   Problem:  Respiratory: Goal: Will regain and/or maintain adequate ventilation Outcome: Progressing   Problem: Urinary Elimination: Goal: Ability to achieve and maintain adequate renal perfusion and functioning will improve Outcome: Progressing   Problem: Education: Goal: Ability to describe self-care measures that may prevent or decrease complications (Diabetes Survival Skills Education) will improve Outcome: Progressing Goal: Individualized Educational Video(s) Outcome: Progressing   Problem: Coping: Goal: Ability to adjust to condition or change in health will improve Outcome: Progressing   Problem: Fluid Volume: Goal: Ability to maintain a balanced intake and output will improve Outcome: Progressing   Problem: Health Behavior/Discharge Planning: Goal: Ability to identify and utilize available resources and services will improve Outcome: Progressing Goal: Ability to manage health-related needs will improve Outcome: Progressing   Problem: Metabolic: Goal: Ability to maintain appropriate glucose levels will improve Outcome: Progressing   Problem: Nutritional: Goal: Maintenance of adequate nutrition will improve Outcome: Progressing Goal: Progress toward achieving an optimal weight will improve Outcome: Progressing   Problem: Skin Integrity: Goal: Risk for impaired skin integrity will decrease Outcome: Progressing   Problem: Tissue Perfusion: Goal: Adequacy of tissue perfusion will improve Outcome: Progressing   Problem: Education: Goal: Knowledge of General Education information will improve Description: Including pain rating scale, medication(s)/side effects and non-pharmacologic comfort measures Outcome: Progressing   Problem: Health Behavior/Discharge Planning: Goal: Ability to manage health-related needs will improve Outcome: Progressing   Problem: Clinical Measurements: Goal: Ability to maintain clinical measurements within normal limits will improve Outcome:  Progressing Goal: Will remain free from infection Outcome: Progressing Goal: Diagnostic test results will improve Outcome: Progressing Goal: Respiratory complications will improve Outcome: Progressing Goal: Cardiovascular complication will be avoided Outcome: Progressing   Problem: Activity: Goal: Risk for activity intolerance will decrease Outcome: Progressing   Problem: Nutrition: Goal: Adequate nutrition will be maintained Outcome: Progressing   Problem: Coping: Goal: Level of anxiety will decrease Outcome: Progressing   Problem: Elimination: Goal: Will not experience complications related to bowel motility Outcome: Progressing Goal: Will  not experience complications related to urinary retention Outcome: Progressing   Problem: Pain Managment: Goal: General experience of comfort will improve Outcome: Progressing   Problem: Safety: Goal: Ability to remain free from injury will improve Outcome: Progressing   Problem: Skin Integrity: Goal: Risk for impaired skin integrity will decrease Outcome: Progressing

## 2022-08-12 NOTE — Progress Notes (Signed)
Patient reassessed at bedside with Barth Kirks, RN. She's still lethargic and minimally responsive. Labs and vitals reviewed. BP was 92/50, otherwise vitals were normal. She's not in respiratory distress. She's moving her extremities spontaneously. PERRLA. Transfer to PCU for close monitoring. Monitor on tele.  CT head is pending. Give NS bolus only 250 mls for now because of underlying systolic dysfunction. Of note patient had Gabapentin last night. I'm not sure if that is making her more lethargic given her recent AKI. Continue to monitor. I called Ms. Hunter, sister, to give her an update

## 2022-08-12 NOTE — Progress Notes (Signed)
Called Dr. Mal Misty pertaining to STAT ABG's, no new orders given, still awaiting CT. Patient remains lethargic, v/s within normal range last glucose 173. Nurse given report.

## 2022-08-12 NOTE — Progress Notes (Signed)
PHARMACY NOTE:  ANTIMICROBIAL RENAL DOSAGE ADJUSTMENT  Current antimicrobial regimen includes a mismatch between antimicrobial dosage and estimated renal function.  As per policy approved by the Pharmacy & Therapeutics and Medical Executive Committees, the antimicrobial dosage will be adjusted accordingly.  Current antimicrobial dosage:   Cipro '400mg'$  V q24h  Indication: IAI  Renal Function:  Estimated Creatinine Clearance: 37 mL/min (A) (by C-G formula based on SCr of 1.07 mg/dL (H)).    *now > 30 ml/min  Antimicrobial dosage has been changed to:   Cipro '400mg'$  IV q12h  Additional comments:   Thank you for allowing pharmacy to be a part of this patient's care.  Andrienne Havener A, [facility] 08/12/2022 9:03 AM

## 2022-08-12 NOTE — Progress Notes (Signed)
I came to the bedside to reassess patient. She's more responsive and responds to voice or command. Systolic BP up to 498. If she continues to improve she may be able to proceed with abdominal drain today. Hopefully, this will improve her overall condition. Plan discussed with Barth Kirks, RN, at the bedside.

## 2022-08-12 NOTE — Progress Notes (Signed)
Nurse called in by patient family this am asking if patient was ok. Nurse inquired why they were asking was she different from last time they saw her. Patient was sleeping when nurses rounded this morning opened eye when name was called and went back to sleep. Family stated "she is not talking the same, yesterday she would hold a full conversation and has she been sedated?" Nurse told family patient had not been sedated and that she would notify provider. Patient vitals were in normal range and glucose. Nurse contacted Dr. Mal Misty and Lysle Pearl who both came bedside to assess patient, per Dr. Lysle Pearl patient was not far from baseline of what he witnessed yesterday and patient being treated for infection per Dr. Mal Misty possible reaction to infection. Patient sister came in shortly after asking what was going on, nurse updated her and told Dr. Mal Misty she was there and if he could go and speak with her, Dr. Mal Misty agreed.

## 2022-08-12 NOTE — Progress Notes (Addendum)
Request to IR for percutaneous drain placement within intra-abdominal fluid collections. Patient originally planned for drain placement today however just prior to sending for patient she became unstable and was unable to travel.   Will plan for drain placement tomorrow in [place]. Consent obtained from patient's sister and is in the IR PA office.  Patient to be NPO at midnight for possible procedure. Hold anticoagulation until post procedure. IR will call when ready for patient.  Candiss Norse, PA-C

## 2022-08-12 NOTE — Progress Notes (Signed)
Patient received mid shift. Very lethargic, hypotensive provider notified came and evaluated patient bedside. Bolus of NS 250 administered. Slight improvement in BP, patient did open her eyes and uttered some words.Awaiting transfer to PCU.

## 2022-08-12 NOTE — Progress Notes (Signed)
Subjective:  CC: Sarah [name] is a 76 y.o. female  Hospital stay day 1,   rectal discharge  HPI: No acute changes overnight.  Somewhat somnolent but answering questions  ROS:  Unable to obtain secondary to patient mentation   Objective:   Temp:  [97.9 F (36.6 C)-99.2 F (37.3 C)] 98.3 F (36.8 C) (09/15 0905) Pulse Rate:  [70-90] 90 (09/15 0905) Resp:  [13-21] 20 (09/15 0905) BP: (106-133)/(53-76) 110/53 (09/15 0905) SpO2:  [92 %-100 %] 97 % (09/15 0905) Weight:  [55.7 kg] 55.7 kg (09/14 1502)     Height: '5\' 3"'$  (160 cm) Weight: 55.7 kg BMI (Calculated): 21.76   Intake/Output this shift:   Intake/Output Summary (Last 24 hours) at 08/12/2022 1317 Last data filed at 08/12/2022 1011 Gross per 24 hour  Intake 0 ml  Output 1900 ml  Net -1900 ml    Constitutional :  cooperative, appears stated age, and slowed mentation  Respiratory:  clear to auscultation bilaterally  Cardiovascular:  regular rate and rhythm  Gastrointestinal: soft, non-tender; bowel sounds normal; no masses,  no organomegaly.   Skin: Cool and moist. Midline wound dressing intact  Psychiatric: Normal affect, non-agitated, not confused       LABS:     Latest Ref Rng & Units 08/12/2022    4:13 AM 08/11/2022    3:05 PM 08/01/2022    5:41 AM  CMP  Glucose 70 - 99 mg/dL 223  398  254   BUN 8 - 23 mg/dL 38  47  41   Creatinine 0.44 - 1.00 mg/dL 1.07  1.54  0.53   Sodium 135 - 145 mmol/L 132  128  138   Potassium 3.5 - 5.1 mmol/L 3.8  4.5  3.6   Chloride 98 - 111 mmol/L 93  89  92   CO2 22 - 32 mmol/L 28  28  34   Calcium 8.9 - 10.3 mg/dL 8.4  8.4  8.2   Total Protein 6.5 - 8.1 g/dL 5.9  5.9    Total Bilirubin 0.3 - 1.2 mg/dL 0.7  0.8    Alkaline Phos 38 - 126 U/L 63  67    AST 15 - 41 U/L 21  30    ALT 0 - 44 U/L 23  26        Latest Ref Rng & Units 08/12/2022    4:13 AM 08/12/2022   12:01 AM 08/11/2022    3:05 PM  CBC  WBC 4.0 - 10.5 K/uL 8.8   6.1   Hemoglobin 12.0 - 15.0 g/dL 9.8  10.2  9.5    Hematocrit 36.0 - 46.0 % 30.6  32.6  29.5   Platelets 150 - 400 K/uL 182   148     RADS: N/a Assessment:   Lower GI bleed discharge   S/p hartmans for perforated diverticulitis and COLON CA   New pseudomonas infection of midline open wound from recent surgery   DM, Sacral wound, afib, depression, recent NSTEMI, CHF, CAD, AKI, hypothyroidism, hyponatremia   Pending possible drainage by IR. Continue abx and NPO until then.  Mentation seems about the same since admission from yesterday on my exam.  Labs and vitals remain stable, physical exam with no obvious concerns either this am.  Slowed mentation but appropriately answering questions.  Continue to monitor closely.   Appreciate hospitalist recs.  Will consider oncology referral for recent colon CA diagnosis once more stable.  labs/images/medications/previous chart entries reviewed personally and relevant changes/updates noted  above.

## 2022-08-13 ENCOUNTER — Inpatient Hospital Stay: Payer: Medicare Other

## 2022-08-13 DIAGNOSIS — K625 Hemorrhage of anus and rectum: Secondary | ICD-10-CM | POA: Diagnosis not present

## 2022-08-13 DIAGNOSIS — I5022 Chronic systolic (congestive) heart failure: Secondary | ICD-10-CM | POA: Diagnosis not present

## 2022-08-13 DIAGNOSIS — C187 Malignant neoplasm of sigmoid colon: Secondary | ICD-10-CM | POA: Diagnosis not present

## 2022-08-13 DIAGNOSIS — E162 Hypoglycemia, unspecified: Secondary | ICD-10-CM | POA: Diagnosis not present

## 2022-08-13 DIAGNOSIS — N179 Acute kidney failure, unspecified: Secondary | ICD-10-CM | POA: Diagnosis not present

## 2022-08-13 DIAGNOSIS — T8143XA Infection following a procedure, organ and space surgical site, initial encounter: Secondary | ICD-10-CM

## 2022-08-13 LAB — CBC WITH DIFFERENTIAL/PLATELET
Abs Immature Granulocytes: 0.01 10*3/uL (ref 0.00–0.07)
Basophils Absolute: 0 10*3/uL (ref 0.0–0.1)
Basophils Relative: 0 %
Eosinophils Absolute: 0.2 10*3/uL (ref 0.0–0.5)
Eosinophils Relative: 4 %
HCT: 29 % — ABNORMAL LOW (ref 36.0–46.0)
Hemoglobin: 9.1 g/dL — ABNORMAL LOW (ref 12.0–15.0)
Immature Granulocytes: 0 %
Lymphocytes Relative: 10 %
Lymphs Abs: 0.5 10*3/uL — ABNORMAL LOW (ref 0.7–4.0)
MCH: 29.4 pg (ref 26.0–34.0)
MCHC: 31.4 g/dL (ref 30.0–36.0)
MCV: 93.5 fL (ref 80.0–100.0)
Monocytes Absolute: 0.3 10*3/uL (ref 0.1–1.0)
Monocytes Relative: 7 %
Neutro Abs: 3.8 10*3/uL (ref 1.7–7.7)
Neutrophils Relative %: 79 %
Platelets: 144 10*3/uL — ABNORMAL LOW (ref 150–400)
RBC: 3.1 MIL/uL — ABNORMAL LOW (ref 3.87–5.11)
RDW: 18.8 % — ABNORMAL HIGH (ref 11.5–15.5)
WBC: 4.8 10*3/uL (ref 4.0–10.5)
nRBC: 0 % (ref 0.0–0.2)

## 2022-08-13 LAB — GLUCOSE, CAPILLARY
Glucose-Capillary: 136 mg/dL — ABNORMAL HIGH (ref 70–99)
Glucose-Capillary: 198 mg/dL — ABNORMAL HIGH (ref 70–99)
Glucose-Capillary: 226 mg/dL — ABNORMAL HIGH (ref 70–99)
Glucose-Capillary: 44 mg/dL — CL (ref 70–99)
Glucose-Capillary: 66 mg/dL — ABNORMAL LOW (ref 70–99)
Glucose-Capillary: 73 mg/dL (ref 70–99)
Glucose-Capillary: 77 mg/dL (ref 70–99)
Glucose-Capillary: 81 mg/dL (ref 70–99)

## 2022-08-13 LAB — BASIC METABOLIC PANEL
Anion gap: 8 (ref 5–15)
BUN: 26 mg/dL — ABNORMAL HIGH (ref 8–23)
CO2: 30 mmol/L (ref 22–32)
Calcium: 7.8 mg/dL — ABNORMAL LOW (ref 8.9–10.3)
Chloride: 95 mmol/L — ABNORMAL LOW (ref 98–111)
Creatinine, Ser: 0.7 mg/dL (ref 0.44–1.00)
GFR, Estimated: >60 mL/min (ref >60)
Glucose, Bld: 91 mg/dL (ref 70–99)
Potassium: 3.6 mmol/L (ref 3.5–5.1)
Sodium: 133 mmol/L — ABNORMAL LOW (ref 135–145)

## 2022-08-13 MED ORDER — DEXTROSE 50 % IV SOLN
25.0000 g | INTRAVENOUS | Status: AC
  Administered 2022-08-13: 25 g via INTRAVENOUS

## 2022-08-13 MED ORDER — MIDAZOLAM HCL 2 MG/2ML IJ SOLN
INTRAMUSCULAR | Status: AC | PRN
  Administered 2022-08-13 (×2): .5 mg via INTRAVENOUS

## 2022-08-13 MED ORDER — FENTANYL CITRATE (PF) 100 MCG/2ML IJ SOLN
INTRAMUSCULAR | Status: AC | PRN
  Administered 2022-08-13 (×2): 25 ug via INTRAVENOUS

## 2022-08-13 MED ORDER — DEXTROSE 50 % IV SOLN
INTRAVENOUS | Status: AC
  Administered 2022-08-13: 25 g via INTRAVENOUS
  Filled 2022-08-13: qty 50

## 2022-08-13 MED ORDER — CHLORHEXIDINE GLUCONATE CLOTH 2 % EX PADS
6.0000 | MEDICATED_PAD | Freq: Every day | CUTANEOUS | Status: DC
  Administered 2022-08-13 – 2022-08-17 (×5): 6 via TOPICAL

## 2022-08-13 MED ORDER — FENTANYL CITRATE (PF) 100 MCG/2ML IJ SOLN
INTRAMUSCULAR | Status: AC
  Filled 2022-08-13: qty 2

## 2022-08-13 MED ORDER — SODIUM CHLORIDE 0.9% FLUSH
5.0000 mL | Freq: Three times a day (TID) | INTRAVENOUS | Status: DC
  Administered 2022-08-13 – 2022-08-17 (×12): 5 mL

## 2022-08-13 MED ORDER — MIDAZOLAM HCL 2 MG/2ML IJ SOLN
INTRAMUSCULAR | Status: AC
  Filled 2022-08-13: qty 4

## 2022-08-13 MED ORDER — DEXTROSE 50 % IV SOLN
INTRAVENOUS | Status: AC
  Filled 2022-08-13: qty 50

## 2022-08-13 NOTE — H&P (Signed)
Subjective:   CC: lower GI bleed and discharge  HPI:  Sarah [name] is a 76 y.o. female who was consulted by [facility] for issue above.  Symptoms were first noted 1 day ago. Pt with no specific complaints at time of exam.   Past Medical History:  has a past medical history of Arthritis, Cancer ([place]), Diabetes mellitus without complication ([place]), Hyperlipidemia, Hypertension, Hypothyroidism, and IBS (irritable bowel syndrome).  Past Surgical History:  Past Surgical History:  Procedure Laterality Date   ABDOMINAL HYSTERECTOMY     CATARACT EXTRACTION W/PHACO Left 12/21/2016   Procedure: CATARACT EXTRACTION PHACO AND INTRAOCULAR LENS PLACEMENT ([place])  left eye;  Surgeon: Leandrew Koyanagi, MD;  Location: [place];  Service: Ophthalmology;  Laterality: Left;  Diabetic - oral meds   CATARACT EXTRACTION W/PHACO Right 01/25/2017   Procedure: CATARACT EXTRACTION PHACO AND INTRAOCULAR LENS PLACEMENT ([place])  right diabetic;  Surgeon: Leandrew Koyanagi, MD;  Location: [place];  Service: Ophthalmology;  Laterality: Right;  diabetic - oral meds   CHOLECYSTECTOMY     COLONOSCOPY WITH PROPOFOL N/A 10/26/2016   Procedure: COLONOSCOPY WITH PROPOFOL;  Surgeon: Manya Silvas, MD;  Location: [facility] END[facility];  Service: Endoscopy;  Laterality: N/A;   IR RADIOLOGIST EVAL & MGMT  06/14/2022   KNEE SURGERY Right    LEFT HEART CATH AND CORONARY ANGIOGRAPHY N/A 07/29/2022   Procedure: LEFT HEART CATH AND CORONARY ANGIOGRAPHY;  Surgeon: Yolonda Kida, MD;  Location: [place] [facility];  Service: Cardiovascular;  Laterality: N/A;    Family History: not relevant to CC  Social History:  reports that she has never smoked. She has never used smokeless tobacco. She reports that she does not drink alcohol and does not use drugs.  Current Medications:  Prior to Admission medications   Medication Sig Start Date End Date Taking? Authorizing Provider  amiodarone (PACERONE) 200 MG tablet Take  1 tablet (200 mg total) by mouth daily. 08/02/22  Yes Annita Brod, MD  ascorbic acid (VITAMIN C) 500 MG tablet Take 1 tablet (500 mg total) by mouth 2 (two) times daily. 08/01/22  Yes Annita Brod, MD  aspirin 81 MG chewable tablet Chew 1 tablet (81 mg total) by mouth daily. 08/02/22  Yes Annita Brod, MD  atorvastatin (LIPITOR) 40 MG tablet Take 1 tablet (40 mg total) by mouth at bedtime. 08/01/22  Yes Annita Brod, MD  cyanocobalamin 1000 MCG tablet Take 1,000 mcg by mouth daily.   Yes [provider]  furosemide (LASIX) 40 MG tablet Take 40 mg by mouth daily.   Yes [provider]  gabapentin (NEURONTIN) 100 MG capsule Take 2 capsules (200 mg total) by mouth 2 (two) times daily. 08/01/22  Yes Annita Brod, MD  glimepiride (AMARYL) 4 MG tablet Take 4 mg by mouth daily with breakfast. 09/29/21 09/29/22 Yes [provider]  ipratropium (ATROVENT) 0.02 % nebulizer solution Take 2.5 mLs (0.5 mg total) by nebulization 2 (two) times daily. 08/01/22  Yes Annita Brod, MD  levalbuterol Penne Lash) 0.63 MG/3ML nebulizer solution Take 3 mLs (0.63 mg total) by nebulization 2 (two) times daily. 08/01/22  Yes Annita Brod, MD  levothyroxine (SYNTHROID) 125 MCG tablet Take 1 tablet (125 mcg total) by mouth daily before breakfast. 08/01/22  Yes Annita Brod, MD  lisinopril (ZESTRIL) 2.5 MG tablet Take 1 tablet (2.5 mg total) by mouth daily. 08/02/22  Yes Annita Brod, MD  magnesium oxide (MAG-OX) 400 (240 Mg) MG tablet  Take 400 mg by mouth daily.   Yes [provider]  metoprolol tartrate (LOPRESSOR) 25 MG tablet Take 1 tablet (25 mg total) by mouth 3 (three) times daily. 08/01/22  Yes Annita Brod, MD  Multiple Vitamin (MULTIVITAMIN WITH MINERALS) TABS tablet Take 1 tablet by mouth daily. 08/02/22  Yes Annita Brod, MD  potassium chloride SA (KLOR-CON M) 20 MEQ tablet Take 20 mEq by mouth 2 (two) times daily.   Yes [provider]  predniSONE (DELTASONE) 10 MG tablet Take 10 mg by mouth daily with breakfast.   Yes [provider]  sertraline (ZOLOFT) 100 MG tablet Take 100 mg by mouth daily.   Yes [provider]  spironolactone (ALDACTONE) 25 MG tablet Take 0.5 tablets (12.5 mg total) by mouth daily. 08/02/22  Yes Annita Brod, MD  venlafaxine (EFFEXOR) 37.5 MG tablet Take 37.5 mg by mouth daily.   Yes [provider]  zinc sulfate 220 (50 Zn) MG capsule Take 1 capsule (220 mg total) by mouth daily. 08/02/22  Yes Annita Brod, MD  acetaminophen (TYLENOL) 325 MG tablet Take 2 tablets (650 mg total) by mouth every 4 (four) hours as needed for headache or mild pain. 08/01/22   Annita Brod, MD  feeding supplement (ENSURE ENLIVE / ENSURE PLUS) LIQD Take 237 mLs by mouth 3 (three) times daily between meals. 08/01/22   Annita Brod, MD  ipratropium-albuterol (DUONEB) 0.5-2.5 (3) MG/3ML SOLN Take 3 mLs by nebulization every 6 (six) hours as needed. 08/01/22   Annita Brod, MD  ondansetron (ZOFRAN-ODT) 4 MG disintegrating tablet Take 1 tablet (4 mg total) by mouth every 8 (eight) hours as needed for nausea or vomiting. 06/05/22   Arta Silence, MD  oxyCODONE-acetaminophen (PERCOCET/ROXICET) 5-325 MG tablet Take 1 tablet by mouth every 6 (six) hours as needed for moderate pain. 08/01/22   Annita Brod, MD  simvastatin (ZOCOR) 20 MG tablet Take 20 mg by mouth daily. Patient not taking: Reported on 08/11/2022    [provider]  sodium chloride flush (NS) 0.9 % SOLN 5 mLs by Intracatheter route every 8 (eight) hours. 05/30/22 08/28/22  Benjamine Sprague, DO    Allergies:  Allergies as of 08/11/2022 - Review Complete 08/11/2022  Allergen Reaction Noted   Penicillins Anaphylaxis 06/21/2015   Sulfa antibiotics Rash 10/25/2016      Objective:     BP 123/67 (BP Location: Right Arm)   Pulse 80   Temp 97.6 F (36.4 C)   Resp 20   Ht '5\' 3"'$  (1.6 m)   Wt 55.7 kg   SpO2  100%   BMI 21.75 kg/m   In IR at time of visit.    LABS:     Latest Ref Rng & Units 08/13/2022    6:50 AM 08/12/2022    4:13 AM 08/11/2022    3:05 PM  CMP  Glucose 70 - 99 mg/dL 91  223  398   BUN 8 - 23 mg/dL 26  38  47   Creatinine 0.44 - 1.00 mg/dL 0.70  1.07  1.54   Sodium 135 - 145 mmol/L 133  132  128   Potassium 3.5 - 5.1 mmol/L 3.6  3.8  4.5   Chloride 98 - 111 mmol/L 95  93  89   CO2 22 - 32 mmol/L '30  28  28   '$ Calcium 8.9 - 10.3 mg/dL 7.8  8.4  8.4   Total Protein 6.5 - 8.1 g/dL  5.9  5.9   Total Bilirubin 0.3 - 1.2 mg/dL  0.7  0.8   Alkaline Phos 38 - 126 U/L  63  67   AST 15 - 41 U/L  21  30   ALT 0 - 44 U/L  23  26       Latest Ref Rng & Units 08/13/2022    6:50 AM 08/12/2022    4:13 AM 08/12/2022   12:01 AM  CBC  WBC 4.0 - 10.5 K/uL 4.8  8.8    Hemoglobin 12.0 - 15.0 g/dL 9.1  9.8  10.2   Hematocrit 36.0 - 46.0 % 29.0  30.6  32.6   Platelets 150 - 400 K/uL 144  182      RADS: CLINICAL DATA:  Lower GI bleeding. Bright red blood per rectum. Colostomy.   EXAM: CTA ABDOMEN AND PELVIS WITHOUT AND WITH CONTRAST   TECHNIQUE: Multidetector CT imaging of the abdomen and pelvis was performed using the standard protocol during bolus administration of intravenous contrast. Multiplanar reconstructed images and MIPs were obtained and reviewed to evaluate the vascular anatomy.   RADIATION DOSE REDUCTION: This exam was performed according to the departmental dose-optimization program which includes automated exposure control, adjustment of the mA and/or kV according to patient size and/or use of iterative reconstruction technique.   CONTRAST:  8m OMNIPAQUE IOHEXOL 350 MG/ML SOLN   COMPARISON:  CT abdomen and pelvis 06/14/2022   FINDINGS: VASCULAR   Aorta: Normal caliber aorta without aneurysm, dissection, vasculitis or significant stenosis. Moderate calcified atherosclerotic disease seen throughout the aorta.   Celiac: Patent without evidence of aneurysm,  dissection, vasculitis or significant stenosis.   SMA: Patent without evidence of aneurysm, dissection, vasculitis or significant stenosis.   Renals: Both renal arteries are patent without evidence of aneurysm, dissection, vasculitis, fibromuscular dysplasia or significant stenosis. Accessory left renal artery present.   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 Artery is otherwise widely patent.   Inflow: Patent without evidence of aneurysm, dissection, vasculitis or significant stenosis.   Proximal Outflow: Bilateral common femoral and visualized portions of the superficial and profunda femoral arteries are patent without evidence of aneurysm, dissection, vasculitis or significant stenosis.   Veins: No obvious venous abnormality within the limitations of this arterial phase study.   Review of the MIP images confirms the above findings.   NON-VASCULAR   Lower chest: There are small bilateral pleural effusions with compressive atelectasis in the lung bases, left greater than right.   Hepatobiliary: No focal liver abnormality is seen. Status post cholecystectomy. No biliary dilatation.   Pancreas: Unremarkable. No pancreatic ductal dilatation or surrounding inflammatory changes.   Spleen: Normal in size without focal abnormality.   Adrenals/Urinary Tract: The bladder is markedly distended. There is mild bilateral hydronephrosis without obstructing calculi identified. No perinephric stranding or fluid collection. Single 4 mm nonobstructing right renal calculus. Adrenal glands are within normal limits.   Stomach/Bowel: There is a new left lower quadrant colostomy. The colon proximal to the colostomy demonstrates mild wall thickening (descending colon) without pneumatosis. Small bowel and stomach are within normal limits.   The distal sigmoid colon and rectum are fluid distended, but otherwise  unremarkable. There are several air-fluid collections in the pelvis which may or may not be bowel loops. Evaluation is limited secondary to lack of oral contrast in this region. The largest air-fluid collection is in the left lower quadrant measuring 6.8 x 5.6 by 5.2 cm (  image 10/172). This region is suspicious for abscess and abuts the sigmoid colon staple line image 7/176.   There is a new enhancing air-fluid collection in the left paracolic gutter worrisome for abscess measuring 3.3 by 3.7 by 10.8 cm image 10/104.   No active gastrointestinal bleeding identified.   Lymphatic: No enlarged lymph nodes are seen.   Reproductive: Uterus is not visualized.   Other: There is no ascites. There is new diffuse body wall edema. Small fat containing umbilical hernia is again seen. There is a new midline abdominal wall wound without discrete focal fluid collection. There is a new left-sided ostomy which appears within normal limits. No free intraperitoneal air.   Musculoskeletal: Degenerative changes affect the spine.   IMPRESSION: VASCULAR   1. No active gastrointestinal bleeding identified. 2. No evidence for acute arterial abnormality. 3.  Aortic Atherosclerosis (ICD10-I70.0).   NON-VASCULAR   1. New left lower quadrant colostomy present. No evidence for bowel obstruction. Mild wall thickening of the descending colon proximal to the ostomy may represent nonspecific colitis. 2. New air-fluid collection in the left paracolic gutter worrisome for abscess measuring 3.3 x 3.7 x 10.8 cm. 3. Findings suspicious for air-fluid collection abutting the sigmoid colon staple line measuring 6.8 x 5.6 x 5.2 cm. Findings may represent bowel leak and/or abscess. 4. Mild bilateral hydronephrosis likely secondary to markedly distended bladder. Recommend clinical correlation and follow-up. 5. Diffuse body wall edema. 6. Nonobstructing right renal calculus. 7. Small pleural effusions.      Electronically Signed   By: Ronney Asters M.D.   On: 08/11/2022 16:20 Assessment:   Lower GI bleed/discharge  S/p hartmans for perforated diverticulitis and COLON CA  New pseudomonas infection of midline open wound from recent surgery  DM, Sacral wound, afib, depression, recent NSTEMI, CHF, CAD, AKI, hypothyroidism, hyponatremia    Plan:    in IR for drain placement for possible drain placement for the fluid collections noted on CT scan.   The discharge from her rectal stump could potentially be from 1 of these fluid collections.  With the recent diagnosis of colon cancer, we will need to discuss with them possibility of seeding even though the past showed complete resection of the cancer.  N.p.o. after midnight.  Continue antibiotics in the meantime.  We will start Dakin's soaked wet-to-dry dressings for the midline Pseudomonas infection.  Continue to monitor with daily dressing changes.  Also discussed with oncology if inpatient consultation will be appropriate for the recent diagnosis of colon cancer.  Hospitalist consulted for management of chronic issues listed above.  Appreciate their recommendations.   labs/images/medications/previous chart entries reviewed personally and relevant changes/updates noted above.

## 2022-08-13 NOTE — Progress Notes (Addendum)
Progress Note    ALYZABETH [name]  JKK:[id number] DOB: Jan 18, 1946  DOA: 08/11/2022 PCP: Rusty Aus, MD      Brief Narrative:    Medical records reviewed and are as summarized below:  Sarah [name] is a 76 y.o. female with past medical history significant for Graves' disease status post radiation therapy, diabetes mellitus on oral agents, hypertension, history of diverticulitis with abscess formation status post CT-guided drain placement, recent hospitalization and status post partial sigmoidectomy with Hartman's pouch for complicated diverticulitis, history of paroxysmal A-fib, postoperative non-ST elevation MI with left heart cath showing an LVEF of 25% with global hypokinesis and triple-vessel disease, chronic systolic CHF, protein calorie malnutrition who was recently discharged to skilled nursing facility for subacute rehab.  She was brought back to the hospital on 08/11/2022 for evaluation of bright red blood per rectum.  However, there was no blood in the stool from her colostomy bag.  She is on aspirin for CAD but she is not on long-term anticoagulation for atrial fibrillation.  She was found to have intra-abdominal abscess.     Assessment/Plan:   Principal Problem:   Rectal bleeding Active Problems:   Protein-calorie malnutrition, severe   Uncontrolled type 2 diabetes mellitus with hyperglycemia, without long-term current use of insulin (HCC)   Pressure injury of skin   Hyponatremia   Hypothyroidism   AKI (acute kidney injury) ([place])   CAD (coronary artery disease)   Chronic systolic CHF (congestive heart failure) (HCC)   Intra-abdominal abscess post-procedure   Depression   Paroxysmal atrial fibrillation (HCC)   Hypoglycemia   Hypotension    Body mass index is 21.75 kg/m.   Midline abdominal Pseudomonas open wound infection from recent surgery, intra-abdominal abscess, s/p Hartman's procedure for perforated diverticulitis and colon cancer on 07/23/2022: S/p  placement of 10 French drain in pelvic abscess with return of purulent fluid.  Continue IV ciprofloxacin and Flagyl.  Follow-up with general surgeon.  Rectal bleeding/rectal discharge: No active rectal bleeding or discharge on inspection  AKI and hyponatremia: Improved.  Lisinopril, Aldactone, Lasix have been held.  Acute metabolic encephalopathy: Resolved.   Type II DM with hyperglycemia, hypoglycemia: She had recurrent hypoglycemia last night so she was placed on IV dextrose infusion.  Glucose levels appears to have stabilized.  She will be started on a diet since she has completed current placement.   Hypotension: Improved.  Chronic systolic CHF, CAD: Hold Lasix and Aldactone for now and probably resume tomorrow.  Paroxysmal atrial fibrillation: Continue amiodarone and metoprolol as able  Stage II sacral decubitus ulcer: Continue local wound care  Other comorbidities include hypothyroidism, depression     Diet Order             Diet 2 gram sodium Room service appropriate? Yes; Fluid consistency: Thin  Diet effective now                            Consultants: General surgeon  Procedures: S/p CT-guided placement of 10 French drain into pelvic abscess on 08/13/2022    Medications:    amiodarone  200 mg Oral Daily   atorvastatin  40 mg Oral QHS   dextrose       feeding supplement  237 mL Oral TID BM   fentaNYL       ipratropium  0.5 mg Nebulization BID   leptospermum manuka honey  1 Application Topical Daily   levothyroxine  125 mcg  Oral QAC breakfast   metoprolol tartrate  25 mg Oral TID   midazolam       sertraline  100 mg Oral Daily   sodium chloride flush  5 mL Intracatheter Q8H   sodium hypochlorite   Irrigation Daily   venlafaxine  37.5 mg Oral Daily   Continuous Infusions:  ciprofloxacin 400 mg (08/13/22 1240)   And   metronidazole 500 mg (08/13/22 0829)     Anti-infectives (From admission, onward)    Start     Dose/Rate Route  Frequency Ordered Stop   08/12/22 2000  metroNIDAZOLE (FLAGYL) IVPB 500 mg       See Hyperspace for full Linked Orders Report.   500 mg 100 mL/hr over 60 Minutes Intravenous Every 12 hours 08/12/22 0902 08/18/22 1959   08/12/22 1000  ciprofloxacin (CIPRO) IVPB 400 mg       See Hyperspace for full Linked Orders Report.   400 mg 200 mL/hr over 60 Minutes Intravenous Every 12 hours 08/12/22 0902     08/11/22 2000  metroNIDAZOLE (FLAGYL) IVPB 500 mg  Status:  Discontinued       See Hyperspace for full Linked Orders Report.   500 mg 100 mL/hr over 60 Minutes Intravenous Every 12 hours 08/11/22 1712 08/12/22 0902   08/11/22 1900  ciprofloxacin (CIPRO) IVPB 400 mg  Status:  Discontinued       See Hyperspace for full Linked Orders Report.   400 mg 200 mL/hr over 60 Minutes Intravenous Every 24 hours 08/11/22 1712 08/12/22 0902              Family Communication/Anticipated D/C date and plan/Code Status   DVT prophylaxis: SCDs Start: 08/11/22 1713     Code Status: DNR  Family Communication: None Disposition Plan: To be determined by surgical surgeon         Subjective:   Interval events noted.  She has no complaints.  No abdominal pain or vomiting  Objective:    Vitals:   08/13/22 1145 08/13/22 1202 08/13/22 1217 08/13/22 1218  BP: (!) 109/56 118/64 110/68 111/61  Pulse: 82 81 81 76  Resp: '19 19 16 16  '$ Temp:    98.1 F (36.7 C)  TempSrc:    Oral  SpO2: 100% 100% 98% 98%  Weight:      Height:       No data found.   Intake/Output Summary (Last 24 hours) at 08/13/2022 1247 Last data filed at 08/13/2022 1100 Gross per 24 hour  Intake --  Output 1300 ml  Net -1300 ml   Filed Weights   08/11/22 1502  Weight: 55.7 kg    Exam:  GEN: NAD SKIN: Warm and dry EYES: EOMI ENT: MMM CV: RRR PULM: CTA B ABD: soft, ND, NT, +BS, + colostomy bag, + dressing on midline incisional wound CNS: AAO x 3, non focal EXT: No edema or tenderness     Data Reviewed:    I have personally reviewed following labs and imaging studies:  Labs: Labs show the following:   Basic Metabolic Panel: Recent Labs  Lab 08/11/22 1505 08/12/22 0413 08/13/22 0650  NA 128* 132* 133*  K 4.5 3.8 3.6  CL 89* 93* 95*  CO2 '28 28 30  '$ GLUCOSE 398* 223* 91  BUN 47* 38* 26*  CREATININE 1.54* 1.07* 0.70  CALCIUM 8.4* 8.4* 7.8*   GFR Estimated Creatinine Clearance: 49.5 mL/min (by C-G formula based on SCr of 0.7 mg/dL). Liver Function Tests: Recent Labs  Lab 08/11/22  1505 08/12/22 0413  AST 30 21  ALT 26 23  ALKPHOS 67 63  BILITOT 0.8 0.7  PROT 5.9* 5.9*  ALBUMIN 2.2* 2.2*   No results for input(s): "LIPASE", "AMYLASE" in the last 168 hours. No results for input(s): "AMMONIA" in the last 168 hours. Coagulation profile Recent Labs  Lab 08/11/22 1511  INR 1.2    CBC: Recent Labs  Lab 08/11/22 1505 08/12/22 0001 08/12/22 0413 08/13/22 0650  WBC 6.1  --  8.8 4.8  NEUTROABS 5.7  --   --  3.8  HGB 9.5* 10.2* 9.8* 9.1*  HCT 29.5* 32.6* 30.6* 29.0*  MCV 90.8  --  91.1 93.5  PLT 148*  --  182 144*   Cardiac Enzymes: No results for input(s): "CKTOTAL", "CKMB", "CKMBINDEX", "TROPONINI" in the last 168 hours. BNP (last 3 results) No results for input(s): "PROBNP" in the last 8760 hours. CBG: Recent Labs  Lab 08/12/22 2019 08/13/22 0005 08/13/22 0351 08/13/22 0416 08/13/22 0823  GLUCAP 140* 73 44* 136* 77   D-Dimer: No results for input(s): "DDIMER" in the last 72 hours. Hgb A1c: No results for input(s): "HGBA1C" in the last 72 hours. Lipid Profile: No results for input(s): "CHOL", "HDL", "LDLCALC", "TRIG", "CHOLHDL", "LDLDIRECT" in the last 72 hours. Thyroid function studies: No results for input(s): "TSH", "T4TOTAL", "T3FREE", "THYROIDAB" in the last 72 hours.  Invalid input(s): "FREET3" Anemia work up: No results for input(s): "VITAMINB12", "FOLATE", "FERRITIN", "TIBC", "IRON", "RETICCTPCT" in the last 72 hours. Sepsis Labs: Recent  Labs  Lab 08/11/22 1505 08/12/22 0413 08/13/22 0650  WBC 6.1 8.8 4.8    Microbiology No results found for this or any previous visit (from the past 240 hour(s)).  Procedures and diagnostic studies:  CT HEAD WO CONTRAST (5MM)  Result Date: 08/12/2022 CLINICAL DATA:  Altered mental status EXAM: CT HEAD WITHOUT CONTRAST TECHNIQUE: Contiguous axial images were obtained from the base of the skull through the vertex without intravenous contrast. RADIATION DOSE REDUCTION: This exam was performed according to the departmental dose-optimization program which includes automated exposure control, adjustment of the mA and/or kV according to patient size and/or use of iterative reconstruction technique. COMPARISON:  09/16/2017 FINDINGS: Brain: No acute intracranial findings are seen. There are no signs of bleeding within the cranium. Cortical sulci are prominent. Ventricles are not dilated. There is subtle decreased density in periventricular white matter and basal ganglia. Vascular: There are scattered arterial calcifications. Skull: Unremarkable. Sinuses/Orbits: Defects in the medial walls of both maxillary sinuses may be postsurgical. Other: None. IMPRESSION: No acute intracranial findings are seen.  Atrophy. Electronically Signed   By: Elmer Picker M.D.   On: 08/12/2022 17:57   CT ANGIO GI BLEED  Result Date: 08/11/2022 CLINICAL DATA:  Lower GI bleeding. Bright red blood per rectum. Colostomy. EXAM: CTA ABDOMEN AND PELVIS WITHOUT AND WITH CONTRAST TECHNIQUE: Multidetector CT imaging of the abdomen and pelvis was performed using the standard protocol during bolus administration of intravenous contrast. Multiplanar reconstructed images and MIPs were obtained and reviewed to evaluate the vascular anatomy. RADIATION DOSE REDUCTION: This exam was performed according to the departmental dose-optimization program which includes automated exposure control, adjustment of the mA and/or kV according to patient  size and/or use of iterative reconstruction technique. CONTRAST:  75m OMNIPAQUE IOHEXOL 350 MG/ML SOLN COMPARISON:  CT abdomen and pelvis 06/14/2022 FINDINGS: VASCULAR Aorta: Normal caliber aorta without aneurysm, dissection, vasculitis or significant stenosis. Moderate calcified atherosclerotic disease seen throughout the aorta. Celiac: Patent without evidence of aneurysm, dissection,  vasculitis or significant stenosis. SMA: Patent without evidence of aneurysm, dissection, vasculitis or significant stenosis. Renals: Both renal arteries are patent without evidence of aneurysm, dissection, vasculitis, fibromuscular dysplasia or significant stenosis. Accessory left renal artery present. 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 Artery is otherwise widely patent. Inflow: Patent without evidence of aneurysm, dissection, vasculitis or significant stenosis. Proximal Outflow: Bilateral common femoral and visualized portions of the superficial and profunda femoral arteries are patent without evidence of aneurysm, dissection, vasculitis or significant stenosis. Veins: No obvious venous abnormality within the limitations of this arterial phase study. Review of the MIP images confirms the above findings. NON-VASCULAR Lower chest: There are small bilateral pleural effusions with compressive atelectasis in the lung bases, left greater than right. Hepatobiliary: No focal liver abnormality is seen. Status post cholecystectomy. No biliary dilatation. Pancreas: Unremarkable. No pancreatic ductal dilatation or surrounding inflammatory changes. Spleen: Normal in size without focal abnormality. Adrenals/Urinary Tract: The bladder is markedly distended. There is mild bilateral hydronephrosis without obstructing calculi identified. No perinephric stranding or fluid collection. Single 4 mm nonobstructing right renal calculus. Adrenal glands are within normal limits.  Stomach/Bowel: There is a new left lower quadrant colostomy. The colon proximal to the colostomy demonstrates mild wall thickening (descending colon) without pneumatosis. Small bowel and stomach are within normal limits. The distal sigmoid colon and rectum are fluid distended, but otherwise unremarkable. There are several air-fluid collections in the pelvis which may or may not be bowel loops. Evaluation is limited secondary to lack of oral contrast in this region. The largest air-fluid collection is in the left lower quadrant measuring 6.8 x 5.6 by 5.2 cm (image 10/172). This region is suspicious for abscess and abuts the sigmoid colon staple line image 7/176. There is a new enhancing air-fluid collection in the left paracolic gutter worrisome for abscess measuring 3.3 by 3.7 by 10.8 cm image 10/104. No active gastrointestinal bleeding identified. Lymphatic: No enlarged lymph nodes are seen. Reproductive: Uterus is not visualized. Other: There is no ascites. There is new diffuse body wall edema. Small fat containing umbilical hernia is again seen. There is a new midline abdominal wall wound without discrete focal fluid collection. There is a new left-sided ostomy which appears within normal limits. No free intraperitoneal air. Musculoskeletal: Degenerative changes affect the spine. IMPRESSION: VASCULAR 1. No active gastrointestinal bleeding identified. 2. No evidence for acute arterial abnormality. 3.  Aortic Atherosclerosis (ICD10-I70.0). NON-VASCULAR 1. New left lower quadrant colostomy present. No evidence for bowel obstruction. Mild wall thickening of the descending colon proximal to the ostomy may represent nonspecific colitis. 2. New air-fluid collection in the left paracolic gutter worrisome for abscess measuring 3.3 x 3.7 x 10.8 cm. 3. Findings suspicious for air-fluid collection abutting the sigmoid colon staple line measuring 6.8 x 5.6 x 5.2 cm. Findings may represent bowel leak and/or abscess. 4. Mild  bilateral hydronephrosis likely secondary to markedly distended bladder. Recommend clinical correlation and follow-up. 5. Diffuse body wall edema. 6. Nonobstructing right renal calculus. 7. Small pleural effusions. Electronically Signed   By: Ronney Asters M.D.   On: 08/11/2022 16:20               LOS: 2 days   Magdalena Skilton  [facility]   Pager on [email or web address]. If 7PM-7AM, please contact night-coverage at [email or web address]     08/13/2022, 12:47 PM

## 2022-08-13 NOTE — Procedures (Signed)
Interventional Radiology Procedure Note  Procedure: CT Guided Drainage of left abdominal and pelvic peritoneal abscesses  Complications: None  Estimated Blood Loss: < 10 mL  Findings: 10 Fr drain placed in left lateral paracolic abscess with return of purulent fluid. Fluid sample sent for culture analysis. Drain attached to suction bulb drainage.  10 Fr drain placed in pelvic abscess with return of bloody fluid and debris. Fluid sample sent for culture analysis. Drain attached to suction bulb drainage.  Will follow.  Venetia Night. Kathlene Cote, M.D Pager:  [phone]

## 2022-08-13 NOTE — Plan of Care (Signed)
  Problem: Activity: Goal: Ability to return to baseline activity level will improve Outcome: Progressing   

## 2022-08-14 DIAGNOSIS — K625 Hemorrhage of anus and rectum: Secondary | ICD-10-CM | POA: Diagnosis not present

## 2022-08-14 DIAGNOSIS — C187 Malignant neoplasm of sigmoid colon: Secondary | ICD-10-CM | POA: Diagnosis not present

## 2022-08-14 DIAGNOSIS — N179 Acute kidney failure, unspecified: Secondary | ICD-10-CM | POA: Diagnosis not present

## 2022-08-14 DIAGNOSIS — E162 Hypoglycemia, unspecified: Secondary | ICD-10-CM | POA: Diagnosis not present

## 2022-08-14 DIAGNOSIS — T8143XA Infection following a procedure, organ and space surgical site, initial encounter: Secondary | ICD-10-CM | POA: Diagnosis not present

## 2022-08-14 DIAGNOSIS — I5022 Chronic systolic (congestive) heart failure: Secondary | ICD-10-CM | POA: Diagnosis not present

## 2022-08-14 DIAGNOSIS — E871 Hypo-osmolality and hyponatremia: Secondary | ICD-10-CM | POA: Diagnosis not present

## 2022-08-14 LAB — GLUCOSE, CAPILLARY
Glucose-Capillary: 249 mg/dL — ABNORMAL HIGH (ref 70–99)
Glucose-Capillary: 230 mg/dL — ABNORMAL HIGH (ref 70–99)
Glucose-Capillary: 205 mg/dL — ABNORMAL HIGH (ref 70–99)
Glucose-Capillary: 289 mg/dL — ABNORMAL HIGH (ref 70–99)

## 2022-08-14 MED ORDER — SODIUM CHLORIDE 0.9 % IV SOLN
2.0000 g | Freq: Two times a day (BID) | INTRAVENOUS | Status: DC
  Administered 2022-08-14 – 2022-08-17 (×7): 2 g via INTRAVENOUS
  Filled 2022-08-14: qty 12.5
  Filled 2022-08-14: qty 2
  Filled 2022-08-14: qty 12.5
  Filled 2022-08-14: qty 2
  Filled 2022-08-14 (×2): qty 12.5
  Filled 2022-08-14 (×2): qty 2

## 2022-08-14 MED ORDER — INSULIN ASPART 100 UNIT/ML IJ SOLN
0.0000 [IU] | Freq: Three times a day (TID) | INTRAMUSCULAR | Status: DC
  Administered 2022-08-14 – 2022-08-15 (×3): 3 [IU] via SUBCUTANEOUS
  Administered 2022-08-15: 7 [IU] via SUBCUTANEOUS
  Administered 2022-08-16 (×2): 1 [IU] via SUBCUTANEOUS
  Administered 2022-08-16: 3 [IU] via SUBCUTANEOUS
  Filled 2022-08-14 (×7): qty 1

## 2022-08-14 MED ORDER — ENOXAPARIN SODIUM 40 MG/0.4ML IJ SOSY
40.0000 mg | PREFILLED_SYRINGE | INTRAMUSCULAR | Status: DC
  Administered 2022-08-14: 40 mg via SUBCUTANEOUS
  Filled 2022-08-14: qty 0.4

## 2022-08-14 MED ORDER — FUROSEMIDE 40 MG PO TABS
40.0000 mg | ORAL_TABLET | Freq: Every day | ORAL | Status: DC
  Administered 2022-08-14 – 2022-08-17 (×4): 40 mg via ORAL
  Filled 2022-08-14 (×4): qty 1

## 2022-08-14 NOTE — H&P (Signed)
Subjective:   CC: lower GI bleed and discharge  HPI:  Sarah [name] is a 76 y.o. female who was consulted by [facility] for issue above.  Symptoms were first noted 1 day ago. Pt with no specific complaints at time of exam.   Past Medical History:  has a past medical history of Arthritis, Cancer ([place]), Diabetes mellitus without complication ([place]), Hyperlipidemia, Hypertension, Hypothyroidism, and IBS (irritable bowel syndrome).  Past Surgical History:  Past Surgical History:  Procedure Laterality Date   ABDOMINAL HYSTERECTOMY     CATARACT EXTRACTION W/PHACO Left 12/21/2016   Procedure: CATARACT EXTRACTION PHACO AND INTRAOCULAR LENS PLACEMENT ([place])  left eye;  Surgeon: Leandrew Koyanagi, MD;  Location: [place];  Service: Ophthalmology;  Laterality: Left;  Diabetic - oral meds   CATARACT EXTRACTION W/PHACO Right 01/25/2017   Procedure: CATARACT EXTRACTION PHACO AND INTRAOCULAR LENS PLACEMENT ([place])  right diabetic;  Surgeon: Leandrew Koyanagi, MD;  Location: [place];  Service: Ophthalmology;  Laterality: Right;  diabetic - oral meds   CHOLECYSTECTOMY     COLONOSCOPY WITH PROPOFOL N/A 10/26/2016   Procedure: COLONOSCOPY WITH PROPOFOL;  Surgeon: Manya Silvas, MD;  Location: [facility] ENDOSCOPY;  Service: Endoscopy;  Laterality: N/A;   IR RADIOLOGIST EVAL & MGMT  06/14/2022   KNEE SURGERY Right    LEFT HEART CATH AND CORONARY ANGIOGRAPHY N/A 07/29/2022   Procedure: LEFT HEART CATH AND CORONARY ANGIOGRAPHY;  Surgeon: Yolonda Kida, MD;  Location: [place] CV LAB;  Service: Cardiovascular;  Laterality: N/A;    Family History: not relevant to CC  Social History:  reports that she has never smoked. She has never used smokeless tobacco. She reports that she does not drink alcohol and does not use drugs.  Current Medications:  Prior to Admission medications   Medication Sig Start Date End Date Taking? Authorizing Provider  amiodarone (PACERONE) 200 MG tablet Take  1 tablet (200 mg total) by mouth daily. 08/02/22  Yes Annita Brod, MD  ascorbic acid (VITAMIN C) 500 MG tablet Take 1 tablet (500 mg total) by mouth 2 (two) times daily. 08/01/22  Yes Annita Brod, MD  aspirin 81 MG chewable tablet Chew 1 tablet (81 mg total) by mouth daily. 08/02/22  Yes Annita Brod, MD  atorvastatin (LIPITOR) 40 MG tablet Take 1 tablet (40 mg total) by mouth at bedtime. 08/01/22  Yes Annita Brod, MD  cyanocobalamin 1000 MCG tablet Take 1,000 mcg by mouth daily.   Yes [provider]  furosemide (LASIX) 40 MG tablet Take 40 mg by mouth daily.   Yes [provider]  gabapentin (NEURONTIN) 100 MG capsule Take 2 capsules (200 mg total) by mouth 2 (two) times daily. 08/01/22  Yes Annita Brod, MD  glimepiride (AMARYL) 4 MG tablet Take 4 mg by mouth daily with breakfast. 09/29/21 09/29/22 Yes [provider]  ipratropium (ATROVENT) 0.02 % nebulizer solution Take 2.5 mLs (0.5 mg total) by nebulization 2 (two) times daily. 08/01/22  Yes Annita Brod, MD  levalbuterol Penne Lash) 0.63 MG/3ML nebulizer solution Take 3 mLs (0.63 mg total) by nebulization 2 (two) times daily. 08/01/22  Yes Annita Brod, MD  levothyroxine (SYNTHROID) 125 MCG tablet Take 1 tablet (125 mcg total) by mouth daily before breakfast. 08/01/22  Yes Annita Brod, MD  lisinopril (ZESTRIL) 2.5 MG tablet Take 1 tablet (2.5 mg total) by mouth daily. 08/02/22  Yes Annita Brod, MD  magnesium oxide (MAG-OX) 400 (240 Mg) MG tablet  Take 400 mg by mouth daily.   Yes [provider]  metoprolol tartrate (LOPRESSOR) 25 MG tablet Take 1 tablet (25 mg total) by mouth 3 (three) times daily. 08/01/22  Yes Annita Brod, MD  Multiple Vitamin (MULTIVITAMIN WITH MINERALS) TABS tablet Take 1 tablet by mouth daily. 08/02/22  Yes Annita Brod, MD  potassium chloride SA (KLOR-CON M) 20 MEQ tablet Take 20 mEq by mouth 2 (two) times daily.   Yes [provider]  predniSONE (DELTASONE) 10 MG tablet Take 10 mg by mouth daily with breakfast.   Yes [provider]  sertraline (ZOLOFT) 100 MG tablet Take 100 mg by mouth daily.   Yes [provider]  spironolactone (ALDACTONE) 25 MG tablet Take 0.5 tablets (12.5 mg total) by mouth daily. 08/02/22  Yes Annita Brod, MD  venlafaxine (EFFEXOR) 37.5 MG tablet Take 37.5 mg by mouth daily.   Yes [provider]  zinc sulfate 220 (50 Zn) MG capsule Take 1 capsule (220 mg total) by mouth daily. 08/02/22  Yes Annita Brod, MD  acetaminophen (TYLENOL) 325 MG tablet Take 2 tablets (650 mg total) by mouth every 4 (four) hours as needed for headache or mild pain. 08/01/22   Annita Brod, MD  feeding supplement (ENSURE ENLIVE / ENSURE PLUS) LIQD Take 237 mLs by mouth 3 (three) times daily between meals. 08/01/22   Annita Brod, MD  ipratropium-albuterol (DUONEB) 0.5-2.5 (3) MG/3ML SOLN Take 3 mLs by nebulization every 6 (six) hours as needed. 08/01/22   Annita Brod, MD  ondansetron (ZOFRAN-ODT) 4 MG disintegrating tablet Take 1 tablet (4 mg total) by mouth every 8 (eight) hours as needed for nausea or vomiting. 06/05/22   Arta Silence, MD  oxyCODONE-acetaminophen (PERCOCET/ROXICET) 5-325 MG tablet Take 1 tablet by mouth every 6 (six) hours as needed for moderate pain. 08/01/22   Annita Brod, MD  simvastatin (ZOCOR) 20 MG tablet Take 20 mg by mouth daily. Patient not taking: Reported on 08/11/2022    [provider]  sodium chloride flush (NS) 0.9 % SOLN 5 mLs by Intracatheter route every 8 (eight) hours. 05/30/22 08/28/22  Benjamine Sprague, DO    Allergies:  Allergies as of 08/11/2022 - Review Complete 08/11/2022  Allergen Reaction Noted   Penicillins Anaphylaxis 06/21/2015   Sulfa antibiotics Rash 10/25/2016      Objective:     BP 129/70 (BP Location: Left Arm)   Pulse 77   Temp 97.6 F (36.4 C) (Oral)   Resp 16   Ht '5\' 3"'$  (1.6 m)   Wt 55.7 kg    SpO2 93%   BMI 21.75 kg/m   In IR at time of visit.    LABS:     Latest Ref Rng & Units 08/13/2022    6:50 AM 08/12/2022    4:13 AM 08/11/2022    3:05 PM  CMP  Glucose 70 - 99 mg/dL 91  223  398   BUN 8 - 23 mg/dL 26  38  47   Creatinine 0.44 - 1.00 mg/dL 0.70  1.07  1.54   Sodium 135 - 145 mmol/L 133  132  128   Potassium 3.5 - 5.1 mmol/L 3.6  3.8  4.5   Chloride 98 - 111 mmol/L 95  93  89   CO2 22 - 32 mmol/L '30  28  28   '$ Calcium 8.9 - 10.3 mg/dL 7.8  8.4  8.4   Total Protein 6.5 - 8.1  g/dL  5.9  5.9   Total Bilirubin 0.3 - 1.2 mg/dL  0.7  0.8   Alkaline Phos 38 - 126 U/L  63  67   AST 15 - 41 U/L  21  30   ALT 0 - 44 U/L  23  26       Latest Ref Rng & Units 08/13/2022    6:50 AM 08/12/2022    4:13 AM 08/12/2022   12:01 AM  CBC  WBC 4.0 - 10.5 K/uL 4.8  8.8    Hemoglobin 12.0 - 15.0 g/dL 9.1  9.8  10.2   Hematocrit 36.0 - 46.0 % 29.0  30.6  32.6   Platelets 150 - 400 K/uL 144  182      RADS: CLINICAL DATA:  Status post Hartmann's procedure for diverticulitis of the sigmoid colon on 07/19/2022. Development postoperative abscesses in the left lateral abdomen adjacent to the descending colon and pelvis to the left of midline.   EXAM: 1. CT GUIDED PERCUTANEOUS CATHETER DRAINAGE OF LEFT ABDOMINAL PERITONEAL ABSCESS 2. CT-GUIDED PERCUTANEOUS CATHETER DRAINAGE OF PELVIC PERITONEAL ABSCESS   ANESTHESIA/SEDATION: Moderate (conscious) sedation was employed during this procedure. A total of Versed 1.0 mg and Fentanyl 50 mcg was administered intravenously by radiology nursing.   Moderate Sedation Time: 45 minutes. The patient's level of consciousness and vital signs were monitored continuously by radiology nursing throughout the procedure under my direct supervision.   PROCEDURE: The procedure, risks, benefits, and alternatives were explained to the patient. Questions regarding the procedure were encouraged and answered. The patient understands and consents to the  procedure. A time out was performed prior to initiating the procedure.   The operative field was prepped with chlorhexidine in a sterile fashion, and a sterile drape was applied covering the operative field. A sterile gown and sterile gloves were used for the procedure. Local anesthesia was provided with 1% Lidocaine.   CT was performed through the abdomen and pelvis in a supine position. Separate sites were marked along the left lateral abdominal wall and anterior lower left abdominal wall at the level of the pelvis.   A 18 gauge trocar needle was advanced to the level of a left-sided peritoneal abscess located adjacent to the descending colon. After aspiration of fluid from the needle, a guidewire was advanced into the collection and the needle removed. The percutaneous tract was dilated over the wire and a 10 French percutaneous drain advanced over the wire. Catheter positioning was confirmed by CT. Additional fluid sample was aspirated from the drain and sent for culture analysis. The drainage catheter was flushed with sterile saline and connected to a suction bulb.   A new 18 gauge trocar needle was then advanced from an anterior approach to the left of midline to the level of a left pelvic peritoneal abscess. After confirming needle tip position, a guidewire was advanced and the needle removed. The percutaneous tract was dilated over the wire and a 10 French percutaneous drainage catheter advanced. A fluid sample was withdrawn from the drain and sent for culture analysis. The drain was flushed with sterile saline and connected to a suction bulb.   Both drainage catheters were secured at the skin with Prolene retention sutures and adhesive retention devices. Dressings were applied at the catheter exit sites.   RADIATION DOSE REDUCTION: This exam was performed according to the departmental dose-optimization program which includes automated exposure control, adjustment of the  mA and/or kV according to patient size and/or use of iterative reconstruction  technique.   COMPLICATIONS: None   FINDINGS: Aspiration at the level of the left lateral pericolic abscess yielded grossly purulent fluid. After placement of a drainage catheter within the collection, there is good output of purulent fluid.   After drain placement into the irregular cavity containing air and fluid in the left pelvis, there was return initially air followed by bloody fluid and debris. After placement of a drainage catheter within this collection there is output of bloody fluid.   IMPRESSION: 1. CT-guided percutaneous catheter drainage of left lateral abdominal abscess yielding purulent fluid. A fluid sample was sent for culture analysis. A 10 French percutaneous drainage catheter was placed and attached to suction bulb drainage. 2. CT-guided percutaneous catheter drainage of pelvic abscess yielding bloody fluid and debris. A fluid sample was sent for culture analysis. A 10 French percutaneous drainage catheter was placed and attached to suction bulb drainage.     Electronically Signed   By: Aletta Edouard M.D.   On: 08/13/2022 14:19 Assessment:   Lower GI bleed/discharge  S/p hartmans for perforated diverticulitis and COLON CA  New pseudomonas infection of midline open wound from recent surgery  DM, Sacral wound, afib, depression, recent NSTEMI, CHF, CAD, AKI, hypothyroidism, hyponatremia  Peritoneal abscesses.   Plan:   Continue drains, Abx.   The discharge from her rectal stump could potentially be from 1 of these fluid collections.  With the recent diagnosis of colon cancer, we will need to discuss with them possibility of seeding even though the past showed complete resection of the cancer.   We will continue Dakin's soaked wet-to-dry dressings for the midline wound infection.  Continue to monitor with daily dressing changes.   Hospitalist consulted for management of  chronic issues listed above.  Appreciate their recommendations.   labs/images/medications/previous chart entries reviewed personally and relevant changes/updates noted above.

## 2022-08-14 NOTE — Progress Notes (Signed)
[facility] Hospital Day(s): 3.   Post op day(s):  Marland Kitchen   Interval History: Patient seen and examined, no acute events or new complaints overnight. Patient reports minimal appetite, denies fevers chills nausea or vomiting..  Review of Systems:  Constitutional: denies fever, chills  HEENT: denies cough or congestion  Respiratory: denies any shortness of breath  Cardiovascular: denies chest pain or palpitations  Genitourinary: denies burning with urination or urinary frequency Musculoskeletal: denies pain, decreased motor or sensation Integumentary: denies any other rashes or skin discolorations Neurological: denies HA or vision/hearing changes   Vital signs in last 24 hours: [min-max] current  Temp:  [97.4 F (36.3 C)-98.4 F (36.9 C)] 97.4 F (36.3 C) (09/17 1614) Pulse Rate:  [74-80] 80 (09/17 1614) Resp:  [16-20] 16 (09/17 1614) BP: (108-132)/(56-70) 132/70 (09/17 1614) SpO2:  [93 %-100 %] 94 % (09/17 1614)     Height: '5\' 3"'$  (160 cm) Weight: 55.7 kg BMI (Calculated): 21.76   Intake/Output last 2 shifts:  09/16 0701 - 09/17 0700 In: 1165.1 [I.V.:255.1; IV Piggyback:900] Out: 9735 [Urine:1300; Drains:150; Stool:225]   Physical Exam:  Constitutional: alert, cooperative and no distress   Respiratory: breathing non-labored at rest  Cardiovascular: regular rate and sinus rhythm  Gastrointestinal: soft, non-tender, and non-distended.  Functioning left lower quadrant colostomy.  Has 1 drain below/inferior and 1 drain lateral and superior.  The inferior drain had been primarily serosanguineous yesterday, is currently significantly more purulent today.  The upper drain has diminished in its purulent output looking more serous. Midline wound is open, and clean without significant discharge.  There is some slough on the right side of the periumbilical area. Musculoskeletal: UE and LE FROM, no edema or wounds, motor and sensation grossly intact, NT     Labs:     Latest Ref Rng & Units 08/13/2022    6:50 AM 08/12/2022    4:13 AM 08/12/2022   12:01 AM  CBC  WBC 4.0 - 10.5 K/uL 4.8  8.8    Hemoglobin 12.0 - 15.0 g/dL 9.1  9.8  10.2   Hematocrit 36.0 - 46.0 % 29.0  30.6  32.6   Platelets 150 - 400 K/uL 144  182        Latest Ref Rng & Units 08/13/2022    6:50 AM 08/12/2022    4:13 AM 08/11/2022    3:05 PM  CMP  Glucose 70 - 99 mg/dL 91  223  398   BUN 8 - 23 mg/dL 26  38  47   Creatinine 0.44 - 1.00 mg/dL 0.70  1.07  1.54   Sodium 135 - 145 mmol/L 133  132  128   Potassium 3.5 - 5.1 mmol/L 3.6  3.8  4.5   Chloride 98 - 111 mmol/L 95  93  89   CO2 22 - 32 mmol/L '30  28  28   '$ Calcium 8.9 - 10.3 mg/dL 7.8  8.4  8.4   Total Protein 6.5 - 8.1 g/dL  5.9  5.9   Total Bilirubin 0.3 - 1.2 mg/dL  0.7  0.8   Alkaline Phos 38 - 126 U/L  63  67   AST 15 - 41 U/L  21  30   ALT 0 - 44 U/L  23  26      Imaging studies: No new pertinent imaging studies   Assessment/Plan:  76 y.o. female with intra-abdominal abscesses   s/p Hartman's procedure, complicated by pertinent comorbidities including: Patient  Active Problem List   Diagnosis Date Noted   Hypoglycemia 08/12/2022   Hypotension 08/12/2022   Rectal bleeding 08/11/2022   AKI (acute kidney injury) ([place]) 08/11/2022   CAD (coronary artery disease) 09/81/1914   Chronic systolic CHF (congestive heart failure) ([place]) 08/11/2022   Intra-abdominal abscess post-procedure 08/11/2022   Depression 08/11/2022   Paroxysmal atrial fibrillation ([place]) 08/11/2022   Hypothyroidism    Protein-calorie malnutrition, severe 07/23/2022   Pressure injury of skin 07/23/2022   NSTEMI (non-ST elevated myocardial infarction) ([place]) 78/29/5621   Acute systolic CHF (congestive heart failure) ([place]) 07/23/2022   Atrial fibrillation with RVR ([place]) 07/23/2022   Electrolyte abnormality including hyponatremia, hypokalemia, hypophosphatemia and hypomagnesemia 07/23/2022   Hyponatremia 07/23/2022   Uncontrolled type  2 diabetes mellitus with hyperglycemia, without long-term current use of insulin ([place]) 07/23/2022   Hypokalemia 07/23/2022   Hypomagnesemia 07/23/2022   Diverticulitis 07/19/2022   Diverticulitis large intestine 05/28/2022     -Continue drains to bulb suction.  -Continue cefepime.  -Midline wound care with Dakin's solution wet to moist.  -We will follow with you.  All of the above findings and recommendations were discussed with the patient, patient's family, and the medical team, and all of patient's and family's questions were answered to their expressed satisfaction.   -- Ronny Bacon M.D., [facility] 08/14/2022 7:32 PM

## 2022-08-14 NOTE — TOC Initial Note (Signed)
Transition of Care [facility]) - Initial/Assessment Note    Patient Details  Name: Sarah [name] MRN: [id number] Date of Birth: 05-Mar-1946  Transition of Care [facility]) CM/SW Contact:    Valente David, RN Phone Number: 08/14/2022, 3:28 PM  Clinical Narrative:                  Patient was at [facility] for short term rehab prior to admission.  Spoke with patient, state she is unaware of what her discharge plan is but is ok with returning to [facility] if needed.  Will request new PT evaluation for recommendations.   Expected Discharge Plan: Skilled Nursing Facility Barriers to Discharge: Continued Medical Work up   Patient Goals and CMS Choice Patient states their goals for this hospitalization and ongoing recovery are:: SNF for rehab CMS Medicare.gov Compare Post Acute Care list provided to:: Patient Choice offered to / list presented to : Patient  Expected Discharge Plan and Services Expected Discharge Plan: [place] Choice: [place] arrangements for the past 2 months: Single Family Home                                      Prior Living Arrangements/Services Living arrangements for the past 2 months: Single Family Home Lives with:: Spouse Patient language and need for interpreter reviewed:: Yes Do you feel safe going back to the place where you live?: Yes      Need for Family Participation in Patient Care: Yes (Comment) Care giver support system in place?: Yes (comment)   Criminal Activity/Legal Involvement Pertinent to Current Situation/Hospitalization: No - Comment as needed  Activities of Daily Living Home Assistive Devices/Equipment: Walker (specify type), Eyeglasses ADL Screening (condition at time of admission) Patient's cognitive ability adequate to safely complete daily activities?: Yes Is the patient deaf or have difficulty hearing?: No Does the patient have difficulty seeing, even when wearing glasses/contacts?:  No Does the patient have difficulty concentrating, remembering, or making decisions?: No Patient able to express need for assistance with ADLs?: Yes Does the patient have difficulty dressing or bathing?: Yes Independently performs ADLs?: Yes (appropriate for developmental age) Does the patient have difficulty walking or climbing stairs?: Yes Weakness of Legs: Both Weakness of Arms/Hands: Both  Permission Sought/Granted Permission sought to share information with : Case Manager, Customer service manager Permission granted to share information with : Yes, Verbal Permission Granted     Permission granted to share info w AGENCY: Skilled Nursing Facilities        Emotional Assessment   Attitude/Demeanor/Rapport: Engaged Affect (typically observed): Appropriate Orientation: : Oriented to Self, Oriented to Place, Oriented to  Time, Oriented to Situation   Psych Involvement: No (comment)  Admission diagnosis:  Rectal bleeding [K62.5] Patient Active Problem List   Diagnosis Date Noted   Hypoglycemia 08/12/2022   Hypotension 08/12/2022   Rectal bleeding 08/11/2022   AKI (acute kidney injury) ([place]) 08/11/2022   CAD (coronary artery disease) 45/80/9983   Chronic systolic CHF (congestive heart failure) ([place]) 08/11/2022   Intra-abdominal abscess post-procedure 08/11/2022   Depression 08/11/2022   Paroxysmal atrial fibrillation ([place]) 08/11/2022   Hypothyroidism    Protein-calorie malnutrition, severe 07/23/2022   Pressure injury of skin 07/23/2022   NSTEMI (non-ST elevated myocardial infarction) ([place]) 38/25/0539   Acute systolic CHF (congestive heart failure) ([place]) 07/23/2022   Atrial fibrillation with RVR ([place]) 07/23/2022  Electrolyte abnormality including hyponatremia, hypokalemia, hypophosphatemia and hypomagnesemia 07/23/2022   Hyponatremia 07/23/2022   Uncontrolled type 2 diabetes mellitus with hyperglycemia, without long-term current use of insulin ([place]) 07/23/2022    Hypokalemia 07/23/2022   Hypomagnesemia 07/23/2022   Diverticulitis 07/19/2022   Diverticulitis large intestine 05/28/2022   PCP:  Rusty Aus, MD Pharmacy:   [facility] [address]), [place] [address] [place] [employer]) [place] [address] Phone: [phone] Fax: [phone]     Social Determinants of Health (SDOH) Interventions    Readmission Risk Interventions    08/14/2022    3:26 PM 07/28/2022    3:27 PM  Readmission Risk Prevention Plan  Transportation Screening Complete Complete  PCP or Specialist Appt within 3-5 Days  Complete  HRI or [place]  Complete  Social Work Consult for [place] Planning/Counseling  Complete  Palliative Care Screening  Complete  Medication Review Press photographer) Complete Complete  PCP or Specialist appointment within 3-5 days of discharge Complete   HRI or [place] Complete   SW Recovery Care/Counseling Consult Complete   [place] Not Applicable

## 2022-08-14 NOTE — Plan of Care (Signed)
  Problem: Activity: Goal: Ability to return to baseline activity level will improve Outcome: Progressing   

## 2022-08-14 NOTE — Progress Notes (Addendum)
Progress Note    Sarah [name]  NGE:[id number] DOB: 04-19-1946  DOA: 08/11/2022 PCP: Rusty Aus, MD      Brief Narrative:    Medical records reviewed and are as summarized below:  Sarah [name] is a 76 y.o. female with past medical history significant for Graves' disease status post radiation therapy, diabetes mellitus on oral agents, hypertension, history of diverticulitis with abscess formation status post CT-guided drain placement, recent hospitalization and status post partial sigmoidectomy with Hartman's pouch for complicated diverticulitis, history of paroxysmal A-fib, postoperative non-ST elevation MI with left heart cath showing an LVEF of 25% with global hypokinesis and triple-vessel disease, chronic systolic CHF, protein calorie malnutrition who was recently discharged to skilled nursing facility for subacute rehab.  She was brought back to the hospital on 08/11/2022 for evaluation of bright red blood per rectum.  However, there was no blood in the stool from her colostomy bag.  She is on aspirin for CAD but she is not on long-term anticoagulation for atrial fibrillation.  She was found to have intra-abdominal abscess.     Assessment/Plan:   Principal Problem:   Rectal bleeding Active Problems:   Protein-calorie malnutrition, severe   Uncontrolled type 2 diabetes mellitus with hyperglycemia, without long-term current use of insulin (HCC)   Pressure injury of skin   Hyponatremia   Hypothyroidism   AKI (acute kidney injury) ([place])   CAD (coronary artery disease)   Chronic systolic CHF (congestive heart failure) (HCC)   Intra-abdominal abscess post-procedure   Depression   Paroxysmal atrial fibrillation (HCC)   Hypoglycemia   Hypotension    Body mass index is 21.75 kg/m.   Midline abdominal Pseudomonas open wound infection from recent surgery, intra-abdominal abscess, s/p Hartman's procedure for perforated diverticulitis and colon cancer on 07/23/2022: S/p  placement of 10 French drain in pelvic abscess with return of purulent fluid on 08/13/2022.  IV ciprofloxacin has been changed to IV cefepime.  Pelvic fluid culture is pending.  Continue oral Flagyl.  Follow-up with general surgeon.    Rectal bleeding/rectal discharge: No active bleeding  AKI and hyponatremia: Improved.  Lisinopril still on hold.  Acute metabolic encephalopathy: Resolved.   Type II DM with hyperglycemia, recent hypoglycemia: Glucose levels are trending upward.  Restart NovoLog as needed for hyperglycemia.  Thrombocytopenia: Platelet count is trending down.  Monitor CBC  Hypotension: Improved.  Chronic systolic CHF, CAD: Restart Lasix today consider restarting Aldactone tomorrow.  Paroxysmal atrial fibrillation: Continue amiodarone and metoprolol as able  Stage II sacral decubitus ulcer: Continue local wound care  Other comorbidities include hypothyroidism, depression     Diet Order             Diet 2 gram sodium Room service appropriate? Yes; Fluid consistency: Thin  Diet effective now                            Consultants: General surgeon  Procedures: S/p CT-guided placement of 10 French drain into pelvic abscess on 08/13/2022    Medications:    amiodarone  200 mg Oral Daily   atorvastatin  40 mg Oral QHS   Chlorhexidine Gluconate Cloth  6 each Topical Daily   enoxaparin (LOVENOX) injection  40 mg Subcutaneous Q24H   feeding supplement  237 mL Oral TID BM   furosemide  40 mg Oral Daily   insulin aspart  0-9 Units Subcutaneous TID WC   ipratropium  0.5 mg  Nebulization BID   leptospermum manuka honey  1 Application Topical Daily   levothyroxine  125 mcg Oral QAC breakfast   metoprolol tartrate  25 mg Oral TID   sertraline  100 mg Oral Daily   sodium chloride flush  5 mL Intracatheter Q8H   sodium hypochlorite   Irrigation Daily   venlafaxine  37.5 mg Oral Daily   Continuous Infusions:  ceFEPime (MAXIPIME) IV 2 g (08/14/22 0949)    metronidazole 500 mg (08/14/22 0832)     Anti-infectives (From admission, onward)    Start     Dose/Rate Route Frequency Ordered Stop   08/14/22 1000  ceFEPIme (MAXIPIME) 2 g in sodium chloride 0.9 % 100 mL IVPB        2 g 200 mL/hr over 30 Minutes Intravenous Every 12 hours 08/14/22 0846     08/12/22 2000  metroNIDAZOLE (FLAGYL) IVPB 500 mg       See Hyperspace for full Linked Orders Report.   500 mg 100 mL/hr over 60 Minutes Intravenous Every 12 hours 08/12/22 0902 08/18/22 1959   08/12/22 1000  ciprofloxacin (CIPRO) IVPB 400 mg  Status:  Discontinued       See Hyperspace for full Linked Orders Report.   400 mg 200 mL/hr over 60 Minutes Intravenous Every 12 hours 08/12/22 0902 08/14/22 0846   08/11/22 2000  metroNIDAZOLE (FLAGYL) IVPB 500 mg  Status:  Discontinued       See Hyperspace for full Linked Orders Report.   500 mg 100 mL/hr over 60 Minutes Intravenous Every 12 hours 08/11/22 1712 08/12/22 0902   08/11/22 1900  ciprofloxacin (CIPRO) IVPB 400 mg  Status:  Discontinued       See Hyperspace for full Linked Orders Report.   400 mg 200 mL/hr over 60 Minutes Intravenous Every 24 hours 08/11/22 1712 08/12/22 0902              Family Communication/Anticipated D/C date and plan/Code Status   DVT prophylaxis: enoxaparin (LOVENOX) injection 40 mg Start: 08/14/22 2200 SCDs Start: 08/11/22 1713     Code Status: DNR  Family Communication: None Disposition Plan: To be determined by surgical surgeon         Subjective:   Interval events noted.  No abdominal pain or vomiting.  Objective:    Vitals:   08/13/22 2100 08/14/22 0430 08/14/22 0749 08/14/22 0754  BP: 118/68 (!) 108/56 129/70   Pulse: 74 78 77   Resp: '18 16 16   '$ Temp: 98.3 F (36.8 C) 97.8 F (36.6 C) 97.6 F (36.4 C)   TempSrc: Oral Oral Oral   SpO2: 98% 94% 97% 93%  Weight:      Height:       No data found.   Intake/Output Summary (Last 24 hours) at 08/14/2022 1340 Last data filed  at 08/14/2022 7829 Gross per 24 hour  Intake 1165.11 ml  Output 610 ml  Net 555.11 ml   Filed Weights   08/11/22 1502  Weight: 55.7 kg    Exam:  GEN: NAD SKIN: No rash EYES: EOMI ENT: MMM CV: RRR PULM: CTA B ABD: soft, ND, NT, +BS, + colostomy bag, dressing on midline incisional wound is clean, dry and intact, + left lower quadrant abdominal drain CNS: AAO x 3, non focal EXT: No edema or tenderness     Data Reviewed:   I have personally reviewed following labs and imaging studies:  Labs: Labs show the following:   Basic Metabolic Panel: Recent Labs  Lab 08/11/22 1505 08/12/22 0413 08/13/22 0650  NA 128* 132* 133*  K 4.5 3.8 3.6  CL 89* 93* 95*  CO2 '28 28 30  '$ GLUCOSE 398* 223* 91  BUN 47* 38* 26*  CREATININE 1.54* 1.07* 0.70  CALCIUM 8.4* 8.4* 7.8*   GFR Estimated Creatinine Clearance: 49.5 mL/min (by C-G formula based on SCr of 0.7 mg/dL). Liver Function Tests: Recent Labs  Lab 08/11/22 1505 08/12/22 0413  AST 30 21  ALT 26 23  ALKPHOS 67 63  BILITOT 0.8 0.7  PROT 5.9* 5.9*  ALBUMIN 2.2* 2.2*   No results for input(s): "LIPASE", "AMYLASE" in the last 168 hours. No results for input(s): "AMMONIA" in the last 168 hours. Coagulation profile Recent Labs  Lab 08/11/22 1511  INR 1.2    CBC: Recent Labs  Lab 08/11/22 1505 08/12/22 0001 08/12/22 0413 08/13/22 0650  WBC 6.1  --  8.8 4.8  NEUTROABS 5.7  --   --  3.8  HGB 9.5* 10.2* 9.8* 9.1*  HCT 29.5* 32.6* 30.6* 29.0*  MCV 90.8  --  91.1 93.5  PLT 148*  --  182 144*   Cardiac Enzymes: No results for input(s): "CKTOTAL", "CKMB", "CKMBINDEX", "TROPONINI" in the last 168 hours. BNP (last 3 results) No results for input(s): "PROBNP" in the last 8760 hours. CBG: Recent Labs  Lab 08/13/22 1305 08/13/22 1655 08/13/22 2111 08/14/22 0745 08/14/22 1137  GLUCAP 81 198* 226* 249* 230*   D-Dimer: No results for input(s): "DDIMER" in the last 72 hours. Hgb A1c: No results for input(s):  "HGBA1C" in the last 72 hours. Lipid Profile: No results for input(s): "CHOL", "HDL", "LDLCALC", "TRIG", "CHOLHDL", "LDLDIRECT" in the last 72 hours. Thyroid function studies: No results for input(s): "TSH", "T4TOTAL", "T3FREE", "THYROIDAB" in the last 72 hours.  Invalid input(s): "FREET3" Anemia work up: No results for input(s): "VITAMINB12", "FOLATE", "FERRITIN", "TIBC", "IRON", "RETICCTPCT" in the last 72 hours. Sepsis Labs: Recent Labs  Lab 08/11/22 1505 08/12/22 0413 08/13/22 0650  WBC 6.1 8.8 4.8    Microbiology Recent Results (from the past 240 hour(s))  Aerobic/Anaerobic Culture w Gram Stain (surgical/deep wound)     Status: None (Preliminary result)   Collection Time: 08/13/22 12:45 PM   Specimen: Abscess  Result Value Ref Range Status   Specimen Description   Final    ABSCESS Performed at [facility], [address] [place], [place] [address]    Special Requests   Final    Normal Performed at [facility], [address] [place], [place] [address]    Gram Stain   Final    MODERATE WBC PRESENT, PREDOMINANTLY MONONUCLEAR FEW GRAM POSITIVE COCCI IN PAIRS FEW GRAM POSITIVE RODS FEW GRAM NEGATIVE RODS Performed at [facility], [address] [place], [place] [address]    Culture PENDING  Incomplete   Report Status PENDING  Incomplete  Aerobic/Anaerobic Culture w Gram Stain (surgical/deep wound)     Status: None (Preliminary result)   Collection Time: 08/13/22 12:46 PM   Specimen: Abscess  Result Value Ref Range Status   Specimen Description   Final    ABSCESS Performed at [facility], [address] [place], [place] [address]    Special Requests   Final    Normal Performed at [facility], [address] [place], [place] [address]    Gram Stain   Final    ABUNDANT WBC PRESENT,BOTH PMN AND MONONUCLEAR NO ORGANISMS SEEN Performed at [facility],  [address], [place], [place] [address]     Culture PENDING  Incomplete   Report Status PENDING  Incomplete    Procedures and diagnostic studies:  CT GUIDED PERITONEAL/RETROPERITONEAL FLUID DRAIN BY PERC CATH  Result Date: 08/13/2022 CLINICAL DATA:  Status post Hartmann's procedure for diverticulitis of the sigmoid colon on 07/19/2022. Development postoperative abscesses in the left lateral abdomen adjacent to the descending colon and pelvis to the left of midline. EXAM: 1. CT GUIDED PERCUTANEOUS CATHETER DRAINAGE OF LEFT ABDOMINAL PERITONEAL ABSCESS 2. CT-GUIDED PERCUTANEOUS CATHETER DRAINAGE OF PELVIC PERITONEAL ABSCESS ANESTHESIA/SEDATION: Moderate (conscious) sedation was employed during this procedure. A total of Versed 1.0 mg and Fentanyl 50 mcg was administered intravenously by radiology nursing. Moderate Sedation Time: 45 minutes. The patient's level of consciousness and vital signs were monitored continuously by radiology nursing throughout the procedure under my direct supervision. PROCEDURE: The procedure, risks, benefits, and alternatives were explained to the patient. Questions regarding the procedure were encouraged and answered. The patient understands and consents to the procedure. A time out was performed prior to initiating the procedure. The operative field was prepped with chlorhexidine in a sterile fashion, and a sterile drape was applied covering the operative field. A sterile gown and sterile gloves were used for the procedure. Local anesthesia was provided with 1% Lidocaine. CT was performed through the abdomen and pelvis in a supine position. Separate sites were marked along the left lateral abdominal wall and anterior lower left abdominal wall at the level of the pelvis. A 18 gauge trocar needle was advanced to the level of a left-sided peritoneal abscess located adjacent to the descending colon. After aspiration of fluid from the needle, a guidewire was advanced into the collection and the needle removed. The percutaneous  tract was dilated over the wire and a 10 French percutaneous drain advanced over the wire. Catheter positioning was confirmed by CT. Additional fluid sample was aspirated from the drain and sent for culture analysis. The drainage catheter was flushed with sterile saline and connected to a suction bulb. A new 18 gauge trocar needle was then advanced from an anterior approach to the left of midline to the level of a left pelvic peritoneal abscess. After confirming needle tip position, a guidewire was advanced and the needle removed. The percutaneous tract was dilated over the wire and a 10 French percutaneous drainage catheter advanced. A fluid sample was withdrawn from the drain and sent for culture analysis. The drain was flushed with sterile saline and connected to a suction bulb. Both drainage catheters were secured at the skin with Prolene retention sutures and adhesive retention devices. Dressings were applied at the catheter exit sites. RADIATION DOSE REDUCTION: This exam was performed according to the departmental dose-optimization program which includes automated exposure control, adjustment of the mA and/or kV according to patient size and/or use of iterative reconstruction technique. COMPLICATIONS: None FINDINGS: Aspiration at the level of the left lateral pericolic abscess yielded grossly purulent fluid. After placement of a drainage catheter within the collection, there is good output of purulent fluid. After drain placement into the irregular cavity containing air and fluid in the left pelvis, there was return initially air followed by bloody fluid and debris. After placement of a drainage catheter within this collection there is output of bloody fluid. IMPRESSION: 1. CT-guided percutaneous catheter drainage of left lateral abdominal abscess yielding purulent fluid. A fluid sample was sent for culture analysis. A 10 French percutaneous drainage catheter was placed and attached to suction bulb  drainage.  2. CT-guided percutaneous catheter drainage of pelvic abscess yielding bloody fluid and debris. A fluid sample was sent for culture analysis. A 10 French percutaneous drainage catheter was placed and attached to suction bulb drainage. Electronically Signed   By: Aletta Edouard M.D.   On: 08/13/2022 14:19   CT GUIDED PERITONEAL/RETROPERITONEAL FLUID DRAIN BY PERC CATH  Result Date: 08/13/2022 CLINICAL DATA:  Status post Hartmann's procedure for diverticulitis of the sigmoid colon on 07/19/2022. Development postoperative abscesses in the left lateral abdomen adjacent to the descending colon and pelvis to the left of midline. EXAM: 1. CT GUIDED PERCUTANEOUS CATHETER DRAINAGE OF LEFT ABDOMINAL PERITONEAL ABSCESS 2. CT-GUIDED PERCUTANEOUS CATHETER DRAINAGE OF PELVIC PERITONEAL ABSCESS ANESTHESIA/SEDATION: Moderate (conscious) sedation was employed during this procedure. A total of Versed 1.0 mg and Fentanyl 50 mcg was administered intravenously by radiology nursing. Moderate Sedation Time: 45 minutes. The patient's level of consciousness and vital signs were monitored continuously by radiology nursing throughout the procedure under my direct supervision. PROCEDURE: The procedure, risks, benefits, and alternatives were explained to the patient. Questions regarding the procedure were encouraged and answered. The patient understands and consents to the procedure. A time out was performed prior to initiating the procedure. The operative field was prepped with chlorhexidine in a sterile fashion, and a sterile drape was applied covering the operative field. A sterile gown and sterile gloves were used for the procedure. Local anesthesia was provided with 1% Lidocaine. CT was performed through the abdomen and pelvis in a supine position. Separate sites were marked along the left lateral abdominal wall and anterior lower left abdominal wall at the level of the pelvis. A 18 gauge trocar needle was advanced to the level of a  left-sided peritoneal abscess located adjacent to the descending colon. After aspiration of fluid from the needle, a guidewire was advanced into the collection and the needle removed. The percutaneous tract was dilated over the wire and a 10 French percutaneous drain advanced over the wire. Catheter positioning was confirmed by CT. Additional fluid sample was aspirated from the drain and sent for culture analysis. The drainage catheter was flushed with sterile saline and connected to a suction bulb. A new 18 gauge trocar needle was then advanced from an anterior approach to the left of midline to the level of a left pelvic peritoneal abscess. After confirming needle tip position, a guidewire was advanced and the needle removed. The percutaneous tract was dilated over the wire and a 10 French percutaneous drainage catheter advanced. A fluid sample was withdrawn from the drain and sent for culture analysis. The drain was flushed with sterile saline and connected to a suction bulb. Both drainage catheters were secured at the skin with Prolene retention sutures and adhesive retention devices. Dressings were applied at the catheter exit sites. RADIATION DOSE REDUCTION: This exam was performed according to the departmental dose-optimization program which includes automated exposure control, adjustment of the mA and/or kV according to patient size and/or use of iterative reconstruction technique. COMPLICATIONS: None FINDINGS: Aspiration at the level of the left lateral pericolic abscess yielded grossly purulent fluid. After placement of a drainage catheter within the collection, there is good output of purulent fluid. After drain placement into the irregular cavity containing air and fluid in the left pelvis, there was return initially air followed by bloody fluid and debris. After placement of a drainage catheter within this collection there is output of bloody fluid. IMPRESSION: 1. CT-guided percutaneous catheter  drainage of left lateral abdominal abscess  yielding purulent fluid. A fluid sample was sent for culture analysis. A 10 French percutaneous drainage catheter was placed and attached to suction bulb drainage. 2. CT-guided percutaneous catheter drainage of pelvic abscess yielding bloody fluid and debris. A fluid sample was sent for culture analysis. A 10 French percutaneous drainage catheter was placed and attached to suction bulb drainage. Electronically Signed   By: Aletta Edouard M.D.   On: 08/13/2022 14:19   CT HEAD WO CONTRAST (5MM)  Result Date: 08/12/2022 CLINICAL DATA:  Altered mental status EXAM: CT HEAD WITHOUT CONTRAST TECHNIQUE: Contiguous axial images were obtained from the base of the skull through the vertex without intravenous contrast. RADIATION DOSE REDUCTION: This exam was performed according to the departmental dose-optimization program which includes automated exposure control, adjustment of the mA and/or kV according to patient size and/or use of iterative reconstruction technique. COMPARISON:  09/16/2017 FINDINGS: Brain: No acute intracranial findings are seen. There are no signs of bleeding within the cranium. Cortical sulci are prominent. Ventricles are not dilated. There is subtle decreased density in periventricular white matter and basal ganglia. Vascular: There are scattered arterial calcifications. Skull: Unremarkable. Sinuses/Orbits: Defects in the medial walls of both maxillary sinuses may be postsurgical. Other: None. IMPRESSION: No acute intracranial findings are seen.  Atrophy. Electronically Signed   By: Elmer Picker M.D.   On: 08/12/2022 17:57               LOS: 3 days   Claudetta Sallie  [facility]   Pager on [email or web address]. If 7PM-7AM, please contact night-coverage at [email or web address]     08/14/2022, 1:40 PM

## 2022-08-15 ENCOUNTER — Encounter: Payer: Self-pay | Admitting: Surgery

## 2022-08-15 ENCOUNTER — Telehealth (HOSPITAL_COMMUNITY): Payer: Self-pay | Admitting: Pharmacy Technician

## 2022-08-15 ENCOUNTER — Other Ambulatory Visit (HOSPITAL_COMMUNITY): Payer: Self-pay

## 2022-08-15 DIAGNOSIS — I5022 Chronic systolic (congestive) heart failure: Secondary | ICD-10-CM | POA: Diagnosis not present

## 2022-08-15 DIAGNOSIS — N179 Acute kidney failure, unspecified: Secondary | ICD-10-CM | POA: Diagnosis not present

## 2022-08-15 DIAGNOSIS — E871 Hypo-osmolality and hyponatremia: Secondary | ICD-10-CM | POA: Diagnosis not present

## 2022-08-15 DIAGNOSIS — K625 Hemorrhage of anus and rectum: Secondary | ICD-10-CM | POA: Diagnosis not present

## 2022-08-15 DIAGNOSIS — I480 Paroxysmal atrial fibrillation: Secondary | ICD-10-CM | POA: Diagnosis not present

## 2022-08-15 LAB — CBC WITH DIFFERENTIAL/PLATELET
Abs Immature Granulocytes: 0.01 10*3/uL (ref 0.00–0.07)
Basophils Absolute: 0 10*3/uL (ref 0.0–0.1)
Basophils Relative: 1 %
Eosinophils Absolute: 0.3 10*3/uL (ref 0.0–0.5)
Eosinophils Relative: 9 %
HCT: 29.2 % — ABNORMAL LOW (ref 36.0–46.0)
Hemoglobin: 9.1 g/dL — ABNORMAL LOW (ref 12.0–15.0)
Immature Granulocytes: 0 %
Lymphocytes Relative: 10 %
Lymphs Abs: 0.4 10*3/uL — ABNORMAL LOW (ref 0.7–4.0)
MCH: 29 pg (ref 26.0–34.0)
MCHC: 31.2 g/dL (ref 30.0–36.0)
MCV: 93 fL (ref 80.0–100.0)
Monocytes Absolute: 0.2 10*3/uL (ref 0.1–1.0)
Monocytes Relative: 6 %
Neutro Abs: 3 10*3/uL (ref 1.7–7.7)
Neutrophils Relative %: 74 %
Platelets: 163 10*3/uL (ref 150–400)
RBC: 3.14 MIL/uL — ABNORMAL LOW (ref 3.87–5.11)
RDW: 18 % — ABNORMAL HIGH (ref 11.5–15.5)
WBC: 4 10*3/uL (ref 4.0–10.5)
nRBC: 0 % (ref 0.0–0.2)

## 2022-08-15 LAB — BASIC METABOLIC PANEL
Anion gap: 7 (ref 5–15)
BUN: 19 mg/dL (ref 8–23)
CO2: 29 mmol/L (ref 22–32)
Calcium: 7.5 mg/dL — ABNORMAL LOW (ref 8.9–10.3)
Chloride: 97 mmol/L — ABNORMAL LOW (ref 98–111)
Creatinine, Ser: 0.65 mg/dL (ref 0.44–1.00)
GFR, Estimated: >60 mL/min (ref >60)
Glucose, Bld: 268 mg/dL — ABNORMAL HIGH (ref 70–99)
Potassium: 3.7 mmol/L (ref 3.5–5.1)
Sodium: 133 mmol/L — ABNORMAL LOW (ref 135–145)

## 2022-08-15 LAB — GLUCOSE, CAPILLARY
Glucose-Capillary: 234 mg/dL — ABNORMAL HIGH (ref 70–99)
Glucose-Capillary: 302 mg/dL — ABNORMAL HIGH (ref 70–99)
Glucose-Capillary: 213 mg/dL — ABNORMAL HIGH (ref 70–99)
Glucose-Capillary: 127 mg/dL — ABNORMAL HIGH (ref 70–99)

## 2022-08-15 MED ORDER — LISINOPRIL 2.5 MG PO TABS
2.5000 mg | ORAL_TABLET | Freq: Every day | ORAL | Status: DC
  Administered 2022-08-15 – 2022-08-17 (×3): 2.5 mg via ORAL
  Filled 2022-08-15 (×3): qty 1

## 2022-08-15 MED ORDER — APIXABAN 2.5 MG PO TABS
2.5000 mg | ORAL_TABLET | Freq: Two times a day (BID) | ORAL | Status: DC
  Administered 2022-08-15 – 2022-08-17 (×4): 2.5 mg via ORAL
  Filled 2022-08-15 (×4): qty 1

## 2022-08-15 MED ORDER — SPIRONOLACTONE 25 MG PO TABS
12.5000 mg | ORAL_TABLET | Freq: Every day | ORAL | Status: DC
  Administered 2022-08-15 – 2022-08-17 (×3): 12.5 mg via ORAL
  Filled 2022-08-15 (×2): qty 0.5
  Filled 2022-08-15: qty 1
  Filled 2022-08-15: qty 0.5
  Filled 2022-08-15: qty 1

## 2022-08-15 MED ORDER — SODIUM CHLORIDE 0.9 % IV SOLN: INTRAVENOUS | Status: DC | PRN

## 2022-08-15 NOTE — Evaluation (Signed)
Physical Therapy Evaluation Patient Details Name: KONYA FAUBLE MRN: [id number] DOB: 08-Oct-1946 Today's Date: 08/15/2022  History of Present Illness  Pt is a 76 y.o. female with past medical history significant for Graves' disease status post radiation therapy, diabetes mellitus on oral agents, hypertension, history of diverticulitis with abscess formation status post CT-guided drain placement, recent hospitalization and status post partial sigmoidectomy with Hartman's pouch for complicated diverticulitis, history of paroxysmal A-fib, postoperative non-ST elevation MI, brought to ED for blood per rectum. She was found to have intra-abdominal abscess.   Clinical Impression  Patient alert, agreeable to mobility with some encouragement but did state several times she is motivated to go home. Per pt prior to these recent hospitalizations she was independent at baseline. Now she reports it has been "a while" since she stood up.  The patient was able to perform log roll tehcnique with minA and step by step cueing, fair sitting balance noted. Sit <> stand with minAx2 and RW. Pt needed encouragement to maximally participate. She was able to take several steps to the recliner with RW and minA as well, exhibited fatigue after activity and further ambulation deferred.  Overall the patient demonstrated deficits (see "PT Problem List") that impede the patient's functional abilities, safety, and mobility and would benefit from skilled PT intervention. Recommendation at this time is SNF due to current level of assistance needed and to maximize function.        Recommendations for follow up therapy are one component of a multi-disciplinary discharge planning process, led by the attending physician.  Recommendations may be updated based on patient status, additional functional criteria and insurance authorization.  Follow Up Recommendations Skilled nursing-short term rehab (<3 hours/day) Can patient physically be  transported by private vehicle: No    Assistance Recommended at Discharge Frequent or constant Supervision/Assistance  Patient can return home with the following  A lot of help with bathing/dressing/bathroom;Help with stairs or ramp for entrance;Assist for transportation;Assistance with cooking/housework;A lot of help with walking and/or transfers    Equipment Recommendations Other (comment) (TBD at next venue of care)  Recommendations for Other Services       Functional Status Assessment Patient has had a recent decline in their functional status and demonstrates the ability to make significant improvements in function in a reasonable and predictable amount of time.     Precautions / Restrictions Precautions Precautions: Fall Precaution Comments: abdominal incision, colostomy, 2 JP drains Restrictions Weight Bearing Restrictions: No      Mobility  Bed Mobility Overal bed mobility: Needs Assistance Bed Mobility: Rolling, Sidelying to Sit Rolling: Min guard Sidelying to sit: Mod assist       General bed mobility comments: educated on log roll technique    Transfers Overall transfer level: Needs assistance Equipment used: Rolling walker (2 wheels) Transfers: Sit to/from Stand, Bed to chair/wheelchair/BSC Sit to Stand: Mod assist, +2 safety/equipment   Step pivot transfers: Min assist, +2 safety/equipment       General transfer comment: pt wth some unsteadiness, decreased confidence    Ambulation/Gait               General Gait Details: unable due to pt fatigue at this time  Stairs            Wheelchair Mobility    Modified Rankin (Stroke Patients Only)       Balance Overall balance assessment: Needs assistance Sitting-balance support: Feet supported Sitting balance-Leahy Scale: Fair     Standing balance support: Reliant on  assistive device for balance Standing balance-Leahy Scale: Poor                                Pertinent Vitals/Pain Pain Assessment Pain Assessment: Faces Faces Pain Scale: Hurts a little bit Pain Location: buttocks Pain Descriptors / Indicators: Guarding, Aching, Grimacing Pain Intervention(s): Limited activity within patient's tolerance, Monitored during session, Repositioned    Home Living Family/patient expects to be discharged to:: Private residence Living Arrangements: Alone (spouse currently at [facility] for respite while patient in the hospital) Available Help at Discharge: Family;Available PRN/intermittently   Home Access: Ramped entrance         Home Equipment: Rolling Walker (2 wheels);Cane - single point      Prior Function Prior Level of Function : Independent/Modified Independent             Mobility Comments: using single point cane, and independent in ADLs/IADLs prior to recent hospitalizations       Hand Dominance   Dominant Hand: Right    Extremity/Trunk Assessment   Upper Extremity Assessment Upper Extremity Assessment: Generalized weakness    Lower Extremity Assessment Lower Extremity Assessment: Generalized weakness       Communication   Communication: No difficulties  Cognition Arousal/Alertness: Awake/alert Behavior During Therapy: WFL for tasks assessed/performed Overall Cognitive Status: No family/caregiver present to determine baseline cognitive functioning                                 General Comments: oriented to self, situation. followed all commands. a bit fearful of mobility        General Comments      Exercises     Assessment/Plan    PT Assessment Patient needs continued PT services  PT Problem List Decreased strength;Decreased activity tolerance;Decreased balance;Decreased mobility       PT Treatment Interventions DME instruction;Gait training;Functional mobility training;Therapeutic activities;Therapeutic exercise;Balance training;Neuromuscular re-education;Cognitive  remediation;Patient/family education    PT Goals (Current goals can be found in the Care Plan section)  Acute Rehab PT Goals Patient Stated Goal: to get back home PT Goal Formulation: With patient Time For Goal Achievement: 08/29/22 Potential to Achieve Goals: Fair    Frequency Min 2X/week     Co-evaluation               AM-PAC PT "6 Clicks" Mobility  Outcome Measure Help needed turning from your back to your side while in a flat bed without using bedrails?: A Lot Help needed moving from lying on your back to sitting on the side of a flat bed without using bedrails?: A Lot Help needed moving to and from a bed to a chair (including a wheelchair)?: A Little Help needed standing up from a chair using your arms (e.g., wheelchair or bedside chair)?: A Little Help needed to walk in hospital room?: A Lot Help needed climbing 3-5 steps with a railing? : Total 6 Click Score: 13    End of Session   Activity Tolerance: Patient tolerated treatment well;Patient limited by fatigue Patient left: in chair;with chair alarm set;with call bell/phone within reach Nurse Communication: Mobility status PT Visit Diagnosis: Unsteadiness on feet (R26.81);Muscle weakness (generalized) (M62.81);Difficulty in walking, not elsewhere classified (R26.2)    Time: 9417-4081 PT Time Calculation (min) (ACUTE ONLY): 25 min   Charges:   PT Evaluation $PT Eval Low Complexity: 1 Low PT Treatments $Therapeutic Activity:  8-22 mins        Lieutenant Diego PT, DPT 1:46 PM,08/15/22

## 2022-08-15 NOTE — Progress Notes (Signed)
Subjective:  CC: Sarah [name] is a 76 y.o. female  Hospital stay day 4,   rectal discharge  HPI: No acute changes overnight. No concerns  ROS:  General: Denies weight loss, weight gain, fatigue, fevers, chills, and night sweats. Eyes: Denies blurry vision, double vision, eye pain, itchy eyes, and tearing. Ears: Denies hearing loss, earache, and ringing in ears. Nose: Denies sinus pain, congestion, infections, runny nose, and nosebleeds. Mouth/throat: Denies hoarseness, sore throat, bleeding gums, and difficulty swallowing. Heart: Denies chest pain, palpitations, racing heart, irregular heartbeat, leg pain or swelling, and decreased activity tolerance. Respiratory: Denies breathing difficulty, shortness of breath, wheezing, cough, and sputum. GI: Denies change in appetite, heartburn, nausea, vomiting, constipation, diarrhea, and blood in stool. GU: Denies difficulty urinating, pain with urinating, urgency, frequency, blood in urine. Musculoskeletal: Denies joint stiffness, pain, swelling, muscle weakness. Skin: Denies rash, itching, mass, tumors, sores, and boils Neurologic: Denies headache, fainting, dizziness, seizures, numbness, and tingling. Psychiatric: Denies depression, anxiety, difficulty sleeping, and memory loss. Endocrine: Denies heat or cold intolerance, and increased thirst or urination. Blood/lymph: Denies easy bruising, and swollen glands   Objective:   Temp:  [97.4 F (36.3 C)-98.3 F (36.8 C)] 97.8 F (36.6 C) (09/18 0829) Pulse Rate:  [78-85] 85 (09/18 0829) Resp:  [16-20] 20 (09/18 0829) BP: (123-134)/(70-80) 134/80 (09/18 0829) SpO2:  [94 %-98 %] 94 % (09/18 0829) FiO2 (%):  [21 %] 21 % (09/17 2022)     Height: '5\' 3"'$  (160 cm) Weight: 55.7 kg BMI (Calculated): 21.76   Intake/Output this shift:   Intake/Output Summary (Last 24 hours) at 08/15/2022 0853 Last data filed at 08/15/2022 0600 Gross per 24 hour  Intake 310 ml  Output 500 ml  Net -190 ml     Constitutional :  alert, cooperative, and appears stated age  Respiratory:  clear to auscultation bilaterally  Cardiovascular:  regular rate and rhythm  Gastrointestinal: soft, non-tender; bowel sounds normal; no masses,  no organomegaly.   Skin: Cool and moist. Midline wound clean, minimal discahrge, necrotic umbilical tissue but no sign of infection.  Ostomy patent. IR drain with serosanguinous and feculant drainage in tubing  Psychiatric: Normal affect, non-agitated, not confused       LABS:     Latest Ref Rng & Units 08/15/2022    6:00 AM 08/13/2022    6:50 AM 08/12/2022    4:13 AM  CMP  Glucose 70 - 99 mg/dL 268  91  223   BUN 8 - 23 mg/dL 19  26  38   Creatinine 0.44 - 1.00 mg/dL 0.65  0.70  1.07   Sodium 135 - 145 mmol/L 133  133  132   Potassium 3.5 - 5.1 mmol/L 3.7  3.6  3.8   Chloride 98 - 111 mmol/L 97  95  93   CO2 22 - 32 mmol/L '29  30  28   '$ Calcium 8.9 - 10.3 mg/dL 7.5  7.8  8.4   Total Protein 6.5 - 8.1 g/dL   5.9   Total Bilirubin 0.3 - 1.2 mg/dL   0.7   Alkaline Phos 38 - 126 U/L   63   AST 15 - 41 U/L   21   ALT 0 - 44 U/L   23       Latest Ref Rng & Units 08/15/2022    6:00 AM 08/13/2022    6:50 AM 08/12/2022    4:13 AM  CBC  WBC 4.0 - 10.5 K/uL 4.0  4.8  8.8   Hemoglobin 12.0 - 15.0 g/dL 9.1  9.1  9.8   Hematocrit 36.0 - 46.0 % 29.2  29.0  30.6   Platelets 150 - 400 K/uL 163  144  182     RADS: N/a Assessment:   Lower GI bleed/discharge   S/p hartmans for perforated diverticulitis and COLON CA, now presenting with intra-abdominal abscess s/p IR guided drainage-  pending culture results.  Continue broad spectrum abx until results back.   New pseudomonas infection of midline open wound from recent surgery- Dakin's wet to dry.  Doing well. Continue for  5 day total course   DM, Sacral wound, afib, depression, recent NSTEMI, CHF, CAD, AKI, hypothyroidism, hyponatremia- Appreciate hospitalist recs.  Foley out, will need to arrange wound care if  patient is able to go home for sacral wound and midline wound.  Also need to make sure she is comfortable changing ostomy as well.  Review of previous noted also mentioned possible anticoagulation for NSTEMI, never started.  Will check with hospitalist/cards to confirm.    labs/images/medications/previous chart entries reviewed personally and relevant changes/updates noted above.

## 2022-08-15 NOTE — Care Management Important Message (Signed)
Important Message  Patient Details  Name: [name] MRN: [id number] Date of Birth: 1946-08-15   Medicare Important Message Given:  Yes     Dannette Barbara 08/15/2022, 11:56 AM

## 2022-08-15 NOTE — TOC Benefit Eligibility Note (Signed)
Patient Research scientist (life sciences) completed.     The patient is currently admitted and upon discharge could be taking Eliquis 2.'5mg'$ .   The current 30 day co-pay is, $47.   The patient is insured through [employer].

## 2022-08-15 NOTE — TOC Progression Note (Signed)
Transition of Care [facility]) - Progression Note    Patient Details  Name: Sarah [name] MRN: [id number] Date of Birth: [birth year]  Transition of Care [facility]) CM/SW Contact  Beverly Sessions, RN Phone Number: 08/15/2022, 3:33 PM  Clinical Narrative:      Met with patient at bedside.   She is agreeable to return to [facility] with TOC has started auth in [place] portal  MD updated [name] at [facility] notified  Expected Discharge Plan: [place] Barriers to Discharge: Continued Medical Work up  Expected Discharge Plan and Services Expected Discharge Plan: [place] Choice: [place] arrangements for the past 2 months: Single Family Home                                       Social Determinants of Health (SDOH) Interventions    Readmission Risk Interventions    08/14/2022    3:26 PM 07/28/2022    3:27 PM  Readmission Risk Prevention Plan  Transportation Screening Complete Complete  PCP or Specialist Appt within 3-5 Days  Complete  HRI or [place]  Complete  Social Work Consult for [place] Planning/Counseling  Complete  Palliative Care Screening  Complete  Medication Review Press photographer) Complete Complete  PCP or Specialist appointment within 3-5 days of discharge Complete   HRI or [place] Complete   SW Recovery Care/Counseling Consult Complete   [place] Not Applicable

## 2022-08-15 NOTE — Telephone Encounter (Signed)
Pharmacy Patient Advocate Encounter  Insurance verification completed.    The patient is insured through [employer]   The patient is currently admitted and ran test claims for the following: Eliquis 2.'5mg'$  BID.  Copays and coinsurance results were relayed to Inpatient clinical team.

## 2022-08-15 NOTE — Progress Notes (Addendum)
Progress Note    Sarah [name]  YQI:[id number] DOB: 11-19-1946  DOA: 08/11/2022 PCP: Rusty Aus, MD      Brief Narrative:    Medical records reviewed and are as summarized below:  Sarah [name] is a 76 y.o. female with past medical history significant for Graves' disease status post radiation therapy, diabetes mellitus on oral agents, hypertension, history of diverticulitis with abscess formation status post CT-guided drain placement, recent hospitalization and status post partial sigmoidectomy with Hartman's pouch for complicated diverticulitis, history of paroxysmal A-fib, postoperative non-ST elevation MI with left heart cath showing an LVEF of 25% with global hypokinesis and triple-vessel disease, chronic systolic CHF, protein calorie malnutrition who was recently discharged to skilled nursing facility for subacute rehab.  She was brought back to the hospital on 08/11/2022 for evaluation of bright red blood per rectum.  However, there was no blood in the stool from her colostomy bag.  She is on aspirin for CAD but she is not on long-term anticoagulation for atrial fibrillation.  She was found to have intra-abdominal abscess.     Assessment/Plan:   Principal Problem:   Rectal bleeding Active Problems:   Protein-calorie malnutrition, severe   Uncontrolled type 2 diabetes mellitus with hyperglycemia, without long-term current use of insulin (HCC)   Pressure injury of skin   Hyponatremia   Hypothyroidism   AKI (acute kidney injury) ([place])   CAD (coronary artery disease)   Chronic systolic CHF (congestive heart failure) (HCC)   Postoperative intra-abdominal abscess   Depression   Paroxysmal atrial fibrillation (HCC)   Hypoglycemia   Hypotension    Body mass index is 21.75 kg/m.   Midline abdominal Pseudomonas open wound infection from recent surgery, intra-abdominal abscess, s/p Hartman's procedure for perforated diverticulitis and colon cancer on 07/23/2022: S/p  placement of 10 French drain in pelvic abscess with return of purulent fluid on 08/13/2022.  She remains on IV cefepime and oral Flagyl.  Pelvic fluid culture still pending.  Follow-up with general surgeon.    Rectal bleeding/rectal discharge: No active bleeding  Type II DM with hyperglycemia, recent hypoglycemia: NovoLog as needed for hyperglycemia.  Hyponatremia: Improved  Thrombocytopenia, acute metabolic encephalopathy, AKI and hypotension: Resolved  Chronic systolic CHF, hypertension, CAD (recent NSTEMI with cardiac cath on 07/29/2022): Continue Lasix.  Restart lisinopril and Aldactone.  Cardiologist to decide whether she should start Plavix or continue with aspirin.  Paroxysmal atrial fibrillation: Reviewed cardiologist, Dr. Etta Quill note from 08/01/2022 and discharge summary from 08/01/2022.  Low-dose Eliquis was recommended for paroxysmal atrial fibrillation.  From Dr. Ines Bloomer standpoint, okay to start Eliquis.  Stage II sacral decubitus ulcer: Continue local wound care  Other comorbidities include hypothyroidism, depression     Diet Order             Diet 2 gram sodium Room service appropriate? Yes; Fluid consistency: Thin  Diet effective now                            Consultants: General surgeon  Procedures: S/p CT-guided placement of 10 French drain into pelvic abscess on 08/13/2022    Medications:    amiodarone  200 mg Oral Daily   apixaban  2.5 mg Oral BID   atorvastatin  40 mg Oral QHS   Chlorhexidine Gluconate Cloth  6 each Topical Daily   feeding supplement  237 mL Oral TID BM   furosemide  40 mg Oral Daily  insulin aspart  0-9 Units Subcutaneous TID WC   ipratropium  0.5 mg Nebulization BID   leptospermum manuka honey  1 Application Topical Daily   levothyroxine  125 mcg Oral QAC breakfast   lisinopril  2.5 mg Oral Daily   metoprolol tartrate  25 mg Oral TID   sertraline  100 mg Oral Daily   sodium chloride flush  5 mL Intracatheter Q8H    sodium hypochlorite   Irrigation Daily   spironolactone  12.5 mg Oral Daily   venlafaxine  37.5 mg Oral Daily   Continuous Infusions:  sodium chloride Stopped (08/15/22 1042)   ceFEPime (MAXIPIME) IV 2 g (08/15/22 1115)   metronidazole Stopped (08/15/22 1006)     Anti-infectives (From admission, onward)    Start     Dose/Rate Route Frequency Ordered Stop   08/14/22 1000  ceFEPIme (MAXIPIME) 2 g in sodium chloride 0.9 % 100 mL IVPB        2 g 200 mL/hr over 30 Minutes Intravenous Every 12 hours 08/14/22 0846     08/12/22 2000  metroNIDAZOLE (FLAGYL) IVPB 500 mg       See Hyperspace for full Linked Orders Report.   500 mg 100 mL/hr over 60 Minutes Intravenous Every 12 hours 08/12/22 0902 08/18/22 1959   08/12/22 1000  ciprofloxacin (CIPRO) IVPB 400 mg  Status:  Discontinued       See Hyperspace for full Linked Orders Report.   400 mg 200 mL/hr over 60 Minutes Intravenous Every 12 hours 08/12/22 0902 08/14/22 0846   08/11/22 2000  metroNIDAZOLE (FLAGYL) IVPB 500 mg  Status:  Discontinued       See Hyperspace for full Linked Orders Report.   500 mg 100 mL/hr over 60 Minutes Intravenous Every 12 hours 08/11/22 1712 08/12/22 0902   08/11/22 1900  ciprofloxacin (CIPRO) IVPB 400 mg  Status:  Discontinued       See Hyperspace for full Linked Orders Report.   400 mg 200 mL/hr over 60 Minutes Intravenous Every 24 hours 08/11/22 1712 08/12/22 0902              Family Communication/Anticipated D/C date and plan/Code Status   DVT prophylaxis: apixaban (ELIQUIS) tablet 2.5 mg Start: 08/15/22 1300 SCDs Start: 08/11/22 1713 apixaban (ELIQUIS) tablet 2.5 mg     Code Status: DNR  Family Communication: None Disposition Plan: To be determined by surgical surgeon         Subjective:   Interval events noted.  She feels better today.  No abdominal pain or vomiting.  Her pastor and his wife were at the bedside.  I politely asked them to excuse US  Objective:    Vitals:    08/14/22 1943 08/14/22 2022 08/15/22 0423 08/15/22 0829  BP: 127/73  123/76 134/80  Pulse: 78  78 85  Resp:   16 20  Temp: 98.3 F (36.8 C)  97.8 F (36.6 C) 97.8 F (36.6 C)  TempSrc: Oral  Oral Oral  SpO2: 98% 97% 96% 94%  Weight:      Height:       No data found.   Intake/Output Summary (Last 24 hours) at 08/15/2022 1203 Last data filed at 08/15/2022 1135 Gross per 24 hour  Intake 1120.24 ml  Output 1400 ml  Net -279.76 ml   Filed Weights   08/11/22 1502  Weight: 55.7 kg    Exam:  GEN: NAD SKIN: No rash EYES: EOMI ENT: MMM CV: RRR PULM: CTA B ABD: soft, ND,  NT, +BS, +colostomy bag, dressing on midline incisional wound is clean, dry and intact, + left lower quadrant abdominal drain in place CNS: AAO x 3, non focal EXT: No edema or tenderness      Data Reviewed:   I have personally reviewed following labs and imaging studies:  Labs: Labs show the following:   Basic Metabolic Panel: Recent Labs  Lab 08/11/22 1505 08/12/22 0413 08/13/22 0650 08/15/22 0600  NA 128* 132* 133* 133*  K 4.5 3.8 3.6 3.7  CL 89* 93* 95* 97*  CO2 '28 28 30 29  '$ GLUCOSE 398* 223* 91 268*  BUN 47* 38* 26* 19  CREATININE 1.54* 1.07* 0.70 0.65  CALCIUM 8.4* 8.4* 7.8* 7.5*   GFR Estimated Creatinine Clearance: 49.5 mL/min (by C-G formula based on SCr of 0.65 mg/dL). Liver Function Tests: Recent Labs  Lab 08/11/22 1505 08/12/22 0413  AST 30 21  ALT 26 23  ALKPHOS 67 63  BILITOT 0.8 0.7  PROT 5.9* 5.9*  ALBUMIN 2.2* 2.2*   No results for input(s): "LIPASE", "AMYLASE" in the last 168 hours. No results for input(s): "AMMONIA" in the last 168 hours. Coagulation profile Recent Labs  Lab 08/11/22 1511  INR 1.2    CBC: Recent Labs  Lab 08/11/22 1505 08/12/22 0001 08/12/22 0413 08/13/22 0650 08/15/22 0600  WBC 6.1  --  8.8 4.8 4.0  NEUTROABS 5.7  --   --  3.8 3.0  HGB 9.5* 10.2* 9.8* 9.1* 9.1*  HCT 29.5* 32.6* 30.6* 29.0* 29.2*  MCV 90.8  --  91.1 93.5 93.0   PLT 148*  --  182 144* 163   Cardiac Enzymes: No results for input(s): "CKTOTAL", "CKMB", "CKMBINDEX", "TROPONINI" in the last 168 hours. BNP (last 3 results) No results for input(s): "PROBNP" in the last 8760 hours. CBG: Recent Labs  Lab 08/14/22 0745 08/14/22 1137 08/14/22 1609 08/14/22 2131 08/15/22 0741  GLUCAP 249* 230* 205* 289* 234*   D-Dimer: No results for input(s): "DDIMER" in the last 72 hours. Hgb A1c: No results for input(s): "HGBA1C" in the last 72 hours. Lipid Profile: No results for input(s): "CHOL", "HDL", "LDLCALC", "TRIG", "CHOLHDL", "LDLDIRECT" in the last 72 hours. Thyroid function studies: No results for input(s): "TSH", "T4TOTAL", "T3FREE", "THYROIDAB" in the last 72 hours.  Invalid input(s): "FREET3" Anemia work up: No results for input(s): "VITAMINB12", "FOLATE", "FERRITIN", "TIBC", "IRON", "RETICCTPCT" in the last 72 hours. Sepsis Labs: Recent Labs  Lab 08/11/22 1505 08/12/22 0413 08/13/22 0650 08/15/22 0600  WBC 6.1 8.8 4.8 4.0    Microbiology Recent Results (from the past 240 hour(s))  Aerobic/Anaerobic Culture w Gram Stain (surgical/deep wound)     Status: None (Preliminary result)   Collection Time: 08/13/22 12:45 PM   Specimen: Abscess  Result Value Ref Range Status   Specimen Description   Final    ABSCESS Performed at [facility], [address], [place], [place] [address]    Special Requests   Final    Normal Performed at [facility], [address] [place], [place] [address]    Gram Stain   Final    MODERATE WBC PRESENT, PREDOMINANTLY MONONUCLEAR FEW GRAM POSITIVE COCCI IN PAIRS FEW GRAM POSITIVE RODS FEW GRAM NEGATIVE RODS    Culture   Final    CULTURE REINCUBATED FOR BETTER GROWTH Performed at [facility], [place] [address] [place], [place] [address]    Report Status PENDING  Incomplete  Aerobic/Anaerobic Culture w Gram Stain (surgical/deep wound)  Status: None (Preliminary result)    Collection Time: 08/13/22 12:46 PM   Specimen: Abscess  Result Value Ref Range Status   Specimen Description   Final    ABSCESS Performed at [facility], [address] [place], [place] [address]    Special Requests   Final    Normal Performed at [facility], [address] [place], [place] [address]    Gram Stain   Final    ABUNDANT WBC PRESENT,BOTH PMN AND MONONUCLEAR NO ORGANISMS SEEN    Culture   Final    FEW YEAST CULTURE REINCUBATED FOR BETTER GROWTH Performed at [facility], [place] [address], [place], [place] [address]    Report Status PENDING  Incomplete    Procedures and diagnostic studies:  CT GUIDED PERITONEAL/RETROPERITONEAL FLUID DRAIN BY PERC CATH  Result Date: 08/13/2022 CLINICAL DATA:  Status post Hartmann's procedure for diverticulitis of the sigmoid colon on 07/19/2022. Development postoperative abscesses in the left lateral abdomen adjacent to the descending colon and pelvis to the left of midline. EXAM: 1. CT GUIDED PERCUTANEOUS CATHETER DRAINAGE OF LEFT ABDOMINAL PERITONEAL ABSCESS 2. CT-GUIDED PERCUTANEOUS CATHETER DRAINAGE OF PELVIC PERITONEAL ABSCESS ANESTHESIA/SEDATION: Moderate (conscious) sedation was employed during this procedure. A total of Versed 1.0 mg and Fentanyl 50 mcg was administered intravenously by radiology nursing. Moderate Sedation Time: 45 minutes. The patient's level of consciousness and vital signs were monitored continuously by radiology nursing throughout the procedure under my direct supervision. PROCEDURE: The procedure, risks, benefits, and alternatives were explained to the patient. Questions regarding the procedure were encouraged and answered. The patient understands and consents to the procedure. A time out was performed prior to initiating the procedure. The operative field was prepped with chlorhexidine in a sterile fashion, and a sterile drape was applied covering the operative field. A sterile gown and  sterile gloves were used for the procedure. Local anesthesia was provided with 1% Lidocaine. CT was performed through the abdomen and pelvis in a supine position. Separate sites were marked along the left lateral abdominal wall and anterior lower left abdominal wall at the level of the pelvis. A 18 gauge trocar needle was advanced to the level of a left-sided peritoneal abscess located adjacent to the descending colon. After aspiration of fluid from the needle, a guidewire was advanced into the collection and the needle removed. The percutaneous tract was dilated over the wire and a 10 French percutaneous drain advanced over the wire. Catheter positioning was confirmed by CT. Additional fluid sample was aspirated from the drain and sent for culture analysis. The drainage catheter was flushed with sterile saline and connected to a suction bulb. A new 18 gauge trocar needle was then advanced from an anterior approach to the left of midline to the level of a left pelvic peritoneal abscess. After confirming needle tip position, a guidewire was advanced and the needle removed. The percutaneous tract was dilated over the wire and a 10 French percutaneous drainage catheter advanced. A fluid sample was withdrawn from the drain and sent for culture analysis. The drain was flushed with sterile saline and connected to a suction bulb. Both drainage catheters were secured at the skin with Prolene retention sutures and adhesive retention devices. Dressings were applied at the catheter exit sites. RADIATION DOSE REDUCTION: This exam was performed according to the departmental dose-optimization program which includes automated exposure control, adjustment of the mA and/or kV according to patient size and/or use of iterative reconstruction technique. COMPLICATIONS: None FINDINGS: Aspiration  at the level of the left lateral pericolic abscess yielded grossly purulent fluid. After placement of a drainage catheter within the collection,  there is good output of purulent fluid. After drain placement into the irregular cavity containing air and fluid in the left pelvis, there was return initially air followed by bloody fluid and debris. After placement of a drainage catheter within this collection there is output of bloody fluid. IMPRESSION: 1. CT-guided percutaneous catheter drainage of left lateral abdominal abscess yielding purulent fluid. A fluid sample was sent for culture analysis. A 10 French percutaneous drainage catheter was placed and attached to suction bulb drainage. 2. CT-guided percutaneous catheter drainage of pelvic abscess yielding bloody fluid and debris. A fluid sample was sent for culture analysis. A 10 French percutaneous drainage catheter was placed and attached to suction bulb drainage. Electronically Signed   By: Aletta Edouard M.D.   On: 08/13/2022 14:19               LOS: 4 days   Loletta Harper  [facility]   Pager on [email or web address]. If 7PM-7AM, please contact night-coverage at [email or web address]     08/15/2022, 12:03 PM

## 2022-08-15 NOTE — Progress Notes (Signed)
       CROSS COVER NOTE  NAME: Sarah [name] MRN: [id number] DOB : 1946-05-09    Date of Service   08/15/2022   HPI/Events of Note   Urge to void Bladder scan 534m  Interventions   Plan: In and Out Cath x1 X X      This document was prepared using Dragon voice recognition software and may include unintentional dictation errors.  KNeomia GlassDNP, MHA, FNP-BC Nurse Practitioner [facility] CE[facility] Health FosstonPager [phone]

## 2022-08-15 NOTE — Plan of Care (Signed)
  Problem: Activity: Goal: Ability to return to baseline activity level will improve Outcome: Progressing   Problem: Coping: Goal: Ability to adjust to condition or change in health will improve Outcome: Progressing   Problem: Activity: Goal: Risk for activity intolerance will decrease Outcome: Progressing   Problem: Coping: Goal: Level of anxiety will decrease Outcome: Progressing

## 2022-08-16 ENCOUNTER — Inpatient Hospital Stay: Payer: Medicare Other

## 2022-08-16 ENCOUNTER — Encounter: Payer: Self-pay | Admitting: Surgery

## 2022-08-16 DIAGNOSIS — K625 Hemorrhage of anus and rectum: Secondary | ICD-10-CM | POA: Diagnosis not present

## 2022-08-16 DIAGNOSIS — N179 Acute kidney failure, unspecified: Secondary | ICD-10-CM | POA: Diagnosis not present

## 2022-08-16 DIAGNOSIS — I5022 Chronic systolic (congestive) heart failure: Secondary | ICD-10-CM | POA: Diagnosis not present

## 2022-08-16 DIAGNOSIS — I480 Paroxysmal atrial fibrillation: Secondary | ICD-10-CM | POA: Diagnosis not present

## 2022-08-16 LAB — GLUCOSE, CAPILLARY
Glucose-Capillary: 150 mg/dL — ABNORMAL HIGH (ref 70–99)
Glucose-Capillary: 220 mg/dL — ABNORMAL HIGH (ref 70–99)
Glucose-Capillary: 135 mg/dL — ABNORMAL HIGH (ref 70–99)
Glucose-Capillary: 104 mg/dL — ABNORMAL HIGH (ref 70–99)

## 2022-08-16 MED ORDER — MEDIHONEY WOUND/BURN DRESSING EX PSTE: 1.0000 | PASTE | Freq: Every day | CUTANEOUS | 3 refills | Status: AC

## 2022-08-16 MED ORDER — METRONIDAZOLE 500 MG PO TABS: 500.0000 mg | ORAL_TABLET | Freq: Three times a day (TID) | ORAL | 0 refills | Status: AC

## 2022-08-16 MED ORDER — CIPROFLOXACIN HCL 500 MG PO TABS: 500.0000 mg | ORAL_TABLET | Freq: Two times a day (BID) | ORAL | 0 refills | Status: AC

## 2022-08-16 MED ORDER — APIXABAN 2.5 MG PO TABS: 2.5000 mg | ORAL_TABLET | Freq: Two times a day (BID) | ORAL | 2 refills | Status: AC

## 2022-08-16 MED ORDER — IOHEXOL 9 MG/ML PO SOLN
500.0000 mL | ORAL | Status: AC
  Administered 2022-08-16 (×2): 500 mL via ORAL

## 2022-08-16 MED ORDER — FLUCONAZOLE 100 MG PO TABS: 100.0000 mg | ORAL_TABLET | Freq: Every day | ORAL | 0 refills | Status: AC

## 2022-08-16 MED ORDER — IOHEXOL 300 MG/ML  SOLN
100.0000 mL | Freq: Once | INTRAMUSCULAR | Status: AC | PRN
  Administered 2022-08-16: 100 mL via INTRAVENOUS

## 2022-08-16 MED ORDER — TRAMADOL HCL 50 MG PO TABS: 50.0000 mg | ORAL_TABLET | Freq: Four times a day (QID) | ORAL | 0 refills | Status: DC | PRN

## 2022-08-16 NOTE — Discharge Instructions (Addendum)
-  Wet to dry dressing changes to midline wound daily - flush each IR drain with 34m of saline flush three times a day, record output - keep foley until followup with urology - sacral wound care as noted on medihoney prescription -routine ostomy care as needed.

## 2022-08-16 NOTE — Progress Notes (Addendum)
Mobility Specialist - Progress Note   08/16/22 1400  Mobility  Activity Dangled on edge of bed  Level of Assistance Minimal assist, patient does 75% or more  Distance Ambulated (ft) 0 ft  $Mobility charge 1 Mobility     Pt sleeping on arrival, awakened by voice and agreeable to activity with little encouragement. Pt sat EOB with minA before requesting to defer further activity d/t not feeling well. Pt states "today's just not a good day" but denies nausea, SOB, and only voices mild pain. Requests to return later today for mobility. Pt returned to bed with alarm set, needs in reach.    Kathee Delton Mobility Specialist 08/16/22, 2:37 PM

## 2022-08-16 NOTE — Discharge Summary (Incomplete)
Physician Discharge Summary  Patient ID: Sarah [name] MRN: [id number] DOB/AGE: 1946/08/10 76 y.o.  Admit date: 08/11/2022 Discharge date: 08/17/22  Admission Diagnoses: rectal bleeding/discharge  Discharge Diagnoses:  Same as above plus intra-abdominal abscess Type 2 diabetes Hyponatremia Thrombocytopenia Acute kidney injury Chronic systolic CHF Hypertension Recent NSTEMI with cardiac cath on July 29, 2022 Paroxysmal atrial fibrillation Stage II sacral decubitus ulcer Hypothyroidism Depression Colon cancer Urinary retention  Discharged Condition: fair  Hospital Course: admitted for above for the rectal discharge work-up.  CT noted intra-abdominal abscesses amenable to IR guided drainage.  IR placed these drains.  IV antibiotics continued until drains were in place and metabolic encephalopathy likely from sepsis resolved.  Home medications were continued for her comorbidities and after review of previous admission notes, Eliquis was added for her recent NSTEMI.  She was also noted to have Pseudomonas infection in her open midline wound, treated with Dakin's for several days prior to discharge.  Switched to daily wet to dry dressing changes and this will be continued until wound is healed   She was also seen by oncology for her recent diagnosis Colon cancer.  No additional treatment recommendations.  We will obtain staging CT scan.  Urology notified for persistent urinary retention despite couple in and out and foley placement.  They will see as outpt and try another foley removal at that time      Consults: hematology/oncology and IR, and hospitalist  Discharge Exam: Blood pressure 130/74, pulse 77, temperature 98.2 F (36.8 C), temperature source Oral, resp. rate 14, height '5\' 3"'$  (1.6 m), weight 55.7 kg, SpO2 97 %. General appearance: alert, cooperative, and no distress Incision/Wound: clean base, no discharge.   GI: abdomen soft, no TTP, IR drain with purulent drainage  today.  Ostomy intact, patent  Disposition:  Discharge disposition: 03-Skilled Nursing Facility        Allergies as of 08/17/2022       Reactions   Penicillins Anaphylaxis   TOLERATED CEFOTETAN 06/2022   Sulfa Antibiotics Rash        Medication List     STOP taking these medications    gabapentin 100 MG capsule Commonly known as: NEURONTIN   oxyCODONE-acetaminophen 5-325 MG tablet Commonly known as: PERCOCET/ROXICET   predniSONE 10 MG tablet Commonly known as: DELTASONE   simvastatin 20 MG tablet Commonly known as: ZOCOR       TAKE these medications    acetaminophen 325 MG tablet Commonly known as: TYLENOL Take 2 tablets (650 mg total) by mouth every 4 (four) hours as needed for headache or mild pain.   amiodarone 200 MG tablet Commonly known as: PACERONE Take 1 tablet (200 mg total) by mouth daily.   apixaban 2.5 MG Tabs tablet Commonly known as: ELIQUIS Take 1 tablet (2.5 mg total) by mouth 2 (two) times daily.   ascorbic acid 500 MG tablet Commonly known as: VITAMIN C Take 1 tablet (500 mg total) by mouth 2 (two) times daily.   aspirin 81 MG chewable tablet Chew 1 tablet (81 mg total) by mouth daily.   atorvastatin 40 MG tablet Commonly known as: LIPITOR Take 1 tablet (40 mg total) by mouth at bedtime.   ciprofloxacin 500 MG tablet Commonly known as: CIPRO Take 1 tablet (500 mg total) by mouth 2 (two) times daily for 7 days.   cyanocobalamin 1000 MCG tablet Take 1,000 mcg by mouth daily.   feeding supplement Liqd Take 237 mLs by mouth 3 (three) times daily between meals.  fluconazole 100 MG tablet Commonly known as: Diflucan Take 1 tablet (100 mg total) by mouth daily for 7 days.   furosemide 40 MG tablet Commonly known as: LASIX Take 40 mg by mouth daily.   glimepiride 4 MG tablet Commonly known as: AMARYL Take 4 mg by mouth daily with breakfast.   ipratropium 0.02 % nebulizer solution Commonly known as: ATROVENT Take 2.5  mLs (0.5 mg total) by nebulization 2 (two) times daily.   ipratropium-albuterol 0.5-2.5 (3) MG/3ML Soln Commonly known as: DUONEB Take 3 mLs by nebulization every 6 (six) hours as needed.   leptospermum manuka honey Pste paste Apply 1 Application topically daily. Apply to sacral wound, top with saline moistened gauze dressing, dry dressing and secure with silicone bordered foam. Apply thin layer (3 mm) to wound.   levalbuterol 0.63 MG/3ML nebulizer solution Commonly known as: XOPENEX Take 3 mLs (0.63 mg total) by nebulization 2 (two) times daily.   levothyroxine 125 MCG tablet Commonly known as: SYNTHROID Take 1 tablet (125 mcg total) by mouth daily before breakfast.   lisinopril 2.5 MG tablet Commonly known as: ZESTRIL Take 1 tablet (2.5 mg total) by mouth daily.   magnesium oxide 400 (240 Mg) MG tablet Commonly known as: MAG-OX Take 400 mg by mouth daily.   metoprolol tartrate 25 MG tablet Commonly known as: LOPRESSOR Take 1 tablet (25 mg total) by mouth 3 (three) times daily.   metroNIDAZOLE 500 MG tablet Commonly known as: Flagyl Take 1 tablet (500 mg total) by mouth 3 (three) times daily for 7 days.   multivitamin with minerals Tabs tablet Take 1 tablet by mouth daily.   ondansetron 4 MG disintegrating tablet Commonly known as: ZOFRAN-ODT Take 1 tablet (4 mg total) by mouth every 8 (eight) hours as needed for nausea or vomiting.   potassium chloride SA 20 MEQ tablet Commonly known as: KLOR-CON M Take 20 mEq by mouth 2 (two) times daily.   sertraline 100 MG tablet Commonly known as: ZOLOFT Take 100 mg by mouth daily.   sodium chloride flush 0.9 % Soln Commonly known as: NS 5 mLs by Intracatheter route every 8 (eight) hours.   spironolactone 25 MG tablet Commonly known as: ALDACTONE Take 0.5 tablets (12.5 mg total) by mouth daily.   venlafaxine 37.5 MG tablet Commonly known as: EFFEXOR Take 37.5 mg by mouth daily.   zinc sulfate 220 (50 Zn) MG  capsule Take 1 capsule (220 mg total) by mouth daily.        Follow-up Information     Lysle Pearl, Charmion Hapke, DO Follow up in 2 week(s).   Specialty: Surgery Why: midline wound check Contact information: [place] [address] [phone]         [facility] Follow up in 2 week(s).   Why: IR drain check for intra-abdominal abscess Contact information: [place] [address] [phone]         Lujean Amel D, MD Follow up in 1 week(s).   Specialties: Cardiology, Internal Medicine Why: NSTEMI F/U hospitalization Contact information: [place] [address] [phone]         Abbie Sons, MD Follow up in 1 week(s).   Specialty: Urology Why: urinary retention Contact information: [place] [address] [phone]                  Total time spent arranging discharge was >25mn. Signed: IBenjamine Sprague9/20/2023, 8:03 AM

## 2022-08-16 NOTE — TOC Progression Note (Signed)
Transition of Care [facility]) - Progression Note    Patient Details  Name: Sarah [name] MRN: [id number] Date of Birth: 1946/02/03  Transition of Care [facility]) CM/SW Contact  Beverly Sessions, RN Phone Number: 08/16/2022, 10:12 AM  Clinical Narrative:    Insurance auth obtained.  Approved 9/19-9/21.  Reference ID: [id number].  Plan Auth: [id number]. MD notified    Expected Discharge Plan: [place] Barriers to Discharge: Continued Medical Work up  Expected Discharge Plan and Services Expected Discharge Plan: [place] Choice: [place] arrangements for the past 2 months: Single Family Home                                       Social Determinants of Health (SDOH) Interventions    Readmission Risk Interventions    08/14/2022    3:26 PM 07/28/2022    3:27 PM  Readmission Risk Prevention Plan  Transportation Screening Complete Complete  PCP or Specialist Appt within 3-5 Days  Complete  HRI or [place]  Complete  Social Work Consult for [place] Planning/Counseling  Complete  Palliative Care Screening  Complete  Medication Review Press photographer) Complete Complete  PCP or Specialist appointment within 3-5 days of discharge Complete   HRI or [place] Complete   SW Recovery Care/Counseling Consult Complete   [place] Not Applicable

## 2022-08-16 NOTE — TOC Progression Note (Signed)
Transition of Care [facility]) - Progression Note    Patient Details  Name: Sarah [name] MRN: [id number] Date of Birth: 02-09-1946  Transition of Care [facility]) CM/SW Contact  Beverly Sessions, RN Phone Number: 08/16/2022, 2:01 PM  Clinical Narrative:     Per MD not medically ready for discharge [name] at [facility] notified DNR placed on chart for MD to sign.  MD notified   Expected Discharge Plan: [place] Barriers to Discharge: Continued Medical Work up  Expected Discharge Plan and Services Expected Discharge Plan: [place] Choice: [place] arrangements for the past 2 months: Single Family Home                                       Social Determinants of Health (SDOH) Interventions    Readmission Risk Interventions    08/14/2022    3:26 PM 07/28/2022    3:27 PM  Readmission Risk Prevention Plan  Transportation Screening Complete Complete  PCP or Specialist Appt within 3-5 Days  Complete  HRI or [place]  Complete  Social Work Consult for [place] Planning/Counseling  Complete  Palliative Care Screening  Complete  Medication Review Press photographer) Complete Complete  PCP or Specialist appointment within 3-5 days of discharge Complete   HRI or [place] Complete   SW Recovery Care/Counseling Consult Complete   [place] Not Applicable

## 2022-08-16 NOTE — Progress Notes (Signed)
Progress Note    Sarah [name]  QTM:[id number] DOB: 06-26-1946  DOA: 08/11/2022 PCP: Rusty Aus, MD      Brief Narrative:    Medical records reviewed and are as summarized below:  Sarah [name] is a 76 y.o. female with past medical history significant for Graves' disease status post radiation therapy, diabetes mellitus on oral agents, hypertension, history of diverticulitis with abscess formation status post CT-guided drain placement, recent hospitalization and status post partial sigmoidectomy with Hartman's pouch for complicated diverticulitis, history of paroxysmal A-fib, postoperative non-ST elevation MI with left heart cath showing an LVEF of 25% with global hypokinesis and triple-vessel disease, chronic systolic CHF, protein calorie malnutrition who was recently discharged to skilled nursing facility for subacute rehab.  She was brought back to the hospital on 08/11/2022 for evaluation of bright red blood per rectum.  However, there was no blood in the stool from her colostomy bag.  She is on aspirin for CAD but she is not on long-term anticoagulation for atrial fibrillation.  She was found to have intra-abdominal abscess.     Assessment/Plan:   Principal Problem:   Rectal bleeding Active Problems:   Protein-calorie malnutrition, severe   Uncontrolled type 2 diabetes mellitus with hyperglycemia, without long-term current use of insulin (HCC)   Pressure injury of skin   Hyponatremia   Hypothyroidism   AKI (acute kidney injury) ([place])   CAD (coronary artery disease)   Chronic systolic CHF (congestive heart failure) (HCC)   Postoperative intra-abdominal abscess   Depression   Paroxysmal atrial fibrillation (HCC)   Hypoglycemia   Hypotension    Body mass index is 21.75 kg/m.   Midline abdominal Pseudomonas open wound infection from recent surgery, intra-abdominal abscess, s/p Hartman's procedure for perforated diverticulitis and colon cancer on 07/23/2022: S/p  placement of 10 French drain in pelvic abscess with return of purulent fluid on 08/13/2022.  Wound culture is growing abundant Enterococcus faecalis, abundant Streptococcus anginosis, few Candida tropicalis.  Susceptibility report is pending.  She is still on IV cefepime and IV Flagyl.  Rectal bleeding/rectal discharge: No active bleeding  Type II DM with hyperglycemia, recent hypoglycemia: NovoLog as needed for hyperglycemia.  Hyponatremia: Improved  Thrombocytopenia, acute metabolic encephalopathy, AKI and hypotension: Resolved  Chronic systolic CHF, hypertension, CAD (recent NSTEMI with cardiac cath on 07/29/2022): Continue Lasix, lisinopril and Aldactone. Her cardiologist should decide whether she should start Plavix or continue with aspirin.  Paroxysmal atrial fibrillation: Continue Eliquis  Stage II sacral decubitus ulcer: Continue local wound care  Other comorbidities include hypothyroidism, depression     Diet Order             Diet 2 gram sodium Room service appropriate? Yes; Fluid consistency: Thin  Diet effective now                            Consultants: General surgeon  Procedures: S/p CT-guided placement of 10 French drain into pelvic abscess on 08/13/2022    Medications:    amiodarone  200 mg Oral Daily   apixaban  2.5 mg Oral BID   atorvastatin  40 mg Oral QHS   Chlorhexidine Gluconate Cloth  6 each Topical Daily   feeding supplement  237 mL Oral TID BM   furosemide  40 mg Oral Daily   insulin aspart  0-9 Units Subcutaneous TID WC   iohexol  500 mL Oral Q1H   ipratropium  0.5 mg  Nebulization BID   leptospermum manuka honey  1 Application Topical Daily   levothyroxine  125 mcg Oral QAC breakfast   lisinopril  2.5 mg Oral Daily   metoprolol tartrate  25 mg Oral TID   sertraline  100 mg Oral Daily   sodium chloride flush  5 mL Intracatheter Q8H   spironolactone  12.5 mg Oral Daily   venlafaxine  37.5 mg Oral Daily   Continuous  Infusions:  sodium chloride Stopped (08/15/22 1301)   ceFEPime (MAXIPIME) IV 2 g (08/16/22 0935)   metronidazole 500 mg (08/16/22 0941)     Anti-infectives (From admission, onward)    Start     Dose/Rate Route Frequency Ordered Stop   08/16/22 0000  ciprofloxacin (CIPRO) 500 MG tablet        500 mg Oral 2 times daily 08/16/22 1339 08/23/22 2359   08/16/22 0000  metroNIDAZOLE (FLAGYL) 500 MG tablet        500 mg Oral 3 times daily 08/16/22 1339 08/23/22 2359   08/16/22 0000  fluconazole (DIFLUCAN) 100 MG tablet        100 mg Oral Daily 08/16/22 1339 08/23/22 2359   08/14/22 1000  ceFEPIme (MAXIPIME) 2 g in sodium chloride 0.9 % 100 mL IVPB        2 g 200 mL/hr over 30 Minutes Intravenous Every 12 hours 08/14/22 0846     08/12/22 2000  metroNIDAZOLE (FLAGYL) IVPB 500 mg       See Hyperspace for full Linked Orders Report.   500 mg 100 mL/hr over 60 Minutes Intravenous Every 12 hours 08/12/22 0902 08/18/22 1959   08/12/22 1000  ciprofloxacin (CIPRO) IVPB 400 mg  Status:  Discontinued       See Hyperspace for full Linked Orders Report.   400 mg 200 mL/hr over 60 Minutes Intravenous Every 12 hours 08/12/22 0902 08/14/22 0846   08/11/22 2000  metroNIDAZOLE (FLAGYL) IVPB 500 mg  Status:  Discontinued       See Hyperspace for full Linked Orders Report.   500 mg 100 mL/hr over 60 Minutes Intravenous Every 12 hours 08/11/22 1712 08/12/22 0902   08/11/22 1900  ciprofloxacin (CIPRO) IVPB 400 mg  Status:  Discontinued       See Hyperspace for full Linked Orders Report.   400 mg 200 mL/hr over 60 Minutes Intravenous Every 24 hours 08/11/22 1712 08/12/22 0902              Family Communication/Anticipated D/C date and plan/Code Status   DVT prophylaxis: apixaban (ELIQUIS) tablet 2.5 mg Start: 08/15/22 2200 SCDs Start: 08/11/22 1713 apixaban (ELIQUIS) tablet 2.5 mg     Code Status: DNR  Family Communication: None Disposition Plan: To be determined by surgical  surgeon         Subjective:   Interval events noted.  She has no complaints.  No abdominal pain or vomiting.  She feels comfortable.  Minette Brine, RN, was at the bedside  Objective:    Vitals:   08/15/22 2013 08/16/22 0412 08/16/22 0806 08/16/22 0920  BP:  122/64  126/65  Pulse:  72 73 80  Resp:  '16 16 18  '$ Temp:  97.8 F (36.6 C)  97.7 F (36.5 C)  TempSrc:  Oral    SpO2: 93% 98% 96% 97%  Weight:      Height:       No data found.   Intake/Output Summary (Last 24 hours) at 08/16/2022 1545 Last data filed at 08/16/2022 1223 Gross per  24 hour  Intake 417.8 ml  Output 1470 ml  Net -1052.2 ml   Filed Weights   08/11/22 1502  Weight: 55.7 kg    Exam:  GEN: NAD SKIN: Warm and dry EYES: EOMI ENT: MMM CV: RRR PULM: CTA B ABD: soft, ND, NT, +BS, + colostomy bag, dressing midline incisional wound is clean, dry and intact,, + left lower quadrant abdominal drain in place CNS: AAO x 3, non focal EXT: No edema or tenderness      Data Reviewed:   I have personally reviewed following labs and imaging studies:  Labs: Labs show the following:   Basic Metabolic Panel: Recent Labs  Lab 08/11/22 1505 08/12/22 0413 08/13/22 0650 08/15/22 0600  NA 128* 132* 133* 133*  K 4.5 3.8 3.6 3.7  CL 89* 93* 95* 97*  CO2 '28 28 30 29  '$ GLUCOSE 398* 223* 91 268*  BUN 47* 38* 26* 19  CREATININE 1.54* 1.07* 0.70 0.65  CALCIUM 8.4* 8.4* 7.8* 7.5*   GFR Estimated Creatinine Clearance: 49.5 mL/min (by C-G formula based on SCr of 0.65 mg/dL). Liver Function Tests: Recent Labs  Lab 08/11/22 1505 08/12/22 0413  AST 30 21  ALT 26 23  ALKPHOS 67 63  BILITOT 0.8 0.7  PROT 5.9* 5.9*  ALBUMIN 2.2* 2.2*   No results for input(s): "LIPASE", "AMYLASE" in the last 168 hours. No results for input(s): "AMMONIA" in the last 168 hours. Coagulation profile Recent Labs  Lab 08/11/22 1511  INR 1.2    CBC: Recent Labs  Lab 08/11/22 1505 08/12/22 0001 08/12/22 0413  08/13/22 0650 08/15/22 0600  WBC 6.1  --  8.8 4.8 4.0  NEUTROABS 5.7  --   --  3.8 3.0  HGB 9.5* 10.2* 9.8* 9.1* 9.1*  HCT 29.5* 32.6* 30.6* 29.0* 29.2*  MCV 90.8  --  91.1 93.5 93.0  PLT 148*  --  182 144* 163   Cardiac Enzymes: No results for input(s): "CKTOTAL", "CKMB", "CKMBINDEX", "TROPONINI" in the last 168 hours. BNP (last 3 results) No results for input(s): "PROBNP" in the last 8760 hours. CBG: Recent Labs  Lab 08/15/22 1233 08/15/22 1622 08/15/22 2216 08/16/22 0747 08/16/22 1204  GLUCAP 302* 213* 127* 150* 220*   D-Dimer: No results for input(s): "DDIMER" in the last 72 hours. Hgb A1c: No results for input(s): "HGBA1C" in the last 72 hours. Lipid Profile: No results for input(s): "CHOL", "HDL", "LDLCALC", "TRIG", "CHOLHDL", "LDLDIRECT" in the last 72 hours. Thyroid function studies: No results for input(s): "TSH", "T4TOTAL", "T3FREE", "THYROIDAB" in the last 72 hours.  Invalid input(s): "FREET3" Anemia work up: No results for input(s): "VITAMINB12", "FOLATE", "FERRITIN", "TIBC", "IRON", "RETICCTPCT" in the last 72 hours. Sepsis Labs: Recent Labs  Lab 08/11/22 1505 08/12/22 0413 08/13/22 0650 08/15/22 0600  WBC 6.1 8.8 4.8 4.0    Microbiology Recent Results (from the past 240 hour(s))  Aerobic/Anaerobic Culture w Gram Stain (surgical/deep wound)     Status: None (Preliminary result)   Collection Time: 08/13/22 12:45 PM   Specimen: Abscess  Result Value Ref Range Status   Specimen Description   Final    ABSCESS Performed at [facility], [address] [place], [place] [address]    Special Requests   Final    Normal Performed at [facility], [address] [place], [place] [address]    Gram Stain   Final    MODERATE WBC PRESENT, PREDOMINANTLY MONONUCLEAR FEW GRAM POSITIVE COCCI IN PAIRS FEW GRAM POSITIVE RODS FEW Lonell Grandchild  NEGATIVE RODS Performed at [facility], [place] [address], [place], [place] [address]    Culture    Final    ABUNDANT ENTEROCOCCUS FAECALIS ABUNDANT STREPTOCOCCUS ANGINOSIS SUSCEPTIBILITIES TO FOLLOW FEW CANDIDA TROPICALIS NO ANAEROBES ISOLATED; CULTURE IN PROGRESS FOR 5 DAYS    Report Status PENDING  Incomplete  Aerobic/Anaerobic Culture w Gram Stain (surgical/deep wound)     Status: None (Preliminary result)   Collection Time: 08/13/22 12:46 PM   Specimen: Abscess  Result Value Ref Range Status   Specimen Description   Final    ABSCESS Performed at [facility], [address], [place], [place] [address]    Special Requests   Final    Normal Performed at [facility], [address] [place], [place] [address]    Gram Stain   Final    ABUNDANT WBC PRESENT,BOTH PMN AND MONONUCLEAR NO ORGANISMS SEEN Performed at [facility], [place] [address] [place], [place] [address]    Culture   Final    FEW CANDIDA ALBICANS NO ANAEROBES ISOLATED; CULTURE IN PROGRESS FOR 5 DAYS    Report Status PENDING  Incomplete    Procedures and diagnostic studies:  No results found.             LOS: 5 days   Ottavio Norem  [facility]   Pager on [email or web address]. If 7PM-7AM, please contact night-coverage at [email or web address]     08/16/2022, 3:45 PM

## 2022-08-16 NOTE — Progress Notes (Signed)
PT Cancellation Note  Patient Details Name: Sarah [name] MRN: [id number] DOB: 12-20-1945   Cancelled Treatment:   Pt declined all mobility/activity today, despite max education and encouragement. Reason Eval/Treat Not Completed: Other (comment)  Glenice Laine MPH, SPT 08/16/22, 1:52 PM

## 2022-08-16 NOTE — Consult Note (Signed)
Hematology/Oncology Consult note [facility] [phone] [phone]  Patient Care Team: Rusty Aus, MD as PCP - General (Internal Medicine)   Name of the patient: Sarah [name]  [id number]  11-Feb-1946    Reason for consult: New diagnosis of colon cancer   Requesting physician: Dr. Lysle Pearl  Date of visit:08/15/22   History of presenting illness-patient is [name] who was diagnosed with diverticulitis back in July 2023 which was tried to be managed medically.  She had IR guided drain placement as well as IV antibiotics in July her symptoms did not get better patient was ultimately taken for surgery for sigmoid resection on 07/19/2022.  Pathology showed invasive colorectal adenocarcinoma which was found in the area of perforated diverticulitis with pericolonic abscess.  This was 7 cm extending into the muscularis propria.  Tumor perforation not identified.  Lymphovascular invasion and perineural invasion not identified.  Margins negative.  9 lymph nodes negative for malignancy.  At least a PT3N0.  There was evidence of adenocarcinoma focally present in the pericolonic abscess and T4 was not excluded.  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  She has a history of paroxysmal A-fib, non-ST segment elevation MI with an LVEF of 25% and triple-vessel disease, protein calorie malnutrition.  She was readmitted from subacute rehab after she was found to have bright red blood per rectum.  She was found to have intra-abdominal abscesses.  This is secondary to Pseudomonas wound infection from recent surgery.  She is currently on IV cefepime and Flagyl.  ECOG PS- 3  Pain scale- 3   Review of systems- Review of Systems  Constitutional:  Positive for malaise/fatigue.    Allergies  Allergen Reactions   Penicillins Anaphylaxis    TOLERATED CEFOTETAN 06/2022   Sulfa Antibiotics Rash    Patient Active Problem List    Diagnosis Date Noted   Hypoglycemia 08/12/2022   Hypotension 08/12/2022   Rectal bleeding 08/11/2022   AKI (acute kidney injury) ([place]) 08/11/2022   CAD (coronary artery disease) 94/76/5465   Chronic systolic CHF (congestive heart failure) ([place]) 08/11/2022   Postoperative intra-abdominal abscess 08/11/2022   Depression 08/11/2022   Paroxysmal atrial fibrillation ([place]) 08/11/2022   Hypothyroidism    Protein-calorie malnutrition, severe 07/23/2022   Pressure injury of skin 07/23/2022   NSTEMI (non-ST elevated myocardial infarction) ([place]) 03/54/6568   Acute systolic CHF (congestive heart failure) ([place]) 07/23/2022   Atrial fibrillation with RVR ([place]) 07/23/2022   Electrolyte abnormality including hyponatremia, hypokalemia, hypophosphatemia and hypomagnesemia 07/23/2022   Hyponatremia 07/23/2022   Uncontrolled type 2 diabetes mellitus with hyperglycemia, without long-term current use of insulin ([place]) 07/23/2022   Hypokalemia 07/23/2022   Hypomagnesemia 07/23/2022   Diverticulitis 07/19/2022   Diverticulitis large intestine 05/28/2022     Past Medical History:  Diagnosis Date   Arthritis    hands   Cancer ([place])    thyroid- radiation   Diabetes mellitus without complication ([place])    Hyperlipidemia    Hypertension    Hypothyroidism    IBS (irritable bowel syndrome)      Past Surgical History:  Procedure Laterality Date   ABDOMINAL HYSTERECTOMY     CATARACT EXTRACTION W/PHACO Left 12/21/2016   Procedure: CATARACT EXTRACTION PHACO AND INTRAOCULAR LENS PLACEMENT ([place])  left eye;  Surgeon: Leandrew Koyanagi, MD;  Location: [facility];  Service: Ophthalmology;  Laterality: Left;  Diabetic - oral meds   CATARACT EXTRACTION W/PHACO Right 01/25/2017   Procedure: CATARACT EXTRACTION  PHACO AND INTRAOCULAR LENS PLACEMENT ([facility])  right diabetic;  Surgeon: Leandrew Koyanagi, MD;  Location: [place];  Service: Ophthalmology;  Laterality: Right;  diabetic - oral meds    CHOLECYSTECTOMY     COLONOSCOPY WITH PROPOFOL N/A 10/26/2016   Procedure: COLONOSCOPY WITH PROPOFOL;  Surgeon: Manya Silvas, MD;  Location: [facility] ENDOSCOPY;  Service: Endoscopy;  Laterality: N/A;   IR RADIOLOGIST EVAL & MGMT  06/14/2022   KNEE SURGERY Right    LEFT HEART CATH AND CORONARY ANGIOGRAPHY N/A 07/29/2022   Procedure: LEFT HEART CATH AND CORONARY ANGIOGRAPHY;  Surgeon: Yolonda Kida, MD;  Location: [place] CV LAB;  Service: Cardiovascular;  Laterality: N/A;    Social History   Socioeconomic History   Marital status: Not on file    Spouse name: Not on file   Number of children: Not on file   Years of education: Not on file   Highest education level: Not on file  Occupational History   Not on file  Tobacco Use   Smoking status: Never   Smokeless tobacco: Never   Tobacco comments:    social as teenager  Vaping Use   Vaping Use: Never used  Substance and Sexual Activity   Alcohol use: No   Drug use: No   Sexual activity: Not on file  Other Topics Concern   Not on file  Social History Narrative   Not on file   Social Determinants of Health   Financial Resource Strain: Not on file  Food Insecurity: No Food Insecurity (08/16/2022)   Hunger Vital Sign    Worried About Running Out of Food in the Last Year: Never true    Ran Out of Food in the Last Year: Never true  Transportation Needs: No Transportation Needs (08/16/2022)   PRAPARE - Hydrologist (Medical): No    Lack of Transportation (Non-Medical): No  Physical Activity: Not on file  Stress: Not on file  Social Connections: Not on file  Intimate Partner Violence: Not At Risk (08/16/2022)   Humiliation, Afraid, Rape, and Kick questionnaire    Fear of Current or Ex-Partner: No    Emotionally Abused: No    Physically Abused: No    Sexually Abused: No     Family History  Problem Relation Age of Onset   Breast cancer Neg Hx      Current Facility-Administered  Medications:    0.9 %  sodium chloride infusion, , Intravenous, PRN, Sakai, Isami, DO, Stopped at 08/15/22 1301   acetaminophen (TYLENOL) tablet 650 mg, 650 mg, Oral, Q4H PRN, Jennye Boroughs, MD   albuterol (PROVENTIL) (2.5 MG/3ML) 0.083% nebulizer solution 2.5 mg, 2.5 mg, Nebulization, Q6H PRN, Jennye Boroughs, MD   amiodarone (PACERONE) tablet 200 mg, 200 mg, Oral, Daily, Jennye Boroughs, MD, 200 mg at 08/16/22 7858   apixaban (ELIQUIS) tablet 2.5 mg, 2.5 mg, Oral, BID, Jennye Boroughs, MD, 2.5 mg at 08/16/22 0924   atorvastatin (LIPITOR) tablet 40 mg, 40 mg, Oral, QHS, Jennye Boroughs, MD, 40 mg at 08/15/22 2201   ceFEPIme (MAXIPIME) 2 g in sodium chloride 0.9 % 100 mL IVPB, 2 g, Intravenous, Q12H, Jennye Boroughs, MD, Stopped at 08/16/22 0940   Chlorhexidine Gluconate Cloth 2 % PADS 6 each, 6 each, Topical, Daily, Sakai, Isami, DO, 6 each at 08/16/22 1645   feeding supplement (ENSURE ENLIVE / ENSURE PLUS) liquid 237 mL, 237 mL, Oral, TID BM, Jennye Boroughs, MD, 237 mL at 08/16/22 0935   furosemide (LASIX)  tablet 40 mg, 40 mg, Oral, Daily, Jennye Boroughs, MD, 40 mg at 08/16/22 0924   insulin aspart (novoLOG) injection 0-9 Units, 0-9 Units, Subcutaneous, TID WC, Jennye Boroughs, MD, 1 Units at 08/16/22 1651   iohexol (OMNIPAQUE) 9 MG/ML oral solution 500 mL, 500 mL, Oral, Q1H, Sindy Guadeloupe, MD, 500 mL at 08/16/22 1631   ipratropium (ATROVENT) nebulizer solution 0.5 mg, 0.5 mg, Nebulization, BID, Jennye Boroughs, MD, 0.5 mg at 08/16/22 0806   ipratropium-albuterol (DUONEB) 0.5-2.5 (3) MG/3ML nebulizer solution 3 mL, 3 mL, Nebulization, Q6H PRN, Jennye Boroughs, MD   leptospermum manuka honey (MEDIHONEY) paste 1 Application, 1 Application, Topical, Daily, Jennye Boroughs, MD, 1 Application at 49/44/96 1634   levothyroxine (SYNTHROID) tablet 125 mcg, 125 mcg, Oral, QAC breakfast, Jennye Boroughs, MD, 125 mcg at 08/16/22 0547   lisinopril (ZESTRIL) tablet 2.5 mg, 2.5 mg, Oral, Daily, Jennye Boroughs, MD, 2.5  mg at 08/16/22 0924   metoprolol tartrate (LOPRESSOR) tablet 25 mg, 25 mg, Oral, TID, Jennye Boroughs, MD, 25 mg at 08/16/22 1651   [DISCONTINUED] ciprofloxacin (CIPRO) IVPB 400 mg, 400 mg, Intravenous, Q12H, Last Rate: 200 mL/hr at 08/13/22 2225, 400 mg at 08/13/22 2225 **AND** metroNIDAZOLE (FLAGYL) IVPB 500 mg, 500 mg, Intravenous, Q12H, Jennye Boroughs, MD, Stopped at 08/16/22 0941   oxyCODONE-acetaminophen (PERCOCET/ROXICET) 5-325 MG per tablet 1 tablet, 1 tablet, Oral, Q6H PRN, Jennye Boroughs, MD, 1 tablet at 08/16/22 0552   sertraline (ZOLOFT) tablet 100 mg, 100 mg, Oral, Daily, Jennye Boroughs, MD, 100 mg at 08/16/22 0924   sodium chloride flush (NS) 0.9 % injection 5 mL, 5 mL, Intracatheter, Q8H, Jennye Boroughs, MD, 5 mL at 08/16/22 1451   spironolactone (ALDACTONE) tablet 12.5 mg, 12.5 mg, Oral, Daily, Jennye Boroughs, MD, 12.5 mg at 08/16/22 0924   traMADol (ULTRAM) tablet 50 mg, 50 mg, Oral, Q6H PRN, Jennye Boroughs, MD, 50 mg at 08/14/22 2202   venlafaxine (EFFEXOR) tablet 37.5 mg, 37.5 mg, Oral, Daily, Jennye Boroughs, MD, 37.5 mg at 08/16/22 0925   Physical exam:  Vitals:   08/16/22 0412 08/16/22 0806 08/16/22 0920 08/16/22 1648  BP: 122/64  126/65 131/71  Pulse: 72 73 80 76  Resp: _0 Temp: 97.8 F (36.6 C)  97.7 F (36.5 C) 98 F (36.7 C)  TempSrc: Oral   Oral  SpO2: 98% 96% 97% 100%  Weight:      Height:       Physical Exam Constitutional:      General: She is not in acute distress. Cardiovascular:     Rate and Rhythm: Normal rate and regular rhythm.     Heart sounds: Normal heart sounds.  Pulmonary:     Effort: Pulmonary effort is normal.     Breath sounds: Normal breath sounds.  Abdominal:     General: Bowel sounds are normal.     Palpations: Abdomen is soft.     Comments: 2 intra-abdominal drains in place.  There is a midline central dressing in place and a colostomy  Skin:    General: Skin is warm and dry.  Neurological:     Mental Status: She is  alert and oriented to person, place, and time.           Latest Ref Rng & Units 08/15/2022    6:00 AM  CMP  Glucose 70 - 99 mg/dL 268   BUN 8 - 23 mg/dL 19   Creatinine 0.44 - 1.00 mg/dL 0.65   Sodium 135 - 145 mmol/L 133  Potassium 3.5 - 5.1 mmol/L 3.7   Chloride 98 - 111 mmol/L 97   CO2 22 - 32 mmol/L 29   Calcium 8.9 - 10.3 mg/dL 7.5       Latest Ref Rng & Units 08/15/2022    6:00 AM  CBC  WBC 4.0 - 10.5 K/uL 4.0   Hemoglobin 12.0 - 15.0 g/dL 9.1   Hematocrit 36.0 - 46.0 % 29.2   Platelets 150 - 400 K/uL 163     _0 @  CT GUIDED PERITONEAL/RETROPERITONEAL FLUID DRAIN BY PERC CATH  Result Date: 08/13/2022 CLINICAL DATA:  Status post Hartmann's procedure for diverticulitis of the sigmoid colon on 07/19/2022. Development postoperative abscesses in the left lateral abdomen adjacent to the descending colon and pelvis to the left of midline. EXAM: 1. CT GUIDED PERCUTANEOUS CATHETER DRAINAGE OF LEFT ABDOMINAL PERITONEAL ABSCESS 2. CT-GUIDED PERCUTANEOUS CATHETER DRAINAGE OF PELVIC PERITONEAL ABSCESS ANESTHESIA/SEDATION: Moderate (conscious) sedation was employed during this procedure. A total of Versed 1.0 mg and Fentanyl 50 mcg was administered intravenously by radiology nursing. Moderate Sedation Time: 45 minutes. The patient's level of consciousness and vital signs were monitored continuously by radiology nursing throughout the procedure under my direct supervision. PROCEDURE: The procedure, risks, benefits, and alternatives were explained to the patient. Questions regarding the procedure were encouraged and answered. The patient understands and consents to the procedure. A time out was performed prior to initiating the procedure. The operative field was prepped with chlorhexidine in a sterile fashion, and a sterile drape was applied covering the operative field. A sterile gown and sterile gloves were used for the procedure. Local anesthesia was provided with 1% Lidocaine. CT was  performed through the abdomen and pelvis in a supine position. Separate sites were marked along the left lateral abdominal wall and anterior lower left abdominal wall at the level of the pelvis. A 18 gauge trocar needle was advanced to the level of a left-sided peritoneal abscess located adjacent to the descending colon. After aspiration of fluid from the needle, a guidewire was advanced into the collection and the needle removed. The percutaneous tract was dilated over the wire and a 10 French percutaneous drain advanced over the wire. Catheter positioning was confirmed by CT. Additional fluid sample was aspirated from the drain and sent for culture analysis. The drainage catheter was flushed with sterile saline and connected to a suction bulb. A new 18 gauge trocar needle was then advanced from an anterior approach to the left of midline to the level of a left pelvic peritoneal abscess. After confirming needle tip position, a guidewire was advanced and the needle removed. The percutaneous tract was dilated over the wire and a 10 French percutaneous drainage catheter advanced. A fluid sample was withdrawn from the drain and sent for culture analysis. The drain was flushed with sterile saline and connected to a suction bulb. Both drainage catheters were secured at the skin with Prolene retention sutures and adhesive retention devices. Dressings were applied at the catheter exit sites. RADIATION DOSE REDUCTION: This exam was performed according to the departmental dose-optimization program which includes automated exposure control, adjustment of the mA and/or kV according to patient size and/or use of iterative reconstruction technique. COMPLICATIONS: None FINDINGS: Aspiration at the level of the left lateral pericolic abscess yielded grossly purulent fluid. After placement of a drainage catheter within the collection, there is good output of purulent fluid. After drain placement into the irregular cavity containing  air and fluid in the left pelvis, there was return initially  air followed by bloody fluid and debris. After placement of a drainage catheter within this collection there is output of bloody fluid. IMPRESSION: 1. CT-guided percutaneous catheter drainage of left lateral abdominal abscess yielding purulent fluid. A fluid sample was sent for culture analysis. A 10 French percutaneous drainage catheter was placed and attached to suction bulb drainage. 2. CT-guided percutaneous catheter drainage of pelvic abscess yielding bloody fluid and debris. A fluid sample was sent for culture analysis. A 10 French percutaneous drainage catheter was placed and attached to suction bulb drainage. Electronically Signed   By: Aletta Edouard M.D.   On: 08/13/2022 14:19   CT GUIDED PERITONEAL/RETROPERITONEAL FLUID DRAIN BY PERC CATH  Result Date: 08/13/2022 CLINICAL DATA:  Status post Hartmann's procedure for diverticulitis of the sigmoid colon on 07/19/2022. Development postoperative abscesses in the left lateral abdomen adjacent to the descending colon and pelvis to the left of midline. EXAM: 1. CT GUIDED PERCUTANEOUS CATHETER DRAINAGE OF LEFT ABDOMINAL PERITONEAL ABSCESS 2. CT-GUIDED PERCUTANEOUS CATHETER DRAINAGE OF PELVIC PERITONEAL ABSCESS ANESTHESIA/SEDATION: Moderate (conscious) sedation was employed during this procedure. A total of Versed 1.0 mg and Fentanyl 50 mcg was administered intravenously by radiology nursing. Moderate Sedation Time: 45 minutes. The patient's level of consciousness and vital signs were monitored continuously by radiology nursing throughout the procedure under my direct supervision. PROCEDURE: The procedure, risks, benefits, and alternatives were explained to the patient. Questions regarding the procedure were encouraged and answered. The patient understands and consents to the procedure. A time out was performed prior to initiating the procedure. The operative field was prepped with chlorhexidine in a  sterile fashion, and a sterile drape was applied covering the operative field. A sterile gown and sterile gloves were used for the procedure. Local anesthesia was provided with 1% Lidocaine. CT was performed through the abdomen and pelvis in a supine position. Separate sites were marked along the left lateral abdominal wall and anterior lower left abdominal wall at the level of the pelvis. A 18 gauge trocar needle was advanced to the level of a left-sided peritoneal abscess located adjacent to the descending colon. After aspiration of fluid from the needle, a guidewire was advanced into the collection and the needle removed. The percutaneous tract was dilated over the wire and a 10 French percutaneous drain advanced over the wire. Catheter positioning was confirmed by CT. Additional fluid sample was aspirated from the drain and sent for culture analysis. The drainage catheter was flushed with sterile saline and connected to a suction bulb. A new 18 gauge trocar needle was then advanced from an anterior approach to the left of midline to the level of a left pelvic peritoneal abscess. After confirming needle tip position, a guidewire was advanced and the needle removed. The percutaneous tract was dilated over the wire and a 10 French percutaneous drainage catheter advanced. A fluid sample was withdrawn from the drain and sent for culture analysis. The drain was flushed with sterile saline and connected to a suction bulb. Both drainage catheters were secured at the skin with Prolene retention sutures and adhesive retention devices. Dressings were applied at the catheter exit sites. RADIATION DOSE REDUCTION: This exam was performed according to the departmental dose-optimization program which includes automated exposure control, adjustment of the mA and/or kV according to patient size and/or use of iterative reconstruction technique. COMPLICATIONS: None FINDINGS: Aspiration at the level of the left lateral pericolic  abscess yielded grossly purulent fluid. After placement of a drainage catheter within the collection, there is good  output of purulent fluid. After drain placement into the irregular cavity containing air and fluid in the left pelvis, there was return initially air followed by bloody fluid and debris. After placement of a drainage catheter within this collection there is output of bloody fluid. IMPRESSION: 1. CT-guided percutaneous catheter drainage of left lateral abdominal abscess yielding purulent fluid. A fluid sample was sent for culture analysis. A 10 French percutaneous drainage catheter was placed and attached to suction bulb drainage. 2. CT-guided percutaneous catheter drainage of pelvic abscess yielding bloody fluid and debris. A fluid sample was sent for culture analysis. A 10 French percutaneous drainage catheter was placed and attached to suction bulb drainage. Electronically Signed   By: Aletta Edouard M.D.   On: 08/13/2022 14:19   CT HEAD WO CONTRAST (5MM)  Result Date: 08/12/2022 CLINICAL DATA:  Altered mental status EXAM: CT HEAD WITHOUT CONTRAST TECHNIQUE: Contiguous axial images were obtained from the base of the skull through the vertex without intravenous contrast. RADIATION DOSE REDUCTION: This exam was performed according to the departmental dose-optimization program which includes automated exposure control, adjustment of the mA and/or kV according to patient size and/or use of iterative reconstruction technique. COMPARISON:  09/16/2017 FINDINGS: Brain: No acute intracranial findings are seen. There are no signs of bleeding within the cranium. Cortical sulci are prominent. Ventricles are not dilated. There is subtle decreased density in periventricular white matter and basal ganglia. Vascular: There are scattered arterial calcifications. Skull: Unremarkable. Sinuses/Orbits: Defects in the medial walls of both maxillary sinuses may be postsurgical. Other: None. IMPRESSION: No acute  intracranial findings are seen.  Atrophy. Electronically Signed   By: Elmer Picker M.D.   On: 08/12/2022 17:57   CT ANGIO GI BLEED  Result Date: 08/11/2022 CLINICAL DATA:  Lower GI bleeding. Bright red blood per rectum. Colostomy. EXAM: CTA ABDOMEN AND PELVIS WITHOUT AND WITH CONTRAST TECHNIQUE: Multidetector CT imaging of the abdomen and pelvis was performed using the standard protocol during bolus administration of intravenous contrast. Multiplanar reconstructed images and MIPs were obtained and reviewed to evaluate the vascular anatomy. RADIATION DOSE REDUCTION: This exam was performed according to the departmental dose-optimization program which includes automated exposure control, adjustment of the mA and/or kV according to patient size and/or use of iterative reconstruction technique. CONTRAST:  34mL OMNIPAQUE IOHEXOL 350 MG/ML SOLN COMPARISON:  CT abdomen and pelvis 06/14/2022 FINDINGS: VASCULAR Aorta: Normal caliber aorta without aneurysm, dissection, vasculitis or significant stenosis. Moderate calcified atherosclerotic disease seen throughout the aorta. Celiac: Patent without evidence of aneurysm, dissection, vasculitis or significant stenosis. SMA: Patent without evidence of aneurysm, dissection, vasculitis or significant stenosis. Renals: Both renal arteries are patent without evidence of aneurysm, dissection, vasculitis, fibromuscular dysplasia or significant stenosis. Accessory left renal artery present. 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 Artery is otherwise widely patent. Inflow: Patent without evidence of aneurysm, dissection, vasculitis or significant stenosis. Proximal Outflow: Bilateral common femoral and visualized portions of the superficial and profunda femoral arteries are patent without evidence of aneurysm, dissection, vasculitis or significant stenosis. Veins: No obvious venous abnormality within the  limitations of this arterial phase study. Review of the MIP images confirms the above findings. NON-VASCULAR Lower chest: There are small bilateral pleural effusions with compressive atelectasis in the lung bases, left greater than right. Hepatobiliary: No focal liver abnormality is seen. Status post cholecystectomy. No biliary dilatation. Pancreas: Unremarkable. No pancreatic ductal dilatation or surrounding inflammatory changes. Spleen: Normal in size without  focal abnormality. Adrenals/Urinary Tract: The bladder is markedly distended. There is mild bilateral hydronephrosis without obstructing calculi identified. No perinephric stranding or fluid collection. Single 4 mm nonobstructing right renal calculus. Adrenal glands are within normal limits. Stomach/Bowel: There is a new left lower quadrant colostomy. The colon proximal to the colostomy demonstrates mild wall thickening (descending colon) without pneumatosis. Small bowel and stomach are within normal limits. The distal sigmoid colon and rectum are fluid distended, but otherwise unremarkable. There are several air-fluid collections in the pelvis which may or may not be bowel loops. Evaluation is limited secondary to lack of oral contrast in this region. The largest air-fluid collection is in the left lower quadrant measuring 6.8 x 5.6 by 5.2 cm (image 10/172). This region is suspicious for abscess and abuts the sigmoid colon staple line image 7/176. There is a new enhancing air-fluid collection in the left paracolic gutter worrisome for abscess measuring 3.3 by 3.7 by 10.8 cm image 10/104. No active gastrointestinal bleeding identified. Lymphatic: No enlarged lymph nodes are seen. Reproductive: Uterus is not visualized. Other: There is no ascites. There is new diffuse body wall edema. Small fat containing umbilical hernia is again seen. There is a new midline abdominal wall wound without discrete focal fluid collection. There is a new left-sided ostomy which  appears within normal limits. No free intraperitoneal air. Musculoskeletal: Degenerative changes affect the spine. IMPRESSION: VASCULAR 1. No active gastrointestinal bleeding identified. 2. No evidence for acute arterial abnormality. 3.  Aortic Atherosclerosis (ICD10-I70.0). NON-VASCULAR 1. New left lower quadrant colostomy present. No evidence for bowel obstruction. Mild wall thickening of the descending colon proximal to the ostomy may represent nonspecific colitis. 2. New air-fluid collection in the left paracolic gutter worrisome for abscess measuring 3.3 x 3.7 x 10.8 cm. 3. Findings suspicious for air-fluid collection abutting the sigmoid colon staple line measuring 6.8 x 5.6 x 5.2 cm. Findings may represent bowel leak and/or abscess. 4. Mild bilateral hydronephrosis likely secondary to markedly distended bladder. Recommend clinical correlation and follow-up. 5. Diffuse body wall edema. 6. Nonobstructing right renal calculus. 7. Small pleural effusions. Electronically Signed   By: Ronney Asters M.D.   On: 08/11/2022 16:20   CARDIAC CATHETERIZATION  Result Date: 08/04/2022   Mid Cx-1 lesion is 100% stenosed.   Mid LAD lesion is 75% stenosed.   Prox RCA to Dist RCA lesion is 25% stenosed with 25% stenosed side branch in RPDA.   Dist LAD lesion is 50% stenosed.   Ost LM lesion is 25% stenosed.   Mid Cx-2 lesion is 100% stenosed.   There is moderate to severe left ventricular systolic dysfunction.   LV end diastolic pressure is moderately elevated.   The left ventricular ejection fraction is 25-35% by visual estimate. Conclusion Inpatient cardiac cath secondary to non-STEMI Left ventriculogram Moderate to severely dilated left ventricular chamber Moderate to severely left ventricular depressed function between 25 to 30% Overly depressed Coronaries Left main mild disease 25% ostial nonobstructive moderate calcification LAD large diffuse minor irregularities with a 75% mid lesion 50% distal TIMI-3 flow Circumflex  large 100% mid occlusion IRA TIMI 0 flow RCA moderate to large diffuse minor irregularities Right dominant system Faint collaterals right to left to circumflex Intervention deferred Recommend heart failure therapy cardiomyopathy therapy Occluded mid circumflex does not appear to be particularly amenable to CTO intervention, but will consider at a later time once patient is medically stable and no further GI bleeding. Recommend only aspirin for now consider Plavix for 1 to  3 months but there is some concerned about increasing the risk of GI bleeding because patient is on Eliquis for atrial fibrillation Recommend cardiomyopathy heart failure management Entresto ARB or ACE low-dose beta-blocker spironolactone diuretics as tolerated Follow-up with cardiology as an outpatient for further management  ECHOCARDIOGRAM LIMITED  Result Date: 07/27/2022    ECHOCARDIOGRAM LIMITED REPORT   Patient Name:   TAJAE MAIOLO Odom Date of Exam: 07/26/2022 Medical Rec #:  [id number]   Height:       63.0 in Accession #:    [id number]  Weight:       155.0 lb Date of Birth:  [birth year]   BSA:          1.735 m Patient Age:    68 years    BP:           123/77 mmHg Patient Gender: F           HR:           98 bpm. Exam Location:  [facility] Procedure: Limited Echo, Cardiac Doppler and Color Doppler Indications:     NSTEMI I21.4  History:         Patient has prior history of Echocardiogram examinations, most                  recent 06/19/2022. Risk Factors:Hypertension and Diabetes.                  Cancer.  Sonographer:     Sherrie Sport Referring Phys:  [id number] [place] TANG Diagnosing Phys: Yolonda Kida MD IMPRESSIONS  1. Left ventricular ejection fraction, by estimation, is 25 to 30%. The left ventricle has severely decreased function. The left ventricle demonstrates global hypokinesis. The left ventricular internal cavity size was mildly dilated. Left ventricular diastolic function could not be evaluated.  2. The right ventricular size is  mildly enlarged.  3. Left atrial size was mildly dilated.  4. Right atrial size was mildly dilated.  5. The mitral valve is normal in structure. FINDINGS  Left Ventricle: Left ventricular ejection fraction, by estimation, is 25 to 30%. The left ventricle has severely decreased function. The left ventricle demonstrates global hypokinesis. The left ventricular internal cavity size was mildly dilated. There is no left ventricular hypertrophy. Left ventricular diastolic function could not be evaluated. Right Ventricle: The right ventricular size is mildly enlarged. No increase in right ventricular wall thickness. Left Atrium: Left atrial size was mildly dilated. Right Atrium: Right atrial size was mildly dilated. Pericardium: There is no evidence of pericardial effusion. Mitral Valve: The mitral valve is normal in structure. Tricuspid Valve: The tricuspid valve is normal in structure. Tricuspid valve regurgitation is mild. Pulmonic Valve: The pulmonic valve was normal in structure. Pulmonic valve regurgitation is trivial. Aorta: The ascending aorta was not well visualized. LEFT VENTRICLE PLAX 2D LVIDd:         5.20 cm LVIDs:         4.80 cm LV PW:         1.00 cm LV IVS:        1.10 cm  LV Volumes (MOD) LV vol d, MOD A2C: 108.0 ml LV vol d, MOD A4C: 126.0 ml LV vol s, MOD A2C: 83.3 ml LV vol s, MOD A4C: 97.7 ml LV SV MOD A2C:     24.7 ml LV SV MOD A4C:     126.0 ml LV SV MOD BP:      25.7 ml LEFT ATRIUM  Index LA diam:    4.70 cm 2.71 cm/m   AORTA Ao Root diam: 2.90 cm TRICUSPID VALVE TR Peak grad:   30.9 mmHg TR Vmax:        278.00 cm/s Yolonda Kida MD Electronically signed by Yolonda Kida MD Signature Date/Time: 07/27/2022/7:41:23 AM    Final    DG Chest Port 1 View  Result Date: 07/26/2022 CLINICAL DATA:  Acute respiratory failure and hypoxia. EXAM: PORTABLE CHEST 1 VIEW COMPARISON:  07/25/2022 FINDINGS: There is a right arm PICC line with tip in the right atrium. Stable cardiomediastinal  contours. Bilateral multifocal airspace opacities are unchanged in the interval. No new findings. IMPRESSION: No change in aeration to the lungs compared with previous exam. Electronically Signed   By: Kerby Moors M.D.   On: 07/26/2022 11:05   DG Chest Port 1 View  Result Date: 07/25/2022 CLINICAL DATA:  Acute respiratory failure EXAM: PORTABLE CHEST 1 VIEW COMPARISON:  07/24/2022 FINDINGS: Cardiac shadow is enlarged but stable. Right PICC is seen deep in the right atrium but stable. Patchy airspace opacities are again identified bilaterally but mildly improved when compared with the prior study. No bony abnormality is noted. IMPRESSION: Slight improved aeration when compared with the previous exam. Electronically Signed   By: Inez Catalina M.D.   On: 07/25/2022 04:10   DG Chest Port 1 View  Result Date: 07/24/2022 CLINICAL DATA:  Acute respiratory failure EXAM: PORTABLE CHEST 1 VIEW COMPARISON:  July 23, 2022 FINDINGS: A right PICC line terminates near the caval atrial junction. The cardiomediastinal silhouette is stable. Bilateral pulmonary opacities, particularly in the perihilar regions, have worsened. Probable skin fold over the lateral right lung with lung markings on both sides. No convincing evidence of pneumothorax. Decreased lung volumes. No other acute abnormalities. IMPRESSION: 1. The linear stripe over the lateral right mid chest is likely a skin fold as there are lung markings on both sides. No convincing evidence of pneumothorax. Recommend attention on follow-up. 2. Stable right PICC line. 3. Increasing bilateral perihilar opacities. While pulmonary edema is possible, the findings could represent a multifocal infectious process/pneumonia. Recommend clinical correlation and attention on follow-up. Electronically Signed   By: Dorise Bullion III M.D.   On: 07/24/2022 07:52   DG Chest Port 1 View  Result Date: 07/23/2022 CLINICAL DATA:  Hypoxic respiratory failure. EXAM: PORTABLE CHEST 1  VIEW COMPARISON:  Portable chest yesterday at 8:27 a.m. FINDINGS: 4:46 a.m. Right PICC tip remains in the upper right atrium. Moderate cardiomegaly is stable. Today there is increased perihilar vascular congestion and central interstitial edema. There are stable small pleural effusions and overlying consolidation or atelectasis, denser on the left. There is increased perihilar opacity on the right which could be atelectasis, infiltrate or ground-glass edema. The more cephalad lungs remain clear. In all other respects no further changes. There is no pneumothorax.  Aortic atherosclerosis. IMPRESSION: 1. Increased perihilar vascular congestion and mild central interstitial edema. 2. Similar layering pleural effusions and overlying lung opacities. 3. Increased right mid perihilar opacity. Electronically Signed   By: Telford Nab M.D.   On: 07/23/2022 06:21   DG Chest Port 1 View  Result Date: 07/22/2022 CLINICAL DATA:  Status post sigmoid colectomy. EXAM: PORTABLE CHEST 1 VIEW COMPARISON:  July 21, 2022. FINDINGS: Stable cardiomegaly. Interval placement of right-sided PICC line with distal tip in expected position of cavoatrial junction. Mild bibasilar subsegmental atelectasis is noted. Bony thorax is unremarkable. IMPRESSION: Interval placement of right-sided PICC line. Mild bibasilar  subsegmental atelectasis is noted with probable minimal pleural effusions. Electronically Signed   By: Marijo Conception M.D.   On: 07/22/2022 08:47   [place] EKG SITE RITE  Result Date: 07/21/2022 If Site Rite image not attached, placement could not be confirmed due to current cardiac rhythm.  DG Abd 1 View  Result Date: 07/21/2022 CLINICAL DATA:  [id number].  Abdominal pain EXAM: ABDOMEN - 1 VIEW COMPARISON:  06/21/2015 FINDINGS: Multiple surgical skin staples overlie the lower abdomen. Normal abdominal gas pattern. No gross free intraperitoneal gas. Cholecystectomy clips are seen in the right upper quadrant. Surgical drain  overlies the mid pelvis. No organomegaly. Osseous structures are age-appropriate. IMPRESSION: Normal abdominal gas pattern. Electronically Signed   By: Fidela Salisbury M.D.   On: 07/21/2022 00:36   DG Chest 1 View  Result Date: 07/21/2022 CLINICAL DATA:  Abdominal pain.  [id number] EXAM: CHEST  1 VIEW COMPARISON:  02/23/2021 FINDINGS: Lungs volumes are small, but are symmetric and are clear. No pneumothorax or pleural effusion. Cardiac size within normal limits. Pulmonary vascularity is normal. Osseous structures are age-appropriate. No acute bone abnormality. IMPRESSION: No active disease. Electronically Signed   By: Fidela Salisbury M.D.   On: 07/21/2022 00:35   ECHOCARDIOGRAM COMPLETE  Result Date: 07/20/2022    ECHOCARDIOGRAM REPORT   Patient Name:   CRIS GIBBY Date of Exam: 07/20/2022 Medical Rec #:  [id number]   Height:       63.0 in Accession #:    [id number]  Weight:       122.0 lb Date of Birth:  Apr 10, 1946   BSA:          1.567 m Patient Age:    35 years    BP:           105/73 mmHg Patient Gender: F           HR:           126 bpm. Exam Location:  [facility] Procedure: 2D Echo, Color Doppler and Cardiac Doppler Indications:     I21.4 NSTEMI  History:         Patient has no prior history of Echocardiogram examinations.                  Arrythmias:Tachycardia; Risk Factors:Hypertension, Diabetes and                  Dyslipidemia.  Sonographer:     Charmayne Sheer Referring Phys:  [id number] [place] TANG Diagnosing Phys: Yolonda Kida MD  Sonographer Comments: No subcostal window. IMPRESSIONS  1. Left ventricular ejection fraction, by estimation, is 45 to 50%. The left ventricle has mildly decreased function. The left ventricle demonstrates global hypokinesis. The left ventricular internal cavity size was mildly to moderately dilated. Left ventricular diastolic parameters were normal.  2. Right ventricular systolic function is normal. The right ventricular size is normal.  3. The mitral valve is normal in  structure. No evidence of mitral valve regurgitation.  4. The aortic valve is normal in structure. Aortic valve regurgitation is not visualized. FINDINGS  Left Ventricle: Left ventricular ejection fraction, by estimation, is 45 to 50%. The left ventricle has mildly decreased function. The left ventricle demonstrates global hypokinesis. The left ventricular internal cavity size was mildly to moderately dilated. There is borderline left ventricular hypertrophy. Left ventricular diastolic parameters were normal. Right Ventricle: The right ventricular size is normal. No increase in right ventricular wall thickness. Right ventricular systolic function is normal. Left Atrium:  Left atrial size was normal in size. Right Atrium: Right atrial size was normal in size. Pericardium: There is no evidence of pericardial effusion. Mitral Valve: The mitral valve is normal in structure. No evidence of mitral valve regurgitation. Tricuspid Valve: The tricuspid valve is normal in structure. Tricuspid valve regurgitation is trivial. Aortic Valve: The aortic valve is normal in structure. Aortic valve regurgitation is not visualized. Aortic valve mean gradient measures 5.0 mmHg. Aortic valve peak gradient measures 9.0 mmHg. Aortic valve area, by VTI measures 2.19 cm. Pulmonic Valve: The pulmonic valve was normal in structure. Pulmonic valve regurgitation is not visualized. Aorta: The ascending aorta was not well visualized. IAS/Shunts: No atrial level shunt detected by color flow Doppler.  LEFT VENTRICLE PLAX 2D LVIDd:         5.24 cm   Diastology LVIDs:         3.95 cm   LV e' medial:    4.79 cm/s LV PW:         1.53 cm   LV E/e' medial:  17.3 LV IVS:        0.93 cm   LV e' lateral:   7.51 cm/s LVOT diam:     2.00 cm   LV E/e' lateral: 11.1 LV SV:         51 LV SV Index:   32 LVOT Area:     3.14 cm  RIGHT VENTRICLE RV Basal diam:  2.79 cm RV S prime:     26.90 cm/s LEFT ATRIUM           Index        RIGHT ATRIUM           Index LA  diam:      3.80 cm 2.42 cm/m   RA Area:     15.10 cm LA Vol (A2C): 33.8 ml 21.57 ml/m  RA Volume:   38.80 ml  24.76 ml/m  AORTIC VALVE AV Area (Vmax):    1.94 cm AV Area (Vmean):   1.77 cm AV Area (VTI):     2.19 cm AV Vmax:           150.00 cm/s AV Vmean:          110.000 cm/s AV VTI:            0.232 m AV Peak Grad:      9.0 mmHg AV Mean Grad:      5.0 mmHg LVOT Vmax:         92.70 cm/s LVOT Vmean:        61.900 cm/s LVOT VTI:          0.162 m LVOT/AV VTI ratio: 0.70  AORTA Ao Root diam: 3.10 cm MITRAL VALVE MV Area (PHT): 10.25 cm   SHUNTS MV Decel Time: 74 msec     Systemic VTI:  0.16 m MV E velocity: 83.05 cm/s  Systemic Diam: 2.00 cm MV A velocity: 78.65 cm/s MV E/A ratio:  1.06 Dwayne D Callwood MD Electronically signed by Yolonda Kida MD Signature Date/Time: 07/20/2022/5:44:47 PM    Final     Assessment and plan- Patient is a 76 y.o. female admitted with diverticulitis complicated by intra-abdominal abscesses and incidentally found colon cancer  I met with patient and her sister-in-law at the bedside.  I discussed the results of pathology with the patient and her family in detail.  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  T3N0.  Ideally more than 12 lymph nodes need to be sampled.  However given that this was an incidental finding only 9 lymph nodes were sampled and they were negative for malignancy.  Patient does not have any other risk factors such as perineural or lymphovascular invasion.  There was a focus of adenocarcinoma seen in the area of pericolonic abscess and therefore T4 disease was not ruled out.  However patient has had multiple complications including intra-abdominal abscesses and ongoing Pseudomonas infection postsurgery.  I did not recommend any adjuvant chemotherapy as such for her given that she has stage II colon cancer and still recovering from ongoing complications.  I would recommend getting a CT  chest abdomen and pelvis with contrast done to complete her staging work-up and I will order CEA for tomorrow.  I will follow-up with her as an outpatient and all she would need at this time would be surveillance scans every 6 months.   Thank you for this kind referral and the opportunity to participate in the care of this patient   Visit Diagnosis 1. Rectal bleeding   2. Postoperative intra-abdominal abscess     Dr. Randa Evens, MD, MPH [facility] [phone] 08/16/2022

## 2022-08-17 DIAGNOSIS — I10 Essential (primary) hypertension: Secondary | ICD-10-CM

## 2022-08-17 LAB — GLUCOSE, CAPILLARY
Glucose-Capillary: 100 mg/dL — ABNORMAL HIGH (ref 70–99)
Glucose-Capillary: 125 mg/dL — ABNORMAL HIGH (ref 70–99)

## 2022-08-17 LAB — CEA: CEA: 9.8 ng/mL — ABNORMAL HIGH (ref 0.0–4.7)

## 2022-08-17 NOTE — Progress Notes (Signed)
PROGRESS NOTE    Sarah [name]  POE:[id number] DOB: [birth year] DOA: 08/11/2022 PCP: Rusty Aus, MD    Assessment & Plan:   Principal Problem:   Rectal bleeding Active Problems:   Protein-calorie malnutrition, severe   Uncontrolled type 2 diabetes mellitus with hyperglycemia, without long-term current use of insulin (HCC)   Pressure injury of skin   Hyponatremia   Hypothyroidism   AKI (acute kidney injury) ([place])   CAD (coronary artery disease)   Chronic systolic CHF (congestive heart failure) (HCC)   Postoperative intra-abdominal abscess   Depression   Paroxysmal atrial fibrillation (HCC)   Hypoglycemia   Hypotension  Assessment and Plan: Midline abdominal pseudomonas open wound infection: from recent surgery, intra-abdominal abscess. S/p Hartman's procedure for perforated diverticulitis and colon cancer on 07/23/2022. S/p placement of 10 French drain in pelvic abscess with return of purulent fluid on 08/13/2022.  Wound culture growing enterococcus faecalis, streptococcus anginosis & candida tropicalis. Continue on IV cefepime, flagyl while inpatient home and d/c on po cipro & fluconazole    Rectal bleeding/rectal discharge: No active bleeding   DM2: likely well controlled. Continue on SSI w/ accuchecks    Hyponatremia: Improved   Thrombocytopenia: resolved  AKI: resolved   Acute metabolic encephalopathy: mental status is back to baseline   Chronic systolic CHF: continue on lasix, lisinopril & aldactone.   Hx of CAD: w/ recent hx of NSTEMI s/p cardiac cath on 07/29/22.   HTN: continue on lisinopril, aldactone   PAF: continue on eliquis    Stage II sacral decubitus ulcer: continue w/ wound care  Hypothyroidism: continue on levothyroxine   HLD: continue on statin     DVT prophylaxis: eliquis Code Status: DNR Family Communication:  Disposition Plan: d/c to SNF  Level of care: Med-Surg  Status is: Inpatient Remains inpatient appropriate because: d/c to SNF  today     Consultants:  Hospitalist   Procedures:   Antimicrobials: cipro    Subjective: Pt denies any complaints   Objective: Vitals:   08/16/22 1933 08/16/22 1956 08/17/22 0426 08/17/22 0801  BP: 121/67  127/66 130/74  Pulse: 71  71 77  Resp: '16  15 14  '$ Temp: 98.1 F (36.7 C)  98 F (36.7 C) 98.2 F (36.8 C)  TempSrc: Oral  Oral Oral  SpO2: 99% 97% 98% 97%  Weight:      Height:        Intake/Output Summary (Last 24 hours) at 08/17/2022 0827 Last data filed at 08/17/2022 0340 Gross per 24 hour  Intake 217.97 ml  Output 1370 ml  Net -1152.03 ml   Filed Weights   08/11/22 1502  Weight: 55.7 kg    Examination:  General exam: Appears calm and comfortable  Respiratory system: Clear to auscultation. Respiratory effort normal. Cardiovascular system: S1 & S2+. No rubs, gallops or clicks.  Gastrointestinal system: Abdomen is nondistended, soft and nontender.  Normal bowel sounds heard. Central nervous system: Alert and oriented. Moves all extremities Psychiatry: Judgement and insight appear normal. Flat mood and affect     Data Reviewed: I have personally reviewed following labs and imaging studies  CBC: Recent Labs  Lab 08/11/22 1505 08/12/22 0001 08/12/22 0413 08/13/22 0650 08/15/22 0600  WBC 6.1  --  8.8 4.8 4.0  NEUTROABS 5.7  --   --  3.8 3.0  HGB 9.5* 10.2* 9.8* 9.1* 9.1*  HCT 29.5* 32.6* 30.6* 29.0* 29.2*  MCV 90.8  --  91.1 93.5 93.0  PLT 148*  --  182 144* 814   Basic Metabolic Panel: Recent Labs  Lab 08/11/22 1505 08/12/22 0413 08/13/22 0650 08/15/22 0600  NA 128* 132* 133* 133*  K 4.5 3.8 3.6 3.7  CL 89* 93* 95* 97*  CO2 '28 28 30 29  '$ GLUCOSE 398* 223* 91 268*  BUN 47* 38* 26* 19  CREATININE 1.54* 1.07* 0.70 0.65  CALCIUM 8.4* 8.4* 7.8* 7.5*   GFR: Estimated Creatinine Clearance: 49.5 mL/min (by C-G formula based on SCr of 0.65 mg/dL). Liver Function Tests: Recent Labs  Lab 08/11/22 1505 08/12/22 0413  AST 30 21  ALT 26 23   ALKPHOS 67 63  BILITOT 0.8 0.7  PROT 5.9* 5.9*  ALBUMIN 2.2* 2.2*   No results for input(s): "LIPASE", "AMYLASE" in the last 168 hours. No results for input(s): "AMMONIA" in the last 168 hours. Coagulation Profile: Recent Labs  Lab 08/11/22 1511  INR 1.2   Cardiac Enzymes: No results for input(s): "CKTOTAL", "CKMB", "CKMBINDEX", "TROPONINI" in the last 168 hours. BNP (last 3 results) No results for input(s): "PROBNP" in the last 8760 hours. HbA1C: No results for input(s): "HGBA1C" in the last 72 hours. CBG: Recent Labs  Lab 08/16/22 0747 08/16/22 1204 08/16/22 1633 08/16/22 2110 08/17/22 0751  GLUCAP 150* 220* 135* 104* 100*   Lipid Profile: No results for input(s): "CHOL", "HDL", "LDLCALC", "TRIG", "CHOLHDL", "LDLDIRECT" in the last 72 hours. Thyroid Function Tests: No results for input(s): "TSH", "T4TOTAL", "FREET4", "T3FREE", "THYROIDAB" in the last 72 hours. Anemia Panel: No results for input(s): "VITAMINB12", "FOLATE", "FERRITIN", "TIBC", "IRON", "RETICCTPCT" in the last 72 hours. Sepsis Labs: No results for input(s): "PROCALCITON", "LATICACIDVEN" in the last 168 hours.  Recent Results (from the past 240 hour(s))  Aerobic/Anaerobic Culture w Gram Stain (surgical/deep wound)     Status: None (Preliminary result)   Collection Time: 08/13/22 12:45 PM   Specimen: Abscess  Result Value Ref Range Status   Specimen Description   Final    ABSCESS Performed at [facility], [address] [place], [place] [address]    Special Requests   Final    Normal Performed at [facility], [address] [place], [place] [address]    Gram Stain   Final    MODERATE WBC PRESENT, PREDOMINANTLY MONONUCLEAR FEW GRAM POSITIVE COCCI IN PAIRS FEW GRAM POSITIVE RODS FEW GRAM NEGATIVE RODS Performed at [facility], [place] [address] [place], [place] [address]    Culture   Final    ABUNDANT ENTEROCOCCUS FAECALIS ABUNDANT STREPTOCOCCUS  ANGINOSIS SUSCEPTIBILITIES TO FOLLOW FEW CANDIDA TROPICALIS NO ANAEROBES ISOLATED; CULTURE IN PROGRESS FOR 5 DAYS    Report Status PENDING  Incomplete  Aerobic/Anaerobic Culture w Gram Stain (surgical/deep wound)     Status: None (Preliminary result)   Collection Time: 08/13/22 12:46 PM   Specimen: Abscess  Result Value Ref Range Status   Specimen Description   Final    ABSCESS Performed at [facility], [address], [place], [place] [address]    Special Requests   Final    Normal Performed at [facility], [address] [place], [place] [address]    Gram Stain   Final    ABUNDANT WBC PRESENT,BOTH PMN AND MONONUCLEAR NO ORGANISMS SEEN Performed at [facility], [place] [address], [place], [place] [address]    Culture   Final    FEW CANDIDA ALBICANS NO ANAEROBES ISOLATED; CULTURE IN PROGRESS FOR 5 DAYS    Report Status PENDING  Incomplete  Radiology Studies: CT CHEST ABDOMEN PELVIS W CONTRAST  Result Date: 08/16/2022 CLINICAL DATA:  Colon cancer, staging EXAM: CT CHEST, ABDOMEN, AND PELVIS WITH CONTRAST TECHNIQUE: Multidetector CT imaging of the chest, abdomen and pelvis was performed following the standard protocol during bolus administration of intravenous contrast. RADIATION DOSE REDUCTION: This exam was performed according to the departmental dose-optimization program which includes automated exposure control, adjustment of the mA and/or kV according to patient size and/or use of iterative reconstruction technique. CONTRAST:  161m OMNIPAQUE IOHEXOL 300 MG/ML  SOLN COMPARISON:  08/13/2022, 08/11/2022 FINDINGS: CT CHEST FINDINGS Cardiovascular: The heart is enlarged without pericardial effusion. No evidence of thoracic aortic aneurysm or dissection. Diffuse atherosclerosis of the aorta and coronary vasculature. Mediastinum/Nodes: No enlarged mediastinal, hilar, or axillary lymph nodes. Thyroid gland, trachea, and esophagus demonstrate no  significant findings. Lungs/Pleura: There are small bilateral pleural effusions and dependent lower lobe atelectasis, increased since prior exam. Areas of nodular airspace disease are seen throughout the mid and upper lung zones, likely inflammatory or infectious. Close follow-up is warranted to assess resolution in this patient with a history of colon cancer. No pneumothorax. The central airways are patent. Musculoskeletal: There are no acute or destructive bony lesions. Reconstructed images demonstrate no additional findings. CT ABDOMEN PELVIS FINDINGS Hepatobiliary: No focal liver abnormality is seen. Status post cholecystectomy. No biliary dilatation. Pancreas: Unremarkable. No pancreatic ductal dilatation or surrounding inflammatory changes. Spleen: Normal in size without focal abnormality. Adrenals/Urinary Tract: Stable 4 mm nonobstructing right renal calculus. Otherwise the kidneys are unremarkable. No obstructive uropathy. Lack of excretion of contrast on delayed imaging with persistent bilateral nephrogram could reflect underlying acute renal insufficiency. Please correlate with laboratory analysis. Bladder is partially decompressed with an indwelling Foley catheter. The adrenals are unremarkable. Stomach/Bowel: Left lower quadrant ostomy again identified, with no evidence of bowel obstruction or ileus. Stable mural thickening of the colon at the level of the ostomy. Postsurgical changes are seen from sigmoid colon resection. Remaining portions of the distal sigmoid colon and rectum are moderately distended without focal abnormality. Vascular/Lymphatic: Aortic atherosclerosis. No enlarged abdominal or pelvic lymph nodes. Reproductive: Status post hysterectomy. No adnexal masses. Other: There are 2 percutaneous pigtail drainage catheters. The more superior catheter is seen within the left paracolic gutter, with complete resolution of the fluid collection seen previously. The second catheter is placed in the  region of a multilocular collection in the pelvis. The largest portion of this multilocular collection is decompressed by the pigtail drainage catheter. However, numerous other smaller collections persist. These collections may be interconnected, and continued follow-up is recommended. Measurements are as follows:: Image 87/2, 5.2 x 3.5 cm. Image 94/2, 4.8 x 4.1 cm. Image 96/2, 4.4 x 1.6 cm. Postsurgical changes are seen from midline laparotomy, with healing anterior abdominal wound. Musculoskeletal: No acute or destructive bony lesions. Reconstructed images demonstrate no additional findings. IMPRESSION: 1. No evidence of metastatic disease. 2. Patchy bilateral nodular airspace disease most consistent with multifocal pneumonia or inflammation. Continued follow-up is recommended to ensure resolution and exclude metastatic disease. 3. Increased bilateral pleural effusions, with dependent lower lobe atelectasis. 4. Pigtail drainage catheters as above. The multilocular pelvic collection seen on prior study is partially decompressed by the drainage catheter, with remaining likely interconnected fluid collections as above. Continued follow-up is recommended. Complete resolution of the left paracolic collection after pigtail catheter placement. 5. Continued mural thickening of the colon at the ostomy site left lower quadrant, which may reflect postsurgical edema. No bowel obstruction or ileus. 6. Stable  nonobstructing right renal calculus. 7.  Aortic Atherosclerosis (ICD10-I70.0). Electronically Signed   By: Randa Ngo M.D.   On: 08/16/2022 19:44        Scheduled Meds:  amiodarone  200 mg Oral Daily   apixaban  2.5 mg Oral BID   atorvastatin  40 mg Oral QHS   Chlorhexidine Gluconate Cloth  6 each Topical Daily   feeding supplement  237 mL Oral TID BM   furosemide  40 mg Oral Daily   insulin aspart  0-9 Units Subcutaneous TID WC   leptospermum manuka honey  1 Application Topical Daily   levothyroxine   125 mcg Oral QAC breakfast   lisinopril  2.5 mg Oral Daily   metoprolol tartrate  25 mg Oral TID   sertraline  100 mg Oral Daily   sodium chloride flush  5 mL Intracatheter Q8H   spironolactone  12.5 mg Oral Daily   venlafaxine  37.5 mg Oral Daily   Continuous Infusions:  sodium chloride Stopped (08/15/22 1301)   ceFEPime (MAXIPIME) IV Stopped (08/16/22 2105)   metronidazole 500 mg (08/17/22 0815)     LOS: 6 days    Time spent: 30 mins    Wyvonnia Dusky, MD [facility] Pager [phone]  If 7PM-7AM, please contact night-coverage [email or web address]  08/17/2022, 8:27 AM

## 2022-08-17 NOTE — NC FL2 (Signed)
[place] LEVEL OF CARE SCREENING TOOL     IDENTIFICATION  Patient Name: Sarah [name] Birthdate: 08-May-1946 Sex: female Admission Date (Current Location): 08/11/2022  [facility] and [place] Number:  Engineering geologist and Address:         Provider Number: [id number]  Attending Physician Name and Address:  Benjamine Sprague, DO  Relative Name and Phone Number:       Current Level of Care: Hospital Recommended Level of Care: [place] Prior Approval Number:    Date Approved/Denied:   PASRR Number: [id number]  Discharge Plan: SNF    Current Diagnoses: Patient Active Problem List   Diagnosis Date Noted   Hypoglycemia 08/12/2022   Hypotension 08/12/2022   Rectal bleeding 08/11/2022   AKI (acute kidney injury) ([place]) 08/11/2022   CAD (coronary artery disease) 67/67/2094   Chronic systolic CHF (congestive heart failure) ([place]) 08/11/2022   Postoperative intra-abdominal abscess 08/11/2022   Depression 08/11/2022   Paroxysmal atrial fibrillation ([place]) 08/11/2022   Hypothyroidism    Protein-calorie malnutrition, severe 07/23/2022   Pressure injury of skin 07/23/2022   NSTEMI (non-ST elevated myocardial infarction) ([place]) 70/96/2836   Acute systolic CHF (congestive heart failure) ([place]) 07/23/2022   Atrial fibrillation with RVR ([place]) 07/23/2022   Electrolyte abnormality including hyponatremia, hypokalemia, hypophosphatemia and hypomagnesemia 07/23/2022   Hyponatremia 07/23/2022   Uncontrolled type 2 diabetes mellitus with hyperglycemia, without long-term current use of insulin ([place]) 07/23/2022   Hypokalemia 07/23/2022   Hypomagnesemia 07/23/2022   Diverticulitis 07/19/2022   Diverticulitis large intestine 05/28/2022    Orientation RESPIRATION BLADDER Height & Weight     Self, Situation, Place  Normal Indwelling catheter Weight: 55.7 kg Height:  '5\' 3"'$  (160 cm)  BEHAVIORAL SYMPTOMS/MOOD NEUROLOGICAL BOWEL NUTRITION STATUS      Colostomy Diet (2  gram sodium)  AMBULATORY STATUS COMMUNICATION OF NEEDS Skin   Extensive Assist Verbally PU Stage and Appropriate Care, Surgical wounds, Other (Comment) (daily packing dressing. JP drain x2)                       Personal Care Assistance Level of Assistance              Functional Limitations Info             SPECIAL CARE FACTORS FREQUENCY  PT (By licensed PT), OT (By licensed OT)                    Contractures Contractures Info: Not present    Additional Factors Info  Code Status, Allergies Code Status Info: full Allergies Info: enicillin, sulfa antibiotics           Current Medications (08/17/2022):  This is the current hospital active medication list Current Facility-Administered Medications  Medication Dose Route Frequency Provider Last Rate Last Admin   0.9 %  sodium chloride infusion   Intravenous PRN Lysle Pearl, Isami, DO   Stopped at 08/15/22 1301   acetaminophen (TYLENOL) tablet 650 mg  650 mg Oral Q4H PRN Jennye Boroughs, MD       albuterol (PROVENTIL) (2.5 MG/3ML) 0.083% nebulizer solution 2.5 mg  2.5 mg Nebulization Q6H PRN Jennye Boroughs, MD       amiodarone (PACERONE) tablet 200 mg  200 mg Oral Daily Jennye Boroughs, MD   200 mg at 08/17/22 0816   apixaban (ELIQUIS) tablet 2.5 mg  2.5 mg Oral BID Jennye Boroughs, MD   2.5 mg at 08/17/22 0817   atorvastatin (LIPITOR)  tablet 40 mg  40 mg Oral QHS Jennye Boroughs, MD   40 mg at 08/16/22 2037   ceFEPIme (MAXIPIME) 2 g in sodium chloride 0.9 % 100 mL IVPB  2 g Intravenous Q12H Jennye Boroughs, MD 200 mL/hr at 08/17/22 0927 2 g at 08/17/22 3557   Chlorhexidine Gluconate Cloth 2 % PADS 6 each  6 each Topical Daily Sakai, Isami, DO   6 each at 08/17/22 0817   feeding supplement (ENSURE ENLIVE / ENSURE PLUS) liquid 237 mL  237 mL Oral TID BM Jennye Boroughs, MD   237 mL at 08/16/22 0935   furosemide (LASIX) tablet 40 mg  40 mg Oral Daily Jennye Boroughs, MD   40 mg at 08/17/22 0817   insulin aspart (novoLOG)  injection 0-9 Units  0-9 Units Subcutaneous TID WC Jennye Boroughs, MD   1 Units at 08/16/22 1651   ipratropium-albuterol (DUONEB) 0.5-2.5 (3) MG/3ML nebulizer solution 3 mL  3 mL Nebulization Q6H PRN Jennye Boroughs, MD       leptospermum manuka honey (MEDIHONEY) paste 1 Application  1 Application Topical Daily Jennye Boroughs, MD   1 Application at 32/20/25 1634   levothyroxine (SYNTHROID) tablet 125 mcg  125 mcg Oral QAC breakfast Jennye Boroughs, MD   125 mcg at 08/17/22 0500   lisinopril (ZESTRIL) tablet 2.5 mg  2.5 mg Oral Daily Jennye Boroughs, MD   2.5 mg at 08/17/22 0817   metoprolol tartrate (LOPRESSOR) tablet 25 mg  25 mg Oral TID Jennye Boroughs, MD   25 mg at 08/17/22 0816   metroNIDAZOLE (FLAGYL) IVPB 500 mg  500 mg Intravenous Q12H Jennye Boroughs, MD 100 mL/hr at 08/17/22 0815 500 mg at 08/17/22 0815   oxyCODONE-acetaminophen (PERCOCET/ROXICET) 5-325 MG per tablet 1 tablet  1 tablet Oral Q6H PRN Jennye Boroughs, MD   1 tablet at 08/16/22 0552   sertraline (ZOLOFT) tablet 100 mg  100 mg Oral Daily Jennye Boroughs, MD   100 mg at 08/17/22 0816   sodium chloride flush (NS) 0.9 % injection 5 mL  5 mL Intracatheter Q8H Jennye Boroughs, MD   5 mL at 08/17/22 0501   spironolactone (ALDACTONE) tablet 12.5 mg  12.5 mg Oral Daily Jennye Boroughs, MD   12.5 mg at 08/17/22 0816   traMADol (ULTRAM) tablet 50 mg  50 mg Oral Q6H PRN Jennye Boroughs, MD   50 mg at 08/14/22 2202   venlafaxine (EFFEXOR) tablet 37.5 mg  37.5 mg Oral Daily Jennye Boroughs, MD   37.5 mg at 08/17/22 4270     Discharge Medications: Please see discharge summary for a list of discharge medications.  Relevant Imaging Results:  Relevant Lab Results:   Additional Information SS# [id number]  Beverly Sessions, RN

## 2022-08-17 NOTE — Evaluation (Signed)
Occupational Therapy Evaluation Patient Details Name: Sarah [name] MRN: [id number] DOB: 1946/09/07 Today's Date: 08/17/2022   History of Present Illness Pt is a 76 y.o. female with past medical history significant for Graves' disease status post radiation therapy, diabetes mellitus on oral agents, hypertension, history of diverticulitis with abscess formation status post CT-guided drain placement, recent hospitalization and status post partial sigmoidectomy with Hartman's pouch for complicated diverticulitis, history of paroxysmal A-fib, postoperative non-ST elevation MI, brought to ED for blood per rectum. She was found to have intra-abdominal abscess.   Clinical Impression   Patient seen for OT evaluation, family present in room. Patient presenting with abdominal pain, decreased strength, balance, and endurance impacting safety and independence in ADLs. Patient was independent in ADLs/IADLs prior to recent hospitalizations. Patient currently functioning Mod-Max A for supine <> sit, Max A LB dressing, and Min A UB dressing. Patient will benefit from acute OT to increase overall independence in the areas of ADLs and functional mobility in order to safely discharge to next venue of care. Upon hospital discharge, recommend STR to maximize pt safety and return to PLOF.   Recommendations for follow up therapy are one component of a multi-disciplinary discharge planning process, led by the attending physician.  Recommendations may be updated based on patient status, additional functional criteria and insurance authorization.   Follow Up Recommendations  Skilled nursing-short term rehab (<3 hours/day)    Assistance Recommended at Discharge Frequent or constant Supervision/Assistance  Patient can return home with the following A lot of help with bathing/dressing/bathroom;Help with stairs or ramp for entrance;Assist for transportation;Assistance with cooking/housework;A lot of help with walking and/or  transfers    Functional Status Assessment  Patient has had a recent decline in their functional status and demonstrates the ability to make significant improvements in function in a reasonable and predictable amount of time.  Equipment Recommendations  Other (comment) (defer to next venue of care)    Recommendations for Other Services       Precautions / Restrictions Precautions Precautions: Fall Precaution Comments: abdominal incision, colostomy, 2 JP drains Restrictions Weight Bearing Restrictions: No      Mobility Bed Mobility Overal bed mobility: Needs Assistance Bed Mobility: Supine to Sit, Sit to Supine     Supine to sit: Max assist, HOB elevated Sit to supine: Mod assist        Transfers                          Balance Overall balance assessment: Needs assistance Sitting-balance support: Feet supported, Single extremity supported Sitting balance-Leahy Scale: Fair Sitting balance - Comments: supervision static sitting                                   ADL either performed or assessed with clinical judgement   ADL Overall ADL's : Needs assistance/impaired     Grooming: Wash/dry face;Brushing hair;Set up;Sitting           Upper Body Dressing : Minimal assistance;Sitting Upper Body Dressing Details (indicate cue type and reason): to don gown Lower Body Dressing: Maximal assistance;Sitting/lateral leans Lower Body Dressing Details (indicate cue type and reason): to don socks     Toileting- Clothing Manipulation and Hygiene: Maximal assistance;Sitting/lateral lean Toileting - Clothing Manipulation Details (indicate cue type and reason): NT present to empty catheter       General ADL Comments: Pt deferring further ADL  tasks and mobility 2/2 abdominal pain     Vision Baseline Vision/History: 1 Wears glasses Patient Visual Report: No change from baseline       Perception     Praxis      Pertinent Vitals/Pain Pain  Assessment Pain Assessment: Faces Faces Pain Scale: Hurts even more Pain Location: abdomen Pain Descriptors / Indicators: Guarding, Aching, Grimacing, Crying, Discomfort Pain Intervention(s): Limited activity within patient's tolerance, Monitored during session, Repositioned, Patient requesting pain meds-RN notified     Hand Dominance Right   Extremity/Trunk Assessment Upper Extremity Assessment Upper Extremity Assessment: Generalized weakness   Lower Extremity Assessment Lower Extremity Assessment: Generalized weakness       Communication Communication Communication: No difficulties   Cognition Arousal/Alertness: Lethargic Behavior During Therapy: Flat affect Overall Cognitive Status: Impaired/Different from baseline Area of Impairment: Problem solving, Safety/judgement, Orientation, Attention, Memory, Awareness, Following commands                     Memory: Decreased short-term memory Following Commands: Follows one step commands with increased time Safety/Judgement: Decreased awareness of safety, Decreased awareness of deficits   Problem Solving: Requires verbal cues, Requires tactile cues, Slow processing General Comments: Family reports recent change in cognition - not as "sharp" as before. Able to follow all commands. Oriented to self and place, grossly to time. Asked repeatedly "where am I going?" and "where is Alveta Heimlich?" after discussion about D/C to [facility] and that her husband is still in hospice. Required min VC for alertness throughout, reported feeling "tired".     General Comments  Pt reporting dizziness once sitting EOB, however, improved with time. SpO2 97% on RA, HR 76.    Exercises Other Exercises Other Exercises: OT provided education re: role of OT, OT POC, post acute recs, sitting up for all meals, EOB/OOB mobility with assistance, home/fall safety.     Shoulder Instructions      Home Living Family/patient expects to be discharged to:: Private  residence Living Arrangements: Alone (spouse currently at [facility] for respite while patient in the hospital) Available Help at Discharge: Family;Available PRN/intermittently   Home Access: Ramped entrance                     Home Equipment: Rolling Walker (2 wheels);Cane - single point          Prior Functioning/Environment Prior Level of Function : Independent/Modified Independent             Mobility Comments: using single point cane, denies falls ADLs Comments: IND with ADLs/IADLs prior to recent hospitalizations        OT Problem List: Decreased strength;Decreased range of motion;Decreased activity tolerance;Impaired balance (sitting and/or standing);Decreased safety awareness;Pain      OT Treatment/Interventions: Therapeutic exercise;Self-care/ADL training;Energy conservation;DME and/or AE instruction;Therapeutic activities;Patient/family education;Balance training;Cognitive remediation/compensation    OT Goals(Current goals can be found in the care plan section) Acute Rehab OT Goals Patient Stated Goal: reduce pain, go to rehab OT Goal Formulation: With patient/family Time For Goal Achievement: 08/31/22 Potential to Achieve Goals: Fair ADL Goals Pt Will Perform Grooming: with supervision;sitting Pt Will Perform Upper Body Dressing: sitting;with supervision Pt Will Perform Lower Body Dressing: with min assist;sit to/from stand;sitting/lateral leans Pt Will Transfer to Toilet: ambulating;bedside commode;with mod assist Pt Will Perform Toileting - Clothing Manipulation and hygiene: with min assist;sit to/from stand  OT Frequency: Min 2X/week    Co-evaluation              AM-PAC OT "6  Clicks" Daily Activity     Outcome Measure Help from another person eating meals?: None Help from another person taking care of personal grooming?: A Little Help from another person toileting, which includes using toliet, bedpan, or urinal?: A Lot Help from another  person bathing (including washing, rinsing, drying)?: A Lot Help from another person to put on and taking off regular upper body clothing?: A Little Help from another person to put on and taking off regular lower body clothing?: A Lot 6 Click Score: 16   End of Session Nurse Communication: Mobility status  Activity Tolerance: Patient limited by pain;Patient limited by fatigue Patient left: in bed;with call bell/phone within reach;with bed alarm set;with family/visitor present  OT Visit Diagnosis: Other abnormalities of gait and mobility (R26.89);Muscle weakness (generalized) (M62.81);Pain                Time: 0340-3524 OT Time Calculation (min): 41 min Charges:  OT General Charges $OT Visit: 1 Visit OT Evaluation $OT Eval Low Complexity: 1 Low OT Treatments $Self Care/Home Management : 8-22 mins  [facility] MS, OTR/L ascom [phone]  08/17/22, 9:44 AM

## 2022-08-17 NOTE — Telephone Encounter (Signed)
Can patient take Xarelto and fish oil and a multiple vitamin.

## 2022-08-17 NOTE — Telephone Encounter (Signed)
Please call patient about a medication that the eye doctor wants her to take.  Please give her a call.  She would like a call today because she is suppose to start taking the medication today.  She can be reached at [phone]

## 2022-08-18 ENCOUNTER — Other Ambulatory Visit: Payer: Self-pay | Admitting: Surgery

## 2022-08-18 ENCOUNTER — Telehealth: Payer: Self-pay | Admitting: Urology

## 2022-08-18 DIAGNOSIS — K651 Peritoneal abscess: Secondary | ICD-10-CM

## 2022-08-18 LAB — AEROBIC/ANAEROBIC CULTURE W GRAM STAIN (SURGICAL/DEEP WOUND): Special Requests: NORMAL

## 2022-08-18 NOTE — Telephone Encounter (Signed)
Please schedule patient sometime next week for voiding trial/cath removal.  New patient, any provider   Left message for patient to call back to give her the new appt date and time. Her first appt was scheduled incorrectly. Sharyn Lull

## 2022-08-18 NOTE — Telephone Encounter (Signed)
Spoke with patient's sister and gave her the appt information. Sharyn Lull

## 2022-08-19 LAB — AEROBIC/ANAEROBIC CULTURE W GRAM STAIN (SURGICAL/DEEP WOUND): Special Requests: NORMAL

## 2022-08-22 ENCOUNTER — Inpatient Hospital Stay: Payer: Medicare Other | Attending: Oncology | Admitting: Oncology

## 2022-08-22 ENCOUNTER — Encounter: Payer: Self-pay | Admitting: Oncology

## 2022-08-22 VITALS — BP 132/88 | HR 98 | Temp 94.0°F | Resp 18

## 2022-08-22 DIAGNOSIS — C187 Malignant neoplasm of sigmoid colon: Secondary | ICD-10-CM | POA: Diagnosis present

## 2022-08-22 DIAGNOSIS — Z515 Encounter for palliative care: Secondary | ICD-10-CM | POA: Insufficient documentation

## 2022-08-22 DIAGNOSIS — Z7189 Other specified counseling: Secondary | ICD-10-CM

## 2022-08-22 DIAGNOSIS — R5383 Other fatigue: Secondary | ICD-10-CM | POA: Diagnosis not present

## 2022-08-22 DIAGNOSIS — C189 Malignant neoplasm of colon, unspecified: Secondary | ICD-10-CM | POA: Insufficient documentation

## 2022-08-22 DIAGNOSIS — Z933 Colostomy status: Secondary | ICD-10-CM | POA: Diagnosis not present

## 2022-08-22 NOTE — Progress Notes (Signed)
Patient here for oncology follow-up appointment, states "she just doesn't feel good".

## 2022-08-22 NOTE — Progress Notes (Signed)
Hematology/Oncology Consult note [facility]  [phone] [phone]  Patient Care Team: Rusty Aus, MD as PCP - General (Internal Medicine)   Name of the patient: Sarah [name]  [id number]  [birth year]   Date of visit: 08/22/22  Diagnosis-stage II colon cancer  Chief complaint/ Reason for visit-posthospital discharge follow-up  Heme/Onc history: patient is [name] with a past medical history significant for paroxysmal A-fib, non-ST segment elevation MI who was diagnosed with diverticulitis back in July 2023 which was tried to be managed medically.  She had IR guided drain placement as well as IV antibiotics in July her symptoms did not get better patient was ultimately taken for surgery for sigmoid resection on 07/19/2022.  Pathology showed invasive colorectal adenocarcinoma which was found in the area of perforated diverticulitis with pericolonic abscess.  This was 7 cm extending into the muscularis propria.  Tumor perforation not identified.  Lymphovascular invasion and perineural invasion not identified.  Margins negative.  9 lymph nodes negative for malignancy.  At least a PT3N0.  There was evidence of adenocarcinoma focally present in the pericolonic abscess and T4 was not excluded.  Postoperative course complicated by intra-abdominal abscesses requiring drains.  She also has a colostomy in place  Interval history-patient is currently at the rehab and feels fatigued.  ECOG PS- 3 Pain scale- 0   Review of systems- Review of Systems  Constitutional:  Positive for malaise/fatigue. Negative for chills, fever and weight loss.  HENT:  Negative for congestion, ear discharge and nosebleeds.   Eyes:  Negative for blurred vision.  Respiratory:  Negative for cough, hemoptysis, sputum production, shortness of breath and wheezing.   Cardiovascular:  Negative for chest pain, palpitations, orthopnea and claudication.  Gastrointestinal:   Negative for abdominal pain, blood in stool, constipation, diarrhea, heartburn, melena, nausea and vomiting.  Genitourinary:  Negative for dysuria, flank pain, frequency, hematuria and urgency.  Musculoskeletal:  Negative for back pain, joint pain and myalgias.  Skin:  Negative for rash.  Neurological:  Negative for dizziness, tingling, focal weakness, seizures, weakness and headaches.  Endo/Heme/Allergies:  Does not bruise/bleed easily.  Psychiatric/Behavioral:  Negative for depression and suicidal ideas. The patient does not have insomnia.       Allergies  Allergen Reactions   Penicillins Anaphylaxis    TOLERATED CEFOTETAN 06/2022   Sulfa Antibiotics Rash     Past Medical History:  Diagnosis Date   Arthritis    hands   Cancer ([place])    thyroid- radiation   Diabetes mellitus without complication ([place])    Hyperlipidemia    Hypertension    Hypothyroidism    IBS (irritable bowel syndrome)      Past Surgical History:  Procedure Laterality Date   ABDOMINAL HYSTERECTOMY     CATARACT EXTRACTION W/PHACO Left 12/21/2016   Procedure: CATARACT EXTRACTION PHACO AND INTRAOCULAR LENS PLACEMENT ([place])  left eye;  Surgeon: Leandrew Koyanagi, MD;  Location: [place];  Service: Ophthalmology;  Laterality: Left;  Diabetic - oral meds   CATARACT EXTRACTION W/PHACO Right 01/25/2017   Procedure: CATARACT EXTRACTION PHACO AND INTRAOCULAR LENS PLACEMENT ([place])  right diabetic;  Surgeon: Leandrew Koyanagi, MD;  Location: [place];  Service: Ophthalmology;  Laterality: Right;  diabetic - oral meds   CHOLECYSTECTOMY     COLONOSCOPY WITH PROPOFOL N/A 10/26/2016   Procedure: COLONOSCOPY WITH PROPOFOL;  Surgeon: Manya Silvas, MD;  Location: [facility] END[facility]COPY;  Service: Endoscopy;  Laterality: N/A;   IR RADIOLOGIST  EVAL & MGMT  06/14/2022   KNEE SURGERY Right    LEFT HEART CATH AND CORONARY ANGIOGRAPHY N/A 07/29/2022   Procedure: LEFT HEART CATH AND CORONARY ANGIOGRAPHY;   Surgeon: Yolonda Kida, MD;  Location: [facility];  Service: Cardiovascular;  Laterality: N/A;    Social History   Socioeconomic History   Marital status: Married    Spouse name: Not on file   Number of children: Not on file   Years of education: Not on file   Highest education level: Not on file  Occupational History   Not on file  Tobacco Use   Smoking status: Never   Smokeless tobacco: Never   Tobacco comments:    social as teenager  Vaping Use   Vaping Use: Never used  Substance and Sexual Activity   Alcohol use: No   Drug use: No   Sexual activity: Not on file  Other Topics Concern   Not on file  Social History Narrative   Not on file   Social Determinants of Health   Financial Resource Strain: Not on file  Food Insecurity: No Food Insecurity (08/16/2022)   Hunger Vital Sign    Worried About Running Out of Food in the Last Year: Never true    Ran Out of Food in the Last Year: Never true  Transportation Needs: No Transportation Needs (08/16/2022)   PRAPARE - Hydrologist (Medical): No    Lack of Transportation (Non-Medical): No  Physical Activity: Not on file  Stress: Not on file  Social Connections: Not on file  Intimate Partner Violence: Not At Risk (08/16/2022)   Humiliation, Afraid, Rape, and Kick questionnaire    Fear of Current or Ex-Partner: No    Emotionally Abused: No    Physically Abused: No    Sexually Abused: No    Family History  Problem Relation Age of Onset   Breast cancer Neg Hx      Current Outpatient Medications:    acetaminophen (TYLENOL) 325 MG tablet, Take 2 tablets (650 mg total) by mouth every 4 (four) hours as needed for headache or mild pain., Disp: , Rfl:    amiodarone (PACERONE) 200 MG tablet, Take 1 tablet (200 mg total) by mouth daily., Disp: 30 tablet, Rfl: 1   apixaban (ELIQUIS) 2.5 MG TABS tablet, Take 1 tablet (2.5 mg total) by mouth 2 (two) times daily., Disp: 60 tablet, Rfl: 2    ascorbic acid (VITAMIN C) 500 MG tablet, Take 1 tablet (500 mg total) by mouth 2 (two) times daily., Disp: 60 tablet, Rfl: 1   aspirin 81 MG chewable tablet, Chew 1 tablet (81 mg total) by mouth daily., Disp: 30 tablet, Rfl: 1   atorvastatin (LIPITOR) 40 MG tablet, Take 1 tablet (40 mg total) by mouth at bedtime., Disp: 30 tablet, Rfl: 1   ciprofloxacin (CIPRO) 500 MG tablet, Take 1 tablet (500 mg total) by mouth 2 (two) times daily for 7 days., Disp: 14 tablet, Rfl: 0   cyanocobalamin 1000 MCG tablet, Take 1,000 mcg by mouth daily., Disp: , Rfl:    feeding supplement (ENSURE ENLIVE / ENSURE PLUS) LIQD, Take 237 mLs by mouth 3 (three) times daily between meals., Disp: 237 mL, Rfl: 12   fluconazole (DIFLUCAN) 100 MG tablet, Take 1 tablet (100 mg total) by mouth daily for 7 days., Disp: 7 tablet, Rfl: 0   furosemide (LASIX) 40 MG tablet, Take 40 mg by mouth daily., Disp: , Rfl:  glimepiride (AMARYL) 4 MG tablet, Take 4 mg by mouth daily with breakfast., Disp: , Rfl:    ipratropium (ATROVENT) 0.02 % nebulizer solution, Take 2.5 mLs (0.5 mg total) by nebulization 2 (two) times daily., Disp: 75 mL, Rfl: 12   ipratropium-albuterol (DUONEB) 0.5-2.5 (3) MG/3ML SOLN, Take 3 mLs by nebulization every 6 (six) hours as needed., Disp: 360 mL, Rfl:    leptospermum manuka honey (MEDIHONEY) PSTE paste, Apply 1 Application topically daily. Apply to sacral wound, top with saline moistened gauze dressing, dry dressing and secure with silicone bordered foam. Apply thin layer (3 mm) to wound., Disp: 44 mL, Rfl: 3   levalbuterol (XOPENEX) 0.63 MG/3ML nebulizer solution, Take 3 mLs (0.63 mg total) by nebulization 2 (two) times daily., Disp: 3 mL, Rfl: 12   levothyroxine (SYNTHROID) 125 MCG tablet, Take 1 tablet (125 mcg total) by mouth daily before breakfast., Disp: 30 tablet, Rfl: 1   lisinopril (ZESTRIL) 2.5 MG tablet, Take 1 tablet (2.5 mg total) by mouth daily., Disp: 30 tablet, Rfl: 1   magnesium oxide (MAG-OX) 400  (240 Mg) MG tablet, Take 400 mg by mouth daily., Disp: , Rfl:    metoprolol tartrate (LOPRESSOR) 25 MG tablet, Take 1 tablet (25 mg total) by mouth 3 (three) times daily., Disp: 90 tablet, Rfl: 1   metroNIDAZOLE (FLAGYL) 500 MG tablet, Take 1 tablet (500 mg total) by mouth 3 (three) times daily for 7 days., Disp: 21 tablet, Rfl: 0   Multiple Vitamin (MULTIVITAMIN WITH MINERALS) TABS tablet, Take 1 tablet by mouth daily., Disp: 30 tablet, Rfl: 1   ondansetron (ZOFRAN-ODT) 4 MG disintegrating tablet, Take 1 tablet (4 mg total) by mouth every 8 (eight) hours as needed for nausea or vomiting., Disp: 12 tablet, Rfl: 0   potassium chloride SA (KLOR-CON M) 20 MEQ tablet, Take 20 mEq by mouth 2 (two) times daily., Disp: , Rfl:    sertraline (ZOLOFT) 100 MG tablet, Take 100 mg by mouth daily., Disp: , Rfl:    sodium chloride flush (NS) 0.9 % SOLN, 5 mLs by Intracatheter route every 8 (eight) hours., Disp: 1350 mL, Rfl: 0   spironolactone (ALDACTONE) 25 MG tablet, Take 0.5 tablets (12.5 mg total) by mouth daily., Disp: 30 tablet, Rfl: 1   venlafaxine (EFFEXOR) 37.5 MG tablet, Take 37.5 mg by mouth daily., Disp: , Rfl:    zinc sulfate 220 (50 Zn) MG capsule, Take 1 capsule (220 mg total) by mouth daily., Disp: 30 capsule, Rfl: 1  Physical exam:  Vitals:   08/22/22 0841  BP: 132/88  Pulse: 98  Resp: 18  Temp: (!) 94 F (34.4 C)  TempSrc: Tympanic  SpO2: 100%   Physical Exam Constitutional:      Comments: She is elderly and frail.  Sitting in a wheelchair.  Appears in no acute distress  Cardiovascular:     Rate and Rhythm: Normal rate and regular rhythm.     Heart sounds: Normal heart sounds.  Pulmonary:     Effort: Pulmonary effort is normal.     Breath sounds: Normal breath sounds.  Abdominal:     General: Bowel sounds are normal.     Palpations: Abdomen is soft.     Comments: Colostomy in place.  2 intra-abdominal drains in place.   Skin:    General: Skin is warm and dry.  Neurological:      Mental Status: She is alert and oriented to person, place, and time.         Latest  Ref Rng & Units 08/15/2022    6:00 AM  CMP  Glucose 70 - 99 mg/dL 268   BUN 8 - 23 mg/dL 19   Creatinine 0.44 - 1.00 mg/dL 0.65   Sodium 135 - 145 mmol/L 133   Potassium 3.5 - 5.1 mmol/L 3.7   Chloride 98 - 111 mmol/L 97   CO2 22 - 32 mmol/L 29   Calcium 8.9 - 10.3 mg/dL 7.5       Latest Ref Rng & Units 08/15/2022    6:00 AM  CBC  WBC 4.0 - 10.5 K/uL 4.0   Hemoglobin 12.0 - 15.0 g/dL 9.1   Hematocrit 36.0 - 46.0 % 29.2   Platelets 150 - 400 K/uL 163     No images are attached to the encounter.  CT CHEST ABDOMEN PELVIS W CONTRAST  Result Date: 08/16/2022 CLINICAL DATA:  Colon cancer, staging EXAM: CT CHEST, ABDOMEN, AND PELVIS WITH CONTRAST TECHNIQUE: Multidetector CT imaging of the chest, abdomen and pelvis was performed following the standard protocol during bolus administration of intravenous contrast. RADIATION DOSE REDUCTION: This exam was performed according to the departmental dose-optimization program which includes automated exposure control, adjustment of the mA and/or kV according to patient size and/or use of iterative reconstruction technique. CONTRAST:  164m OMNIPAQUE IOHEXOL 300 MG/ML  SOLN COMPARISON:  08/13/2022, 08/11/2022 FINDINGS: CT CHEST FINDINGS Cardiovascular: The heart is enlarged without pericardial effusion. No evidence of thoracic aortic aneurysm or dissection. Diffuse atherosclerosis of the aorta and coronary vasculature. Mediastinum/Nodes: No enlarged mediastinal, hilar, or axillary lymph nodes. Thyroid gland, trachea, and esophagus demonstrate no significant findings. Lungs/Pleura: There are small bilateral pleural effusions and dependent lower lobe atelectasis, increased since prior exam. Areas of nodular airspace disease are seen throughout the mid and upper lung zones, likely inflammatory or infectious. Close follow-up is warranted to assess resolution in this  patient with a history of colon cancer. No pneumothorax. The central airways are patent. Musculoskeletal: There are no acute or destructive bony lesions. Reconstructed images demonstrate no additional findings. CT ABDOMEN PELVIS FINDINGS Hepatobiliary: No focal liver abnormality is seen. Status post cholecystectomy. No biliary dilatation. Pancreas: Unremarkable. No pancreatic ductal dilatation or surrounding inflammatory changes. Spleen: Normal in size without focal abnormality. Adrenals/Urinary Tract: Stable 4 mm nonobstructing right renal calculus. Otherwise the kidneys are unremarkable. No obstructive uropathy. Lack of excretion of contrast on delayed imaging with persistent bilateral nephrogram could reflect underlying acute renal insufficiency. Please correlate with laboratory analysis. Bladder is partially decompressed with an indwelling Foley catheter. The adrenals are unremarkable. Stomach/Bowel: Left lower quadrant ostomy again identified, with no evidence of bowel obstruction or ileus. Stable mural thickening of the colon at the level of the ostomy. Postsurgical changes are seen from sigmoid colon resection. Remaining portions of the distal sigmoid colon and rectum are moderately distended without focal abnormality. Vascular/Lymphatic: Aortic atherosclerosis. No enlarged abdominal or pelvic lymph nodes. Reproductive: Status post hysterectomy. No adnexal masses. Other: There are 2 percutaneous pigtail drainage catheters. The more superior catheter is seen within the left paracolic gutter, with complete resolution of the fluid collection seen previously. The second catheter is placed in the region of a multilocular collection in the pelvis. The largest portion of this multilocular collection is decompressed by the pigtail drainage catheter. However, numerous other smaller collections persist. These collections may be interconnected, and continued follow-up is recommended. Measurements are as follows:: Image  87/2, 5.2 x 3.5 cm. Image 94/2, 4.8 x 4.1 cm. Image 96/2, 4.4 x 1.6 cm. Postsurgical  changes are seen from midline laparotomy, with healing anterior abdominal wound. Musculoskeletal: No acute or destructive bony lesions. Reconstructed images demonstrate no additional findings. IMPRESSION: 1. No evidence of metastatic disease. 2. Patchy bilateral nodular airspace disease most consistent with multifocal pneumonia or inflammation. Continued follow-up is recommended to ensure resolution and exclude metastatic disease. 3. Increased bilateral pleural effusions, with dependent lower lobe atelectasis. 4. Pigtail drainage catheters as above. The multilocular pelvic collection seen on prior study is partially decompressed by the drainage catheter, with remaining likely interconnected fluid collections as above. Continued follow-up is recommended. Complete resolution of the left paracolic collection after pigtail catheter placement. 5. Continued mural thickening of the colon at the ostomy site left lower quadrant, which may reflect postsurgical edema. No bowel obstruction or ileus. 6. Stable nonobstructing right renal calculus. 7.  Aortic Atherosclerosis (ICD10-I70.0). Electronically Signed   By: Randa Ngo M.D.   On: 08/16/2022 19:44   CT GUIDED PERITONEAL/RETROPERITONEAL FLUID DRAIN BY PERC CATH  Result Date: 08/13/2022 CLINICAL DATA:  Status post Hartmann's procedure for diverticulitis of the sigmoid colon on 07/19/2022. Development postoperative abscesses in the left lateral abdomen adjacent to the descending colon and pelvis to the left of midline. EXAM: 1. CT GUIDED PERCUTANEOUS CATHETER DRAINAGE OF LEFT ABDOMINAL PERITONEAL ABSCESS 2. CT-GUIDED PERCUTANEOUS CATHETER DRAINAGE OF PELVIC PERITONEAL ABSCESS ANESTHESIA/SEDATION: Moderate (conscious) sedation was employed during this procedure. A total of Versed 1.0 mg and Fentanyl 50 mcg was administered intravenously by radiology nursing. Moderate Sedation Time: 45  minutes. The patient's level of consciousness and vital signs were monitored continuously by radiology nursing throughout the procedure under my direct supervision. PROCEDURE: The procedure, risks, benefits, and alternatives were explained to the patient. Questions regarding the procedure were encouraged and answered. The patient understands and consents to the procedure. A time out was performed prior to initiating the procedure. The operative field was prepped with chlorhexidine in a sterile fashion, and a sterile drape was applied covering the operative field. A sterile gown and sterile gloves were used for the procedure. Local anesthesia was provided with 1% Lidocaine. CT was performed through the abdomen and pelvis in a supine position. Separate sites were marked along the left lateral abdominal wall and anterior lower left abdominal wall at the level of the pelvis. A 18 gauge trocar needle was advanced to the level of a left-sided peritoneal abscess located adjacent to the descending colon. After aspiration of fluid from the needle, a guidewire was advanced into the collection and the needle removed. The percutaneous tract was dilated over the wire and a 10 French percutaneous drain advanced over the wire. Catheter positioning was confirmed by CT. Additional fluid sample was aspirated from the drain and sent for culture analysis. The drainage catheter was flushed with sterile saline and connected to a suction bulb. A new 18 gauge trocar needle was then advanced from an anterior approach to the left of midline to the level of a left pelvic peritoneal abscess. After confirming needle tip position, a guidewire was advanced and the needle removed. The percutaneous tract was dilated over the wire and a 10 French percutaneous drainage catheter advanced. A fluid sample was withdrawn from the drain and sent for culture analysis. The drain was flushed with sterile saline and connected to a suction bulb. Both drainage  catheters were secured at the skin with Prolene retention sutures and adhesive retention devices. Dressings were applied at the catheter exit sites. RADIATION DOSE REDUCTION: This exam was performed according to the departmental dose-optimization  program which includes automated exposure control, adjustment of the mA and/or kV according to patient size and/or use of iterative reconstruction technique. COMPLICATIONS: None FINDINGS: Aspiration at the level of the left lateral pericolic abscess yielded grossly purulent fluid. After placement of a drainage catheter within the collection, there is good output of purulent fluid. After drain placement into the irregular cavity containing air and fluid in the left pelvis, there was return initially air followed by bloody fluid and debris. After placement of a drainage catheter within this collection there is output of bloody fluid. IMPRESSION: 1. CT-guided percutaneous catheter drainage of left lateral abdominal abscess yielding purulent fluid. A fluid sample was sent for culture analysis. A 10 French percutaneous drainage catheter was placed and attached to suction bulb drainage. 2. CT-guided percutaneous catheter drainage of pelvic abscess yielding bloody fluid and debris. A fluid sample was sent for culture analysis. A 10 French percutaneous drainage catheter was placed and attached to suction bulb drainage. Electronically Signed   By: Aletta Edouard M.D.   On: 08/13/2022 14:19   CT GUIDED PERITONEAL/RETROPERITONEAL FLUID DRAIN BY PERC CATH  Result Date: 08/13/2022 CLINICAL DATA:  Status post Hartmann's procedure for diverticulitis of the sigmoid colon on 07/19/2022. Development postoperative abscesses in the left lateral abdomen adjacent to the descending colon and pelvis to the left of midline. EXAM: 1. CT GUIDED PERCUTANEOUS CATHETER DRAINAGE OF LEFT ABDOMINAL PERITONEAL ABSCESS 2. CT-GUIDED PERCUTANEOUS CATHETER DRAINAGE OF PELVIC PERITONEAL ABSCESS  ANESTHESIA/SEDATION: Moderate (conscious) sedation was employed during this procedure. A total of Versed 1.0 mg and Fentanyl 50 mcg was administered intravenously by radiology nursing. Moderate Sedation Time: 45 minutes. The patient's level of consciousness and vital signs were monitored continuously by radiology nursing throughout the procedure under my direct supervision. PROCEDURE: The procedure, risks, benefits, and alternatives were explained to the patient. Questions regarding the procedure were encouraged and answered. The patient understands and consents to the procedure. A time out was performed prior to initiating the procedure. The operative field was prepped with chlorhexidine in a sterile fashion, and a sterile drape was applied covering the operative field. A sterile gown and sterile gloves were used for the procedure. Local anesthesia was provided with 1% Lidocaine. CT was performed through the abdomen and pelvis in a supine position. Separate sites were marked along the left lateral abdominal wall and anterior lower left abdominal wall at the level of the pelvis. A 18 gauge trocar needle was advanced to the level of a left-sided peritoneal abscess located adjacent to the descending colon. After aspiration of fluid from the needle, a guidewire was advanced into the collection and the needle removed. The percutaneous tract was dilated over the wire and a 10 French percutaneous drain advanced over the wire. Catheter positioning was confirmed by CT. Additional fluid sample was aspirated from the drain and sent for culture analysis. The drainage catheter was flushed with sterile saline and connected to a suction bulb. A new 18 gauge trocar needle was then advanced from an anterior approach to the left of midline to the level of a left pelvic peritoneal abscess. After confirming needle tip position, a guidewire was advanced and the needle removed. The percutaneous tract was dilated over the wire and a 10  French percutaneous drainage catheter advanced. A fluid sample was withdrawn from the drain and sent for culture analysis. The drain was flushed with sterile saline and connected to a suction bulb. Both drainage catheters were secured at the skin with Prolene retention sutures and  adhesive retention devices. Dressings were applied at the catheter exit sites. RADIATION DOSE REDUCTION: This exam was performed according to the departmental dose-optimization program which includes automated exposure control, adjustment of the mA and/or kV according to patient size and/or use of iterative reconstruction technique. COMPLICATIONS: None FINDINGS: Aspiration at the level of the left lateral pericolic abscess yielded grossly purulent fluid. After placement of a drainage catheter within the collection, there is good output of purulent fluid. After drain placement into the irregular cavity containing air and fluid in the left pelvis, there was return initially air followed by bloody fluid and debris. After placement of a drainage catheter within this collection there is output of bloody fluid. IMPRESSION: 1. CT-guided percutaneous catheter drainage of left lateral abdominal abscess yielding purulent fluid. A fluid sample was sent for culture analysis. A 10 French percutaneous drainage catheter was placed and attached to suction bulb drainage. 2. CT-guided percutaneous catheter drainage of pelvic abscess yielding bloody fluid and debris. A fluid sample was sent for culture analysis. A 10 French percutaneous drainage catheter was placed and attached to suction bulb drainage. Electronically Signed   By: Aletta Edouard M.D.   On: 08/13/2022 14:19   CT HEAD WO CONTRAST (5MM)  Result Date: 08/12/2022 CLINICAL DATA:  Altered mental status EXAM: CT HEAD WITHOUT CONTRAST TECHNIQUE: Contiguous axial images were obtained from the base of the skull through the vertex without intravenous contrast. RADIATION DOSE REDUCTION: This exam  was performed according to the departmental dose-optimization program which includes automated exposure control, adjustment of the mA and/or kV according to patient size and/or use of iterative reconstruction technique. COMPARISON:  09/16/2017 FINDINGS: Brain: No acute intracranial findings are seen. There are no signs of bleeding within the cranium. Cortical sulci are prominent. Ventricles are not dilated. There is subtle decreased density in periventricular white matter and basal ganglia. Vascular: There are scattered arterial calcifications. Skull: Unremarkable. Sinuses/Orbits: Defects in the medial walls of both maxillary sinuses may be postsurgical. Other: None. IMPRESSION: No acute intracranial findings are seen.  Atrophy. Electronically Signed   By: Elmer Picker M.D.   On: 08/12/2022 17:57   CT ANGIO GI BLEED  Result Date: 08/11/2022 CLINICAL DATA:  Lower GI bleeding. Bright red blood per rectum. Colostomy. EXAM: CTA ABDOMEN AND PELVIS WITHOUT AND WITH CONTRAST TECHNIQUE: Multidetector CT imaging of the abdomen and pelvis was performed using the standard protocol during bolus administration of intravenous contrast. Multiplanar reconstructed images and MIPs were obtained and reviewed to evaluate the vascular anatomy. RADIATION DOSE REDUCTION: This exam was performed according to the departmental dose-optimization program which includes automated exposure control, adjustment of the mA and/or kV according to patient size and/or use of iterative reconstruction technique. CONTRAST:  43m OMNIPAQUE IOHEXOL 350 MG/ML SOLN COMPARISON:  CT abdomen and pelvis 06/14/2022 FINDINGS: VASCULAR Aorta: Normal caliber aorta without aneurysm, dissection, vasculitis or significant stenosis. Moderate calcified atherosclerotic disease seen throughout the aorta. Celiac: Patent without evidence of aneurysm, dissection, vasculitis or significant stenosis. SMA: Patent without evidence of aneurysm, dissection, vasculitis or  significant stenosis. Renals: Both renal arteries are patent without evidence of aneurysm, dissection, vasculitis, fibromuscular dysplasia or significant stenosis. Accessory left renal artery present. 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 Artery is otherwise widely patent. Inflow: Patent without evidence of aneurysm, dissection, vasculitis or significant stenosis. Proximal Outflow: Bilateral common femoral and visualized portions of the superficial and profunda femoral arteries are patent without evidence of aneurysm, dissection,  vasculitis or significant stenosis. Veins: No obvious venous abnormality within the limitations of this arterial phase study. Review of the MIP images confirms the above findings. NON-VASCULAR Lower chest: There are small bilateral pleural effusions with compressive atelectasis in the lung bases, left greater than right. Hepatobiliary: No focal liver abnormality is seen. Status post cholecystectomy. No biliary dilatation. Pancreas: Unremarkable. No pancreatic ductal dilatation or surrounding inflammatory changes. Spleen: Normal in size without focal abnormality. Adrenals/Urinary Tract: The bladder is markedly distended. There is mild bilateral hydronephrosis without obstructing calculi identified. No perinephric stranding or fluid collection. Single 4 mm nonobstructing right renal calculus. Adrenal glands are within normal limits. Stomach/Bowel: There is a new left lower quadrant colostomy. The colon proximal to the colostomy demonstrates mild wall thickening (descending colon) without pneumatosis. Small bowel and stomach are within normal limits. The distal sigmoid colon and rectum are fluid distended, but otherwise unremarkable. There are several air-fluid collections in the pelvis which may or may not be bowel loops. Evaluation is limited secondary to lack of oral contrast in this region. The largest air-fluid  collection is in the left lower quadrant measuring 6.8 x 5.6 by 5.2 cm (image 10/172). This region is suspicious for abscess and abuts the sigmoid colon staple line image 7/176. There is a new enhancing air-fluid collection in the left paracolic gutter worrisome for abscess measuring 3.3 by 3.7 by 10.8 cm image 10/104. No active gastrointestinal bleeding identified. Lymphatic: No enlarged lymph nodes are seen. Reproductive: Uterus is not visualized. Other: There is no ascites. There is new diffuse body wall edema. Small fat containing umbilical hernia is again seen. There is a new midline abdominal wall wound without discrete focal fluid collection. There is a new left-sided ostomy which appears within normal limits. No free intraperitoneal air. Musculoskeletal: Degenerative changes affect the spine. IMPRESSION: VASCULAR 1. No active gastrointestinal bleeding identified. 2. No evidence for acute arterial abnormality. 3.  Aortic Atherosclerosis (ICD10-I70.0). NON-VASCULAR 1. New left lower quadrant colostomy present. No evidence for bowel obstruction. Mild wall thickening of the descending colon proximal to the ostomy may represent nonspecific colitis. 2. New air-fluid collection in the left paracolic gutter worrisome for abscess measuring 3.3 x 3.7 x 10.8 cm. 3. Findings suspicious for air-fluid collection abutting the sigmoid colon staple line measuring 6.8 x 5.6 x 5.2 cm. Findings may represent bowel leak and/or abscess. 4. Mild bilateral hydronephrosis likely secondary to markedly distended bladder. Recommend clinical correlation and follow-up. 5. Diffuse body wall edema. 6. Nonobstructing right renal calculus. 7. Small pleural effusions. Electronically Signed   By: Ronney Asters M.D.   On: 08/11/2022 16:20   CARDIAC CATHETERIZATION  Result Date: 08/04/2022   Mid Cx-1 lesion is 100% stenosed.   Mid LAD lesion is 75% stenosed.   Prox RCA to Dist RCA lesion is 25% stenosed with 25% stenosed side branch in RPDA.    Dist LAD lesion is 50% stenosed.   Ost LM lesion is 25% stenosed.   Mid Cx-2 lesion is 100% stenosed.   There is moderate to severe left ventricular systolic dysfunction.   LV end diastolic pressure is moderately elevated.   The left ventricular ejection fraction is 25-35% by visual estimate. Conclusion Inpatient cardiac cath secondary to non-STEMI Left ventriculogram Moderate to severely dilated left ventricular chamber Moderate to severely left ventricular depressed function between 25 to 30% Overly depressed Coronaries Left main mild disease 25% ostial nonobstructive moderate calcification LAD large diffuse minor irregularities with a 75% mid lesion 50% distal TIMI-3 flow  Circumflex large 100% mid occlusion IRA TIMI 0 flow RCA moderate to large diffuse minor irregularities Right dominant system Faint collaterals right to left to circumflex Intervention deferred Recommend heart failure therapy cardiomyopathy therapy Occluded mid circumflex does not appear to be particularly amenable to CTO intervention, but will consider at a later time once patient is medically stable and no further GI bleeding. Recommend only aspirin for now consider Plavix for 1 to 3 months but there is some concerned about increasing the risk of GI bleeding because patient is on Eliquis for atrial fibrillation Recommend cardiomyopathy heart failure management Entresto ARB or ACE low-dose beta-blocker spironolactone diuretics as tolerated Follow-up with cardiology as an outpatient for further management  ECHOCARDIOGRAM LIMITED  Result Date: 07/27/2022    ECHOCARDIOGRAM LIMITED REPORT   Patient Name:   BULAH LURIE Tobia Date of Exam: 07/26/2022 Medical Rec #:  [id number]   Height:       63.0 in Accession #:    [id number]  Weight:       155.0 lb Date of Birth:  1946-02-05   BSA:          1.735 m Patient Age:    41 years    BP:           123/77 mmHg Patient Gender: F           HR:           98 bpm. Exam Location:  [facility] Procedure: Limited Echo,  Cardiac Doppler and Color Doppler Indications:     NSTEMI I21.4  History:         Patient has prior history of Echocardiogram examinations, most                  recent 06/19/2022. Risk Factors:Hypertension and Diabetes.                  Cancer.  Sonographer:     Sherrie Sport Referring Phys:  [id number] [place] TANG Diagnosing Phys: Yolonda Kida MD IMPRESSIONS  1. Left ventricular ejection fraction, by estimation, is 25 to 30%. The left ventricle has severely decreased function. The left ventricle demonstrates global hypokinesis. The left ventricular internal cavity size was mildly dilated. Left ventricular diastolic function could not be evaluated.  2. The right ventricular size is mildly enlarged.  3. Left atrial size was mildly dilated.  4. Right atrial size was mildly dilated.  5. The mitral valve is normal in structure. FINDINGS  Left Ventricle: Left ventricular ejection fraction, by estimation, is 25 to 30%. The left ventricle has severely decreased function. The left ventricle demonstrates global hypokinesis. The left ventricular internal cavity size was mildly dilated. There is no left ventricular hypertrophy. Left ventricular diastolic function could not be evaluated. Right Ventricle: The right ventricular size is mildly enlarged. No increase in right ventricular wall thickness. Left Atrium: Left atrial size was mildly dilated. Right Atrium: Right atrial size was mildly dilated. Pericardium: There is no evidence of pericardial effusion. Mitral Valve: The mitral valve is normal in structure. Tricuspid Valve: The tricuspid valve is normal in structure. Tricuspid valve regurgitation is mild. Pulmonic Valve: The pulmonic valve was normal in structure. Pulmonic valve regurgitation is trivial. Aorta: The ascending aorta was not well visualized. LEFT VENTRICLE PLAX 2D LVIDd:         5.20 cm LVIDs:         4.80 cm LV PW:         1.00 cm LV IVS:  1.10 cm  LV Volumes (MOD) LV vol d, MOD A2C: 108.0 ml LV  vol d, MOD A4C: 126.0 ml LV vol s, MOD A2C: 83.3 ml LV vol s, MOD A4C: 97.7 ml LV SV MOD A2C:     24.7 ml LV SV MOD A4C:     126.0 ml LV SV MOD BP:      25.7 ml LEFT ATRIUM         Index LA diam:    4.70 cm 2.71 cm/m   AORTA Ao Root diam: 2.90 cm TRICUSPID VALVE TR Peak grad:   30.9 mmHg TR Vmax:        278.00 cm/s Yolonda Kida MD Electronically signed by Yolonda Kida MD Signature Date/Time: 07/27/2022/7:41:23 AM    Final    DG Chest Port 1 View  Result Date: 07/26/2022 CLINICAL DATA:  Acute respiratory failure and hypoxia. EXAM: PORTABLE CHEST 1 VIEW COMPARISON:  07/25/2022 FINDINGS: There is a right arm PICC line with tip in the right atrium. Stable cardiomediastinal contours. Bilateral multifocal airspace opacities are unchanged in the interval. No new findings. IMPRESSION: No change in aeration to the lungs compared with previous exam. Electronically Signed   By: Kerby Moors M.D.   On: 07/26/2022 11:05   DG Chest Port 1 View  Result Date: 07/25/2022 CLINICAL DATA:  Acute respiratory failure EXAM: PORTABLE CHEST 1 VIEW COMPARISON:  07/24/2022 FINDINGS: Cardiac shadow is enlarged but stable. Right PICC is seen deep in the right atrium but stable. Patchy airspace opacities are again identified bilaterally but mildly improved when compared with the prior study. No bony abnormality is noted. IMPRESSION: Slight improved aeration when compared with the previous exam. Electronically Signed   By: Inez Catalina M.D.   On: 07/25/2022 04:10   DG Chest Port 1 View  Result Date: 07/24/2022 CLINICAL DATA:  Acute respiratory failure EXAM: PORTABLE CHEST 1 VIEW COMPARISON:  July 23, 2022 FINDINGS: A right PICC line terminates near the caval atrial junction. The cardiomediastinal silhouette is stable. Bilateral pulmonary opacities, particularly in the perihilar regions, have worsened. Probable skin fold over the lateral right lung with lung markings on both sides. No convincing evidence of pneumothorax.  Decreased lung volumes. No other acute abnormalities. IMPRESSION: 1. The linear stripe over the lateral right mid chest is likely a skin fold as there are lung markings on both sides. No convincing evidence of pneumothorax. Recommend attention on follow-up. 2. Stable right PICC line. 3. Increasing bilateral perihilar opacities. While pulmonary edema is possible, the findings could represent a multifocal infectious process/pneumonia. Recommend clinical correlation and attention on follow-up. Electronically Signed   By: Dorise Bullion III M.D.   On: 07/24/2022 07:52     Assessment and plan- Patient is a 76 y.o. female with newly diagnosed stage II colon cancer here for a posthospital discharge follow-up  Again explained to the patient that she has T3 N0 disease based on pathology although T4 disease could not be ruled out any less than 12 lymph nodes were sampled.  She does not require any adjuvant chemotherapy given her stage II disease poor baseline performance status and ongoing intra-abdominal abscesses requiring drains.  From a colon cancer standpoint I will see her back in 3 months with labs  CT chest abdomen with contrast that was done in the hospital did not show any evidence of distant metastatic disease.  There was concern for pneumonia noted on CT scan and I will consider following up on these findings with a  repeat scan in about 4 months   Visit Diagnosis 1. Malignant neoplasm of sigmoid colon ([place])   2. Goals of care, counseling/discussion      Dr. Randa Evens, MD, MPH [facility] [phone] 08/22/2022 9:09 AM

## 2022-08-24 ENCOUNTER — Ambulatory Visit: Payer: Self-pay | Admitting: Urology

## 2022-08-24 LAB — BLOOD GAS, ARTERIAL
Acid-Base Excess: 11.4 mmol/L — ABNORMAL HIGH (ref 0.0–2.0)
Bicarbonate: 35.1 mmol/L — ABNORMAL HIGH (ref 20.0–28.0)
O2 Saturation: 97.9 %
Patient temperature: 37
pCO2 arterial: 41 mmHg (ref 32–48)
pH, Arterial: 7.54 — ABNORMAL HIGH (ref 7.35–7.45)
pO2, Arterial: 75 mmHg — ABNORMAL LOW (ref 83–108)

## 2022-08-25 ENCOUNTER — Ambulatory Visit (INDEPENDENT_AMBULATORY_CARE_PROVIDER_SITE_OTHER): Payer: Medicare Other | Admitting: Urology

## 2022-08-25 ENCOUNTER — Ambulatory Visit: Payer: Medicare Other | Admitting: Physician Assistant

## 2022-08-25 ENCOUNTER — Encounter: Payer: Self-pay | Admitting: Urology

## 2022-08-25 VITALS — BP 117/76 | HR 101 | Ht 63.0 in | Wt 132.0 lb

## 2022-08-25 DIAGNOSIS — R339 Retention of urine, unspecified: Secondary | ICD-10-CM

## 2022-08-25 NOTE — Progress Notes (Signed)
08/25/2022 8:19 AM   Sarah [name] 01/04/1946 [id number]  Referring provider: Rusty Aus, MD [place] [facility] [place],  [place] [address]  Chief Complaint  Patient presents with   Urinary Retention    HPI: Sarah [name] is a 76 y.o. female presents for evaluation of urinary retention.  Hospitalized 08/11/2022 for rectal bleeding and intra-abdominal abscess which were amenable to IR guided drainage Noted have urinary retention during hospitalization and she was discharged with an indwelling Foley Denies previous history of voiding problems or urinary retention   PMH: Past Medical History:  Diagnosis Date   Arthritis    hands   Cancer ([employer])    thyroid- radiation   Diabetes mellitus without complication ([place])    Hyperlipidemia    Hypertension    Hypothyroidism    IBS (irritable bowel syndrome)     Surgical History: Past Surgical History:  Procedure Laterality Date   ABDOMINAL HYSTERECTOMY     CATARACT EXTRACTION W/PHACO Left 12/21/2016   Procedure: CATARACT EXTRACTION PHACO AND INTRAOCULAR LENS PLACEMENT ([place])  left eye;  Surgeon: Leandrew Koyanagi, MD;  Location: [facility];  Service: Ophthalmology;  Laterality: Left;  Diabetic - oral meds   CATARACT EXTRACTION W/PHACO Right 01/25/2017   Procedure: CATARACT EXTRACTION PHACO AND INTRAOCULAR LENS PLACEMENT ([place])  right diabetic;  Surgeon: Leandrew Koyanagi, MD;  Location: [facility];  Service: Ophthalmology;  Laterality: Right;  diabetic - oral meds   CHOLECYSTECTOMY     COLONOSCOPY WITH PROPOFOL N/A 10/26/2016   Procedure: COLONOSCOPY WITH PROPOFOL;  Surgeon: Manya Silvas, MD;  Location: [facility] ENDOSCOPY;  Service: Endoscopy;  Laterality: N/A;   IR RADIOLOGIST EVAL & MGMT  06/14/2022   KNEE SURGERY Right    LEFT HEART CATH AND CORONARY ANGIOGRAPHY N/A 07/29/2022   Procedure: LEFT HEART CATH AND CORONARY ANGIOGRAPHY;  Surgeon: Yolonda Kida, MD;  Location:  [facility];  Service: Cardiovascular;  Laterality: N/A;    Home Medications:  Allergies as of 08/25/2022       Reactions   Penicillins Anaphylaxis   TOLERATED CEFOTETAN 06/2022   Sulfa Antibiotics Rash        Medication List        Accurate as of August 25, 2022  8:19 AM. If you have any questions, ask your nurse or doctor.          acetaminophen 325 MG tablet Commonly known as: TYLENOL Take 2 tablets (650 mg total) by mouth every 4 (four) hours as needed for headache or mild pain.   amiodarone 200 MG tablet Commonly known as: PACERONE Take 1 tablet (200 mg total) by mouth daily.   apixaban 2.5 MG Tabs tablet Commonly known as: ELIQUIS Take 1 tablet (2.5 mg total) by mouth 2 (two) times daily.   ascorbic acid 500 MG tablet Commonly known as: VITAMIN C Take 1 tablet (500 mg total) by mouth 2 (two) times daily.   aspirin 81 MG chewable tablet Chew 1 tablet (81 mg total) by mouth daily.   atorvastatin 40 MG tablet Commonly known as: LIPITOR Take 1 tablet (40 mg total) by mouth at bedtime.   cyanocobalamin 1000 MCG tablet Take 1,000 mcg by mouth daily.   feeding supplement Liqd Take 237 mLs by mouth 3 (three) times daily between meals.   furosemide 40 MG tablet Commonly known as: LASIX Take 40 mg by mouth daily.   glimepiride 4 MG tablet Commonly known as: AMARYL Take 4 mg  by mouth daily with breakfast.   ipratropium 0.02 % nebulizer solution Commonly known as: ATROVENT Take 2.5 mLs (0.5 mg total) by nebulization 2 (two) times daily.   ipratropium-albuterol 0.5-2.5 (3) MG/3ML Soln Commonly known as: DUONEB Take 3 mLs by nebulization every 6 (six) hours as needed.   leptospermum manuka honey Pste paste Apply 1 Application topically daily. Apply to sacral wound, top with saline moistened gauze dressing, dry dressing and secure with silicone bordered foam. Apply thin layer (3 mm) to wound.   levalbuterol 0.63 MG/3ML nebulizer  solution Commonly known as: XOPENEX Take 3 mLs (0.63 mg total) by nebulization 2 (two) times daily.   levothyroxine 125 MCG tablet Commonly known as: SYNTHROID Take 1 tablet (125 mcg total) by mouth daily before breakfast.   lisinopril 2.5 MG tablet Commonly known as: ZESTRIL Take 1 tablet (2.5 mg total) by mouth daily.   magnesium oxide 400 (240 Mg) MG tablet Commonly known as: MAG-OX Take 400 mg by mouth daily.   metoprolol tartrate 25 MG tablet Commonly known as: LOPRESSOR Take 1 tablet (25 mg total) by mouth 3 (three) times daily.   multivitamin with minerals Tabs tablet Take 1 tablet by mouth daily.   ondansetron 4 MG disintegrating tablet Commonly known as: ZOFRAN-ODT Take 1 tablet (4 mg total) by mouth every 8 (eight) hours as needed for nausea or vomiting.   potassium chloride SA 20 MEQ tablet Commonly known as: KLOR-CON M Take 20 mEq by mouth 2 (two) times daily.   sertraline 100 MG tablet Commonly known as: ZOLOFT Take 100 mg by mouth daily.   sodium chloride flush 0.9 % Soln Commonly known as: NS 5 mLs by Intracatheter route every 8 (eight) hours.   spironolactone 25 MG tablet Commonly known as: ALDACTONE Take 0.5 tablets (12.5 mg total) by mouth daily.   venlafaxine 37.5 MG tablet Commonly known as: EFFEXOR Take 37.5 mg by mouth daily.   zinc sulfate 220 (50 Zn) MG capsule Take 1 capsule (220 mg total) by mouth daily.        Allergies:  Allergies  Allergen Reactions   Penicillins Anaphylaxis    TOLERATED CEFOTETAN 06/2022   Sulfa Antibiotics Rash    Family History: Family History  Problem Relation Age of Onset   Breast cancer Neg Hx     Social History:  reports that she has never smoked. She has never used smokeless tobacco. She reports that she does not drink alcohol and does not use drugs.   Physical Exam: BP 117/76   Pulse (!) 101   Ht '5\' 3"'$  (1.6 m)   Wt 132 lb (59.9 kg)   BMI 23.38 kg/m   Constitutional:  Alert, No acute  distress. HEENT: [place] AT. Respiratory: Normal respiratory effort, no increased work of breathing. GI: Abdomen is soft, nontender, nondistended, no abdominal masses GU: No CVA tenderness Skin: No rashes, bruises or suspicious lesions. Neurologic: Grossly intact, no focal deficits, moving all 4 extremities.   Assessment & Plan:    1.  Urinary retention Catheter removed for voiding trial Follow-up scheduled this afternoon for PVR  Abbie Sons, MD  [facility] [address], [place], [place] [address] [phone]

## 2022-08-26 ENCOUNTER — Ambulatory Visit: Payer: Self-pay | Admitting: Urology

## 2022-08-30 ENCOUNTER — Inpatient Hospital Stay
Admission: EM | Admit: 2022-08-30 | Discharge: 2022-09-13 | DRG: 862 | Disposition: A | Discharge status: Skilled Nursing Facility | Payer: Medicare Other | Attending: Internal Medicine | Admitting: Internal Medicine

## 2022-08-30 ENCOUNTER — Emergency Department: Payer: Medicare Other

## 2022-08-30 DIAGNOSIS — Z882 Allergy status to sulfonamides status: Secondary | ICD-10-CM | POA: Diagnosis not present

## 2022-08-30 DIAGNOSIS — T8143XA Infection following a procedure, organ and space surgical site, initial encounter: Secondary | ICD-10-CM | POA: Diagnosis present

## 2022-08-30 DIAGNOSIS — B179 Acute viral hepatitis, unspecified: Secondary | ICD-10-CM

## 2022-08-30 DIAGNOSIS — R7989 Other specified abnormal findings of blood chemistry: Secondary | ICD-10-CM | POA: Diagnosis not present

## 2022-08-30 DIAGNOSIS — Z79890 Hormone replacement therapy (postmenopausal): Secondary | ICD-10-CM

## 2022-08-30 DIAGNOSIS — Z85850 Personal history of malignant neoplasm of thyroid: Secondary | ICD-10-CM | POA: Diagnosis not present

## 2022-08-30 DIAGNOSIS — E039 Hypothyroidism, unspecified: Secondary | ICD-10-CM | POA: Diagnosis present

## 2022-08-30 DIAGNOSIS — I4891 Unspecified atrial fibrillation: Secondary | ICD-10-CM | POA: Diagnosis present

## 2022-08-30 DIAGNOSIS — R339 Retention of urine, unspecified: Secondary | ICD-10-CM | POA: Diagnosis present

## 2022-08-30 DIAGNOSIS — I5023 Acute on chronic systolic (congestive) heart failure: Secondary | ICD-10-CM | POA: Diagnosis present

## 2022-08-30 DIAGNOSIS — Z79899 Other long term (current) drug therapy: Secondary | ICD-10-CM

## 2022-08-30 DIAGNOSIS — I2510 Atherosclerotic heart disease of native coronary artery without angina pectoris: Secondary | ICD-10-CM | POA: Diagnosis present

## 2022-08-30 DIAGNOSIS — I959 Hypotension, unspecified: Secondary | ICD-10-CM | POA: Diagnosis present

## 2022-08-30 DIAGNOSIS — I252 Old myocardial infarction: Secondary | ICD-10-CM

## 2022-08-30 DIAGNOSIS — Z880 Allergy status to penicillin: Secondary | ICD-10-CM | POA: Diagnosis not present

## 2022-08-30 DIAGNOSIS — L89310 Pressure ulcer of right buttock, unstageable: Secondary | ICD-10-CM | POA: Diagnosis present

## 2022-08-30 DIAGNOSIS — Z66 Do not resuscitate: Secondary | ICD-10-CM | POA: Diagnosis present

## 2022-08-30 DIAGNOSIS — R338 Other retention of urine: Secondary | ICD-10-CM | POA: Diagnosis present

## 2022-08-30 DIAGNOSIS — E43 Unspecified severe protein-calorie malnutrition: Secondary | ICD-10-CM | POA: Diagnosis present

## 2022-08-30 DIAGNOSIS — I110 Hypertensive heart disease with heart failure: Secondary | ICD-10-CM | POA: Diagnosis present

## 2022-08-30 DIAGNOSIS — I255 Ischemic cardiomyopathy: Secondary | ICD-10-CM | POA: Diagnosis present

## 2022-08-30 DIAGNOSIS — E785 Hyperlipidemia, unspecified: Secondary | ICD-10-CM | POA: Diagnosis present

## 2022-08-30 DIAGNOSIS — Z515 Encounter for palliative care: Secondary | ICD-10-CM | POA: Diagnosis not present

## 2022-08-30 DIAGNOSIS — N322 Vesical fistula, not elsewhere classified: Secondary | ICD-10-CM | POA: Diagnosis present

## 2022-08-30 DIAGNOSIS — Z7901 Long term (current) use of anticoagulants: Secondary | ICD-10-CM

## 2022-08-30 DIAGNOSIS — I480 Paroxysmal atrial fibrillation: Secondary | ICD-10-CM | POA: Diagnosis present

## 2022-08-30 DIAGNOSIS — Z7984 Long term (current) use of oral hypoglycemic drugs: Secondary | ICD-10-CM

## 2022-08-30 DIAGNOSIS — D649 Anemia, unspecified: Secondary | ICD-10-CM | POA: Diagnosis present

## 2022-08-30 DIAGNOSIS — K761 Chronic passive congestion of liver: Secondary | ICD-10-CM | POA: Diagnosis present

## 2022-08-30 DIAGNOSIS — E1165 Type 2 diabetes mellitus with hyperglycemia: Secondary | ICD-10-CM | POA: Diagnosis present

## 2022-08-30 DIAGNOSIS — C189 Malignant neoplasm of colon, unspecified: Secondary | ICD-10-CM | POA: Diagnosis present

## 2022-08-30 DIAGNOSIS — K651 Peritoneal abscess: Secondary | ICD-10-CM | POA: Diagnosis present

## 2022-08-30 LAB — CBC
HCT: 36.7 % (ref 36.0–46.0)
Hemoglobin: 11.4 g/dL — ABNORMAL LOW (ref 12.0–15.0)
MCH: 29.8 pg (ref 26.0–34.0)
MCHC: 31.1 g/dL (ref 30.0–36.0)
MCV: 96.1 fL (ref 80.0–100.0)
Platelets: 267 10*3/uL (ref 150–400)
RBC: 3.82 MIL/uL — ABNORMAL LOW (ref 3.87–5.11)
RDW: 19.9 % — ABNORMAL HIGH (ref 11.5–15.5)
WBC: 7.8 10*3/uL (ref 4.0–10.5)
nRBC: 0 % (ref 0.0–0.2)

## 2022-08-30 LAB — LIPASE, BLOOD: Lipase: 38 U/L (ref 11–51)

## 2022-08-30 LAB — COMPREHENSIVE METABOLIC PANEL
ALT: 24 U/L (ref 0–44)
AST: 34 U/L (ref 15–41)
Albumin: 2.7 g/dL — ABNORMAL LOW (ref 3.5–5.0)
Alkaline Phosphatase: 94 U/L (ref 38–126)
Anion gap: 12 (ref 5–15)
BUN: 25 mg/dL — ABNORMAL HIGH (ref 8–23)
CO2: 26 mmol/L (ref 22–32)
Calcium: 8.9 mg/dL (ref 8.9–10.3)
Chloride: 93 mmol/L — ABNORMAL LOW (ref 98–111)
Creatinine, Ser: 1.02 mg/dL — ABNORMAL HIGH (ref 0.44–1.00)
GFR, Estimated: 57 mL/min — ABNORMAL LOW (ref >60)
Glucose, Bld: 264 mg/dL — ABNORMAL HIGH (ref 70–99)
Potassium: 5 mmol/L (ref 3.5–5.1)
Sodium: 131 mmol/L — ABNORMAL LOW (ref 135–145)
Total Bilirubin: 0.8 mg/dL (ref 0.3–1.2)
Total Protein: 7.2 g/dL (ref 6.5–8.1)

## 2022-08-30 LAB — URINALYSIS, ROUTINE W REFLEX MICROSCOPIC
Bilirubin Urine: NEGATIVE
Glucose, UA: NEGATIVE mg/dL
Hgb urine dipstick: NEGATIVE
Ketones, ur: NEGATIVE mg/dL
Nitrite: NEGATIVE
Protein, ur: NEGATIVE mg/dL
Specific Gravity, Urine: 1.012 (ref 1.005–1.030)
pH: 7 (ref 5.0–8.0)

## 2022-08-30 LAB — CREATININE, FLUID (PLEURAL, PERITONEAL, JP DRAINAGE): Creat, Fluid: 21.6 mg/dL

## 2022-08-30 LAB — LACTIC ACID, PLASMA
Lactic Acid, Venous: 2.5 mmol/L (ref 0.5–1.9)
Lactic Acid, Venous: 2.7 mmol/L (ref 0.5–1.9)

## 2022-08-30 MED ORDER — ONDANSETRON 4 MG PO TBDP: 4.0000 mg | ORAL_TABLET | Freq: Four times a day (QID) | ORAL | Status: DC | PRN

## 2022-08-30 MED ORDER — TRAMADOL HCL 50 MG PO TABS
50.0000 mg | ORAL_TABLET | Freq: Four times a day (QID) | ORAL | Status: DC | PRN
  Administered 2022-09-07 – 2022-09-13 (×3): 50 mg via ORAL
  Filled 2022-08-30 (×5): qty 1

## 2022-08-30 MED ORDER — MAGNESIUM OXIDE -MG SUPPLEMENT 400 (240 MG) MG PO TABS
400.0000 mg | ORAL_TABLET | Freq: Every day | ORAL | Status: DC
  Administered 2022-08-31 – 2022-09-07 (×8): 400 mg via ORAL
  Filled 2022-08-30 (×8): qty 1

## 2022-08-30 MED ORDER — METRONIDAZOLE 500 MG/100ML IV SOLN
500.0000 mg | Freq: Once | INTRAVENOUS | Status: AC
  Administered 2022-08-30: 500 mg via INTRAVENOUS
  Filled 2022-08-30: qty 100

## 2022-08-30 MED ORDER — VITAMIN B-12 1000 MCG PO TABS
1000.0000 ug | ORAL_TABLET | Freq: Every day | ORAL | Status: DC
  Administered 2022-08-31 – 2022-09-08 (×9): 1000 ug via ORAL
  Filled 2022-08-30 (×10): qty 1

## 2022-08-30 MED ORDER — ONDANSETRON 4 MG PO TBDP: 4.0000 mg | ORAL_TABLET | Freq: Three times a day (TID) | ORAL | Status: DC | PRN

## 2022-08-30 MED ORDER — IPRATROPIUM BROMIDE 0.02 % IN SOLN
0.5000 mg | Freq: Two times a day (BID) | RESPIRATORY_TRACT | Status: DC
  Administered 2022-08-31 – 2022-09-01 (×4): 0.5 mg via RESPIRATORY_TRACT
  Filled 2022-08-30 (×4): qty 2.5

## 2022-08-30 MED ORDER — INSULIN ASPART 100 UNIT/ML IJ SOLN
0.0000 [IU] | Freq: Three times a day (TID) | INTRAMUSCULAR | Status: DC
  Administered 2022-08-31 – 2022-09-01 (×2): 2 [IU] via SUBCUTANEOUS
  Administered 2022-09-02: 1 [IU] via SUBCUTANEOUS
  Administered 2022-09-02: 3 [IU] via SUBCUTANEOUS
  Administered 2022-09-02 – 2022-09-03 (×2): 2 [IU] via SUBCUTANEOUS
  Administered 2022-09-03: 1 [IU] via SUBCUTANEOUS
  Administered 2022-09-03 – 2022-09-06 (×6): 2 [IU] via SUBCUTANEOUS
  Administered 2022-09-07: 1 [IU] via SUBCUTANEOUS
  Administered 2022-09-09: 2 [IU] via SUBCUTANEOUS
  Administered 2022-09-09 – 2022-09-10 (×2): 1 [IU] via SUBCUTANEOUS
  Administered 2022-09-10: 3 [IU] via SUBCUTANEOUS
  Administered 2022-09-11: 2 [IU] via SUBCUTANEOUS
  Administered 2022-09-11 (×2): 3 [IU] via SUBCUTANEOUS
  Administered 2022-09-12: 5 [IU] via SUBCUTANEOUS
  Administered 2022-09-12: 2 [IU] via SUBCUTANEOUS
  Administered 2022-09-12: 5 [IU] via SUBCUTANEOUS
  Administered 2022-09-13: 2 [IU] via SUBCUTANEOUS
  Filled 2022-08-30 (×25): qty 1

## 2022-08-30 MED ORDER — ONDANSETRON HCL 4 MG/2ML IJ SOLN
4.0000 mg | Freq: Four times a day (QID) | INTRAMUSCULAR | Status: DC | PRN
  Administered 2022-08-30: 4 mg via INTRAVENOUS
  Filled 2022-08-30: qty 2

## 2022-08-30 MED ORDER — HYDROCODONE-ACETAMINOPHEN 5-325 MG PO TABS
1.0000 | ORAL_TABLET | ORAL | Status: DC | PRN
  Administered 2022-08-31 – 2022-09-01 (×2): 1 via ORAL
  Filled 2022-08-30 (×4): qty 1

## 2022-08-30 MED ORDER — IOHEXOL 300 MG/ML  SOLN
100.0000 mL | Freq: Once | INTRAMUSCULAR | Status: AC | PRN
  Administered 2022-08-30: 100 mL via INTRAVENOUS

## 2022-08-30 MED ORDER — ALBUTEROL SULFATE (5 MG/ML) 0.5% IN NEBU: 2.5000 mg | INHALATION_SOLUTION | Freq: Four times a day (QID) | RESPIRATORY_TRACT | Status: DC | PRN

## 2022-08-30 MED ORDER — ADULT MULTIVITAMIN W/MINERALS CH
1.0000 | ORAL_TABLET | Freq: Every day | ORAL | Status: DC
  Administered 2022-08-31 – 2022-09-07 (×8): 1 via ORAL
  Filled 2022-08-30 (×8): qty 1

## 2022-08-30 MED ORDER — DOCUSATE SODIUM 100 MG PO CAPS: 100.0000 mg | ORAL_CAPSULE | Freq: Two times a day (BID) | ORAL | Status: DC | PRN

## 2022-08-30 MED ORDER — ENSURE ENLIVE PO LIQD
237.0000 mL | Freq: Three times a day (TID) | ORAL | Status: DC
  Administered 2022-08-31 – 2022-09-13 (×30): 237 mL via ORAL

## 2022-08-30 MED ORDER — MORPHINE SULFATE (PF) 2 MG/ML IV SOLN
2.0000 mg | INTRAVENOUS | Status: DC | PRN
  Administered 2022-08-30: 2 mg via INTRAVENOUS
  Filled 2022-08-30: qty 1

## 2022-08-30 MED ORDER — LACTATED RINGERS IV BOLUS
1000.0000 mL | Freq: Once | INTRAVENOUS | Status: AC
  Administered 2022-08-30: 1000 mL via INTRAVENOUS

## 2022-08-30 MED ORDER — ATORVASTATIN CALCIUM 10 MG PO TABS
10.0000 mg | ORAL_TABLET | Freq: Every day | ORAL | Status: DC
  Administered 2022-08-31 – 2022-09-05 (×6): 10 mg via ORAL
  Filled 2022-08-30 (×6): qty 1

## 2022-08-30 MED ORDER — SERTRALINE HCL 50 MG PO TABS
100.0000 mg | ORAL_TABLET | Freq: Every day | ORAL | Status: DC
  Administered 2022-08-31 – 2022-09-13 (×14): 100 mg via ORAL
  Filled 2022-08-30 (×15): qty 2

## 2022-08-30 MED ORDER — APIXABAN 2.5 MG PO TABS
2.5000 mg | ORAL_TABLET | Freq: Two times a day (BID) | ORAL | Status: DC
  Administered 2022-08-30 – 2022-09-13 (×28): 2.5 mg via ORAL
  Filled 2022-08-30 (×29): qty 1

## 2022-08-30 MED ORDER — VENLAFAXINE HCL 37.5 MG PO TABS
37.5000 mg | ORAL_TABLET | Freq: Every day | ORAL | Status: DC
  Administered 2022-08-31 – 2022-09-13 (×14): 37.5 mg via ORAL
  Filled 2022-08-30 (×14): qty 1

## 2022-08-30 MED ORDER — METRONIDAZOLE 500 MG/100ML IV SOLN
500.0000 mg | Freq: Two times a day (BID) | INTRAVENOUS | Status: DC
  Administered 2022-08-31 – 2022-09-01 (×4): 500 mg via INTRAVENOUS
  Filled 2022-08-30 (×4): qty 100

## 2022-08-30 MED ORDER — LEVOTHYROXINE SODIUM 137 MCG PO TABS
137.0000 ug | ORAL_TABLET | Freq: Every day | ORAL | Status: DC
  Administered 2022-08-31 – 2022-09-13 (×14): 137 ug via ORAL
  Filled 2022-08-30 (×14): qty 1

## 2022-08-30 MED ORDER — MEDIHONEY WOUND/BURN DRESSING EX PSTE
1.0000 | PASTE | Freq: Every day | CUTANEOUS | Status: DC
  Administered 2022-08-31 – 2022-09-13 (×14): 1 via TOPICAL
  Filled 2022-08-30 (×3): qty 44

## 2022-08-30 MED ORDER — POTASSIUM CHLORIDE CRYS ER 20 MEQ PO TBCR
20.0000 meq | EXTENDED_RELEASE_TABLET | Freq: Two times a day (BID) | ORAL | Status: DC
  Administered 2022-08-30 – 2022-09-02 (×7): 20 meq via ORAL
  Filled 2022-08-30 (×7): qty 1

## 2022-08-30 MED ORDER — ACETAMINOPHEN 325 MG PO TABS
650.0000 mg | ORAL_TABLET | ORAL | Status: DC | PRN
  Filled 2022-08-30 (×2): qty 2

## 2022-08-30 MED ORDER — METOPROLOL TARTRATE 25 MG PO TABS
25.0000 mg | ORAL_TABLET | Freq: Three times a day (TID) | ORAL | Status: DC
  Administered 2022-08-30 – 2022-09-12 (×30): 25 mg via ORAL
  Filled 2022-08-30 (×32): qty 1

## 2022-08-30 MED ORDER — ASPIRIN 81 MG PO CHEW
81.0000 mg | CHEWABLE_TABLET | Freq: Every day | ORAL | Status: DC
  Administered 2022-08-31 – 2022-09-13 (×14): 81 mg via ORAL
  Filled 2022-08-30 (×14): qty 1

## 2022-08-30 MED ORDER — IPRATROPIUM BROMIDE 0.02 % IN SOLN: 0.5000 mg | Freq: Two times a day (BID) | RESPIRATORY_TRACT | Status: DC

## 2022-08-30 MED ORDER — SPIRONOLACTONE 12.5 MG HALF TABLET
12.5000 mg | ORAL_TABLET | Freq: Every day | ORAL | Status: DC
  Administered 2022-08-31: 12.5 mg via ORAL
  Filled 2022-08-30: qty 1

## 2022-08-30 MED ORDER — CIPROFLOXACIN IN D5W 400 MG/200ML IV SOLN
400.0000 mg | Freq: Two times a day (BID) | INTRAVENOUS | Status: DC
  Administered 2022-08-30 – 2022-09-01 (×4): 400 mg via INTRAVENOUS
  Filled 2022-08-30 (×4): qty 200

## 2022-08-30 MED ORDER — FUROSEMIDE 40 MG PO TABS
40.0000 mg | ORAL_TABLET | Freq: Every day | ORAL | Status: DC
  Administered 2022-08-31: 40 mg via ORAL
  Filled 2022-08-30: qty 1

## 2022-08-30 MED ORDER — VITAMIN C 500 MG PO TABS
500.0000 mg | ORAL_TABLET | Freq: Two times a day (BID) | ORAL | Status: DC
  Administered 2022-08-30 – 2022-09-08 (×18): 500 mg via ORAL
  Filled 2022-08-30 (×18): qty 1

## 2022-08-30 MED ORDER — IPRATROPIUM-ALBUTEROL 0.5-2.5 (3) MG/3ML IN SOLN: 3.0000 mL | Freq: Four times a day (QID) | RESPIRATORY_TRACT | Status: DC | PRN

## 2022-08-30 MED ORDER — SODIUM CHLORIDE 0.9 % IV SOLN
2.0000 g | Freq: Once | INTRAVENOUS | Status: AC
  Administered 2022-08-30: 2 g via INTRAVENOUS
  Filled 2022-08-30: qty 12.5

## 2022-08-30 MED ORDER — AMIODARONE HCL 200 MG PO TABS
200.0000 mg | ORAL_TABLET | Freq: Every day | ORAL | Status: DC
  Administered 2022-08-31 – 2022-09-08 (×9): 200 mg via ORAL
  Filled 2022-08-30 (×9): qty 1

## 2022-08-30 MED ORDER — ZINC SULFATE 220 (50 ZN) MG PO CAPS
220.0000 mg | ORAL_CAPSULE | Freq: Every day | ORAL | Status: DC
  Administered 2022-08-31 – 2022-09-07 (×8): 220 mg via ORAL
  Filled 2022-08-30 (×8): qty 1

## 2022-08-30 MED ORDER — LISINOPRIL 2.5 MG PO TABS
2.5000 mg | ORAL_TABLET | Freq: Every day | ORAL | Status: DC
  Filled 2022-08-30 (×2): qty 1

## 2022-08-30 NOTE — ED Triage Notes (Signed)
Pt presents to ED via EMS from [facility] due to increasing drainage from JP tube. Pt has no complaints at the moment. A&Ox4.

## 2022-08-30 NOTE — ED Provider Notes (Signed)
[facility] Provider Note    Event Date/Time   First MD Initiated Contact with Patient 08/30/22 1149     (approximate)   History   Weakness   HPI  Sarah [name] is a 76 y.o. 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  Patient was admitted and evaluated by general surgery and IR.  She is currently been at a living facility who noted increased output from her JP drain over the past couple of days.  Worsening generalized weakness and fatigue today.  Patient denies any pain.     Physical Exam   Triage Vital Signs: ED Triage Vitals  Enc Vitals Group     BP 08/30/22 1136 102/64     Pulse Rate 08/30/22 1136 86     Resp 08/30/22 1136 18     Temp 08/30/22 1136 97.7 F (36.5 C)     Temp Source 08/30/22 1136 Rectal     SpO2 08/30/22 1131 97 %     Weight --      Height --      Head Circumference --      [facility] --      Pain Score 08/30/22 1137 0     Pain Loc --      Pain Edu? --      Excl. in [place]? --     Most recent vital signs: Vitals:   08/30/22 1400 08/30/22 1630  BP: 105/72 98/64  Pulse: 83 79  Resp: (!) 21 20  Temp:  98.3 F (36.8 C)  SpO2: 100% 99%     General: Awake, no distress.  Chronically ill-appearing CV:  Good peripheral perfusion.  Resp:  Normal effort.  Abd:  No distention.  Multiple abdominal scars.  Tenderness to palpation to the lower abdomen.  JP drains in place with significant serosanguineous output. Other:  Sacral wound   ED Results / Procedures / Treatments   Labs (all labs ordered are listed, but only abnormal results are displayed) Labs Reviewed  CBC - Abnormal; Notable for the following components:      Result Value   RBC 3.82 (*)    Hemoglobin 11.4 (*)    RDW 19.9 (*)    All other  components within normal limits  COMPREHENSIVE METABOLIC PANEL - Abnormal; Notable for the following components:   Sodium 131 (*)    Chloride 93 (*)    Glucose, Bld 264 (*)    BUN 25 (*)    Creatinine, Ser 1.02 (*)    Albumin 2.7 (*)    GFR, Estimated 57 (*)    All other components within normal limits  URINALYSIS, ROUTINE W REFLEX MICROSCOPIC - Abnormal; Notable for the following components:   Color, Urine YELLOW (*)    APPearance HAZY (*)    Leukocytes,Ua SMALL (*)    Bacteria, UA RARE (*)    All other components within normal limits  LACTIC ACID, PLASMA - Abnormal; Notable for the following components:   Lactic Acid, Venous 2.5 (*)    All other components within normal limits  LACTIC ACID, PLASMA - Abnormal; Notable for the following components:   Lactic Acid, Venous 2.7 (*)    All other components within normal limits  CULTURE, BLOOD (ROUTINE X 2)  CULTURE, BLOOD (ROUTINE X 2)  LIPASE, BLOOD  CREATININE, FLUID (PLEURAL, PERITONEAL, JP DRAINAGE)     EKG     RADIOLOGY CT abdomen and pelvis independently reviewed with findings concerning for an abscess that is very close to the bladder.  CT scan was read as acute absces  PROCEDURES:  Critical Care performed: No  Procedures   MEDICATIONS ORDERED IN ED: Medications  ceFEPIme (MAXIPIME) 2 g in sodium chloride 0.9 % 100 mL IVPB (2 g Intravenous New Bag/Given 08/30/22 1616)  metroNIDAZOLE (FLAGYL) IVPB 500 mg (has no administration in time range)  lactated ringers bolus 1,000 mL (0 mLs Intravenous Stopped 08/30/22 1400)  iohexol (OMNIPAQUE) 300 MG/ML solution 100 mL (100 mLs Intravenous Contrast Given 08/30/22 1312)     IMPRESSION / MDM / ASSESSMENT AND PLAN / ED COURSE  I reviewed the triage vital signs and the nursing notes.   Differential diagnosis includes, but is not limited to, intra-abdominal abscess, sepsis, urinary tract infection  Patient with leukocytosis and elevated lactic acid of 2.7.  Patient given a  liter of IV fluids.  After evaluation of CT scan with new intra-abdominal abscess added on blood cultures and will start the patient on antibiotics.  Patient received IV cefepime and Flagyl given her severe penicillin allergy.  Consulted general surgery and discussed patient's case with Dr. Lysle Pearl, evaluated the patient in the emergency department.  Concern for bladder fistula.  Foley catheter was placed.  Decreased output from JP drain.  Patient was admitted to general surgery  Patient's presentation is most consistent with acute presentation with potential threat to life or bodily function.       FINAL CLINICAL IMPRESSION(S) / ED DIAGNOSES   Final diagnoses:  Postprocedural intraabdominal abscess     Rx / DC Orders   ED Discharge Orders     None        Note:  This document was prepared using Dragon voice recognition software and may include unintentional dictation errors.   Nathaniel Man, MD 08/30/22 [phone]

## 2022-08-30 NOTE — H&P (Addendum)
Subjective:   CC: increased JP drainage  HPI:  Sarah [name] is a 76 y.o. female who was consulted by [facility] for issue above.  Symptoms were first noted 2 days ago. Pt has no complaints of pain, but SNF staff noted increased JP drainage, clear and serous, filling JP over and over.    She cannot recall last time she had her foley catheter removed.  She cannot recall last time she has urinated.  She otherwise has been having good ostomy output.  No issues with midline wound that she can recall.   Past Medical History:  has a past medical history of Arthritis, Cancer ([place]), Diabetes mellitus without complication ([place]), Hyperlipidemia, Hypertension, Hypothyroidism, and IBS (irritable bowel syndrome).  Past Surgical History:  Past Surgical History:  Procedure Laterality Date   ABDOMINAL HYSTERECTOMY     CATARACT EXTRACTION W/PHACO Left 12/21/2016   Procedure: CATARACT EXTRACTION PHACO AND INTRAOCULAR LENS PLACEMENT ([place])  left eye;  Surgeon: Leandrew Koyanagi, MD;  Location: [place];  Service: Ophthalmology;  Laterality: Left;  Diabetic - oral meds   CATARACT EXTRACTION W/PHACO Right 01/25/2017   Procedure: CATARACT EXTRACTION PHACO AND INTRAOCULAR LENS PLACEMENT ([place])  right diabetic;  Surgeon: Leandrew Koyanagi, MD;  Location: [place];  Service: Ophthalmology;  Laterality: Right;  diabetic - oral meds   CHOLECYSTECTOMY     COLONOSCOPY WITH PROPOFOL N/A 10/26/2016   Procedure: COLONOSCOPY WITH PROPOFOL;  Surgeon: Manya Silvas, MD;  Location: [facility] END[facility]COPY;  Service: Endoscopy;  Laterality: N/A;   IR RADIOLOGIST EVAL & MGMT  06/14/2022   KNEE SURGERY Right    LEFT HEART CATH AND CORONARY ANGIOGRAPHY N/A 07/29/2022   Procedure: LEFT HEART CATH AND CORONARY ANGIOGRAPHY;  Surgeon: Yolonda Kida, MD;  Location: [place] CV LAB;  Service: Cardiovascular;  Laterality: N/A;    Family History: family history is not on file.  Social History:  reports that she has  never smoked. She has never used smokeless tobacco. She reports that she does not drink alcohol and does not use drugs.  Current Medications:  Prior to Admission medications   Medication Sig Start Date End Date Taking? Authorizing Provider  acetaminophen (TYLENOL) 325 MG tablet Take 2 tablets (650 mg total) by mouth every 4 (four) hours as needed for headache or mild pain. 08/01/22  Yes Annita Brod, MD  amiodarone (PACERONE) 200 MG tablet Take 1 tablet (200 mg total) by mouth daily. 08/02/22  Yes Annita Brod, MD  apixaban (ELIQUIS) 2.5 MG TABS tablet Take 1 tablet (2.5 mg total) by mouth 2 (two) times daily. 08/16/22  Yes Jenniffer Vessels, DO  ascorbic acid (VITAMIN C) 500 MG tablet Take 1 tablet (500 mg total) by mouth 2 (two) times daily. 08/01/22  Yes Annita Brod, MD  aspirin 81 MG chewable tablet Chew 1 tablet (81 mg total) by mouth daily. 08/02/22  Yes Annita Brod, MD  atorvastatin (LIPITOR) 10 MG tablet Take 10 mg by mouth daily.   Yes [provider]  cyanocobalamin 1000 MCG tablet Take 1,000 mcg by mouth daily.   Yes [provider]  furosemide (LASIX) 40 MG tablet Take 40 mg by mouth daily.   Yes [provider]  glimepiride (AMARYL) 4 MG tablet Take 4 mg by mouth daily with breakfast. 09/29/21 09/29/22 Yes [provider]  ipratropium (ATROVENT) 0.02 % nebulizer solution Take 2.5 mLs (0.5 mg total) by nebulization 2 (two) times daily. 08/01/22  Yes Annita Brod, MD  leptospermum manuka honey (MEDIHONEY) PSTE paste Apply 1 Application topically daily. Apply to sacral wound, top with saline moistened gauze dressing, dry dressing and secure with silicone bordered foam. Apply thin layer (3 mm) to wound. 08/16/22  Yes Jon Lall, DO  levalbuterol (XOPENEX) 0.63 MG/3ML nebulizer solution Take 3 mLs (0.63 mg total) by nebulization 2 (two) times daily. 08/01/22  Yes Annita Brod, MD  levothyroxine (SYNTHROID) 137 MCG tablet Take 137 mcg by  mouth daily before breakfast.   Yes [provider]  lisinopril (ZESTRIL) 2.5 MG tablet Take 1 tablet (2.5 mg total) by mouth daily. 08/02/22  Yes Annita Brod, MD  magnesium oxide (MAG-OX) 400 (240 Mg) MG tablet Take 400 mg by mouth daily.   Yes [provider]  metoprolol tartrate (LOPRESSOR) 25 MG tablet Take 1 tablet (25 mg total) by mouth 3 (three) times daily. 08/01/22  Yes Annita Brod, MD  Multiple Vitamin (MULTIVITAMIN WITH MINERALS) TABS tablet Take 1 tablet by mouth daily. 08/02/22  Yes Annita Brod, MD  potassium chloride SA (KLOR-CON M) 20 MEQ tablet Take 20 mEq by mouth 2 (two) times daily.   Yes [provider]  sertraline (ZOLOFT) 100 MG tablet Take 100 mg by mouth daily.   Yes [provider]  spironolactone (ALDACTONE) 25 MG tablet Take 0.5 tablets (12.5 mg total) by mouth daily. 08/02/22  Yes Annita Brod, MD  venlafaxine (EFFEXOR) 37.5 MG tablet Take 37.5 mg by mouth daily.   Yes [provider]  zinc sulfate 220 (50 Zn) MG capsule Take 1 capsule (220 mg total) by mouth daily. 08/02/22  Yes Annita Brod, MD  atorvastatin (LIPITOR) 40 MG tablet Take 1 tablet (40 mg total) by mouth at bedtime. Patient not taking: Reported on 08/30/2022 08/01/22   Annita Brod, MD  feeding supplement (ENSURE ENLIVE / ENSURE PLUS) LIQD Take 237 mLs by mouth 3 (three) times daily between meals. 08/01/22   Annita Brod, MD  ipratropium-albuterol (DUONEB) 0.5-2.5 (3) MG/3ML SOLN Take 3 mLs by nebulization every 6 (six) hours as needed. 08/01/22   Annita Brod, MD  ondansetron (ZOFRAN-ODT) 4 MG disintegrating tablet Take 1 tablet (4 mg total) by mouth every 8 (eight) hours as needed for nausea or vomiting. 06/05/22   Arta Silence, MD    Allergies:  Allergies as of 08/30/2022 - Review Complete 08/30/2022  Allergen Reaction Noted   Penicillins Anaphylaxis 06/21/2015   Sulfa antibiotics Rash 10/25/2016    ROS:  General:  Denies weight loss, weight gain, fatigue, fevers, chills, and night sweats. Eyes: Denies blurry vision, double vision, eye pain, itchy eyes, and tearing. Ears: Denies hearing loss, earache, and ringing in ears. Nose: Denies sinus pain, congestion, infections, runny nose, and nosebleeds. Mouth/throat: Denies hoarseness, sore throat, bleeding gums, and difficulty swallowing. Heart: Denies chest pain, palpitations, racing heart, irregular heartbeat, leg pain or swelling, and decreased activity tolerance. Respiratory: Denies breathing difficulty, shortness of breath, wheezing, cough, and sputum. GI: Denies change in appetite, heartburn, nausea, vomiting, constipation, diarrhea, and blood in stool. GU: Denies difficulty urinating, pain with urinating, urgency, frequency, blood in urine. Musculoskeletal: Denies joint stiffness, pain, swelling, muscle weakness. Skin: Denies rash, itching, mass, tumors, sores, and boils Neurologic: Denies headache, fainting, dizziness, seizures, numbness, and tingling. Psychiatric: Denies depression, anxiety, difficulty sleeping, and memory loss. Endocrine: Denies heat or cold intolerance, and increased thirst or urination. Blood/lymph: Denies easy bruising, easy bruising, and swollen glands     Objective:  BP 105/72   Pulse 83   Temp 97.7 F (36.5 C) (Rectal)   Resp (!) 21   SpO2 100%   Constitutional :  alert, cooperative, appears stated age, and no distress  Lymphatics/Throat:  no asymmetry, masses, or scars  Respiratory:  clear to auscultation bilaterally  Cardiovascular:  regular rate and rhythm  Gastrointestinal: soft, non-tender; bowel sounds normal; no masses,  no organomegaly. Drain with serosanguinous drainage that immediately fills back with yellow colored fluid.  Musculoskeletal: Steady movement  Skin: Cool and moist, midline surgical wound open, good granulation tissue but with defect I middle of granulation tissue with clear and some scant  purulent drainage. Gently probing of area did not indicate any obvious fascial defect, but tunneling is noted circumfrentially, deep to the superficial granulation tissue forming around the visible defect.     Psychiatric: Normal affect, non-agitated, not confused       LABS:     Latest Ref Rng & Units 08/30/2022   11:53 AM 08/15/2022    6:00 AM 08/13/2022    6:50 AM  CMP  Glucose 70 - 99 mg/dL 264  268  91   BUN 8 - 23 mg/dL '25  19  26   '$ Creatinine 0.44 - 1.00 mg/dL 1.02  0.65  0.70   Sodium 135 - 145 mmol/L 131  133  133   Potassium 3.5 - 5.1 mmol/L 5.0  3.7  3.6   Chloride 98 - 111 mmol/L 93  97  95   CO2 22 - 32 mmol/L '26  29  30   '$ Calcium 8.9 - 10.3 mg/dL 8.9  7.5  7.8   Total Protein 6.5 - 8.1 g/dL 7.2     Total Bilirubin 0.3 - 1.2 mg/dL 0.8     Alkaline Phos 38 - 126 U/L 94     AST 15 - 41 U/L 34     ALT 0 - 44 U/L 24         Latest Ref Rng & Units 08/30/2022   11:53 AM 08/15/2022    6:00 AM 08/13/2022    6:50 AM  CBC  WBC 4.0 - 10.5 K/uL 7.8  4.0  4.8   Hemoglobin 12.0 - 15.0 g/dL 11.4  9.1  9.1   Hematocrit 36.0 - 46.0 % 36.7  29.2  29.0   Platelets 150 - 400 K/uL 267  163  144     RADS: CLINICAL DATA:  Worsening abdominal pain. Follow-up abscess. Recent colostomy. Increased drainage from percutaneous drain. Colon cancer. * Tracking Code: BO *   EXAM: CT ABDOMEN AND PELVIS WITH CONTRAST   TECHNIQUE: Multidetector CT imaging of the abdomen and pelvis was performed using the standard protocol following bolus administration of intravenous contrast.   RADIATION DOSE REDUCTION: This exam was performed according to the departmental dose-optimization program which includes automated exposure control, adjustment of the mA and/or kV according to patient size and/or use of iterative reconstruction technique.   CONTRAST:  139m OMNIPAQUE IOHEXOL 300 MG/ML  SOLN   COMPARISON:  08/16/2022   FINDINGS: Lower Chest: No significant change in small to moderate  bilateral pleural effusions and dependent atelectasis.   Hepatobiliary: No hepatic masses identified. Prior cholecystectomy. No evidence of biliary obstruction.   Pancreas:  No mass or inflammatory changes.   Spleen: Within normal limits in size and appearance.   Adrenals/Urinary Tract: No suspicious masses identified. No evidence of ureteral calculi or hydronephrosis.   Stomach/Bowel: Postop changes again seen from sigmoid colon resection  with descending colostomy. Evidence of bowel obstruction. Percutaneous drainage catheter remains in place in the left pelvis. A small rim enhancing fluid collection is seen in the left pelvis adjacent to the catheter which measures 3.3 x 2.0 cm and is new since previous study. Several other loculated pelvic fluid collections have resolved since prior exam. A persistent gas and fluid collection is seen in the left iliopsoas region which measures 4.7 by 3.6 cm, without significant change since prior study.   Vascular/Lymphatic: No pathologically enlarged lymph nodes. No acute vascular findings. Aortic atherosclerotic calcification incidentally noted.   Reproductive: Prior hysterectomy noted. Adnexal regions are unremarkable in appearance.   Other:  None.   Musculoskeletal:  No suspicious bone lesions identified.   IMPRESSION: Postop changes from sigmoid colectomy with descending colostomy.   New 3.3 cm rim enhancing fluid collection in left pelvis adjacent to the pigtail drainage catheter.   Stable 4.7 cm gas and fluid collection in left iliopsoas region.   Stable small to moderate bilateral pleural effusions and dependent atelectasis.   Aortic Atherosclerosis (ICD10-I70.0).     Electronically Signed   By: Marlaine Hind M.D.   On: 08/30/2022 13:39 Assessment:   Increased JP output.  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  Plan:   Foley placed and drainage from JP significantly dropped as soon as foley emptied bladder.  Will send JP fluid for Cr to confirm the bladder fistula.  Urology consult to discuss next steps as well.  Lactic acid elevated so will start on emperic antibiotics in the meantime. U/A and culture sent. Discussed case with IR and new fluid collection noted is close enough to new "abscess" that re-positioning may not be needed.     Midline wound output also needs to be monitored as well to see if that decreases as well with bladder decompression. Possible wound vac to isolate area if possible with ostomy appliance adjacent to it?  The patient verbalized understanding and all questions were answered to the patient's satisfaction.  labs/images/medications/previous chart entries reviewed personally and relevant changes/updates noted above.

## 2022-08-30 NOTE — ED Notes (Signed)
Pt taken to rm 38 and placed on cardiac monitor. Pt has no needs at this time. Call light within reach.

## 2022-08-30 NOTE — Consult Note (Signed)
Urology Consult  Requesting physician: Benjamine Sprague, DO  Reason for consultation: Bladder fistula  Chief Complaint: None  History of Present Illness: Sarah [name] is a 76 y.o. female originally admitted 05/28/2022 with perforated diverticulitis with abscess initially managed with percutaneous drainage by IR.  Underwent laparoscopic sigmoid colon resection 07/19/2022 however required conversion to an open laparotomy and Hartman's procedure.  Postoperative course complicated by atrial fibrillation with RVR, hypotension and NSTEMI.  Pathology remarkable for invasive adenocarcinoma. Discharged 08/01/2022  Readmitted 08/11/2022 found to have Pseudomonas infection of midline open wound.  Also found to have intra-abdominal abscesses which were drained percutaneously by IR.  Developed urinary retention during that hospitalization and was discharged with an indwelling Foley  I saw her in the office 08/25/2022 for a voiding trial.  Her catheter was removed that morning and she was to follow-up in the afternoon for a repeat bladder scan however was not transported back to the office by SNF for follow-up imaging.  Transported to the ED today by EMS after SNF noted significant output from one of her JP drains over the last 2 days.  Patient unclear when she last voided.  CT scan today showed bladder distention.  After Foley catheter was placed there was marked decrease in output from her JP drain.  A creatinine around her JP fluid was 21.6 indicative of urine.  She has no complaints and denies abdominal pain, fever or chills.  She was noted to have an elevated lactic acid and was started empirically on IV antibiotics    Past Medical History:  Diagnosis Date   Arthritis    hands   Cancer ([place])    thyroid- radiation   Diabetes mellitus without complication ([place])    Hyperlipidemia    Hypertension    Hypothyroidism    IBS (irritable bowel syndrome)     Past Surgical History:  Procedure Laterality Date    ABDOMINAL HYSTERECTOMY     CATARACT EXTRACTION W/PHACO Left 12/21/2016   Procedure: CATARACT EXTRACTION PHACO AND INTRAOCULAR LENS PLACEMENT ([place])  left eye;  Surgeon: Leandrew Koyanagi, MD;  Location: [place];  Service: Ophthalmology;  Laterality: Left;  Diabetic - oral meds   CATARACT EXTRACTION W/PHACO Right 01/25/2017   Procedure: CATARACT EXTRACTION PHACO AND INTRAOCULAR LENS PLACEMENT ([place])  right diabetic;  Surgeon: Leandrew Koyanagi, MD;  Location: [place];  Service: Ophthalmology;  Laterality: Right;  diabetic - oral meds   CHOLECYSTECTOMY     COLONOSCOPY WITH PROPOFOL N/A 10/26/2016   Procedure: COLONOSCOPY WITH PROPOFOL;  Surgeon: Manya Silvas, MD;  Location: [facility] ENDOSCOPY;  Service: Endoscopy;  Laterality: N/A;   IR RADIOLOGIST EVAL & MGMT  06/14/2022   KNEE SURGERY Right    LEFT HEART CATH AND CORONARY ANGIOGRAPHY N/A 07/29/2022   Procedure: LEFT HEART CATH AND CORONARY ANGIOGRAPHY;  Surgeon: Yolonda Kida, MD;  Location: [place] CV LAB;  Service: Cardiovascular;  Laterality: N/A;    Home Medications:  Current Meds  Medication Sig   acetaminophen (TYLENOL) 325 MG tablet Take 2 tablets (650 mg total) by mouth every 4 (four) hours as needed for headache or mild pain.   amiodarone (PACERONE) 200 MG tablet Take 1 tablet (200 mg total) by mouth daily.   apixaban (ELIQUIS) 2.5 MG TABS tablet Take 1 tablet (2.5 mg total) by mouth 2 (two) times daily.   ascorbic acid (VITAMIN C) 500 MG tablet Take 1 tablet (500 mg total) by mouth 2 (two) times daily.   aspirin 81  MG chewable tablet Chew 1 tablet (81 mg total) by mouth daily.   atorvastatin (LIPITOR) 10 MG tablet Take 10 mg by mouth daily.   cyanocobalamin 1000 MCG tablet Take 1,000 mcg by mouth daily.   furosemide (LASIX) 40 MG tablet Take 40 mg by mouth daily.   glimepiride (AMARYL) 4 MG tablet Take 4 mg by mouth daily with breakfast.   ipratropium (ATROVENT) 0.02 % nebulizer solution Take 2.5  mLs (0.5 mg total) by nebulization 2 (two) times daily.   leptospermum manuka honey (MEDIHONEY) PSTE paste Apply 1 Application topically daily. Apply to sacral wound, top with saline moistened gauze dressing, dry dressing and secure with silicone bordered foam. Apply thin layer (3 mm) to wound.   levalbuterol (XOPENEX) 0.63 MG/3ML nebulizer solution Take 3 mLs (0.63 mg total) by nebulization 2 (two) times daily.   levothyroxine (SYNTHROID) 137 MCG tablet Take 137 mcg by mouth daily before breakfast.   lisinopril (ZESTRIL) 2.5 MG tablet Take 1 tablet (2.5 mg total) by mouth daily.   magnesium oxide (MAG-OX) 400 (240 Mg) MG tablet Take 400 mg by mouth daily.   metoprolol tartrate (LOPRESSOR) 25 MG tablet Take 1 tablet (25 mg total) by mouth 3 (three) times daily.   Multiple Vitamin (MULTIVITAMIN WITH MINERALS) TABS tablet Take 1 tablet by mouth daily.   potassium chloride SA (KLOR-CON M) 20 MEQ tablet Take 20 mEq by mouth 2 (two) times daily.   sertraline (ZOLOFT) 100 MG tablet Take 100 mg by mouth daily.   spironolactone (ALDACTONE) 25 MG tablet Take 0.5 tablets (12.5 mg total) by mouth daily.   venlafaxine (EFFEXOR) 37.5 MG tablet Take 37.5 mg by mouth daily.   zinc sulfate 220 (50 Zn) MG capsule Take 1 capsule (220 mg total) by mouth daily.    Allergies:  Allergies  Allergen Reactions   Penicillins Anaphylaxis    TOLERATED CEFOTETAN 06/2022   Sulfa Antibiotics Rash    Family History  Problem Relation Age of Onset   Breast cancer Neg Hx     Social History:  reports that she has never smoked. She has never used smokeless tobacco. She reports that she does not drink alcohol and does not use drugs.  ROS: A complete review of systems was performed.  All systems are negative except for pertinent findings as noted.  Physical Exam:  Vital signs in last 24 hours: Temp:  [97.7 F (36.5 C)-98.3 F (36.8 C)] 98.3 F (36.8 C) (10/03 1630) Pulse Rate:  [79-86] 79 (10/03 1630) Resp:   [18-22] 20 (10/03 1630) BP: (98-105)/(64-72) 98/64 (10/03 1630) SpO2:  [97 %-100 %] 99 % (10/03 1630) Constitutional:  Alert, No acute distress HEENT: [place] AT GI: Abdomen is soft, nontender, nondistended, no abdominal masses GU: No CVA tenderness.  Foley catheter draining clear urine.  No significant fluid in JP drains   Laboratory Data:  Recent Labs    08/30/22 1153  WBC 7.8  HGB 11.4*  HCT 36.7   Recent Labs    08/30/22 1153  NA 131*  K 5.0  CL 93*  CO2 26  GLUCOSE 264*  BUN 25*  CREATININE 1.02*  CALCIUM 8.9     Radiologic Imaging: CT images were personally reviewed and interpreted  CT Abdomen Pelvis W Contrast  Result Date: 08/30/2022 CLINICAL DATA:  Worsening abdominal pain. Follow-up abscess. Recent colostomy. Increased drainage from percutaneous drain. Colon cancer. * Tracking Code: BO * EXAM: CT ABDOMEN AND PELVIS WITH CONTRAST TECHNIQUE: Multidetector CT imaging of the abdomen and  pelvis was performed using the standard protocol following bolus administration of intravenous contrast. RADIATION DOSE REDUCTION: This exam was performed according to the departmental dose-optimization program which includes automated exposure control, adjustment of the mA and/or kV according to patient size and/or use of iterative reconstruction technique. CONTRAST:  137m OMNIPAQUE IOHEXOL 300 MG/ML  SOLN COMPARISON:  08/16/2022 FINDINGS: Lower Chest: No significant change in small to moderate bilateral pleural effusions and dependent atelectasis. Hepatobiliary: No hepatic masses identified. Prior cholecystectomy. No evidence of biliary obstruction. Pancreas:  No mass or inflammatory changes. Spleen: Within normal limits in size and appearance. Adrenals/Urinary Tract: No suspicious masses identified. No evidence of ureteral calculi or hydronephrosis. Stomach/Bowel: Postop changes again seen from sigmoid colon resection with descending colostomy. Evidence of bowel obstruction. Percutaneous  drainage catheter remains in place in the left pelvis. A small rim enhancing fluid collection is seen in the left pelvis adjacent to the catheter which measures 3.3 x 2.0 cm and is new since previous study. Several other loculated pelvic fluid collections have resolved since prior exam. A persistent gas and fluid collection is seen in the left iliopsoas region which measures 4.7 by 3.6 cm, without significant change since prior study. Vascular/Lymphatic: No pathologically enlarged lymph nodes. No acute vascular findings. Aortic atherosclerotic calcification incidentally noted. Reproductive: Prior hysterectomy noted. Adnexal regions are unremarkable in appearance. Other:  None. Musculoskeletal:  No suspicious bone lesions identified. IMPRESSION: Postop changes from sigmoid colectomy with descending colostomy. New 3.3 cm rim enhancing fluid collection in left pelvis adjacent to the pigtail drainage catheter. Stable 4.7 cm gas and fluid collection in left iliopsoas region. Stable small to moderate bilateral pleural effusions and dependent atelectasis. Aortic Atherosclerosis (ICD10-I70.0). Electronically Signed   By: JMarlaine HindM.D.   On: 08/30/2022 13:39    Impression/Recommendation:   1.  Bladder perforation/fistula Marked pelvic inflammation with recent perforated sigmoid diverticulum with abscess; exploratory laparotomy/Hartman's procedure; development of intra-abdominal/pelvic abscesses requiring IR drainage.  Recent onset of urinary retention without catheter drainage most likely precipitating factor No significant JP drainage since Foley catheter was placed.  She is afebrile and has no abdominal pain Recommend conservative management with Foley catheter drainage Take bladder JP off suction CT cystogram 7 days The etiology of her urinary retention is unknown and recommend long-term Foley catheter drainage for now Will follow   08/30/2022, 9:01 PM  SJohn Giovanni  MD

## 2022-08-31 ENCOUNTER — Other Ambulatory Visit: Payer: Self-pay | Admitting: Physician Assistant

## 2022-08-31 DIAGNOSIS — R339 Retention of urine, unspecified: Secondary | ICD-10-CM | POA: Diagnosis not present

## 2022-08-31 DIAGNOSIS — R32 Unspecified urinary incontinence: Secondary | ICD-10-CM

## 2022-08-31 DIAGNOSIS — N322 Vesical fistula, not elsewhere classified: Secondary | ICD-10-CM | POA: Diagnosis not present

## 2022-08-31 LAB — CBC
HCT: 31 % — ABNORMAL LOW (ref 36.0–46.0)
Hemoglobin: 9.6 g/dL — ABNORMAL LOW (ref 12.0–15.0)
MCH: 30.3 pg (ref 26.0–34.0)
MCHC: 31 g/dL (ref 30.0–36.0)
MCV: 97.8 fL (ref 80.0–100.0)
Platelets: 227 10*3/uL (ref 150–400)
RBC: 3.17 MIL/uL — ABNORMAL LOW (ref 3.87–5.11)
RDW: 19.9 % — ABNORMAL HIGH (ref 11.5–15.5)
WBC: 6.1 10*3/uL (ref 4.0–10.5)
nRBC: 0 % (ref 0.0–0.2)

## 2022-08-31 LAB — BASIC METABOLIC PANEL
Anion gap: 5 (ref 5–15)
BUN: 22 mg/dL (ref 8–23)
CO2: 28 mmol/L (ref 22–32)
Calcium: 8.5 mg/dL — ABNORMAL LOW (ref 8.9–10.3)
Chloride: 101 mmol/L (ref 98–111)
Creatinine, Ser: 0.87 mg/dL (ref 0.44–1.00)
GFR, Estimated: >60 mL/min (ref >60)
Glucose, Bld: 86 mg/dL (ref 70–99)
Potassium: 5 mmol/L (ref 3.5–5.1)
Sodium: 134 mmol/L — ABNORMAL LOW (ref 135–145)

## 2022-08-31 LAB — MAGNESIUM: Magnesium: 1.8 mg/dL (ref 1.7–2.4)

## 2022-08-31 LAB — PHOSPHORUS: Phosphorus: 3.6 mg/dL (ref 2.5–4.6)

## 2022-08-31 LAB — GLUCOSE, CAPILLARY
Glucose-Capillary: 71 mg/dL (ref 70–99)
Glucose-Capillary: 153 mg/dL — ABNORMAL HIGH (ref 70–99)
Glucose-Capillary: 97 mg/dL (ref 70–99)
Glucose-Capillary: 84 mg/dL (ref 70–99)

## 2022-08-31 MED ORDER — CHLORHEXIDINE GLUCONATE CLOTH 2 % EX PADS
6.0000 | MEDICATED_PAD | Freq: Every day | CUTANEOUS | Status: DC
  Administered 2022-08-31 – 2022-09-13 (×14): 6 via TOPICAL

## 2022-08-31 MED ORDER — SODIUM CHLORIDE 0.9 % IV BOLUS
500.0000 mL | Freq: Once | INTRAVENOUS | Status: AC
  Administered 2022-08-31: 500 mL via INTRAVENOUS

## 2022-08-31 MED ORDER — LACTATED RINGERS IV SOLN: INTRAVENOUS | Status: DC

## 2022-08-31 MED ORDER — ORAL CARE MOUTH RINSE: 15.0000 mL | OROMUCOSAL | Status: DC | PRN

## 2022-08-31 NOTE — Progress Notes (Signed)
Urology Inpatient Progress Note  Subjective: No acute events overnight.  She is afebrile and hypotensive. WBC count down today, 6.1.  Hemoglobin down today, 9.6.  Creatinine down today, 0.87. Foley catheter in place draining clear, yellow urine.  2 abdominal drains in place: one is draining turbid, serosanguineous fluid and the other is draining clear, yellow fluid.  Both remain on suction drainage today. He denies abdominal discomfort today.  Anti-infectives: Anti-infectives (From admission, onward)    Start     Dose/Rate Route Frequency Ordered Stop   08/30/22 2215  ciprofloxacin (CIPRO) IVPB 400 mg        400 mg 200 mL/hr over 60 Minutes Intravenous Every 12 hours 08/30/22 2214 09/06/22 2214   08/30/22 2215  metroNIDAZOLE (FLAGYL) IVPB 500 mg        500 mg 100 mL/hr over 60 Minutes Intravenous Every 12 hours 08/30/22 2214 09/06/22 2214   08/30/22 1530  ceFEPIme (MAXIPIME) 2 g in sodium chloride 0.9 % 100 mL IVPB        2 g 200 mL/hr over 30 Minutes Intravenous  Once 08/30/22 1515 08/30/22 1647   08/30/22 1530  metroNIDAZOLE (FLAGYL) IVPB 500 mg        500 mg 100 mL/hr over 60 Minutes Intravenous  Once 08/30/22 1515 08/30/22 1812       Current Facility-Administered Medications  Medication Dose Route Frequency Provider Last Rate Last Admin   acetaminophen (TYLENOL) tablet 650 mg  650 mg Oral Q4H PRN Sakai, Isami, DO       amiodarone (PACERONE) tablet 200 mg  200 mg Oral Daily Sakai, Isami, DO       apixaban (ELIQUIS) tablet 2.5 mg  2.5 mg Oral BID Sakai, Isami, DO   2.5 mg at 08/30/22 2310   ascorbic acid (VITAMIN C) tablet 500 mg  500 mg Oral BID Sakai, Isami, DO   500 mg at 08/30/22 2310   aspirin chewable tablet 81 mg  81 mg Oral Daily Sakai, Isami, DO       atorvastatin (LIPITOR) tablet 10 mg  10 mg Oral Daily Sakai, Isami, DO       ciprofloxacin (CIPRO) IVPB 400 mg  400 mg Intravenous Q12H Sakai, Isami, DO   Stopped at 08/31/22 0028   cyanocobalamin (VITAMIN B12) tablet  1,000 mcg  1,000 mcg Oral Daily Sakai, Isami, DO       docusate sodium (COLACE) capsule 100 mg  100 mg Oral BID PRN Sakai, Isami, DO       feeding supplement (ENSURE ENLIVE / ENSURE PLUS) liquid 237 mL  237 mL Oral TID BM Sakai, Isami, DO       furosemide (LASIX) tablet 40 mg  40 mg Oral Daily Sakai, Isami, DO       HYDROcodone-acetaminophen (NORCO/VICODIN) 5-325 MG per tablet 1-2 tablet  1-2 tablet Oral Q4H PRN Lysle Pearl, Isami, DO   1 tablet at 08/31/22 0517   insulin aspart (novoLOG) injection 0-9 Units  0-9 Units Subcutaneous TID WC Sakai, Isami, DO       ipratropium (ATROVENT) nebulizer solution 0.5 mg  0.5 mg Nebulization BID Renda Rolls, RPH   0.5 mg at 08/31/22 0829   ipratropium-albuterol (DUONEB) 0.5-2.5 (3) MG/3ML nebulizer solution 3 mL  3 mL Nebulization Q6H PRN Sakai, Isami, DO       leptospermum manuka honey (MEDIHONEY) paste 1 Application  1 Application Topical Daily Sakai, Isami, DO       levothyroxine (SYNTHROID) tablet 137 mcg  137 mcg Oral Q0600  Lysle Pearl, Isami, DO   137 mcg at 08/31/22 0524   lisinopril (ZESTRIL) tablet 2.5 mg  2.5 mg Oral Daily Sakai, Isami, DO       magnesium oxide (MAG-OX) tablet 400 mg  400 mg Oral Daily Sakai, Isami, DO       metoprolol tartrate (LOPRESSOR) tablet 25 mg  25 mg Oral TID Sakai, Isami, DO   25 mg at 08/30/22 2312   metroNIDAZOLE (FLAGYL) IVPB 500 mg  500 mg Intravenous Q12H Sakai, Isami, DO   Stopped at 08/31/22 8309   morphine (PF) 2 MG/ML injection 2 mg  2 mg Intravenous Q4H PRN Lysle Pearl, Isami, DO   2 mg at 08/30/22 2310   multivitamin with minerals tablet 1 tablet  1 tablet Oral Daily Sakai, Isami, DO       ondansetron (ZOFRAN-ODT) disintegrating tablet 4 mg  4 mg Oral Q6H PRN Sakai, Isami, DO       Or   ondansetron [facility]) injection 4 mg  4 mg Intravenous Q6H PRN Lysle Pearl, Isami, DO   4 mg at 08/30/22 2310   potassium chloride SA (KLOR-CON M) CR tablet 20 mEq  20 mEq Oral BID Sakai, Isami, DO   20 mEq at 08/30/22 2310   sertraline (ZOLOFT)  tablet 100 mg  100 mg Oral Daily Sakai, Isami, DO       spironolactone (ALDACTONE) tablet 12.5 mg  12.5 mg Oral Daily Sakai, Isami, DO       traMADol (ULTRAM) tablet 50 mg  50 mg Oral Q6H PRN Sakai, Isami, DO       venlafaxine [facility]) tablet 37.5 mg  37.5 mg Oral Daily Sakai, Isami, DO       zinc sulfate capsule 220 mg  220 mg Oral Daily Sakai, Isami, DO       Objective: Vital signs in last 24 hours: Temp:  [97.7 F (36.5 C)-98.3 F (36.8 C)] 98.2 F (36.8 C) (10/04 0800) Pulse Rate:  [73-86] 73 (10/04 0800) Resp:  [17-22] 17 (10/04 0800) BP: (85-105)/(53-72) 94/60 (10/04 0800) SpO2:  [97 %-100 %] 100 % (10/04 0800)  Intake/Output from previous day: 10/03 0701 - 10/04 0700 In: 295 [IV Piggyback:295] Out: 1000 [Urine:1000] Intake/Output this shift: No intake/output data recorded.  Physical Exam Vitals and nursing note reviewed.  Constitutional:      General: She is not in acute distress.    Appearance: She is not ill-appearing, toxic-appearing or diaphoretic.  HENT:     Head: Normocephalic and atraumatic.  Pulmonary:     Effort: Pulmonary effort is normal. No respiratory distress.  Skin:    General: Skin is warm and dry.  Neurological:     Mental Status: She is alert.  Psychiatric:        Mood and Affect: Mood normal.        Behavior: Behavior normal.    Lab Results:  Recent Labs    08/30/22 1153 08/31/22 0530  WBC 7.8 6.1  HGB 11.4* 9.6*  HCT 36.7 31.0*  PLT 267 227   BMET Recent Labs    08/30/22 1153 08/31/22 0530  NA 131* 134*  K 5.0 5.0  CL 93* 101  CO2 26 28  GLUCOSE 264* 86  BUN 25* 22  CREATININE 1.02* 0.87  CALCIUM 8.9 8.5*   Studies/Results: CT Abdomen Pelvis W Contrast  Result Date: 08/30/2022 CLINICAL DATA:  Worsening abdominal pain. Follow-up abscess. Recent colostomy. Increased drainage from percutaneous drain. Colon cancer. * Tracking Code: BO * EXAM: CT ABDOMEN AND PELVIS  WITH CONTRAST TECHNIQUE: Multidetector CT imaging of the  abdomen and pelvis was performed using the standard protocol following bolus administration of intravenous contrast. RADIATION DOSE REDUCTION: This exam was performed according to the departmental dose-optimization program which includes automated exposure control, adjustment of the mA and/or kV according to patient size and/or use of iterative reconstruction technique. CONTRAST:  178m OMNIPAQUE IOHEXOL 300 MG/ML  SOLN COMPARISON:  08/16/2022 FINDINGS: Lower Chest: No significant change in small to moderate bilateral pleural effusions and dependent atelectasis. Hepatobiliary: No hepatic masses identified. Prior cholecystectomy. No evidence of biliary obstruction. Pancreas:  No mass or inflammatory changes. Spleen: Within normal limits in size and appearance. Adrenals/Urinary Tract: No suspicious masses identified. No evidence of ureteral calculi or hydronephrosis. Stomach/Bowel: Postop changes again seen from sigmoid colon resection with descending colostomy. Evidence of bowel obstruction. Percutaneous drainage catheter remains in place in the left pelvis. A small rim enhancing fluid collection is seen in the left pelvis adjacent to the catheter which measures 3.3 x 2.0 cm and is new since previous study. Several other loculated pelvic fluid collections have resolved since prior exam. A persistent gas and fluid collection is seen in the left iliopsoas region which measures 4.7 by 3.6 cm, without significant change since prior study. Vascular/Lymphatic: No pathologically enlarged lymph nodes. No acute vascular findings. Aortic atherosclerotic calcification incidentally noted. Reproductive: Prior hysterectomy noted. Adnexal regions are unremarkable in appearance. Other:  None. Musculoskeletal:  No suspicious bone lesions identified. IMPRESSION: Postop changes from sigmoid colectomy with descending colostomy. New 3.3 cm rim enhancing fluid collection in left pelvis adjacent to the pigtail drainage catheter. Stable 4.7  cm gas and fluid collection in left iliopsoas region. Stable small to moderate bilateral pleural effusions and dependent atelectasis. Aortic Atherosclerosis (ICD10-I70.0). Electronically Signed   By: JMarlaine HindM.D.   On: 08/30/2022 13:39    Assessment & Plan: 76year old female readmitted after a prolonged hospitalization with complicated perforated sigmoid diverticulitis with abdominal pelvic abscess drainage s/p 2 abdominal pelvic drain placements with IR and ex lap/Hartman's procedure who subsequently developed urinary retention.  She is clinically improving following Foley catheter replacement with a low drain output, however one of her drains does appear to be putting out clear, yellow urine consistent with urine.  Notably, it remains on suction drainage.  Recommendations: -Continue Foley catheter with plans for CT cystogram in 7 days -Take bladder JP off suction -Consider voiding trial in the future, timing TBD based on clinical course with unknown etiology of retention  SDebroah Loop PA-C 08/31/2022

## 2022-08-31 NOTE — ED Notes (Signed)
Informed RN bed assigned 

## 2022-08-31 NOTE — Progress Notes (Signed)
Subjective:  CC: Sarah [name] is a 76 y.o. female  Hospital stay day 1,   bladder fistula  HPI: No acute issues overnight.  ROS:  General: Denies weight loss, weight gain, fatigue, fevers, chills, and night sweats. Heart: Denies chest pain, palpitations, racing heart, irregular heartbeat, leg pain or swelling, and decreased activity tolerance. Respiratory: Denies breathing difficulty, shortness of breath, wheezing, cough, and sputum. GI: Denies change in appetite, heartburn, nausea, vomiting, constipation, diarrhea, and blood in stool. GU: Denies difficulty urinating, pain with urinating, urgency, frequency, blood in urine.   Objective:   Temp:  [97.7 F (36.5 C)-98.3 F (36.8 C)] 98.2 F (36.8 C) (10/04 0800) Pulse Rate:  [72-83] 74 (10/04 0941) Resp:  [17-22] 17 (10/04 0800) BP: (85-105)/(53-72) 91/56 (10/04 0941) SpO2:  [98 %-100 %] 99 % (10/04 0906)             Intake/Output this shift:   Intake/Output Summary (Last 24 hours) at 08/31/2022 1149 Last data filed at 08/31/2022 0700 Gross per 24 hour  Intake 295.03 ml  Output 1000 ml  Net -704.97 ml    Constitutional :  alert, cooperative, appears stated age, and no distress  Respiratory:  clear to auscultation bilaterally  Cardiovascular:  irregularly irregular rhythm  Gastrointestinal: soft, non-tender; bowel sounds normal; no masses,  no organomegaly. Midline incision wound open, still with scant drainage.  JP with urine like output from LLQ drain.  Feculant output still noted from LUQ drain  Skin: Cool and moist.   Psychiatric: Normal affect, non-agitated, not confused       LABS:     Latest Ref Rng & Units 08/31/2022    5:30 AM 08/30/2022   11:53 AM 08/15/2022    6:00 AM  CMP  Glucose 70 - 99 mg/dL 86  264  268   BUN 8 - 23 mg/dL '22  25  19   '$ Creatinine 0.44 - 1.00 mg/dL 0.87  1.02  0.65   Sodium 135 - 145 mmol/L 134  131  133   Potassium 3.5 - 5.1 mmol/L 5.0  5.0  3.7   Chloride 98 - 111 mmol/L 101  93  97    CO2 22 - 32 mmol/L '28  26  29   '$ Calcium 8.9 - 10.3 mg/dL 8.5  8.9  7.5   Total Protein 6.5 - 8.1 g/dL  7.2    Total Bilirubin 0.3 - 1.2 mg/dL  0.8    Alkaline Phos 38 - 126 U/L  94    AST 15 - 41 U/L  34    ALT 0 - 44 U/L  24        Latest Ref Rng & Units 08/31/2022    5:30 AM 08/30/2022   11:53 AM 08/15/2022    6:00 AM  CBC  WBC 4.0 - 10.5 K/uL 6.1  7.8  4.0   Hemoglobin 12.0 - 15.0 g/dL 9.6  11.4  9.1   Hematocrit 36.0 - 46.0 % 31.0  36.7  29.2   Platelets 150 - 400 K/uL 227  267  163     RADS: N/a Assessment:   Bladder fistula  Appreciate urology recs.  Will continue foley to decompress and allow fistula potentially to heal.  KEEP JP DRAINS OFF suction.  Labile BP, so will monitor for one more day.  Unsure if truly septic since no abdominal pain and cultures negative so far, but will continue abx to prevent any worsening.  PT eval in the meantime. Continue outside  meds and on regular diet   labs/images/medications/previous chart entries reviewed personally and relevant changes/updates noted above.

## 2022-08-31 NOTE — Evaluation (Signed)
Physical Therapy Evaluation Patient Details Name: Sarah [name] MRN: [id number] DOB: August 22, 1946 Today's Date: 08/31/2022  History of Present Illness  Pt is a 76 y.o. female with past medical history significant for CAD, A-fib, prolonged hospitalization with complicated perforated sigmoid diverticulitis with abdominal pelvic abscess drainage s/p 2 abdominal pelvic drain placements with IR and ex lap/Hartman's procedurewho presents to the emergency department with generalized weakness and increased output from her JP drain.  She has been at a facility who noted increased output from her JP drain.  Worsening generalized weakness and fatigue. MD assessment includes: Bladder perforation/fistula.   Clinical Impression  Pt confused and unable to provide reliable history during the session including being unsure what facility to was at prior to admission or if she was getting physical therapy there.  Pt also unsure of last time she walked but stated "It's been a while".  Pt required extensive encouragement to participate with PT services along with education on benefits of mobility.  Pt ultimately agreed to log roll training and attempted lateral scooting at the EOB but was unable to clear the surface of the mattress.  Pt declined to attempt standing with assistance despite encouragement.  Pt's BP in supine was 94/59 and in sitting 95/60 with HR WNL and no adverse symptoms reported. Pt will benefit from a trial of PT services in a SNF setting upon discharge to safely address deficits listed in patient problem list for decreased caregiver assistance and eventual return to PLOF.        Recommendations for follow up therapy are one component of a multi-disciplinary discharge planning process, led by the attending physician.  Recommendations may be updated based on patient status, additional functional criteria and insurance authorization.  Follow Up Recommendations Skilled nursing-short term rehab (<3  hours/day) Can patient physically be transported by private vehicle: No    Assistance Recommended at Discharge Frequent or constant Supervision/Assistance  Patient can return home with the following  A lot of help with bathing/dressing/bathroom;Help with stairs or ramp for entrance;Assist for transportation;Assistance with cooking/housework;Two people to help with walking and/or transfers    Equipment Recommendations Other (comment) (TBD at next venue of care)  Recommendations for Other Services       Functional Status Assessment Patient has had a recent decline in their functional status and/or demonstrates limited ability to make significant improvements in function in a reasonable and predictable amount of time     Precautions / Restrictions Precautions Precautions: Fall Precaution Comments: colostomy, 2 JP drains Restrictions Weight Bearing Restrictions: No      Mobility  Bed Mobility Overal bed mobility: Needs Assistance Bed Mobility: Rolling, Sidelying to Sit, Sit to Sidelying Rolling: Min assist Sidelying to sit: Mod assist     Sit to sidelying: Mod assist, +2 for physical assistance General bed mobility comments: Varying degrees of assistance per above along with cues for sequencing for log roll training    Transfers                   General transfer comment: Pt made multiple attempts to laterally scoot at the EOB but unable to clear the surface of the bed; declined to attempt sit to stand with assistance despite encouragement    Ambulation/Gait               General Gait Details: Pt declined to attempt  Stairs            Wheelchair Mobility    Modified Rankin (Stroke Patients Only)  Balance Overall balance assessment: Needs assistance Sitting-balance support: Feet supported, Single extremity supported Sitting balance-Leahy Scale: Fair         Standing balance comment: NT                              Pertinent Vitals/Pain Pain Assessment Pain Assessment: No/denies pain    Home Living Family/patient expects to be discharged to:: Private residence Living Arrangements: Alone Available Help at Discharge: Family;Available PRN/intermittently   Home Access: Ramped entrance         Home Equipment: Rolling Walker (2 wheels);Cane - single point Additional Comments: Pt is a poor historian, coming to acute care from SNF per chart review, prior living situation taken from chart from previous admission    Prior Function Prior Level of Function : Independent/Modified Independent             Mobility Comments: using single point cane, denies falls ADLs Comments: IND with ADLs/IADLs prior to recent hospitalizations     Hand Dominance   Dominant Hand: Right    Extremity/Trunk Assessment   Upper Extremity Assessment Upper Extremity Assessment: Generalized weakness    Lower Extremity Assessment Lower Extremity Assessment: Generalized weakness       Communication   Communication: No difficulties  Cognition Arousal/Alertness: Awake/alert Behavior During Therapy: Flat affect Overall Cognitive Status: No family/caregiver present to determine baseline cognitive functioning                                 General Comments: Pt unable to provide history; unsure where she is admitting to the hospital from or if she was receiving PT services prior to admission        General Comments      Exercises Total Joint Exercises Ankle Circles/Pumps: AROM, Strengthening, Both, 10 reps Quad Sets: Strengthening, Both, 10 reps Gluteal Sets: Strengthening, Both, 10 reps Other Exercises Other Exercises: Static sitting at EOB for core strengthening and increased activity tolerance x 6 min Other Exercises: HEP education for BLE APs, QS, and GS   Assessment/Plan    PT Assessment Patient needs continued PT services  PT Problem List Decreased strength;Decreased activity  tolerance;Decreased balance;Decreased mobility;Decreased knowledge of use of DME       PT Treatment Interventions DME instruction;Gait training;Functional mobility training;Therapeutic activities;Therapeutic exercise;Balance training;Patient/family education    PT Goals (Current goals can be found in the Care Plan section)  Acute Rehab PT Goals Patient Stated Goal: To get stronger PT Goal Formulation: With patient Time For Goal Achievement: 09/13/22 Potential to Achieve Goals: Fair    Frequency Min 2X/week     Co-evaluation               AM-PAC PT "6 Clicks" Mobility  Outcome Measure Help needed turning from your back to your side while in a flat bed without using bedrails?: A Lot Help needed moving from lying on your back to sitting on the side of a flat bed without using bedrails?: A Lot Help needed moving to and from a bed to a chair (including a wheelchair)?: A Lot Help needed standing up from a chair using your arms (e.g., wheelchair or bedside chair)?: A Lot Help needed to walk in hospital room?: Total Help needed climbing 3-5 steps with a railing? : Total 6 Click Score: 10    End of Session   Activity Tolerance: Patient tolerated treatment well  Patient left: in bed;with call bell/phone within reach;with bed alarm set Nurse Communication: Mobility status PT Visit Diagnosis: Muscle weakness (generalized) (M62.81);Difficulty in walking, not elsewhere classified (R26.2)    Time: 4917-9150 PT Time Calculation (min) (ACUTE ONLY): 23 min   Charges:   PT Evaluation $PT Eval Moderate Complexity: 1 Mod PT Treatments $Therapeutic Activity: 8-22 mins       D. Royetta Asal PT, DPT 08/31/22, 5:28 PM

## 2022-09-01 ENCOUNTER — Other Ambulatory Visit: Payer: Medicare Other

## 2022-09-01 DIAGNOSIS — N322 Vesical fistula, not elsewhere classified: Secondary | ICD-10-CM | POA: Diagnosis not present

## 2022-09-01 DIAGNOSIS — R339 Retention of urine, unspecified: Secondary | ICD-10-CM | POA: Diagnosis not present

## 2022-09-01 LAB — GLUCOSE, CAPILLARY
Glucose-Capillary: 78 mg/dL (ref 70–99)
Glucose-Capillary: 141 mg/dL — ABNORMAL HIGH (ref 70–99)
Glucose-Capillary: 192 mg/dL — ABNORMAL HIGH (ref 70–99)
Glucose-Capillary: 128 mg/dL — ABNORMAL HIGH (ref 70–99)

## 2022-09-01 LAB — CBC
HCT: 27.8 % — ABNORMAL LOW (ref 36.0–46.0)
Hemoglobin: 8.7 g/dL — ABNORMAL LOW (ref 12.0–15.0)
MCH: 30.3 pg (ref 26.0–34.0)
MCHC: 31.3 g/dL (ref 30.0–36.0)
MCV: 96.9 fL (ref 80.0–100.0)
Platelets: 181 10*3/uL (ref 150–400)
RBC: 2.87 MIL/uL — ABNORMAL LOW (ref 3.87–5.11)
RDW: 19.8 % — ABNORMAL HIGH (ref 11.5–15.5)
WBC: 5.1 10*3/uL (ref 4.0–10.5)
nRBC: 0 % (ref 0.0–0.2)

## 2022-09-01 LAB — BASIC METABOLIC PANEL
Anion gap: 4 — ABNORMAL LOW (ref 5–15)
BUN: 19 mg/dL (ref 8–23)
CO2: 27 mmol/L (ref 22–32)
Calcium: 8.5 mg/dL — ABNORMAL LOW (ref 8.9–10.3)
Chloride: 102 mmol/L (ref 98–111)
Creatinine, Ser: 0.76 mg/dL (ref 0.44–1.00)
GFR, Estimated: >60 mL/min (ref >60)
Glucose, Bld: 83 mg/dL (ref 70–99)
Potassium: 4.3 mmol/L (ref 3.5–5.1)
Sodium: 133 mmol/L — ABNORMAL LOW (ref 135–145)

## 2022-09-01 MED ORDER — IPRATROPIUM BROMIDE 0.02 % IN SOLN
0.5000 mg | RESPIRATORY_TRACT | Status: DC | PRN
  Administered 2022-09-02: 0.5 mg via RESPIRATORY_TRACT
  Filled 2022-09-01: qty 2.5

## 2022-09-01 NOTE — Care Management Important Message (Addendum)
Important Message  Patient Details  Name: Sarah [name] MRN: [id number] Date of Birth: 1946-10-31   Medicare Important Message Given: N/A - LOS <3 Initial given by admissions     Dannette Barbara 09/01/2022, 1:00 PM

## 2022-09-01 NOTE — NC FL2 (Signed)
[place] LEVEL OF CARE SCREENING TOOL     IDENTIFICATION  Patient Name: Sarah [name] Birthdate: [birth year] Sex: female Admission Date (Current Location): 08/30/2022  [facility] and [place] Number:  Engineering geologist and Address:  [facility], [address], [place],  [address]      Provider Number: [id number]  Attending Physician Name and Address:  Benjamine Sprague, DO  Relative Name and Phone Number:       Current Level of Care: Hospital Recommended Level of Care: [place] Prior Approval Number:    Date Approved/Denied:   PASRR Number: [id number]  Discharge Plan: SNF    Current Diagnoses: Patient Active Problem List   Diagnosis Date Noted   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   Goals of care, counseling/discussion 08/22/2022   Colon cancer ([place]) 08/22/2022   Hypoglycemia 08/12/2022   Hypotension 08/12/2022   Rectal bleeding 08/11/2022   AKI (acute kidney injury) ([place]) 08/11/2022   CAD (coronary artery disease) 76/19/5093   Chronic systolic CHF (congestive heart failure) ([place]) 08/11/2022   Postoperative intra-abdominal abscess 08/11/2022   Depression 08/11/2022   Paroxysmal atrial fibrillation ([place]) 08/11/2022   Hypothyroidism    Protein-calorie malnutrition, severe 07/23/2022   Pressure injury of skin 07/23/2022   NSTEMI (non-ST elevated myocardial infarction) ([place]) 26/71/2458   Acute systolic CHF (congestive heart failure) ([place]) 07/23/2022   Atrial fibrillation with RVR ([place]) 07/23/2022   Electrolyte abnormality including hyponatremia, hypokalemia, hypophosphatemia and hypomagnesemia 07/23/2022   Hyponatremia 07/23/2022   Uncontrolled type 2 diabetes mellitus with hyperglycemia, without long-term current use of insulin ([place]) 07/23/2022   Hypokalemia 07/23/2022   Hypomagnesemia 07/23/2022   Diverticulitis 07/19/2022   Diverticulitis large intestine 05/28/2022    Orientation RESPIRATION BLADDER Height &  Weight     Self, Place  Normal Continent, Indwelling catheter Weight:   Height:     BEHAVIORAL SYMPTOMS/MOOD NEUROLOGICAL BOWEL NUTRITION STATUS   (None)  (None) Continent, Ileostomy Diet (Carb modified)  AMBULATORY STATUS COMMUNICATION OF NEEDS Skin   Extensive Assist Verbally Surgical wounds, Other (Comment) (Unstageable pressure injury on right buttock: Foam daily. Incision on abdomen: Wet-to-dry daily. Wound on medial abdomen: Wet-to-dry.)                       Personal Care Assistance Level of Assistance  Bathing, Feeding, Dressing Bathing Assistance: Maximum assistance Feeding assistance: Limited assistance Dressing Assistance: Maximum assistance     Functional Limitations Info  Sight, Hearing, Speech Sight Info: Adequate Hearing Info: Adequate Speech Info: Adequate    SPECIAL CARE FACTORS FREQUENCY  PT (By licensed PT), OT (By licensed OT)     PT Frequency: 5 x week OT Frequency: 5 x week            Contractures Contractures Info: Not present    Additional Factors Info  Code Status, Allergies Code Status Info: DNR Allergies Info: Penicillins, Sulfa Antibiotics           Current Medications (09/01/2022):  This is the current hospital active medication list Current Facility-Administered Medications  Medication Dose Route Frequency Provider Last Rate Last Admin   acetaminophen (TYLENOL) tablet 650 mg  650 mg Oral Q4H PRN Lysle Pearl, Isami, DO       amiodarone (PACERONE) tablet 200 mg  200 mg Oral Daily Sakai, Isami, DO   200 mg at 09/01/22 0905   apixaban (ELIQUIS) tablet 2.5 mg  2.5 mg Oral BID Sakai, Isami, DO   2.5 mg at 09/01/22 [phone]  ascorbic acid (VITAMIN C) tablet 500 mg  500 mg Oral BID Lysle Pearl, Isami, DO   500 mg at 09/01/22 6270   aspirin chewable tablet 81 mg  81 mg Oral Daily Sakai, Isami, DO   81 mg at 09/01/22 0903   atorvastatin (LIPITOR) tablet 10 mg  10 mg Oral Daily Sakai, Isami, DO   10 mg at 09/01/22 3500   Chlorhexidine Gluconate Cloth 2 %  PADS 6 each  6 each Topical Daily Sakai, Isami, DO   6 each at 08/31/22 2100   cyanocobalamin (VITAMIN B12) tablet 1,000 mcg  1,000 mcg Oral Daily Sakai, Isami, DO   1,000 mcg at 09/01/22 9381   docusate sodium (COLACE) capsule 100 mg  100 mg Oral BID PRN Sakai, Isami, DO       feeding supplement (ENSURE ENLIVE / ENSURE PLUS) liquid 237 mL  237 mL Oral TID BM Sakai, Isami, DO   237 mL at 09/01/22 0907   HYDROcodone-acetaminophen (NORCO/VICODIN) 5-325 MG per tablet 1-2 tablet  1-2 tablet Oral Q4H PRN Lysle Pearl, Isami, DO   1 tablet at 09/01/22 0106   insulin aspart (novoLOG) injection 0-9 Units  0-9 Units Subcutaneous TID WC Sakai, Isami, DO   2 Units at 08/31/22 1249   ipratropium (ATROVENT) nebulizer solution 0.5 mg  0.5 mg Nebulization BID Renda Rolls, RPH   0.5 mg at 09/01/22 0841   ipratropium-albuterol (DUONEB) 0.5-2.5 (3) MG/3ML nebulizer solution 3 mL  3 mL Nebulization Q6H PRN Lysle Pearl, Isami, DO       lactated ringers infusion   Intravenous Continuous Sakai, Isami, DO 100 mL/hr at 09/01/22 1225 Infusion Verify at 09/01/22 1225   leptospermum manuka honey (MEDIHONEY) paste 1 Application  1 Application Topical Daily Sakai, Isami, DO   1 Application at 82/99/37 0903   levothyroxine (SYNTHROID) tablet 137 mcg  137 mcg Oral Q0600 Lysle Pearl, Isami, DO   137 mcg at 09/01/22 0517   lisinopril (ZESTRIL) tablet 2.5 mg  2.5 mg Oral Daily Sakai, Isami, DO       magnesium oxide (MAG-OX) tablet 400 mg  400 mg Oral Daily Sakai, Isami, DO   400 mg at 09/01/22 0903   metoprolol tartrate (LOPRESSOR) tablet 25 mg  25 mg Oral TID Lysle Pearl, Isami, DO   25 mg at 09/01/22 0904   morphine (PF) 2 MG/ML injection 2 mg  2 mg Intravenous Q4H PRN Lysle Pearl, Isami, DO   2 mg at 08/30/22 2310   multivitamin with minerals tablet 1 tablet  1 tablet Oral Daily Sakai, Isami, DO   1 tablet at 09/01/22 0904   ondansetron (ZOFRAN-ODT) disintegrating tablet 4 mg  4 mg Oral Q6H PRN Lysle Pearl, Isami, DO       Or   ondansetron [facility]) injection 4  mg  4 mg Intravenous Q6H PRN Lysle Pearl, Isami, DO   4 mg at 08/30/22 2310   Oral care mouth rinse  15 mL Mouth Rinse PRN Sakai, Isami, DO       potassium chloride SA (KLOR-CON M) CR tablet 20 mEq  20 mEq Oral BID Sakai, Isami, DO   20 mEq at 09/01/22 0903   sertraline (ZOLOFT) tablet 100 mg  100 mg Oral Daily Sakai, Isami, DO   100 mg at 09/01/22 0904   traMADol (ULTRAM) tablet 50 mg  50 mg Oral Q6H PRN Lysle Pearl, Isami, DO       venlafaxine [facility]) tablet 37.5 mg  37.5 mg Oral Daily Sakai, Isami, DO   37.5 mg at 09/01/22 [id number]  zinc sulfate capsule 220 mg  220 mg Oral Daily Sakai, Isami, DO   220 mg at 09/01/22 8592     Discharge Medications: Please see discharge summary for a list of discharge medications.  Relevant Imaging Results:  Relevant Lab Results:   Additional Information SS#: [id number]  Candie Chroman, LCSW

## 2022-09-01 NOTE — Evaluation (Signed)
Occupational Therapy Evaluation Patient Details Name: Sarah [name] MRN: [id number] DOB: 1946/10/22 Today's Date: 09/01/2022   History of Present Illness Pt is a 76 y.o. female with past medical history significant for CAD, A-fib, prolonged hospitalization with complicated perforated sigmoid diverticulitis with abdominal pelvic abscess drainage s/p 2 abdominal pelvic drain placements with IR and ex lap/Hartman's procedurewho presents to the emergency department with generalized weakness and increased output from her JP drain.  She has been at a facility who noted increased output from her JP drain.  Worsening generalized weakness and fatigue. MD assessment includes: Bladder perforation/fistula.   Clinical Impression   Sarah [name] was seen for OT evaluation this date. Prior to hospital admission, pt was MOD I prior to recent hospitalizations. Pt lives alone (spouse at facility currently). Pt presents to acute OT demonstrating impaired ADL performance and functional mobility 2/2 decreased activity tolerance and functional strength/ROM/balance deficits. Pt currently requires SUPERVISION rolling L+R with rail use and bridges without assist. Pt defers sitting citing dizziness, agreeable to bed level exercises, completes as described below. Pt would benefit from skilled OT to address noted impairments and functional limitations (see below for any additional details). Upon hospital discharge, recommend STR to maximize pt safety and return to PLOF.   Recommendations for follow up therapy are one component of a multi-disciplinary discharge planning process, led by the attending physician.  Recommendations may be updated based on patient status, additional functional criteria and insurance authorization.   Follow Up Recommendations  Skilled nursing-short term rehab (<3 hours/day)    Assistance Recommended at Discharge Frequent or constant Supervision/Assistance  Patient can return home with the following A lot of  help with walking and/or transfers;A lot of help with bathing/dressing/bathroom    Functional Status Assessment  Patient has had a recent decline in their functional status and demonstrates the ability to make significant improvements in function in a reasonable and predictable amount of time.  Equipment Recommendations  Other (comment) (defer)    Recommendations for Other Services       Precautions / Restrictions Precautions Precautions: Fall Precaution Comments: colostomy, 2 JP drains Restrictions Weight Bearing Restrictions: No      Mobility Bed Mobility Overal bed mobility: Needs Assistance Bed Mobility: Rolling Rolling: Supervision         General bed mobility comments: bed rail use to boost self up in bed    Transfers                   General transfer comment: pt defers citing dizziness and fatigue          ADL either performed or assessed with clinical judgement   ADL Overall ADL's : Needs assistance/impaired                                       General ADL Comments: SUPERVISION bed level toilet t/f - bridges without assist.      Pertinent Vitals/Pain Pain Assessment Pain Assessment: Faces Faces Pain Scale: Hurts little more Pain Location: abdomen Pain Descriptors / Indicators: Guarding, Grimacing Pain Intervention(s): Limited activity within patient's tolerance, Repositioned     Hand Dominance Right   Extremity/Trunk Assessment Upper Extremity Assessment Upper Extremity Assessment: Generalized weakness   Lower Extremity Assessment Lower Extremity Assessment: Generalized weakness       Communication Communication Communication: No difficulties   Cognition Arousal/Alertness: Awake/alert Behavior During Therapy: Flat affect Overall Cognitive Status:  No family/caregiver present to determine baseline cognitive functioning                                       General Comments       Exercises  Exercises: General Lower Extremity General Exercises - Lower Extremity Gluteal Sets: AROM, Strengthening, Both, 15 reps, Supine Short Arc Quad: AROM, Strengthening, Both, 15 reps, Supine Straight Leg Raises: AROM, Strengthening, Both, 15 reps, Supine Hip Flexion/Marching: AROM, Strengthening, Both, 15 reps, Supine Other Exercises Other Exercises: HEP education. x10 bridges bed level   Shoulder Instructions      Home Living Family/patient expects to be discharged to:: Private residence Living Arrangements: Alone Available Help at Discharge: Family;Available PRN/intermittently   Home Access: Ramped entrance                     Home Equipment: Rolling Walker (2 wheels);Cane - single point   Additional Comments: setup per chart      Prior Functioning/Environment Prior Level of Function : Independent/Modified Independent             Mobility Comments: using single point cane, denies falls ADLs Comments: IND with ADLs/IADLs prior to recent hospitalizations        OT Problem List: Decreased strength;Decreased range of motion;Decreased activity tolerance;Impaired balance (sitting and/or standing);Decreased safety awareness;Pain      OT Treatment/Interventions: Therapeutic exercise;Self-care/ADL training;Energy conservation;DME and/or AE instruction;Therapeutic activities;Patient/family education;Balance training;Cognitive remediation/compensation    OT Goals(Current goals can be found in the care plan section) Acute Rehab OT Goals Patient Stated Goal: to go to rehab OT Goal Formulation: With patient Time For Goal Achievement: 09/15/22 Potential to Achieve Goals: Fair ADL Goals Pt Will Perform Grooming: with set-up;with supervision;sitting Pt Will Perform Lower Body Dressing: with min assist;sitting/lateral leans Pt Will Transfer to Toilet: with mod assist;bedside commode;stand pivot transfer  OT Frequency: Min 2X/week    Co-evaluation              AM-PAC  OT "6 Clicks" Daily Activity     Outcome Measure Help from another person eating meals?: None Help from another person taking care of personal grooming?: A Little Help from another person toileting, which includes using toliet, bedpan, or urinal?: A Lot Help from another person bathing (including washing, rinsing, drying)?: A Lot Help from another person to put on and taking off regular upper body clothing?: A Little Help from another person to put on and taking off regular lower body clothing?: A Lot 6 Click Score: 16   End of Session    Activity Tolerance: Patient limited by fatigue Patient left: in bed;with call bell/phone within reach;with bed alarm set  OT Visit Diagnosis: Other abnormalities of gait and mobility (R26.89);Muscle weakness (generalized) (M62.81)                Time: 0254-2706 OT Time Calculation (min): 14 min Charges:  OT General Charges $OT Visit: 1 Visit OT Evaluation $OT Eval Low Complexity: 1 Low  Dessie Coma, M.S. OTR/L  09/01/22, 2:51 PM  ascom [phone]

## 2022-09-01 NOTE — Consult Note (Addendum)
[facility] Nurse Consult Note: Reason for Consult: Consult requested for right buttock and abd wound. Wound type: Right buttock with Unstageable presure injury; 100% yellow slough, mod amt tan drainage, 7X5cm Abd with full thickness wound from previous surgery, 90% red, 10% black at edge where previous umbilicus was located. Mod amt tan drainage, 9X5X.2cm.  Pressure Injury POA: Yes Dressing procedure/placement/frequency: Topical treatment orders provided for bedside nurses to perform as follows to assist with removal of nonviable tissue and promote moist healing:  1. Wet to dry gauze to midline wound Q day.   2. Apply Medihoney to right buttock wound, top with gauze dressing and secure with silicone bordered foam. Apply thin layer (3 mm) to wound.  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 Stoma is red and viable when visualized through pouch, appears to be flush with skin level.  Pt had surgery performed 8/22 and is in a SNF where she receives total assistance with pouching activities.  Using flat one piece pouch.  Left extra barrier ring and flat one piece pouch at the bedside for staff nurse use.   Please re-consult if further assistance is needed.  Thank-you,  Julien Girt MSN, [place], [place], [place], [place]

## 2022-09-01 NOTE — TOC Initial Note (Signed)
Transition of Care [facility]) - Initial/Assessment Note    Patient Details  Name: Sarah [name] MRN: [id number] Date of Birth: March 04, 1946  Transition of Care [facility]) CM/SW Contact:    Beverly Sessions, RN Phone Number: 09/01/2022, 2:22 PM  Clinical Narrative:                  Admitted for: bladder fistula  Admitted from: [facility] STR PCP: miller  Patient A&O x2.  Spoke with sister Judson Roch on phone and confirmed plan for patient to return to [facility] for rehab.  Patient's husband is currently a patient in the Hospice home.  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   Goals of care, counseling/discussion 08/22/2022   Colon cancer ([place]) 08/22/2022   Hypoglycemia 08/12/2022   Hypotension 08/12/2022   Rectal bleeding 08/11/2022   AKI (acute kidney injury) ([place]) 08/11/2022   CAD (coronary artery disease) 31/49/7026   Chronic systolic CHF (congestive heart failure) ([place]) 08/11/2022   Postoperative intra-abdominal abscess 08/11/2022   Depression 08/11/2022   Paroxysmal atrial fibrillation ([place]) 08/11/2022   Hypothyroidism    Protein-calorie malnutrition, severe 07/23/2022   Pressure injury of skin 07/23/2022   NSTEMI (non-ST elevated myocardial infarction) ([place]) 37/85/8850   Acute systolic CHF (congestive heart failure) ([place]) 07/23/2022   Atrial fibrillation with RVR ([place]) 07/23/2022   Electrolyte abnormality including hyponatremia,  hypokalemia, hypophosphatemia and hypomagnesemia 07/23/2022   Hyponatremia 07/23/2022   Uncontrolled type 2 diabetes mellitus with hyperglycemia, without long-term current use of insulin ([place]) 07/23/2022   Hypokalemia 07/23/2022   Hypomagnesemia 07/23/2022   Diverticulitis 07/19/2022   Diverticulitis large intestine 05/28/2022   PCP:  Rusty Aus, MD Pharmacy:   [facility] [address] ([address]), [place]) [place] [address] Phone: [phone] Fax: [phone]     Social Determinants of Health (SDOH) Interventions    Readmission Risk Interventions    08/14/2022    3:26 PM 07/28/2022    3:27 PM  Readmission Risk Prevention Plan  Transportation Screening Complete Complete  PCP or Specialist Appt within 3-5 Days  Complete  HRI or [place]  Complete  Social Work Consult for [place] Planning/Counseling  Complete  Palliative Care Screening  Complete  Medication Review Press photographer) Complete Complete  PCP or Specialist appointment within 3-5 days of discharge Complete   HRI or Home Care Consult Complete   SW Recovery Care/Counseling Consult Complete   Palliative Care Screening Not  [place] Not Applicable

## 2022-09-01 NOTE — Progress Notes (Signed)
Subjective:  CC: Sarah [name] is a 76 y.o. female  Hospital stay day 2,   bladder fistula  HPI: No issues overnight.  BP starting to improve very slightly.  Has remained asymptomatic throughout.  ROS:  General: Denies weight loss, weight gain, fatigue, fevers, chills, and night sweats. Heart: Denies chest pain, palpitations, racing heart, irregular heartbeat, leg pain or swelling, and decreased activity tolerance. Respiratory: Denies breathing difficulty, shortness of breath, wheezing, cough, and sputum. GI: Denies change in appetite, heartburn, nausea, vomiting, constipation, diarrhea, and blood in stool. GU: Denies difficulty urinating, pain with urinating, urgency, frequency, blood in urine.   Objective:   Temp:  [97.5 F (36.4 C)-97.9 F (36.6 C)] 97.9 F (36.6 C) (10/05 1459) Pulse Rate:  [76-80] 79 (10/05 1459) Resp:  [14-18] 14 (10/05 1459) BP: (88-104)/(54-65) 91/63 (10/05 1459) SpO2:  [93 %-100 %] 99 % (10/05 1459)             Intake/Output this shift:   Intake/Output Summary (Last 24 hours) at 09/01/2022 1509 Last data filed at 09/01/2022 1225 Gross per 24 hour  Intake 2679.56 ml  Output 1845 ml  Net 834.56 ml    Constitutional :  alert, cooperative, appears stated age, and no distress  Respiratory:  clear to auscultation bilaterally  Cardiovascular:  regular rate and rhythm  Gastrointestinal: soft, non-tender; bowel sounds normal; no masses,  no organomegaly. Ostomy productive, midline wound dressing intact. JP with scant urine output and purulent output off suction  Skin: Cool and moist. Sacral wound with some exudative buildup but no sign of infection, non tender  Psychiatric: Normal affect, non-agitated, not confused       LABS:     Latest Ref Rng & Units 09/01/2022    5:16 AM 08/31/2022    5:30 AM 08/30/2022   11:53 AM  CMP  Glucose 70 - 99 mg/dL 83  86  264   BUN 8 - 23 mg/dL '19  22  25   '$ Creatinine 0.44 - 1.00 mg/dL 0.76  0.87  1.02   Sodium 135 - 145  mmol/L 133  134  131   Potassium 3.5 - 5.1 mmol/L 4.3  5.0  5.0   Chloride 98 - 111 mmol/L 102  101  93   CO2 22 - 32 mmol/L '27  28  26   '$ Calcium 8.9 - 10.3 mg/dL 8.5  8.5  8.9   Total Protein 6.5 - 8.1 g/dL   7.2   Total Bilirubin 0.3 - 1.2 mg/dL   0.8   Alkaline Phos 38 - 126 U/L   94   AST 15 - 41 U/L   34   ALT 0 - 44 U/L   24       Latest Ref Rng & Units 09/01/2022    5:16 AM 08/31/2022    5:30 AM 08/30/2022   11:53 AM  CBC  WBC 4.0 - 10.5 K/uL 5.1  6.1  7.8   Hemoglobin 12.0 - 15.0 g/dL 8.7  9.6  11.4   Hematocrit 36.0 - 46.0 % 27.8  31.0  36.7   Platelets 150 - 400 K/uL 181  227  267     RADS: N/a Assessment:   Bladder fistula   Appreciate urology recs.  Will continue foley to decompress and allow fistula potentially to heal.  KEEP JP DRAINS OFF suction.   Labile BP, slight improvement today after discontinuing spirinolactone and lasix.  Will discontinue lisinopril as well to see if any better.  No sign of fluid retention today.  Continuing amiodorane and metoprolol.   Stop abx now since no sign of continued leak, developing infection.   Pending authorization from SNF to return back. Continue to monitor in the meantime.  PT eval and treat continuing.   labs/images/medications/previous chart entries reviewed personally and relevant changes/updates noted above.  labs/images/medications/previous chart entries reviewed personally and relevant changes/updates noted above.

## 2022-09-01 NOTE — Progress Notes (Signed)
Urology Inpatient Progress Note  Subjective: No acute events overnight.  She remains afebrile and BP is low normal. WBC count down today, 5.1.  Creatinine down, 0.76.  Blood cultures pending with no growth at 2 days.  On antibiotics as below. Foley catheter in place draining clear, yellow urine.  2 abdominal JP drains in place with scant output, clear yellow output in her bladder drain and cloudy pink/brown output in her abdominal drain.  Drains are off bulb suction with a total of 30 mL of output between them yesterday. She states she feels well today and denies abdominal pain.  Anti-infectives: Anti-infectives (From admission, onward)    Start     Dose/Rate Route Frequency Ordered Stop   08/30/22 2215  ciprofloxacin (CIPRO) IVPB 400 mg        400 mg 200 mL/hr over 60 Minutes Intravenous Every 12 hours 08/30/22 2214 09/06/22 2214   08/30/22 2215  metroNIDAZOLE (FLAGYL) IVPB 500 mg        500 mg 100 mL/hr over 60 Minutes Intravenous Every 12 hours 08/30/22 2214 09/06/22 2214   08/30/22 1530  ceFEPIme (MAXIPIME) 2 g in sodium chloride 0.9 % 100 mL IVPB        2 g 200 mL/hr over 30 Minutes Intravenous  Once 08/30/22 1515 08/30/22 1647   08/30/22 1530  metroNIDAZOLE (FLAGYL) IVPB 500 mg        500 mg 100 mL/hr over 60 Minutes Intravenous  Once 08/30/22 1515 08/30/22 1812       Current Facility-Administered Medications  Medication Dose Route Frequency Provider Last Rate Last Admin   acetaminophen (TYLENOL) tablet 650 mg  650 mg Oral Q4H PRN Lysle Pearl, Isami, DO       amiodarone (PACERONE) tablet 200 mg  200 mg Oral Daily Sakai, Isami, DO   200 mg at 08/31/22 0943   apixaban (ELIQUIS) tablet 2.5 mg  2.5 mg Oral BID Sakai, Isami, DO   2.5 mg at 08/31/22 2137   ascorbic acid (VITAMIN C) tablet 500 mg  500 mg Oral BID Sakai, Isami, DO   500 mg at 08/31/22 2137   aspirin chewable tablet 81 mg  81 mg Oral Daily Sakai, Isami, DO   81 mg at 08/31/22 0943   atorvastatin (LIPITOR) tablet 10 mg  10  mg Oral Daily Sakai, Isami, DO   10 mg at 08/31/22 0944   Chlorhexidine Gluconate Cloth 2 % PADS 6 each  6 each Topical Daily Sakai, Isami, DO   6 each at 08/31/22 2100   ciprofloxacin (CIPRO) IVPB 400 mg  400 mg Intravenous Q12H Sakai, Isami, DO   Stopped at 08/31/22 2236   cyanocobalamin (VITAMIN B12) tablet 1,000 mcg  1,000 mcg Oral Daily Sakai, Isami, DO   1,000 mcg at 08/31/22 0944   docusate sodium (COLACE) capsule 100 mg  100 mg Oral BID PRN Sakai, Isami, DO       feeding supplement (ENSURE ENLIVE / ENSURE PLUS) liquid 237 mL  237 mL Oral TID BM Sakai, Isami, DO   237 mL at 08/31/22 1327   HYDROcodone-acetaminophen (NORCO/VICODIN) 5-325 MG per tablet 1-2 tablet  1-2 tablet Oral Q4H PRN Lysle Pearl, Isami, DO   1 tablet at 09/01/22 0106   insulin aspart (novoLOG) injection 0-9 Units  0-9 Units Subcutaneous TID WC Sakai, Isami, DO   2 Units at 08/31/22 1249   ipratropium (ATROVENT) nebulizer solution 0.5 mg  0.5 mg Nebulization BID Renda Rolls, RPH   0.5 mg at 09/01/22 [phone]  ipratropium-albuterol (DUONEB) 0.5-2.5 (3) MG/3ML nebulizer solution 3 mL  3 mL Nebulization Q6H PRN Lysle Pearl, Isami, DO       lactated ringers infusion   Intravenous Continuous Sakai, Isami, DO 100 mL/hr at 09/01/22 0345 New Bag at 09/01/22 0345   leptospermum manuka honey (MEDIHONEY) paste 1 Application  1 Application Topical Daily Sakai, Isami, DO   1 Application at 21/19/41 1327   levothyroxine (SYNTHROID) tablet 137 mcg  137 mcg Oral Q0600 Sakai, Isami, DO   137 mcg at 09/01/22 0517   lisinopril (ZESTRIL) tablet 2.5 mg  2.5 mg Oral Daily Sakai, Isami, DO       magnesium oxide (MAG-OX) tablet 400 mg  400 mg Oral Daily Sakai, Isami, DO   400 mg at 08/31/22 0944   metoprolol tartrate (LOPRESSOR) tablet 25 mg  25 mg Oral TID Benjamine Sprague, DO   25 mg at 08/31/22 2137   metroNIDAZOLE (FLAGYL) IVPB 500 mg  500 mg Intravenous Q12H Sakai, Isami, DO   Stopped at 08/31/22 2343   morphine (PF) 2 MG/ML injection 2 mg  2 mg Intravenous  Q4H PRN Lysle Pearl, Isami, DO   2 mg at 08/30/22 2310   multivitamin with minerals tablet 1 tablet  1 tablet Oral Daily Sakai, Isami, DO   1 tablet at 08/31/22 0944   ondansetron (ZOFRAN-ODT) disintegrating tablet 4 mg  4 mg Oral Q6H PRN Lysle Pearl, Isami, DO       Or   ondansetron [facility]) injection 4 mg  4 mg Intravenous Q6H PRN Lysle Pearl, Isami, DO   4 mg at 08/30/22 2310   Oral care mouth rinse  15 mL Mouth Rinse PRN Sakai, Isami, DO       potassium chloride SA (KLOR-CON M) CR tablet 20 mEq  20 mEq Oral BID Sakai, Isami, DO   20 mEq at 08/31/22 2137   sertraline (ZOLOFT) tablet 100 mg  100 mg Oral Daily Sakai, Isami, DO   100 mg at 08/31/22 0943   traMADol (ULTRAM) tablet 50 mg  50 mg Oral Q6H PRN Lysle Pearl, Isami, DO       venlafaxine [facility]) tablet 37.5 mg  37.5 mg Oral Daily Sakai, Isami, DO   37.5 mg at 08/31/22 0945   zinc sulfate capsule 220 mg  220 mg Oral Daily Sakai, Isami, DO   220 mg at 08/31/22 0944   Objective: Vital signs in last 24 hours: Temp:  [97.5 F (36.4 C)-97.9 F (36.6 C)] 97.5 F (36.4 C) (10/05 0744) Pulse Rate:  [72-80] 78 (10/05 0744) Resp:  [16-18] 18 (10/05 0744) BP: (84-104)/(43-65) 104/65 (10/05 0744) SpO2:  [93 %-100 %] 100 % (10/05 0841)  Intake/Output from previous day: 10/04 0701 - 10/05 0700 In: 1946.6 [P.O.:360; I.V.:986.6; IV Piggyback:600] Out: 7408 [Urine:1800; Drains:30] Intake/Output this shift: No intake/output data recorded.  Physical Exam Vitals and nursing note reviewed.  Constitutional:      General: She is not in acute distress.    Appearance: She is not ill-appearing, toxic-appearing or diaphoretic.  HENT:     Head: Normocephalic and atraumatic.  Pulmonary:     Effort: Pulmonary effort is normal. No respiratory distress.  Skin:    General: Skin is warm and dry.  Neurological:     Mental Status: She is alert.  Psychiatric:        Mood and Affect: Mood normal.        Behavior: Behavior normal.    Lab Results:  Recent Labs     08/31/22 0530 09/01/22 0516  WBC 6.1 5.1  HGB 9.6* 8.7*  HCT 31.0* 27.8*  PLT 227 181   BMET Recent Labs    08/31/22 0530 09/01/22 0516  NA 134* 133*  K 5.0 4.3  CL 101 102  CO2 28 27  GLUCOSE 86 83  BUN 22 19  CREATININE 0.87 0.76  CALCIUM 8.5* 8.5*   Studies/Results: CT Abdomen Pelvis W Contrast  Result Date: 08/30/2022 CLINICAL DATA:  Worsening abdominal pain. Follow-up abscess. Recent colostomy. Increased drainage from percutaneous drain. Colon cancer. * Tracking Code: BO * EXAM: CT ABDOMEN AND PELVIS WITH CONTRAST TECHNIQUE: Multidetector CT imaging of the abdomen and pelvis was performed using the standard protocol following bolus administration of intravenous contrast. RADIATION DOSE REDUCTION: This exam was performed according to the departmental dose-optimization program which includes automated exposure control, adjustment of the mA and/or kV according to patient size and/or use of iterative reconstruction technique. CONTRAST:  136m OMNIPAQUE IOHEXOL 300 MG/ML  SOLN COMPARISON:  08/16/2022 FINDINGS: Lower Chest: No significant change in small to moderate bilateral pleural effusions and dependent atelectasis. Hepatobiliary: No hepatic masses identified. Prior cholecystectomy. No evidence of biliary obstruction. Pancreas:  No mass or inflammatory changes. Spleen: Within normal limits in size and appearance. Adrenals/Urinary Tract: No suspicious masses identified. No evidence of ureteral calculi or hydronephrosis. Stomach/Bowel: Postop changes again seen from sigmoid colon resection with descending colostomy. Evidence of bowel obstruction. Percutaneous drainage catheter remains in place in the left pelvis. A small rim enhancing fluid collection is seen in the left pelvis adjacent to the catheter which measures 3.3 x 2.0 cm and is new since previous study. Several other loculated pelvic fluid collections have resolved since prior exam. A persistent gas and fluid collection is seen in  the left iliopsoas region which measures 4.7 by 3.6 cm, without significant change since prior study. Vascular/Lymphatic: No pathologically enlarged lymph nodes. No acute vascular findings. Aortic atherosclerotic calcification incidentally noted. Reproductive: Prior hysterectomy noted. Adnexal regions are unremarkable in appearance. Other:  None. Musculoskeletal:  No suspicious bone lesions identified. IMPRESSION: Postop changes from sigmoid colectomy with descending colostomy. New 3.3 cm rim enhancing fluid collection in left pelvis adjacent to the pigtail drainage catheter. Stable 4.7 cm gas and fluid collection in left iliopsoas region. Stable small to moderate bilateral pleural effusions and dependent atelectasis. Aortic Atherosclerosis (ICD10-I70.0). Electronically Signed   By: JMarlaine HindM.D.   On: 08/30/2022 13:39    Assessment & Plan: 76year old female readmitted after a prolonged hospitalization with complicated perforated sigmoid diverticulitis with abdominal pelvic abscess drainage s/p 2 abdominal pelvic drain placements with IR and ex lap/Hartman's procedure who subsequently developed urinary retention.  She continues to clinically improved today.  Scant JP drain output with bulbs off suction drainage.  Recommendations: -Continue Foley catheter with plans for CT cystogram next week, urology coordinating with imaging to arrange -Keep bladder JP off suction -Consider voiding trial in the future, timing TBD based on clinical course  SDebroah Loop PA-C 09/01/2022

## 2022-09-02 LAB — CBC
HCT: 29.1 % — ABNORMAL LOW (ref 36.0–46.0)
Hemoglobin: 9.1 g/dL — ABNORMAL LOW (ref 12.0–15.0)
MCH: 30.5 pg (ref 26.0–34.0)
MCHC: 31.3 g/dL (ref 30.0–36.0)
MCV: 97.7 fL (ref 80.0–100.0)
Platelets: 207 10*3/uL (ref 150–400)
RBC: 2.98 MIL/uL — ABNORMAL LOW (ref 3.87–5.11)
RDW: 19.9 % — ABNORMAL HIGH (ref 11.5–15.5)
WBC: 5.3 10*3/uL (ref 4.0–10.5)
nRBC: 0 % (ref 0.0–0.2)

## 2022-09-02 LAB — BASIC METABOLIC PANEL
Anion gap: 4 — ABNORMAL LOW (ref 5–15)
BUN: 21 mg/dL (ref 8–23)
CO2: 29 mmol/L (ref 22–32)
Calcium: 8.7 mg/dL — ABNORMAL LOW (ref 8.9–10.3)
Chloride: 98 mmol/L (ref 98–111)
Creatinine, Ser: 0.7 mg/dL (ref 0.44–1.00)
GFR, Estimated: >60 mL/min (ref >60)
Glucose, Bld: 239 mg/dL — ABNORMAL HIGH (ref 70–99)
Potassium: 4.4 mmol/L (ref 3.5–5.1)
Sodium: 131 mmol/L — ABNORMAL LOW (ref 135–145)

## 2022-09-02 LAB — GLUCOSE, CAPILLARY
Glucose-Capillary: 239 mg/dL — ABNORMAL HIGH (ref 70–99)
Glucose-Capillary: 186 mg/dL — ABNORMAL HIGH (ref 70–99)
Glucose-Capillary: 143 mg/dL — ABNORMAL HIGH (ref 70–99)
Glucose-Capillary: 70 mg/dL (ref 70–99)
Glucose-Capillary: 69 mg/dL — ABNORMAL LOW (ref 70–99)
Glucose-Capillary: 92 mg/dL (ref 70–99)

## 2022-09-02 MED ORDER — DOCUSATE SODIUM 100 MG PO CAPS: 100.0000 mg | ORAL_CAPSULE | Freq: Two times a day (BID) | ORAL | 0 refills | Status: AC | PRN

## 2022-09-02 NOTE — TOC Progression Note (Addendum)
Transition of Care [facility]) - Progression Note    Patient Details  Name: Sarah [name] MRN: [id number] Date of Birth: [birth year]  Transition of Care [facility]) CM/SW [place], LCSW Phone Number: 09/02/2022, 9:26 AM  Clinical Narrative:  Josem Kaufmann still pending.   1:42 pm: Auth still pending.  4:14 pm: Auth still pending. [facility] is aware and will follow up with weekend [facility] team.  Expected Discharge Plan and Services                                                 Social Determinants of Health (SDOH) Interventions    Readmission Risk Interventions    08/14/2022    3:26 PM 07/28/2022    3:27 PM  Readmission Risk Prevention Plan  Transportation Screening Complete Complete  PCP or Specialist Appt within 3-5 Days  Complete  HRI or [place]  Complete  Social Work Consult for [place] Planning/Counseling  Complete  Palliative Care Screening  Complete  Medication Review Press photographer) Complete Complete  PCP or Specialist appointment within 3-5 days of discharge Complete   HRI or [place] Complete   SW Recovery Care/Counseling Consult Complete   [place] Not Applicable

## 2022-09-02 NOTE — Discharge Instructions (Signed)
KEEP JP DRAINS OFF SUCTION.  CONTINUE WET TO DRY DRESSING CHANGES TO MIDLINE  OSTOMY CARE AS NEEDED  FOLEY TO REMAIN IN PLACE UNTIL SEEN BY UROLOGY. DO NO MANIPULATE.  CALL UROLOGY OFFICE WITH ANY ISSUES.  RADIOLOGY SHOULD NOT REMOVE DRAIN NEXT TO BLADDER UNTIL DISCUSSING WITH UROLOGY.  THEY CAN REMOVE THE UPPER DRAIN IF FOLLOWUP CT SCAN DOES NOT SHOW ANY EVIDENCE OF PERSISTENT ABSCESS

## 2022-09-02 NOTE — Progress Notes (Signed)
PT Cancellation Note  Patient Details Name: Sarah [name] MRN: [id number] DOB: Apr 04, 1946   Cancelled Treatment:    Reason Eval/Treat Not Completed: Patient declined, no reason specified (Attempt x 2, patient vehemently declined participating in physical therapy.)   Janna Arch, PT, DPT  09/02/2022, 9:35 AM

## 2022-09-03 DIAGNOSIS — N322 Vesical fistula, not elsewhere classified: Secondary | ICD-10-CM | POA: Diagnosis not present

## 2022-09-03 LAB — BASIC METABOLIC PANEL
Anion gap: 7 (ref 5–15)
BUN: 28 mg/dL — ABNORMAL HIGH (ref 8–23)
CO2: 25 mmol/L (ref 22–32)
Calcium: 9.5 mg/dL (ref 8.9–10.3)
Chloride: 101 mmol/L (ref 98–111)
Creatinine, Ser: 0.71 mg/dL (ref 0.44–1.00)
GFR, Estimated: >60 mL/min (ref >60)
Glucose, Bld: 164 mg/dL — ABNORMAL HIGH (ref 70–99)
Potassium: 5.4 mmol/L — ABNORMAL HIGH (ref 3.5–5.1)
Sodium: 133 mmol/L — ABNORMAL LOW (ref 135–145)

## 2022-09-03 LAB — GLUCOSE, CAPILLARY
Glucose-Capillary: 164 mg/dL — ABNORMAL HIGH (ref 70–99)
Glucose-Capillary: 159 mg/dL — ABNORMAL HIGH (ref 70–99)
Glucose-Capillary: 155 mg/dL — ABNORMAL HIGH (ref 70–99)
Glucose-Capillary: 139 mg/dL — ABNORMAL HIGH (ref 70–99)
Glucose-Capillary: 98 mg/dL (ref 70–99)

## 2022-09-03 NOTE — Progress Notes (Signed)
09/03/2022  Subjective: No acute events overnight.  Patient reports that she has been doing well and denies any worsening abdominal pain.  Drains remain in place and Foley catheter is in place as well.  Currently pending insurance authorization for SNF transfer.  Vital signs: Temp:  [97.5 F (36.4 C)-98.1 F (36.7 C)] 98.1 F (36.7 C) (10/07 0803) Pulse Rate:  [75-83] 83 (10/07 0803) Resp:  [18-20] 18 (10/07 0803) BP: (99-121)/(59-73) 121/73 (10/07 0803) SpO2:  [97 %-100 %] 98 % (10/07 0803)   Intake/Output: 10/06 0701 - 10/07 0700 In: 10  Out: 825 [Urine:800; Drains:25] Last BM Date : 09/02/22  Physical Exam: Constitutional: No acute distress Abdomen: Soft, nondistended, nontender to palpation.  Left lower quadrant ostomy working well with stool in the bag.  Stoma pink and patent.  Midline incision with wet-to-dry dressing, no evidence of infection.  Drains with purulent output.  Labs:  Recent Labs    09/01/22 0516 09/02/22 0500  WBC 5.1 5.3  HGB 8.7* 9.1*  HCT 27.8* 29.1*  PLT 181 207   Recent Labs    09/02/22 0500 09/03/22 0558  NA 131* 133*  K 4.4 5.4*  CL 98 101  CO2 29 25  GLUCOSE 239* 164*  BUN 21 28*  CREATININE 0.70 0.71  CALCIUM 8.7* 9.5   No results for input(s): "LABPROT", "INR" in the last 72 hours.  Imaging: No results found.  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  - Continue Foley catheter in place for decompression to allow the fistula to potentially heal.  Both drains will remain off suction and discussed this with patient's RN today. - Patient's potassium is elevated today to 5.4.  Supplements have been discontinued. - Continue diet as tolerated. - Currently pending insurance authorization for SNF.  Will discharge once approved.   I spent 35 minutes dedicated to the care of this patient on the date of this encounter to include pre-visit review of records, face-to-face time with the patient discussing diagnosis  and management, and any post-visit coordination of care.  Melvyn Neth, [facility]

## 2022-09-03 NOTE — TOC Progression Note (Signed)
Transition of Care [facility]) - Progression Note    Patient Details  Name: SWEET JARVIS MRN: [id number] Date of Birth: [birth year]  Transition of Care [facility]) CM/SW Contact  [address], [place], [place] Phone Number: 09/03/2022, 11:47 AM  Clinical Narrative:     Follow up call from [facility] to check status of patient's discharge. If patient is to admit there today, should would need to arrive before 1pm. Candace Cruise checked-Auth still pending.  [facility] notified.  Arnesha Schiraldi, [place] Worker  [facility] Care Management [phone]     Expected Discharge Plan and Services        Expected Discharge Plan and Services                                                 Social Determinants of Health (SDOH) Interventions    Readmission Risk Interventions    08/14/2022    3:26 PM 07/28/2022    3:27 PM  Readmission Risk Prevention Plan  Transportation Screening Complete Complete  PCP or Specialist Appt within 3-5 Days  Complete  HRI or [place]  Complete  Social Work Consult for [place] Planning/Counseling  Complete  Palliative Care Screening  Complete  Medication Review Press photographer) Complete Complete  PCP or Specialist appointment within 3-5 days of discharge Complete   HRI or [place] Complete   SW Recovery Care/Counseling Consult Complete   [place] Not Applicable

## 2022-09-03 NOTE — Plan of Care (Signed)
  Problem: Education: Goal: Ability to describe self-care measures that may prevent or decrease complications (Diabetes Survival Skills Education) will improve Outcome: Progressing Goal: Individualized Educational Video(s) Outcome: Progressing   Problem: Coping: Goal: Ability to adjust to condition or change in health will improve Outcome: Progressing   

## 2022-09-04 DIAGNOSIS — N322 Vesical fistula, not elsewhere classified: Secondary | ICD-10-CM | POA: Diagnosis not present

## 2022-09-04 LAB — CULTURE, BLOOD (ROUTINE X 2)
Culture: NO GROWTH
Culture: NO GROWTH
Special Requests: ADEQUATE

## 2022-09-04 LAB — URINALYSIS, ROUTINE W REFLEX MICROSCOPIC
Bacteria, UA: NONE SEEN
Bilirubin Urine: NEGATIVE
Glucose, UA: NEGATIVE mg/dL
Ketones, ur: NEGATIVE mg/dL
Nitrite: NEGATIVE
Protein, ur: 100 mg/dL — AB
RBC / HPF: >50 RBC/hpf — ABNORMAL HIGH (ref 0–5)
Specific Gravity, Urine: 1.019 (ref 1.005–1.030)
pH: 5 (ref 5.0–8.0)

## 2022-09-04 LAB — BASIC METABOLIC PANEL
Anion gap: 6 (ref 5–15)
BUN: 33 mg/dL — ABNORMAL HIGH (ref 8–23)
CO2: 24 mmol/L (ref 22–32)
Calcium: 9.4 mg/dL (ref 8.9–10.3)
Chloride: 101 mmol/L (ref 98–111)
Creatinine, Ser: 0.68 mg/dL (ref 0.44–1.00)
GFR, Estimated: >60 mL/min (ref >60)
Glucose, Bld: 97 mg/dL (ref 70–99)
Potassium: 5.5 mmol/L — ABNORMAL HIGH (ref 3.5–5.1)
Sodium: 131 mmol/L — ABNORMAL LOW (ref 135–145)

## 2022-09-04 LAB — GLUCOSE, CAPILLARY
Glucose-Capillary: 91 mg/dL (ref 70–99)
Glucose-Capillary: 89 mg/dL (ref 70–99)
Glucose-Capillary: 83 mg/dL (ref 70–99)
Glucose-Capillary: 85 mg/dL (ref 70–99)

## 2022-09-04 MED ORDER — CIPROFLOXACIN HCL 500 MG PO TABS
500.0000 mg | ORAL_TABLET | Freq: Two times a day (BID) | ORAL | Status: DC
  Administered 2022-09-04 – 2022-09-05 (×4): 500 mg via ORAL
  Filled 2022-09-04 (×4): qty 1

## 2022-09-04 NOTE — Consult Note (Signed)
Pharmacy Antibiotic Note  Sarah [name] is a 76 y.o. female admitted on 08/30/2022 with  bladder fistula .  General surgery admission. Urology consulted. Pharmacy has been consulted for ciprofloxacin dosing.  Antimicrobials were initially stopped due to no s/sx of infection and no sign of continued leak. However, now with hematuria. Urinalysis pending - possible infection. UA with reflex ordered  Plan:  Ciprofloxacin 500 mg BID  Temp (24hrs), Avg:98 F (36.7 C), Min:97.6 F (36.4 C), Max:98.7 F (37.1 C)  Recent Labs  Lab 08/30/22 1153 08/30/22 1155 08/30/22 1420 08/31/22 0530 09/01/22 0516 09/02/22 0500 09/03/22 0558 09/04/22 0502  WBC 7.8  --   --  6.1 5.1 5.3  --   --   CREATININE 1.02*  --   --  0.87 0.76 0.70 0.71 0.68  LATICACIDVEN  --  2.5* 2.7*  --   --   --   --   --     Estimated Creatinine Clearance: 49.5 mL/min (by C-G formula based on SCr of 0.68 mg/dL).    Allergies  Allergen Reactions   Penicillins Anaphylaxis    TOLERATED CEFOTETAN 06/2022   Sulfa Antibiotics Rash    Antimicrobials this admission: Cefepime 10/3 x 1 Ciprofloxacin 10/3 >> 10/5, 10/8 >> Metronidazole 10/3 >> 10/5  Dose adjustments this admission: N/A  Microbiology results: 10/3 BCx: NG - final  Thank you for allowing pharmacy to be a part of this patient's care.  Benita Gutter 09/04/2022 11:13 AM

## 2022-09-04 NOTE — Plan of Care (Signed)
  Problem: Activity: Goal: Risk for activity intolerance will decrease Outcome: Not Progressing   Problem: Skin Integrity: Goal: Risk for impaired skin integrity will decrease Outcome: Progressing   Problem: Nutritional: Goal: Maintenance of adequate nutrition will improve Outcome: Not Progressing

## 2022-09-04 NOTE — Progress Notes (Signed)
09/04/2022  Subjective: No acute events.  Still pending insurance authorization.  Denies any worsening pain.  K stable at 5.5.  She's having some light blood in her urine.  Vital signs: Temp:  [97.6 F (36.4 C)-98.7 F (37.1 C)] 97.6 F (36.4 C) (10/08 0804) Pulse Rate:  [65-79] 67 (10/08 0804) Resp:  [14-18] 14 (10/08 0804) BP: (98-109)/(55-69) 108/65 (10/08 0804) SpO2:  [96 %-100 %] 100 % (10/08 0804)   Intake/Output: 10/07 0701 - 10/08 0700 In: 180 [P.O.:180] Out: 470 [Urine:440; Drains:30] Last BM Date : 09/03/22  Physical Exam: Constitutional:  No acute distress Abdomen:  soft, non-distended, non-tender.  Drains with seropurulent fluid.  Midline incision clean with healthy tissue edges and wound base.  Wet to dry applied. GU:  foley with some pink urine and sediments from blood.  No frank blood.  Labs:  Recent Labs    09/02/22 0500  WBC 5.3  HGB 9.1*  HCT 29.1*  PLT 207   Recent Labs    09/03/22 0558 09/04/22 0502  NA 133* 131*  K 5.4* 5.5*  CL 101 101  CO2 25 24  GLUCOSE 164* 97  BUN 28* 33*  CREATININE 0.71 0.68  CALCIUM 9.5 9.4   No results for input(s): "LABPROT", "INR" in the last 72 hours.  Imaging: No results found.  Assessment/Plan: This is a 76 y.o. female with intra-abdominal abscess and bladder fistula, s/p hartmann's for diverticulitis and colon cancer.  --Patient doing well, without any significant pain and tolerating diet. --Patient is having some blood in her urine, pink color, no frank blood.  Discussed with Dr. Erlene Quan with Urology.  Will order U/A and culture and also resume her Cipro for now. --pending insurance authorization for SNF discharge.   I spent 35 minutes dedicated to the care of this patient on the date of this encounter to include pre-visit review of records, face-to-face time with the patient discussing diagnosis and management, and any post-visit coordination of care.  Melvyn Neth, [facility]

## 2022-09-04 NOTE — TOC Progression Note (Addendum)
Transition of Care [facility]) - Progression Note    Patient Details  Name: Sarah [name] MRN: [id number] Date of Birth: Jan 09, 1946  Transition of Care [facility]) CM/SW [place], LCSW Phone Number: 09/04/2022, 10:19 AM  Clinical Narrative:    SNF insurance Josem Kaufmann is still pending in [place] portal.  There is also a TOC consult stating "Need to confirm if she can return with foley and open wound, along with JP drains". CSW asked [name] with [facility] who said "Yes" patient can return with this.       Expected Discharge Plan and Services                                                 Social Determinants of Health (SDOH) Interventions    Readmission Risk Interventions    08/14/2022    3:26 PM 07/28/2022    3:27 PM  Readmission Risk Prevention Plan  Transportation Screening Complete Complete  PCP or Specialist Appt within 3-5 Days  Complete  HRI or [place]  Complete  Social Work Consult for [place] Planning/Counseling  Complete  Palliative Care Screening  Complete  Medication Review Press photographer) Complete Complete  PCP or Specialist appointment within 3-5 days of discharge Complete   HRI or [place] Complete   SW Recovery Care/Counseling Consult Complete   [place] Not Applicable

## 2022-09-05 LAB — GLUCOSE, CAPILLARY
Glucose-Capillary: 152 mg/dL — ABNORMAL HIGH (ref 70–99)
Glucose-Capillary: 160 mg/dL — ABNORMAL HIGH (ref 70–99)
Glucose-Capillary: 186 mg/dL — ABNORMAL HIGH (ref 70–99)
Glucose-Capillary: 165 mg/dL — ABNORMAL HIGH (ref 70–99)
Glucose-Capillary: 132 mg/dL — ABNORMAL HIGH (ref 70–99)

## 2022-09-05 LAB — BASIC METABOLIC PANEL
Anion gap: 8 (ref 5–15)
BUN: 36 mg/dL — ABNORMAL HIGH (ref 8–23)
CO2: 26 mmol/L (ref 22–32)
Calcium: 9.5 mg/dL (ref 8.9–10.3)
Chloride: 98 mmol/L (ref 98–111)
Creatinine, Ser: 0.82 mg/dL (ref 0.44–1.00)
GFR, Estimated: >60 mL/min (ref >60)
Glucose, Bld: 174 mg/dL — ABNORMAL HIGH (ref 70–99)
Potassium: 5.1 mmol/L (ref 3.5–5.1)
Sodium: 132 mmol/L — ABNORMAL LOW (ref 135–145)

## 2022-09-05 MED ORDER — CIPROFLOXACIN HCL 500 MG PO TABS: 500.0000 mg | ORAL_TABLET | Freq: Two times a day (BID) | ORAL | 0 refills | Status: DC

## 2022-09-05 NOTE — Progress Notes (Signed)
Physical Therapy Treatment Patient Details Name: Sarah [name] MRN: [id number] DOB: [birth year] Today's Date: 09/05/2022   History of Present Illness Pt is a 76 y.o. female with past medical history significant for CAD, A-fib, prolonged hospitalization with complicated perforated sigmoid diverticulitis with abdominal pelvic abscess drainage s/p 2 abdominal pelvic drain placements with IR and ex lap/Hartman's procedurewho presents to the emergency department with generalized weakness and increased output from her JP drain.  She has been at a facility who noted increased output from her JP drain.  Worsening generalized weakness and fatigue. MD assessment includes: Bladder perforation/fistula.    PT Comments    Pt is making very little progress. Willing to attempt supine there-ex this date, however still unwilling to attempt sitting at EOB or functional mobility training. Heavy education given about benefits of mobility, with pt still declining. Will try to progress.   Recommendations for follow up therapy are one component of a multi-disciplinary discharge planning process, led by the attending physician.  Recommendations may be updated based on patient status, additional functional criteria and insurance authorization.  Follow Up Recommendations  Skilled nursing-short term rehab (<3 hours/day) Can patient physically be transported by private vehicle: No   Assistance Recommended at Discharge Frequent or constant Supervision/Assistance  Patient can return home with the following A lot of help with bathing/dressing/bathroom;Help with stairs or ramp for entrance;Assist for transportation;Assistance with cooking/housework;Two people to help with walking and/or transfers   Equipment Recommendations  Other (comment)    Recommendations for Other Services       Precautions / Restrictions Precautions Precautions: Fall Restrictions Weight Bearing Restrictions: No     Mobility  Bed Mobility Overal  bed mobility: Needs Assistance Bed Mobility: Rolling Rolling: Min guard         General bed mobility comments: uses bed rail to initiate, however cga for getting completely into sidelying. Max assist for repositioning in bed including scooting towards top. Asked several ways to perform OOB mobility with flat refusal each time.    Transfers                   General transfer comment: unwilling to attempt    Ambulation/Gait                   Stairs             Wheelchair Mobility    Modified Rankin (Stroke Patients Only)       Balance                                            Cognition Arousal/Alertness: Awake/alert Behavior During Therapy: Flat affect Overall Cognitive Status: Within Functional Limits for tasks assessed                                 General Comments: firm in her decisions to not perform mobility        Exercises Other Exercises Other Exercises: Educated on HEP with pt performing 8 reps of SLRs, heel slides, toe presses, and quad sets. Fatigues rapidly with each set with cues for frequency and duration throughout day    General Comments        Pertinent Vitals/Pain Pain Assessment Pain Assessment: No/denies pain    Home Living  Prior Function            PT Goals (current goals can now be found in the care plan section) Acute Rehab PT Goals Patient Stated Goal: To get stronger PT Goal Formulation: With patient Time For Goal Achievement: 09/13/22 Potential to Achieve Goals: Fair Progress towards PT goals: Progressing toward goals    Frequency    Min 2X/week      PT Plan Current plan remains appropriate    Co-evaluation              AM-PAC PT "6 Clicks" Mobility   Outcome Measure  Help needed turning from your back to your side while in a flat bed without using bedrails?: A Lot Help needed moving from lying on your back to  sitting on the side of a flat bed without using bedrails?: A Lot Help needed moving to and from a bed to a chair (including a wheelchair)?: Total Help needed standing up from a chair using your arms (e.g., wheelchair or bedside chair)?: Total Help needed to walk in hospital room?: Total Help needed climbing 3-5 steps with a railing? : Total 6 Click Score: 8    End of Session   Activity Tolerance: Patient limited by fatigue Patient left: in bed;with call bell/phone within reach;with bed alarm set Nurse Communication: Mobility status PT Visit Diagnosis: Muscle weakness (generalized) (M62.81);Difficulty in walking, not elsewhere classified (R26.2)     Time: 1683-7290 PT Time Calculation (min) (ACUTE ONLY): 11 min  Charges:  $Therapeutic Exercise: 8-22 mins                     Sarah [name], PT, DPT, GCS [phone]    Sarah [name] 09/05/2022, 10:30 AM

## 2022-09-05 NOTE — TOC Progression Note (Signed)
Transition of Care [facility]) - Progression Note    Patient Details  Name: Sarah [name] MRN: [id number] Date of Birth: September 11, 1946  Transition of Care [facility]) CM/SW Contact  Beverly Sessions, RN Phone Number: 09/05/2022, 9:23 AM  Clinical Narrative:      [facility] rep for update  They are requesting Peer to peer Phone Number is [phone] Option 5  to by completed 130 pm Dr Lysle Pearl aware.  Sister Judson Roch updated       Expected Discharge Plan and Services                                                 Social Determinants of Health (SDOH) Interventions    Readmission Risk Interventions    08/14/2022    3:26 PM 07/28/2022    3:27 PM  Readmission Risk Prevention Plan  Transportation Screening Complete Complete  PCP or Specialist Appt within 3-5 Days  Complete  HRI or [place]  Complete  Social Work Consult for [place] Planning/Counseling  Complete  Palliative Care Screening  Complete  Medication Review Press photographer) Complete Complete  PCP or Specialist appointment within 3-5 days of discharge Complete   HRI or [place] Complete   SW Recovery Care/Counseling Consult Complete   [place] Not Applicable

## 2022-09-05 NOTE — TOC Progression Note (Signed)
Transition of Care [facility]) - Progression Note    Patient Details  Name: Sarah [name] MRN: [id number] Date of Birth: Jan 12, 1946  Transition of Care [facility]) CM/SW Contact  Beverly Sessions, RN Phone Number: 09/05/2022, 1:06 PM  Clinical Narrative:     MD has completed Peer to Peer and I received notification from [employer] that Josem Kaufmann is denied.  Notified sister Judson Roch and offered her the Appeal information.  She declines to appeal at this time and would like to move forward with private [facility] at [facility] to coordinate with family and to let me know when patient cant return.  MD updated        Expected Discharge Plan and Services                                                 Social Determinants of Health (SDOH) Interventions    Readmission Risk Interventions    08/14/2022    3:26 PM 07/28/2022    3:27 PM  Readmission Risk Prevention Plan  Transportation Screening Complete Complete  PCP or Specialist Appt within 3-5 Days  Complete  HRI or [place]  Complete  Social Work Consult for [place] Planning/Counseling  Complete  Palliative Care Screening  Complete  Medication Review Press photographer) Complete Complete  PCP or Specialist appointment within 3-5 days of discharge Complete   HRI or [place] Complete   SW Recovery Care/Counseling Consult Complete   [place] Not Applicable

## 2022-09-05 NOTE — Progress Notes (Signed)
Occupational Therapy Treatment Patient Details Name: ROBEN TATSCH MRN: [id number] DOB: 1946/08/06 Today's Date: 09/05/2022   History of present illness Pt is a 76 y.o. female with past medical history significant for CAD, A-fib, prolonged hospitalization with complicated perforated sigmoid diverticulitis with abdominal pelvic abscess drainage s/p 2 abdominal pelvic drain placements with IR and ex lap/Hartman's procedurewho presents to the emergency department with generalized weakness and increased output from her JP drain.  She has been at a facility who noted increased output from her JP drain.  Worsening generalized weakness and fatigue. MD assessment includes: Bladder perforation/fistula.   OT comments  Ms Zwart was seen for OT treatment on this date. Upon arrival to room pt sidelying in bed with staff participating in bed bath, pt agreeable to tx. Pt requires MOD A sup>sit, tolerates ~2 min sitting, assist for balance and cues to offload pressure to bottom with anterior weight shifting. Pt limited by sacral pain 7/10 this session. Discussed HEP with good recall of LAQ technique to complete 5 reps in sitting. Pt making progress toward goals, will continue to follow POC. Discharge recommendation remains appropriate.     Recommendations for follow up therapy are one component of a multi-disciplinary discharge planning process, led by the attending physician.  Recommendations may be updated based on patient status, additional functional criteria and insurance authorization.    Follow Up Recommendations  Skilled nursing-short term rehab (<3 hours/day)    Assistance Recommended at Discharge Frequent or constant Supervision/Assistance  Patient can return home with the following  A lot of help with walking and/or transfers;A lot of help with bathing/dressing/bathroom   Equipment Recommendations  Other (comment) (defer)    Recommendations for Other Services      Precautions / Restrictions  Precautions Precautions: Fall Restrictions Weight Bearing Restrictions: No       Mobility Bed Mobility Overal bed mobility: Needs Assistance Bed Mobility: Rolling, Supine to Sit, Sit to Supine Rolling: Min guard   Supine to sit: Mod assist Sit to supine: Min assist   General bed mobility comments: assist for trunk elevation    Transfers                   General transfer comment: pt defers citing pain     Balance Overall balance assessment: Needs assistance Sitting-balance support: Feet supported, Bilateral upper extremity supported Sitting balance-Leahy Scale: Fair Sitting balance - Comments: poor balalnce with increased pain                                   ADL either performed or assessed with clinical judgement   ADL Overall ADL's : Needs assistance/impaired                                       General ADL Comments: MIN A seated simulated grooming tasks, assist for balance and cues to offload pressure to bottom with anterior weight shifting. MOD A bathing bed level.      Cognition Arousal/Alertness: Awake/alert Behavior During Therapy: Flat affect Overall Cognitive Status: Within Functional Limits for tasks assessed                                 General Comments: confirms fearful of pain and dizziness  Exercises Other Exercises Other Exercises: Demonstrated good recall of HEP for LAQ sets in sitting     Pertinent Vitals/ Pain       Pain Assessment Pain Assessment: 0-10 Pain Score: 7  Pain Location: buttocks Pain Descriptors / Indicators: Guarding, Grimacing Pain Intervention(s): Limited activity within patient's tolerance, Repositioned   Frequency  Min 2X/week        Progress Toward Goals  OT Goals(current goals can now be found in the care plan section)  Progress towards OT goals: Progressing toward goals  Acute Rehab OT Goals Patient Stated Goal: to go to rehab OT Goal  Formulation: With patient Time For Goal Achievement: 09/15/22 Potential to Achieve Goals: Fair ADL Goals Pt Will Perform Grooming: with set-up;with supervision;sitting Pt Will Perform Lower Body Dressing: with min assist;sitting/lateral leans Pt Will Transfer to Toilet: with mod assist;bedside commode;stand pivot transfer  Plan Discharge plan remains appropriate;Frequency remains appropriate    Co-evaluation                 AM-PAC OT "6 Clicks" Daily Activity     Outcome Measure   Help from another person eating meals?: None Help from another person taking care of personal grooming?: A Little Help from another person toileting, which includes using toliet, bedpan, or urinal?: A Lot Help from another person bathing (including washing, rinsing, drying)?: A Lot Help from another person to put on and taking off regular upper body clothing?: A Little Help from another person to put on and taking off regular lower body clothing?: A Lot 6 Click Score: 16    End of Session    OT Visit Diagnosis: Other abnormalities of gait and mobility (R26.89);Muscle weakness (generalized) (M62.81)   Activity Tolerance Patient limited by pain;Patient tolerated treatment well   Patient Left in bed;with call bell/phone within reach   Nurse Communication          Time: 1023-1035 OT Time Calculation (min): 12 min  Charges: OT General Charges $OT Visit: 1 Visit OT Treatments $Self Care/Home Management : 8-22 mins  Dessie Coma, M.S. OTR/L  09/05/22, 10:58 AM  ascom [phone]

## 2022-09-05 NOTE — TOC Progression Note (Signed)
Transition of Care [facility]) - Progression Note    Patient Details  Name: Sarah [name] MRN: [id number] Date of Birth: [birth year]  [facility]) CM/SW Contact  Beverly Sessions, RN Phone Number: 09/05/2022, 4:29 PM  Clinical Narrative:    [facility] they can [facility] patient private pay tomorrow at 11 or after.  MD updated  Will require EMS transport. I have notified MD that if she doesn't have a signed DNR on Chart she will need one         Expected Discharge Plan and Services                                                 Social Determinants of Health (SDOH) Interventions    Readmission Risk Interventions    08/14/2022    3:26 PM 07/28/2022    3:27 PM  Readmission Risk Prevention Plan  Transportation Screening Complete Complete  PCP or Specialist Appt within 3-5 Days  Complete  HRI or [place]  Complete  Social Work Consult for [place] Planning/Counseling  Complete  Palliative Care Screening  Complete  Medication Review Press photographer) Complete Complete  PCP or Specialist appointment within 3-5 days of discharge Complete   HRI or [place] Complete   SW Recovery Care/Counseling Consult Complete   [place] Not Applicable

## 2022-09-05 NOTE — Progress Notes (Signed)
Subjective:  CC: Sarah [name] is a 76 y.o. female  Hospital stay day 6,   bladder fistula  HPI: No issues overnight.  Having difficulty participating in rehab. Low appetite  ROS:  General: Denies weight loss, weight gain, fatigue, fevers, chills, and night sweats. Heart: Denies chest pain, palpitations, racing heart, irregular heartbeat, leg pain or swelling, and decreased activity tolerance. Respiratory: Denies breathing difficulty, shortness of breath, wheezing, cough, and sputum. GI: Denies change in appetite, heartburn, nausea, vomiting, constipation, diarrhea, and blood in stool. GU: Denies difficulty urinating, pain with urinating, urgency, frequency, blood in urine.   Objective:   Temp:  [97 F (36.1 C)-97.8 F (36.6 C)] 97.8 F (36.6 C) (10/09 0728) Pulse Rate:  [69-75] 72 (10/09 1548) Resp:  [14-20] 14 (10/09 1548) BP: (93-112)/(54-70) 107/69 (10/09 1548) SpO2:  [95 %-100 %] 95 % (10/09 1548)             Intake/Output this shift:   Intake/Output Summary (Last 24 hours) at 09/05/2022 1952 Last data filed at 09/05/2022 1623 Gross per 24 hour  Intake 700 ml  Output 488 ml  Net 212 ml    Constitutional :  alert, cooperative, appears stated age, and no distress  Respiratory:  clear to auscultation bilaterally  Cardiovascular:  regular rate and rhythm  Gastrointestinal: soft, non-tender; bowel sounds normal; no masses,  no organomegaly. Ostomy productive, midline wound dressing intact. JP with scant urine output and purulent output off suction  Skin: Cool and moist. Sacral wound with some exudative buildup but no sign of infection, non tender  Psychiatric: Normal affect, non-agitated, not confused       LABS:     Latest Ref Rng & Units 09/05/2022    4:56 AM 09/04/2022    5:02 AM 09/03/2022    5:58 AM  CMP  Glucose 70 - 99 mg/dL 174  97  164   BUN 8 - 23 mg/dL 36  33  28   Creatinine 0.44 - 1.00 mg/dL 0.82  0.68  0.71   Sodium 135 - 145 mmol/L 132  131  133    Potassium 3.5 - 5.1 mmol/L 5.1  5.5  5.4   Chloride 98 - 111 mmol/L 98  101  101   CO2 22 - 32 mmol/L '26  24  25   '$ Calcium 8.9 - 10.3 mg/dL 9.5  9.4  9.5       Latest Ref Rng & Units 09/02/2022    5:00 AM 09/01/2022    5:16 AM 08/31/2022    5:30 AM  CBC  WBC 4.0 - 10.5 K/uL 5.3  5.1  6.1   Hemoglobin 12.0 - 15.0 g/dL 9.1  8.7  9.6   Hematocrit 36.0 - 46.0 % 29.1  27.8  31.0   Platelets 150 - 400 K/uL 207  181  227     RADS: N/a Assessment:   Bladder fistula   Appreciate urology recs.  Will continue foley to decompress and allow fistula potentially to heal.  KEEP JP DRAINS OFF suction.    Pending authorization from SNF to return back. Continue to monitor in the meantime.  PT eval and treat continuing.   labs/images/medications/previous chart entries reviewed personally and relevant changes/updates noted above.  labs/images/medications/previous chart entries reviewed personally and relevant changes/updates noted above.

## 2022-09-06 DIAGNOSIS — R7989 Other specified abnormal findings of blood chemistry: Secondary | ICD-10-CM

## 2022-09-06 LAB — COMPREHENSIVE METABOLIC PANEL
ALT: 442 U/L — ABNORMAL HIGH (ref 0–44)
AST: 786 U/L — ABNORMAL HIGH (ref 15–41)
Albumin: 2.5 g/dL — ABNORMAL LOW (ref 3.5–5.0)
Alkaline Phosphatase: 212 U/L — ABNORMAL HIGH (ref 38–126)
Anion gap: 8 (ref 5–15)
BUN: 37 mg/dL — ABNORMAL HIGH (ref 8–23)
CO2: 23 mmol/L (ref 22–32)
Calcium: 9.2 mg/dL (ref 8.9–10.3)
Chloride: 101 mmol/L (ref 98–111)
Creatinine, Ser: 0.72 mg/dL (ref 0.44–1.00)
GFR, Estimated: >60 mL/min (ref >60)
Glucose, Bld: 116 mg/dL — ABNORMAL HIGH (ref 70–99)
Potassium: 4.8 mmol/L (ref 3.5–5.1)
Sodium: 132 mmol/L — ABNORMAL LOW (ref 135–145)
Total Bilirubin: 0.9 mg/dL (ref 0.3–1.2)
Total Protein: 6.2 g/dL — ABNORMAL LOW (ref 6.5–8.1)

## 2022-09-06 LAB — HEPATITIS PANEL, ACUTE
HCV Ab: NONREACTIVE
Hep A IgM: NONREACTIVE
Hep B C IgM: NONREACTIVE
Hepatitis B Surface Ag: NONREACTIVE

## 2022-09-06 LAB — CBC
HCT: 31.9 % — ABNORMAL LOW (ref 36.0–46.0)
Hemoglobin: 9.9 g/dL — ABNORMAL LOW (ref 12.0–15.0)
MCH: 30.6 pg (ref 26.0–34.0)
MCHC: 31 g/dL (ref 30.0–36.0)
MCV: 98.5 fL (ref 80.0–100.0)
Platelets: 208 10*3/uL (ref 150–400)
RBC: 3.24 MIL/uL — ABNORMAL LOW (ref 3.87–5.11)
RDW: 21.2 % — ABNORMAL HIGH (ref 11.5–15.5)
WBC: 6.7 10*3/uL (ref 4.0–10.5)
nRBC: 0 % (ref 0.0–0.2)

## 2022-09-06 LAB — GLUCOSE, CAPILLARY
Glucose-Capillary: 111 mg/dL — ABNORMAL HIGH (ref 70–99)
Glucose-Capillary: 173 mg/dL — ABNORMAL HIGH (ref 70–99)
Glucose-Capillary: 182 mg/dL — ABNORMAL HIGH (ref 70–99)
Glucose-Capillary: 111 mg/dL — ABNORMAL HIGH (ref 70–99)

## 2022-09-06 NOTE — TOC Progression Note (Addendum)
Transition of Care [facility]) - Progression Note    Patient Details  Name: Sarah [name] MRN: [id number] Date of Birth: Dec 12, 1945  Transition of Care [facility]) CM/SW Contact  Beverly Sessions, RN Phone Number: 09/06/2022, 10:02 AM  Clinical Narrative:     Per MD medically not ready for discharge today.  [facility] notified  MD to update sister       Expected Discharge Plan and Services                                                 Social Determinants of Health (SDOH) Interventions    Readmission Risk Interventions    08/14/2022    3:26 PM 07/28/2022    3:27 PM  Readmission Risk Prevention Plan  Transportation Screening Complete Complete  PCP or Specialist Appt within 3-5 Days  Complete  HRI or [place]  Complete  Social Work Consult for [place] Planning/Counseling  Complete  Palliative Care Screening  Complete  Medication Review Press photographer) Complete Complete  PCP or Specialist appointment within 3-5 days of discharge Complete   HRI or [place] Complete   SW Recovery Care/Counseling Consult Complete   [place] Not Applicable

## 2022-09-06 NOTE — Progress Notes (Signed)
Subjective:  CC: Sarah [name] is a 76 y.o. female  Hospital stay day 7,   bladder fistula  HPI: No issues overnight.  Tired and was sleeping in bed when physician entered room.  ROS:  General: Denies weight loss, weight gain, fatigue, fevers, chills, and night sweats. Heart: Denies chest pain, palpitations, racing heart, irregular heartbeat, leg pain or swelling, and decreased activity tolerance. Respiratory: Denies breathing difficulty, shortness of breath, wheezing, cough, and sputum. GI: Denies change in appetite, heartburn, nausea, vomiting, constipation, diarrhea, and blood in stool. GU: Denies difficulty urinating, pain with urinating, urgency, frequency, blood in urine.   Objective:   Temp:  [98.1 F (36.7 C)-98.3 F (36.8 C)] 98.3 F (36.8 C) (10/10 1533) Pulse Rate:  [67-72] 71 (10/10 1533) Resp:  [16-18] 18 (10/10 1533) BP: (95-112)/(61-64) 101/63 (10/10 1533) SpO2:  [97 %-100 %] 97 % (10/10 1533)             Intake/Output this shift:   Intake/Output Summary (Last 24 hours) at 09/06/2022 1655 Last data filed at 09/06/2022 0955 Gross per 24 hour  Intake 297 ml  Output 400 ml  Net -103 ml    Constitutional :  alert, cooperative, appears stated age, and no distress  Respiratory:  clear to auscultation bilaterally  Cardiovascular:  regular rate and rhythm  Gastrointestinal: soft, non-tender; bowel sounds normal; no masses,  no organomegaly. Ostomy productive, midline wound dressing intact. JP with scant urine output and purulent output off suction  Skin: Cool and moist. Sacral wound with some exudative buildup but no sign of infection, non tender  Psychiatric: Normal affect, non-agitated, not confused       LABS:     Latest Ref Rng & Units 09/06/2022    4:38 AM 09/05/2022    4:56 AM 09/04/2022    5:02 AM  CMP  Glucose 70 - 99 mg/dL 116  174  97   BUN 8 - 23 mg/dL 37  36  33   Creatinine 0.44 - 1.00 mg/dL 0.72  0.82  0.68   Sodium 135 - 145 mmol/L 132  132  131    Potassium 3.5 - 5.1 mmol/L 4.8  5.1  5.5   Chloride 98 - 111 mmol/L 101  98  101   CO2 22 - 32 mmol/L '23  26  24   '$ Calcium 8.9 - 10.3 mg/dL 9.2  9.5  9.4   Total Protein 6.5 - 8.1 g/dL 6.2     Total Bilirubin 0.3 - 1.2 mg/dL 0.9     Alkaline Phos 38 - 126 U/L 212     AST 15 - 41 U/L 786     ALT 0 - 44 U/L 442         Latest Ref Rng & Units 09/06/2022    4:38 AM 09/02/2022    5:00 AM 09/01/2022    5:16 AM  CBC  WBC 4.0 - 10.5 K/uL 6.7  5.3  5.1   Hemoglobin 12.0 - 15.0 g/dL 9.9  9.1  8.7   Hematocrit 36.0 - 46.0 % 31.9  29.1  27.8   Platelets 150 - 400 K/uL 208  207  181     RADS: N/a Assessment:   Bladder fistula   Appreciate urology recs.  Will continue foley to decompress and allow fistula potentially to heal.  KEEP JP DRAINS OFF suction.    New elevated LFTs this morning.  GI consulted.  Appreciate recs for follow-up.  Will observe for 1 more day  for now.  Family members updated.    labs/images/medications/previous chart entries reviewed personally and relevant changes/updates noted above.  labs/images/medications/previous chart entries reviewed personally and relevant changes/updates noted above.

## 2022-09-06 NOTE — Consult Note (Signed)
Cephas Darby, MD [address]  [place], [place] [address]  Main: [phone]  Fax: [phone] Pager: [phone]   Consultation  Referring Provider:     No ref. provider found Primary Care Physician:  Rusty Aus, MD Primary Gastroenterologist: Althia Forts        Reason for Consultation: Newly elevated LFTs  Date of Admission:  08/30/2022 Date of Consultation:  09/06/2022         HPI:   Sarah [name] is a 76 y.o. 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  Urology is on board. Patient was started on ciprofloxacin on Sunday, have been discontinued today after consulting GI.  Because of decreased appetite, unable to participate in rehab, CMP was performed.  She was found to have significantly elevated transaminases AST 76, ALT 442, alkaline phosphatase 212, total bilirubin 0.9 which were normal at the time of admission on 10/3.  CBC revealed a normal WBC count, normocytic anemia.  Serum ferritin levels and B12 levels were normal.  When I interviewed the patient, she denied any abdominal pain, she reports that her appetite is okay, and she offered me to open the milk bottle   NSAIDs: None  Antiplts/Anticoagulants/Anti thrombotics: Eliquis for history of A-fib  GI Procedures: Colonoscopy 09/25/2016 - One small polyp in the cecum, removed with a hot snare. Resected and retrieved. - Diverticulosis in the sigmoid colon and in the descending colon. - Internal  hemorrhoids. - The examination was otherwise normal.  Past Medical History:  Diagnosis Date   Arthritis    hands   Cancer ([place])    thyroid- radiation   Diabetes mellitus without complication ([place])    Hyperlipidemia    Hypertension    Hypothyroidism    IBS (irritable bowel syndrome)     Past Surgical History:  Procedure Laterality Date   ABDOMINAL HYSTERECTOMY     CATARACT EXTRACTION W/PHACO Left 12/21/2016   Procedure: CATARACT EXTRACTION PHACO AND INTRAOCULAR LENS PLACEMENT ([place])  left eye;  Surgeon: Leandrew Koyanagi, MD;  Location: [facility];  Service: Ophthalmology;  Laterality: Left;  Diabetic - oral meds   CATARACT EXTRACTION W/PHACO Right 01/25/2017   Procedure: CATARACT EXTRACTION PHACO AND INTRAOCULAR LENS PLACEMENT ([place])  right diabetic;  Surgeon: Leandrew Koyanagi, MD;  Location: [facility];  Service: Ophthalmology;  Laterality: Right;  diabetic - oral meds   CHOLECYSTECTOMY     COLONOSCOPY WITH PROPOFOL N/A 10/26/2016   Procedure: COLONOSCOPY WITH PROPOFOL;  Surgeon: Manya Silvas, MD;  Location: [facility] END[facility]COPY;  Service: Endoscopy;  Laterality: N/A;   IR RADIOLOGIST EVAL & MGMT  06/14/2022   KNEE SURGERY Right    LEFT HEART CATH AND CORONARY ANGIOGRAPHY N/A 07/29/2022   Procedure: LEFT HEART CATH AND CORONARY ANGIOGRAPHY;  Surgeon: Yolonda Kida, MD;  Location: [facility];  Service: Cardiovascular;  Laterality: N/A;   Current Facility-Administered Medications:    amiodarone (PACERONE) tablet 200 mg, 200 mg, Oral, Daily, Sakai, Isami, DO, 200 mg at 09/06/22 0957   apixaban (ELIQUIS) tablet 2.5 mg, 2.5 mg, Oral, BID, Sakai, Isami, DO,  2.5 mg at 09/06/22 4742   ascorbic acid (VITAMIN C) tablet 500 mg, 500 mg, Oral, BID, Sakai, Isami, DO, 500 mg at 09/06/22 5956   aspirin chewable tablet 81 mg, 81 mg, Oral, Daily, Sakai, Isami, DO, 81 mg at 09/06/22 0957   Chlorhexidine Gluconate Cloth 2 % PADS 6 each, 6 each, Topical, Daily, Sakai,  Isami, DO, 6 each at 09/06/22 0957   cyanocobalamin (VITAMIN B12) tablet 1,000 mcg, 1,000 mcg, Oral, Daily, Sakai, Isami, DO, 1,000 mcg at 09/06/22 0957   docusate sodium (COLACE) capsule 100 mg, 100 mg, Oral, BID PRN, Sakai, Isami, DO   feeding supplement (ENSURE ENLIVE / ENSURE PLUS) liquid 237 mL, 237 mL, Oral, TID BM, Sakai, Isami, DO, 237 mL at 09/06/22 1347   insulin aspart (novoLOG) injection 0-9 Units, 0-9 Units, Subcutaneous, TID WC, Sakai, Isami, DO, 2 Units at 09/06/22 1724   ipratropium (ATROVENT) nebulizer solution 0.5 mg, 0.5 mg, Nebulization, PRN, Sakai, Isami, DO, 0.5 mg at 09/02/22 0607   leptospermum manuka honey (MEDIHONEY) paste 1 Application, 1 Application, Topical, Daily, Sakai, Isami, DO, 1 Application at 38/75/64 0959   levothyroxine (SYNTHROID) tablet 137 mcg, 137 mcg, Oral, Q0600, Sakai, Isami, DO, 137 mcg at 09/06/22 0504   magnesium oxide (MAG-OX) tablet 400 mg, 400 mg, Oral, Daily, Sakai, Isami, DO, 400 mg at 09/06/22 1347   metoprolol tartrate (LOPRESSOR) tablet 25 mg, 25 mg, Oral, TID, Sakai, Isami, DO, 25 mg at 09/06/22 1723   multivitamin with minerals tablet 1 tablet, 1 tablet, Oral, Daily, Sakai, Isami, DO, 1 tablet at 09/06/22 1347   ondansetron (ZOFRAN-ODT) disintegrating tablet 4 mg, 4 mg, Oral, Q6H PRN **OR** ondansetron (ZOFRAN) injection 4 mg, 4 mg, Intravenous, Q6H PRN, Sakai, Isami, DO, 4 mg at 08/30/22 2310   Oral care mouth rinse, 15 mL, Mouth Rinse, PRN, Sakai, Isami, DO   sertraline (ZOLOFT) tablet 100 mg, 100 mg, Oral, Daily, Sakai, Isami, DO, 100 mg at 09/06/22 0957   traMADol (ULTRAM) tablet 50 mg, 50 mg, Oral, Q6H PRN, Sakai, Isami, DO   venlafaxine [facility]) tablet 37.5 mg, 37.5 mg, Oral, Daily, Sakai, Isami, DO, 37.5 mg at 09/06/22 3329   zinc sulfate capsule 220 mg, 220 mg, Oral, Daily, Sakai, Isami, DO, 220 mg at 09/06/22 1347    Family History  Problem Relation Age of Onset   Breast cancer Neg Hx      Social History   Tobacco Use    Smoking status: Never   Smokeless tobacco: Never   Tobacco comments:    social as teenager  Vaping Use   Vaping Use: Never used  Substance Use Topics   Alcohol use: No   Drug use: No    Allergies as of 08/30/2022 - Review Complete 08/30/2022  Allergen Reaction Noted   Penicillins Anaphylaxis 06/21/2015   Sulfa antibiotics Rash 10/25/2016    Review of Systems:    All systems reviewed and negative except where noted in HPI.   Physical Exam:  Vital signs in last 24 hours: Temp:  [98.1 F (36.7 C)-98.3 F (36.8 C)] 98.3 F (36.8 C) (10/10 1533) Pulse Rate:  [67-72] 71 (10/10 1533) Resp:  [16-18] 18 (10/10 1533) BP: (95-112)/(61-64) 101/63 (10/10 1533) SpO2:  [97 %-100 %] 97 % (10/10 1533) Last BM Date : 09/06/22 General: Ill-appearing, poorly nourished, pleasant, cooperative in NAD Head:  Normocephalic and atraumatic. Eyes:   No icterus.   Conjunctiva pink. PERRLA. Ears:  Normal auditory acuity. Neck:  Supple; no masses or thyroidomegaly Lungs: Respirations  even and unlabored. Lungs clear to auscultation bilaterally.   No wheezes, crackles, or rhonchi.  Heart:  Regular rate and rhythm;  Without murmur, clicks, rubs or gallops Abdomen:  Soft, nondistended, nontender. Normal bowel sounds. No appreciable masses or hepatomegaly.  No rebound or guarding, colostomy in place, has JP drain.  Rectal:  Not performed. Msk:  Symmetrical without gross deformities.  Strength generalized weakness Extremities:  Without edema, cyanosis or clubbing. Neurologic:  Alert and oriented x3;  grossly normal neurologically. Skin:  Intact without significant lesions or rashes. Psych:  Alert and cooperative. Normal affect.  LAB RESULTS:    Latest Ref Rng & Units 09/06/2022    4:38 AM 09/02/2022    5:00 AM 09/01/2022    5:16 AM  CBC  WBC 4.0 - 10.5 K/uL 6.7  5.3  5.1   Hemoglobin 12.0 - 15.0 g/dL 9.9  9.1  8.7   Hematocrit 36.0 - 46.0 % 31.9  29.1  27.8   Platelets 150 - 400 K/uL 208  207  181      BMET    Latest Ref Rng & Units 09/06/2022    4:38 AM 09/05/2022    4:56 AM 09/04/2022    5:02 AM  BMP  Glucose 70 - 99 mg/dL 116  174  97   BUN 8 - 23 mg/dL 37  36  33   Creatinine 0.44 - 1.00 mg/dL 0.72  0.82  0.68   Sodium 135 - 145 mmol/L 132  132  131   Potassium 3.5 - 5.1 mmol/L 4.8  5.1  5.5   Chloride 98 - 111 mmol/L 101  98  101   CO2 22 - 32 mmol/L _0 Calcium 8.9 - 10.3 mg/dL 9.2  9.5  9.4     LFT    Latest Ref Rng & Units 09/06/2022    4:38 AM 08/30/2022   11:53 AM 08/12/2022    4:13 AM  Hepatic Function  Total Protein 6.5 - 8.1 g/dL 6.2  7.2  5.9   Albumin 3.5 - 5.0 g/dL 2.5  2.7  2.2   AST 15 - 41 U/L 786  34  21   ALT 0 - 44 U/L 442  24  23   Alk Phosphatase 38 - 126 U/L 212  94  63   Total Bilirubin 0.3 - 1.2 mg/dL 0.9  0.8  0.7      STUDIES: No results found.    Impression / Plan:   PEREL HAUSCHILD is a 76 y.o. 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  GI is consulted to evaluate newly elevated LFTs today.    Elevated LFTs, predominantly hepatocellular pattern, s/p prior cholecystectomy, CT abdomen and pelvis with contrast from 10/3 did not reveal any biliary obstruction or hepatic masses.  LFTs on 10/3 at the time of admission were normal.  Patient has been on amiodarone for last few months Patient has been receiving ciprofloxacin on Sunday and Monday.  Patient's vitals have been stable during this hospitalization.  Likely drug-induced hepatitis Recommend acute viral hepatitis panel Avoid hepatotoxic agents, ciprofloxacin has been discontinued today Monitor LFTs, encourage adequate hydration If transaminases are improving but persistently elevated, patient can be followed up with GI as outpatient  Thank you for involving me in  the care of  this patient.      LOS: 7 days   Sherri Sear, MD  09/06/2022, 5:45 PM    Note: This dictation was prepared with Dragon dictation along with smaller phrase technology. Any transcriptional errors that result from this process are unintentional.

## 2022-09-06 NOTE — Care Management Important Message (Signed)
Important Message  Patient Details  Name: FATIMAH SUNDQUIST MRN: [id number] Date of Birth: 1946/10/27   Medicare Important Message Given:  Yes     Dannette Barbara 09/06/2022, 10:40 AM

## 2022-09-06 NOTE — Plan of Care (Signed)
Pt alert and oriented x4. Dsg changed this am to abdomen. BP soft during overnight. Oncall MD contacted and metoprolol held x 1 dose  Problem: Education: Goal: Ability to describe self-care measures that may prevent or decrease complications (Diabetes Survival Skills Education) will improve Outcome: Progressing Goal: Individualized Educational Video(s) Outcome: Progressing   Problem: Coping: Goal: Ability to adjust to condition or change in health will improve Outcome: Progressing   Problem: Fluid Volume: Goal: Ability to maintain a balanced intake and output will improve Outcome: Progressing   Problem: Health Behavior/Discharge Planning: Goal: Ability to identify and utilize available resources and services will improve Outcome: Progressing Goal: Ability to manage health-related needs will improve Outcome: Progressing   Problem: Metabolic: Goal: Ability to maintain appropriate glucose levels will improve Outcome: Progressing   Problem: Nutritional: Goal: Maintenance of adequate nutrition will improve Outcome: Progressing Goal: Progress toward achieving an optimal weight will improve Outcome: Progressing   Problem: Skin Integrity: Goal: Risk for impaired skin integrity will decrease Outcome: Progressing   Problem: Tissue Perfusion: Goal: Adequacy of tissue perfusion will improve Outcome: Progressing   Problem: Education: Goal: Knowledge of General Education information will improve Description: Including pain rating scale, medication(s)/side effects and non-pharmacologic comfort measures Outcome: Progressing   Problem: Health Behavior/Discharge Planning: Goal: Ability to manage health-related needs will improve Outcome: Progressing   Problem: Clinical Measurements: Goal: Ability to maintain clinical measurements within normal limits will improve Outcome: Progressing Goal: Will remain free from infection Outcome: Progressing Goal: Diagnostic test results will  improve Outcome: Progressing Goal: Respiratory complications will improve Outcome: Progressing Goal: Cardiovascular complication will be avoided Outcome: Progressing   Problem: Activity: Goal: Risk for activity intolerance will decrease Outcome: Progressing   Problem: Nutrition: Goal: Adequate nutrition will be maintained Outcome: Progressing   Problem: Coping: Goal: Level of anxiety will decrease Outcome: Progressing   Problem: Elimination: Goal: Will not experience complications related to bowel motility Outcome: Progressing Goal: Will not experience complications related to urinary retention Outcome: Progressing   Problem: Pain Managment: Goal: General experience of comfort will improve Outcome: Progressing   Problem: Safety: Goal: Ability to remain free from injury will improve Outcome: Progressing   Problem: Skin Integrity: Goal: Risk for impaired skin integrity will decrease Outcome: Progressing

## 2022-09-06 NOTE — Plan of Care (Signed)
  Problem: Nutritional: Goal: Maintenance of adequate nutrition will improve Outcome: Progressing   Problem: Skin Integrity: Goal: Risk for impaired skin integrity will decrease Outcome: Progressing   Problem: Activity: Goal: Risk for activity intolerance will decrease Outcome: Not Progressing   Problem: Nutrition: Goal: Adequate nutrition will be maintained Outcome: Progressing   Problem: Safety: Goal: Ability to remain free from injury will improve Outcome: Progressing

## 2022-09-07 ENCOUNTER — Inpatient Hospital Stay: Payer: Medicare Other

## 2022-09-07 LAB — COMPREHENSIVE METABOLIC PANEL
ALT: 523 U/L — ABNORMAL HIGH (ref 0–44)
AST: 799 U/L — ABNORMAL HIGH (ref 15–41)
Albumin: 2.6 g/dL — ABNORMAL LOW (ref 3.5–5.0)
Alkaline Phosphatase: 302 U/L — ABNORMAL HIGH (ref 38–126)
Anion gap: 9 (ref 5–15)
BUN: 42 mg/dL — ABNORMAL HIGH (ref 8–23)
CO2: 24 mmol/L (ref 22–32)
Calcium: 9.3 mg/dL (ref 8.9–10.3)
Chloride: 100 mmol/L (ref 98–111)
Creatinine, Ser: 0.82 mg/dL (ref 0.44–1.00)
GFR, Estimated: >60 mL/min (ref >60)
Glucose, Bld: 88 mg/dL (ref 70–99)
Potassium: 5.1 mmol/L (ref 3.5–5.1)
Sodium: 133 mmol/L — ABNORMAL LOW (ref 135–145)
Total Bilirubin: 1.1 mg/dL (ref 0.3–1.2)
Total Protein: 6.4 g/dL — ABNORMAL LOW (ref 6.5–8.1)

## 2022-09-07 LAB — GLUCOSE, CAPILLARY
Glucose-Capillary: 81 mg/dL (ref 70–99)
Glucose-Capillary: 116 mg/dL — ABNORMAL HIGH (ref 70–99)
Glucose-Capillary: 124 mg/dL — ABNORMAL HIGH (ref 70–99)
Glucose-Capillary: 131 mg/dL — ABNORMAL HIGH (ref 70–99)

## 2022-09-07 MED ORDER — SODIUM CHLORIDE 0.9 % IV BOLUS
1000.0000 mL | Freq: Once | INTRAVENOUS | Status: AC
  Administered 2022-09-07: 1000 mL via INTRAVENOUS

## 2022-09-07 NOTE — TOC Progression Note (Signed)
Transition of Care [facility]) - Progression Note    Patient Details  Name: Sarah [name] MRN: [id number] Date of Birth: [birth year]  Transition of Care [facility]) CM/SW Contact  Beverly Sessions, RN Phone Number: 09/07/2022, 2:15 PM  Clinical Narrative:          Abd [place] pending.  No DC today.  Sister updated Barnett Apple[facility] updated   Expected Discharge Plan and Services                                                 Social Determinants of Health (SDOH) Interventions    Readmission Risk Interventions    08/14/2022    3:26 PM 07/28/2022    3:27 PM  Readmission Risk Prevention Plan  Transportation Screening Complete Complete  PCP or Specialist Appt within 3-5 Days  Complete  HRI or [place]  Complete  Social Work Consult for [place] Planning/Counseling  Complete  Palliative Care Screening  Complete  Medication Review Press photographer) Complete Complete  PCP or Specialist appointment within 3-5 days of discharge Complete   HRI or [place] Complete   SW Recovery Care/Counseling Consult Complete   [place] Not Applicable

## 2022-09-07 NOTE — Progress Notes (Signed)
PT Cancellation Note  Patient Details Name: Sarah [name] MRN: [id number] DOB: 11/20/1946   Cancelled Treatment:     PT attempt, pt was side-lying in bed upon arrival. She is awake and alert but unwilling to participate. Ask for a reason not to participate and pt states," I just don't want to. I don't feel good."  Acute PT will continue efforts to maximize pt's independence with ADLs.     Willette Pa 09/07/2022, 2:15 PM

## 2022-09-07 NOTE — Progress Notes (Signed)
Subjective:  CC: Sarah [name] is a 76 y.o. female  Hospital stay day 8,   bladder fistula  HPI: No issues overnight.  No acute complaints.  ROS:  General: Denies weight loss, weight gain, fatigue, fevers, chills, and night sweats. Heart: Denies chest pain, palpitations, racing heart, irregular heartbeat, leg pain or swelling, and decreased activity tolerance. Respiratory: Denies breathing difficulty, shortness of breath, wheezing, cough, and sputum. GI: Denies change in appetite, heartburn, nausea, vomiting, constipation, diarrhea, and blood in stool. GU: Denies difficulty urinating, pain with urinating, urgency, frequency, blood in urine.   Objective:   Temp:  [97.6 F (36.4 C)-98.3 F (36.8 C)] 98.1 F (36.7 C) (10/11 0750) Pulse Rate:  [69-73] 71 (10/11 0750) Resp:  [16-18] 18 (10/11 0750) BP: (101-113)/(63-71) 113/71 (10/11 0750) SpO2:  [96 %-100 %] 100 % (10/11 0750)             Intake/Output this shift:   Intake/Output Summary (Last 24 hours) at 09/07/2022 1517 Last data filed at 09/07/2022 1191 Gross per 24 hour  Intake 357 ml  Output 271 ml  Net 86 ml    Constitutional :  alert, cooperative, appears stated age, and no distress  Respiratory:  clear to auscultation bilaterally  Cardiovascular:  regular rate and rhythm  Gastrointestinal: soft, non-tender; bowel sounds normal; no masses,  no organomegaly. Ostomy clean today, midline wound dressing intact. JP with scant urine output and purulent output off suction  Skin: Cool and moist. Sacral wound dressing intact  Psychiatric: Normal affect, non-agitated, not confused       LABS:     Latest Ref Rng & Units 09/07/2022    6:19 AM 09/06/2022    4:38 AM 09/05/2022    4:56 AM  CMP  Glucose 70 - 99 mg/dL 88  116  174   BUN 8 - 23 mg/dL 42  37  36   Creatinine 0.44 - 1.00 mg/dL 0.82  0.72  0.82   Sodium 135 - 145 mmol/L 133  132  132   Potassium 3.5 - 5.1 mmol/L 5.1  4.8  5.1   Chloride 98 - 111 mmol/L 100  101  98    CO2 22 - 32 mmol/L '24  23  26   '$ Calcium 8.9 - 10.3 mg/dL 9.3  9.2  9.5   Total Protein 6.5 - 8.1 g/dL 6.4  6.2    Total Bilirubin 0.3 - 1.2 mg/dL 1.1  0.9    Alkaline Phos 38 - 126 U/L 302  212    AST 15 - 41 U/L 799  786    ALT 0 - 44 U/L 523  442        Latest Ref Rng & Units 09/06/2022    4:38 AM 09/02/2022    5:00 AM 09/01/2022    5:16 AM  CBC  WBC 4.0 - 10.5 K/uL 6.7  5.3  5.1   Hemoglobin 12.0 - 15.0 g/dL 9.9  9.1  8.7   Hematocrit 36.0 - 46.0 % 31.9  29.1  27.8   Platelets 150 - 400 K/uL 208  207  181     RADS: N/a Assessment:   Bladder fistula Elevated LFTs   Ccontinue foley to decompress and allow fistula potentially to heal.  KEEP JP DRAINS OFF suction.    continued elevated LFTs this morning. Slightl worse.  GI requested RUQ [place].   Appreciate recs for follow-up.  Family members updated.   labs/images/medications/previous chart entries reviewed personally and relevant changes/updates noted  above.

## 2022-09-08 ENCOUNTER — Ambulatory Visit: Payer: Medicare Other | Admitting: Urology

## 2022-09-08 ENCOUNTER — Inpatient Hospital Stay
Admit: 2022-09-08 | Discharge: 2022-09-08 | Disposition: A | Discharge status: Home / Self Care | Payer: Medicare Other | Attending: Physician Assistant | Admitting: Physician Assistant

## 2022-09-08 DIAGNOSIS — R338 Other retention of urine: Secondary | ICD-10-CM

## 2022-09-08 DIAGNOSIS — E43 Unspecified severe protein-calorie malnutrition: Secondary | ICD-10-CM

## 2022-09-08 DIAGNOSIS — I4891 Unspecified atrial fibrillation: Secondary | ICD-10-CM | POA: Diagnosis not present

## 2022-09-08 DIAGNOSIS — R7989 Other specified abnormal findings of blood chemistry: Secondary | ICD-10-CM | POA: Diagnosis not present

## 2022-09-08 DIAGNOSIS — N322 Vesical fistula, not elsewhere classified: Secondary | ICD-10-CM | POA: Diagnosis not present

## 2022-09-08 LAB — COMPREHENSIVE METABOLIC PANEL
ALT: 767 U/L — ABNORMAL HIGH (ref 0–44)
AST: 1298 U/L — ABNORMAL HIGH (ref 15–41)
Albumin: 2.7 g/dL — ABNORMAL LOW (ref 3.5–5.0)
Alkaline Phosphatase: 316 U/L — ABNORMAL HIGH (ref 38–126)
Anion gap: 12 (ref 5–15)
BUN: 41 mg/dL — ABNORMAL HIGH (ref 8–23)
CO2: 22 mmol/L (ref 22–32)
Calcium: 9.2 mg/dL (ref 8.9–10.3)
Chloride: 101 mmol/L (ref 98–111)
Creatinine, Ser: 0.83 mg/dL (ref 0.44–1.00)
GFR, Estimated: >60 mL/min (ref >60)
Glucose, Bld: 109 mg/dL — ABNORMAL HIGH (ref 70–99)
Potassium: 4.7 mmol/L (ref 3.5–5.1)
Sodium: 135 mmol/L (ref 135–145)
Total Bilirubin: 1.6 mg/dL — ABNORMAL HIGH (ref 0.3–1.2)
Total Protein: 6.6 g/dL (ref 6.5–8.1)

## 2022-09-08 LAB — GLUCOSE, CAPILLARY
Glucose-Capillary: 96 mg/dL (ref 70–99)
Glucose-Capillary: 94 mg/dL (ref 70–99)
Glucose-Capillary: 77 mg/dL (ref 70–99)
Glucose-Capillary: 96 mg/dL (ref 70–99)

## 2022-09-08 LAB — BRAIN NATRIURETIC PEPTIDE: B Natriuretic Peptide: >4500 pg/mL — ABNORMAL HIGH (ref 0.0–100.0)

## 2022-09-08 MED ORDER — FUROSEMIDE 10 MG/ML IJ SOLN: 20.0000 mg | Freq: Once | INTRAMUSCULAR | Status: DC

## 2022-09-08 MED ORDER — FUROSEMIDE 10 MG/ML IJ SOLN
20.0000 mg | Freq: Two times a day (BID) | INTRAMUSCULAR | Status: DC
  Administered 2022-09-08 – 2022-09-09 (×3): 20 mg via INTRAVENOUS
  Filled 2022-09-08 (×3): qty 4

## 2022-09-08 NOTE — Assessment & Plan Note (Signed)
CBGs currently stable

## 2022-09-08 NOTE — Progress Notes (Deleted)
Noted to have elevated LFTs again. Discussed case with GI and will be transferring her care over to hospitalist for further workup management, since no surgical intervention is needed.  Hospitalist contacted and they agree with plan.  Cardiology also called to discuss discontinuing amiodarone.  They are on consult.  Surgery will peripherally follow for now.  Please call with any questions.  Below is copy of hospital course from pending discharge summary for review:   "Presented to ED with increasing output from her lower abdominal JP drain.  CT scan noted urinary retention, after recent Foley removal for the same issue few days prior to admission.  JP drain output concerning for urine based on timing of increased output and removal of Foley.  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  Foley was placed to decompress the bladder and immediate decrease in JP output was noted.  Urology was consulted and due to her multiple comorbidities including her recent NSTEMI, they recommended conservative management with continued bladder decompression and monitoring.     Patient was admitted and noted to not have any signs of sepsis or clinical deterioration.  Initial antibiotics were stopped and patient was able to continue to maintain a regular diet with no increase in abdominal pain or signs of infection.  We did have some issues with borderline hypotension with systolic in the low 17O and maps hovering around 65.  Fluid boluses and adequate urine output was noted but still remain low so her Lasix and spironolactone that she was coming in taking was discontinued.  Blood pressure improved after that.  urine starting looking darker with repeat U/A noted to have hematuria.  Urology recommended starting Cipro.   On original day of expected discharge, repeat labs drawn for slight increase in fatigue and decreased level of appetite  noted elevated AST and ALT.  Home Tylenol and statin stopped, GI consulted and they recommended stopping cipro as well.  LFTs continue to increase for an additional 2 days afterwards.  Hospitalist requested to take over as primary at this point.  Cardiology also consulted for further management of her medications started for recent NSTEMI."

## 2022-09-08 NOTE — Assessment & Plan Note (Signed)
At previous hospitalization, patient was discharged with Foley catheter after developing urinary retention.  She had Foley catheter removed in urology office on 9/28, however did not follow-up in the afternoon for repeat bladder scan as she was not transported back to the office from her skilled nursing facility.    In the emergency room, found to have increased bladder distention and fistula.  Foley catheter placed to allow fistula to heal

## 2022-09-08 NOTE — Progress Notes (Signed)
PT Cancellation Note  Patient Details Name: Sarah [name] MRN: [id number] DOB: 1946-07-07   Cancelled Treatment:     PT attempt. 2nd attempt this date. " I promise I'll do it tomorrow." Chief Strategy Officer encouraged participation but pt remained unwilling. She requested PT return tomorrow at after lunch.      Julaine Fusi PTA 09/08/22, 1:58 PM

## 2022-09-08 NOTE — Progress Notes (Signed)
Brief cardiology consult note  Atrial fibrillation Ischemic cardiomyopathy History of non-STEMI Elevated LFTs History of colon cancer Hypertension History of rectal bleeding Urinary retention Uncontrolled diabetes Hypothyroidism  . Plan Discontinue amiodarone because of elevated LFTs Continue metoprolol as needed for rate control Agree with oncology for malignant neoplasm Continue management for bladder Continue supplemental nutrition Recommend conservative cardiac input at discharge Continue thyroid management and control

## 2022-09-08 NOTE — TOC Progression Note (Signed)
Transition of Care [facility]) - Progression Note    Patient Details  Name: Sarah [name] MRN: [id number] Date of Birth: 1946-03-07  Transition of Care [facility]) CM/SW [place], LCSW Phone Number: 09/08/2022, 10:40 AM  Clinical Narrative:  Per attending, no discharge today. Sister is aware. Left message for [facility] admissions coordinator to notify.   Expected Discharge Plan and Services                                                 Social Determinants of Health (SDOH) Interventions    Readmission Risk Interventions    08/14/2022    3:26 PM 07/28/2022    3:27 PM  Readmission Risk Prevention Plan  Transportation Screening Complete Complete  PCP or Specialist Appt within 3-5 Days  Complete  HRI or [place]  Complete  Social Work Consult for [place] Planning/Counseling  Complete  Palliative Care Screening  Complete  Medication Review Press photographer) Complete Complete  PCP or Specialist appointment within 3-5 days of discharge Complete   HRI or [place] Complete   SW Recovery Care/Counseling Consult Complete   [place] Not Applicable

## 2022-09-08 NOTE — Progress Notes (Signed)
Sarah Darby, MD [address]  [place]  [place], [place] [address]  Main: [phone]  Fax: [phone] Pager: [phone]   Subjective: Patient reports poor appetite.  Denies any abdominal pain, nausea or vomiting   Objective: Vital signs in last 24 hours: Vitals:   09/08/22 0831 09/08/22 1559 09/08/22 1606 09/08/22 2045  BP: 119/73 (!) 104/59 (!) 104/59 116/70  Pulse: 71 70 70 69  Resp: '18  18 18  '$ Temp: 97.7 F (36.5 C)  98.7 F (37.1 C) 97.7 F (36.5 C)  TempSrc:      SpO2: 99%  97% 97%   Weight change:   Intake/Output Summary (Last 24 hours) at 09/08/2022 2244 Last data filed at 09/08/2022 0800 Gross per 24 hour  Intake 120 ml  Output --  Net 120 ml     Exam: Heart:: Regular rate and rhythm, S1S2 present, or without murmur or extra heart sounds Lungs: normal and clear to auscultation Abdomen: soft, nontender, normal bowel sounds   Lab Results:    Latest Ref Rng & Units 09/06/2022    4:38 AM 09/02/2022    5:00 AM 09/01/2022    5:16 AM  CBC  WBC 4.0 - 10.5 K/uL 6.7  5.3  5.1   Hemoglobin 12.0 - 15.0 g/dL 9.9  9.1  8.7   Hematocrit 36.0 - 46.0 % 31.9  29.1  27.8   Platelets 150 - 400 K/uL 208  207  181       Latest Ref Rng & Units 09/08/2022    4:58 AM 09/07/2022    6:19 AM 09/06/2022    4:38 AM  CMP  Glucose 70 - 99 mg/dL 109  88  116   BUN 8 - 23 mg/dL 41  42  37   Creatinine 0.44 - 1.00 mg/dL 0.83  0.82  0.72   Sodium 135 - 145 mmol/L 135  133  132   Potassium 3.5 - 5.1 mmol/L 4.7  5.1  4.8   Chloride 98 - 111 mmol/L 101  100  101   CO2 22 - 32 mmol/L '22  24  23   '$ Calcium 8.9 - 10.3 mg/dL 9.2  9.3  9.2   Total Protein 6.5 - 8.1 g/dL 6.6  6.4  6.2   Total Bilirubin 0.3 - 1.2 mg/dL 1.6  1.1  0.9   Alkaline Phos 38 - 126 U/L 316  302  212   AST 15 - 41 U/L 1,298  799  786   ALT 0 - 44 U/L 767  523  442     Micro Results: Recent Results (from the past 240 hour(s))  Blood culture (routine x 2)     Status: None   Collection  Time: 08/30/22 11:39 AM   Specimen: BLOOD  Result Value Ref Range Status   Specimen Description BLOOD RIGHT ANTECUBITAL  Final   Special Requests BOTTLES DRAWN AEROBIC AND ANAEROBIC [place]  Final   Culture   Final    NO GROWTH 5 DAYS Performed at [facility], [address] [place], [place] [address]    Report Status 09/04/2022 FINAL  Final  Blood culture (routine x 2)     Status: None   Collection Time: 08/30/22  3:51 PM   Specimen: BLOOD  Result Value Ref Range Status   Specimen Description BLOOD BLOOD LEFT ARM  Final   Special Requests   Final    BOTTLES DRAWN AEROBIC AND ANAEROBIC Blood Culture adequate volume  Culture   Final    NO GROWTH 5 DAYS Performed at [facility], [address] [place], [place] [address]    Report Status 09/04/2022 FINAL  Final   Studies/Results: US Abdomen Limited RUQ (LIVER/GB)  Result Date: 09/07/2022 CLINICAL DATA:  Elevated LFTs EXAM: ULTRASOUND ABDOMEN LIMITED RIGHT UPPER QUADRANT COMPARISON:  CT AP 08/30/22 FINDINGS: Gallbladder: Status post cholecystectomy. Common bile duct: Diameter: 7 mm, which is normal in the setting of cholecystectomy. Liver: No focal lesion identified. Increased echogenicity, as can be seen with hepatic steatosis. Portal vein is patent on color Doppler imaging with normal direction of blood flow towards the liver. Other: Moderate right sided pleural effusion, as seen on CT AP 08/30/22. IMPRESSION: 1. Increased echogenicity of the liver, as can be seen with hepatic steatosis. 2. Moderate right sided pleural effusion, as seen on CT 08/30/22. Electronically Signed   By: Marin Roberts M.D.   On: 09/07/2022 15:03   Medications: I have reviewed the patient's current medications. Prior to Admission:  Medications Prior to Admission  Medication Sig Dispense Refill Last Dose   acetaminophen (TYLENOL) 325 MG tablet Take 2 tablets (650 mg total) by mouth every 4 (four) hours as needed for headache or mild pain.    08/30/2022 at 0925   amiodarone (PACERONE) 200 MG tablet Take 1 tablet (200 mg total) by mouth daily. 30 tablet 1 08/30/2022 at 0925   apixaban (ELIQUIS) 2.5 MG TABS tablet Take 1 tablet (2.5 mg total) by mouth 2 (two) times daily. 60 tablet 2 08/30/2022 at 0925   ascorbic acid (VITAMIN C) 500 MG tablet Take 1 tablet (500 mg total) by mouth 2 (two) times daily. 60 tablet 1 08/30/2022 at 0925   aspirin 81 MG chewable tablet Chew 1 tablet (81 mg total) by mouth daily. 30 tablet 1 08/30/2022 at 0925   atorvastatin (LIPITOR) 10 MG tablet Take 10 mg by mouth daily.   08/30/2022 at 0730   cyanocobalamin 1000 MCG tablet Take 1,000 mcg by mouth daily.   08/30/2022 at 0925   furosemide (LASIX) 40 MG tablet Take 40 mg by mouth daily.   08/30/2022 at 0925   glimepiride (AMARYL) 4 MG tablet Take 4 mg by mouth daily with breakfast.   08/30/2022 at 0925   ipratropium (ATROVENT) 0.02 % nebulizer solution Take 2.5 mLs (0.5 mg total) by nebulization 2 (two) times daily. 75 mL 12 08/30/2022 at 0953   leptospermum manuka honey (MEDIHONEY) PSTE paste Apply 1 Application topically daily. Apply to sacral wound, top with saline moistened gauze dressing, dry dressing and secure with silicone bordered foam. Apply thin layer (3 mm) to wound. 44 mL 3 prn at prn   levalbuterol (XOPENEX) 0.63 MG/3ML nebulizer solution Take 3 mLs (0.63 mg total) by nebulization 2 (two) times daily. 3 mL 12 08/30/2022 at 0953   levothyroxine (SYNTHROID) 137 MCG tablet Take 137 mcg by mouth daily before breakfast.   08/30/2022 at 0540   lisinopril (ZESTRIL) 2.5 MG tablet Take 1 tablet (2.5 mg total) by mouth daily. 30 tablet 1 08/30/2022 at 0925   magnesium oxide (MAG-OX) 400 (240 Mg) MG tablet Take 400 mg by mouth daily.   08/30/2022 at 0925   metoprolol tartrate (LOPRESSOR) 25 MG tablet Take 1 tablet (25 mg total) by mouth 3 (three) times daily. 90 tablet 1 08/30/2022 at 0925   Multiple Vitamin (MULTIVITAMIN WITH MINERALS) TABS tablet Take 1 tablet by mouth  daily. 30 tablet 1 08/30/2022 at 0925   potassium chloride  SA (KLOR-CON M) 20 MEQ tablet Take 20 mEq by mouth 2 (two) times daily.   08/30/2022 at 0925   sertraline (ZOLOFT) 100 MG tablet Take 100 mg by mouth daily.   08/30/2022 at 0925   spironolactone (ALDACTONE) 25 MG tablet Take 0.5 tablets (12.5 mg total) by mouth daily. 30 tablet 1 08/30/2022 at 0925   venlafaxine (EFFEXOR) 37.5 MG tablet Take 37.5 mg by mouth daily.   08/30/2022 at 0925   zinc sulfate 220 (50 Zn) MG capsule Take 1 capsule (220 mg total) by mouth daily. 30 capsule 1 08/30/2022 at 0925   atorvastatin (LIPITOR) 40 MG tablet Take 1 tablet (40 mg total) by mouth at bedtime. (Patient not taking: Reported on 08/30/2022) 30 tablet 1 Not Taking   feeding supplement (ENSURE ENLIVE / ENSURE PLUS) LIQD Take 237 mLs by mouth 3 (three) times daily between meals. 237 mL 12    ipratropium-albuterol (DUONEB) 0.5-2.5 (3) MG/3ML SOLN Take 3 mLs by nebulization every 6 (six) hours as needed. 360 mL  prn at prn   ondansetron (ZOFRAN-ODT) 4 MG disintegrating tablet Take 1 tablet (4 mg total) by mouth every 8 (eight) hours as needed for nausea or vomiting. 12 tablet 0 prn at prn   Scheduled:  apixaban  2.5 mg Oral BID   aspirin  81 mg Oral Daily   Chlorhexidine Gluconate Cloth  6 each Topical Daily   feeding supplement  237 mL Oral TID BM   furosemide  20 mg Intravenous BID   insulin aspart  0-9 Units Subcutaneous TID WC   leptospermum manuka honey  1 Application Topical Daily   levothyroxine  137 mcg Oral Q0600   metoprolol tartrate  25 mg Oral TID   sertraline  100 mg Oral Daily   venlafaxine  37.5 mg Oral Daily   Continuous: ZMO:QHUTMLYY sodium, ipratropium, ondansetron **OR** ondansetron (ZOFRAN) IV, mouth rinse, traMADol Anti-infectives (From admission, onward)    Start     Dose/Rate Route Frequency Ordered Stop   09/05/22 0000  ciprofloxacin (CIPRO) 500 MG tablet  Status:  Discontinued        500 mg Oral 2 times daily 09/05/22 0906  09/06/22    09/04/22 1200  ciprofloxacin (CIPRO) tablet 500 mg  Status:  Discontinued        500 mg Oral 2 times daily 09/04/22 1111 09/06/22 0756   08/30/22 2215  ciprofloxacin (CIPRO) IVPB 400 mg  Status:  Discontinued        400 mg 200 mL/hr over 60 Minutes Intravenous Every 12 hours 08/30/22 2214 09/01/22 1138   08/30/22 2215  metroNIDAZOLE (FLAGYL) IVPB 500 mg  Status:  Discontinued        500 mg 100 mL/hr over 60 Minutes Intravenous Every 12 hours 08/30/22 2214 09/01/22 1138   08/30/22 1530  ceFEPIme (MAXIPIME) 2 g in sodium chloride 0.9 % 100 mL IVPB        2 g 200 mL/hr over 30 Minutes Intravenous  Once 08/30/22 1515 08/30/22 1647   08/30/22 1530  metroNIDAZOLE (FLAGYL) IVPB 500 mg        500 mg 100 mL/hr over 60 Minutes Intravenous  Once 08/30/22 1515 08/30/22 1812      Scheduled Meds:  apixaban  2.5 mg Oral BID   aspirin  81 mg Oral Daily   Chlorhexidine Gluconate Cloth  6 each Topical Daily   feeding supplement  237 mL Oral TID BM   furosemide  20 mg Intravenous BID   insulin aspart  0-9 Units Subcutaneous TID WC   leptospermum manuka honey  1 Application Topical Daily   levothyroxine  137 mcg Oral Q0600   metoprolol tartrate  25 mg Oral TID   sertraline  100 mg Oral Daily   venlafaxine  37.5 mg Oral Daily   Continuous Infusions: PRN Meds:.docusate sodium, ipratropium, ondansetron **OR** ondansetron (ZOFRAN) IV, mouth rinse, traMADol   Assessment: Principal Problem:   Bladder fistula Active Problems:   Protein-calorie malnutrition, severe   Atrial fibrillation with RVR ([place])   Uncontrolled type 2 diabetes mellitus with hyperglycemia, without long-term current use of insulin (HCC)   Hypothyroidism   Elevated LFTs   Acute urinary retention  Sarah [name] is a 76 y.o. 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  GI is consulted to evaluate n for acute hepatitis Plan: Acute hepatitis: predominantly hepatocellular pattern, s/p prior cholecystectomy, CT abdomen and pelvis with contrast from 10/3 did not reveal any biliary obstruction or hepatic masses.  LFTs on 10/3 at the time of admission were normal.  Right upper quadrant ultrasound is unremarkable Differentials include drug-induced hepatitis or congestive hepatopathy, BNP is elevated.  Ischemic hepatitis is less likely because patient is not found to have any hypotensive episodes Patient has been on amiodarone for last few months Amiodarone has been stopped today after discussing with cardiology Patient received ciprofloxacin for 2 days during this admission, stopped after elevation of LFTs Acute viral hepatitis panel negative Avoid hepatotoxic agents, ciprofloxacin has been discontinued on 10/10 Monitor LFTs daily Patient is started on IV Lasix for congestive hepatopathy given elevated BNP Encourage p.o. nutrition   LOS: 9 days   Sarah [name] 09/08/2022, 10:44 PM

## 2022-09-08 NOTE — Progress Notes (Signed)
Noted to have increased LFTs again.  Requested hospitalist team to take over as primary at this point.  Also consulted cardiology for medication management from her recent NSTEMI.  Concern with amiodarone as possibly contributing to her increased LFTs.  Updated sister regarding care.  Surgery will peripherally follow for now since she is stable from a surgical standpoint.  Below is a copy of pending discharge summary/hospital course until today for reference as needed.   "Patient admitted for increasing JP output.  In the ED, CT scan noted a significantly enlarged bladder concerning for urinary retention.  She has a history of urinary retention with a recent void trial that she completed successfully and the Foley was removed.  Interestingly the increasing JP output was reported shortly after the initial Foley removal.  This raised the concern for a bladder fistula.  JP drain fluid was sent out for creatinine levels which came back high and the JP drain output dropped significantly as soon as a Foley was placed for the new urinary retention.  Urology was consulted at this point and they recommended continuing with bladder decompression and follow-up cystogram as an outpatient since she is currently not a good candidate to proceed with any immediate surgical repair.  Both intra-abdominal drains placed from previous admission for an intra-abdominal abscess was placed off of suction to facilitate healing of the bladder fistula.  Patient was admitted for further monitoring to ensure no signs of continuing infection.  She continued to do well as discharge planning was completed.  3 days before planned discharge date, patient noted to have dark urine output but remained asymptomatic.  UA noted hematuria, urology recommended starting Cipro.    Day before discharge, she was noted to have increased fatigue and loss of appetite.  Repeat blood work indicated elevated LFTs.  GI consulted at this point, recommended  stopping hepatotoxic drugs including her home statins and Tylenol, as well as recently started Cipro.  Of note she has not been taking any Tylenol as needed since admission.  Unfortunately, her LFTs continue to increase despite discontinuing the medications above.  We did continue the amiodarone due to her recent NSTEMI but due to the increasing LFTs, cardiology was consulted for further management of her cardiovascular medications in the setting of increasing LFTs.  Hospitalist was also requested to take over as primary at this point since surgically she has remained stable and no surgical intervention will be needed in the immediate future."

## 2022-09-08 NOTE — Assessment & Plan Note (Signed)
Rate controlled.  On Eliquis and Lopressor.  Amiodarone stopped by cardiology if contributing factor to her transaminitis

## 2022-09-08 NOTE — Assessment & Plan Note (Signed)
  Nutrition to see 

## 2022-09-08 NOTE — Assessment & Plan Note (Signed)
As above.  Foley catheter in place allowing time for healing.

## 2022-09-08 NOTE — Hospital Course (Addendum)
55-yof w/ DM2, HTN, HLD, chronic systolic CHF, thyroid cancer, hypothyroidism, recent history of diverticulitis with abscess formation  In July 2023, patient was diagnosed with diverticulitis and abscess.  Underwent IR guided drain placement discharged with symptoms did not improve. 07/19/2022, underwent scheduled laparoscopic sigmoidectomy and Hartman's procedure.  Pathology showed invasive colorectal adenocarcinoma, margins negative, nodes negative. Postop complications, patient developed rapid atrial fibrillation, hypotension and non-STEMI with heart failure and ejection fraction now of 25%.  Also found to have extensive CAD, but not felt to be candidate for CABG given comorbidities.   9/4, she was discharged to a SNF. 9/14, readmitted for Pseudomonas infection midline open wound.  Also found to have intra-abdominal abscess, drained potentially by IR.  During that hospitalization, patient developed urinary retention and was discharged on Foley catheter. 9/28, Foley catheter was removed at urologist office in the morning. However per documentation, she was not brought back in the afternoon from SNF for follow-up imaging.  It is not clear if patient was able to void after Foley catheter was removed.   10/3, patient was sent back from SNF for increased JP drainage.  CT scan showed bladder distention.  After Foley catheter was placed, there was marked decrease in output from her JP drain.  The fluid sample obtained from around JP fluid noted a creatinine of 21.6 indicated of urine. 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  Patient was admitted to [facility] service Over the course of the next several days, patient showed signs of improvement and plan was to discharge her back to skilled nursing however labs done on morning of 10/10 noted significantly elevated transaminases with AST of 786 and ALT of 442.  Previously normal LFTs.  GI consulted with consideration for  possibly drug-induced hepatitis.  Viral hepatitis panel unremarkable.

## 2022-09-08 NOTE — Evaluation (Signed)
Occupational Therapy Evaluation Patient Details Name: Sarah [name] MRN: [id number] DOB: May 16, 1946 Today's Date: 09/08/2022   History of Present Illness Pt is a 76 y.o. female with past medical history significant for CAD, A-fib, prolonged hospitalization with complicated perforated sigmoid diverticulitis with abdominal pelvic abscess drainage s/p 2 abdominal pelvic drain placements with IR and ex lap/Hartman's procedurewho presents to the emergency department with generalized weakness and increased output from her JP drain.  She has been at a facility who noted increased output from her JP drain.  Worsening generalized weakness and fatigue. MD assessment includes: Bladder perforation/fistula.   Clinical Impression   Patient in bed upon arrival and refusing OT treatment stating "Im not getting up today." Patient required max cues for participation.  Attempted to perform grooming tasks, but patient refused. Very self-limiting. Patient was eductaed on benefits of therapy. Patient was agreeable to reposition self in bed. Patient was able to roll in bed with supervision; required assistance to reposition self in bed. Patient left in bed with call bell in reach, bed alarm set, and all needs met.        Recommendations for follow up therapy are one component of a multi-disciplinary discharge planning process, led by the attending physician.  Recommendations may be updated based on patient status, additional functional criteria and insurance authorization.   Follow Up Recommendations  Skilled nursing-short term rehab (<3 hours/day)    Assistance Recommended at Discharge Frequent or constant Supervision/Assistance  Patient can return home with the following A lot of help with walking and/or transfers;A lot of help with bathing/dressing/bathroom       Equipment Recommendations  Other (comment) (Defer to next venue of care.)       Precautions / Restrictions Precautions Precautions:  Fall Restrictions Weight Bearing Restrictions: No      Mobility Bed Mobility   Bed Mobility: Rolling Rolling: Supervision              Transfers                   General transfer comment: Patient refused      Balance       Sitting balance - Comments: refused       Standing balance comment: refused                           ADL either performed or assessed with clinical judgement   ADL                                         General ADL Comments: Patient refused all ADL tasks.     Vision Baseline Vision/History: 1 Wears glasses Patient Visual Report: No change from baseline              Pertinent Vitals/Pain Pain Assessment Pain Assessment: No/denies pain        Extremity/Trunk Assessment Upper Extremity Assessment Upper Extremity Assessment: Generalized weakness   Lower Extremity Assessment Lower Extremity Assessment: Generalized weakness          Cognition Arousal/Alertness: Awake/alert Behavior During Therapy: Flat affect Overall Cognitive Status: Within Functional Limits for tasks assessed  OT Goals(Current goals can be found in the care plan section) Acute Rehab OT Goals OT Goal Formulation: With patient Time For Goal Achievement: 09/15/22 Potential to Achieve Goals: Fair  OT Frequency: Min 2X/week       AM-PAC OT "6 Clicks" Daily Activity     Outcome Measure Help from another person eating meals?: None Help from another person taking care of personal grooming?: A Little Help from another person toileting, which includes using toliet, bedpan, or urinal?: A Lot Help from another person bathing (including washing, rinsing, drying)?: A Lot Help from another person to put on and taking off regular upper body clothing?: A Little Help from another person to put on and taking off regular lower body clothing?: A Lot 6 Click Score: 16   End of  Session Nurse Communication: Mobility status  Activity Tolerance: Patient limited by fatigue;Patient limited by lethargy Patient left: in bed;with call bell/phone within reach;with bed alarm set  OT Visit Diagnosis: Other abnormalities of gait and mobility (R26.89);Muscle weakness (generalized) (M62.81)                Time: 0964-3838 OT Time Calculation (min): 10 min Charges:  OT General Charges $OT Visit: 1 Visit OT Treatments $Therapeutic Activity: 8-22 mins    Krystine Pabst, OTS 09/08/2022, 1:09 PM

## 2022-09-08 NOTE — Assessment & Plan Note (Addendum)
Given acuity of findings and normal LFTs on admission, have strong suspicion this is hepatic congestion from systolic heart failure.  Have ordered stat BNP and will start Lasix if elevated.  GI following.  Viral hepatitis panel unremarkable.  Possible some contributing effect from amiodarone.  Less likely this is from shock liver as patient has not had any significant hypotension and would expect transaminases to be improving, not worsening over the last few days

## 2022-09-08 NOTE — Progress Notes (Signed)
[facility] Progress Note  Patient: Sarah [name]    HCW:[id number]  DOA: 08/30/2022    Date of Service: the patient was seen and examined on 09/08/2022  Brief hospital course: 76 year old female with past medical history of diabetes mellitus and hypertension as well as diverticulitis with abscess formation status post CT-guided drain placement few months prior who presented on 8/22 for scheduled laparoscopic sigmoidectomy which was converted to open Hartman's procedure.  Postop complications, patient developed rapid atrial fibrillation, hypotension and non-STEMI with heart failure and ejection fraction now of 25%.  Also found to have extensive CAD, but not felt to be candidate for CABG given comorbidities.  She was discharged on 9/4 to skilled nursing.  Patient readmitted on 10/3 for increased JP drainage.  Work-up in emergency room found bladder fistula and recurrent abdominal abscesses.  Patient also found to have recurrent urinary retention and Foley catheter placed in the emergency room.  Urology consulted.  It was recommended to continue Foley catheter in place for decompression allowing fistula to potentially heal.  Patient showed signs of improvement and plan was to discharge her back to skilled nursing however labs done on morning of 10/10 noted significantly elevated transaminases with AST of 786 and ALT of 442.  LFTs have previously been checked on admission and were normal.  GI consulted and consideration for possibly drug-induced hepatitis.  Viral hepatitis panel unremarkable.  Transaminases continued to increase and by 10/12, up to 1307 167 respectively with bilirubin mildly increasing to 1.6.    Assessment and Plan: Assessment and Plan: * Bladder fistula As above.  Foley catheter in place allowing time for healing.  Elevated LFTs Given acuity of findings and normal LFTs on admission, have strong suspicion this is hepatic congestion from systolic heart failure.  Have ordered stat  BNP and will start Lasix if elevated.  GI following.  Viral hepatitis panel unremarkable.  Possible some contributing effect from amiodarone.  Less likely this is from shock liver as patient has not had any significant hypotension and would expect transaminases to be improving, not worsening over the last few days  Acute urinary retention At previous hospitalization, patient was discharged with Foley catheter after developing urinary retention.  She had Foley catheter removed in urology office on 9/28, however did not follow-up in the afternoon for repeat bladder scan as she was not transported back to the office from her skilled nursing facility.    In the emergency room, found to have increased bladder distention and fistula.  Foley catheter placed to allow fistula to heal  Uncontrolled type 2 diabetes mellitus with hyperglycemia, without long-term current use of insulin (HCC) CBGs currently stable  Atrial fibrillation with RVR ([place]) Rate controlled.  On Eliquis and Lopressor.  Amiodarone stopped by cardiology if contributing factor to her transaminitis  Protein-calorie malnutrition, severe Nutrition to see  Hypothyroidism Continue Synthroid       There is no height or weight on file to calculate BMI.    Pressure Injury 07/21/22 Buttocks Right Unstageable - Full thickness tissue loss in which the base of the injury is covered by slough (yellow, tan, gray, green or brown) and/or eschar (tan, brown or black) in the wound bed. (Active)  07/21/22 1400  Location: Buttocks  Location Orientation: Right  Staging: Unstageable - Full thickness tissue loss in which the base of the injury is covered by slough (yellow, tan, gray, green or brown) and/or eschar (tan, brown or black) in the wound bed.  Wound Description (Comments):  Present on Admission: Yes  Dressing Type Foam - Lift dressing to assess site every shift 09/08/22 1700     Consultants: [facility]  (assuming attending service from general surgery)  Procedures: Foley catheter placement  Antimicrobials: IV cefepime x1 dose 10/3 IV Cipro 10/3 - 10/5, 10/8 - 10/10 IV Flagyl 10/3 - 10/5  Code Status: DNR   Subjective: Patient denies any shortness of breath, does complain of fatigue which is not new for her.  Objective: Vital signs were reviewed and unremarkable. Vitals:   09/08/22 1559 09/08/22 1606  BP: (!) 104/59 (!) 104/59  Pulse: 70 70  Resp:  18  Temp:  98.7 F (37.1 C)  SpO2:  97%    Intake/Output Summary (Last 24 hours) at 09/08/2022 1945 Last data filed at 09/08/2022 0800 Gross per 24 hour  Intake 120 ml  Output --  Net 120 ml   There were no vitals filed for this visit. There is no height or weight on file to calculate BMI.  Exam:  General: Alert and oriented x3, fatigued, no acute distress HEENT: Normocephalic, atraumatic, mucous membranes are moist Cardiovascular: Irregular rhythm, rate controlled, 2 out of 6 systolic ejection murmur Respiratory: Clear to auscultation bilaterally Abdomen: Soft, nontender, nondistended, positive bowel sounds Musculoskeletal: No clubbing or cyanosis, trace pitting edema Psychiatry: Appropriate, no evidence of psychoses Neurology: No focal deficits  Data Reviewed: Noted worsening transaminitis, low albumin, increasing bilirubin and alkaline phosphatase  Disposition:  Status is: Inpatient Remains inpatient appropriate because:  -Working up transaminitis, suspecting acute heart failure -If acute heart failure, needs full diuresis     Anticipated discharge date: 10/16, return back to skilled nursing  Family Communication: We will call family DVT Prophylaxis: apixaban (ELIQUIS) tablet 2.5 mg Start: 08/30/22 2215 SCDs Start: 08/30/22 2212 apixaban (ELIQUIS) tablet 2.5 mg    Author: Annita Brod ,MD 09/08/2022 7:45 PM  To reach On-call, see care teams to locate the attending and reach out via  [email or web address]. Between 7PM-7AM, please contact night-coverage If you still have difficulty reaching the attending provider, please page the [facility] (Director on Call) for [facility] on amion for assistance.

## 2022-09-08 NOTE — Assessment & Plan Note (Signed)
Continue Synthroid °

## 2022-09-08 NOTE — Plan of Care (Signed)
  Problem: Coping: Goal: Ability to adjust to condition or change in health will improve Outcome: Progressing   Problem: Fluid Volume: Goal: Ability to maintain a balanced intake and output will improve Outcome: Progressing   Problem: Health Behavior/Discharge Planning: Goal: Ability to manage health-related needs will improve Outcome: Progressing   

## 2022-09-09 ENCOUNTER — Telehealth: Payer: Self-pay | Admitting: Urology

## 2022-09-09 DIAGNOSIS — B179 Acute viral hepatitis, unspecified: Secondary | ICD-10-CM

## 2022-09-09 DIAGNOSIS — N322 Vesical fistula, not elsewhere classified: Secondary | ICD-10-CM | POA: Diagnosis not present

## 2022-09-09 LAB — CMV IGM: CMV IgM: <30 AU/mL (ref 0.0–29.9)

## 2022-09-09 LAB — COMPREHENSIVE METABOLIC PANEL
ALT: 1024 U/L — ABNORMAL HIGH (ref 0–44)
AST: 1838 U/L — ABNORMAL HIGH (ref 15–41)
Albumin: 2.6 g/dL — ABNORMAL LOW (ref 3.5–5.0)
Alkaline Phosphatase: 303 U/L — ABNORMAL HIGH (ref 38–126)
Anion gap: 12 (ref 5–15)
BUN: 49 mg/dL — ABNORMAL HIGH (ref 8–23)
CO2: 23 mmol/L (ref 22–32)
Calcium: 9.2 mg/dL (ref 8.9–10.3)
Chloride: 100 mmol/L (ref 98–111)
Creatinine, Ser: 0.78 mg/dL (ref 0.44–1.00)
GFR, Estimated: >60 mL/min (ref >60)
Glucose, Bld: 111 mg/dL — ABNORMAL HIGH (ref 70–99)
Potassium: 4.6 mmol/L (ref 3.5–5.1)
Sodium: 135 mmol/L (ref 135–145)
Total Bilirubin: 2.2 mg/dL — ABNORMAL HIGH (ref 0.3–1.2)
Total Protein: 6.2 g/dL — ABNORMAL LOW (ref 6.5–8.1)

## 2022-09-09 LAB — GLUCOSE, CAPILLARY
Glucose-Capillary: 118 mg/dL — ABNORMAL HIGH (ref 70–99)
Glucose-Capillary: 146 mg/dL — ABNORMAL HIGH (ref 70–99)
Glucose-Capillary: 164 mg/dL — ABNORMAL HIGH (ref 70–99)
Glucose-Capillary: 144 mg/dL — ABNORMAL HIGH (ref 70–99)

## 2022-09-09 LAB — PROTIME-INR
INR: 3.4 — ABNORMAL HIGH (ref 0.8–1.2)
Prothrombin Time: 34 seconds — ABNORMAL HIGH (ref 11.4–15.2)

## 2022-09-09 LAB — EPSTEIN-BARR VIRUS VCA, IGM: EBV VCA IgM: <36 U/mL (ref 0.0–35.9)

## 2022-09-09 NOTE — Care Management Important Message (Signed)
Important Message  Patient Details  Name: Sarah [name] MRN: [id number] Date of Birth: 11-11-1946   Medicare Important Message Given:  Yes     Dannette Barbara 09/09/2022, 10:31 AM

## 2022-09-09 NOTE — Telephone Encounter (Signed)
Patient's sister called inquiring that patient is currently in the hospital and will need a CT scan prior to her appt on 09/21/22 with [facility] since patient is currently in the hospital and that would save her a trip from having to go home then go all the way back?  Tressie Stalker, Sister, [phone]

## 2022-09-09 NOTE — Progress Notes (Signed)
Sarah Darby, MD [address]  [place]  [place], [place] [address]  Main: [phone]  Fax: [phone] Pager: [phone]   Subjective: Patient reports poor appetite.  Denies any abdominal pain, nausea or vomiting   Objective: Vital signs in last 24 hours: Vitals:   09/08/22 2045 09/09/22 0514 09/09/22 0757 09/09/22 1537  BP: 116/70 123/74 101/61 107/63  Pulse: 69 68 69 65  Resp: '18 17 17 17  '$ Temp: 97.7 F (36.5 C) 97.6 F (36.4 C) 97.6 F (36.4 C)   TempSrc:  Axillary Oral   SpO2: 97% 96% 98% 97%   Weight change:   Intake/Output Summary (Last 24 hours) at 09/09/2022 1744 Last data filed at 09/09/2022 1439 Gross per 24 hour  Intake 600 ml  Output 715 ml  Net -115 ml     Exam: Heart:: Regular rate and rhythm, S1S2 present, or without murmur or extra heart sounds Lungs: normal and clear to auscultation Abdomen: soft, nontender, normal bowel sounds   Lab Results:    Latest Ref Rng & Units 09/06/2022    4:38 AM 09/02/2022    5:00 AM 09/01/2022    5:16 AM  CBC  WBC 4.0 - 10.5 K/uL 6.7  5.3  5.1   Hemoglobin 12.0 - 15.0 g/dL 9.9  9.1  8.7   Hematocrit 36.0 - 46.0 % 31.9  29.1  27.8   Platelets 150 - 400 K/uL 208  207  181       Latest Ref Rng & Units 09/09/2022    5:28 AM 09/08/2022    4:58 AM 09/07/2022    6:19 AM  CMP  Glucose 70 - 99 mg/dL 111  109  88   BUN 8 - 23 mg/dL 49  41  42   Creatinine 0.44 - 1.00 mg/dL 0.78  0.83  0.82   Sodium 135 - 145 mmol/L 135  135  133   Potassium 3.5 - 5.1 mmol/L 4.6  4.7  5.1   Chloride 98 - 111 mmol/L 100  101  100   CO2 22 - 32 mmol/L '23  22  24   '$ Calcium 8.9 - 10.3 mg/dL 9.2  9.2  9.3   Total Protein 6.5 - 8.1 g/dL 6.2  6.6  6.4   Total Bilirubin 0.3 - 1.2 mg/dL 2.2  1.6  1.1   Alkaline Phos 38 - 126 U/L 303  316  302   AST 15 - 41 U/L 1,838  1,298  799   ALT 0 - 44 U/L 1,024  767  523     Micro Results: No results found for this or any previous visit (from the past 240  hour(s)).  Studies/Results: No results found. Medications: I have reviewed the patient's current medications. Prior to Admission:  Medications Prior to Admission  Medication Sig Dispense Refill Last Dose   acetaminophen (TYLENOL) 325 MG tablet Take 2 tablets (650 mg total) by mouth every 4 (four) hours as needed for headache or mild pain.   08/30/2022 at 0925   amiodarone (PACERONE) 200 MG tablet Take 1 tablet (200 mg total) by mouth daily. 30 tablet 1 08/30/2022 at 0925   apixaban (ELIQUIS) 2.5 MG TABS tablet Take 1 tablet (2.5 mg total) by mouth 2 (two) times daily. 60 tablet 2 08/30/2022 at 0925   ascorbic acid (VITAMIN C) 500 MG tablet Take 1 tablet (500 mg total) by mouth 2 (two) times daily. 60 tablet 1 08/30/2022 at 0925   aspirin 81 MG  chewable tablet Chew 1 tablet (81 mg total) by mouth daily. 30 tablet 1 08/30/2022 at 0925   atorvastatin (LIPITOR) 10 MG tablet Take 10 mg by mouth daily.   08/30/2022 at 0730   cyanocobalamin 1000 MCG tablet Take 1,000 mcg by mouth daily.   08/30/2022 at 0925   furosemide (LASIX) 40 MG tablet Take 40 mg by mouth daily.   08/30/2022 at 0925   glimepiride (AMARYL) 4 MG tablet Take 4 mg by mouth daily with breakfast.   08/30/2022 at 0925   ipratropium (ATROVENT) 0.02 % nebulizer solution Take 2.5 mLs (0.5 mg total) by nebulization 2 (two) times daily. 75 mL 12 08/30/2022 at 0953   leptospermum manuka honey (MEDIHONEY) PSTE paste Apply 1 Application topically daily. Apply to sacral wound, top with saline moistened gauze dressing, dry dressing and secure with silicone bordered foam. Apply thin layer (3 mm) to wound. 44 mL 3 prn at prn   levalbuterol (XOPENEX) 0.63 MG/3ML nebulizer solution Take 3 mLs (0.63 mg total) by nebulization 2 (two) times daily. 3 mL 12 08/30/2022 at 0953   levothyroxine (SYNTHROID) 137 MCG tablet Take 137 mcg by mouth daily before breakfast.   08/30/2022 at 0540   lisinopril (ZESTRIL) 2.5 MG tablet Take 1 tablet (2.5 mg total) by mouth daily. 30  tablet 1 08/30/2022 at 0925   magnesium oxide (MAG-OX) 400 (240 Mg) MG tablet Take 400 mg by mouth daily.   08/30/2022 at 0925   metoprolol tartrate (LOPRESSOR) 25 MG tablet Take 1 tablet (25 mg total) by mouth 3 (three) times daily. 90 tablet 1 08/30/2022 at 0925   Multiple Vitamin (MULTIVITAMIN WITH MINERALS) TABS tablet Take 1 tablet by mouth daily. 30 tablet 1 08/30/2022 at 0925   potassium chloride SA (KLOR-CON M) 20 MEQ tablet Take 20 mEq by mouth 2 (two) times daily.   08/30/2022 at 0925   sertraline (ZOLOFT) 100 MG tablet Take 100 mg by mouth daily.   08/30/2022 at 0925   spironolactone (ALDACTONE) 25 MG tablet Take 0.5 tablets (12.5 mg total) by mouth daily. 30 tablet 1 08/30/2022 at 0925   venlafaxine (EFFEXOR) 37.5 MG tablet Take 37.5 mg by mouth daily.   08/30/2022 at 0925   zinc sulfate 220 (50 Zn) MG capsule Take 1 capsule (220 mg total) by mouth daily. 30 capsule 1 08/30/2022 at 0925   atorvastatin (LIPITOR) 40 MG tablet Take 1 tablet (40 mg total) by mouth at bedtime. (Patient not taking: Reported on 08/30/2022) 30 tablet 1 Not Taking   feeding supplement (ENSURE ENLIVE / ENSURE PLUS) LIQD Take 237 mLs by mouth 3 (three) times daily between meals. 237 mL 12    ipratropium-albuterol (DUONEB) 0.5-2.5 (3) MG/3ML SOLN Take 3 mLs by nebulization every 6 (six) hours as needed. 360 mL  prn at prn   ondansetron (ZOFRAN-ODT) 4 MG disintegrating tablet Take 1 tablet (4 mg total) by mouth every 8 (eight) hours as needed for nausea or vomiting. 12 tablet 0 prn at prn   Scheduled:  apixaban  2.5 mg Oral BID   aspirin  81 mg Oral Daily   Chlorhexidine Gluconate Cloth  6 each Topical Daily   feeding supplement  237 mL Oral TID BM   furosemide  20 mg Intravenous BID   insulin aspart  0-9 Units Subcutaneous TID WC   leptospermum manuka honey  1 Application Topical Daily   levothyroxine  137 mcg Oral Q0600   metoprolol tartrate  25 mg Oral TID   sertraline  100 mg Oral Daily   venlafaxine  37.5 mg Oral  Daily   Continuous: RAX:ENMMHWKG sodium, ipratropium, ondansetron **OR** ondansetron (ZOFRAN) IV, mouth rinse, traMADol Anti-infectives (From admission, onward)    Start     Dose/Rate Route Frequency Ordered Stop   09/05/22 0000  ciprofloxacin (CIPRO) 500 MG tablet  Status:  Discontinued        500 mg Oral 2 times daily 09/05/22 0906 09/06/22    09/04/22 1200  ciprofloxacin (CIPRO) tablet 500 mg  Status:  Discontinued        500 mg Oral 2 times daily 09/04/22 1111 09/06/22 0756   08/30/22 2215  ciprofloxacin (CIPRO) IVPB 400 mg  Status:  Discontinued        400 mg 200 mL/hr over 60 Minutes Intravenous Every 12 hours 08/30/22 2214 09/01/22 1138   08/30/22 2215  metroNIDAZOLE (FLAGYL) IVPB 500 mg  Status:  Discontinued        500 mg 100 mL/hr over 60 Minutes Intravenous Every 12 hours 08/30/22 2214 09/01/22 1138   08/30/22 1530  ceFEPIme (MAXIPIME) 2 g in sodium chloride 0.9 % 100 mL IVPB        2 g 200 mL/hr over 30 Minutes Intravenous  Once 08/30/22 1515 08/30/22 1647   08/30/22 1530  metroNIDAZOLE (FLAGYL) IVPB 500 mg        500 mg 100 mL/hr over 60 Minutes Intravenous  Once 08/30/22 1515 08/30/22 1812      Scheduled Meds:  apixaban  2.5 mg Oral BID   aspirin  81 mg Oral Daily   Chlorhexidine Gluconate Cloth  6 each Topical Daily   feeding supplement  237 mL Oral TID BM   furosemide  20 mg Intravenous BID   insulin aspart  0-9 Units Subcutaneous TID WC   leptospermum manuka honey  1 Application Topical Daily   levothyroxine  137 mcg Oral Q0600   metoprolol tartrate  25 mg Oral TID   sertraline  100 mg Oral Daily   venlafaxine  37.5 mg Oral Daily   Continuous Infusions: PRN Meds:.docusate sodium, ipratropium, ondansetron **OR** ondansetron (ZOFRAN) IV, mouth rinse, traMADol   Assessment: Principal Problem:   Bladder fistula Active Problems:   Protein-calorie malnutrition, severe   Atrial fibrillation with RVR ([place])   Uncontrolled type 2 diabetes mellitus with  hyperglycemia, without long-term current use of insulin (HCC)   Hypothyroidism   Elevated LFTs   Acute urinary retention  Sarah [name] is a 75 y.o. 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  GI is consulted to evaluate n for acute hepatitis  Plan: Acute hepatitis: predominantly hepatocellular pattern, s/p prior cholecystectomy, CT abdomen and pelvis with contrast from 10/3 did not reveal any biliary obstruction or hepatic masses.  LFTs on 10/3 at the time of admission were normal.  Right upper quadrant ultrasound is unremarkable Differentials include drug-induced hepatitis or congestive hepatopathy, BNP is elevated.  Ischemic hepatitis is less likely because patient is not found to have any hypotensive episodes Patient has been on amiodarone for last few months Amiodarone has been discontinued on 09/08/2022 Statin has been discontinued as well  Patient received ciprofloxacin for 2 days during this admission, stopped after elevation of LFTs Acute viral hepatitis panel negative, CMV IgM negative, HSV PCR and EBV are in process Avoid hepatotoxic agents, ciprofloxacin has been  discontinued on 10/10 Monitor LFTs daily Patient is started on IV Lasix for congestive hepatopathy given elevated BNP Encourage p.o. nutrition Falsely elevated INR secondary to Eliquis Patient is mentating well, not in acute liver failure  Dr. Alice Reichert will cover for the weekend    LOS: 10 days   Sarah [name] 09/09/2022, 5:44 PM

## 2022-09-09 NOTE — Telephone Encounter (Signed)
I advised patient sister that the order is already in . They are welcome to call scheduling to change the appt.  The patient was admitted by Dr. Lysle Pearl . There is no dpr where I can tell the sister anything about Sarah [name] . The sister is very upset that I was not able to change the  appt and give her any other information about the patient.

## 2022-09-09 NOTE — Plan of Care (Signed)
  Problem: Education: Goal: Ability to describe self-care measures that may prevent or decrease complications (Diabetes Survival Skills Education) will improve Outcome: Progressing Goal: Individualized Educational Video(s) Outcome: Progressing   Problem: Coping: Goal: Ability to adjust to condition or change in health will improve Outcome: Progressing   

## 2022-09-09 NOTE — Progress Notes (Signed)
[facility] Cardiology    SUBJECTIVE: Patient feels weak tired fatigue denies any pain no shortness of breath no appetite mild nausea no evidence of bleeding denies any palpitations or tachycardia or fever   Vitals:   09/08/22 1606 09/08/22 2045 09/09/22 0514 09/09/22 0757  BP: (!) 104/59 116/70 123/74 101/61  Pulse: 70 69 68 69  Resp: '18 18 17 17  '$ Temp: 98.7 F (37.1 C) 97.7 F (36.5 C) 97.6 F (36.4 C) 97.6 F (36.4 C)  TempSrc:   Axillary Oral  SpO2: 97% 97% 96% 98%     Intake/Output Summary (Last 24 hours) at 09/09/2022 3474 Last data filed at 09/09/2022 0710 Gross per 24 hour  Intake 600 ml  Output 715 ml  Net -115 ml      PHYSICAL EXAM  General: Well developed, well nourished, in no acute distress HEENT:  Normocephalic and atramatic Neck:  No JVD.  Lungs: Clear bilaterally to auscultation and percussion. Heart: Irregular irregular. Normal S1 and S2 without gallops or murmurs.  Abdomen: Bowel sounds are positive, abdomen soft and non-tender  Msk:  Back normal, normal gait. Normal strength and tone for age. Extremities: No clubbing, cyanosis or edema.   Neuro: Alert and oriented X 3. Psych:  Good affect, responds appropriately   LABS: Basic Metabolic Panel: Recent Labs    09/08/22 0458 09/09/22 0528  NA 135 135  K 4.7 4.6  CL 101 100  CO2 22 23  GLUCOSE 109* 111*  BUN 41* 49*  CREATININE 0.83 0.78  CALCIUM 9.2 9.2   Liver Function Tests: Recent Labs    09/08/22 0458 09/09/22 0528  AST 1,298* 1,838*  ALT 767* 1,024*  ALKPHOS 316* 303*  BILITOT 1.6* 2.2*  PROT 6.6 6.2*  ALBUMIN 2.7* 2.6*   No results for input(s): "LIPASE", "AMYLASE" in the last 72 hours. CBC: No results for input(s): "WBC", "NEUTROABS", "HGB", "HCT", "MCV", "PLT" in the last 72 hours. Cardiac Enzymes: No results for input(s): "CKTOTAL", "CKMB", "CKMBINDEX", "TROPONINI" in the last 72 hours. BNP: Invalid input(s): "POCBNP" D-Dimer: No results for input(s): "DDIMER" in the last  72 hours. Hemoglobin A1C: No results for input(s): "HGBA1C" in the last 72 hours. Fasting Lipid Panel: No results for input(s): "CHOL", "HDL", "LDLCALC", "TRIG", "CHOLHDL", "LDLDIRECT" in the last 72 hours. Thyroid Function Tests: No results for input(s): "TSH", "T4TOTAL", "T3FREE", "THYROIDAB" in the last 72 hours.  Invalid input(s): "FREET3" Anemia Panel: No results for input(s): "VITAMINB12", "FOLATE", "FERRITIN", "TIBC", "IRON", "RETICCTPCT" in the last 72 hours.  US Abdomen Limited RUQ (LIVER/GB)  Result Date: 09/07/2022 CLINICAL DATA:  Elevated LFTs EXAM: ULTRASOUND ABDOMEN LIMITED RIGHT UPPER QUADRANT COMPARISON:  CT AP 08/30/22 FINDINGS: Gallbladder: Status post cholecystectomy. Common bile duct: Diameter: 7 mm, which is normal in the setting of cholecystectomy. Liver: No focal lesion identified. Increased echogenicity, as can be seen with hepatic steatosis. Portal vein is patent on color Doppler imaging with normal direction of blood flow towards the liver. Other: Moderate right sided pleural effusion, as seen on CT AP 08/30/22. IMPRESSION: 1. Increased echogenicity of the liver, as can be seen with hepatic steatosis. 2. Moderate right sided pleural effusion, as seen on CT 08/30/22. Electronically Signed   By: Marin Roberts M.D.   On: 09/07/2022 15:03     Echo 07/26/2022 ischemic cardiomyopathy ejection fraction diminished less than 30%  TELEMETRY: :  ASSESSMENT AND PLAN:  Principal Problem:   Bladder fistula Active Problems:   Protein-calorie malnutrition, severe   Atrial fibrillation with RVR ([place])  Uncontrolled type 2 diabetes mellitus with hyperglycemia, without long-term current use of insulin (HCC)   Hypothyroidism   Elevated LFTs   Acute urinary retention   Plan  Elevated LFTs unclear etiology unlikely to be related to poor perfusion Continue to hold amiodarone though I do not believe amiodarone is the culprit for the elevated LFTs Continue metoprolol for atrial  fibrillation rate control as necessary Maintain current bladder management. Aggressive nutrition supplementation Status post non-STEMI continue conservative management Ischemic cardiomyopathy appears to be compensated stable History of anemia rectal bleeding stable continue conservative management Continue diabetes management and control     Yolonda Kida, MD, 09/09/2022 8:29 AM

## 2022-09-09 NOTE — Consult Note (Signed)
CARDIOLOGY CONSULT NOTE               Patient ID: Sarah [name] MRN: [id number] DOB/AGE: 1946/05/29 76 y.o.  Admit date: 08/30/2022 Referring Physician Dr. Benjamine Sprague Primary Physician Emily Filbert MD Primary Cardiologist Dr Saralyn Pilar Reason for Consultation AFIB/Elevated LFTs  HPI: Patient is a 76 year old female known history of recent Non-STEMI preoperatively ischemic cardiomyopathy.  Patient is postop after bladder surgery the patient now has elevated LFTs denies any chest pain or shortness of breath but with known multivessel coronary disease cardiomyopathy congestive heart failure atrial fibrillation hypertension hyperlipidemia diabetes cardiology consultation was recommended after the patient had rapid atrial fibrillation.  Patient states to be doing reasonably well has increased LFTs but unchanged renal function resting comfortably in bed no chest pain has been on  amiodarone.  It is unclear what is contributed to her increased LFTs but since amiodarone could be a potential culprit we will discontinue  Review of systems complete and found to be negative unless listed above     Past Medical History:  Diagnosis Date   Arthritis    hands   Cancer ([place])    thyroid- radiation   Diabetes mellitus without complication ([place])    Hyperlipidemia    Hypertension    Hypothyroidism    IBS (irritable bowel syndrome)     Past Surgical History:  Procedure Laterality Date   ABDOMINAL HYSTERECTOMY     CATARACT EXTRACTION W/PHACO Left 12/21/2016   Procedure: CATARACT EXTRACTION PHACO AND INTRAOCULAR LENS PLACEMENT ([place])  left eye;  Surgeon: Leandrew Koyanagi, MD;  Location: [place];  Service: Ophthalmology;  Laterality: Left;  Diabetic - oral meds   CATARACT EXTRACTION W/PHACO Right 01/25/2017   Procedure: CATARACT EXTRACTION PHACO AND INTRAOCULAR LENS PLACEMENT ([place])  right diabetic;  Surgeon: Leandrew Koyanagi, MD;  Location: [place] [facility];  Service: Ophthalmology;   Laterality: Right;  diabetic - oral meds   CHOLECYSTECTOMY     COLONOSCOPY WITH PROPOFOL N/A 10/26/2016   Procedure: COLONOSCOPY WITH PROPOFOL;  Surgeon: Manya Silvas, MD;  Location: [facility] ENDOSCOPY;  Service: Endoscopy;  Laterality: N/A;   IR RADIOLOGIST EVAL & MGMT  06/14/2022   KNEE SURGERY Right    LEFT HEART CATH AND CORONARY ANGIOGRAPHY N/A 07/29/2022   Procedure: LEFT HEART CATH AND CORONARY ANGIOGRAPHY;  Surgeon: Yolonda Kida, MD;  Location: [place] [facility];  Service: Cardiovascular;  Laterality: N/A;    Medications Prior to Admission  Medication Sig Dispense Refill Last Dose   acetaminophen (TYLENOL) 325 MG tablet Take 2 tablets (650 mg total) by mouth every 4 (four) hours as needed for headache or mild pain.   08/30/2022 at 0925   amiodarone (PACERONE) 200 MG tablet Take 1 tablet (200 mg total) by mouth daily. 30 tablet 1 08/30/2022 at 0925   apixaban (ELIQUIS) 2.5 MG TABS tablet Take 1 tablet (2.5 mg total) by mouth 2 (two) times daily. 60 tablet 2 08/30/2022 at 0925   ascorbic acid (VITAMIN C) 500 MG tablet Take 1 tablet (500 mg total) by mouth 2 (two) times daily. 60 tablet 1 08/30/2022 at 0925   aspirin 81 MG chewable tablet Chew 1 tablet (81 mg total) by mouth daily. 30 tablet 1 08/30/2022 at 0925   atorvastatin (LIPITOR) 10 MG tablet Take 10 mg by mouth daily.   08/30/2022 at 0730   cyanocobalamin 1000 MCG tablet Take 1,000 mcg by mouth daily.   08/30/2022 at 0925   furosemide (LASIX) 40 MG tablet Take  40 mg by mouth daily.   08/30/2022 at 0925   glimepiride (AMARYL) 4 MG tablet Take 4 mg by mouth daily with breakfast.   08/30/2022 at 0925   ipratropium (ATROVENT) 0.02 % nebulizer solution Take 2.5 mLs (0.5 mg total) by nebulization 2 (two) times daily. 75 mL 12 08/30/2022 at 0953   leptospermum manuka honey (MEDIHONEY) PSTE paste Apply 1 Application topically daily. Apply to sacral wound, top with saline moistened gauze dressing, dry dressing and secure with silicone bordered  foam. Apply thin layer (3 mm) to wound. 44 mL 3 prn at prn   levalbuterol (XOPENEX) 0.63 MG/3ML nebulizer solution Take 3 mLs (0.63 mg total) by nebulization 2 (two) times daily. 3 mL 12 08/30/2022 at 0953   levothyroxine (SYNTHROID) 137 MCG tablet Take 137 mcg by mouth daily before breakfast.   08/30/2022 at 0540   lisinopril (ZESTRIL) 2.5 MG tablet Take 1 tablet (2.5 mg total) by mouth daily. 30 tablet 1 08/30/2022 at 0925   magnesium oxide (MAG-OX) 400 (240 Mg) MG tablet Take 400 mg by mouth daily.   08/30/2022 at 0925   metoprolol tartrate (LOPRESSOR) 25 MG tablet Take 1 tablet (25 mg total) by mouth 3 (three) times daily. 90 tablet 1 08/30/2022 at 0925   Multiple Vitamin (MULTIVITAMIN WITH MINERALS) TABS tablet Take 1 tablet by mouth daily. 30 tablet 1 08/30/2022 at 0925   potassium chloride SA (KLOR-CON M) 20 MEQ tablet Take 20 mEq by mouth 2 (two) times daily.   08/30/2022 at 0925   sertraline (ZOLOFT) 100 MG tablet Take 100 mg by mouth daily.   08/30/2022 at 0925   spironolactone (ALDACTONE) 25 MG tablet Take 0.5 tablets (12.5 mg total) by mouth daily. 30 tablet 1 08/30/2022 at 0925   venlafaxine (EFFEXOR) 37.5 MG tablet Take 37.5 mg by mouth daily.   08/30/2022 at 0925   zinc sulfate 220 (50 Zn) MG capsule Take 1 capsule (220 mg total) by mouth daily. 30 capsule 1 08/30/2022 at 0925   atorvastatin (LIPITOR) 40 MG tablet Take 1 tablet (40 mg total) by mouth at bedtime. (Patient not taking: Reported on 08/30/2022) 30 tablet 1 Not Taking   feeding supplement (ENSURE ENLIVE / ENSURE PLUS) LIQD Take 237 mLs by mouth 3 (three) times daily between meals. 237 mL 12    ipratropium-albuterol (DUONEB) 0.5-2.5 (3) MG/3ML SOLN Take 3 mLs by nebulization every 6 (six) hours as needed. 360 mL  prn at prn   ondansetron (ZOFRAN-ODT) 4 MG disintegrating tablet Take 1 tablet (4 mg total) by mouth every 8 (eight) hours as needed for nausea or vomiting. 12 tablet 0 prn at prn   Social History   Socioeconomic History    Marital status: Married    Spouse name: Not on file   Number of children: Not on file   Years of education: Not on file   Highest education level: Not on file  Occupational History   Not on file  Tobacco Use   Smoking status: Never   Smokeless tobacco: Never   Tobacco comments:    social as teenager  Vaping Use   Vaping Use: Never used  Substance and Sexual Activity   Alcohol use: No   Drug use: No   Sexual activity: Not on file  Other Topics Concern   Not on file  Social History Narrative   Not on file   Social Determinants of Health   Financial Resource Strain: Not on file  Food Insecurity: No Food Insecurity (08/16/2022)  Hunger Vital Sign    Worried About Running Out of Food in the Last Year: Never true    Ran Out of Food in the Last Year: Never true  Transportation Needs: No Transportation Needs (08/16/2022)   PRAPARE - Hydrologist (Medical): No    Lack of Transportation (Non-Medical): No  Physical Activity: Not on file  Stress: Not on file  Social Connections: Not on file  Intimate Partner Violence: Not At Risk (08/16/2022)   Humiliation, Afraid, Rape, and Kick questionnaire    Fear of Current or Ex-Partner: No    Emotionally Abused: No    Physically Abused: No    Sexually Abused: No    Family History  Problem Relation Age of Onset   Breast cancer Neg Hx       Review of systems complete and found to be negative unless listed above      PHYSICAL EXAM  General: Well developed, well nourished, in no acute distress HEENT:  Normocephalic and atramatic Neck:  No JVD.  Lungs: Clear bilaterally to auscultation and percussion. Heart: HRRR . Normal S1 and S2 without gallops or murmurs.  Abdomen: Bowel sounds are positive, abdomen soft and non-tender  Msk:  Back normal, normal gait. Normal strength and tone for age. Extremities: No clubbing, cyanosis or edema.   Neuro: Alert and oriented X 3. Psych:  Good affect, responds  appropriately  Labs:   Lab Results  Component Value Date   WBC 6.7 09/06/2022   HGB 9.9 (L) 09/06/2022   HCT 31.9 (L) 09/06/2022   MCV 98.5 09/06/2022   PLT 208 09/06/2022    Recent Labs  Lab 09/09/22 0528  NA 135  K 4.6  CL 100  CO2 23  BUN 49*  CREATININE 0.78  CALCIUM 9.2  PROT 6.2*  BILITOT 2.2*  ALKPHOS 303*  ALT 1,024*  AST 1,838*  GLUCOSE 111*   Lab Results  Component Value Date   CKTOTAL 63 01/12/2013   CKMB < 0.5 (L) 01/12/2013   TROPONINI < 0.02 01/12/2013   No results found for: "CHOL" No results found for: "HDL" No results found for: "LDLCALC" Lab Results  Component Value Date   TRIG 82 07/28/2022   TRIG 58 07/25/2022   TRIG 168 (H) 07/22/2022   No results found for: "CHOLHDL" No results found for: "LDLDIRECT"    Radiology: US Abdomen Limited RUQ (LIVER/GB)  Result Date: 09/07/2022 CLINICAL DATA:  Elevated LFTs EXAM: ULTRASOUND ABDOMEN LIMITED RIGHT UPPER QUADRANT COMPARISON:  CT AP 08/30/22 FINDINGS: Gallbladder: Status post cholecystectomy. Common bile duct: Diameter: 7 mm, which is normal in the setting of cholecystectomy. Liver: No focal lesion identified. Increased echogenicity, as can be seen with hepatic steatosis. Portal vein is patent on color Doppler imaging with normal direction of blood flow towards the liver. Other: Moderate right sided pleural effusion, as seen on CT AP 08/30/22. IMPRESSION: 1. Increased echogenicity of the liver, as can be seen with hepatic steatosis. 2. Moderate right sided pleural effusion, as seen on CT 08/30/22. Electronically Signed   By: Marin Roberts M.D.   On: 09/07/2022 15:03   CT Abdomen Pelvis W Contrast  Result Date: 08/30/2022 CLINICAL DATA:  Worsening abdominal pain. Follow-up abscess. Recent colostomy. Increased drainage from percutaneous drain. Colon cancer. * Tracking Code: BO * EXAM: CT ABDOMEN AND PELVIS WITH CONTRAST TECHNIQUE: Multidetector CT imaging of the abdomen and pelvis was performed using the  standard protocol following bolus administration of intravenous contrast. RADIATION DOSE  REDUCTION: This exam was performed according to the departmental dose-optimization program which includes automated exposure control, adjustment of the mA and/or kV according to patient size and/or use of iterative reconstruction technique. CONTRAST:  131m OMNIPAQUE IOHEXOL 300 MG/ML  SOLN COMPARISON:  08/16/2022 FINDINGS: Lower Chest: No significant change in small to moderate bilateral pleural effusions and dependent atelectasis. Hepatobiliary: No hepatic masses identified. Prior cholecystectomy. No evidence of biliary obstruction. Pancreas:  No mass or inflammatory changes. Spleen: Within normal limits in size and appearance. Adrenals/Urinary Tract: No suspicious masses identified. No evidence of ureteral calculi or hydronephrosis. Stomach/Bowel: Postop changes again seen from sigmoid colon resection with descending colostomy. Evidence of bowel obstruction. Percutaneous drainage catheter remains in place in the left pelvis. A small rim enhancing fluid collection is seen in the left pelvis adjacent to the catheter which measures 3.3 x 2.0 cm and is new since previous study. Several other loculated pelvic fluid collections have resolved since prior exam. A persistent gas and fluid collection is seen in the left iliopsoas region which measures 4.7 by 3.6 cm, without significant change since prior study. Vascular/Lymphatic: No pathologically enlarged lymph nodes. No acute vascular findings. Aortic atherosclerotic calcification incidentally noted. Reproductive: Prior hysterectomy noted. Adnexal regions are unremarkable in appearance. Other:  None. Musculoskeletal:  No suspicious bone lesions identified. IMPRESSION: Postop changes from sigmoid colectomy with descending colostomy. New 3.3 cm rim enhancing fluid collection in left pelvis adjacent to the pigtail drainage catheter. Stable 4.7 cm gas and fluid collection in left  iliopsoas region. Stable small to moderate bilateral pleural effusions and dependent atelectasis. Aortic Atherosclerosis (ICD10-I70.0). Electronically Signed   By: JMarlaine HindM.D.   On: 08/30/2022 13:39   CT CHEST ABDOMEN PELVIS W CONTRAST  Result Date: 08/16/2022 CLINICAL DATA:  Colon cancer, staging EXAM: CT CHEST, ABDOMEN, AND PELVIS WITH CONTRAST TECHNIQUE: Multidetector CT imaging of the chest, abdomen and pelvis was performed following the standard protocol during bolus administration of intravenous contrast. RADIATION DOSE REDUCTION: This exam was performed according to the departmental dose-optimization program which includes automated exposure control, adjustment of the mA and/or kV according to patient size and/or use of iterative reconstruction technique. CONTRAST:  103mOMNIPAQUE IOHEXOL 300 MG/ML  SOLN COMPARISON:  08/13/2022, 08/11/2022 FINDINGS: CT CHEST FINDINGS Cardiovascular: The heart is enlarged without pericardial effusion. No evidence of thoracic aortic aneurysm or dissection. Diffuse atherosclerosis of the aorta and coronary vasculature. Mediastinum/Nodes: No enlarged mediastinal, hilar, or axillary lymph nodes. Thyroid gland, trachea, and esophagus demonstrate no significant findings. Lungs/Pleura: There are small bilateral pleural effusions and dependent lower lobe atelectasis, increased since prior exam. Areas of nodular airspace disease are seen throughout the mid and upper lung zones, likely inflammatory or infectious. Close follow-up is warranted to assess resolution in this patient with a history of colon cancer. No pneumothorax. The central airways are patent. Musculoskeletal: There are no acute or destructive bony lesions. Reconstructed images demonstrate no additional findings. CT ABDOMEN PELVIS FINDINGS Hepatobiliary: No focal liver abnormality is seen. Status post cholecystectomy. No biliary dilatation. Pancreas: Unremarkable. No pancreatic ductal dilatation or surrounding  inflammatory changes. Spleen: Normal in size without focal abnormality. Adrenals/Urinary Tract: Stable 4 mm nonobstructing right renal calculus. Otherwise the kidneys are unremarkable. No obstructive uropathy. Lack of excretion of contrast on delayed imaging with persistent bilateral nephrogram could reflect underlying acute renal insufficiency. Please correlate with laboratory analysis. Bladder is partially decompressed with an indwelling Foley catheter. The adrenals are unremarkable. Stomach/Bowel: Left lower quadrant ostomy again identified, with no evidence  of bowel obstruction or ileus. Stable mural thickening of the colon at the level of the ostomy. Postsurgical changes are seen from sigmoid colon resection. Remaining portions of the distal sigmoid colon and rectum are moderately distended without focal abnormality. Vascular/Lymphatic: Aortic atherosclerosis. No enlarged abdominal or pelvic lymph nodes. Reproductive: Status post hysterectomy. No adnexal masses. Other: There are 2 percutaneous pigtail drainage catheters. The more superior catheter is seen within the left paracolic gutter, with complete resolution of the fluid collection seen previously. The second catheter is placed in the region of a multilocular collection in the pelvis. The largest portion of this multilocular collection is decompressed by the pigtail drainage catheter. However, numerous other smaller collections persist. These collections may be interconnected, and continued follow-up is recommended. Measurements are as follows:: Image 87/2, 5.2 x 3.5 cm. Image 94/2, 4.8 x 4.1 cm. Image 96/2, 4.4 x 1.6 cm. Postsurgical changes are seen from midline laparotomy, with healing anterior abdominal wound. Musculoskeletal: No acute or destructive bony lesions. Reconstructed images demonstrate no additional findings. IMPRESSION: 1. No evidence of metastatic disease. 2. Patchy bilateral nodular airspace disease most consistent with multifocal  pneumonia or inflammation. Continued follow-up is recommended to ensure resolution and exclude metastatic disease. 3. Increased bilateral pleural effusions, with dependent lower lobe atelectasis. 4. Pigtail drainage catheters as above. The multilocular pelvic collection seen on prior study is partially decompressed by the drainage catheter, with remaining likely interconnected fluid collections as above. Continued follow-up is recommended. Complete resolution of the left paracolic collection after pigtail catheter placement. 5. Continued mural thickening of the colon at the ostomy site left lower quadrant, which may reflect postsurgical edema. No bowel obstruction or ileus. 6. Stable nonobstructing right renal calculus. 7.  Aortic Atherosclerosis (ICD10-I70.0). Electronically Signed   By: Randa Ngo M.D.   On: 08/16/2022 19:44   CT GUIDED PERITONEAL/RETROPERITONEAL FLUID DRAIN BY PERC CATH  Result Date: 08/13/2022 CLINICAL DATA:  Status post Hartmann's procedure for diverticulitis of the sigmoid colon on 07/19/2022. Development postoperative abscesses in the left lateral abdomen adjacent to the descending colon and pelvis to the left of midline. EXAM: 1. CT GUIDED PERCUTANEOUS CATHETER DRAINAGE OF LEFT ABDOMINAL PERITONEAL ABSCESS 2. CT-GUIDED PERCUTANEOUS CATHETER DRAINAGE OF PELVIC PERITONEAL ABSCESS ANESTHESIA/SEDATION: Moderate (conscious) sedation was employed during this procedure. A total of Versed 1.0 mg and Fentanyl 50 mcg was administered intravenously by radiology nursing. Moderate Sedation Time: 45 minutes. The patient's level of consciousness and vital signs were monitored continuously by radiology nursing throughout the procedure under my direct supervision. PROCEDURE: The procedure, risks, benefits, and alternatives were explained to the patient. Questions regarding the procedure were encouraged and answered. The patient understands and consents to the procedure. A time out was performed prior  to initiating the procedure. The operative field was prepped with chlorhexidine in a sterile fashion, and a sterile drape was applied covering the operative field. A sterile gown and sterile gloves were used for the procedure. Local anesthesia was provided with 1% Lidocaine. CT was performed through the abdomen and pelvis in a supine position. Separate sites were marked along the left lateral abdominal wall and anterior lower left abdominal wall at the level of the pelvis. A 18 gauge trocar needle was advanced to the level of a left-sided peritoneal abscess located adjacent to the descending colon. After aspiration of fluid from the needle, a guidewire was advanced into the collection and the needle removed. The percutaneous tract was dilated over the wire and a 10 French percutaneous drain advanced over  the wire. Catheter positioning was confirmed by CT. Additional fluid sample was aspirated from the drain and sent for culture analysis. The drainage catheter was flushed with sterile saline and connected to a suction bulb. A new 18 gauge trocar needle was then advanced from an anterior approach to the left of midline to the level of a left pelvic peritoneal abscess. After confirming needle tip position, a guidewire was advanced and the needle removed. The percutaneous tract was dilated over the wire and a 10 French percutaneous drainage catheter advanced. A fluid sample was withdrawn from the drain and sent for culture analysis. The drain was flushed with sterile saline and connected to a suction bulb. Both drainage catheters were secured at the skin with Prolene retention sutures and adhesive retention devices. Dressings were applied at the catheter exit sites. RADIATION DOSE REDUCTION: This exam was performed according to the departmental dose-optimization program which includes automated exposure control, adjustment of the mA and/or kV according to patient size and/or use of iterative reconstruction technique.  COMPLICATIONS: None FINDINGS: Aspiration at the level of the left lateral pericolic abscess yielded grossly purulent fluid. After placement of a drainage catheter within the collection, there is good output of purulent fluid. After drain placement into the irregular cavity containing air and fluid in the left pelvis, there was return initially air followed by bloody fluid and debris. After placement of a drainage catheter within this collection there is output of bloody fluid. IMPRESSION: 1. CT-guided percutaneous catheter drainage of left lateral abdominal abscess yielding purulent fluid. A fluid sample was sent for culture analysis. A 10 French percutaneous drainage catheter was placed and attached to suction bulb drainage. 2. CT-guided percutaneous catheter drainage of pelvic abscess yielding bloody fluid and debris. A fluid sample was sent for culture analysis. A 10 French percutaneous drainage catheter was placed and attached to suction bulb drainage. Electronically Signed   By: Aletta Edouard M.D.   On: 08/13/2022 14:19   CT GUIDED PERITONEAL/RETROPERITONEAL FLUID DRAIN BY PERC CATH  Result Date: 08/13/2022 CLINICAL DATA:  Status post Hartmann's procedure for diverticulitis of the sigmoid colon on 07/19/2022. Development postoperative abscesses in the left lateral abdomen adjacent to the descending colon and pelvis to the left of midline. EXAM: 1. CT GUIDED PERCUTANEOUS CATHETER DRAINAGE OF LEFT ABDOMINAL PERITONEAL ABSCESS 2. CT-GUIDED PERCUTANEOUS CATHETER DRAINAGE OF PELVIC PERITONEAL ABSCESS ANESTHESIA/SEDATION: Moderate (conscious) sedation was employed during this procedure. A total of Versed 1.0 mg and Fentanyl 50 mcg was administered intravenously by radiology nursing. Moderate Sedation Time: 45 minutes. The patient's level of consciousness and vital signs were monitored continuously by radiology nursing throughout the procedure under my direct supervision. PROCEDURE: The procedure, risks,  benefits, and alternatives were explained to the patient. Questions regarding the procedure were encouraged and answered. The patient understands and consents to the procedure. A time out was performed prior to initiating the procedure. The operative field was prepped with chlorhexidine in a sterile fashion, and a sterile drape was applied covering the operative field. A sterile gown and sterile gloves were used for the procedure. Local anesthesia was provided with 1% Lidocaine. CT was performed through the abdomen and pelvis in a supine position. Separate sites were marked along the left lateral abdominal wall and anterior lower left abdominal wall at the level of the pelvis. A 18 gauge trocar needle was advanced to the level of a left-sided peritoneal abscess located adjacent to the descending colon. After aspiration of fluid from the needle, a guidewire was advanced  into the collection and the needle removed. The percutaneous tract was dilated over the wire and a 10 French percutaneous drain advanced over the wire. Catheter positioning was confirmed by CT. Additional fluid sample was aspirated from the drain and sent for culture analysis. The drainage catheter was flushed with sterile saline and connected to a suction bulb. A new 18 gauge trocar needle was then advanced from an anterior approach to the left of midline to the level of a left pelvic peritoneal abscess. After confirming needle tip position, a guidewire was advanced and the needle removed. The percutaneous tract was dilated over the wire and a 10 French percutaneous drainage catheter advanced. A fluid sample was withdrawn from the drain and sent for culture analysis. The drain was flushed with sterile saline and connected to a suction bulb. Both drainage catheters were secured at the skin with Prolene retention sutures and adhesive retention devices. Dressings were applied at the catheter exit sites. RADIATION DOSE REDUCTION: This exam was performed  according to the departmental dose-optimization program which includes automated exposure control, adjustment of the mA and/or kV according to patient size and/or use of iterative reconstruction technique. COMPLICATIONS: None FINDINGS: Aspiration at the level of the left lateral pericolic abscess yielded grossly purulent fluid. After placement of a drainage catheter within the collection, there is good output of purulent fluid. After drain placement into the irregular cavity containing air and fluid in the left pelvis, there was return initially air followed by bloody fluid and debris. After placement of a drainage catheter within this collection there is output of bloody fluid. IMPRESSION: 1. CT-guided percutaneous catheter drainage of left lateral abdominal abscess yielding purulent fluid. A fluid sample was sent for culture analysis. A 10 French percutaneous drainage catheter was placed and attached to suction bulb drainage. 2. CT-guided percutaneous catheter drainage of pelvic abscess yielding bloody fluid and debris. A fluid sample was sent for culture analysis. A 10 French percutaneous drainage catheter was placed and attached to suction bulb drainage. Electronically Signed   By: Aletta Edouard M.D.   On: 08/13/2022 14:19   CT HEAD WO CONTRAST (5MM)  Result Date: 08/12/2022 CLINICAL DATA:  Altered mental status EXAM: CT HEAD WITHOUT CONTRAST TECHNIQUE: Contiguous axial images were obtained from the base of the skull through the vertex without intravenous contrast. RADIATION DOSE REDUCTION: This exam was performed according to the departmental dose-optimization program which includes automated exposure control, adjustment of the mA and/or kV according to patient size and/or use of iterative reconstruction technique. COMPARISON:  09/16/2017 FINDINGS: Brain: No acute intracranial findings are seen. There are no signs of bleeding within the cranium. Cortical sulci are prominent. Ventricles are not dilated.  There is subtle decreased density in periventricular white matter and basal ganglia. Vascular: There are scattered arterial calcifications. Skull: Unremarkable. Sinuses/Orbits: Defects in the medial walls of both maxillary sinuses may be postsurgical. Other: None. IMPRESSION: No acute intracranial findings are seen.  Atrophy. Electronically Signed   By: Elmer Picker M.D.   On: 08/12/2022 17:57   CT ANGIO GI BLEED  Result Date: 08/11/2022 CLINICAL DATA:  Lower GI bleeding. Bright red blood per rectum. Colostomy. EXAM: CTA ABDOMEN AND PELVIS WITHOUT AND WITH CONTRAST TECHNIQUE: Multidetector CT imaging of the abdomen and pelvis was performed using the standard protocol during bolus administration of intravenous contrast. Multiplanar reconstructed images and MIPs were obtained and reviewed to evaluate the vascular anatomy. RADIATION DOSE REDUCTION: This exam was performed according to the departmental dose-optimization program which includes  automated exposure control, adjustment of the mA and/or kV according to patient size and/or use of iterative reconstruction technique. CONTRAST:  72m OMNIPAQUE IOHEXOL 350 MG/ML SOLN COMPARISON:  CT abdomen and pelvis 06/14/2022 FINDINGS: VASCULAR Aorta: Normal caliber aorta without aneurysm, dissection, vasculitis or significant stenosis. Moderate calcified atherosclerotic disease seen throughout the aorta. Celiac: Patent without evidence of aneurysm, dissection, vasculitis or significant stenosis. SMA: Patent without evidence of aneurysm, dissection, vasculitis or significant stenosis. Renals: Both renal arteries are patent without evidence of aneurysm, dissection, vasculitis, fibromuscular dysplasia or significant stenosis. Accessory left renal artery present. 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 Artery is otherwise widely patent. Inflow: Patent without evidence of aneurysm, dissection,  vasculitis or significant stenosis. Proximal Outflow: Bilateral common femoral and visualized portions of the superficial and profunda femoral arteries are patent without evidence of aneurysm, dissection, vasculitis or significant stenosis. Veins: No obvious venous abnormality within the limitations of this arterial phase study. Review of the MIP images confirms the above findings. NON-VASCULAR Lower chest: There are small bilateral pleural effusions with compressive atelectasis in the lung bases, left greater than right. Hepatobiliary: No focal liver abnormality is seen. Status post cholecystectomy. No biliary dilatation. Pancreas: Unremarkable. No pancreatic ductal dilatation or surrounding inflammatory changes. Spleen: Normal in size without focal abnormality. Adrenals/Urinary Tract: The bladder is markedly distended. There is mild bilateral hydronephrosis without obstructing calculi identified. No perinephric stranding or fluid collection. Single 4 mm nonobstructing right renal calculus. Adrenal glands are within normal limits. Stomach/Bowel: There is a new left lower quadrant colostomy. The colon proximal to the colostomy demonstrates mild wall thickening (descending colon) without pneumatosis. Small bowel and stomach are within normal limits. The distal sigmoid colon and rectum are fluid distended, but otherwise unremarkable. There are several air-fluid collections in the pelvis which may or may not be bowel loops. Evaluation is limited secondary to lack of oral contrast in this region. The largest air-fluid collection is in the left lower quadrant measuring 6.8 x 5.6 by 5.2 cm (image 10/172). This region is suspicious for abscess and abuts the sigmoid colon staple line image 7/176. There is a new enhancing air-fluid collection in the left paracolic gutter worrisome for abscess measuring 3.3 by 3.7 by 10.8 cm image 10/104. No active gastrointestinal bleeding identified. Lymphatic: No enlarged lymph nodes are  seen. Reproductive: Uterus is not visualized. Other: There is no ascites. There is new diffuse body wall edema. Small fat containing umbilical hernia is again seen. There is a new midline abdominal wall wound without discrete focal fluid collection. There is a new left-sided ostomy which appears within normal limits. No free intraperitoneal air. Musculoskeletal: Degenerative changes affect the spine. IMPRESSION: VASCULAR 1. No active gastrointestinal bleeding identified. 2. No evidence for acute arterial abnormality. 3.  Aortic Atherosclerosis (ICD10-I70.0). NON-VASCULAR 1. New left lower quadrant colostomy present. No evidence for bowel obstruction. Mild wall thickening of the descending colon proximal to the ostomy may represent nonspecific colitis. 2. New air-fluid collection in the left paracolic gutter worrisome for abscess measuring 3.3 x 3.7 x 10.8 cm. 3. Findings suspicious for air-fluid collection abutting the sigmoid colon staple line measuring 6.8 x 5.6 x 5.2 cm. Findings may represent bowel leak and/or abscess. 4. Mild bilateral hydronephrosis likely secondary to markedly distended bladder. Recommend clinical correlation and follow-up. 5. Diffuse body wall edema. 6. Nonobstructing right renal calculus. 7. Small pleural effusions. Electronically Signed   By: ARonney AstersM.D.   On:  08/11/2022 16:20    EKG:   ASSESSMENT AND PLAN:  Liver failure/elevated LFTs Ischemic cardiomyopathy Atrial fibrillation Colon cancer Diverticulitis Postop bladder surgery Multivessel coronary disease Congestive heart failure Malnutrition Diabetes . Plan Discontinue amiodarone SNF contributing to elevated LFTs Continue metoprolol therapy for rate control for atrial fibrillation Aggressive nutritional supplementation Agree with postop management and care for bladder surgery Continue diabetes management  Agree with treatment for heart failure with conservative management No invasive cardiac procedures  recommended  Signed: Yolonda Kida MD 09/09/2022, 1:52 PM

## 2022-09-09 NOTE — Telephone Encounter (Signed)
Patient's sister was unhappy when I told her that she would have to call scheduling to change the appointment & that I was told she would have to be discharged before having the CT Scan. Patient's sister demanded to speak to [facility]. I told her he is seeing patients right now and her response was "well she is one of his patients."

## 2022-09-09 NOTE — Progress Notes (Signed)
PROGRESS NOTE  Sarah [name]  DOB: [birth year]  PCP: Rusty Aus, MD QIW:[id number]  DOA: 08/30/2022  LOS: 10 days  Hospital Day: 11  Brief narrative: Sarah [name] is a 76 y.o. female with PMH significant for DM2, HTN, HLD, chronic systolic CHF, thyroid cancer, hypothyroidism, recent history of diverticulitis with abscess formation  In July 2023, patient was diagnosed with diverticulitis and abscess.  Underwent IR guided drain placement discharged with symptoms did not improve. 07/19/2022, underwent scheduled laparoscopic sigmoidectomy and Hartman's procedure.  Pathology showed invasive colorectal adenocarcinoma, margins negative, nodes negative. Postop complications, patient developed rapid atrial fibrillation, hypotension and non-STEMI with heart failure and ejection fraction now of 25%.  Also found to have extensive CAD, but not felt to be candidate for CABG given comorbidities.   9/4, she was discharged to a SNF. 9/14, readmitted for Pseudomonas infection midline open wound.  Also found to have intra-abdominal abscess, drained potentially by IR.  During that hospitalization, patient developed urinary retention and was discharged on Foley catheter. 9/28, Foley catheter was removed at urologist office in the morning. However per documentation, she was not brought back in the afternoon from SNF for follow-up imaging.  It is not clear if patient was able to void after Foley catheter was removed.  10/3, patient was sent back from SNF for increased JP drainage.  CT scan showed bladder distention.  After Foley catheter was placed, there was marked decrease in output from her JP drain.  The fluid sample obtained from around JP fluid noted a creatinine of 21.6 indicated of urine. 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 Patient was admitted to [facility] service  Over the course of the next several days, patient showed signs of improvement and plan was to  discharge her back to skilled nursing however labs done on morning of 10/10 noted significantly elevated transaminases with AST of 786 and ALT of 442.  Previously normal LFTs.  GI consulted with consideration for possibly drug-induced hepatitis.  Viral hepatitis panel unremarkable.  See below for details.  Subjective: Patient was seen and examined this afternoon.  Pleasant elderly Caucasian female.  Lying on bed.  Not in distress.  Feels weak. In the last 24 hours, patient is afebrile, hemodynamically stable, breathing on room air Labs this morning with improving alk phos but further elevated AST, ALT and total bilirubin as well as significant elevated BNP to more than 4500 and INR to 3.4  Assessment and plan: Elevated LFTs LFTs trend as below which shows an worsening pattern of AST and ALT as well as bilirubin and INR. Acute hepatitis panel nonreactive 10/11, ultrasound right upper quadrant showed increased echogenicity of the liver suggestive of hepatic steatosis. Suspicion at this time is hepatic congestion due to systolic CHF.  Amiodarone may also have a role.  Started after discussion with cardiology. GI following Recent Labs  Lab 09/06/22 0438 09/07/22 0619 09/08/22 0458 09/09/22 0528  AST 786* 799* 1,298* 1,838*  ALT 442* 523* 767* 1,024*  ALKPHOS 212* 302* 316* 303*  BILITOT 0.9 1.1 1.6* 2.2*  PROT 6.2* 6.4* 6.6 6.2*  ALBUMIN 2.5* 2.6* 2.7* 2.6*  INR  --   --   --  3.4*  PLT 208  --   --   --    Acute on chronic systolic CHF During the last hospitalization 8/22-9/4, patient was noted to have EF of 25 to 30%. [place] 9/1 showed moderate to severe dilatation of LV chamber.  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 10/12, BNP significantly elevated to over 4500.   PTA on metoprolol 25 mg 3 times daily, Lasix 40 mg daily, lisinopril 2.5 mg daily, Aldactone 12.5 mg daily, Currently getting Lasix 20 mg IV twice  daily. Continue metoprolol as well.  Lisinopril and Aldactone on hold. Net IO Since Admission: -936.57 mL [09/09/22 1606] Continue to monitor for daily intake output, weight, blood pressure, BNP, renal function and electrolytes. Recent Labs  Lab 09/05/22 0456 09/06/22 0438 09/07/22 0619 09/08/22 0458 09/08/22 1951 09/09/22 0528  BNP  --   --   --   --  >4,500.0*  --   BUN 36* 37* 42* 41*  --  49*  CREATININE 0.82 0.72 0.82 0.83  --  0.78  K 5.1 4.8 5.1 4.7  --  4.6   Bladder fistula Acute urinary retention Per urology recommendation, to maintain Foley catheter to allow decompression and healing of the fistula.   Uncontrolled type 2 diabetes mellitus A1c 8.3 on 05/30/2022 PTA on glimepiride 4 mg daily.  Currently on hold. Currently on sliding scale insulin with Accu-Cheks Recent Labs  Lab 09/08/22 1145 09/08/22 1703 09/08/22 2133 09/09/22 0757 09/09/22 1138  GLUCAP 94 77 96 118* 146*   Extensive CAD Not amenable for CABG per [facility] 9/1 Continue Eliquis, aspirin.  Statin on hold  Atrial fibrillation with RVR Rate controlled now on low pressure. Amiodarone was stopped by cardiology if contributing factor to her transaminitis.  Continue Eliquis for anticoagulation   Protein-calorie malnutrition, severe Nutrition to see  Chronic anemia Last hemoglobin was 9.9 on 10/10.  Continue to monitor.  No active bleeding. Recent Labs    07/23/22 0244 07/23/22 0820 07/24/22 0109 08/30/22 1153 08/31/22 0530 09/01/22 0516 09/02/22 0500 09/06/22 0438  HGB 7.7*  --    < > 11.4* 9.6* 8.7* 9.1* 9.9*  MCV 86.6  --    < > 96.1 97.8 96.9 97.7 98.5  VITAMINB12  --  1,446*  --   --   --   --   --   --   FERRITIN 191  --   --   --   --   --   --   --   TIBC 134*  --   --   --   --   --   --   --   IRON 33  --   --   --   --   --   --   --    < > = values in this interval not displayed.   Hypothyroidism Continue Synthroid   Goals of care   Code Status: DNR    Mobility: Encourage  to participate with PT  Skin assessment:  Pressure Injury 07/21/22 Buttocks Right Unstageable - Full thickness tissue loss in which the base of the injury is covered by slough (yellow, tan, gray, green or brown) and/or eschar (tan, brown or black) in the wound bed. (Active)  07/21/22 1400  Location: Buttocks  Location Orientation: Right  Staging: Unstageable - Full thickness tissue loss in which the base of the injury is covered by slough (yellow, tan, gray, green or brown) and/or eschar (tan, brown or black) in the wound bed.  Wound Description (Comments):   Present on Admission: Yes (patient states she thinks she has had this wound from sitting in her recliner at home.)    Nutritional status:  There is no height or weight on file to  calculate BMI.          Diet:  Diet Order             Diet Carb Modified Fluid consistency: Thin; Room service appropriate? Yes  Diet effective now                   DVT prophylaxis:  apixaban (ELIQUIS) tablet 2.5 mg Start: 08/30/22 2215 SCDs Start: 08/30/22 2212 apixaban (ELIQUIS) tablet 2.5 mg   Antimicrobials: None Fluid: None Consultants: GI Family Communication: None at bedside  Status is: Inpatient  Continue in-hospital care because: Pending work-up for elevated LFTs Level of care: Med-Surg   Dispo: The patient is from: SNF              Anticipated d/c is to: Back to SNF              Patient currently is not medically stable to d/c.   Difficult to place patient No     Infusions:    Scheduled Meds:  apixaban  2.5 mg Oral BID   aspirin  81 mg Oral Daily   Chlorhexidine Gluconate Cloth  6 each Topical Daily   feeding supplement  237 mL Oral TID BM   furosemide  20 mg Intravenous BID   insulin aspart  0-9 Units Subcutaneous TID WC   leptospermum manuka honey  1 Application Topical Daily   levothyroxine  137 mcg Oral Q0600   metoprolol tartrate  25 mg Oral TID   sertraline  100 mg Oral Daily   venlafaxine  37.5 mg  Oral Daily    PRN meds: docusate sodium, ipratropium, ondansetron **OR** ondansetron (ZOFRAN) IV, mouth rinse, traMADol   Antimicrobials: Anti-infectives (From admission, onward)    Start     Dose/Rate Route Frequency Ordered Stop   09/05/22 0000  ciprofloxacin (CIPRO) 500 MG tablet  Status:  Discontinued        500 mg Oral 2 times daily 09/05/22 0906 09/06/22    09/04/22 1200  ciprofloxacin (CIPRO) tablet 500 mg  Status:  Discontinued        500 mg Oral 2 times daily 09/04/22 1111 09/06/22 0756   08/30/22 2215  ciprofloxacin (CIPRO) IVPB 400 mg  Status:  Discontinued        400 mg 200 mL/hr over 60 Minutes Intravenous Every 12 hours 08/30/22 2214 09/01/22 1138   08/30/22 2215  metroNIDAZOLE (FLAGYL) IVPB 500 mg  Status:  Discontinued        500 mg 100 mL/hr over 60 Minutes Intravenous Every 12 hours 08/30/22 2214 09/01/22 1138   08/30/22 1530  ceFEPIme (MAXIPIME) 2 g in sodium chloride 0.9 % 100 mL IVPB        2 g 200 mL/hr over 30 Minutes Intravenous  Once 08/30/22 1515 08/30/22 1647   08/30/22 1530  metroNIDAZOLE (FLAGYL) IVPB 500 mg        500 mg 100 mL/hr over 60 Minutes Intravenous  Once 08/30/22 1515 08/30/22 1812       Objective: Vitals:   09/09/22 0757 09/09/22 1537  BP: 101/61 107/63  Pulse: 69 65  Resp: 17 17  Temp: 97.6 F (36.4 C)   SpO2: 98% 97%    Intake/Output Summary (Last 24 hours) at 09/09/2022 1606 Last data filed at 09/09/2022 1439 Gross per 24 hour  Intake 600 ml  Output 715 ml  Net -115 ml   There were no vitals filed for this visit. Weight change:  There is no height or  weight on file to calculate BMI.   Physical Exam: General exam: Pleasant, elderly Caucasian female.  Not in physical distress.  Feels weak Skin: No rashes, lesions or ulcers. HEENT: Atraumatic, normocephalic, no obvious bleeding Lungs: Clear to auscultation bilaterally CVS: Regular rate and rhythm, no murmur GI/Abd soft, nontender, nondistended, bowel sound  present CNS: Alert, awake, oriented x3.  Slow to respond Psychiatry: Looks anxious Extremities: No pedal edema, no calf tenderness  Data Review: I have personally reviewed the laboratory data and studies available.  F/u labs ordered Unresulted Labs (From admission, onward)     Start     Ordered   09/09/22 1136  ANA w/Reflex  Once,   R       Question:  Specimen collection method  Answer:  Lab=Lab collect   09/09/22 1136   09/08/22 1137  HSV DNA by PCR (reference lab) Blood  Once,   R        09/08/22 1136   09/08/22 1137  Epstein-Barr virus VCA, IgM  Once,   R       Question:  Specimen collection method  Answer:  Lab=Lab collect   09/08/22 1136   09/08/22 0500  Comprehensive metabolic panel  Daily at 5am,   R     Question:  Specimen collection method  Answer:  Lab=Lab collect   09/07/22 1838   09/07/22 0500  Comprehensive metabolic panel  Daily at 5am,   R     Question:  Specimen collection method  Answer:  Lab=Lab collect   09/06/22 1656            Signed, Terrilee Croak, MD [facility] 09/09/2022

## 2022-09-09 NOTE — TOC Progression Note (Signed)
Transition of Care [facility]) - Progression Note    Patient Details  Name: Sarah [name] MRN: [id number] Date of Birth: 1946-05-07  Transition [facility] Care [facility]) CM/SW Rose, LCSW Phone Number: 09/09/2022, 10:53 AM  Clinical Narrative:    Per MD, patient not medically ready to return to [facility] yet. Updated sister Judson Roch by phone. TOC will continue to follow.         Expected Discharge Plan and Services                                                 Social Determinants of Health (SDOH) Interventions    Readmission Risk Interventions    08/14/2022    3:26 PM 07/28/2022    3:27 PM  Readmission Risk Prevention Plan  Transportation Screening Complete Complete  PCP or Specialist Appt within 3-5 Days  Complete  HRI or [place]  Complete  Social Work Consult for [place] Planning/Counseling  Complete  Palliative Care Screening  Complete  Medication Review [employer] photographer) Complete Complete  PCP or Specialist appointment within 3-5 days of discharge Complete   HRI or [place] Complete   SW Recovery Care/Counseling Consult Complete   [place] Not Applicable

## 2022-09-09 NOTE — Telephone Encounter (Signed)
Patient's sister was wondering if patient could go ahead and have the CT SCAN done while in hospital instead of leaving & going home then having to go back?

## 2022-09-10 DIAGNOSIS — N322 Vesical fistula, not elsewhere classified: Secondary | ICD-10-CM | POA: Diagnosis not present

## 2022-09-10 LAB — COMPREHENSIVE METABOLIC PANEL
ALT: 991 U/L — ABNORMAL HIGH (ref 0–44)
AST: 1512 U/L — ABNORMAL HIGH (ref 15–41)
Albumin: 2.3 g/dL — ABNORMAL LOW (ref 3.5–5.0)
Alkaline Phosphatase: 280 U/L — ABNORMAL HIGH (ref 38–126)
Anion gap: 7 (ref 5–15)
BUN: 48 mg/dL — ABNORMAL HIGH (ref 8–23)
CO2: 27 mmol/L (ref 22–32)
Calcium: 8.5 mg/dL — ABNORMAL LOW (ref 8.9–10.3)
Chloride: 101 mmol/L (ref 98–111)
Creatinine, Ser: 0.69 mg/dL (ref 0.44–1.00)
GFR, Estimated: >60 mL/min (ref >60)
Glucose, Bld: 91 mg/dL (ref 70–99)
Potassium: 3.5 mmol/L (ref 3.5–5.1)
Sodium: 135 mmol/L (ref 135–145)
Total Bilirubin: 1.6 mg/dL — ABNORMAL HIGH (ref 0.3–1.2)
Total Protein: 5.7 g/dL — ABNORMAL LOW (ref 6.5–8.1)

## 2022-09-10 LAB — GLUCOSE, CAPILLARY
Glucose-Capillary: 81 mg/dL (ref 70–99)
Glucose-Capillary: 143 mg/dL — ABNORMAL HIGH (ref 70–99)
Glucose-Capillary: 220 mg/dL — ABNORMAL HIGH (ref 70–99)
Glucose-Capillary: 176 mg/dL — ABNORMAL HIGH (ref 70–99)

## 2022-09-10 NOTE — Progress Notes (Signed)
[facility] Cardiology    SUBJECTIVE: Patient feels weak lethargic denies any chest pain no worsening shortness of breath resting comfortably in bed   Vitals:   09/09/22 1537 09/09/22 1951 09/10/22 0514 09/10/22 0802  BP: 107/63 112/68 111/64 109/62  Pulse: 65 65 (!) 59 62  Resp: '17 16 16   '$ Temp:  97.6 F (36.4 C) (!) 97.5 F (36.4 C)   TempSrc:  Oral Oral   SpO2: 97% 98% 99% 97%     Intake/Output Summary (Last 24 hours) at 09/10/2022 1438 Last data filed at 09/09/2022 1955 Gross per 24 hour  Intake 0 ml  Output 1050 ml  Net -1050 ml      PHYSICAL EXAM  General: Well developed, well nourished, in no acute distress HEENT:  Normocephalic and atramatic Neck:  No JVD.  Lungs: Clear bilaterally to auscultation and percussion. Heart: HRRR . Normal S1 and S2 without gallops or murmurs.  Abdomen: Bowel sounds are positive, abdomen soft and non-tender  Msk:  Back normal, normal gait. Normal strength and tone for age. Extremities: No clubbing, cyanosis or edema.   Neuro: Alert and oriented X 3. Psych:  Good affect, responds appropriately   LABS: Basic Metabolic Panel: Recent Labs    09/09/22 0528 09/10/22 0642  NA 135 135  K 4.6 3.5  CL 100 101  CO2 23 27  GLUCOSE 111* 91  BUN 49* 48*  CREATININE 0.78 0.69  CALCIUM 9.2 8.5*   Liver Function Tests: Recent Labs    09/09/22 0528 09/10/22 0642  AST 1,838* 1,512*  ALT 1,024* 991*  ALKPHOS 303* 280*  BILITOT 2.2* 1.6*  PROT 6.2* 5.7*  ALBUMIN 2.6* 2.3*   No results for input(s): "LIPASE", "AMYLASE" in the last 72 hours. CBC: No results for input(s): "WBC", "NEUTROABS", "HGB", "HCT", "MCV", "PLT" in the last 72 hours. Cardiac Enzymes: No results for input(s): "CKTOTAL", "CKMB", "CKMBINDEX", "TROPONINI" in the last 72 hours. BNP: Invalid input(s): "POCBNP" D-Dimer: No results for input(s): "DDIMER" in the last 72 hours. Hemoglobin A1C: No results for input(s): "HGBA1C" in the last 72 hours. Fasting Lipid  Panel: No results for input(s): "CHOL", "HDL", "LDLCALC", "TRIG", "CHOLHDL", "LDLDIRECT" in the last 72 hours. Thyroid Function Tests: No results for input(s): "TSH", "T4TOTAL", "T3FREE", "THYROIDAB" in the last 72 hours.  Invalid input(s): "FREET3" Anemia Panel: No results for input(s): "VITAMINB12", "FOLATE", "FERRITIN", "TIBC", "IRON", "RETICCTPCT" in the last 72 hours.  No results found.   Echo previous records moderate reduced left ventricular function around 35%  TELEMETRY: :  ASSESSMENT AND PLAN:  Principal Problem:   Bladder fistula Active Problems:   Protein-calorie malnutrition, severe   Atrial fibrillation with RVR (HCC)   Uncontrolled type 2 diabetes mellitus with hyperglycemia, without long-term current use of insulin (HCC)   Hypothyroidism   Elevated LFTs   Acute urinary retention   Acute hepatitis    Plan Ischemic cardiomyopathy continue current conservative management Atrial fibrillation rate controlled poor anticoagulation candidate because of anemia and bleeding Agree with diabetes management and control Increase LFTs seem to have peaked and on the way back down unclear etiology doubt shock liver from heart failure Postop bladder fistula surgery appears to be doing reasonably well History of abdominal cancer appears to be colon cancer work-up and management as per oncology Aggressive nutrition support   Yolonda Kida, MD 09/10/2022 2:38 PM

## 2022-09-10 NOTE — Progress Notes (Signed)
PROGRESS NOTE  Sarah [name]  DOB: August 01, 1946  PCP: Rusty Aus, MD ZOX:[id number]  DOA: 08/30/2022  LOS: 40 days  Hospital Day: 12  Brief narrative: Sarah [name] is a 76 y.o. female with PMH significant for DM2, HTN, HLD, chronic systolic CHF, thyroid cancer, hypothyroidism, recent history of diverticulitis with abscess formation  In July 2023, patient was diagnosed with diverticulitis and abscess.  Underwent IR guided drain placement discharged with symptoms did not improve. 07/19/2022, underwent scheduled laparoscopic sigmoidectomy and Hartman's procedure.  Pathology showed invasive colorectal adenocarcinoma, margins negative, nodes negative. Postop complications, patient developed rapid atrial fibrillation, hypotension and non-STEMI with heart failure and ejection fraction now of 25%.  Also found to have extensive CAD, but not felt to be candidate for CABG given comorbidities.   9/4, she was discharged to a SNF. 9/14, readmitted for Pseudomonas infection midline open wound.  Also found to have intra-abdominal abscess, drained potentially by IR.  During that hospitalization, patient developed urinary retention and was discharged on Foley catheter. 9/28, Foley catheter was removed at urologist office in the morning. However per documentation, she was not brought back in the afternoon from SNF for follow-up imaging.  It is not clear if patient was able to void after Foley catheter was removed.  10/3, patient was sent back from SNF for increased JP drainage.  CT scan showed bladder distention.  After Foley catheter was placed, there was marked decrease in output from her JP drain.  The fluid sample obtained from around JP fluid noted a creatinine of 21.6 indicated of urine. 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 Patient was admitted to [facility] service  Over the course of the next several days, patient showed signs of improvement and plan was to  discharge her back to skilled nursing however labs done on morning of 10/10 noted significantly elevated transaminases with AST of 786 and ALT of 442.  Previously normal LFTs.  GI consulted with consideration for possibly drug-induced hepatitis.  Viral hepatitis panel unremarkable.  See below for details.  Subjective: Patient was seen and examined this afternoon.  Pleasant elderly Caucasian female.  Lying on bed.  Not in distress. Foley catheter with clear urine. Labs from this morning showed gradually improving liver enzymes.  Assessment and plan: Elevated LFTs LFTs trend as below which shows an worsening pattern of AST and ALT as well as bilirubin and INR. Acute hepatitis panel nonreactive 10/11, ultrasound right upper quadrant showed increased echogenicity of the liver suggestive of hepatic steatosis. Suspicion at this time is hepatic congestion due to systolic CHF.  Amiodarone may also have a role.  Stopped after discussion with cardiology. Labs from this morning show gradually improving liver enzyme level. GI following Recent Labs  Lab 09/06/22 0438 09/07/22 0619 09/08/22 0458 09/09/22 0528 09/10/22 0642  AST 786* 799* 1,298* 1,838* 1,512*  ALT 442* 523* 767* 1,024* 991*  ALKPHOS 212* 302* 316* 303* 280*  BILITOT 0.9 1.1 1.6* 2.2* 1.6*  PROT 6.2* 6.4* 6.6 6.2* 5.7*  ALBUMIN 2.5* 2.6* 2.7* 2.6* 2.3*  INR  --   --   --  3.4*  --   PLT 208  --   --   --   --    Acute on chronic systolic CHF During the last hospitalization 8/22-9/4, patient was noted to have EF of 25 to 30%. [place] 9/1 showed moderate to severe dilatation of LV chamber.  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 10/12, BNP significantly elevated to over 4500.   PTA on metoprolol 25 mg 3 times daily, Lasix 40 mg daily, lisinopril 2.5 mg daily, Aldactone 12.5 mg daily, Currently getting Lasix 20 mg IV twice daily.  Looks clinically dry.  BUN/creatinine  ratio elevated.  I stopped IV Lasix this morning. Continue metoprolol as well.  Lisinopril and Aldactone on hold. Net IO Since Admission: -1,986.57 mL [09/10/22 1332] Continue to monitor for daily intake output, weight, blood pressure, BNP, renal function and electrolytes. Recent Labs  Lab 09/06/22 0438 09/07/22 0619 09/08/22 0458 09/08/22 1951 09/09/22 0528 09/10/22 0642  BNP  --   --   --  >4,500.0*  --   --   BUN 37* 42* 41*  --  49* 48*  CREATININE 0.72 0.82 0.83  --  0.78 0.69  K 4.8 5.1 4.7  --  4.6 3.5   Bladder fistula Acute urinary retention Per urology recommendation, to maintain Foley catheter to allow decompression and healing of the fistula.   Uncontrolled type 2 diabetes mellitus A1c 8.3 on 05/30/2022 PTA on glimepiride 4 mg daily.  Currently on hold. Currently on sliding scale insulin with Accu-Cheks Recent Labs  Lab 09/09/22 1138 09/09/22 1705 09/09/22 2238 09/10/22 0803 09/10/22 1144  GLUCAP 146* 164* 144* 81 143*   Extensive CAD Not amenable for CABG per [facility] 9/1 Continue Eliquis, aspirin.  Statin on hold  Atrial fibrillation with RVR Rate controlled now on low pressure. Amiodarone was stopped by cardiology as it could be the contributing factor to her transaminitis.  Continue Eliquis for anticoagulation   Protein-calorie malnutrition, severe Nutrition evaluation  Chronic anemia Last hemoglobin was 9.9 on 10/10.  Continue to monitor.  No active bleeding. Recent Labs    07/23/22 0244 07/23/22 0820 07/24/22 0109 08/30/22 1153 08/31/22 0530 09/01/22 0516 09/02/22 0500 09/06/22 0438  HGB 7.7*  --    < > 11.4* 9.6* 8.7* 9.1* 9.9*  MCV 86.6  --    < > 96.1 97.8 96.9 97.7 98.5  VITAMINB12  --  1,446*  --   --   --   --   --   --   FERRITIN 191  --   --   --   --   --   --   --   TIBC 134*  --   --   --   --   --   --   --   IRON 33  --   --   --   --   --   --   --    < > = values in this interval not displayed.   Hypothyroidism Continue  Synthroid   Goals of care   Code Status: DNR    Mobility: Encourage to participate with PT  Skin assessment:  Pressure Injury 07/21/22 Buttocks Right Unstageable - Full thickness tissue loss in which the base of the injury is covered by slough (yellow, tan, gray, green or brown) and/or eschar (tan, brown or black) in the wound bed. (Active)  07/21/22 1400  Location: Buttocks  Location Orientation: Right  Staging: Unstageable - Full thickness tissue loss in which the base of the injury is covered by slough (yellow, tan, gray, green or brown) and/or eschar (tan, brown or black) in the wound bed.  Wound Description (Comments):   Present on Admission: Yes (patient states she thinks she has had this wound from sitting in her recliner at home.)    Nutritional status:  There is no height or weight on file to calculate BMI.          Diet:  Diet Order             Diet Carb Modified Fluid consistency: Thin; Room service appropriate? Yes  Diet effective now                   DVT prophylaxis:  apixaban (ELIQUIS) tablet 2.5 mg Start: 08/30/22 2215 SCDs Start: 08/30/22 2212 apixaban (ELIQUIS) tablet 2.5 mg   Antimicrobials: None Fluid: None Consultants: GI Family Communication: None at bedside  Status is: Inpatient  Continue in-hospital care because: Pending work-up for elevated LFTs Level of care: Med-Surg   Dispo: The patient is from: SNF              Anticipated d/c is to: Back to SNF              Patient currently is not medically stable to d/c.   Difficult to place patient No     Infusions:    Scheduled Meds:  apixaban  2.5 mg Oral BID   aspirin  81 mg Oral Daily   Chlorhexidine Gluconate Cloth  6 each Topical Daily   feeding supplement  237 mL Oral TID BM   insulin aspart  0-9 Units Subcutaneous TID WC   leptospermum manuka honey  1 Application Topical Daily   levothyroxine  137 mcg Oral Q0600   metoprolol tartrate  25 mg Oral TID   sertraline  100 mg  Oral Daily   venlafaxine  37.5 mg Oral Daily    PRN meds: docusate sodium, ipratropium, ondansetron **OR** ondansetron (ZOFRAN) IV, mouth rinse, traMADol   Antimicrobials: Anti-infectives (From admission, onward)    Start     Dose/Rate Route Frequency Ordered Stop   09/05/22 0000  ciprofloxacin (CIPRO) 500 MG tablet  Status:  Discontinued        500 mg Oral 2 times daily 09/05/22 0906 09/06/22    09/04/22 1200  ciprofloxacin (CIPRO) tablet 500 mg  Status:  Discontinued        500 mg Oral 2 times daily 09/04/22 1111 09/06/22 0756   08/30/22 2215  ciprofloxacin (CIPRO) IVPB 400 mg  Status:  Discontinued        400 mg 200 mL/hr over 60 Minutes Intravenous Every 12 hours 08/30/22 2214 09/01/22 1138   08/30/22 2215  metroNIDAZOLE (FLAGYL) IVPB 500 mg  Status:  Discontinued        500 mg 100 mL/hr over 60 Minutes Intravenous Every 12 hours 08/30/22 2214 09/01/22 1138   08/30/22 1530  ceFEPIme (MAXIPIME) 2 g in sodium chloride 0.9 % 100 mL IVPB        2 g 200 mL/hr over 30 Minutes Intravenous  Once 08/30/22 1515 08/30/22 1647   08/30/22 1530  metroNIDAZOLE (FLAGYL) IVPB 500 mg        500 mg 100 mL/hr over 60 Minutes Intravenous  Once 08/30/22 1515 08/30/22 1812       Objective: Vitals:   09/10/22 0514 09/10/22 0802  BP: 111/64 109/62  Pulse: (!) 59 62  Resp: 16   Temp: (!) 97.5 F (36.4 C)   SpO2: 99% 97%    Intake/Output Summary (Last 24 hours) at 09/10/2022 1332 Last data filed at 09/09/2022 1955 Gross per 24 hour  Intake 0 ml  Output 1050 ml  Net -1050 ml   There were no vitals filed for this visit. Weight change:  There is  no height or weight on file to calculate BMI.   Physical Exam: General exam: Pleasant, elderly Caucasian female.  Not in physical distress.  Feels weak Skin: No rashes, lesions or ulcers. HEENT: Atraumatic, normocephalic, no obvious bleeding Lungs: Clear to auscultation bilaterally CVS: Regular rate and rhythm, no murmur GI/Abd soft,  nontender, nondistended, bowel sound present CNS: Alert, awake, oriented x3.  Slow to respond. Psychiatry: Mood appropriate Extremities: No pedal edema, no calf tenderness  Data Review: I have personally reviewed the laboratory data and studies available.  F/u labs ordered Unresulted Labs (From admission, onward)     Start     Ordered   09/11/22 0500  CBC with Differential/Platelet  Tomorrow morning,   R       Question:  Specimen collection method  Answer:  Lab=Lab collect   09/10/22 1332   09/09/22 1136  ANA w/Reflex  Once,   R       Question:  Specimen collection method  Answer:  Lab=Lab collect   09/09/22 1136   09/08/22 1137  HSV DNA by PCR (reference lab) Blood  Once,   R        09/08/22 1136   09/08/22 0500  Comprehensive metabolic panel  Daily at 5am,   R     Question:  Specimen collection method  Answer:  Lab=Lab collect   09/07/22 1838   09/07/22 0500  Comprehensive metabolic panel  Daily at 5am,   R     Question:  Specimen collection method  Answer:  Lab=Lab collect   09/06/22 1656            Signed, Terrilee Croak, MD [facility] 09/10/2022

## 2022-09-11 DIAGNOSIS — N322 Vesical fistula, not elsewhere classified: Secondary | ICD-10-CM | POA: Diagnosis not present

## 2022-09-11 LAB — ANA W/REFLEX: Anti Nuclear Antibody (ANA): NEGATIVE

## 2022-09-11 LAB — COMPREHENSIVE METABOLIC PANEL
ALT: 752 U/L — ABNORMAL HIGH (ref 0–44)
AST: 751 U/L — ABNORMAL HIGH (ref 15–41)
Albumin: 2.3 g/dL — ABNORMAL LOW (ref 3.5–5.0)
Alkaline Phosphatase: 251 U/L — ABNORMAL HIGH (ref 38–126)
Anion gap: 6 (ref 5–15)
BUN: 39 mg/dL — ABNORMAL HIGH (ref 8–23)
CO2: 23 mmol/L (ref 22–32)
Calcium: 8.1 mg/dL — ABNORMAL LOW (ref 8.9–10.3)
Chloride: 103 mmol/L (ref 98–111)
Creatinine, Ser: 0.56 mg/dL (ref 0.44–1.00)
GFR, Estimated: >60 mL/min (ref >60)
Glucose, Bld: 140 mg/dL — ABNORMAL HIGH (ref 70–99)
Potassium: 3.7 mmol/L (ref 3.5–5.1)
Sodium: 132 mmol/L — ABNORMAL LOW (ref 135–145)
Total Bilirubin: 1.2 mg/dL (ref 0.3–1.2)
Total Protein: 6 g/dL — ABNORMAL LOW (ref 6.5–8.1)

## 2022-09-11 LAB — GLUCOSE, CAPILLARY
Glucose-Capillary: 153 mg/dL — ABNORMAL HIGH (ref 70–99)
Glucose-Capillary: 246 mg/dL — ABNORMAL HIGH (ref 70–99)
Glucose-Capillary: 218 mg/dL — ABNORMAL HIGH (ref 70–99)
Glucose-Capillary: 267 mg/dL — ABNORMAL HIGH (ref 70–99)

## 2022-09-11 LAB — CBC WITH DIFFERENTIAL/PLATELET
Abs Immature Granulocytes: 0.04 10*3/uL (ref 0.00–0.07)
Basophils Absolute: 0 10*3/uL (ref 0.0–0.1)
Basophils Relative: 0 %
Eosinophils Absolute: 0 10*3/uL (ref 0.0–0.5)
Eosinophils Relative: 1 %
HCT: 35.1 % — ABNORMAL LOW (ref 36.0–46.0)
Hemoglobin: 11.2 g/dL — ABNORMAL LOW (ref 12.0–15.0)
Immature Granulocytes: 1 %
Lymphocytes Relative: 17 %
Lymphs Abs: 1 10*3/uL (ref 0.7–4.0)
MCH: 31.6 pg (ref 26.0–34.0)
MCHC: 31.9 g/dL (ref 30.0–36.0)
MCV: 99.2 fL (ref 80.0–100.0)
Monocytes Absolute: 0.4 10*3/uL (ref 0.1–1.0)
Monocytes Relative: 6 %
Neutro Abs: 4.7 10*3/uL (ref 1.7–7.7)
Neutrophils Relative %: 75 %
Platelets: 172 10*3/uL (ref 150–400)
RBC: 3.54 MIL/uL — ABNORMAL LOW (ref 3.87–5.11)
RDW: 21.2 % — ABNORMAL HIGH (ref 11.5–15.5)
Smear Review: NORMAL
WBC: 6.2 10*3/uL (ref 4.0–10.5)
nRBC: 0 % (ref 0.0–0.2)

## 2022-09-11 NOTE — Plan of Care (Signed)
  Problem: Education: Goal: Ability to describe self-care measures that may prevent or decrease complications (Diabetes Survival Skills Education) will improve Outcome: Progressing Goal: Individualized Educational Video(s) Outcome: Progressing   Problem: Coping: Goal: Ability to adjust to condition or change in health will improve Outcome: Progressing   Problem: Fluid Volume: Goal: Ability to maintain a balanced intake and output will improve Outcome: Progressing   Problem: Health Behavior/Discharge Planning: Goal: Ability to identify and utilize available resources and services will improve Outcome: Progressing Goal: Ability to manage health-related needs will improve Outcome: Progressing   Problem: Metabolic: Goal: Ability to maintain appropriate glucose levels will improve Outcome: Progressing   Problem: Nutritional: Goal: Maintenance of adequate nutrition will improve Outcome: Progressing Goal: Progress toward achieving an optimal weight will improve Outcome: Progressing   Problem: Skin Integrity: Goal: Risk for impaired skin integrity will decrease Outcome: Progressing   Problem: Tissue Perfusion: Goal: Adequacy of tissue perfusion will improve Outcome: Progressing   Problem: Education: Goal: Knowledge of General Education information will improve Description: Including pain rating scale, medication(s)/side effects and non-pharmacologic comfort measures Outcome: Progressing   Problem: Health Behavior/Discharge Planning: Goal: Ability to manage health-related needs will improve Outcome: Progressing   Problem: Clinical Measurements: Goal: Ability to maintain clinical measurements within normal limits will improve Outcome: Progressing Goal: Will remain free from infection Outcome: Progressing Goal: Diagnostic test results will improve Outcome: Progressing Goal: Respiratory complications will improve Outcome: Progressing Goal: Cardiovascular complication will  be avoided Outcome: Progressing   Problem: Activity: Goal: Risk for activity intolerance will decrease Outcome: Progressing   Problem: Coping: Goal: Level of anxiety will decrease Outcome: Progressing   Problem: Pain Managment: Goal: General experience of comfort will improve Outcome: Progressing

## 2022-09-11 NOTE — Progress Notes (Signed)
[facility] Cardiology    SUBJECTIVE: Patient resting comfortably and plan denies any pain still has generalized weakness some shortness of breath   Vitals:   09/10/22 1606 09/10/22 2048 09/11/22 0434 09/11/22 0830  BP: (!) 97/57 94/69 102/61 112/67  Pulse: 68 66 69 78  Resp: '19 16 16   '$ Temp: 97.6 F (36.4 C) (!) 97.4 F (36.3 C) (!) 97.3 F (36.3 C) (!) 97.4 F (36.3 C)  TempSrc: Oral Axillary  Axillary  SpO2: 98% 94% 97% 95%     Intake/Output Summary (Last 24 hours) at 09/11/2022 1033 Last data filed at 09/11/2022 0434 Gross per 24 hour  Intake --  Output 895 ml  Net -895 ml      PHYSICAL EXAM  General: Well developed, well nourished, in no acute distress HEENT:  Normocephalic and atramatic Neck:  No JVD.  Lungs: Clear bilaterally to auscultation and percussion. Heart: HRRR . Normal S1 and S2 without gallops or murmurs.  Abdomen: Bowel sounds are positive, abdomen soft and non-tender  Msk:  Back normal, normal gait. Normal strength and tone for age. Extremities: No clubbing, cyanosis or edema.   Neuro: Alert and oriented X 3. Psych:  Good affect, responds appropriately   LABS: Basic Metabolic Panel: Recent Labs    09/10/22 0642 09/11/22 0449  NA 135 132*  K 3.5 3.7  CL 101 103  CO2 27 23  GLUCOSE 91 140*  BUN 48* 39*  CREATININE 0.69 0.56  CALCIUM 8.5* 8.1*   Liver Function Tests: Recent Labs    09/10/22 0642 09/11/22 0449  AST 1,512* 751*  ALT 991* 752*  ALKPHOS 280* 251*  BILITOT 1.6* 1.2  PROT 5.7* 6.0*  ALBUMIN 2.3* 2.3*   No results for input(s): "LIPASE", "AMYLASE" in the last 72 hours. CBC: Recent Labs    09/11/22 0449  WBC 6.2  NEUTROABS 4.7  HGB 11.2*  HCT 35.1*  MCV 99.2  PLT 172   Cardiac Enzymes: No results for input(s): "CKTOTAL", "CKMB", "CKMBINDEX", "TROPONINI" in the last 72 hours. BNP: Invalid input(s): "POCBNP" D-Dimer: No results for input(s): "DDIMER" in the last 72 hours. Hemoglobin A1C: No results for input(s):  "HGBA1C" in the last 72 hours. Fasting Lipid Panel: No results for input(s): "CHOL", "HDL", "LDLCALC", "TRIG", "CHOLHDL", "LDLDIRECT" in the last 72 hours. Thyroid Function Tests: No results for input(s): "TSH", "T4TOTAL", "T3FREE", "THYROIDAB" in the last 72 hours.  Invalid input(s): "FREET3" Anemia Panel: No results for input(s): "VITAMINB12", "FOLATE", "FERRITIN", "TIBC", "IRON", "RETICCTPCT" in the last 72 hours.  No results found.   Echo moderate to severely depressed left ventricular function EF around 25 to 30%  TELEMETRY: Atrial fibrillation rate of 70 nonspecific ST-T changes:  ASSESSMENT AND PLAN:  Principal Problem:   Bladder fistula Active Problems:   Protein-calorie malnutrition, severe   Atrial fibrillation with RVR (HCC)   Uncontrolled type 2 diabetes mellitus with hyperglycemia, without long-term current use of insulin (HCC)   Hypothyroidism   Elevated LFTs   Acute urinary retention   Acute hepatitis    Plan Atrial fibrillation paroxysmal continue rate control poor anticoagulation candidate Ischemic cardiomyopathy status post non-STEMI EF around 25% no significant failure symptoms continue current management Hypertension now basically mainly hypotensive continue conservative management Diabetes type 2 uncomplicated continue current therapy Colon cancer work-up and therapy as per oncology Status post bladder surgery postoperative complications reasonably stable Systolic congestive heart failure reasonably compensated continue current management   Yolonda Kida, MD 09/11/2022 10:33 AM

## 2022-09-11 NOTE — Progress Notes (Signed)
PROGRESS NOTE  Sarah [name]  DOB: August 25, 1946  PCP: Rusty Aus, MD TSV:[id number]  DOA: 08/30/2022  LOS: 7 days  Hospital Day: 13  Brief narrative: Sarah [name] is a 76 y.o. female with PMH significant for DM2, HTN, HLD, chronic systolic CHF, thyroid cancer, hypothyroidism, recent history of diverticulitis with abscess formation  In July 2023, patient was diagnosed with diverticulitis and abscess.  Underwent IR guided drain placement discharged with symptoms did not improve. 07/19/2022, underwent scheduled laparoscopic sigmoidectomy and Hartman's procedure.  Pathology showed invasive colorectal adenocarcinoma, margins negative, nodes negative. Postop complications, patient developed rapid atrial fibrillation, hypotension and non-STEMI with heart failure and ejection fraction now of 25%.  Also found to have extensive CAD, but not felt to be candidate for CABG given comorbidities.   9/4, she was discharged to a SNF. 9/14, readmitted for Pseudomonas infection midline open wound.  Also found to have intra-abdominal abscess, drained potentially by IR.  During that hospitalization, patient developed urinary retention and was discharged on Foley catheter. 9/28, Foley catheter was removed at urologist office in the morning. However per documentation, she was not brought back in the afternoon from SNF for follow-up imaging.  It is not clear if patient was able to void after Foley catheter was removed.  10/3, patient was sent back from SNF for increased JP drainage.  CT scan showed bladder distention.  After Foley catheter was placed, there was marked decrease in output from her JP drain.  The fluid sample obtained from around JP fluid noted a creatinine of 21.6 indicated of urine. 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 Patient was admitted to [facility] service  Over the course of the next several days, patient showed signs of improvement and plan was to  discharge her back to skilled nursing however labs done on morning of 10/10 noted significantly elevated transaminases with AST of 786 and ALT of 442.  Previously normal LFTs.  GI consulted with consideration for possibly drug-induced hepatitis.  Viral hepatitis panel unremarkable.  See below for details.  Subjective: Patient was seen and examined this morning..  Pleasant elderly Caucasian female.  Lying on bed.  Not in distress. Foley catheter with clear urine. Liver enzymes show slow improvement.  Assessment and plan: Elevated LFTs LFTs trend as below which shows an worsening pattern of AST and ALT as well as bilirubin and INR. Acute hepatitis panel nonreactive 10/11, ultrasound right upper quadrant showed increased echogenicity of the liver suggestive of hepatic steatosis. Suspicion at this time is hepatic congestion due to systolic CHF.  Amiodarone may also have a role and hence it was stopped after discussion with cardiology. Labs trend as below show gradually improving liver enzyme level.  Significant improvement in last 24 hours.  Repeat tomorrow.  If continues to improve, hopefully able to discharge in 1 to 2 days. Recent Labs  Lab 09/06/22 0438 09/07/22 0619 09/08/22 0458 09/09/22 0528 09/10/22 0642 09/11/22 0449  AST 786* 799* 1,298* 1,838* 1,512* 751*  ALT 442* 523* 767* 1,024* 991* 752*  ALKPHOS 212* 302* 316* 303* 280* 251*  BILITOT 0.9 1.1 1.6* 2.2* 1.6* 1.2  PROT 6.2* 6.4* 6.6 6.2* 5.7* 6.0*  ALBUMIN 2.5* 2.6* 2.7* 2.6* 2.3* 2.3*  INR  --   --   --  3.4*  --   --   PLT 208  --   --   --   --  172   Acute on chronic systolic CHF During the last  hospitalization 8/22-9/4, patient was noted to have EF of 25 to 30%. [place] 9/1 showed moderate to severe dilatation of LV chamber.  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 10/12, BNP significantly elevated to over 4500.   PTA on metoprolol 25 mg 3 times daily, Lasix  40 mg daily, lisinopril 2.5 mg daily, Aldactone 12.5 mg daily. Currently continued on metoprolol.  Lisinopril and Aldactone on hold. With the concern of congestive hepatopathy, patient was briefly diuresed with IV Lasix, about 3 L of negative balance since admission. Net IO Since Admission: -2,881.57 mL [09/11/22 1203] Continue to monitor for daily intake output, weight, blood pressure, BNP, renal function and electrolytes. Recent Labs  Lab 09/07/22 0619 09/08/22 0458 09/08/22 1951 09/09/22 0528 09/10/22 0642 09/11/22 0449  BNP  --   --  >4,500.0*  --   --   --   BUN 42* 41*  --  49* 48* 39*  CREATININE 0.82 0.83  --  0.78 0.69 0.56  K 5.1 4.7  --  4.6 3.5 3.7  Bladder fistula Acute urinary retention Per urology recommendation, to maintain Foley catheter to allow decompression and healing of the fistula.   Uncontrolled type 2 diabetes mellitus A1c 8.3 on 05/30/2022 PTA on glimepiride 4 mg daily.  Currently on hold. Currently on sliding scale insulin with Accu-Cheks Recent Labs  Lab 09/10/22 0803 09/10/22 1144 09/10/22 1609 09/10/22 2049 09/11/22 0833  GLUCAP 81 143* 220* 176* 153*  Extensive CAD Not amenable for CABG per [facility] 9/1 Continue Eliquis, aspirin.  Statin on hold  Atrial fibrillation with RVR Rate controlled now on low pressure. Amiodarone was stopped by cardiology as it could be the contributing factor to her transaminitis.  Continue Eliquis for anticoagulation   Protein-calorie malnutrition, severe Nutrition evaluation  Chronic anemia Last hemoglobin was 9.9 on 10/10.  Continue to monitor.  No active bleeding. Recent Labs    07/23/22 0244 07/23/22 0820 07/24/22 0109 08/31/22 0530 09/01/22 0516 09/02/22 0500 09/06/22 0438 09/11/22 0449  HGB 7.7*  --    < > 9.6* 8.7* 9.1* 9.9* 11.2*  MCV 86.6  --    < > 97.8 96.9 97.7 98.5 99.2  VITAMINB12  --  1,446*  --   --   --   --   --   --   FERRITIN 191  --   --   --   --   --   --   --   TIBC 134*  --   --    --   --   --   --   --   IRON 33  --   --   --   --   --   --   --    < > = values in this interval not displayed.   Hypothyroidism Continue Synthroid   Goals of care   Code Status: DNR    Mobility: Encourage to participate with PT  Skin assessment:  Pressure Injury 07/21/22 Buttocks Right Unstageable - Full thickness tissue loss in which the base of the injury is covered by slough (yellow, tan, gray, green or brown) and/or eschar (tan, brown or black) in the wound bed. (Active)  07/21/22 1400  Location: Buttocks  Location Orientation: Right  Staging: Unstageable - Full thickness tissue loss in which the base of the injury is covered by slough (yellow, tan, gray, green or brown) and/or eschar (tan, brown or black) in the wound bed.  Wound  Description (Comments):   Present on Admission: Yes (patient states she thinks she has had this wound from sitting in her recliner at home.)    Nutritional status:  There is no height or weight on file to calculate BMI.          Diet:  Diet Order             Diet Carb Modified Fluid consistency: Thin; Room service appropriate? Yes  Diet effective now                   DVT prophylaxis:  apixaban (ELIQUIS) tablet 2.5 mg Start: 08/30/22 2215 SCDs Start: 08/30/22 2212 apixaban (ELIQUIS) tablet 2.5 mg   Antimicrobials: None Fluid: None Consultants: GI Family Communication: None at bedside  Status is: Inpatient  Continue in-hospital care because: Improving LFTs. Level of care: Med-Surg   Dispo: The patient is from: SNF              Anticipated d/c is to: Back to SNF hopefully tomorrow              Patient currently is not medically stable to d/c.   Difficult to place patient No     Infusions:    Scheduled Meds:  apixaban  2.5 mg Oral BID   aspirin  81 mg Oral Daily   Chlorhexidine Gluconate Cloth  6 each Topical Daily   feeding supplement  237 mL Oral TID BM   insulin aspart  0-9 Units Subcutaneous TID WC    leptospermum manuka honey  1 Application Topical Daily   levothyroxine  137 mcg Oral Q0600   metoprolol tartrate  25 mg Oral TID   sertraline  100 mg Oral Daily   venlafaxine  37.5 mg Oral Daily    PRN meds: docusate sodium, ipratropium, ondansetron **OR** ondansetron (ZOFRAN) IV, mouth rinse, traMADol   Antimicrobials: Anti-infectives (From admission, onward)    Start     Dose/Rate Route Frequency Ordered Stop   09/05/22 0000  ciprofloxacin (CIPRO) 500 MG tablet  Status:  Discontinued        500 mg Oral 2 times daily 09/05/22 0906 09/06/22    09/04/22 1200  ciprofloxacin (CIPRO) tablet 500 mg  Status:  Discontinued        500 mg Oral 2 times daily 09/04/22 1111 09/06/22 0756   08/30/22 2215  ciprofloxacin (CIPRO) IVPB 400 mg  Status:  Discontinued        400 mg 200 mL/hr over 60 Minutes Intravenous Every 12 hours 08/30/22 2214 09/01/22 1138   08/30/22 2215  metroNIDAZOLE (FLAGYL) IVPB 500 mg  Status:  Discontinued        500 mg 100 mL/hr over 60 Minutes Intravenous Every 12 hours 08/30/22 2214 09/01/22 1138   08/30/22 1530  ceFEPIme (MAXIPIME) 2 g in sodium chloride 0.9 % 100 mL IVPB        2 g 200 mL/hr over 30 Minutes Intravenous  Once 08/30/22 1515 08/30/22 1647   08/30/22 1530  metroNIDAZOLE (FLAGYL) IVPB 500 mg        500 mg 100 mL/hr over 60 Minutes Intravenous  Once 08/30/22 1515 08/30/22 1812       Objective: Vitals:   09/11/22 0434 09/11/22 0830  BP: 102/61 112/67  Pulse: 69 78  Resp: 16   Temp: (!) 97.3 F (36.3 C) (!) 97.4 F (36.3 C)  SpO2: 97% 95%    Intake/Output Summary (Last 24 hours) at 09/11/2022 1203 Last data filed at 09/11/2022  7544 Gross per 24 hour  Intake --  Output 895 ml  Net -895 ml   There were no vitals filed for this visit. Weight change:  There is no height or weight on file to calculate BMI.   Physical Exam: General exam: Pleasant, elderly Caucasian female.  Not in physical distress.  Feels weak Skin: No rashes, lesions or  ulcers. HEENT: Atraumatic, normocephalic, no obvious bleeding Lungs: Clear to auscultation bilaterally CVS: Regular rate and rhythm, no murmur GI/Abd soft, nontender, nondistended, bowel sound present CNS: Alert, awake, oriented x3.   Psychiatry: Mood appropriate Extremities: No pedal edema, no calf tenderness  Data Review: I have personally reviewed the laboratory data and studies available.  F/u labs ordered Unresulted Labs (From admission, onward)     Start     Ordered   09/12/22 0500  CBC with Differential/Platelet  Tomorrow morning,   R       Question:  Specimen collection method  Answer:  Lab=Lab collect   09/11/22 1201   09/09/22 1136  ANA w/Reflex  Once,   R       Question:  Specimen collection method  Answer:  Lab=Lab collect   09/09/22 1136   09/08/22 1137  HSV DNA by PCR (reference lab) Blood  Once,   R        09/08/22 1136   09/08/22 0500  Comprehensive metabolic panel  Daily at 5am,   R     Question:  Specimen collection method  Answer:  Lab=Lab collect   09/07/22 1838            Signed, Terrilee Croak, MD [facility] 09/11/2022

## 2022-09-12 ENCOUNTER — Inpatient Hospital Stay: Payer: Medicare Other

## 2022-09-12 ENCOUNTER — Encounter: Payer: PRIVATE HEALTH INSURANCE | Primary: Family Medicine

## 2022-09-12 LAB — CBC WITH DIFFERENTIAL/PLATELET
Abs Immature Granulocytes: 0.04 10*3/uL (ref 0.00–0.07)
Basophils Absolute: 0 10*3/uL (ref 0.0–0.1)
Basophils Relative: 0 %
Eosinophils Absolute: 0 10*3/uL (ref 0.0–0.5)
Eosinophils Relative: 0 %
HCT: 37.4 % (ref 36.0–46.0)
Hemoglobin: 11.4 g/dL — ABNORMAL LOW (ref 12.0–15.0)
Immature Granulocytes: 1 %
Lymphocytes Relative: 14 %
Lymphs Abs: 1.2 10*3/uL (ref 0.7–4.0)
MCH: 31.1 pg (ref 26.0–34.0)
MCHC: 30.5 g/dL (ref 30.0–36.0)
MCV: 102.2 fL — ABNORMAL HIGH (ref 80.0–100.0)
Monocytes Absolute: 0.4 10*3/uL (ref 0.1–1.0)
Monocytes Relative: 5 %
Neutro Abs: 7.1 10*3/uL (ref 1.7–7.7)
Neutrophils Relative %: 80 %
Platelets: 165 10*3/uL (ref 150–400)
RBC: 3.66 MIL/uL — ABNORMAL LOW (ref 3.87–5.11)
RDW: 21.2 % — ABNORMAL HIGH (ref 11.5–15.5)
Smear Review: NORMAL
WBC: 8.7 10*3/uL (ref 4.0–10.5)
nRBC: 0 % (ref 0.0–0.2)

## 2022-09-12 LAB — COMPREHENSIVE METABOLIC PANEL
ALT: 579 U/L — ABNORMAL HIGH (ref 0–44)
AST: 358 U/L — ABNORMAL HIGH (ref 15–41)
Albumin: 2.5 g/dL — ABNORMAL LOW (ref 3.5–5.0)
Alkaline Phosphatase: 253 U/L — ABNORMAL HIGH (ref 38–126)
Anion gap: 8 (ref 5–15)
BUN: 41 mg/dL — ABNORMAL HIGH (ref 8–23)
CO2: 26 mmol/L (ref 22–32)
Calcium: 8.7 mg/dL — ABNORMAL LOW (ref 8.9–10.3)
Chloride: 100 mmol/L (ref 98–111)
Creatinine, Ser: 0.57 mg/dL (ref 0.44–1.00)
GFR, Estimated: >60 mL/min (ref >60)
Glucose, Bld: 309 mg/dL — ABNORMAL HIGH (ref 70–99)
Potassium: 3.8 mmol/L (ref 3.5–5.1)
Sodium: 134 mmol/L — ABNORMAL LOW (ref 135–145)
Total Bilirubin: 0.9 mg/dL (ref 0.3–1.2)
Total Protein: 6.2 g/dL — ABNORMAL LOW (ref 6.5–8.1)

## 2022-09-12 LAB — BLOOD GAS, ARTERIAL
Acid-base deficit: 1.3 mmol/L (ref 0.0–2.0)
Bicarbonate: 22.7 mmol/L (ref 20.0–28.0)
Drawn by: 51910
O2 Content: 5 L/min
O2 Saturation: 93.2 %
Patient temperature: 37
pCO2 arterial: 35 mmHg (ref 32–48)
pH, Arterial: 7.42 (ref 7.35–7.45)
pO2, Arterial: 65 mmHg — ABNORMAL LOW (ref 83–108)

## 2022-09-12 LAB — HSV DNA BY PCR (REFERENCE LAB)
HSV 1 DNA: NEGATIVE
HSV 2 DNA: NEGATIVE

## 2022-09-12 LAB — GLUCOSE, CAPILLARY
Glucose-Capillary: 287 mg/dL — ABNORMAL HIGH (ref 70–99)
Glucose-Capillary: 251 mg/dL — ABNORMAL HIGH (ref 70–99)
Glucose-Capillary: 188 mg/dL — ABNORMAL HIGH (ref 70–99)
Glucose-Capillary: 152 mg/dL — ABNORMAL HIGH (ref 70–99)

## 2022-09-12 MED ORDER — GLIMEPIRIDE 4 MG PO TABS
4.0000 mg | ORAL_TABLET | Freq: Every day | ORAL | Status: DC
  Administered 2022-09-13: 4 mg via ORAL
  Filled 2022-09-12: qty 1

## 2022-09-12 MED ORDER — FUROSEMIDE 10 MG/ML IJ SOLN
20.0000 mg | Freq: Two times a day (BID) | INTRAMUSCULAR | Status: DC
  Administered 2022-09-13: 20 mg via INTRAVENOUS
  Filled 2022-09-12: qty 4

## 2022-09-12 MED ORDER — FUROSEMIDE 10 MG/ML IJ SOLN
20.0000 mg | Freq: Once | INTRAMUSCULAR | Status: AC
  Administered 2022-09-12: 20 mg via INTRAVENOUS
  Filled 2022-09-12: qty 4

## 2022-09-12 MED ORDER — MONTELUKAST SODIUM 10 MG PO TABS
10 MG | ORAL_TABLET | ORAL | 1 refills | Status: AC
Start: 2022-09-12 — End: 2023-03-13

## 2022-09-12 NOTE — Progress Notes (Signed)
Occupational Therapy Treatment Patient Details Name: Sarah [name] MRN: [id number] DOB: 06/21/1946 Today's Date: 09/12/2022   History of present illness Pt is a 76 y.o. female with past medical history significant for CAD, A-fib, prolonged hospitalization with complicated perforated sigmoid diverticulitis with abdominal pelvic abscess drainage s/p 2 abdominal pelvic drain placements with IR and ex lap/Hartman's procedurewho presents to the emergency department with generalized weakness and increased output from her JP drain.  She has been at a facility who noted increased output from her JP drain.  Worsening generalized weakness and fatigue. MD assessment includes: Bladder perforation/fistula.   OT comments  Patient side lying in bed upon arrival, refusing mobility tasks, stating "I'm not getting up, my stomach hurts, maybe tomorrow." Max verbal cues for participation. Still self- limiting. Patient agreeable to perform grooming tasks in supine, with set-up. Rolling in bed with supervision, Patient assisted in repositioning self in bed. Patient again educated on importance of therapy, patient still declining. Nurse informed. Patient left in bed with bed alarm set, call bell in reach, and all needs met.    Recommendations for follow up therapy are one component of a multi-disciplinary discharge planning process, led by the attending physician.  Recommendations may be updated based on patient status, additional functional criteria and insurance authorization.    Follow Up Recommendations  Skilled nursing-short term rehab (<3 hours/day)    Assistance Recommended at Discharge Frequent or constant Supervision/Assistance  Patient can return home with the following  A lot of help with walking and/or transfers;A lot of help with bathing/dressing/bathroom;Help with stairs or ramp for entrance;Assistance with cooking/housework   Equipment Recommendations  Other (comment) (Defer to next venue of care.)     Recommendations for Other Services      Precautions / Restrictions Precautions Precautions: Fall Precaution Comments: colostomy, 2 JP drains Restrictions Weight Bearing Restrictions: No       Mobility Bed Mobility   Bed Mobility: Rolling Rolling: Supervision              Transfers                   General transfer comment: Patient refused     Balance       Sitting balance - Comments: refused       Standing balance comment: refused                           ADL either performed or assessed with clinical judgement   ADL       Grooming: Wash/dry face;Set up                                      Extremity/Trunk Assessment Upper Extremity Assessment Upper Extremity Assessment: Generalized weakness   Lower Extremity Assessment Lower Extremity Assessment: Generalized weakness        Vision Baseline Vision/History: 1 Wears glasses Patient Visual Report: No change from baseline            Cognition Arousal/Alertness: Awake/alert Behavior During Therapy: Flat affect Overall Cognitive Status: Within Functional Limits for tasks assessed  Pertinent Vitals/ Pain       Pain Assessment Pain Assessment: Faces Faces Pain Scale: Hurts little more Pain Location: stomach Pain Descriptors / Indicators: Discomfort, Guarding Pain Intervention(s): Limited activity within patient's tolerance, Repositioned   Frequency  Min 2X/week        Progress Toward Goals  OT Goals(current goals can now be found in the care plan section)  Progress towards OT goals: OT to reassess next treatment  Acute Rehab OT Goals Patient Stated Goal: none stated OT Goal Formulation: Patient unable to participate in goal setting Time For Goal Achievement: 09/15/22 Potential to Achieve Goals: [place] Discharge plan remains appropriate;Frequency remains appropriate        AM-PAC OT "6 Clicks" Daily Activity     Outcome Measure   Help from another person eating meals?: None Help from another person taking care of personal grooming?: A Little Help from another person toileting, which includes using toliet, bedpan, or urinal?: A Lot Help from another person bathing (including washing, rinsing, drying)?: A Lot Help from another person to put on and taking off regular upper body clothing?: A Little Help from another person to put on and taking off regular lower body clothing?: A Lot 6 Click Score: 16    End of Session    OT Visit Diagnosis: Other abnormalities of gait and mobility (R26.89);Muscle weakness (generalized) (M62.81)   Activity Tolerance Patient limited by pain;Patient limited by fatigue;Patient limited by lethargy   Patient Left in bed;with call bell/phone within reach;with bed alarm set   Nurse Communication Mobility status        Time: 1451-1501 OT Time Calculation (min): 10 min  Charges:      Tomasa Blase, OTS 09/12/2022, 3:16 PM

## 2022-09-12 NOTE — Progress Notes (Signed)
[facility] Card[facility]    SUBJECTIVE: Patient states to be doing reasonably well denies any significant complaints no palpitation tachycardia chest pain shortness of breath no abdominal pain resting comfortably still has a poor appetite   Vitals:   09/11/22 1709 09/11/22 2049 09/12/22 0424 09/12/22 0847  BP: 120/72 116/68 126/71 115/69  Pulse: 97 83 78 73  Resp:  '20 20 16  '$ Temp: (!) 97.4 F (36.3 C) 97.9 F (36.6 C) 97.7 F (36.5 C) 97.9 F (36.6 C)  TempSrc: Oral Oral Oral Oral  SpO2: 94% 91% 92% 92%     Intake/Output Summary (Last 24 hours) at 09/12/2022 1120 Last data filed at 09/12/2022 1025 Gross per 24 hour  Intake 0 ml  Output 15 ml  Net -15 ml      PHYSICAL EXAM  General: Well developed, well nourished, in no acute distress HEENT:  Normocephalic and atramatic Neck:  No JVD.  Lungs: Clear bilaterally to auscultation and percussion. Heart: HRRR . Normal S1 and S2 without gallops or murmurs.  Abdomen: Bowel sounds are positive, abdomen soft and non-tender  Msk:  Back normal, normal gait. Normal strength and tone for age. Extremities: No clubbing, cyanosis or edema.   Neuro: Alert and oriented X 3. Psych:  Good affect, responds appropriately   LABS: Basic Metabolic Panel: Recent Labs    09/11/22 0449 09/12/22 0509  NA 132* 134*  K 3.7 3.8  CL 103 100  CO2 23 26  GLUCOSE 140* 309*  BUN 39* 41*  CREATININE 0.56 0.57  CALCIUM 8.1* 8.7*   Liver Function Tests: Recent Labs    09/11/22 0449 09/12/22 0509  AST 751* 358*  ALT 752* 579*  ALKPHOS 251* 253*  BILITOT 1.2 0.9  PROT 6.0* 6.2*  ALBUMIN 2.3* 2.5*   No results for input(s): "LIPASE", "AMYLASE" in the last 72 hours. CBC: Recent Labs    09/11/22 0449 09/12/22 0509  WBC 6.2 8.7  NEUTROABS 4.7 7.1  HGB 11.2* 11.4*  HCT 35.1* 37.4  MCV 99.2 102.2*  PLT 172 165   Cardiac Enzymes: No results for input(s): "CKTOTAL", "CKMB", "CKMBINDEX", "TROPONINI" in the last 72 hours. BNP: Invalid  input(s): "POCBNP" D-Dimer: No results for input(s): "DDIMER" in the last 72 hours. Hemoglobin A1C: No results for input(s): "HGBA1C" in the last 72 hours. Fasting Lipid Panel: No results for input(s): "CHOL", "HDL", "LDLCALC", "TRIG", "CHOLHDL", "LDLDIRECT" in the last 72 hours. Thyroid Function Tests: No results for input(s): "TSH", "T4TOTAL", "T3FREE", "THYROIDAB" in the last 72 hours.  Invalid input(s): "FREET3" Anemia Panel: No results for input(s): "VITAMINB12", "FOLATE", "FERRITIN", "TIBC", "IRON", "RETICCTPCT" in the last 72 hours.  No results found.   Echo moderate to severely reduced left ventricular function EF around 30%  TELEMETRY: Normal sinus rhythm rate control rate of around 75 nonspecific ST-T wave changes:  ASSESSMENT AND PLAN:  Principal Problem:   Bladder fistula Active Problems:   Protein-calorie malnutrition, severe   Atrial fibrillation with RVR (HCC)   Uncontrolled type 2 diabetes mellitus with hyperglycemia, without long-term current use of insulin (HCC)   Hypothyroidism   Elevated LFTs   Acute urinary retention   Acute hepatitis    Plan Elevated LFTs gradually improving continue adequate hydration Atrial fibrillation paroxysmal poor anticoagulation candidate continue rate control Ischemic cardiomyopathy status post non-STEMI asymptomatic continue conservative management Congestive heart failure systolic dysfunction EF of around 30% reasonably compensated continue conservative management Agree with diabetes management and control Postop.  From bladder surgery secondary to fistula  Aggressive nutrition  support Agree with oncology for colon cancer management and care   Yolonda Kida, MD 09/12/2022 11:20 AM

## 2022-09-12 NOTE — Progress Notes (Addendum)
PROGRESS NOTE Sarah [name]  UUV:[id number] DOB: 02/27/1946 DOA: 08/30/2022 PCP: Rusty Aus, MD   Brief Narrative/Hospital Course: 5-yof w/ DM2, HTN, HLD, chronic systolic CHF, thyroid cancer, hypothyroidism, recent history of diverticulitis with abscess formation  In July 2023, patient was diagnosed with diverticulitis and abscess.  Underwent IR guided drain placement discharged with symptoms did not improve. 07/19/2022, underwent scheduled laparoscopic sigmoidectomy and Hartman's procedure.  Pathology showed invasive colorectal adenocarcinoma, margins negative, nodes negative. Postop complications, patient developed rapid atrial fibrillation, hypotension and non-STEMI with heart failure and ejection fraction now of 25%.  Also found to have extensive CAD, but not felt to be candidate for CABG given comorbidities.   9/4, she was discharged to a SNF. 9/14, readmitted for Pseudomonas infection midline open wound.  Also found to have intra-abdominal abscess, drained potentially by IR.  During that hospitalization, patient developed urinary retention and was discharged on Foley catheter. 9/28, Foley catheter was removed at urologist office in the morning. However per documentation, she was not brought back in the afternoon from SNF for follow-up imaging.  It is not clear if patient was able to void after Foley catheter was removed.   10/3, patient was sent back from SNF for increased JP drainage.  CT scan showed bladder distention.  After Foley catheter was placed, there was marked decrease in output from her JP drain.  The fluid sample obtained from around JP fluid noted a creatinine of 21.6 indicated of urine. 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  Patient was admitted to [facility] service Over the course of the next several days, patient showed signs of improvement and plan was to discharge her back to skilled nursing however labs done on morning of 10/10  noted significantly elevated transaminases with AST of 786 and ALT of 442.  Previously normal LFTs.  GI consulted with consideration for possibly drug-induced hepatitis.  Viral hepatitis panel unremarkable.    Subjective: Seen and examined this morning appear weak frail. Foley in place along with LLQ JP drain and colostomy. Tolerating PO- but not much appetite.  Remains weak and deconditioned. AST/ALT: Peaked to 1838/1024 on 09/09/22> downtrending 350s/578, total bili nl.  Assessment and Plan: Principal Problem:   Bladder fistula Active Problems:   Elevated LFTs   Acute urinary retention   Protein-calorie malnutrition, severe   Atrial fibrillation with RVR (HCC)   Uncontrolled type 2 diabetes mellitus with hyperglycemia, without long-term current use of insulin (HCC)   Hypothyroidism   Acute hepatitis   Acute hepatitis with elevated LFTs: Predominantly hepatocellular pattern, previous cholecystectomy history, CT abdomen pelvis 10/3 no biliary obstruction or hepatic masses,LFTs on 10/3 normal, right upper quadrant ultrasound unremarkable, seen by GI unclear etiology,lack of multifactorial- patient has been amiodarone that has been stopped, Cipro has been discontinued, acute viral hepatitis panel negative, continue to avoid statin,hepatotoxic agents monitor LFTs, given diuresis to improve congestive hepatopathy now finally LFTs downtrending. Recent Labs  Lab 09/08/22 0458 09/09/22 0528 09/10/22 0642 09/11/22 0449 09/12/22 0509  AST 1,298* 1,838* 1,512* 751* 358*  ALT 767* 1,024* 991* 752* 579*  ALKPHOS 316* 303* 280* 251* 253*  BILITOT 1.6* 2.2* 1.6* 1.2 0.9  PROT 6.6 6.2* 5.7* 6.0* 6.2*  ALBUMIN 2.7* 2.6* 2.3* 2.3* 2.5*  INR  --  3.4*  --   --   --     Acute on chronic systolic CHF EF 47-42% on last hospitalization 8/22-9/4.  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  Patient was managed with IV Lasix.  Continue  Lopressor 25 mg 4 times daily.  At home on Lasix 40, lisinopril 2.5 Aldactone 12.5-and on hold.  Cardiology following await further input regarding diuresis. Monitor intake output Daily weight as below.Net I/O since Admission: -2,896.57 mL [09/12/22 1151]   Bladder fistula Acute urinary retention: Per urology cont Foley catheter to allow decompression and healing of the fistula. As per surgery-JP drain fluid  w/ high creatinine and JP drain output dropped significantly as soon as a Foley was placed for the new urinary retention.  Urology was consulted at this point and they recommended continuing with bladder decompression and follow-up cystogram as an outpatient since she is currently not a good candidate to proceed with any immediate surgical repair"  Type 2 diabetes mellitus with uncontrolled hyperglycemia A1c 8.3 on 7/3, on glimepiride 4 mg at home.  For now continue SSI and monitor as below Recent Labs  Lab 09/11/22 1214 09/11/22 1709 09/11/22 2114 09/12/22 0823 09/12/22 1125  GLUCAP 246* 218* 267* 287* 251*    Extensive CAD, not amenable for CABG per [facility] 9/1: Currently on Eliquis aspirin, statin on hold due to transaminitis  A-fib with RVR currently rate controlled on metoprolol, amiodarone discontinued due to liver dysfunction, continue Eliquis  Chronic anemia: Hemoglobin remained stable Recent Labs  Lab 09/06/22 0438 09/11/22 0449 09/12/22 0509  HGB 9.9* 11.2* 11.4*  HCT 31.9* 35.1* 37.4   Invasive colorectal adenocarcinoma, margins negative, nodes negative: Colostomy in place, she has a drain in place for past 30 days for her recent diverticulitis and abscess.  Continue the same.  Per Dr. Elroy Channel evaluation on 08/22/2022 : "She has T3 N0 disease based on pathology although T4 disease could not be ruled out any less than 12 lymph nodes were sampled. She does not require any adjuvant chemotherapy given her stage II disease poor baseline performance status and ongoing intra-abdominal  abscesses requiring drains.  From a colon cancer standpoint I will see her back in 3 months with labs"  Goals of care currently DNR,appears frail, ongoing colostomy and drain in place from her last diverticular abscess and surgery.  Patient was seen by palliative care previously.  Continue supportive care.  Nursing staff shares concern that patient has not not been eating well and not motivated to do anything deconditioned weak frail at risk of decompensation.  Palliative care has been reconsulted, overall prognosis does not appear bright.  Addendum 1722 pm Change in clinical situation with more o2 need needing Alfalfa at 5l tachypneic- she c/o shrotness of breath JVD+, no chest pain nauasea vomiting :I spoke with her medical poa Lars Pinks as per patient's wishes: I updated overall frail situation-she understands that she is very sick and her wishes are to be DNR no feeding tube and patient also confirmed it. I brought up idea that if continues tt get worse as she is with poor po intake, immobility- besides complex other medical issues- we may need to consider hospice-palliative care consulted and she agrees- we will try IV lasix x1 - cont [place] O2 and cont to monitor. She would like to be called immedicately if clinical status changes. Ordered cxr abg as well and reassess lasix need.   DVT prophylaxis: apixaban (ELIQUIS) tablet 2.5 mg Start: 08/30/22 2215 SCDs Start: 08/30/22 2212 Code Status:   Code Status: DNR Family Communication: plan of care discussed with patient at bedside.  Patient status [id number]  because of ongoing transaminatis Level of care: Med-Surg  Dispo:The patient is from: Currently private pay in a skilled nursing facility and has a bed.             Anticipated disposition: Back to skilled nursing facility likely tomorrow if labs remain stable Mobility Assessment (last 72 hours)     Mobility Assessment     Row Name 09/12/22 1134 09/09/22 2100         Does patient  have an order for bedrest or is patient medically unstable -- No - Continue assessment      What is the highest level of mobility based on the progressive mobility assessment? Level 1 (Bedfast) - Unable to balance while sitting on edge of bed Level 3 (Stands with assist) - Balance while standing  and cannot march in place      Is the above level different from baseline mobility prior to current illness? -- Yes - Recommend PT order                Objective: Vitals last 24 hrs: Vitals:   09/11/22 1709 09/11/22 2049 09/12/22 0424 09/12/22 0847  BP: 120/72 116/68 126/71 115/69  Pulse: 97 83 78 73  Resp:  '20 20 16  '$ Temp: (!) 97.4 F (36.3 C) 97.9 F (36.6 C) 97.7 F (36.5 C) 97.9 F (36.6 C)  TempSrc: Oral Oral Oral Oral  SpO2: 94% 91% 92% 92%   Weight change:   Physical Examination: General exam: alert awake, frail elderly appears debilitated HEENT:Oral mucosa moist, Ear/Nose WNL grossly Respiratory system: bilaterally clear BS, no use of accessory muscle Cardiovascular system: S1 & S2 +, No JVD. Gastrointestinal system: Abdomen soft,NT,ND, BS+ Foley in place along with LLQ JP drain in place for 30 days and colostomy. Nervous System:Alert, awake, moving extremities. Extremities: LE edema neg,distal peripheral pulses palpable.  Skin: No rashes,no icterus. MSK: thin muscle bulk,tone, power  Medications reviewed:  Scheduled Meds:  apixaban  2.5 mg Oral BID   aspirin  81 mg Oral Daily   Chlorhexidine Gluconate Cloth  6 each Topical Daily   feeding supplement  237 mL Oral TID BM   [START ON 09/13/2022] glimepiride  4 mg Oral Q breakfast   insulin aspart  0-9 Units Subcutaneous TID WC   leptospermum manuka honey  1 Application Topical Daily   levothyroxine  137 mcg Oral Q0600   metoprolol tartrate  25 mg Oral TID   sertraline  100 mg Oral Daily   venlafaxine  37.5 mg Oral Daily  Continuous Infusions: Diet Order             Diet Carb Modified Fluid consistency: Thin; Room  service appropriate? Yes  Diet effective now                  Intake/Output Summary (Last 24 hours) at 09/12/2022 1154 Last data filed at 09/12/2022 1025 Gross per 24 hour  Intake 0 ml  Output 15 ml  Net -15 ml  Data Reviewed: I have personally reviewed following labs and imaging studies CBC: Recent Labs  Lab 09/06/22 0438 09/11/22 0449 09/12/22 0509  WBC 6.7 6.2 8.7  NEUTROABS  --  4.7 7.1  HGB 9.9* 11.2* 11.4*  HCT 31.9* 35.1* 37.4  MCV 98.5 99.2 102.2*  PLT 208 172 329   Basic Metabolic Panel: Recent Labs  Lab 09/08/22 0458 09/09/22 0528 09/10/22 0642 09/11/22 0449 09/12/22 0509  NA 135 135 135 132* 134*  K  4.7 4.6 3.5 3.7 3.8  CL 101 100 101 103 100  CO2 '22 23 27 23 26  '$ GLUCOSE 109* 111* 91 140* 309*  BUN 41* 49* 48* 39* 41*  CREATININE 0.83 0.78 0.69 0.56 0.57  CALCIUM 9.2 9.2 8.5* 8.1* 8.7*   GFR: CrCl cannot be calculated (Unknown ideal weight.). Liver Function Tests: Recent Labs  Lab 09/08/22 0458 09/09/22 0528 09/10/22 0642 09/11/22 0449 09/12/22 0509  AST 1,298* 1,838* 1,512* 751* 358*  ALT 767* 1,024* 991* 752* 579*  ALKPHOS 316* 303* 280* 251* 253*  BILITOT 1.6* 2.2* 1.6* 1.2 0.9  PROT 6.6 6.2* 5.7* 6.0* 6.2*  ALBUMIN 2.7* 2.6* 2.3* 2.3* 2.5*  Coagulation Profile: Recent Labs  Lab 09/09/22 0528  INR 3.4*  CBG: Recent Labs  Lab 09/11/22 1214 09/11/22 1709 09/11/22 2114 09/12/22 0823 09/12/22 1125  GLUCAP 246* 218* 267* 287* 251*   Antimicrobials: Anti-infectives (From admission, onward)    Start     Dose/Rate Route Frequency Ordered Stop   09/05/22 0000  ciprofloxacin (CIPRO) 500 MG tablet  Status:  Discontinued        500 mg Oral 2 times daily 09/05/22 0906 09/06/22    09/04/22 1200  ciprofloxacin (CIPRO) tablet 500 mg  Status:  Discontinued        500 mg Oral 2 times daily 09/04/22 1111 09/06/22 0756   08/30/22 2215  ciprofloxacin (CIPRO) IVPB 400 mg  Status:  Discontinued        400 mg 200 mL/hr over 60 Minutes  Intravenous Every 12 hours 08/30/22 2214 09/01/22 1138   08/30/22 2215  metroNIDAZOLE (FLAGYL) IVPB 500 mg  Status:  Discontinued        500 mg 100 mL/hr over 60 Minutes Intravenous Every 12 hours 08/30/22 2214 09/01/22 1138   08/30/22 1530  ceFEPIme (MAXIPIME) 2 g in sodium chloride 0.9 % 100 mL IVPB        2 g 200 mL/hr over 30 Minutes Intravenous  Once 08/30/22 1515 08/30/22 1647   08/30/22 1530  metroNIDAZOLE (FLAGYL) IVPB 500 mg        500 mg 100 mL/hr over 60 Minutes Intravenous  Once 08/30/22 1515 08/30/22 1812      Culture/Microbiology    Component Value Date/Time   SDES BLOOD BLOOD LEFT ARM 08/30/2022 1551   SPECREQUEST  08/30/2022 1551    BOTTLES DRAWN AEROBIC AND ANAEROBIC Blood Culture adequate volume   CULT  08/30/2022 1551    NO GROWTH 5 DAYS Performed at [facility], [address] [place], [place] [address]    REPTSTATUS 09/04/2022 FINAL 08/30/2022 1551    Other culture-see note  Radiology Studies: No results found.   LOS: 13 days   Antonieta Pert, MD [facility]  09/12/2022, 11:54 AM

## 2022-09-12 NOTE — Progress Notes (Signed)
Physical Therapy Treatment Patient Details Name: Sarah [name] MRN: [id number] DOB: 1946/08/05 Today's Date: 09/12/2022   History of Present Illness Pt is a 76 y.o. female with past medical history significant for CAD, A-fib, prolonged hospitalization with complicated perforated sigmoid diverticulitis with abdominal pelvic abscess drainage s/p 2 abdominal pelvic drain placements with IR and ex lap/Hartman's procedurewho presents to the emergency department with generalized weakness and increased output from her JP drain.  She has been at a facility who noted increased output from her JP drain.  Worsening generalized weakness and fatigue. MD assessment includes: Bladder perforation/fistula.    PT Comments    Pt is making very limited progress this date. Pt due for goals review/reassessment. No functional change in status, therefore session written as progress note. Pt reports she is too fatigued for session, however is willing to participate in limited session. At end, pt left in sidelying position as she wanted to nap stating she had "a workout and a half". Able to perform supine there-ex as well ad repositioning in bed. Very low endurance with rest breaks noted. Of note, untouched breakfast tray infront of patient reporting she has no appetite. Encouraged pt to eat at meal times. Will continue to progress as able.   Recommendations for follow up therapy are one component of a multi-disciplinary discharge planning process, led by the attending physician.  Recommendations may be updated based on patient status, additional functional criteria and insurance authorization.  Follow Up Recommendations  Skilled nursing-short term rehab (<3 hours/day) Can patient physically be transported by private vehicle: No   Assistance Recommended at Discharge Frequent or constant Supervision/Assistance  Patient can return home with the following A lot of help with bathing/dressing/bathroom;Help with stairs or ramp for  entrance;Assist for transportation;Assistance with cooking/housework;Two people to help with walking and/or transfers   Equipment Recommendations  Other (comment)    Recommendations for Other Services       Precautions / Restrictions Precautions Precautions: Fall Precaution Comments: colostomy, 2 JP drains Restrictions Weight Bearing Restrictions: No     Mobility  Bed Mobility Overal bed mobility: Needs Assistance Bed Mobility: Rolling Rolling: Min guard         General bed mobility comments: rolled to B sides for chux pad placement. Preferred to stay in sidelying position, pillow placed between knees for bony promience coverage    Transfers                   General transfer comment: Patient refused    Ambulation/Gait                   Stairs             Wheelchair Mobility    Modified Rankin (Stroke Patients Only)       Balance                                            Cognition Arousal/Alertness: Awake/alert Behavior During Therapy: Flat affect Overall Cognitive Status: Within Functional Limits for tasks assessed                                 General Comments: agreeable to limited session, reports she had no appetite with untouched breakfast tray        Exercises Other Exercises Other Exercises: supine  ther-ex performed on B LE including AP, quad sets, SLRs, heel slides, and hip abd/add. 8-10 reps with pt needing rest break 1/2 through set. Min assist required secondary to fatigue.    General Comments        Pertinent Vitals/Pain Pain Assessment Pain Assessment: No/denies pain    Home Living                          Prior Function            PT Goals (current goals can now be found in the care plan section) Acute Rehab PT Goals Patient Stated Goal: To get stronger PT Goal Formulation: With patient Time For Goal Achievement: 09/26/22 Potential to Achieve Goals:  Fair Progress towards PT goals: Progressing toward goals    Frequency    Min 2X/week      PT Plan Current plan remains appropriate    Co-evaluation              AM-PAC PT "6 Clicks" Mobility   Outcome Measure  Help needed turning from your back to your side while in a flat bed without using bedrails?: A Lot Help needed moving from lying on your back to sitting on the side of a flat bed without using bedrails?: A Lot Help needed moving to and from a bed to a chair (including a wheelchair)?: Total Help needed standing up from a chair using your arms (e.g., wheelchair or bedside chair)?: Total Help needed to walk in hospital room?: Total Help needed climbing 3-5 steps with a railing? : Total 6 Click Score: 8    End of Session   Activity Tolerance: Patient limited by fatigue Patient left: in bed;with call bell/phone within reach;with bed alarm set Nurse Communication: Mobility status PT Visit Diagnosis: Muscle weakness (generalized) (M62.81);Difficulty in walking, not elsewhere classified (R26.2)     Time: 1000-1012 PT Time Calculation (min) (ACUTE ONLY): 12 min  Charges:  $Therapeutic Exercise: 8-22 mins                     Greggory Stallion, PT, DPT, GCS [phone]    Walida Cajas 09/12/2022, 11:47 AM

## 2022-09-12 NOTE — Care Management Important Message (Signed)
Important Message  Patient Details  Name: Sarah [name] MRN: [id number] Date of Birth: 1946-03-31   Medicare Important Message Given:  Yes     Dannette Barbara 09/12/2022, 12:42 PM

## 2022-09-12 NOTE — TOC Progression Note (Addendum)
Transition of Care [facility]) - Progression Note    Patient Details  Name: Sarah [name] MRN: [id number] Date of Birth: 1946-04-07  Transition of Care [facility]) CM/SW Contact  Beverly Sessions, RN Phone Number: 09/12/2022, 9:47 AM  Clinical Narrative:     Message sent to MD to determine if patient is medically cleared for SNF   Per MD anticipated Dc tomorrow.  [name] updated    Expected Discharge Plan and Services                                                 Social Determinants of Health (SDOH) Interventions    Readmission Risk Interventions    08/14/2022    3:26 PM 07/28/2022    3:27 PM  Readmission Risk Prevention Plan  Transportation Screening Complete Complete  PCP or Specialist Appt within 3-5 Days  Complete  HRI or [place]  Complete  Social Work Consult for [place] Planning/Counseling  Complete  Palliative Care Screening  Complete  Medication Review [employer]) Complete Complete  PCP or Specialist appointment within 3-5 days of discharge Complete   HRI or [place] Complete   SW Recovery Care/Counseling Consult Complete   [place] Not Applicable

## 2022-09-12 NOTE — Progress Notes (Signed)
Patient oxygen level improved to 98% on 2 liters via nasal canula after one time dose of 20 mg of IV lasix was given as ordered; Dr. Lupita Leash is aware.

## 2022-09-13 DIAGNOSIS — T8143XA Infection following a procedure, organ and space surgical site, initial encounter: Secondary | ICD-10-CM

## 2022-09-13 DIAGNOSIS — Z515 Encounter for palliative care: Secondary | ICD-10-CM

## 2022-09-13 LAB — COMPREHENSIVE METABOLIC PANEL
ALT: 486 U/L — ABNORMAL HIGH (ref 0–44)
AST: 271 U/L — ABNORMAL HIGH (ref 15–41)
Albumin: 2.4 g/dL — ABNORMAL LOW (ref 3.5–5.0)
Alkaline Phosphatase: 267 U/L — ABNORMAL HIGH (ref 38–126)
Anion gap: 15 (ref 5–15)
BUN: 46 mg/dL — ABNORMAL HIGH (ref 8–23)
CO2: 19 mmol/L — ABNORMAL LOW (ref 22–32)
Calcium: 9.3 mg/dL (ref 8.9–10.3)
Chloride: 99 mmol/L (ref 98–111)
Creatinine, Ser: 0.75 mg/dL (ref 0.44–1.00)
GFR, Estimated: >60 mL/min (ref >60)
Glucose, Bld: 177 mg/dL — ABNORMAL HIGH (ref 70–99)
Potassium: 4.7 mmol/L (ref 3.5–5.1)
Sodium: 133 mmol/L — ABNORMAL LOW (ref 135–145)
Total Bilirubin: 2.1 mg/dL — ABNORMAL HIGH (ref 0.3–1.2)
Total Protein: 6.3 g/dL — ABNORMAL LOW (ref 6.5–8.1)

## 2022-09-13 LAB — GLUCOSE, CAPILLARY
Glucose-Capillary: 156 mg/dL — ABNORMAL HIGH (ref 70–99)
Glucose-Capillary: 144 mg/dL — ABNORMAL HIGH (ref 70–99)

## 2022-09-13 MED ORDER — MORPHINE SULFATE (CONCENTRATE) 20 MG/ML PO SOLN: 10.0000 mg | ORAL | 0 refills | Status: AC | PRN

## 2022-09-13 MED ORDER — LORAZEPAM 0.5 MG PO TABS: 0.5000 mg | ORAL_TABLET | ORAL | 0 refills | Status: AC | PRN

## 2022-09-13 MED ORDER — POTASSIUM CHLORIDE CRYS ER 20 MEQ PO TBCR
40.0000 meq | EXTENDED_RELEASE_TABLET | Freq: Once | ORAL | Status: AC
  Administered 2022-09-13: 40 meq via ORAL
  Filled 2022-09-13: qty 2

## 2022-09-13 MED ORDER — METOPROLOL TARTRATE 25 MG PO TABS: 25.0000 mg | ORAL_TABLET | Freq: Two times a day (BID) | ORAL | Status: DC

## 2022-09-13 MED ORDER — METOPROLOL TARTRATE 25 MG PO TABS
12.5000 mg | ORAL_TABLET | Freq: Two times a day (BID) | ORAL | Status: DC
  Administered 2022-09-13: 12.5 mg via ORAL
  Filled 2022-09-13: qty 1

## 2022-09-13 MED ORDER — FUROSEMIDE 10 MG/ML IJ SOLN: 40.0000 mg | Freq: Two times a day (BID) | INTRAMUSCULAR | Status: DC

## 2022-09-13 NOTE — Progress Notes (Signed)
Manufacturing engineer [facility]) Hospital Liaison Note   Received request from Transitions of Care Manager, Colletta [place], for hospice services at home after discharge. Chart and patient information under review by [facility] physician. Hospice eligibility approved.   Patient set to DC shortly with [facility]. MSW has notified appropriate [facility] staff & [facility] staff will f/u upon discharge.   Please send signed and completed DNR home with patient/family. Please provide prescriptions at discharge as needed to ensure ongoing symptom management.    [facility] information and contact numbers given to family & above information shared with TOC.   Please call with any questions/concerns.    Thank you for the opportunity to participate in this patient's care.   Daphene Calamity, MSW [facility] Liaison  [phone]

## 2022-09-13 NOTE — Discharge Summary (Signed)
Physician Discharge Summary   Patient: Sarah [name] MRN: [id number] DOB: 1946-05-16  Admit date:     08/30/2022  Discharge date: 09/13/22  Discharge Physician: Max Sane   PCP: Rusty Aus, MD   Recommendations at discharge:    [facility]  Discharge Diagnoses: Principal Problem:   Bladder fistula Active Problems:   Elevated LFTs   Acute urinary retention   Protein-calorie malnutrition, severe   Atrial fibrillation with RVR ([place])   Uncontrolled type 2 diabetes mellitus with hyperglycemia, without long-term current use of insulin (HCC)   Hypothyroidism   Postprocedural intraabdominal abscess   Hospice care   Acute hepatitis  Hospital Course: 76-yof w/ DM2, HTN, HLD, chronic systolic CHF, thyroid cancer, hypothyroidism, recent history of diverticulitis with abscess formation  In July 2023, patient was diagnosed with diverticulitis and abscess.  Underwent IR guided drain placement discharged with symptoms did not improve. 07/19/2022, underwent scheduled laparoscopic sigmoidectomy and Hartman's procedure.  Pathology showed invasive colorectal adenocarcinoma, margins negative, nodes negative. Postop complications, patient developed rapid atrial fibrillation, hypotension and non-STEMI with heart failure and ejection fraction now of 25%.  Also found to have extensive CAD, but not felt to be candidate for CABG given comorbidities.   9/4, she was discharged to a SNF. 9/14, readmitted for Pseudomonas infection midline open wound.  Also found to have intra-abdominal abscess, drained potentially by IR.  During that hospitalization, patient developed urinary retention and was discharged on Foley catheter. 9/28, Foley catheter was removed at urologist office in the morning. However per documentation, she was not brought back in the afternoon from SNF for follow-up imaging.  It is not clear if patient was able to void after Foley catheter was removed.   10/3, patient was sent back  from SNF for increased JP drainage.  CT scan showed bladder distention.  After Foley catheter was placed, there was marked decrease in output from her JP drain.  The fluid sample obtained from around JP fluid noted a creatinine of 21.6 indicated of urine. 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  Patient was admitted to [facility] service Over the course of the next several days, patient showed signs of improvement and plan was to discharge her back to skilled nursing however labs done on morning of 10/10 noted significantly elevated transaminases with AST of 786 and ALT of 442.  Previously normal LFTs.  GI consulted with consideration for possibly drug-induced hepatitis.  Viral hepatitis panel unremarkable.    10/17: family & patient agreed for [facility]ice [facility]  Assessment and Plan: * Bladder fistula Foley catheter in place allowing time for healing.  Elevated LFTs [facility]/Hospice now  Acute urinary retention Foley catheter placed to allow fistula to heal [facility] now  Uncontrolled type 2 diabetes mellitus with hyperglycemia, without long-term current use of insulin (HCC) Atrial fibrillation with RVR (HCC) Protein-calorie malnutrition, severe Hypothyroidism Continue Synthroid  Goals of care: [facility]ospice now       Consultants: Cardio Disposition: Hospice care Diet recommendation:  Discharge Diet Orders (From admission, onward)     Start     Ordered   09/13/22 0000  Diet - low sodium heart healthy        09/13/22 0950           Carb modified diet DISCHARGE MEDICATION: Allergies as of 09/13/2022       Reactions   Penicillins Anaphylaxis   TOLERATED CEFOTETAN 06/2022   Sulfa Antibiotics Rash  Medication List     STOP taking these medications    acetaminophen 325 MG tablet Commonly known as: TYLENOL   atorvastatin 10 MG tablet Commonly known as: LIPITOR   atorvastatin 40 MG  tablet Commonly known as: LIPITOR   furosemide 40 MG tablet Commonly known as: LASIX   glimepiride 4 MG tablet Commonly known as: AMARYL   lisinopril 2.5 MG tablet Commonly known as: ZESTRIL   potassium chloride SA 20 MEQ tablet Commonly known as: KLOR-CON M   spironolactone 25 MG tablet Commonly known as: ALDACTONE       TAKE these medications    amiodarone 200 MG tablet Commonly known as: PACERONE Take 1 tablet (200 mg total) by mouth daily.   apixaban 2.5 MG Tabs tablet Commonly known as: ELIQUIS Take 1 tablet (2.5 mg total) by mouth 2 (two) times daily.   ascorbic acid 500 MG tablet Commonly known as: VITAMIN C Take 1 tablet (500 mg total) by mouth 2 (two) times daily.   aspirin 81 MG chewable tablet Chew 1 tablet (81 mg total) by mouth daily.   cyanocobalamin 1000 MCG tablet Take 1,000 mcg by mouth daily.   docusate sodium 100 MG capsule Commonly known as: COLACE Take 1 capsule (100 mg total) by mouth 2 (two) times daily as needed for mild constipation.   feeding supplement Liqd Take 237 mLs by mouth 3 (three) times daily between meals.   ipratropium 0.02 % nebulizer solution Commonly known as: ATROVENT Take 2.5 mLs (0.5 mg total) by nebulization 2 (two) times daily.   ipratropium-albuterol 0.5-2.5 (3) MG/3ML Soln Commonly known as: DUONEB Take 3 mLs by nebulization every 6 (six) hours as needed.   leptospermum manuka honey Pste paste Apply 1 Application topically daily. Apply to sacral wound, top with saline moistened gauze dressing, dry dressing and secure with silicone bordered foam. Apply thin layer (3 mm) to wound.   levalbuterol 0.63 MG/3ML nebulizer solution Commonly known as: XOPENEX Take 3 mLs (0.63 mg total) by nebulization 2 (two) times daily.   levothyroxine 137 MCG tablet Commonly known as: SYNTHROID Take 137 mcg by mouth daily before breakfast.   LORazepam 0.5 MG tablet Commonly known as: ATIVAN Take 1 tablet (0.5 mg total) by  mouth every 4 (four) hours as needed for anxiety. May crush, mix with water and give sublingually if needed.   magnesium oxide 400 (240 Mg) MG tablet Commonly known as: MAG-OX Take 400 mg by mouth daily.   metoprolol tartrate 25 MG tablet Commonly known as: LOPRESSOR Take 1 tablet (25 mg total) by mouth 3 (three) times daily.   morphine 20 MG/ML concentrated solution Commonly known as: ROXANOL Take 0.5 mLs (10 mg total) by mouth every 4 (four) hours as needed for severe pain, breakthrough pain, anxiety or shortness of breath. May give sublingually if needed.   multivitamin with minerals Tabs tablet Take 1 tablet by mouth daily.   ondansetron 4 MG disintegrating tablet Commonly known as: ZOFRAN-ODT Take 1 tablet (4 mg total) by mouth every 8 (eight) hours as needed for nausea or vomiting.   sertraline 100 MG tablet Commonly known as: ZOLOFT Take 100 mg by mouth daily.   venlafaxine 37.5 MG tablet Commonly known as: EFFEXOR Take 37.5 mg by mouth daily.   zinc sulfate 220 (50 Zn) MG capsule Take 1 capsule (220 mg total) by mouth daily.               Discharge Care Instructions  (From admission, onward)  Start     Ordered   09/13/22 0000  Discharge wound care:       Comments: As above   09/13/22 0950            Follow-up [place]. Call on 09/08/2022.   Why: [facility] CALL TO SEE IF APPOINTMENT CAN BE DONE AT SAME TIME OF CT CYSTOGRAM ALREADY SCHEDULED. Needs followup for possible removal of upper drain.  NOT LOWER DRAIN Contact information: [place] [address] [phone]         Benjamine Sprague, DO Follow up in 1 week(s).   Specialty: Surgery Why: for midline wound check Contact information: [place] [address] [phone]         Abbie Sons, MD Follow up on 09/08/2022.   Specialty: Urology Why: CT Cystogram on 10/12 at 8:30am at the [facility] and then a follow up with Dr. Bernardo Heater 's office at 9:30am Contact information: [place] [address] [phone]                Discharge Exam: There were no vitals filed for this visit. General exam: alert awake, frail elderly appears chronically debilitated HEENT:Oral mucosa moist, Ear/Nose WNL grossly Respiratory system: bilaterally clear BS, no use of accessory muscle Cardiovascular system: S1 & S2 +, No JVD. Gastrointestinal system: Abdomen soft,NT,ND, BS+ Foley in place along with LLQ JP drain in place for 30 days and colostomy. Nervous System:Alert, awake, moving extremities. Extremities: LE edema neg,distal peripheral pulses palpable.  Skin: No rashes,no icterus. MSK: thin muscle bulk,tone, power  Condition at discharge: poor  The results of significant diagnostics from this hospitalization (including imaging, microbiology, ancillary and laboratory) are listed below for reference.   Imaging Studies: DG Chest Port 1 View  Result Date: 09/12/2022 CLINICAL DATA:  Shortness of breath EXAM: PORTABLE CHEST 1 VIEW COMPARISON:  07/26/2022 and CT chest from 08/16/2022 FINDINGS: Bilateral consolidation some mild relative sparing of the left upper lobe noted. A component of layering pleural effusions is suspected. Right perihilar and left basilar airspace opacity mildly increased from 07/26/2022. Atherosclerotic calcification of the aortic arch. Borderline enlargement of the cardiopericardial silhouette. IMPRESSION: 1. Bilateral airspace opacities, mildly increased from 07/26/2022. 2. A component of layering pleural effusions is suspected. 3. Borderline enlargement of the cardiopericardial silhouette. 4. Atherosclerotic calcification of the aortic arch. Electronically Signed   By: Van Clines M.D.   On: 09/12/2022 17:31   US Abdomen Limited RUQ (LIVER/GB)  Result Date: 09/07/2022 CLINICAL DATA:  Elevated LFTs EXAM: ULTRASOUND  ABDOMEN LIMITED RIGHT UPPER QUADRANT COMPARISON:  CT AP 08/30/22 FINDINGS: Gallbladder: Status post cholecystectomy. Common bile duct: Diameter: 7 mm, which is normal in the setting of cholecystectomy. Liver: No focal lesion identified. Increased echogenicity, as can be seen with hepatic steatosis. Portal vein is patent on color Doppler imaging with normal direction of blood flow towards the liver. Other: Moderate right sided pleural effusion, as seen on CT AP 08/30/22. IMPRESSION: 1. Increased echogenicity of the liver, as can be seen with hepatic steatosis. 2. Moderate right sided pleural effusion, as seen on CT 08/30/22. Electronically Signed   By: Marin Roberts M.D.   On: 09/07/2022 15:03   CT Abdomen Pelvis W Contrast  Result Date: 08/30/2022 CLINICAL DATA:  Worsening abdominal pain. Follow-up abscess. Recent colostomy. Increased drainage from percutaneous drain. Colon cancer. * Tracking Code: BO * EXAM: CT ABDOMEN AND PELVIS  WITH CONTRAST TECHNIQUE: Multidetector CT imaging of the abdomen and pelvis was performed using the standard protocol following bolus administration of intravenous contrast. RADIATION DOSE REDUCTION: This exam was performed according to the departmental dose-optimization program which includes automated exposure control, adjustment of the mA and/or kV according to patient size and/or use of iterative reconstruction technique. CONTRAST:  174m OMNIPAQUE IOHEXOL 300 MG/ML  SOLN COMPARISON:  08/16/2022 FINDINGS: Lower Chest: No significant change in small to moderate bilateral pleural effusions and dependent atelectasis. Hepatobiliary: No hepatic masses identified. Prior cholecystectomy. No evidence of biliary obstruction. Pancreas:  No mass or inflammatory changes. Spleen: Within normal limits in size and appearance. Adrenals/Urinary Tract: No suspicious masses identified. No evidence of ureteral calculi or hydronephrosis. Stomach/Bowel: Postop changes again seen from sigmoid colon  resection with descending colostomy. Evidence of bowel obstruction. Percutaneous drainage catheter remains in place in the left pelvis. A small rim enhancing fluid collection is seen in the left pelvis adjacent to the catheter which measures 3.3 x 2.0 cm and is new since previous study. Several other loculated pelvic fluid collections have resolved since prior exam. A persistent gas and fluid collection is seen in the left iliopsoas region which measures 4.7 by 3.6 cm, without significant change since prior study. Vascular/Lymphatic: No pathologically enlarged lymph nodes. No acute vascular findings. Aortic atherosclerotic calcification incidentally noted. Reproductive: Prior hysterectomy noted. Adnexal regions are unremarkable in appearance. Other:  None. Musculoskeletal:  No suspicious bone lesions identified. IMPRESSION: Postop changes from sigmoid colectomy with descending colostomy. New 3.3 cm rim enhancing fluid collection in left pelvis adjacent to the pigtail drainage catheter. Stable 4.7 cm gas and fluid collection in left iliopsoas region. Stable small to moderate bilateral pleural effusions and dependent atelectasis. Aortic Atherosclerosis (ICD10-I70.0). Electronically Signed   By: JMarlaine HindM.D.   On: 08/30/2022 13:39   CT CHEST ABDOMEN PELVIS W CONTRAST  Result Date: 08/16/2022 CLINICAL DATA:  Colon cancer, staging EXAM: CT CHEST, ABDOMEN, AND PELVIS WITH CONTRAST TECHNIQUE: Multidetector CT imaging of the chest, abdomen and pelvis was performed following the standard protocol during bolus administration of intravenous contrast. RADIATION DOSE REDUCTION: This exam was performed according to the departmental dose-optimization program which includes automated exposure control, adjustment of the mA and/or kV according to patient size and/or use of iterative reconstruction technique. CONTRAST:  1028mOMNIPAQUE IOHEXOL 300 MG/ML  SOLN COMPARISON:  08/13/2022, 08/11/2022 FINDINGS: CT CHEST FINDINGS  Cardiovascular: The heart is enlarged without pericardial effusion. No evidence of thoracic aortic aneurysm or dissection. Diffuse atherosclerosis of the aorta and coronary vasculature. Mediastinum/Nodes: No enlarged mediastinal, hilar, or axillary lymph nodes. Thyroid gland, trachea, and esophagus demonstrate no significant findings. Lungs/Pleura: There are small bilateral pleural effusions and dependent lower lobe atelectasis, increased since prior exam. Areas of nodular airspace disease are seen throughout the mid and upper lung zones, likely inflammatory or infectious. Close follow-up is warranted to assess resolution in this patient with a history of colon cancer. No pneumothorax. The central airways are patent. Musculoskeletal: There are no acute or destructive bony lesions. Reconstructed images demonstrate no additional findings. CT ABDOMEN PELVIS FINDINGS Hepatobiliary: No focal liver abnormality is seen. Status post cholecystectomy. No biliary dilatation. Pancreas: Unremarkable. No pancreatic ductal dilatation or surrounding inflammatory changes. Spleen: Normal in size without focal abnormality. Adrenals/Urinary Tract: Stable 4 mm nonobstructing right renal calculus. Otherwise the kidneys are unremarkable. No obstructive uropathy. Lack of excretion of contrast on delayed imaging with persistent bilateral nephrogram could reflect underlying acute renal insufficiency. Please correlate with  laboratory analysis. Bladder is partially decompressed with an indwelling Foley catheter. The adrenals are unremarkable. Stomach/Bowel: Left lower quadrant ostomy again identified, with no evidence of bowel obstruction or ileus. Stable mural thickening of the colon at the level of the ostomy. Postsurgical changes are seen from sigmoid colon resection. Remaining portions of the distal sigmoid colon and rectum are moderately distended without focal abnormality. Vascular/Lymphatic: Aortic atherosclerosis. No enlarged abdominal  or pelvic lymph nodes. Reproductive: Status post hysterectomy. No adnexal masses. Other: There are 2 percutaneous pigtail drainage catheters. The more superior catheter is seen within the left paracolic gutter, with complete resolution of the fluid collection seen previously. The second catheter is placed in the region of a multilocular collection in the pelvis. The largest portion of this multilocular collection is decompressed by the pigtail drainage catheter. However, numerous other smaller collections persist. These collections may be interconnected, and continued follow-up is recommended. Measurements are as follows:: Image 87/2, 5.2 x 3.5 cm. Image 94/2, 4.8 x 4.1 cm. Image 96/2, 4.4 x 1.6 cm. Postsurgical changes are seen from midline laparotomy, with healing anterior abdominal wound. Musculoskeletal: No acute or destructive bony lesions. Reconstructed images demonstrate no additional findings. IMPRESSION: 1. No evidence of metastatic disease. 2. Patchy bilateral nodular airspace disease most consistent with multifocal pneumonia or inflammation. Continued follow-up is recommended to ensure resolution and exclude metastatic disease. 3. Increased bilateral pleural effusions, with dependent lower lobe atelectasis. 4. Pigtail drainage catheters as above. The multilocular pelvic collection seen on prior study is partially decompressed by the drainage catheter, with remaining likely interconnected fluid collections as above. Continued follow-up is recommended. Complete resolution of the left paracolic collection after pigtail catheter placement. 5. Continued mural thickening of the colon at the ostomy site left lower quadrant, which may reflect postsurgical edema. No bowel obstruction or ileus. 6. Stable nonobstructing right renal calculus. 7.  Aortic Atherosclerosis (ICD10-I70.0). Electronically Signed   By: Randa Ngo M.D.   On: 08/16/2022 19:44    Microbiology: Results for orders placed or performed  during the hospital encounter of 08/30/22  Blood culture (routine x 2)     Status: None   Collection Time: 08/30/22 11:39 AM   Specimen: BLOOD  Result Value Ref Range Status   Specimen Description BLOOD RIGHT ANTECUBITAL  Final   Special Requests BOTTLES DRAWN AEROBIC AND ANAEROBIC [place]  Final   Culture   Final    NO GROWTH 5 DAYS Performed at [facility], [address] [place], [place] [address]    Report Status 09/04/2022 FINAL  Final  Blood culture (routine x 2)     Status: None   Collection Time: 08/30/22  3:51 PM   Specimen: BLOOD  Result Value Ref Range Status   Specimen Description BLOOD BLOOD LEFT ARM  Final   Special Requests   Final    BOTTLES DRAWN AEROBIC AND ANAEROBIC Blood Culture adequate volume   Culture   Final    NO GROWTH 5 DAYS Performed at [facility], [address] [place], [place] [address]    Report Status 09/04/2022 FINAL  Final    Labs: CBC: Recent Labs  Lab 09/11/22 0449 09/12/22 0509  WBC 6.2 8.7  NEUTROABS 4.7 7.1  HGB 11.2* 11.4*  HCT 35.1* 37.4  MCV 99.2 102.2*  PLT 172 505   Basic Metabolic Panel: Recent Labs  Lab 09/08/22 0458 09/09/22 0528 09/10/22 0642 09/11/22 0449 09/12/22 0509  NA 135 135 135 132* 134*  K 4.7 4.6 3.5 3.7  3.8  CL 101 100 101 103 100  CO2 '22 23 27 23 26  '$ GLUCOSE 109* 111* 91 140* 309*  BUN 41* 49* 48* 39* 41*  CREATININE 0.83 0.78 0.69 0.56 0.57  CALCIUM 9.2 9.2 8.5* 8.1* 8.7*   Liver Function Tests: Recent Labs  Lab 09/08/22 0458 09/09/22 0528 09/10/22 0642 09/11/22 0449 09/12/22 0509  AST 1,298* 1,838* 1,512* 751* 358*  ALT 767* 1,024* 991* 752* 579*  ALKPHOS 316* 303* 280* 251* 253*  BILITOT 1.6* 2.2* 1.6* 1.2 0.9  PROT 6.6 6.2* 5.7* 6.0* 6.2*  ALBUMIN 2.7* 2.6* 2.3* 2.3* 2.5*   CBG: Recent Labs  Lab 09/12/22 0823 09/12/22 1125 09/12/22 1631 09/12/22 2107 09/13/22 0745  GLUCAP 287* 251* 188* 152* 156*    Discharge time spent: greater than 30  minutes.  Signed: Max Sane, MD [facility] 09/13/2022

## 2022-09-13 NOTE — Plan of Care (Signed)

## 2022-09-13 NOTE — TOC Transition Note (Addendum)
Transition of Care [facility]) - CM/SW Discharge Note   Patient Details  Name: [name] MRN: [id number] Date of Birth: August 21, 1946  [facility]) CM/SW Contact:  Beverly Sessions, RN Phone Number: 09/13/2022, 10:22 AM   Clinical Narrative:      MD and [name] in agreement to hospice services at [facility].  [name] states she does not have a preference of agency . Per [facility] their preferred agency is [facility].  Referral made to [facility] with [facility]  Patient will DC UE:[facility] Anticipated DC date:09/13/22  Family notified:[name] Transport LK:[id number]  Per MD patient ready for DC to . RN, patient, patient's family, and facility notified of DC. Discharge Summary sent to facility. RN given number for report. DC packet on chart and signed DNR. Ambulance transport requested for patient.  TOC signing off.  Isaias Cowman [facility] [phone]         Patient Goals and CMS Choice        Discharge Placement                       Discharge Plan and Services                                     Social Determinants of Health (SDOH) Interventions     Readmission Risk Interventions    08/14/2022    3:26 PM 07/28/2022    3:27 PM  Readmission Risk Prevention Plan  Transportation Screening Complete Complete  PCP or Specialist Appt within 3-5 Days  Complete  HRI or [place]  Complete  Social Work Consult for [place] Planning/Counseling  Complete  Palliative Care Screening  Complete  Medication Review Press photographer) Complete Complete  PCP or Specialist appointment within 3-5 days of discharge Complete   HRI or [place] Complete   SW Recovery Care/Counseling Consult Complete   [place] Not Applicable

## 2022-09-13 NOTE — Progress Notes (Signed)
[place] NOTE       Patient ID: ANH BIGOS MRN: [id number] DOB/AGE: 29-Mar-1946 76 y.o.  Admit date: 08/30/2022 Referring Physician Dr. Lysle Pearl Primary Physician Dr. Sabra Heck Primary Cardiologist Dr. Saralyn Pilar Reason for Consultation transaminitis, acute on chronic heart failure  HPI: Sarah [name] is a 76yoF with a PMH of hypertension, Graves' disease s/p XRT, type 2 diabetes, IBS, diverticulitis with abscess formation s/p CT-guided drain placement June 2023, s/p Hartman's procedure 07/2022 with pathology revealing adenocarcinoma and hospital course complicated by perioperative NSTEMI (LHC-CTO mid LCx, 75% mid LAD unamenable to intervention), acute HFrEF (EF 25-30%%), and paroxysmal atrial fibrillation.  She was discharged to rehab, and presented back to [facility] 08/30/2022 with increased JP drainage and urinary retention concerning for possible bladder fistula.  Hospital course has been complicated by transaminitis, felt to be related to venous congestion versus medication induced (Cipro, amiodarone), has improved with cessation of hepatotoxic medicines and IV diuresis.  Interval history: -LFTs continue to improve with cessation of amiodarone and with IV diuresis -Repeat CMP today pending, I ordered Lasix 40 mg at 1200 and 1800 -continues to feel tired and short of breath. No chest pain. Appears significantly weak.   Review of systems complete and found to be negative unless listed above     Past Medical History:  Diagnosis Date   Arthritis    hands   Cancer ([place])    thyroid- radiation   Diabetes mellitus without complication ([place])    Hyperlipidemia    Hypertension    Hypothyroidism    IBS (irritable bowel syndrome)     Past Surgical History:  Procedure Laterality Date   ABDOMINAL HYSTERECTOMY     CATARACT EXTRACTION W/PHACO Left 12/21/2016   Procedure: CATARACT EXTRACTION PHACO AND INTRAOCULAR LENS PLACEMENT ([place])  left eye;  Surgeon: Leandrew Koyanagi, MD;   Location: [place];  Service: Ophthalmology;  Laterality: Left;  Diabetic - oral meds   CATARACT EXTRACTION W/PHACO Right 01/25/2017   Procedure: CATARACT EXTRACTION PHACO AND INTRAOCULAR LENS PLACEMENT ([place])  right diabetic;  Surgeon: Leandrew Koyanagi, MD;  Location: [place];  Service: Ophthalmology;  Laterality: Right;  diabetic - oral meds   CHOLECYSTECTOMY     COLONOSCOPY WITH PROPOFOL N/A 10/26/2016   Procedure: COLONOSCOPY WITH PROPOFOL;  Surgeon: Manya Silvas, MD;  Location: [facility] ENDOSCOPY;  Service: Endoscopy;  Laterality: N/A;   IR RADIOLOGIST EVAL & MGMT  06/14/2022   KNEE SURGERY Right    LEFT HEART CATH AND CORONARY ANGIOGRAPHY N/A 07/29/2022   Procedure: LEFT HEART CATH AND CORONARY ANGIOGRAPHY;  Surgeon: Yolonda Kida, MD;  Location: [place] CV LAB;  Service: Cardiovascular;  Laterality: N/A;    Medications Prior to Admission  Medication Sig Dispense Refill Last Dose   acetaminophen (TYLENOL) 325 MG tablet Take 2 tablets (650 mg total) by mouth every 4 (four) hours as needed for headache or mild pain.   08/30/2022 at 0925   amiodarone (PACERONE) 200 MG tablet Take 1 tablet (200 mg total) by mouth daily. 30 tablet 1 08/30/2022 at 0925   apixaban (ELIQUIS) 2.5 MG TABS tablet Take 1 tablet (2.5 mg total) by mouth 2 (two) times daily. 60 tablet 2 08/30/2022 at 0925   ascorbic acid (VITAMIN C) 500 MG tablet Take 1 tablet (500 mg total) by mouth 2 (two) times daily. 60 tablet 1 08/30/2022 at 0925   aspirin 81 MG chewable tablet Chew 1 tablet (81 mg total) by mouth daily. 30 tablet 1 08/30/2022  at 0925   atorvastatin (LIPITOR) 10 MG tablet Take 10 mg by mouth daily.   08/30/2022 at 0730   cyanocobalamin 1000 MCG tablet Take 1,000 mcg by mouth daily.   08/30/2022 at 0925   furosemide (LASIX) 40 MG tablet Take 40 mg by mouth daily.   08/30/2022 at 0925   glimepiride (AMARYL) 4 MG tablet Take 4 mg by mouth daily with breakfast.   08/30/2022 at 0925    ipratropium (ATROVENT) 0.02 % nebulizer solution Take 2.5 mLs (0.5 mg total) by nebulization 2 (two) times daily. 75 mL 12 08/30/2022 at 0953   leptospermum manuka honey (MEDIHONEY) PSTE paste Apply 1 Application topically daily. Apply to sacral wound, top with saline moistened gauze dressing, dry dressing and secure with silicone bordered foam. Apply thin layer (3 mm) to wound. 44 mL 3 prn at prn   levalbuterol (XOPENEX) 0.63 MG/3ML nebulizer solution Take 3 mLs (0.63 mg total) by nebulization 2 (two) times daily. 3 mL 12 08/30/2022 at 0953   levothyroxine (SYNTHROID) 137 MCG tablet Take 137 mcg by mouth daily before breakfast.   08/30/2022 at 0540   lisinopril (ZESTRIL) 2.5 MG tablet Take 1 tablet (2.5 mg total) by mouth daily. 30 tablet 1 08/30/2022 at 0925   magnesium oxide (MAG-OX) 400 (240 Mg) MG tablet Take 400 mg by mouth daily.   08/30/2022 at 0925   metoprolol tartrate (LOPRESSOR) 25 MG tablet Take 1 tablet (25 mg total) by mouth 3 (three) times daily. 90 tablet 1 08/30/2022 at 0925   Multiple Vitamin (MULTIVITAMIN WITH MINERALS) TABS tablet Take 1 tablet by mouth daily. 30 tablet 1 08/30/2022 at 0925   potassium chloride SA (KLOR-CON M) 20 MEQ tablet Take 20 mEq by mouth 2 (two) times daily.   08/30/2022 at 0925   sertraline (ZOLOFT) 100 MG tablet Take 100 mg by mouth daily.   08/30/2022 at 0925   spironolactone (ALDACTONE) 25 MG tablet Take 0.5 tablets (12.5 mg total) by mouth daily. 30 tablet 1 08/30/2022 at 0925   venlafaxine (EFFEXOR) 37.5 MG tablet Take 37.5 mg by mouth daily.   08/30/2022 at 0925   zinc sulfate 220 (50 Zn) MG capsule Take 1 capsule (220 mg total) by mouth daily. 30 capsule 1 08/30/2022 at 0925   atorvastatin (LIPITOR) 40 MG tablet Take 1 tablet (40 mg total) by mouth at bedtime. (Patient not taking: Reported on 08/30/2022) 30 tablet 1 Not Taking   feeding supplement (ENSURE ENLIVE / ENSURE PLUS) LIQD Take 237 mLs by mouth 3 (three) times daily between meals. 237 mL 12     ipratropium-albuterol (DUONEB) 0.5-2.5 (3) MG/3ML SOLN Take 3 mLs by nebulization every 6 (six) hours as needed. 360 mL  prn at prn   ondansetron (ZOFRAN-ODT) 4 MG disintegrating tablet Take 1 tablet (4 mg total) by mouth every 8 (eight) hours as needed for nausea or vomiting. 12 tablet 0 prn at prn   Social History   Socioeconomic History   Marital status: Married    Spouse name: Not on file   Number of children: Not on file   Years of education: Not on file   Highest education level: Not on file  Occupational History   Not on file  Tobacco Use   Smoking status: Never   Smokeless tobacco: Never   Tobacco comments:    social as teenager  Vaping Use   Vaping Use: Never used  Substance and Sexual Activity   Alcohol use: No   Drug use: No  Sexual activity: Not on file  Other Topics Concern   Not on file  Social History Narrative   Not on file   Social Determinants of Health   Financial Resource Strain: Not on file  Food Insecurity: No Food Insecurity (08/16/2022)   Hunger Vital Sign    Worried About Running Out of Food in the Last Year: Never true    Ran Out of Food in the Last Year: Never true  Transportation Needs: No Transportation Needs (08/16/2022)   PRAPARE - Hydrologist (Medical): No    Lack of Transportation (Non-Medical): No  Physical Activity: Not on file  Stress: Not on file  Social Connections: Not on file  Intimate Partner Violence: Not At Risk (08/16/2022)   Humiliation, Afraid, Rape, and Kick questionnaire    Fear of Current or Ex-Partner: No    Emotionally Abused: No    Physically Abused: No    Sexually Abused: No    Family History  Problem Relation Age of Onset   Breast cancer Neg Hx      Vitals:   09/12/22 2013 09/12/22 2346 09/13/22 0433 09/13/22 0743  BP: (!) 95/56 123/77 117/64 109/65  Pulse: 70 76 79 76  Resp: '20 20 20 16  '$ Temp: 98.1 F (36.7 C)  98 F (36.7 C) 97.8 F (36.6 C)  TempSrc: Oral     SpO2: 100%  91% 91% 94%    PHYSICAL EXAM General: weak and frail appearing caucasian female, short of breath appearing.Marland Kitchen HEENT:  Normocephalic and atraumatic. Neck:  No JVD.  Lungs: short of breath appearing on 3L by [place]. Decreased breath sounds with bibasilar crackles  Heart: HRRR . Normal S1 and S2 without gallops or murmurs.  Abdomen: Non-distended appearing.  Msk: generalized weakness.  Extremities: Warm and well perfused. No clubbing, cyanosis. No peripheral edema.  Neuro: Alert and oriented X 3. Psych:  Answers questions appropriately.   Labs: Basic Metabolic Panel: Recent Labs    09/11/22 0449 09/12/22 0509  NA 132* 134*  K 3.7 3.8  CL 103 100  CO2 23 26  GLUCOSE 140* 309*  BUN 39* 41*  CREATININE 0.56 0.57  CALCIUM 8.1* 8.7*   Liver Function Tests: Recent Labs    09/11/22 0449 09/12/22 0509  AST 751* 358*  ALT 752* 579*  ALKPHOS 251* 253*  BILITOT 1.2 0.9  PROT 6.0* 6.2*  ALBUMIN 2.3* 2.5*   No results for input(s): "LIPASE", "AMYLASE" in the last 72 hours. CBC: Recent Labs    09/11/22 0449 09/12/22 0509  WBC 6.2 8.7  NEUTROABS 4.7 7.1  HGB 11.2* 11.4*  HCT 35.1* 37.4  MCV 99.2 102.2*  PLT 172 165   Cardiac Enzymes: No results for input(s): "CKTOTAL", "CKMB", "CKMBINDEX", "TROPONINIHS" in the last 72 hours. BNP: No results for input(s): "BNP" in the last 72 hours. D-Dimer: No results for input(s): "DDIMER" in the last 72 hours. Hemoglobin A1C: No results for input(s): "HGBA1C" in the last 72 hours. Fasting Lipid Panel: No results for input(s): "CHOL", "HDL", "LDLCALC", "TRIG", "CHOLHDL", "LDLDIRECT" in the last 72 hours. Thyroid Function Tests: No results for input(s): "TSH", "T4TOTAL", "T3FREE", "THYROIDAB" in the last 72 hours.  Invalid input(s): "FREET3" Anemia Panel: No results for input(s): "VITAMINB12", "FOLATE", "FERRITIN", "TIBC", "IRON", "RETICCTPCT" in the last 72 hours.   Radiology: [facility] Chest Port 1 View  Result Date: 09/12/2022 CLINICAL  DATA:  Shortness of breath EXAM: PORTABLE CHEST 1 VIEW COMPARISON:  07/26/2022 and CT chest from 08/16/2022  FINDINGS: Bilateral consolidation some mild relative sparing of the left upper lobe noted. A component of layering pleural effusions is suspected. Right perihilar and left basilar airspace opacity mildly increased from 07/26/2022. Atherosclerotic calcification of the aortic arch. Borderline enlargement of the cardiopericardial silhouette. IMPRESSION: 1. Bilateral airspace opacities, mildly increased from 07/26/2022. 2. A component of layering pleural effusions is suspected. 3. Borderline enlargement of the cardiopericardial silhouette. 4. Atherosclerotic calcification of the aortic arch. Electronically Signed   By: Van Clines M.D.   On: 09/12/2022 17:31   US Abdomen Limited RUQ (LIVER/GB)  Result Date: 09/07/2022 CLINICAL DATA:  Elevated LFTs EXAM: ULTRASOUND ABDOMEN LIMITED RIGHT UPPER QUADRANT COMPARISON:  CT AP 08/30/22 FINDINGS: Gallbladder: Status post cholecystectomy. Common bile duct: Diameter: 7 mm, which is normal in the setting of cholecystectomy. Liver: No focal lesion identified. Increased echogenicity, as can be seen with hepatic steatosis. Portal vein is patent on color Doppler imaging with normal direction of blood flow towards the liver. Other: Moderate right sided pleural effusion, as seen on CT AP 08/30/22. IMPRESSION: 1. Increased echogenicity of the liver, as can be seen with hepatic steatosis. 2. Moderate right sided pleural effusion, as seen on CT 08/30/22. Electronically Signed   By: Marin Roberts M.D.   On: 09/07/2022 15:03   CT Abdomen Pelvis W Contrast  Result Date: 08/30/2022 CLINICAL DATA:  Worsening abdominal pain. Follow-up abscess. Recent colostomy. Increased drainage from percutaneous drain. Colon cancer. * Tracking Code: BO * EXAM: CT ABDOMEN AND PELVIS WITH CONTRAST TECHNIQUE: Multidetector CT imaging of the abdomen and pelvis was performed using the standard  protocol following bolus administration of intravenous contrast. RADIATION DOSE REDUCTION: This exam was performed according to the departmental dose-optimization program which includes automated exposure control, adjustment of the mA and/or kV according to patient size and/or use of iterative reconstruction technique. CONTRAST:  181m OMNIPAQUE IOHEXOL 300 MG/ML  SOLN COMPARISON:  08/16/2022 FINDINGS: Lower Chest: No significant change in small to moderate bilateral pleural effusions and dependent atelectasis. Hepatobiliary: No hepatic masses identified. Prior cholecystectomy. No evidence of biliary obstruction. Pancreas:  No mass or inflammatory changes. Spleen: Within normal limits in size and appearance. Adrenals/Urinary Tract: No suspicious masses identified. No evidence of ureteral calculi or hydronephrosis. Stomach/Bowel: Postop changes again seen from sigmoid colon resection with descending colostomy. Evidence of bowel obstruction. Percutaneous drainage catheter remains in place in the left pelvis. A small rim enhancing fluid collection is seen in the left pelvis adjacent to the catheter which measures 3.3 x 2.0 cm and is new since previous study. Several other loculated pelvic fluid collections have resolved since prior exam. A persistent gas and fluid collection is seen in the left iliopsoas region which measures 4.7 by 3.6 cm, without significant change since prior study. Vascular/Lymphatic: No pathologically enlarged lymph nodes. No acute vascular findings. Aortic atherosclerotic calcification incidentally noted. Reproductive: Prior hysterectomy noted. Adnexal regions are unremarkable in appearance. Other:  None. Musculoskeletal:  No suspicious bone lesions identified. IMPRESSION: Postop changes from sigmoid colectomy with descending colostomy. New 3.3 cm rim enhancing fluid collection in left pelvis adjacent to the pigtail drainage catheter. Stable 4.7 cm gas and fluid collection in left iliopsoas  region. Stable small to moderate bilateral pleural effusions and dependent atelectasis. Aortic Atherosclerosis (ICD10-I70.0). Electronically Signed   By: JMarlaine HindM.D.   On: 08/30/2022 13:39   CT CHEST ABDOMEN PELVIS W CONTRAST  Result Date: 08/16/2022 CLINICAL DATA:  Colon cancer, staging EXAM: CT CHEST, ABDOMEN, AND PELVIS WITH CONTRAST TECHNIQUE: Multidetector  CT imaging of the chest, abdomen and pelvis was performed following the standard protocol during bolus administration of intravenous contrast. RADIATION DOSE REDUCTION: This exam was performed according to the departmental dose-optimization program which includes automated exposure control, adjustment of the mA and/or kV according to patient size and/or use of iterative reconstruction technique. CONTRAST:  165m OMNIPAQUE IOHEXOL 300 MG/ML  SOLN COMPARISON:  08/13/2022, 08/11/2022 FINDINGS: CT CHEST FINDINGS Cardiovascular: The heart is enlarged without pericardial effusion. No evidence of thoracic aortic aneurysm or dissection. Diffuse atherosclerosis of the aorta and coronary vasculature. Mediastinum/Nodes: No enlarged mediastinal, hilar, or axillary lymph nodes. Thyroid gland, trachea, and esophagus demonstrate no significant findings. Lungs/Pleura: There are small bilateral pleural effusions and dependent lower lobe atelectasis, increased since prior exam. Areas of nodular airspace disease are seen throughout the mid and upper lung zones, likely inflammatory or infectious. Close follow-up is warranted to assess resolution in this patient with a history of colon cancer. No pneumothorax. The central airways are patent. Musculoskeletal: There are no acute or destructive bony lesions. Reconstructed images demonstrate no additional findings. CT ABDOMEN PELVIS FINDINGS Hepatobiliary: No focal liver abnormality is seen. Status post cholecystectomy. No biliary dilatation. Pancreas: Unremarkable. No pancreatic ductal dilatation or surrounding  inflammatory changes. Spleen: Normal in size without focal abnormality. Adrenals/Urinary Tract: Stable 4 mm nonobstructing right renal calculus. Otherwise the kidneys are unremarkable. No obstructive uropathy. Lack of excretion of contrast on delayed imaging with persistent bilateral nephrogram could reflect underlying acute renal insufficiency. Please correlate with laboratory analysis. Bladder is partially decompressed with an indwelling Foley catheter. The adrenals are unremarkable. Stomach/Bowel: Left lower quadrant ostomy again identified, with no evidence of bowel obstruction or ileus. Stable mural thickening of the colon at the level of the ostomy. Postsurgical changes are seen from sigmoid colon resection. Remaining portions of the distal sigmoid colon and rectum are moderately distended without focal abnormality. Vascular/Lymphatic: Aortic atherosclerosis. No enlarged abdominal or pelvic lymph nodes. Reproductive: Status post hysterectomy. No adnexal masses. Other: There are 2 percutaneous pigtail drainage catheters. The more superior catheter is seen within the left paracolic gutter, with complete resolution of the fluid collection seen previously. The second catheter is placed in the region of a multilocular collection in the pelvis. The largest portion of this multilocular collection is decompressed by the pigtail drainage catheter. However, numerous other smaller collections persist. These collections may be interconnected, and continued follow-up is recommended. Measurements are as follows:: Image 87/2, 5.2 x 3.5 cm. Image 94/2, 4.8 x 4.1 cm. Image 96/2, 4.4 x 1.6 cm. Postsurgical changes are seen from midline laparotomy, with healing anterior abdominal wound. Musculoskeletal: No acute or destructive bony lesions. Reconstructed images demonstrate no additional findings. IMPRESSION: 1. No evidence of metastatic disease. 2. Patchy bilateral nodular airspace disease most consistent with multifocal  pneumonia or inflammation. Continued follow-up is recommended to ensure resolution and exclude metastatic disease. 3. Increased bilateral pleural effusions, with dependent lower lobe atelectasis. 4. Pigtail drainage catheters as above. The multilocular pelvic collection seen on prior study is partially decompressed by the drainage catheter, with remaining likely interconnected fluid collections as above. Continued follow-up is recommended. Complete resolution of the left paracolic collection after pigtail catheter placement. 5. Continued mural thickening of the colon at the ostomy site left lower quadrant, which may reflect postsurgical edema. No bowel obstruction or ileus. 6. Stable nonobstructing right renal calculus. 7.  Aortic Atherosclerosis (ICD10-I70.0). Electronically Signed   By: MRanda NgoM.D.   On: 08/16/2022 19:44    ECHO EF  less than 20% global hypo-07/2022  TELEMETRY reviewed by me (LT) 09/13/2022 : sinus rhythm 70s, PVCs  EKG reviewed by me: SR [id number]  Data reviewed by me (LT) 09/13/2022: Admission H&P, hospitalist progress note, CBC, CMP, ordered CMP, I's and O's, vitals  Principal Problem:   Bladder fistula Active Problems:   Protein-calorie malnutrition, severe   Atrial fibrillation with RVR ([place])   Uncontrolled type 2 diabetes mellitus with hyperglycemia, without long-term current use of insulin (HCC)   Hypothyroidism   Elevated LFTs   Acute urinary retention   Acute hepatitis    ASSESSMENT AND PLAN:  Sarah [name] is a 76yoF with a PMH of hypertension, Graves' disease s/p XRT, type 2 diabetes, IBS, diverticulitis with abscess formation s/p CT-guided drain placement June 2023, s/p Hartman's procedure 07/2022 with pathology revealing adenocarcinoma and hospital course complicated by perioperative NSTEMI (LHC-CTO mid LCx, 75% mid LAD unamenable to intervention), acute HFrEF (EF 25-30%%), and paroxysmal atrial fibrillation.  She was discharged to rehab, and presented back to  [facility] 08/30/2022 with increased JP drainage and urinary retention concerning for possible bladder fistula.  Hospital course has been complicated by transaminitis, felt to be related to venous congestion versus medication induced (Cipro, amiodarone), has improved with cessation of hepatotoxic medicines and IV diuresis.  #Acute urinary retention, bladder fistula Management per primary, urology  #Acute hepatitis with transaminitis #Acute on chronic HFrEF Felt to be multifactorial, finally improving after hepatotoxins discontinued (amiodarone, ciprofloxacin, statins) and with IV diuresis -S/p IV Lasix 20 mg x 4 doses with improvement in her LFTs -I ordered 40 mg IV Lasix x2 more doses today and 1 dose in the morning -Check daily CMP -decrease metoprolol tartrate from 25 mg to 12.'5mg'$  twice daily -Consider readdition of low-dose lisinopril if her blood pressure allows  #Three-vessel CAD #Perioperative NSTEMI 07/2022 LHC 07/2022-CTO mid LCx, 75% mid LAD unamenable to PCI, medical therapy for cardiomyopathy was recommended. -Continue aspirin 81 mg daily, statin on hold due to transaminitis  #Paroxysmal A-fib with RVR Remains in sinus rhythm. -Continue metoprolol tartrate for rate control, Eliquis 2.5 mg twice daily for stroke risk reduction, CHA2DS2-VASc 6 (age, sex, chf, htn, MI)   This patient's plan of care was discussed and created with Dr. Nehemiah Massed and he is in agreement.  Signed: Tristan Schroeder , PA-C 09/13/2022, 9:03 AM [facility]

## 2022-09-13 NOTE — Plan of Care (Signed)
Consult noted for Sarah [name]. Upon chart check, patient will be discharging to [facility] with hospice to follow. As goals are set, PMT will sign off. Please reconsult if needed.

## 2022-09-14 ENCOUNTER — Encounter

## 2022-09-14 ENCOUNTER — Encounter: Admit: 2022-09-14 | Discharge: 2022-09-14 | Payer: PRIVATE HEALTH INSURANCE | Primary: Family Medicine

## 2022-09-14 DIAGNOSIS — R7301 Impaired fasting glucose: Secondary | ICD-10-CM

## 2022-09-14 LAB — COMPREHENSIVE METABOLIC PANEL
ALT: 20 U/L (ref 12–65)
AST: 14 U/L — ABNORMAL LOW (ref 15–37)
Albumin/Globulin Ratio: 1 (ref 0.4–1.6)
Albumin: 3.8 g/dL (ref 3.2–4.6)
Alk Phosphatase: 46 U/L — ABNORMAL LOW (ref 50–136)
Anion Gap: 5 mmol/L (ref 2–11)
BUN: 13 MG/DL (ref 8–23)
CO2: 28 mmol/L (ref 21–32)
Calcium: 9.6 MG/DL (ref 8.3–10.4)
Chloride: 108 mmol/L (ref 101–110)
Creatinine: 1 MG/DL (ref 0.6–1.0)
Est, Glom Filt Rate: 58 mL/min/{1.73_m2} — ABNORMAL LOW (ref >60)
Globulin: 4 g/dL (ref 2.8–4.5)
Glucose: 105 mg/dL — ABNORMAL HIGH (ref 65–100)
Potassium: 4.9 mmol/L (ref 3.5–5.1)
Sodium: 141 mmol/L (ref 133–143)
Total Bilirubin: 0.6 MG/DL (ref 0.2–1.1)
Total Protein: 7.8 g/dL (ref 6.3–8.2)

## 2022-09-14 LAB — LIPID PANEL
Chol/HDL Ratio: 2.1
Cholesterol, Total: 141 MG/DL (ref <200)
HDL: 66 MG/DL — ABNORMAL HIGH (ref 40–60)
LDL Calculated: 59.2 MG/DL (ref <100)
Triglycerides: 79 MG/DL (ref 35–150)
VLDL Cholesterol Calculated: 15.8 MG/DL (ref 6.0–23.0)

## 2022-09-14 LAB — CBC WITH AUTO DIFFERENTIAL
Absolute Immature Granulocyte: 0 10*3/uL (ref 0.0–0.5)
Basophils %: 2 % (ref 0.0–2.0)
Basophils Absolute: 0.1 10*3/uL (ref 0.0–0.2)
Eosinophils %: 3 % (ref 0.5–7.8)
Eosinophils Absolute: 0.1 10*3/uL (ref 0.0–0.8)
Hematocrit: 42.3 % (ref 35.8–46.3)
Hemoglobin: 13.8 g/dL (ref 11.7–15.4)
Immature Granulocytes: 0 % (ref 0.0–5.0)
Lymphocytes %: 44 % (ref 13–44)
Lymphocytes Absolute: 1.6 10*3/uL (ref 0.5–4.6)
MCH: 31.6 PG (ref 26.1–32.9)
MCHC: 32.6 g/dL (ref 31.4–35.0)
MCV: 96.8 FL (ref 82–102)
MPV: 10.9 FL (ref 9.4–12.3)
Monocytes %: 17 % — ABNORMAL HIGH (ref 4.0–12.0)
Monocytes Absolute: 0.6 10*3/uL (ref 0.1–1.3)
Neutrophils %: 34 % — ABNORMAL LOW (ref 43–78)
Neutrophils Absolute: 1.2 10*3/uL — ABNORMAL LOW (ref 1.7–8.2)
Platelets: 308 10*3/uL (ref 150–450)
RBC: 4.37 M/uL (ref 4.05–5.2)
RDW: 13 % (ref 11.9–14.6)
WBC: 3.7 10*3/uL — ABNORMAL LOW (ref 4.3–11.1)
nRBC: 0 10*3/uL (ref 0.0–0.2)

## 2022-09-14 LAB — TSH: TSH, 3RD GENERATION: 2.35 u[IU]/mL (ref 0.358–3.740)

## 2022-09-15 ENCOUNTER — Other Ambulatory Visit: Payer: Medicare Other

## 2022-09-15 LAB — HEMOGLOBIN A1C
Hemoglobin A1C: 5.4 % (ref 4.8–5.6)
eAG: 108 mg/dL

## 2022-09-19 ENCOUNTER — Encounter

## 2022-09-19 ENCOUNTER — Encounter
Admit: 2022-09-19 | Discharge: 2022-09-19 | Payer: PRIVATE HEALTH INSURANCE | Attending: Family Medicine | Primary: Family Medicine

## 2022-09-19 ENCOUNTER — Inpatient Hospital Stay: Admit: 2022-09-19 | Payer: PRIVATE HEALTH INSURANCE | Primary: Family Medicine

## 2022-09-19 DIAGNOSIS — E782 Mixed hyperlipidemia: Secondary | ICD-10-CM

## 2022-09-19 DIAGNOSIS — Z1231 Encounter for screening mammogram for malignant neoplasm of breast: Secondary | ICD-10-CM

## 2022-09-19 MED ORDER — ALBUTEROL SULFATE HFA 108 (90 BASE) MCG/ACT IN AERS
108 (90 Base) MCG/ACT | RESPIRATORY_TRACT | 5 refills | Status: AC | PRN
Start: 2022-09-19 — End: 2023-03-21

## 2022-09-19 MED ORDER — RIVAROXABAN 20 MG PO TABS
20 MG | ORAL_TABLET | Freq: Every day | ORAL | 1 refills | Status: AC
Start: 2022-09-19 — End: 2023-03-21

## 2022-09-19 MED ORDER — ATORVASTATIN CALCIUM 40 MG PO TABS
40 MG | ORAL_TABLET | Freq: Every evening | ORAL | 1 refills | Status: AC
Start: 2022-09-19 — End: 2023-03-21

## 2022-09-19 MED ORDER — ALENDRONATE SODIUM 70 MG PO TABS
70 MG | ORAL_TABLET | ORAL | 5 refills | Status: AC
Start: 2022-09-19 — End: 2023-03-21

## 2022-09-19 MED ORDER — FLUTICASONE-SALMETEROL 100-50 MCG/ACT IN AEPB
100-50 MCG/ACT | Freq: Two times a day (BID) | RESPIRATORY_TRACT | 5 refills | Status: DC
Start: 2022-09-19 — End: 2023-01-11

## 2022-09-19 NOTE — Assessment & Plan Note (Signed)
Well-controlled, continue current medications, medication adherence emphasized and lifestyle modifications recommended

## 2022-09-19 NOTE — Assessment & Plan Note (Signed)
Well-controlled, continue current treatment plan, medication adherence emphasized, lifestyle modifications recommended and avoid nsaids.

## 2022-09-19 NOTE — Assessment & Plan Note (Signed)
Well-controlled, lifestyle modifications recommended

## 2022-09-19 NOTE — Progress Notes (Signed)
[facility]  Antoneo Ghrist L. Britta Mccreedy, M.D.  [address]  [place], [place] [address]  Ph No:  [phone]  Fax:  [phone]    CHIEF COMPLAINT:  Chief Complaint   Patient presents with    Cholesterol Problem     Patient takes her medication at night, she is requesting refills.    Asthma     Patient is requesting refills.    Discuss Labs    Discuss Medications     Patient is wanting to know if she can take Fish oil with Xarelto.          IMPRESSION/PLAN    1. Mixed hyperlipidemia  Assessment & Plan:   Well-controlled, continue current medications, medication adherence emphasized and lifestyle modifications recommended  2. Stage 3a chronic kidney disease (HCC)  Assessment & Plan:   Well-controlled, continue current treatment plan, medication adherence emphasized, lifestyle modifications recommended and avoid nsaids.  3. Impaired fasting glucose  Assessment & Plan:   Well-controlled, lifestyle modifications recommended  4. Mild intermittent asthma without complication  5. Encounter for screening mammogram for malignant neoplasm of breast      Labs are reviewed.   Discussed ss of uti.    Suspect she may have some underlying depression.  Reluctant to add additional medication.  Will address further when she returns.  Mammogram today.  Continue current medications at current dose.  Side effects are discussed and pt is advised to call if any adverse reactions.            HISTORY OF PRESENT ILLNESS:  Here for fu and to review labs.  Also fo refills of medications.  Accompanied by daughter.  Indicates that pt is at times "not herself".  She denies any memory loss.  Manages all adls independently.  No falls or injuries.  Continues to care for husband who has parkinsons and ptsd.  Has assistance from daughter and grandaughter.  She otherwise feels fine.  Daughter is concerned about possible uti.  She is asymptomatic.  She denies odor or frequency. No urgency or burning.  No fever or chills.  No mental status  changes.  No back pain.  No other co.          REVIEW OF SYSTEMS:  Review of Systems    Review of systems is as stated above, otherwise is negative.    PHYSICAL EXAM:  Vital Signs - BP (!) 130/55 (Site: Left Upper Arm, Position: Sitting, Cuff Size: Large Adult)   Pulse 81   Temp 97.4 F (36.3 C) (Temporal)   Ht 1.524 m (5')   Wt 73.9 kg (163 lb)   SpO2 98%   BMI 31.83 kg/m      Physical Exam  Eyes:      Pupils: Pupils are equal, round, and reactive to light.   Cardiovascular:      Rate and Rhythm: Normal rate and regular rhythm.   Pulmonary:      Effort: Pulmonary effort is normal.   Musculoskeletal:         General: Normal range of motion.      Cervical back: Normal range of motion.      Right lower leg: No edema.      Left lower leg: No edema.   Neurological:      General: No focal deficit present.      Mental Status: She is alert and oriented to person, place, and time.   Psychiatric:  Behavior: Behavior normal.      Comments: She does appear depressed at times              Jochebed Bills Eldridge Scot, MD            Dictated using voice recognition software. Proofread, but unrecognized voice recognition errors may exist.

## 2022-09-21 ENCOUNTER — Ambulatory Visit: Payer: Medicare Other | Admitting: Urology

## 2022-09-21 ENCOUNTER — Ambulatory Visit: Admit: 2022-09-21 | Payer: Medicare Other

## 2022-09-28 DEATH — deceased

## 2022-11-17 ENCOUNTER — Encounter: Admit: 2022-11-17 | Discharge: 2022-11-17 | Payer: PRIVATE HEALTH INSURANCE | Primary: Family Medicine

## 2022-11-17 DIAGNOSIS — Z23 Encounter for immunization: Secondary | ICD-10-CM

## 2022-11-22 ENCOUNTER — Ambulatory Visit: Payer: Medicare Other | Admitting: Oncology

## 2022-11-22 ENCOUNTER — Other Ambulatory Visit: Payer: Medicare Other

## 2023-01-07 NOTE — ED Provider Notes (Signed)
Formatting of this note is different from the original.  History  Chief Complaint   Patient presents with    Abdominal Pain     HPI  Is a very pleasant 77 year old female presenting for mid abdominal pain beginning today around 1:00.  Is been intermittent and minimal at present.  Additionally she is having some sense of shortness of breath.  She has a long history of being hypercoagulable initially on Coumadin now on Xarelto.  She has had multiple DVTs.  She is not known to have PE or mesenteric/portal thromboses.  There is been no fever cough runny nose sore throat nausea vomiting or diarrhea.  There is been no recent trauma travel or rash.  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, 2001    Dyslipidemia     Elevated coronary artery calcium score 2012    (119)    Osteoporosis      Past Surgical History:   Procedure Laterality Date    CHOLECYSTECTOMY      TUBAL LIGATION Bilateral      Family History   Problem Relation Age of Onset    Diabetes Mother     Hypertension Father     Coronary artery disease Father      Social History     Tobacco Use    Smoking status: Never    Smokeless tobacco: Never   Substance Use Topics    Alcohol use: No     Alcohol/week: 0.0 standard drinks of alcohol    Drug use: No     Review of Systems    Physical Exam  BP (!) 159/81 (BP Location: Right arm, Patient Position: Sitting)   Pulse 77   Temp 98 F (36.7 C) (Oral)   Resp 16   Ht 152.4 cm (60")   Wt 72.6 kg (160 lb)   SpO2 99%   BMI 31.25 kg/m     Physical Exam  Vitals and nursing note reviewed.   Constitutional:       General: She is not in acute distress.     Appearance: She is well-developed. She is not diaphoretic.   HENT:      Head: Normocephalic and atraumatic.   Eyes:      Conjunctiva/sclera: Conjunctivae normal.      Pupils: Pupils are equal, round, and reactive to light.   Cardiovascular:      Rate and Rhythm: Normal rate  and regular rhythm.      Heart sounds: Normal heart sounds. No murmur heard.     No friction rub.   Pulmonary:      Effort: Pulmonary effort is normal.      Breath sounds: Normal breath sounds. No wheezing or rales.   Chest:      Chest wall: No tenderness.   Abdominal:      General: There is no distension.      Tenderness: There is no abdominal tenderness.   Musculoskeletal:         General: No deformity.      Cervical back: Normal range of motion and neck supple.   Lymphadenopathy:      Cervical: No cervical adenopathy.   Skin:     General: Skin is warm and dry.      Capillary Refill: Capillary refill takes less than 2 seconds.      Findings:  No rash.   Neurological:      Mental Status: She is alert and oriented to person, place, and time.      GCS: GCS eye subscore is 4. GCS verbal subscore is 5. GCS motor subscore is 6.      Cranial Nerves: No cranial nerve deficit.      Coordination: Coordination normal.   Psychiatric:         Behavior: Behavior normal.         Thought Content: Thought content normal.         ED Course      Medical Decision Making  Is a very pleasant 77 year old female presenting with abdominal pain.  It is especially concerning given her history of being hypercoagulable.  She is on Xarelto and seems to be doing well on the drug.  Additionally she has some degree of shortness of breath as she has normal vital signs and a low Wells PE score of 1.5 placing her at low risk..    Labs Reviewed   CBC WITH DIFFERENTIAL   COMPREHENSIVE METABOLIC PANEL (CMP)   LIPASE   BNP (B NATRIURETIC PEPTIDE)           Norina Buzzard, DO  01/07/23 2315    Electronically signed by Norina Buzzard, DO at 01/07/2023 11:15 PM EST

## 2023-01-07 NOTE — ED Triage Notes (Signed)
Formatting of this note might be different from the original.  Mid abdominal stomach since this afternoon. No vomiting or diarrhea. Denies any new or unusual foods. Denies urinary/bowel symptoms. History of blood clotting disorder.  Electronically signed by Sonia Side, RN at 01/07/2023  9:01 PM EST

## 2023-01-07 NOTE — ED Provider Notes (Signed)
Formatting of this note is different from the original.  History  Chief Complaint   Patient presents with    Abdominal Pain     HPI  Is a very pleasant 77 year old female presenting for mid abdominal pain beginning today around 1:00.  Is been intermittent and minimal at present.  Additionally she is having some sense of shortness of breath.  She has a long history of being hypercoagulable initially on Coumadin now on Xarelto.  She has had multiple DVTs.  She is not known to have PE or mesenteric/portal thromboses.  There is been no fever cough runny nose sore throat nausea vomiting or diarrhea.  There is been no recent trauma travel or rash.  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, 2001    Dyslipidemia     Elevated coronary artery calcium score 2012    (119)    Osteoporosis      Past Surgical History:   Procedure Laterality Date    CHOLECYSTECTOMY      TUBAL LIGATION Bilateral      Family History   Problem Relation Age of Onset    Diabetes Mother     Hypertension Father     Coronary artery disease Father      Social History     Tobacco Use    Smoking status: Never    Smokeless tobacco: Never   Substance Use Topics    Alcohol use: No     Alcohol/week: 0.0 standard drinks of alcohol    Drug use: No     Review of Systems    Physical Exam  BP (!) 159/81 (BP Location: Right arm, Patient Position: Sitting)   Pulse 77   Temp 98 F (36.7 C) (Oral)   Resp 16   Ht 152.4 cm (60")   Wt 72.6 kg (160 lb)   SpO2 99%   BMI 31.25 kg/m     Physical Exam  Vitals and nursing note reviewed.   Constitutional:       General: She is not in acute distress.     Appearance: She is well-developed. She is not diaphoretic.   HENT:      Head: Normocephalic and atraumatic.   Eyes:      Conjunctiva/sclera: Conjunctivae normal.      Pupils: Pupils are equal, round, and reactive to light.   Cardiovascular:      Rate and Rhythm: Normal rate  and regular rhythm.      Heart sounds: Normal heart sounds. No murmur heard.     No friction rub.   Pulmonary:      Effort: Pulmonary effort is normal.      Breath sounds: Normal breath sounds. No wheezing or rales.   Chest:      Chest wall: No tenderness.   Abdominal:      General: There is no distension.      Tenderness: There is no abdominal tenderness.   Musculoskeletal:         General: No deformity.      Cervical back: Normal range of motion and neck supple.   Lymphadenopathy:      Cervical: No cervical adenopathy.   Skin:     General: Skin is warm and dry.      Capillary Refill: Capillary refill takes less than 2 seconds.      Findings:  No rash.   Neurological:      Mental Status: She is alert and oriented to person, place, and time.      GCS: GCS eye subscore is 4. GCS verbal subscore is 5. GCS motor subscore is 6.      Cranial Nerves: No cranial nerve deficit.      Coordination: Coordination normal.   Psychiatric:         Behavior: Behavior normal.         Thought Content: Thought content normal.         ED Course  Clinical Impressions as of [id number]   Periumbilical pain       Medical Decision Making  Is a very pleasant 77 year old female presenting with abdominal pain.  It is especially concerning given her history of being hypercoagulable.  She is on Xarelto and seems to be doing well on the drug.  Additionally she has some degree of shortness of breath as she has normal vital signs and a low Wells PE score of 1.5 placing her at low risk..    The end of the scheduled shift, the patient was signed out to the oncoming attending.      Labs Reviewed   CBC WITH DIFFERENTIAL   COMPREHENSIVE METABOLIC PANEL (CMP)   LIPASE   BNP (B NATRIURETIC PEPTIDE)           Norina Buzzard, DO  01/07/23 [place], [place], DO  01/10/23 [id number]    Electronically signed by Norina Buzzard, DO at 01/10/2023  9:25 AM EST

## 2023-01-07 NOTE — ED Notes (Signed)
Formatting of this note might be different from the original.  Pt to [facility] for c/o mid abd pain.  Denies fever, chills, N/V/D, dysuria, change in medications/diet.  Pt states she has been eating & drinking normally today.    Electronically signed by Lonzo Cloud, RN at 01/07/2023  9:55 PM EST

## 2023-01-08 NOTE — Procedures (Signed)
Associated Order(s): ECG 12 Lead (Once)  Formatting of this note might be different from the original.  ECG 12 Lead (Once)    Date/Time: 01/10/2023 9:25 AM    Performed by: Norina Buzzard, DO  Authorized by: Norina Buzzard, DO    Comments:      This EKG was obtained for abdominal pain shortness of breath  Normal sinus rhythm 72 bpm  Normal axis  No hypertrophy  No ST segment or T wave abnormality suggestive of injury ischemia or infarct with exception of inverted T waves in V3  Normal QT interval    Electronically signed by Norina Buzzard, DO at 01/10/2023  9:26 AM EST

## 2023-01-08 NOTE — ED Notes (Signed)
Formatting of this note might be different from the original.  77 yo female presenting with abdominal pain. She had lab work done that was reassuring. She was signed out pending CT scan. This is reassuring. She has had minimal pain since she has been here. Family at bedside felt like she was short of breath but patient states she does not feel short of breath. Encouraged them to follow up with pcp closely and return for any new, worsening or concerning symptoms.   Electronically signed by Andreas Blower, MD at 01/08/2023 12:29 AM EST

## 2023-01-09 NOTE — Telephone Encounter (Signed)
-----   Message from [place] sent at 01/09/2023 12:16 PM EST -----  Subject: Appointment Request    Reason for Call: Established Patient Appointment needed: Routine ED Follow   Up Visit    QUESTIONS    Reason for appointment request? No appointments available during search     Additional Information for Provider? Pt was seen in ED 2 nights ago. Blood   work, urine, ct scan all ok. Stomach cramps and anxiety. Needs appt this   week. Please call daughter, Mickel Baas, to schedule.   ---------------------------------------------------------------------------  --------------  [facility] INFO  [phone]; OK to leave message on voicemail  ---------------------------------------------------------------------------  --------------  SCRIPT ANSWERS

## 2023-01-09 NOTE — Telephone Encounter (Signed)
Please advise - could this be with Tabitha?

## 2023-01-10 NOTE — Telephone Encounter (Signed)
Per Dr. Oletta Darter ok to schedule with Lawerance Bach

## 2023-01-11 ENCOUNTER — Ambulatory Visit: Admit: 2023-01-11 | Discharge: 2023-01-11 | Payer: MEDICARE | Attending: Family | Primary: Family Medicine

## 2023-01-11 DIAGNOSIS — R1084 Generalized abdominal pain: Secondary | ICD-10-CM

## 2023-01-11 MED ORDER — ESCITALOPRAM OXALATE 5 MG PO TABS
5 MG | ORAL_TABLET | Freq: Every day | ORAL | 1 refills | Status: AC
Start: 2023-01-11 — End: 2023-03-21

## 2023-01-11 NOTE — Assessment & Plan Note (Signed)
Anxiety/Depression  New Onset.  Labs: at least annually. No labs collected today.  Non-laboratory testing: None indicated today.  Medication:  Given Rx for Escitalopram 5 mg  Discussed warning S&S r/t medication usage. Discussed SE, AR, AE.  Encourage appropriate rest and fluid intake  Mental Health: Promote sleep hygiene (Establish a regular sleep schedule; Cut down time in bed; Make the bedroom comfortable; Relax at bedtime; Perform measures to make you tired at bedtime). Do not exercise within 90 minutes of bedtime. No overstimulating activities just before bed. Avoid caffeine after lunchtime. Do not eat a heavy meal within 2 hours of bedimte. Avoid excessive fluids immediately before bedtime. Do not use alcohol to induce sleep. Do not turn on light when getting up to use the bathroom.  Encourage lifestyle changes, including healthy eating, exercise, limiting alcohol/tobacco use, and stress reducers.  Encourage lifestyle changes, including healthy eating, exercise, limiting alcohol/tobacco use, and stress reducers.  Monitor for SI or HI. Seek emergent care if symptoms develop.  Discussed alternative therapies such as keeping a diary, CBT, psychotherapy, etc.  F/u in  2 months  for re-evaluation, unless an earlier f/u is required. If improvement is noted, we will provide additional refills.

## 2023-01-11 NOTE — Addendum Note (Signed)
Addended by: Christean Leaf on: 01/11/2023 01:01 PM     Modules accepted: Level of Service

## 2023-01-11 NOTE — Progress Notes (Signed)
[facility]  [place]  [place] [address]  Dept: [phone]       Patient: Tonya Shaffer  Date of Birth: 04-29-1946  Patient Age: 77 y.o.  Patient Sex: female  MEDICAL RECORD [id number]  Visit Date: 01/11/2023    Family Practice Clinic Note    Chief Complaint   Patient presents with    Abdominal Pain     Went to ER for abdominal pain. Pain has resolved        1. The patient and her daughter present today for a follow-up from the ER on 01/07/2023 related to abdominal pain.  She reports that she has no pain or discomfort today.  Denies any symptoms today.  She also reports that she may have eaten something that did not agree with her stomach earlier in the day and that caused her to go to the ER.  Otherwise she reports that her testing was unremarkable.  2.  Her daughter and her report that she also has episodes of crying and mood changes.  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  Her family does help as well.  Has been going on for months and she is now interested in trying medication to help with this anxiety and depression.    Abdominal Pain  This is a new problem. The problem has been resolved.   Anxiety  Presents for initial visit. Onset was 6 to 12 months ago. The problem has been gradually worsening. Symptoms include depressed mood, excessive worry and nervous/anxious behavior. Patient reports no chest pain, compulsions, confusion, feeling of choking, irritability or palpitations. Symptoms occur most days. The severity of symptoms is moderate. The symptoms are aggravated by family issues. The quality of sleep is fair.     Past treatments include nothing. The treatment provided mild relief. Compliance with prior treatments has been good.        Allergies   Allergen Reactions    Codeine Other (See Comments)       Current Outpatient Medications   Medication Sig Dispense Refill    escitalopram (LEXAPRO) 5 MG  tablet Take 1 tablet by mouth daily 90 tablet 1    albuterol sulfate HFA (PROVENTIL;VENTOLIN;PROAIR) 108 (90 Base) MCG/ACT inhaler Inhale 2 puffs into the lungs every 4 hours as needed for Shortness of Breath 18 g 5    rivaroxaban (XARELTO) 20 MG TABS tablet Take 1 tablet by mouth daily (with breakfast) TAKE 1 TABLET BY MOUTH DAILY WITH DINNER 90 tablet 1    atorvastatin (LIPITOR) 40 MG tablet Take 1 tablet by mouth every evening 90 tablet 1    alendronate (FOSAMAX) 70 MG tablet Take 1 tablet by mouth every 7 days 4 tablet 5    montelukast (SINGULAIR) 10 MG tablet TAKE 1 TABLET BY MOUTH EVERY NIGHT 90 tablet 1    budesonide (PULMICORT) 90 MCG/ACT AEPB inhaler Inhale 2 puffs into the lungs 2 times daily      calcium carb-cholecalciferol 250-125 MG-UNIT TABS Take 1 tablet by mouth daily       No current facility-administered medications for this visit.        Patient Active Problem List   Diagnosis    Mixed hyperlipidemia    Mild intermittent asthma without complication    Protein C deficiency (HCC)    Impaired fasting glucose    Age-related osteoporosis without current pathological fracture    Osteopetrosis  History of DVT (deep vein thrombosis)    Nonspecific abnormal electrocardiogram (ECG) (EKG)    Chronic renal disease, stage III ([place]) [id number]    Major depressive disorder, recurrent, moderate       Family History   Problem Relation Age of Onset    Diabetes Mother     Cancer Father     Asthma Father     Heart Disease Father     Diabetes Sister     Cancer Brother     Breast Cancer Neg Hx         Past Surgical History:   Procedure Laterality Date    CHOLECYSTECTOMY      GYN      Removed tubes    PR UNLISTED PROCEDURE ABDOMEN PERITONEUM & OMENTUM      VASCULAR SURGERY      Left leg striped the vein        Review of Systems   Constitutional:  Negative for irritability.   Cardiovascular:  Negative for chest pain and palpitations.   Gastrointestinal:  Positive for abdominal pain.   Psychiatric/Behavioral:  Negative  for confusion. The patient is nervous/anxious.         Vitals:    01/11/23 0951   BP: 133/79   Pulse: 82   Temp: 98.9 F (37.2 C)   SpO2: 98%        Physical Exam  Vitals and nursing note reviewed.   HENT:      Mouth/Throat:      Mouth: Mucous membranes are moist.   Eyes:      Pupils: Pupils are equal, round, and reactive to light.   Cardiovascular:      Rate and Rhythm: Normal rate and regular rhythm.      Pulses: Normal pulses.      Heart sounds: Normal heart sounds.   Pulmonary:      Effort: Pulmonary effort is normal.      Breath sounds: Normal breath sounds.   Abdominal:      General: Bowel sounds are normal.      Palpations: Abdomen is soft.      Tenderness: There is no abdominal tenderness. There is no right CVA tenderness, left CVA tenderness, guarding or rebound.   Skin:     General: Skin is warm.      Capillary Refill: Capillary refill takes less than 2 seconds.   Neurological:      General: No focal deficit present.      Mental Status: She is alert and oriented to person, place, and time.   Psychiatric:         Mood and Affect: Mood normal. Affect is tearful.         Behavior: Behavior normal.         Thought Content: Thought content normal.         Judgment: Judgment normal.          1. Generalized abdominal pain  Comments:  Resolved  2. Major depressive disorder, recurrent, moderate  Assessment & Plan:   Anxiety/Depression  New Onset.  Labs: at least annually. No labs collected today.  Non-laboratory testing: None indicated today.  Medication:  Given Rx for Escitalopram 5 mg  Discussed warning S&S r/t medication usage. Discussed SE, AR, AE.  Encourage appropriate rest and fluid intake  Mental Health: Promote sleep hygiene (Establish a regular sleep schedule; Cut down time in bed; Make the bedroom comfortable; Relax at bedtime; Perform measures to make you tired at bedtime).  Do not exercise within 90 minutes of bedtime. No overstimulating activities just before bed. Avoid caffeine after lunchtime. Do  not eat a heavy meal within 2 hours of bedimte. Avoid excessive fluids immediately before bedtime. Do not use alcohol to induce sleep. Do not turn on light when getting up to use the bathroom.  Encourage lifestyle changes, including healthy eating, exercise, limiting alcohol/tobacco use, and stress reducers.  Encourage lifestyle changes, including healthy eating, exercise, limiting alcohol/tobacco use, and stress reducers.  Monitor for SI or HI. Seek emergent care if symptoms develop.  Discussed alternative therapies such as keeping a diary, CBT, psychotherapy, etc.  F/u in  2 months  for re-evaluation, unless an earlier f/u is required. If improvement is noted, we will provide additional refills.         I have reviewed the patient's past medical history, social history and family history and vitals.  We have discussed treatment plan and follow up and given patient instructions.  Patient's questions are answered and we will follow up as indicated.    Return in about 2 months (around 03/12/2023).     Kandice Hams, APRN - NP    ADDITIONAL EDUCATION    Last 7 days of labs:    No visits with results within 1 Week(s) from this visit.   Latest known visit with results is:   Orders Only on 09/14/2022   Component Date Value Ref Range Status    Hemoglobin A1C 09/14/2022 5.4  4.8 - 5.6 % Final    Estimated Avg Glucose 09/14/2022 108  mg/dL Final    Comment: Reference Range  Normal: 4.8-5.6  Diabetic >=6.5%  Normal       <5.7%      TSH, 3RD GENERATION 09/14/2022 2.350  0.358 - 3.740 uIU/mL Final    Cholesterol, Total 09/14/2022 141  <200 MG/DL Final    Comment: Borderline High: 200-239 mg/dL  High: Greater than or equal to 240 mg/dL      Triglycerides 09/14/2022 79  35 - 150 MG/DL Final    Comment: Borderline High: 150-199 mg/dL, High: 200-499 mg/dL  Very High: Greater than or equal to 500 mg/dL      HDL 09/14/2022 66 (H)  40 - 60 MG/DL Final    LDL Calculated 09/14/2022 59.2  <100 MG/DL Final    Comment: Near Optimal: 100-129  mg/dL  Borderline High: 130-159, High: 160-189 mg/dL  Very High: Greater than or equal to 190 mg/dL      VLDL Cholesterol Calculated 09/14/2022 15.8  6.0 - 23.0 MG/DL Final    Chol/HDL Ratio 09/14/2022 2.1    Final    Sodium 09/14/2022 141  133 - 143 mmol/L Final    Potassium 09/14/2022 4.9  3.5 - 5.1 mmol/L Final    Chloride 09/14/2022 108  101 - 110 mmol/L Final    CO2 09/14/2022 28  21 - 32 mmol/L Final    Anion Gap 09/14/2022 5  2 - 11 mmol/L Final    Glucose 09/14/2022 105 (H)  65 - 100 mg/dL Final    BUN 09/14/2022 13  8 - 23 MG/DL Final    Creatinine 09/14/2022 1.00  0.6 - 1.0 MG/DL Final    Est, Glom Filt Rate 09/14/2022 58 (L)  >60 ml/min/1.10m Final    Comment:   Pediatric calculator link: https://www.kidney.org/professionals/kdoqi/gfr_calculatorped    These results are not intended for use in patients <170years of age.    eGFR results are calculated without a race factor using  the 2021 CKD-EPI equation. Careful clinical correlation is recommended, particularly when comparing to results calculated using previous equations.  The CKD-EPI equation is less accurate in patients with extremes of muscle mass, extra-renal metabolism of creatinine, excessive creatine ingestion, or following therapy that affects renal tubular secretion.      Calcium 09/14/2022 9.6  8.3 - 10.4 MG/DL Final    Total Bilirubin 09/14/2022 0.6  0.2 - 1.1 MG/DL Final    ALT 09/14/2022 20  12 - 65 U/L Final    AST 09/14/2022 14 (L)  15 - 37 U/L Final    Alk Phosphatase 09/14/2022 46 (L)  50 - 136 U/L Final    Total Protein 09/14/2022 7.8  6.3 - 8.2 g/dL Final    Albumin 09/14/2022 3.8  3.2 - 4.6 g/dL Final    Globulin 09/14/2022 4.0  2.8 - 4.5 g/dL Final    Albumin/Globulin Ratio 09/14/2022 1.0  0.4 - 1.6   Final    WBC 09/14/2022 3.7 (L)  4.3 - 11.1 K/uL Final    RBC 09/14/2022 4.37  4.05 - 5.2 M/uL Final    Hemoglobin 09/14/2022 13.8  11.7 - 15.4 g/dL Final    Hematocrit 09/14/2022 42.3  35.8 - 46.3 % Final    MCV 09/14/2022 96.8  82 -  102 FL Final    MCH 09/14/2022 31.6  26.1 - 32.9 PG Final    MCHC 09/14/2022 32.6  31.4 - 35.0 g/dL Final    RDW 09/14/2022 13.0  11.9 - 14.6 % Final    Platelets 09/14/2022 308  150 - 450 K/uL Final    MPV 09/14/2022 10.9  9.4 - 12.3 FL Final    nRBC 09/14/2022 0.00  0.0 - 0.2 K/uL Final    **Note: Absolute NRBC parameter is now reported with Hemogram**    Differential Type 09/14/2022 AUTOMATED    Final    Neutrophils % 09/14/2022 34 (L)  43 - 78 % Final    Lymphocytes % 09/14/2022 44  13 - 44 % Final    Monocytes % 09/14/2022 17 (H)  4.0 - 12.0 % Final    Eosinophils % 09/14/2022 3  0.5 - 7.8 % Final    Basophils % 09/14/2022 2  0.0 - 2.0 % Final    Immature Granulocytes 09/14/2022 0  0.0 - 5.0 % Final    Neutrophils Absolute 09/14/2022 1.2 (L)  1.7 - 8.2 K/UL Final    Lymphocytes Absolute 09/14/2022 1.6  0.5 - 4.6 K/UL Final    Monocytes Absolute 09/14/2022 0.6  0.1 - 1.3 K/UL Final    Eosinophils Absolute 09/14/2022 0.1  0.0 - 0.8 K/UL Final    Basophils Absolute 09/14/2022 0.1  0.0 - 0.2 K/UL Final    Absolute Immature Granulocyte 09/14/2022 0.0  0.0 - 0.5 K/UL Final       Educational documents were provided including; but, not limited to diagnosis, prognosis, recurrent, complications, monitoring, instructions, prevention, etc.    Patient aware of all medications (prescribed and recommended). Patient verbalized understanding. Patient declined all other medications (OTC, provided by clinic and prescribed) as well as additional testing/imaging/diagnostics at this time. Patient aware of risks associated with declining treatment/recommendations and/or non-compliance with plan of care.    *Side effects, adverse effects, risks versus benefits associated with medications prescribed/recommended were discussed with the patient. Patient verbalized understanding. All questions answered.    *Patient was encouraged to return to the clinic and/or PCP. Or seek emergent care if worsening signs and symptoms warrant immediate  evaluation including, but not limited to HA, blurred vision, facial asymmetry, speech disturbance, difficulty with ambulation/gait, numbness, tingling, weakness, syncope, chest pain (with or without radiation), left arm pain, jaw pain, changes in hearing (loss), fever, unexplained sweating, malaise/fatigue, difficulty swallowing, mental changes (confusion, AMS), lightheadedness/dizziness, difficulty breathing, or shortness of breath.    I have reviewed the patient's medication list, past medical, family, social, and surgical history in detail and updated the patient record appropriately.    I have reviewed the patient's vital signs and discussed risks associated with any abnormal vital signs (during visit and following this visit) as well as appropriate parameters with the patient. Patient verbalized understanding. Patient agreed to seek emergent care if vital signs are above or below the parameters discussed.     Explanatory note: Be assured that the information provided to create your medical record comes from your provider. The written transcription portion of this note is prepared electronically by voice-recognition software. At times, there may be some errors in capitalization, punctuation, tense, or context that are inherent in the system, but your provider reviews the note for content and to ensure that the note contains appropriate information for your continuing care.

## 2023-03-13 MED ORDER — MONTELUKAST SODIUM 10 MG PO TABS
10 MG | ORAL_TABLET | ORAL | 1 refills | Status: AC
Start: 2023-03-13 — End: 2023-03-21

## 2023-03-14 ENCOUNTER — Encounter

## 2023-03-14 ENCOUNTER — Encounter: Admit: 2023-03-14 | Discharge: 2023-03-14 | Payer: MEDICARE | Primary: Family Medicine

## 2023-03-14 DIAGNOSIS — E039 Hypothyroidism, unspecified: Secondary | ICD-10-CM

## 2023-03-14 LAB — COMPREHENSIVE METABOLIC PANEL
ALT: 19 U/L (ref 12–65)
AST: 14 U/L — ABNORMAL LOW (ref 15–37)
Albumin/Globulin Ratio: 1.1 (ref 0.4–1.6)
Albumin: 3.7 g/dL (ref 3.2–4.6)
Alk Phosphatase: 43 U/L — ABNORMAL LOW (ref 50–136)
Anion Gap: 4 mmol/L (ref 2–11)
BUN: 9 MG/DL (ref 8–23)
CO2: 29 mmol/L (ref 21–32)
Calcium: 9.6 MG/DL (ref 8.3–10.4)
Chloride: 105 mmol/L (ref 103–113)
Creatinine: 0.9 MG/DL (ref 0.6–1.0)
Est, Glom Filt Rate: 66 mL/min/{1.73_m2} (ref >60)
Globulin: 3.3 g/dL (ref 2.8–4.5)
Glucose: 106 mg/dL — ABNORMAL HIGH (ref 65–100)
Potassium: 4.3 mmol/L (ref 3.5–5.1)
Sodium: 138 mmol/L (ref 136–146)
Total Bilirubin: 0.6 MG/DL (ref 0.2–1.1)
Total Protein: 7 g/dL (ref 6.3–8.2)

## 2023-03-14 LAB — LIPID PANEL
Chol/HDL Ratio: 1.9
Cholesterol, Total: 132 MG/DL (ref <200)
HDL: 70 MG/DL — ABNORMAL HIGH (ref 40–60)
LDL Calculated: 47.2 MG/DL (ref <100)
Triglycerides: 74 MG/DL (ref 35–150)
VLDL Cholesterol Calculated: 14.8 MG/DL (ref 6.0–23.0)

## 2023-03-14 LAB — CBC WITH AUTO DIFFERENTIAL
Basophils %: 2 % (ref 0.0–2.0)
Basophils Absolute: 0.1 10*3/uL (ref 0.0–0.2)
Eosinophils %: 3 % (ref 0.5–7.8)
Eosinophils Absolute: 0.1 10*3/uL (ref 0.0–0.8)
Hematocrit: 41 % (ref 35.8–46.3)
Hemoglobin: 13.6 g/dL (ref 11.7–15.4)
Immature Granulocytes %: 0 % (ref 0.0–5.0)
Immature Granulocytes Absolute: 0 10*3/uL (ref 0.0–0.5)
Lymphocytes %: 45 % — ABNORMAL HIGH (ref 13–44)
Lymphocytes Absolute: 1.6 10*3/uL (ref 0.5–4.6)
MCH: 32.4 PG (ref 26.1–32.9)
MCHC: 33.2 g/dL (ref 31.4–35.0)
MCV: 97.6 FL (ref 82–102)
MPV: 10.8 FL (ref 9.4–12.3)
Monocytes %: 15 % — ABNORMAL HIGH (ref 4.0–12.0)
Monocytes Absolute: 0.5 10*3/uL (ref 0.1–1.3)
Neutrophils %: 35 % — ABNORMAL LOW (ref 43–78)
Neutrophils Absolute: 1.2 10*3/uL — ABNORMAL LOW (ref 1.7–8.2)
Platelets: 318 10*3/uL (ref 150–450)
RBC: 4.2 M/uL (ref 4.05–5.2)
RDW: 13 % (ref 11.9–14.6)
WBC: 3.5 10*3/uL — ABNORMAL LOW (ref 4.3–11.1)
nRBC: 0 10*3/uL (ref 0.0–0.2)

## 2023-03-14 LAB — TSH: TSH, 3RD GENERATION: 3.35 u[IU]/mL (ref 0.358–3.740)

## 2023-03-15 LAB — HEMOGLOBIN A1C
Estimated Avg Glucose: 108 mg/dL
Hemoglobin A1C: 5.4 % (ref 4.8–5.6)

## 2023-03-21 ENCOUNTER — Ambulatory Visit: Admit: 2023-03-21 | Discharge: 2023-03-21 | Payer: MEDICARE | Attending: Family Medicine | Primary: Family Medicine

## 2023-03-21 DIAGNOSIS — Z0000 Encounter for general adult medical examination without abnormal findings: Secondary | ICD-10-CM

## 2023-03-21 MED ORDER — ESCITALOPRAM OXALATE 10 MG PO TABS
10 | ORAL_TABLET | Freq: Every day | ORAL | 1 refills | Status: DC
Start: 2023-03-21 — End: 2023-04-18

## 2023-03-21 MED ORDER — ALBUTEROL SULFATE HFA 108 (90 BASE) MCG/ACT IN AERS
108 (90 Base) MCG/ACT | RESPIRATORY_TRACT | 5 refills | Status: DC | PRN
Start: 2023-03-21 — End: 2023-09-27

## 2023-03-21 MED ORDER — RIVAROXABAN 20 MG PO TABS
20 MG | ORAL_TABLET | Freq: Every day | ORAL | 1 refills | Status: DC
Start: 2023-03-21 — End: 2023-09-27

## 2023-03-21 MED ORDER — ATORVASTATIN CALCIUM 40 MG PO TABS
40 MG | ORAL_TABLET | Freq: Every evening | ORAL | 1 refills | Status: DC
Start: 2023-03-21 — End: 2023-05-01

## 2023-03-21 MED ORDER — MONTELUKAST SODIUM 10 MG PO TABS
10 MG | ORAL_TABLET | Freq: Every evening | ORAL | 1 refills | Status: DC
Start: 2023-03-21 — End: 2023-09-27

## 2023-03-21 MED ORDER — ALENDRONATE SODIUM 70 MG PO TABS
70 MG | ORAL_TABLET | ORAL | 5 refills | Status: DC
Start: 2023-03-21 — End: 2023-09-27

## 2023-03-21 NOTE — Progress Notes (Signed)
Medicare Annual Wellness Visit    Tonya Shaffer is here for Medicare AWV (Patient is here for a Medicare nWellness), Depression (Patient would like to discuss increasing her Lexapro. She has not noticed a difference.), and Discuss Labs    Assessment & Plan   Medicare annual wellness visit, subsequent  Major depressive disorder, recurrent, moderate  -     escitalopram (LEXAPRO) 10 MG tablet; Take 1 tablet by mouth daily, Disp-90 tablet, R-1Normal  Stage 3a chronic kidney disease (HCC)  Assessment & Plan:   Well-controlled, continue current medications and lifestyle modifications recommended  Impaired fasting glucose  Protein C deficiency (HCC)  Assessment & Plan:   Asymptomatic, continue current treatment plan and lifestyle modifications recommended  Orders:  -     rivaroxaban (XARELTO) 20 MG TABS tablet; Take 1 tablet by mouth daily (with breakfast) TAKE 1 TABLET BY MOUTH DAILY WITH DINNER, Disp-90 tablet, R-1Normal  Mild intermittent asthma without complication  Assessment & Plan:   Well-controlled, continue current medications, medication adherence emphasized, and lifestyle modifications recommended  Orders:  -     albuterol sulfate HFA (PROVENTIL;VENTOLIN;PROAIR) 108 (90 Base) MCG/ACT inhaler; Inhale 2 puffs into the lungs every 4 hours as needed for Shortness of Breath, Disp-18 g, R-5Normal  -     montelukast (SINGULAIR) 10 MG tablet; Take 1 tablet by mouth nightly, Disp-90 tablet, R-1Normal  Mixed hyperlipidemia  -     atorvastatin (LIPITOR) 40 MG tablet; Take 1 tablet by mouth every evening, Disp-90 tablet, R-1Normal  Age-related osteoporosis without current pathological fracture  -     alendronate (FOSAMAX) 70 MG tablet; Take 1 tablet by mouth every 7 days, Disp-4 tablet, R-5Normal    Will increase Lexapro to 10 mg daily.  Will follow-up in 1 month if no improvement is noted and possibly increased to 20 mg.  We reviewed her labs which are all very stable.  Blood pressure is well-controlled.  Recommendations for  Preventive Services Due: see orders and patient instructions/AVS.  Recommended screening schedule for the next 5-10 years is provided to the patient in written form: see Patient Instructions/AVS.     Return in about 6 months (around 09/20/2023), or if symptoms worsen or fail to improve, for labs 1 week prior to visit.     Subjective   The following acute and/or chronic problems were also addressed today:  Here for AWV.  Also here to review her labs.  Accompanied by her granddaughter who lives with her.  Patient complains of some depression.  Her husband unfortunately has Parkinson disease and is having difficulty with dementia and has been progressively declining.  She feels that her life has altered and admits to feelings of depression.  She was started on Lexapro 5 mg daily last month.  She has not noticed any meaningful difference.  No adverse reactions to the medication.  She does remain very active.  She has no concerns with her memory.  She denies any suicidal homicidal thoughts.  No chest pain or shortness of breath.  No other physical complaints.    Patient's complete Health Risk Assessment and screening values have been reviewed and are found in Flowsheets. The following problems were reviewed today and where indicated follow up appointments were made and/or referrals ordered.    Positive Risk Factor Screenings with Interventions:        Depression:  PHQ-2 Score: 4  PHQ-9 Total Score: 10    Interpretation:  5-9 mild   10-14 moderate   15-19 moderately  severe   20-27 severe     Interventions:  Life style changes to improve mood reviewed  Medications adjusted: Increase Lexapro to 10 mg.          General HRA Questions:  Select all that apply: (!) Loneliness    Loneliness Interventions:  Patient declined any further interventions or treatment      Activity, Diet, and Weight:  On average, how many days per week do you engage in moderate to strenuous exercise (like a brisk walk)?: 2 days  On average, how many  minutes do you engage in exercise at this level?: 20 min    Do you eat balanced/healthy meals regularly?: (!) No    Body mass index is 32.03 kg/m. (!) Abnormal    Do you eat balanced/healthy meals regularly Interventions:  Patient declines any further evaluation or treatment  Obesity Interventions:  Patient declines any further evaluation or treatment             Hearing Screen:  Do you or your family notice any trouble with your hearing that hasn't been managed with hearing aids?: (!) Yes    Interventions:  Patient declines any further evaluation or treatment      ADL's:   Patient reports needing help with:  Select all that apply: [place], Housekeeping, Shopping, Presenter, broadcasting, Transportation  Interventions:  Patient declined any further interventions or treatment                  Objective   Vitals:    03/21/23 1034   BP: 119/65   Site: Left Upper Arm   Position: Sitting   Cuff Size: Large Adult   Pulse: 78   Temp: 97.8 F (36.6 C)   TempSrc: Temporal   SpO2: 99%   Weight: 74.4 kg (164 lb)   Height: 1.524 m (5')      Body mass index is 32.03 kg/m.         Physical Exam  Vitals and nursing note reviewed.   HENT:      Mouth/Throat:      Mouth: Mucous membranes are moist.   Eyes:      Pupils: Pupils are equal, round, and reactive to light.   Cardiovascular:      Rate and Rhythm: Normal rate and regular rhythm.      Pulses: Normal pulses.      Heart sounds: Normal heart sounds.   Pulmonary:      Effort: Pulmonary effort is normal.      Breath sounds: Normal breath sounds.   Abdominal:      General: Bowel sounds are normal.      Palpations: Abdomen is soft.      Tenderness: There is no abdominal tenderness. There is no right CVA tenderness, left CVA tenderness, guarding or rebound.   Skin:     General: Skin is warm.      Capillary Refill: Capillary refill takes less than 2 seconds.   Neurological:      General: No focal deficit present.      Mental Status: She is alert and oriented to person, place, and time.    Psychiatric:         Mood and Affect: Mood normal. Affect is tearful.         Behavior: Behavior normal.         Thought Content: Thought content normal.         Judgment: Judgment normal.  Allergies   Allergen Reactions    Codeine Other (See Comments)     Prior to Visit Medications    Medication Sig Taking? Authorizing Provider   albuterol sulfate HFA (PROVENTIL;VENTOLIN;PROAIR) 108 (90 Base) MCG/ACT inhaler Inhale 2 puffs into the lungs every 4 hours as needed for Shortness of Breath Yes Laquashia Mergenthaler, Fransico Meadow, MD   alendronate (FOSAMAX) 70 MG tablet Take 1 tablet by mouth every 7 days Yes Milliani Herrada, Fransico Meadow, MD   atorvastatin (LIPITOR) 40 MG tablet Take 1 tablet by mouth every evening Yes Tayloranne Lekas, Fransico Meadow, MD   escitalopram (LEXAPRO) 10 MG tablet Take 1 tablet by mouth daily Yes Georgia Delsignore, Fransico Meadow, MD   montelukast (SINGULAIR) 10 MG tablet Take 1 tablet by mouth nightly Yes Jillianne Gamino, Fransico Meadow, MD   rivaroxaban (XARELTO) 20 MG TABS tablet Take 1 tablet by mouth daily (with breakfast) TAKE 1 TABLET BY MOUTH DAILY WITH DINNER Yes Alexxa Sabet, Fransico Meadow, MD   calcium carb-cholecalciferol 250-125 MG-UNIT TABS Take 1 tablet by mouth daily Yes Automatic Reconciliation, Ar   budesonide (PULMICORT) 90 MCG/ACT AEPB inhaler Inhale 2 puffs into the lungs 2 times daily  Patient not taking: Reported on 03/21/2023  Automatic Reconciliation, Ar       CareTeam (Including outside providers/suppliers regularly involved in providing care):   Patient Care Team:  Miles Costain, MD as PCP - General  Finlay Godbee, Fransico Meadow, MD as PCP - Empaneled Provider     Reviewed and updated this visit:  Tobacco  Allergies  Meds  Problems  Med Hx  Surg Hx  Soc Hx  Fam Hx

## 2023-03-21 NOTE — Patient Instructions (Signed)
Learning About Being Active as an Older Adult  Why is being active important as you get older?     Being active is one of the best things you can do for your health. And it's never too late to start. Being active--or getting active, if you aren't already--has definite benefits. It can:  Give you more energy,  Keep your mind sharp.  Improve balance to reduce your risk of falls.  Help you manage chronic illness with fewer medicines.  No matter how old you are, how fit you are, or what health problems you have, there is a form of activity that will work for you. And the more physical activity you can do, the better your overall health will be.  What kinds of activity can help you stay healthy?  Being more active will make your daily activities easier. Physical activity includes planned exercise and things you do in daily life. There are four types of activity:  Aerobic.  Doing aerobic activity makes your heart and lungs strong.  Includes walking, dancing, and gardening.  Aim for at least 2 hours spread throughout the week.  It improves your energy and can help you sleep better.  Muscle-strengthening.  This type of activity can help maintain muscle and strengthen bones.  Includes climbing stairs, using resistance bands, and lifting or carrying heavy loads.  Aim for at least twice a week.  It can help protect the knees and other joints.  Stretching.  Stretching gives you better range of motion in joints and muscles.  Includes upper arm stretches, calf stretches, and gentle yoga.  Aim for at least twice a week, preferably after your muscles are warmed up from other activities.  It can help you function better in daily life.  Balancing.  This helps you stay coordinated and have good posture.  Includes heel-to-toe walking, tai chi, and certain types of yoga.  Aim for at least 3 days a week.  It can reduce your risk of falling.  Even if you have a hard time meeting the recommendations, it's better to be more active  than less active. All activity done in each category counts toward your weekly total. You'd be surprised how daily things like carrying groceries, keeping up with grandchildren, and taking the stairs can add up.  What keeps you from being active?  If you've had a hard time being more active, you're not alone. Maybe you remember being able to do more. Or maybe you've never thought of yourself as being active. It's frustrating when you can't do the things you want. Being more active can help. What's holding you back?  Getting started.  Have a goal, but break it into easy tasks. Small steps build into big accomplishments.  Staying motivated.  If you feel like skipping your activity, remember your goal. Maybe you want to move better and stay independent. Every activity gets you one step closer.  Not feeling your best.  Start with 5 minutes of an activity you enjoy. Prove to yourself you can do it. As you get comfortable, increase your time.  You may not be where you want to be. But you're in the process of getting there. Everyone starts somewhere.  How can you find safe ways to stay active?  Talk with your doctor about any physical challenges you're facing. Make a plan with your doctor if you have a health problem or aren't sure how to get started with activity.  If you're already active, ask your doctor if  there is anything you should change to stay safe as your body and health change.  If you tend to feel dizzy after you take medicine, avoid activity at that time. Try being active before you take your medicine. This will reduce your risk of falls.  If you plan to be active at home, make sure to clear your space before you get started. Remove things like TV cords, coffee tables, and throw rugs. It's safest to have plenty of space to move freely.  The key to getting more active is to take it slow and steady. Try to improve only a little bit at a time. Pick just one area to improve on at first. And if an activity hurts,  stop and talk to your doctor.  Where can you learn more?  Go to https://www.bennett.info/ and enter P600 to learn more about "Learning About Being Active as an Older Adult."  Current as of: June 5, 2023Content Version: 14.0   2006-2024 [employer].   Care instructions adapted under license by [facility]. If you have questions about a medical condition or this instruction, always ask your healthcare professional. [place] any warranty or liability for your use of this information.           Starting a Weight Loss Plan: Care Instructions  Overview     It can be a challenge to lose weight. But your doctor can help you make a weight-loss plan that meets your needs.  You don't have to make a lot of big changes at once. A better idea might be to focus on small changes and stick with them. When those changes become habit, you can add a few more changes.  Some people find it helpful to take an exercise or nutrition class. If you have questions, ask your doctor about seeing a registered dietitian or an exercise specialist. You might also think about joining a weight-loss support group.  If you're not ready to make changes right now, try to pick a date in the future. Then make an appointment with your doctor to talk about when and how you'll get started with a plan.  Follow-up care is a key part of your treatment and safety. Be sure to make and go to all appointments, and call your doctor if you are having problems. It's also a good idea to know your test results and keep a list of the medicines you take.  How can you care for yourself at home?  Set realistic goals. Many people expect to lose much more weight than is likely. A weight loss of 5% to 10% of your body weight may be enough to improve your health.  Get family and friends involved to provide support. Talk to them about why you are trying to lose weight, and ask them to help. They can help by  participating in exercise and having meals with you, even if they may be eating something different.  Find what works best for you. If you do not have time or do not like to cook, a program that offers meal replacement bars or shakes may be better for you. Or if you like to prepare meals, finding a plan that includes daily menus and recipes may be best.  Ask your doctor about other health professionals who can help you achieve your weight loss goals.  A dietitian can help you make healthy changes in your diet.  An exercise specialist or personal trainer can help you develop a safe and  effective exercise program.  A counselor or psychiatrist can help you cope with issues such as depression, anxiety, or family problems that can make it hard to focus on weight loss.  Consider joining a support group for people who are trying to lose weight. Your doctor can suggest groups in your area.  Where can you learn more?  Go to https://www.bennett.info/ and enter U357 to learn more about "Starting a Weight Loss Plan: Care Instructions."  Current as of: September 20, 2023Content Version: 14.0   2006-2024 [employer].   Care instructions adapted under license by [facility]. If you have questions about a medical condition or this instruction, always ask your healthcare professional. [place] any warranty or liability for your use of this information.           A Healthy Heart: Care Instructions  Overview     Coronary artery disease, also called heart disease, occurs when a substance called plaque builds up in the vessels that supply oxygen-rich blood to your heart muscle. This can narrow the blood vessels and reduce blood flow. A heart attack happens when blood flow is completely blocked. A high-fat diet, smoking, and other factors increase the risk of heart disease.  Your doctor has found that you have a chance of having heart disease. A heart-healthy lifestyle  can help keep your heart healthy and prevent heart disease. This lifestyle includes eating healthy, being active, staying at a weight that's healthy for you, and not smoking or using tobacco. It also includes taking medicines as directed, managing other health conditions, and trying to get a healthy amount of sleep.  Follow-up care is a key part of your treatment and safety. Be sure to make and go to all appointments, and call your doctor if you are having problems. It's also a good idea to know your test results and keep a list of the medicines you take.  How can you care for yourself at home?  Diet   Use less salt when you cook and eat. This helps lower your blood pressure. Taste food before salting. Add only a little salt when you think you need it. With time, your taste buds will adjust to less salt.    Eat fewer snack items, fast foods, canned soups, and other high-salt, high-fat, processed foods.    Read food labels and try to avoid saturated and trans fats. They increase your risk of heart disease by raising cholesterol levels.    Limit the amount of solid fat--butter, margarine, and shortening--you eat. Use olive, peanut, or canola oil when you cook. Bake, broil, and steam foods instead of frying them.    Eat a variety of fruit and vegetables every day. Dark green, deep orange, red, or yellow fruits and vegetables are especially good for you. Examples include spinach, carrots, peaches, and berries.    Foods high in fiber can reduce your cholesterol and provide important vitamins and minerals. High-fiber foods include whole-grain cereals and breads, oatmeal, beans, brown rice, citrus fruits, and apples.    Eat lean proteins. Heart-healthy proteins include seafood, lean meats and poultry, eggs, beans, peas, nuts, seeds, and soy products.    Limit drinks and foods with added sugar. These include candy, desserts, and soda pop.   Heart-healthy lifestyle   If your doctor recommends it, get more exercise.  For many people, walking is a good choice. Or you may want to swim, bike, or do other activities. Bit by bit, increase the time you're active  every day. Try for at least 30 minutes on most days of the week.    Try to quit or cut back on using tobacco and other nicotine products. This includes smoking and vaping. If you need help quitting, talk to your doctor about stop-smoking programs and medicines. These can increase your chances of quitting for good. Quitting is one of the most important things you can do to protect your heart. It is never too late to quit. Try to avoid secondhand smoke too.    Stay at a weight that's healthy for you. Talk to your doctor if you need help losing weight.    Try to get 7 to 9 hours of sleep each night.    Limit alcohol to 2 drinks a day for men and 1 drink a day for women. Too much alcohol can cause health problems.    Manage other health problems such as diabetes, high blood pressure, and high cholesterol. If you think you may have a problem with alcohol or drug use, talk to your doctor.   Medicines   Take your medicines exactly as prescribed. Call your doctor if you think you are having a problem with your medicine.    If your doctor recommends aspirin, take the amount directed each day. Make sure you take aspirin and not another kind of pain reliever, such as acetaminophen (Tylenol).   When should you call for help?   Call 911 if you have symptoms of a heart attack. These may include:   Chest pain or pressure, or a strange feeling in the chest.    Sweating.    Shortness of breath.    Pain, pressure, or a strange feeling in the back, neck, jaw, or upper belly or in one or both shoulders or arms.    Lightheadedness or sudden weakness.    A fast or irregular heartbeat.   After you call 911, the operator may tell you to chew 1 adult-strength or 2 to 4 low-dose aspirin. Wait for an ambulance. Do not try to drive yourself.  Watch closely for changes in your health, and be  sure to contact your doctor if you have any problems.  Where can you learn more?  Go to [employer] and enter F075 to learn more about "A Healthy Heart: Care Instructions."  Current as of: June 24, 2023Content Version: 14.0   2006-2024 [employer], [employer].   Care instructions adapted under license by [facility]. If you have questions about a medical condition or this instruction, always ask your healthcare professional. [employer], Incorporated disclaims any warranty or liability for your use of this information.      Personalized Preventive Plan for Tonya Shaffer - 03/21/2023  Medicare offers a range of preventive health benefits. Some of the tests and screenings are paid in full while other may be subject to a deductible, co-insurance, and/or copay.    Some of these benefits include a comprehensive review of your medical history including lifestyle, illnesses that may run in your family, and various assessments and screenings as appropriate.    After reviewing your medical record and screening and assessments performed today your provider may have ordered immunizations, labs, imaging, and/or referrals for you.  A list of these orders (if applicable) as well as your Preventive Care list are included within your After Visit Summary for your review.    Other Preventive Recommendations:    A preventive eye exam performed by an eye specialist is recommended every 1-2 years to screen for  glaucoma; cataracts, macular degeneration, and other eye disorders.  A preventive dental visit is recommended every 6 months.  Try to get at least 150 minutes of exercise per week or 10,000 steps per day on a pedometer .  Order or download the FREE "Exercise & Physical Activity: Your Everyday Guide" from [employer]. Call [phone] or search [employer] online.  You need 1200-1500 mg of calcium and 1000-2000 IU of vitamin D per day. It is possible  to meet your calcium requirement with diet alone, but a vitamin D supplement is usually necessary to meet this goal.  When exposed to the sun, use a sunscreen that protects against both UVA and UVB radiation with an SPF of 30 or greater. Reapply every 2 to 3 hours or after sweating, drying off with a towel, or swimming.  Always wear a seat belt when traveling in a car. Always wear a helmet when riding a bicycle or motorcycle.

## 2023-03-21 NOTE — Assessment & Plan Note (Signed)
Well-controlled, continue current medications, medication adherence emphasized, and lifestyle modifications recommended

## 2023-03-21 NOTE — Assessment & Plan Note (Signed)
Well-controlled, continue current medications and lifestyle modifications recommended

## 2023-03-21 NOTE — Assessment & Plan Note (Signed)
Asymptomatic, continue current treatment plan and lifestyle modifications recommended

## 2023-04-18 MED ORDER — ESCITALOPRAM OXALATE 20 MG PO TABS
20 MG | ORAL_TABLET | Freq: Every day | ORAL | 1 refills | Status: DC
Start: 2023-04-18 — End: 2023-09-27

## 2023-04-18 NOTE — Telephone Encounter (Signed)
Pts daughter requesting a call back about depression medication dosage.     [phone]

## 2023-04-18 NOTE — Telephone Encounter (Signed)
Pt's daughter states that she has been taking two of the 10 mg of Lexapro instead of two 5mg  of Lexapro. She is doing better on the 20mg  of Lexapro. Per Dr. Rhina Brackett go ahead and send in 20 mg of Lexapro.

## 2023-05-01 ENCOUNTER — Encounter

## 2023-05-01 MED ORDER — ATORVASTATIN CALCIUM 40 MG PO TABS
40 MG | ORAL_TABLET | Freq: Every evening | ORAL | 1 refills | Status: AC
Start: 2023-05-01 — End: 2023-09-27

## 2023-09-13 ENCOUNTER — Encounter

## 2023-09-13 ENCOUNTER — Encounter: Admit: 2023-09-13 | Discharge: 2023-09-13 | Payer: MEDICARE | Primary: Family Medicine

## 2023-09-13 LAB — LIPID PANEL
Chol/HDL Ratio: 2 (ref 0.0–5.0)
Cholesterol, Total: 130 mg/dL (ref 0–200)
HDL: 65 mg/dL — ABNORMAL HIGH (ref 40–60)
LDL Cholesterol: 54 mg/dL (ref 0–100)
Triglycerides: 57 mg/dL (ref 0–150)
VLDL Cholesterol Calculated: 11 mg/dL (ref 6–23)

## 2023-09-13 LAB — COMPREHENSIVE METABOLIC PANEL
ALT: 13 U/L (ref 8–45)
AST: 20 U/L (ref 15–37)
Albumin/Globulin Ratio: 1.1 (ref 1.0–1.9)
Albumin: 3.9 g/dL (ref 3.2–4.6)
Alk Phosphatase: 48 U/L (ref 35–104)
Anion Gap: 11 mmol/L (ref 9–18)
BUN: 12 mg/dL (ref 8–23)
CO2: 25 mmol/L (ref 20–28)
Calcium: 9.1 mg/dL (ref 8.8–10.2)
Chloride: 102 mmol/L (ref 98–107)
Creatinine: 0.86 mg/dL (ref 0.60–1.10)
Est, Glom Filt Rate: 69 mL/min/{1.73_m2} (ref >60)
Globulin: 3.6 g/dL — ABNORMAL HIGH (ref 2.3–3.5)
Glucose: 106 mg/dL — ABNORMAL HIGH (ref 70–99)
Potassium: 4.7 mmol/L (ref 3.5–5.1)
Sodium: 138 mmol/L (ref 136–145)
Total Bilirubin: 0.5 mg/dL (ref 0.0–1.2)
Total Protein: 7.5 g/dL (ref 6.3–8.2)

## 2023-09-13 LAB — CBC WITH AUTO DIFFERENTIAL
Basophils %: 2 % (ref 0.0–2.0)
Basophils Absolute: 0.1 10*3/uL (ref 0.0–0.2)
Eosinophils %: 3 % (ref 0.5–7.8)
Eosinophils Absolute: 0.1 10*3/uL (ref 0.0–0.8)
Hematocrit: 41 % (ref 35.8–46.3)
Hemoglobin: 13.2 g/dL (ref 11.7–15.4)
Immature Granulocytes %: 0 % (ref 0.0–5.0)
Immature Granulocytes Absolute: 0 10*3/uL (ref 0.0–0.5)
Lymphocytes %: 41 % (ref 13–44)
Lymphocytes Absolute: 1.5 10*3/uL (ref 0.5–4.6)
MCH: 32.2 pg (ref 26.1–32.9)
MCHC: 32.2 g/dL (ref 31.4–35.0)
MCV: 100 fL (ref 82–102)
MPV: 11.1 fL (ref 9.4–12.3)
Monocytes %: 18 % — ABNORMAL HIGH (ref 4.0–12.0)
Monocytes Absolute: 0.6 10*3/uL (ref 0.1–1.3)
Neutrophils %: 36 % — ABNORMAL LOW (ref 43–78)
Neutrophils Absolute: 1.3 10*3/uL — ABNORMAL LOW (ref 1.7–8.2)
Platelets: 354 10*3/uL (ref 150–450)
RBC: 4.1 M/uL (ref 4.05–5.2)
RDW: 13.2 % (ref 11.9–14.6)
WBC: 3.5 10*3/uL — ABNORMAL LOW (ref 4.3–11.1)
nRBC: 0 10*3/uL (ref 0.0–0.2)

## 2023-09-13 LAB — TSH: TSH, 3rd Generation: 3.94 u[IU]/mL (ref 0.270–4.200)

## 2023-09-27 ENCOUNTER — Ambulatory Visit: Admit: 2023-09-27 | Discharge: 2023-09-27 | Payer: MEDICARE | Attending: Family Medicine | Primary: Family Medicine

## 2023-09-27 DIAGNOSIS — J4520 Mild intermittent asthma, uncomplicated: Secondary | ICD-10-CM

## 2023-09-27 MED ORDER — MONTELUKAST SODIUM 10 MG PO TABS
10 | ORAL_TABLET | Freq: Every evening | ORAL | 1 refills | 90.00000 days | Status: DC
Start: 2023-09-27 — End: 2024-05-16

## 2023-09-27 MED ORDER — ESCITALOPRAM OXALATE 20 MG PO TABS
20 | ORAL_TABLET | Freq: Every day | ORAL | 1 refills | Status: AC
Start: 2023-09-27 — End: ?

## 2023-09-27 MED ORDER — ALBUTEROL SULFATE HFA 108 (90 BASE) MCG/ACT IN AERS
108 | RESPIRATORY_TRACT | 5 refills | Status: AC | PRN
Start: 2023-09-27 — End: ?

## 2023-09-27 MED ORDER — RIVAROXABAN 20 MG PO TABS
20 MG | ORAL_TABLET | Freq: Every day | ORAL | 1 refills | Status: DC
Start: 2023-09-27 — End: 2024-05-16

## 2023-09-27 MED ORDER — ALENDRONATE SODIUM 70 MG PO TABS
70 MG | ORAL_TABLET | ORAL | 5 refills | Status: DC
Start: 2023-09-27 — End: 2024-05-16

## 2023-09-27 MED ORDER — ATORVASTATIN CALCIUM 40 MG PO TABS
40 | ORAL_TABLET | Freq: Every evening | ORAL | 1 refills | 90.00000 days | Status: DC
Start: 2023-09-27 — End: 2024-05-16

## 2023-09-27 NOTE — Progress Notes (Unsigned)
[facility] SERVICES  [facility]  [address]  [place] [address]  Dept: [phone]       Patient: Tonya Shaffer  Date of Birth: 11-25-1946  Patient Age: 77 y.o.  Patient Sex: female  MEDICAL RECORD [id number]  Visit Date: 09/27/2023    Family Practice Clinic Note    Chief Complaint   Patient presents with    Depression     Patient is well controlled and requesting refills.    Cholesterol Problem     Patient takes her medication at night, she is requesting refills.    Asthma     Patient is requesting refills    DVT      Patient is requesting refills.    Discuss Labs     Patient is here to discuss labs       HPI       Allergies   Allergen Reactions    Codeine Other (See Comments)       Current Outpatient Medications   Medication Sig Dispense Refill    atorvastatin (LIPITOR) 40 MG tablet TAKE 1 TABLET BY MOUTH EVERY EVENING 90 tablet 1    escitalopram (LEXAPRO) 20 MG tablet Take 1 tablet by mouth daily 90 tablet 1    albuterol sulfate HFA (PROVENTIL;VENTOLIN;PROAIR) 108 (90 Base) MCG/ACT inhaler Inhale 2 puffs into the lungs every 4 hours as needed for Shortness of Breath 18 g 5    alendronate (FOSAMAX) 70 MG tablet Take 1 tablet by mouth every 7 days 4 tablet 5    montelukast (SINGULAIR) 10 MG tablet Take 1 tablet by mouth nightly 90 tablet 1    rivaroxaban (XARELTO) 20 MG TABS tablet Take 1 tablet by mouth daily (with breakfast) TAKE 1 TABLET BY MOUTH DAILY WITH DINNER 90 tablet 1    calcium carb-cholecalciferol 250-125 MG-UNIT TABS Take 1 tablet by mouth daily      budesonide (PULMICORT) 90 MCG/ACT AEPB inhaler Inhale 2 puffs into the lungs 2 times daily (Patient not taking: Reported on 03/21/2023)       No current facility-administered medications for this visit.        Patient Active Problem List   Diagnosis    Mixed hyperlipidemia    Mild intermittent asthma without complication    Protein C deficiency (HCC)    Impaired fasting glucose    Age-related osteoporosis without  current pathological fracture    Osteopetrosis    History of DVT (deep vein thrombosis)    Nonspecific abnormal electrocardiogram (ECG) (EKG)    Chronic renal disease, stage III (HCC) [403474]    Major depressive disorder, recurrent, moderate       Family History   Problem Relation Age of Onset    Diabetes Mother     Cancer Father     Asthma Father     Heart Disease Father     Diabetes Sister     Cancer Brother     Breast Cancer Neg Hx         Past Surgical History:   Procedure Laterality Date    CHOLECYSTECTOMY      GYN      Removed tubes    PR UNLISTED PROCEDURE ABDOMEN PERITONEUM & OMENTUM      VASCULAR SURGERY      Left leg striped the vein        Review of Systems     Vitals:    09/27/23 1328   BP: 130/63  Pulse: 80   Temp: 97.8 F (36.6 C)   SpO2: 98%        Physical Exam     ASSESSMENT AND PLAN    1. Mild intermittent asthma without complication  The following orders have not been finalized:  -     montelukast (SINGULAIR) 10 MG tablet  -     albuterol sulfate HFA (PROVENTIL;VENTOLIN;PROAIR) 108 (90 Base) MCG/ACT inhaler  2. Age-related osteoporosis without current pathological fracture  The following orders have not been finalized:  -     alendronate (FOSAMAX) 70 MG tablet  3. Mixed hyperlipidemia  The following orders have not been finalized:  -     atorvastatin (LIPITOR) 40 MG tablet  4. Protein C deficiency (HCC)  The following orders have not been finalized:  -     rivaroxaban (XARELTO) 20 MG TABS tablet       ***    I have reviewed the patient's past medical history, social history and family history and vitals.  We have discussed treatment plan and follow up and given patient instructions.  Patient's questions are answered and we will follow up as indicated.    No follow-ups on file.     Junius Roads, MD    ADDITIONAL EDUCATION    Last 7 days of labs:    No visits with results within 1 Week(s) from this visit.   Latest known visit with results is:   Orders Only on 09/13/2023   Component Date Value  Ref Range Status    TSH, 3rd Generation 09/13/2023 3.940  0.270 - 4.200 uIU/mL Final    Sodium 09/13/2023 138  136 - 145 mmol/L Final    Potassium 09/13/2023 4.7  3.5 - 5.1 mmol/L Final    Chloride 09/13/2023 102  98 - 107 mmol/L Final    CO2 09/13/2023 25  20 - 28 mmol/L Final    Anion Gap 09/13/2023 11  9 - 18 mmol/L Final    Glucose 09/13/2023 106 (H)  70 - 99 mg/dL Final    Comment: <16 mg/dL Consistent with, but not fully diagnostic of hypoglycemia.  100 - 125 mg/dL Impaired fasting glucose/consistent with pre-diabetes mellitus.  > 126 mg/dl Fasting glucose consistent with overt diabetes mellitus      BUN 09/13/2023 12  8 - 23 MG/DL Final    Creatinine 10/96/0454 0.86  0.60 - 1.10 MG/DL Final    Est, Glom Filt Rate 09/13/2023 69  >60 ml/min/1.37m2 Final    Comment:   Pediatric calculator link: https://www.kidney.org/professionals/kdoqi/gfr_calculatorped    These results are not intended for use in patients <27 years of age.    eGFR results are calculated without a race factor using  the 2021 CKD-EPI equation. Careful clinical correlation is recommended, particularly when comparing to results calculated using previous equations.  The CKD-EPI equation is less accurate in patients with extremes of muscle mass, extra-renal metabolism of creatinine, excessive creatine ingestion, or following therapy that affects renal tubular secretion.      Calcium 09/13/2023 9.1  8.8 - 10.2 MG/DL Final    Total Bilirubin 09/13/2023 0.5  0.0 - 1.2 MG/DL Final    ALT 09/81/1914 13  8 - 45 U/L Final    AST 09/13/2023 20  15 - 37 U/L Final    Alk Phosphatase 09/13/2023 48  35 - 104 U/L Final    Total Protein 09/13/2023 7.5  6.3 - 8.2 g/dL Final    Albumin 78/29/5621 3.9  3.2 - 4.6 g/dL Final  Globulin 09/13/2023 3.6 (H)  2.3 - 3.5 g/dL Final    Albumin/Globulin Ratio 09/13/2023 1.1  1.0 - 1.9   Final    WBC 09/13/2023 3.5 (L)  4.3 - 11.1 K/uL Final    RBC 09/13/2023 4.10  4.05 - 5.2 M/uL Final    Hemoglobin 09/13/2023 13.2  11.7 -  15.4 g/dL Final    Hematocrit 16/08/9603 41.0  35.8 - 46.3 % Final    MCV 09/13/2023 100.0  82 - 102 FL Final    MCH 09/13/2023 32.2  26.1 - 32.9 PG Final    MCHC 09/13/2023 32.2  31.4 - 35.0 g/dL Final    RDW 54/07/8118 13.2  11.9 - 14.6 % Final    Platelets 09/13/2023 354  150 - 450 K/uL Final    MPV 09/13/2023 11.1  9.4 - 12.3 FL Final    nRBC 09/13/2023 0.00  0.0 - 0.2 K/uL Final    **Note: Absolute NRBC parameter is now reported with Hemogram**    Differential Type 09/13/2023 AUTOMATED    Final    Neutrophils % 09/13/2023 36 (L)  43 - 78 % Final    Lymphocytes % 09/13/2023 41  13 - 44 % Final    Monocytes % 09/13/2023 18 (H)  4.0 - 12.0 % Final    Eosinophils % 09/13/2023 3  0.5 - 7.8 % Final    Basophils % 09/13/2023 2  0.0 - 2.0 % Final    Immature Granulocytes % 09/13/2023 0  0.0 - 5.0 % Final    Neutrophils Absolute 09/13/2023 1.3 (L)  1.7 - 8.2 K/UL Final    Lymphocytes Absolute 09/13/2023 1.5  0.5 - 4.6 K/UL Final    Monocytes Absolute 09/13/2023 0.6  0.1 - 1.3 K/UL Final    Eosinophils Absolute 09/13/2023 0.1  0.0 - 0.8 K/UL Final    Basophils Absolute 09/13/2023 0.1  0.0 - 0.2 K/UL Final    Immature Granulocytes Absolute 09/13/2023 0.0  0.0 - 0.5 K/UL Final    Cholesterol, Total 09/13/2023 130  0 - 200 MG/DL Final    Comment: Borderline High: 200-239 mg/dL  High: Greater than or equal to 240 mg/dL      Triglycerides 14/78/2956 57  0 - 150 MG/DL Final    Comment: Borderline High: 150-199 mg/dL, High: 213-086 mg/dL  Very High: Greater than or equal to 500 mg/dL      HDL 57/84/6962 65 (H)  40 - 60 MG/DL Final    LDL Cholesterol 09/13/2023 54  0 - 100 MG/DL Final    Comment: Near Optimal: 100-129 mg/dL  Borderline High: 952-841, High: 160-189 mg/dL  Very High: Greater than or equal to 190 mg/dL      VLDL Cholesterol Calculated 09/13/2023 11  6 - 23 MG/DL Final    Chol/HDL Ratio 09/13/2023 2.0  0.0 - 5.0   Final       Educational documents were provided including; but, not limited to diagnosis, prognosis,  recurrent, complications, monitoring, instructions, prevention, etc.    Patient aware of all medications (prescribed and recommended). Patient verbalized understanding. Patient declined all other medications (OTC, provided by clinic and prescribed) as well as additional testing/imaging/diagnostics at this time. Patient aware of risks associated with declining treatment/recommendations and/or non-compliance with plan of care.    *Side effects, adverse effects, risks versus benefits associated with medications prescribed/recommended were discussed with the patient. Patient verbalized understanding. All questions answered.    *Patient was encouraged to return to the clinic and/or PCP. Or  seek emergent care if worsening signs and symptoms warrant immediate evaluation including, but not limited to HA, blurred vision, facial asymmetry, speech disturbance, difficulty with ambulation/gait, numbness, tingling, weakness, syncope, chest pain (with or without radiation), left arm pain, jaw pain, changes in hearing (loss), fever, unexplained sweating, malaise/fatigue, difficulty swallowing, mental changes (confusion, AMS), lightheadedness/dizziness, difficulty breathing, or shortness of breath.    I have reviewed the patient's medication list, past medical, family, social, and surgical history in detail and updated the patient record appropriately.    I have reviewed the patient's vital signs and discussed risks associated with any abnormal vital signs (during visit and following this visit) as well as appropriate parameters with the patient. Patient verbalized understanding. Patient agreed to seek emergent care if vital signs are above or below the parameters discussed.     Explanatory note: Be assured that the information provided to create your medical record comes from your provider. The written transcription portion of this note is prepared electronically by voice-recognition software. At times, there may be some errors in  capitalization, punctuation, tense, or context that are inherent in the system, but your provider reviews the note for content and to ensure that the note contains appropriate information for your continuing care.

## 2023-10-19 NOTE — Telephone Encounter (Signed)
 Patient called to let Dr. Royann Shivers know that her husband Tonya Shaffer passed away 03/06/2024.

## 2023-11-06 ENCOUNTER — Encounter: Admit: 2023-11-06 | Admitting: Family Medicine

## 2023-11-06 DIAGNOSIS — J4520 Mild intermittent asthma, uncomplicated: Secondary | ICD-10-CM

## 2023-11-14 ENCOUNTER — Encounter
Admit: 2023-11-14 | Discharge: 2023-11-14 | Payer: Medicare (Managed Care) | Attending: Family Medicine | Admitting: Family Medicine | Primary: Family Medicine

## 2023-11-14 DIAGNOSIS — J069 Acute upper respiratory infection, unspecified: Principal | ICD-10-CM

## 2023-11-14 MED ORDER — BENZONATATE 100 MG PO CAPS
100 | ORAL_CAPSULE | Freq: Three times a day (TID) | ORAL | 0 refills | Status: AC | PRN
Start: 2023-11-14 — End: 2023-11-24

## 2023-11-14 MED ORDER — AZITHROMYCIN 250 MG PO TABS
250 | ORAL_TABLET | ORAL | 0 refills | Status: AC
Start: 2023-11-14 — End: 2023-11-19

## 2023-11-14 NOTE — Progress Notes (Signed)
 Tonya Shaffer (1946/02/17) initiated an Science writer through Evisit.    HPI: per patient questionnaire     Exam: not applicable    Diagnoses and all orders for this visit:  Diagnoses and all orders for this visit:    Acute URI  -     azi

## 2023-12-04 MED ORDER — ESCITALOPRAM OXALATE 20 MG PO TABS
20 MG | ORAL_TABLET | Freq: Every day | ORAL | 1 refills | Status: DC
Start: 2023-12-04 — End: 2024-05-16

## 2023-12-12 ENCOUNTER — Emergency Department: Admit: 2023-12-12 | Payer: MEDICARE | Primary: Family Medicine

## 2023-12-12 ENCOUNTER — Inpatient Hospital Stay
Admit: 2023-12-12 | Discharge: 2023-12-12 | Disposition: A | Discharge status: Home / Self Care | Payer: MEDICARE | Attending: Emergency Medicine

## 2023-12-12 DIAGNOSIS — M7989 Other specified soft tissue disorders: Secondary | ICD-10-CM

## 2023-12-12 NOTE — ED Triage Notes (Signed)
Patient arrives to ED pov from home. Patient reports pain and swelling to left leg. Patient reports history of blood clot in her left leg. Patient reports she is on xarelto.

## 2023-12-12 NOTE — ED Notes (Signed)
Patient mobility status  with no difficulty.     I have reviewed discharge instructions with the patient.  The caregiver verbalized understanding.    Patient left ED via Discharge Method: ambulatory to Home with Guardian.    Opportunity for questions and clarification provided.     Patient given 0 scripts.           Geraldine Contras, RN  12/12/23 1719

## 2023-12-12 NOTE — ED Provider Notes (Signed)
Emergency Department Provider Note       PCP: Miles Costain, MD   Age: 78 y.o.   Sex: female     DISPOSITION Decision To Discharge 12/12/2023 04:46:44 PM   DISPOSITION CONDITION Stable            ICD-10-CM    1. Left leg swelling  M79.89           Medical Decision Making     I will get an ultrasound to rule out occult DVT    Ultrasound is negative I will discharge her home     1 acute complicated illness or injury.  Shared medical decision making was utilized in creating the patients health plan today.    I independently ordered and reviewed each unique test.       I interpreted the Ultrasound  no obvious noncompressible veins.              History     78 year old lady presents with concerns about pain and some swelling in her left lower leg that she is concerned might be a DVT.  She says her primary care doctor told her to come to the ER for evaluation of it.  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  She said she had a DVT for the first time in 1966 then a second DVT when her granddaughter was a baby which is approximately 23 years ago.    She says that when the weather changes or gets cold she usually gets pain in her leg.  She said this time the pain seem to be associated with a little swelling and that was what concerned her.    She denies any chest pain or shortness of breath.  No fevers or chills and no nausea, vomiting, or diarrhea.    Elements of this note were created using speech recognition software.  As such, errors of speech recognition may be present.        ROS     Review of Systems   Constitutional:  Negative for chills and fever.   Respiratory:  Negative for cough and shortness of breath.    Cardiovascular:  Positive for leg swelling.   Gastrointestinal:  Negative for nausea and vomiting.   Musculoskeletal:  Negative for arthralgias and myalgias.   Skin:  Negative for color change.        Physical Exam     Vitals signs and nursing note  reviewed:  Vitals:    12/12/23 1407 12/12/23 1409   BP: 114/79    Pulse: 80    Resp: 16    Temp: 97.7 F (36.5 C)    SpO2: 96%    Weight:  76.2 kg (168 lb)   Height:  1.549 m (5\' 1" )      Physical Exam  Constitutional:       Appearance: Normal appearance.   Musculoskeletal:      Comments: Both lower extremities appear to have 1+ edema.  I do not see any significant difference in size between the left and the right lower extremity.  No ligamentous instability in her left knee or ankle.  No tenderness to the joints.   Neurological:      Mental Status: She is alert.        Procedures     Procedures    Orders Placed This Encounter   Procedures    Vascular duplex lower extremity venous left  Medications given during this emergency department visit:  Medications - No data to display    Discharge Medication List as of 12/12/2023  5:12 PM           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        Past Surgical History:   Procedure Laterality Date    CHOLECYSTECTOMY      GYN      Removed tubes    PR UNLISTED PROCEDURE ABDOMEN PERITONEUM & OMENTUM      VASCULAR SURGERY      Left leg striped the vein        Social History     Socioeconomic History    Marital status: Married   Tobacco Use    Smoking status: Never    Smokeless tobacco: Never   Substance and Sexual Activity    Alcohol use: No    Drug use: No     Social Determinants of Health     Financial Resource Strain: Low Risk  (03/17/2022)    Overall Financial Resource Strain (CARDIA)     Difficulty of Paying Living Expenses: Not very hard   Transportation Needs: Unknown (03/17/2022)     PRAPARE - Transportation     Lack of Transportation (Non-Medical): No   Physical Activity: Insufficiently Active (03/21/2023)    Exercise Vital Sign     Days of Exercise per Week: 2 days     Minutes of Exercise per Session: 20 min   Social Connections: Unknown (02/14/2020)    Received from [facility], [facility]    Social Connections     Frequency of Communication with Friends and Family: Not asked     Frequency of Social Gatherings with Friends and Family: Not asked   Intimate Partner Violence: Unknown (02/14/2020)    Received from [facility], [facility]    Intimate Partner Violence     Fear of Current or Ex-Partner: Not asked     Emotionally Abused: Not asked     Physically Abused: Not asked     Sexually Abused: Not asked   Housing Stability: Unknown (03/17/2022)    Housing Stability Vital Sign     Unstable Housing in the Last Year: No        Discharge Medication List as of 12/12/2023  5:12 PM        CONTINUE these medications which have NOT CHANGED    Details   escitalopram (LEXAPRO) 20 MG tablet TAKE 1 TABLET BY MOUTH DAILY, Disp-90 tablet, R-1Normal      albuterol sulfate HFA (PROVENTIL;VENTOLIN;PROAIR) 108 (90 Base) MCG/ACT inhaler Inhale 2 puffs into the lungs every 4 hours as needed for Shortness of Breath, Disp-18 g, R-5Normal      alendronate (FOSAMAX) 70 MG tablet Take 1 tablet by mouth every 7 days, Disp-4 tablet, R-5Normal      atorvastatin (LIPITOR) 40 MG tablet Take 1 tablet by mouth every evening, Disp-90 tablet, R-1Normal      montelukast (SINGULAIR) 10 MG tablet Take 1 tablet by mouth nightly, Disp-90 tablet, R-1Normal      rivaroxaban (XARELTO) 20 MG TABS tablet  Take 1 tablet by mouth daily (with breakfast) TAKE 1 TABLET BY MOUTH DAILY WITH DINNER, Disp-90 tablet, R-1Normal      budesonide (PULMICORT) 90 MCG/ACT AEPB inhaler Inhale 2 puffs into the lungs 2 times dailyHistorical Med      calcium carb-cholecalciferol 250-125 MG-UNIT TABS Take 1 tablet by mouth dailyHistorical Med               Results for orders placed or performed during the hospital encounter of 12/12/23   Vascular duplex lower extremity venous left    Narrative    Left lower extremity venous ultrasound    INDICATION:  Pain and swelling,    COMPARISON:  None    Doppler ultrasound of the left lower extremity was performed.    FINDINGS:  There is normal flow in the great saphenous, common femoral, femoral,  and popliteal veins. Normal compression and augmentation is demonstrated. The  proximal calf veins are also patent.      Impression    No evidence of deep venous thrombosis in the left lower extremity      Electronically signed by Lala Lund         Vascular duplex lower extremity venous left   Final Result   No evidence of deep venous thrombosis in the left lower extremity         Electronically signed by Lala Lund                   No results for input(s): "COVID19" in the last 72 hours.    Voice dictation software was used during the making of this note.  This software is not perfect and grammatical and other typographical errors may be present.  This note has not been completely proofread for errors.        Emeline Darling, MD  12/12/23 [phone]

## 2023-12-12 NOTE — Discharge Instructions (Signed)
Please return with any fevers, redness, worsening symptoms, or additional concerns.    Please follow-up with your primary care doctor for reevaluation as needed.

## 2023-12-14 NOTE — Care Coordination-Inpatient (Signed)
Ambulatory Care Coordination Note     12/14/2023 1:58 PM     Patient Current Location:  Home: [address] [facility] [address]     This patient was received as a referral from Population health report .    ACM contacted the patient by telephone. Verified name and DOB with patient as identifiers. Provided introduction to self, and explanation of the ACM role.   Patient declined care management services at this time.          ACM: Fredirick Maudlin, RN     Challenges to be reviewed by the provider   Additional needs identified to be addressed with provider No  none               Method of communication with provider: none.    Utilization: Initial Call - N/A    Care Summary Note:     Discussed ED TOC for LLE swelling which is going down some. Pt reports usually has some swelling in winter time. Denies SOB or CP. Denies questions or concerns or other CCM needs at this time.   asthma  PCP/Specialist follow up:   Future Appointments         Provider Specialty Dept Phone    03/20/2024 10:00 AM [facility] [phone]    03/27/2024 1:00 PM Miles Costain, MD Primary Care [phone]            Follow Up:   No further Ambulatory Care Management follow-up scheduled at this time.  Patient  has Ambulatory Care Manager's contact information for any further questions, concerns or needs.

## 2024-03-20 ENCOUNTER — Encounter

## 2024-03-20 ENCOUNTER — Other Ambulatory Visit: Admit: 2024-03-20 | Discharge: 2024-03-20 | Payer: Medicare (Managed Care) | Primary: Family Medicine

## 2024-03-20 DIAGNOSIS — Z131 Encounter for screening for diabetes mellitus: Secondary | ICD-10-CM

## 2024-03-21 LAB — LIPID PANEL
Chol/HDL Ratio: 2 (ref 0.0–5.0)
Cholesterol, Total: 141 mg/dL (ref 0–200)
HDL: 70 mg/dL — ABNORMAL HIGH (ref 40–60)
LDL Cholesterol: 59 mg/dL (ref 0–100)
Triglycerides: 61 mg/dL (ref 0–150)
VLDL Cholesterol Calculated: 12 mg/dL (ref 6–23)

## 2024-03-21 LAB — CBC WITH AUTO DIFFERENTIAL
Basophils %: 2.1 % — ABNORMAL HIGH (ref 0.0–2.0)
Basophils Absolute: 0.07 10*3/uL (ref 0.00–0.20)
Eosinophils %: 3 % (ref 0.5–7.8)
Eosinophils Absolute: 0.1 10*3/uL (ref 0.00–0.80)
Hematocrit: 42 % (ref 35.8–46.3)
Hemoglobin: 13.7 g/dL (ref 11.7–15.4)
Immature Granulocytes %: 0.3 % (ref 0.0–5.0)
Immature Granulocytes Absolute: 0.01 10*3/uL (ref 0.0–0.5)
Lymphocytes %: 43.4 % (ref 13.0–44.0)
Lymphocytes Absolute: 1.44 10*3/uL (ref 0.50–4.60)
MCH: 32.2 pg (ref 26.1–32.9)
MCHC: 32.6 g/dL (ref 31.4–35.0)
MCV: 98.6 FL (ref 82–102)
MPV: 11 FL (ref 9.4–12.3)
Monocytes %: 15.1 % — ABNORMAL HIGH (ref 4.0–12.0)
Monocytes Absolute: 0.5 10*3/uL (ref 0.10–1.30)
Neutrophils %: 36.1 % — ABNORMAL LOW (ref 43.0–78.0)
Neutrophils Absolute: 1.2 10*3/uL — ABNORMAL LOW (ref 1.70–8.20)
Platelets: 311 10*3/uL (ref 150–450)
RBC: 4.26 M/uL (ref 4.05–5.2)
RDW: 13.3 % (ref 11.9–14.6)
WBC: 3.3 10*3/uL — ABNORMAL LOW (ref 4.3–11.1)
nRBC: 0 10*3/uL (ref 0.0–0.2)

## 2024-03-21 LAB — COMPREHENSIVE METABOLIC PANEL
ALT: 15 U/L (ref 8–45)
AST: 20 U/L (ref 15–37)
Albumin/Globulin Ratio: 1.2 (ref 1.0–1.9)
Albumin: 3.8 g/dL (ref 3.2–4.6)
Alk Phosphatase: 45 U/L (ref 35–104)
Anion Gap: 10 mmol/L (ref 7–16)
BUN: 10 mg/dL (ref 8–23)
CO2: 27 mmol/L (ref 20–29)
Calcium: 9.5 mg/dL (ref 8.8–10.2)
Chloride: 101 mmol/L (ref 98–107)
Creatinine: 0.83 mg/dL (ref 0.60–1.10)
Est, Glom Filt Rate: 72 mL/min/{1.73_m2} (ref >60)
Globulin: 3.1 g/dL (ref 2.3–3.5)
Glucose: 98 mg/dL (ref 70–99)
Potassium: 4.8 mmol/L (ref 3.5–5.1)
Sodium: 139 mmol/L (ref 136–145)
Total Bilirubin: 0.6 mg/dL (ref 0.0–1.2)
Total Protein: 6.9 g/dL (ref 6.3–8.2)

## 2024-03-21 LAB — TSH REFLEX TO FT4: TSH w Free Thyroid if Abnormal: 2.91 u[IU]/mL (ref 0.27–4.20)

## 2024-03-21 LAB — HEMOGLOBIN A1C
Estimated Avg Glucose: 114 mg/dL
Hemoglobin A1C: 5.6 % (ref 0–5.6)

## 2024-03-21 LAB — VITAMIN D 25 HYDROXY: Vit D, 25-Hydroxy: 30.1 ng/mL (ref 30.0–100.0)

## 2024-03-27 ENCOUNTER — Ambulatory Visit
Admit: 2024-03-27 | Discharge: 2024-03-27 | Payer: Medicare (Managed Care) | Attending: Family Medicine | Primary: Family Medicine

## 2024-03-27 VITALS — BP 130/75 | HR 82 | Temp 98.20000°F | Ht 60.98 in | Wt 161.0 lb

## 2024-03-27 DIAGNOSIS — Z0000 Encounter for general adult medical examination without abnormal findings: Secondary | ICD-10-CM

## 2024-03-27 NOTE — Assessment & Plan Note (Signed)
 Chronic, at goal (stable), continue current treatment plan and medication adherence emphasized

## 2024-03-27 NOTE — Patient Instructions (Signed)
 Learning About Being Active as an Older Adult  Why is being active important as you get older?     Being active is one of the best things you can do for your health. And it's never too late to start. Being active--or getting active, if you aren't already--has definite benefits. It can:  Give you more energy,  Keep your mind sharp.  Improve balance to reduce your risk of falls.  Help you manage chronic illness with fewer medicines.  No matter how old you are, how fit you are, or what health problems you have, there is a form of activity that will work for you. And the more physical activity you can do, the better your overall health will be.  What kinds of activity can help you stay healthy?  Being more active will make your daily activities easier. Physical activity includes planned exercise and things you do in daily life. There are four types of activity:  Aerobic.  Doing aerobic activity makes your heart and lungs strong.  Includes walking, dancing, and gardening.  Aim for at least 2 hours spread throughout the week.  It improves your energy and can help you sleep better.  Muscle-strengthening.  This type of activity can help maintain muscle and strengthen bones.  Includes climbing stairs, using resistance bands, and lifting or carrying heavy loads.  Aim for at least twice a week.  It can help protect the knees and other joints.  Stretching.  Stretching gives you better range of motion in joints and muscles.  Includes upper arm stretches, calf stretches, and gentle yoga.  Aim for at least twice a week, preferably after your muscles are warmed up from other activities.  It can help you function better in daily life.  Balancing.  This helps you stay coordinated and have good posture.  Includes heel-to-toe walking, tai chi, and certain types of yoga.  Aim for at least 3 days a week.  It can reduce your risk of falling.  Even if you have a hard time meeting the recommendations, it's better to be more active  than less active. All activity done in each category counts toward your weekly total. You'd be surprised how daily things like carrying groceries, keeping up with grandchildren, and taking the stairs can add up.  What keeps you from being active?  If you've had a hard time being more active, you're not alone. Maybe you remember being able to do more. Or maybe you've never thought of yourself as being active. It's frustrating when you can't do the things you want. Being more active can help. What's holding you back?  Getting started.  Have a goal, but break it into easy tasks. Small steps build into big accomplishments.  Staying motivated.  If you feel like skipping your activity, remember your goal. Maybe you want to move better and stay independent. Every activity gets you one step closer.  Not feeling your best.  Start with 5 minutes of an activity you enjoy. Prove to yourself you can do it. As you get comfortable, increase your time.  You may not be where you want to be. But you're in the process of getting there. Everyone starts somewhere.  How can you find safe ways to stay active?  Talk with your doctor about any physical challenges you're facing. Make a plan with your doctor if you have a health problem or aren't sure how to get started with activity.  If you're already active, ask your doctor if  there is anything you should change to stay safe as your body and health change.  If you tend to feel dizzy after you take medicine, avoid activity at that time. Try being active before you take your medicine. This will reduce your risk of falls.  If you plan to be active at home, make sure to clear your space before you get started. Remove things like TV cords, coffee tables, and throw rugs. It's safest to have plenty of space to move freely.  The key to getting more active is to take it slow and steady. Try to improve only a little bit at a time. Pick just one area to improve on at first. And if an activity hurts,  stop and talk to your doctor.  Where can you learn more?  Go to [employer] and enter P600 to learn more about "Learning About Being Active as an Older Adult."  Current as of: June 28, 2023  Content Version: 14.4   2024-2025 [place], [place].   Care instructions adapted under license by [facility]. If you have questions about a medical condition or this instruction, always ask your healthcare professional. Laren Player, [facility], disclaims any warranty or liability for your use of this information.         Learning About Dental Care for Older Adults  Dental care for older adults: Overview  Dental care for older people is much the same as for younger adults. But older adults do have concerns that younger adults do not. Older adults may have problems with gum disease and decay on the roots of their teeth. They may need missing teeth replaced or broken fillings fixed. Or they may have dentures that need to be cared for. Some older adults may have trouble holding a toothbrush.  You can help remind the person you are caring for to brush and floss their teeth or to clean their dentures. In some cases, you may need to do the brushing and other dental care tasks. People who have trouble using their hands or who have dementia may need this extra help.  How can you help with dental care?  Normal dental care  To keep the teeth and gums healthy:  Brush the teeth with fluoride toothpaste twice a day--in the morning and at night--and floss at least once a day. Plaque can quickly build up on the teeth of older adults.  Watch for the signs of gum disease. These signs include gums that bleed after brushing or after eating hard foods, such as apples.  See a dentist regularly. Many experts recommend checkups every 6 months.  Keep the dentist up to date on any new medications the person is taking.  Encourage a balanced diet that includes whole grains, vegetables, and fruits, and that is low in  saturated fat and sodium.  Encourage the person you're caring for not to use tobacco products. They can affect dental and general health.  Many older adults have a fixed income and feel that they can't afford dental care. But most towns and cities have programs in which dentists help older adults by lowering fees. Contact your area's public health offices or social services for information about dental care in your area.  Using a toothbrush  Older adults with arthritis sometimes have trouble brushing their teeth because they can't easily hold the toothbrush. Their hands and fingers may be stiff, painful, or weak. If this is the case, you can:  Offer an Mining engineer toothbrush.  Enlarge the handle of  a non-electric toothbrush by wrapping a sponge, an elastic bandage, or adhesive tape around it.  Push the toothbrush handle through a ball made of rubber or soft foam.  Make the handle longer and thicker by taping Popsicle sticks or tongue depressors to it.  You may also be able to buy special toothbrushes, toothpaste dispensers, and floss holders.  Your doctor may recommend a soft-bristle toothbrush if the person you care for bleeds easily. Bleeding can happen because of a health problem or from certain medicines.  A toothpaste for sensitive teeth may help if the person you care for has sensitive teeth.  How do you brush and floss someone's teeth?  If the person you are caring for has a hard time cleaning their teeth on their own, you may need to brush and floss their teeth for them. It may be easiest to have the person sit and face away from you, and to sit or stand behind them. That way you can steady their head against your arm as you reach around to floss and brush their teeth. Choose a place that has good lighting and is comfortable for both of you.  Before you begin, gather your supplies. You will need gloves, floss, a toothbrush, and a container to hold water if you are not near a sink. Wash and dry your hands well  and put on gloves. Start by flossing:  Gently work a piece of floss between each of the teeth toward the gums. A plastic flossing tool may make this easier, and they are available at most drugstores.  Curve the floss around each tooth into a U-shape and gently slide it under the gum line.  Move the floss firmly up and down several times to scrape off the plaque.  After you've finished flossing, throw away the used floss and begin brushing:  Wet the brush and apply toothpaste.  Place the brush at a 45-degree angle where the teeth meet the gums. Press firmly, and move the brush in small circles over the surface of the teeth.  Be careful not to brush too hard. Vigorous brushing can make the gums pull away from the teeth and can scratch the tooth enamel.  Brush all surfaces of the teeth, on the tongue side and on the cheek side. Pay special attention to the front teeth and all surfaces of the back teeth.  Brush chewing surfaces with short back-and-forth strokes.  After you've finished, help the person rinse the remaining toothpaste from their mouth.  Where can you learn more?  Go to [employer] and enter F944 to learn more about "Learning About Dental Care for Older Adults."  Current as of: June 28, 2023  Content Version: 14.4   2024-2025 [place], [place].   Care instructions adapted under license by [facility]. If you have questions about a medical condition or this instruction, always ask your healthcare professional. Laren Player, [facility], disclaims any warranty or liability for your use of this information.         Learning About Activities of Daily Living  What are activities of daily living?     Activities of daily living (ADLs) are the basic self-care tasks you do every day. These include eating, bathing, dressing, and moving around.  As you age, and if you have health problems, you may find that it's harder to do some of these tasks. If so, your doctor can suggest ideas that  may help.  To measure what kind of help you may need, your  doctor will ask how well you are able to do ADLs. Let your doctor know if there are any tasks that you are having trouble doing. This is an important first step to getting help. And when you have the help you need, you can stay as independent as possible.  How will a doctor assess your ADLs?  Asking about ADLs is part of a routine health checkup your doctor will likely do as you age. Your health check might be done in a doctor's office, in your home, or at a hospital. The goal is to find out if you are having any problems that could make it hard to care for yourself or that make it unsafe for you to be on your own.  To measure your ADLs, your doctor will ask how hard it is for you to do routine tasks. Your doctor may also want to know if you have changed the way you do a task because of a health problem. Your doctor may watch how you:  Walk back and forth.  Keep your balance while you stand or walk.  Move from sitting to standing or from a bed to a chair.  Button or unbutton a [employer] fast streamer.  Remove and put on your shoes.  It's common to feel a little worried or anxious if you find you can't do all the things you used to be able to do. Talking with your doctor about ADLs is a way to make sure you're as safe as possible and able to care for yourself as well as you can. You may want to bring a caregiver, friend, or family member to your checkup. They can help you talk to your doctor.  Follow-up care is a key part of your treatment and safety. Be sure to make and go to all appointments, and call your doctor if you are having problems. It's also a good idea to know your test results and keep a list of the medicines you take.  Current as of: September 21, 2023  Content Version: 14.4   2024-2025 [place], [place].   Care instructions adapted under license by [facility]. If you have questions about a medical condition or this instruction, always ask your  healthcare professional. Laren Player, [facility], disclaims any warranty or liability for your use of this information.         Starting a Weight-Loss Plan: Care Instructions  Overview    It can be a challenge to lose weight. But your doctor can help you make a weight-loss plan that meets your needs.  You don't have to make a lot of big changes at once. A better idea might be to focus on small changes and stick with them. When those changes become habit, you can add a few more changes.  Some people find it helpful to take an exercise or nutrition class. If you have questions, ask your doctor about seeing a registered dietitian or an exercise specialist. You might also think about joining a weight-loss support group.  If you're not ready to make changes right now, try to pick a date in the future. Then make an appointment with your doctor to talk about when and how you'll get started with a plan.  Follow-up care is a key part of your treatment and safety. Be sure to make and go to all appointments, and call your doctor if you are having problems. It's also a good idea to know your test results and keep a list of  the medicines you take.  How can you care for yourself as you start a weight-loss plan?  Set realistic goals. Many people expect to lose much more weight than is likely. A weight loss of 5% to 10% of your body weight may be enough to improve your health.  Get family and friends involved to provide support. Talk to them about why you are trying to lose weight, and ask them to help. They can help by participating in exercise and having meals with you, even if they may be eating something different.  Find what works best for you. If you do not have time or do not like to cook, a program that offers meal replacement bars or shakes may be better for you. Or if you like to prepare meals, finding a plan that includes daily menus and recipes may be best.  Ask your doctor about other health professionals who can help  you achieve your weight-loss goals.  A dietitian can help you make healthy changes in your diet.  An exercise specialist or personal trainer can help you develop a safe and effective exercise program.  A counselor or psychiatrist can help you cope with issues such as depression, anxiety, or family problems that can make it hard to focus on weight loss.  Consider joining a support group for people who are trying to lose weight. Your doctor can suggest groups in your area.  Where can you learn more?  Go to [employer] and enter U357 to learn more about "Starting a Weight-Loss Plan: Care Instructions."  Current as of: March 28, 2023  Content Version: 14.4   2024-2025 [place], [place].   Care instructions adapted under license by [facility]. If you have questions about a medical condition or this instruction, always ask your healthcare professional. Laren Player, [facility], disclaims any warranty or liability for your use of this information.         Advance Directives: Care Instructions  Overview  An advance directive is a legal way to state your wishes at the end of your life. It tells your family and your doctor what to do if you can't say what you want.  There are two main types of advance directives. You can change them any time your wishes change.  Living will.  This form tells your family and your doctor your wishes about life support and other treatment. The form is also called a declaration.  Medical power of attorney.  This form lets you name a person to make treatment decisions for you when you can't speak for yourself. This person is called a health care agent (health care proxy, health care surrogate). The form is also called a durable power of attorney for health care.  If you do not have an advance directive, decisions about your medical care may be made by a family member, or by a doctor or a judge who doesn't know you.  It may help to think of an advance directive as a  gift to the people who care for you. If you have one, they won't have to make tough decisions by themselves.  For more information, including forms for your state, see the [employer] website ([employer]cy).  Follow-up care is a key part of your treatment and safety. Be sure to make and go to all appointments, and call your doctor if you are having problems. It's also a good idea to know your test results and keep a list of the medicines you take.  What  should you include in an advance directive?  Many states have a unique advance directive form. (It may ask you to address specific issues.) Or you might use a universal form that's approved by many states.  If your form doesn't tell you what to address, it may be hard to know what to include in your advance directive. Use the questions below to help you get started.  Who do you want to make decisions about your medical care if you are not able to?  What life-support measures do you want if you have a serious illness that gets worse over time or can't be cured?  What are you most afraid of that might happen? (Maybe you're afraid of having pain, losing your independence, or being kept alive by machines.)  Where would you prefer to die? (Your home? A hospital? A nursing home?)  Do you want to donate your organs when you die?  Do you want certain religious practices performed before you die?  When should you call for help?  Be sure to contact your doctor if you have any questions.  Where can you learn more?  Go to [employer] and enter R264 to learn more about "Advance Directives: Care Instructions."  Current as of: January 27, 2023  Content Version: 14.4   2024-2025 [place], [place].   Care instructions adapted under license by [facility]. If you have questions about a medical condition or this instruction, always ask your healthcare professional. Laren Player, [facility], disclaims any warranty or liability  for your use of this information.         A Healthy Heart: Care Instructions  Overview     Coronary artery disease, also called heart disease, occurs when a substance called plaque builds up in the vessels that supply oxygen-rich blood to your heart muscle. This can narrow the blood vessels and reduce blood flow. A heart attack happens when blood flow is completely blocked. A high-fat diet, smoking, and other factors increase the risk of heart disease.  Your doctor has found that you have a chance of having heart disease. A heart-healthy lifestyle can help keep your heart healthy and prevent heart disease. This lifestyle includes eating healthy, being active, staying at a weight that's healthy for you, and not smoking or using tobacco. It also includes taking medicines as directed, managing other health conditions, and trying to get a healthy amount of sleep.  Follow-up care is a key part of your treatment and safety. Be sure to make and go to all appointments, and call your doctor if you are having problems. It's also a good idea to know your test results and keep a list of the medicines you take.  How can you care for yourself at home?  Diet    Use less salt when you cook and eat. This helps lower your blood pressure. Taste food before salting. Add only a little salt when you think you need it. With time, your taste buds will adjust to less salt.     Eat fewer snack items, fast foods, canned soups, and other high-salt, high-fat, processed foods.     Read food labels and try to avoid saturated and trans fats. They increase your risk of heart disease by raising cholesterol levels.     Limit the amount of solid fat--butter, margarine, and shortening--you eat. Use olive, peanut, or canola oil when you cook. Bake, broil, and steam foods instead of frying them.     Eat a variety of fruit  and vegetables every day. Dark green, deep orange, red, or yellow fruits and vegetables are especially good for you. Examples include  spinach, carrots, peaches, and berries.     Foods high in fiber can reduce your cholesterol and provide important vitamins and minerals. High-fiber foods include whole-grain cereals and breads, oatmeal, beans, brown rice, citrus fruits, and apples.     Eat lean proteins. Heart-healthy proteins include seafood, lean meats and poultry, eggs, beans, peas, nuts, seeds, and soy products.     Limit drinks and foods with added sugar. These include candy, desserts, and soda pop.   Heart-healthy lifestyle    If your doctor recommends it, get more exercise. For many people, walking is a good choice. Or you may want to swim, bike, or do other activities. Bit by bit, increase the time you're active every day. Try for at least 30 minutes on most days of the week.     Try to quit or cut back on using tobacco and other nicotine products. This includes smoking and vaping. If you need help quitting, talk to your doctor about stop-smoking programs and medicines. These can increase your chances of quitting for good. Quitting is one of the most important things you can do to protect your heart. It is never too late to quit. Try to avoid secondhand smoke too.     Stay at a weight that's healthy for you. Talk to your doctor if you need help losing weight.     Try to get 7 to 9 hours of sleep each night.     Limit alcohol to 2 drinks a day for men and 1 drink a day for women. Too much alcohol can cause health problems.     Manage other health problems such as diabetes, high blood pressure, and high cholesterol. If you think you may have a problem with alcohol or drug use, talk to your doctor.   Medicines    Take your medicines exactly as prescribed. Call your doctor if you think you are having a problem with your medicine.     If your doctor recommends aspirin, take the amount directed each day. Make sure you take aspirin and not another kind of pain reliever, such as acetaminophen (Tylenol).   When should you call for help?   Call 911 if  you have symptoms of a heart attack. These may include:    Chest pain or pressure, or a strange feeling in the chest.     Sweating.     Shortness of breath.     Pain, pressure, or a strange feeling in the back, neck, jaw, or upper belly or in one or both shoulders or arms.     Lightheadedness or sudden weakness.     A fast or irregular heartbeat.   After you call 911, the operator may tell you to chew 1 adult-strength or 2 to 4 low-dose aspirin. Wait for an ambulance. Do not try to drive yourself.  Watch closely for changes in your health, and be sure to contact your doctor if you have any problems.  Where can you learn more?  Go to [employer] and enter F075 to learn more about "A Healthy Heart: Care Instructions."  Current as of: June 28, 2023  Content Version: 14.4   2024-2025 [place], [place].   Care instructions adapted under license by [facility]. If you have questions about a medical condition or this instruction, always ask your healthcare professional. [place], [facility], disclaims any warranty  or liability for your use of this information.    Personalized Preventive Plan for Tonya Shaffer - 03/27/2024  Medicare offers a range of preventive health benefits. Some of the tests and screenings are paid in full while other may be subject to a deductible, co-insurance, and/or copay.  Some of these benefits include a comprehensive review of your medical history including lifestyle, illnesses that may run in your family, and various assessments and screenings as appropriate.  After reviewing your medical record and screening and assessments performed today your provider may have ordered immunizations, labs, imaging, and/or referrals for you.  A list of these orders (if applicable) as well as your Preventive Care list are included within your After Visit Summary for your review.

## 2024-03-27 NOTE — Assessment & Plan Note (Signed)
 Chronic, at goal (stable), continue current treatment plan

## 2024-03-27 NOTE — Progress Notes (Signed)
 Medicare Annual Wellness Visit    Tonya Shaffer is here for Medicare AWV (AWV, disc labs)    Assessment & Plan  1. Medicare annual wellness visit, subsequent  2. Mixed hyperlipidemia  3. Major depressive disorder, recurrent, moderate (HCC)  Assessment & Plan:   Chronic, at goal (stable), continue current treatment plan and medication adherence emphasized  4. Stage 3a chronic kidney disease (HCC)  Assessment & Plan:   Chronic, at goal (stable), continue current treatment plan  5. Impaired fasting glucose      1. Memory loss.  - Her memory loss is not indicative of Alzheimer's dementia, as she was able to recall three items during the Medicare wellness exam.  - The symptoms may be attributed to stress and anxiety rather than a neurodegenerative condition.  - It was explained that there are different types of memory loss and not all memory losses are dementia. It was also explained that as we get older, our schedule becomes blurred, and everything is mixed within 24 hours.  - She was reassured that her cognitive function remains intact and that she is capable of making informed decisions.    2. Sleep disturbance.  - She experiences irregular sleep patterns, often waking up multiple times during the night and feeling groggy during the day.  - This may be contributing to her overall fatigue and mood fluctuations.  - It was discussed that establishing a more consistent sleep schedule could help improve her daytime alertness and overall well-being.  - No new medications or therapies were prescribed for sleep disturbance at this time.    3. Depression.  - She is currently on escitalopram  20 mg daily, which was increased from an initial dose of 5 mg last year.  - She reports that the medication has helped with her mood, although she still experiences occasional sad days.  - It was noted that she is still grieving the loss of her husband, which occurred in November 2024.  - She was encouraged to continue her current medication  regimen and to engage in activities that keep her mind occupied and active.      Results       No follow-ups on file.     Subjective   The following acute and/or chronic problems were also addressed today:  History of Present Illness  The patient presents for evaluation of memory loss, sleep disturbance, and depression. She is accompanied by her daughter.    Memory loss is reported, but no hallucinations or repetitive speech are noted. Her daughter has observed a disruption in her daily routine, with irregular meal times and sleep patterns. Lunch is often eaten at 3:00 PM, and she experiences intermittent sleep throughout the night. A camera has been installed to monitor nighttime activities, which do not include any unusual behaviors such as walking down the street. Occasional daytime naps are taken, and fatigue and grogginess are experienced during the day. Concern is expressed about her inconsistent sleep schedule and its potential impact on overall well-being. She resides with her family and was out until 12:30 AM one night attending a ball game, which is unusual as she typically retires to bed by 8:30 PM.    She has been on escitalopram  20 mg for an extended period, initially prescribed at a lower dose last year for depression. Some days are more challenging than others, with occasional episodes of sadness. Despite these fluctuations, the medication appears to have a positive effect on her mood. Her husband passed away  in 09/2023, and she is still grappling with grief. The family encourages engagement in activities outside the home to distract her from grief and maintain mental health.    Patient's complete Health Risk Assessment and screening values have been reviewed and are found in Flowsheets. The following problems were reviewed today and where indicated follow up appointments were made and/or referrals ordered.    Positive Risk Factor Screenings with Interventions:              Inactivity:  On average,  how many days per week do you engage in moderate to strenuous exercise (like a brisk walk)?: 0 days (!) Abnormal  On average, how many minutes do you engage in exercise at this level?: 0 min  Interventions:  Patient advised to follow up in the office for further evaluation and treatment     Abnormal BMI (obese):  Body mass index is 30.44 kg/m. (!) Abnormal  Interventions:  low carbohydrate diet, exercise for at least 150 minutes/week        Dentist Screen:  Have you seen the dentist within the past year?: (!) No    Intervention:  Advised to schedule with their dentist       ADL's:   Patient reports needing help with:  Select all that apply: (!) Shopping, Banking/Finances, Transportation  Interventions:  Patient declined any further interventions or treatment    Advanced Directives:  Do you have a Living Will?: (!) No    Intervention:  see ACP note                     Objective   Vitals:    03/27/24 1339   BP: 130/75   BP Site: Left Upper Arm   Patient Position: Sitting   Pulse: 82   Temp: 98.2 F (36.8 C)   TempSrc: Temporal   SpO2: 96%   Weight: 73 kg (161 lb)   Height: 1.549 m (5' 0.98")      Body mass index is 30.44 kg/m.      Physical Exam  Neurological: Awake, alert, oriented x4, no focal deficit              Allergies   Allergen Reactions    Codeine Other (See Comments)     Prior to Visit Medications    Medication Sig Taking? Authorizing Provider   escitalopram  (LEXAPRO ) 20 MG tablet TAKE 1 TABLET BY MOUTH DAILY Yes Antar Milks, Lavonda Pour, MD   albuterol  sulfate HFA (PROVENTIL ;VENTOLIN ;PROAIR ) 108 (90 Base) MCG/ACT inhaler Inhale 2 puffs into the lungs every 4 hours as needed for Shortness of Breath Yes Gared Gillie, Lavonda Pour, MD   alendronate  (FOSAMAX ) 70 MG tablet Take 1 tablet by mouth every 7 days Yes Jazalyn Mondor, Lavonda Pour, MD   atorvastatin  (LIPITOR) 40 MG tablet Take 1 tablet by mouth every evening Yes Darnelle Derrick, Lavonda Pour, MD   montelukast  (SINGULAIR ) 10 MG tablet Take 1 tablet by  mouth nightly Yes Deejay Koppelman, Lavonda Pour, MD   rivaroxaban  (XARELTO ) 20 MG TABS tablet Take 1 tablet by mouth daily (with breakfast) TAKE 1 TABLET BY MOUTH DAILY WITH DINNER Yes Teddie Curd, Lavonda Pour, MD   calcium  carb-cholecalciferol 250-125 MG-UNIT TABS Take 1 tablet by mouth daily Yes Automatic Reconciliation, Ar   budesonide (PULMICORT) 90 MCG/ACT AEPB inhaler Inhale 2 puffs into the lungs 2 times daily  Patient not taking: Reported on 03/27/2024  Automatic Reconciliation, Ar       CareTeam (Including outside providers/suppliers regularly involved in providing care):  Patient Care Team:  Kandra Orn, MD as PCP - General  Atlas Kuc, Lavonda Pour, MD as PCP - Empaneled Provider     Recommendations for Preventive Services Due: see orders and patient instructions/AVS.  Recommended screening schedule for the next 5-10 years is provided to the patient in written form: see Patient Instructions/AVS.     Reviewed and updated this visit:  Tobacco  Allergies  Meds  Problems  Med Hx  Surg Hx  Fam Hx  Sexual   Hx                  The patient (or guardian, if applicable) and other individuals in attendance with the patient were advised that Artificial Intelligence will be utilized during this visit to record and process the conversation to generate a clinical note. The patient (or guardian, if applicable) and other individuals in attendance at the appointment consented to the use of AI, including the recording.

## 2024-04-10 ENCOUNTER — Ambulatory Visit: Admit: 2024-04-10 | Discharge: 2024-04-10 | Payer: Medicare (Managed Care) | Primary: Family Medicine

## 2024-04-10 VITALS — BP 118/60 | HR 79 | Temp 97.90000°F | Ht 60.0 in | Wt 160.2 lb

## 2024-04-10 DIAGNOSIS — R051 Acute cough: Secondary | ICD-10-CM

## 2024-04-10 LAB — AMB POC COVID-19 COV: SARS-COV-2 RNA, POC: NEGATIVE

## 2024-04-10 LAB — AMB POC RAPID INFLUENZA TEST
Influenza A Antigen, POC: NEGATIVE
Influenza B Antigen, POC: NEGATIVE

## 2024-04-10 LAB — AMB POC RAPID STREP A: Group A Strep Antigen, POC: NEGATIVE

## 2024-04-10 NOTE — Progress Notes (Signed)
 [facility] SERVICES  [facility]  [address]  [place] [address]  Dept: [phone]       Patient: Tonya Shaffer  Date of Birth: May 02, 1946  Patient Age: 78 y.o.  Patient Sex: female  MEDICAL RECORD [id number]  Visit Date: 04/10/2024    Family Practice Clinic Note    Chief Complaint   Patient presents with   . Cough       1. Pt presents today for cold symptoms-     Cold Symptoms   This is a new (started 5/12) problem. The current episode started in the past 7 days (started monday , was exposed to someone with URI symptoms). The problem has been unchanged. There has been no fever. Associated symptoms include congestion, coughing, ear pain, headaches, rhinorrhea and a sore throat. Pertinent negatives include no abdominal pain, chest pain, diarrhea, dysuria, joint pain, joint swelling, nausea, neck pain, plugged ear sensation, rash, sinus pain, sneezing, swollen glands, vomiting or wheezing. She has tried nothing for the symptoms.        Allergies   Allergen Reactions   . Codeine Other (See Comments)       Current Outpatient Medications   Medication Sig Dispense Refill   . escitalopram  (LEXAPRO ) 20 MG tablet TAKE 1 TABLET BY MOUTH DAILY 90 tablet 1   . albuterol  sulfate HFA (PROVENTIL ;VENTOLIN ;PROAIR ) 108 (90 Base) MCG/ACT inhaler Inhale 2 puffs into the lungs every 4 hours as needed for Shortness of Breath 18 g 5   . alendronate  (FOSAMAX ) 70 MG tablet Take 1 tablet by mouth every 7 days 4 tablet 5   . atorvastatin  (LIPITOR) 40 MG tablet Take 1 tablet by mouth every evening 90 tablet 1   . montelukast  (SINGULAIR ) 10 MG tablet Take 1 tablet by mouth nightly 90 tablet 1   . rivaroxaban  (XARELTO ) 20 MG TABS tablet Take 1 tablet by mouth daily (with breakfast) TAKE 1 TABLET BY MOUTH DAILY WITH DINNER 90 tablet 1   . calcium  carb-cholecalciferol 250-125 MG-UNIT TABS Take 1 tablet by mouth daily     . budesonide (PULMICORT) 90 MCG/ACT AEPB inhaler Inhale 2 puffs into the lungs 2 times  daily (Patient not taking: Reported on 03/21/2023)       No current facility-administered medications for this visit.        Patient Active Problem List   Diagnosis   . Mixed hyperlipidemia   . Mild intermittent asthma without complication   . Protein C deficiency   . Impaired fasting glucose   . Age-related osteoporosis without current pathological fracture   . Osteopetrosis   . History of DVT (deep vein thrombosis)   . Nonspecific abnormal electrocardiogram (ECG) (EKG)   . Chronic renal disease, stage III (HCC) [643329]   . Major depressive disorder, recurrent, moderate       Family History   Problem Relation Age of Onset   . Diabetes Mother    . Cancer Father    . Asthma Father    . Heart Disease Father    . Diabetes Sister    . Cancer Brother    . Breast Cancer Neg Hx         Past Surgical History:   Procedure Laterality Date   . CHOLECYSTECTOMY     . GYN      Removed tubes   . PR UNLISTED PROCEDURE ABDOMEN PERITONEUM & OMENTUM     . VASCULAR SURGERY      Left leg  striped the vein        Review of Systems   HENT:  Positive for congestion, ear pain, rhinorrhea and sore throat. Negative for nosebleeds, postnasal drip, sinus pressure, sinus pain and sneezing.    Respiratory:  Positive for cough. Negative for apnea, choking, chest tightness, shortness of breath, wheezing and stridor.    Cardiovascular:  Negative for chest pain, palpitations and leg swelling.   Gastrointestinal:  Negative for abdominal pain, diarrhea, nausea and vomiting.   Genitourinary:  Negative for dysuria.   Musculoskeletal:  Negative for joint pain and neck pain.   Skin:  Negative for rash.   Neurological:  Positive for headaches.   All other systems reviewed and are negative.       Vitals:    04/10/24 0852   BP: 118/60   Pulse: 79   Temp: 97.9 F (36.6 C)   SpO2: 97%        Physical Exam  Vitals and nursing note reviewed.   Constitutional:       General: She is not in acute distress.     Appearance: Normal appearance. She is not ill-appearing,  toxic-appearing or diaphoretic.   HENT:      Head: Normocephalic and atraumatic.      Right Ear: Tympanic membrane, ear canal and external ear normal. There is impacted cerumen.      Left Ear: Tympanic membrane, ear canal and external ear normal.      Ears:      Comments: Tried to wash right ear but unable to remove wax     Nose: Nose normal.      Mouth/Throat:      Mouth: Mucous membranes are moist.   Eyes:      General: No scleral icterus.     Conjunctiva/sclera: Conjunctivae normal.   Neck:      Vascular: No carotid bruit.   Cardiovascular:      Rate and Rhythm: Normal rate and regular rhythm.      Pulses: Normal pulses.      Heart sounds: Normal heart sounds. No murmur heard.  Pulmonary:      Effort: Pulmonary effort is normal.      Breath sounds: Normal breath sounds.   Abdominal:      General: Bowel sounds are normal. There is no distension.      Palpations: Abdomen is soft. There is no mass.      Tenderness: There is no abdominal tenderness. There is no right CVA tenderness, left CVA tenderness, guarding or rebound.      Hernia: No hernia is present.   Musculoskeletal:         General: Normal range of motion.      Cervical back: Normal range of motion and neck supple. No rigidity or tenderness.   Lymphadenopathy:      Cervical: No cervical adenopathy.   Skin:     General: Skin is warm.      Capillary Refill: Capillary refill takes less than 2 seconds.      Findings: No rash.   Neurological:      General: No focal deficit present.      Mental Status: She is alert and oriented to person, place, and time.   Psychiatric:         Mood and Affect: Mood normal.         Behavior: Behavior normal.         Thought Content: Thought content normal.  Judgment: Judgment normal.        ASSESSMENT AND PLAN    Assessment & Plan  Acute cough       Orders:  .  AMB POC COVID-19 COV  .  AMB POC RAPID INFLUENZA TEST  .  AMB POC RAPID STREP A  Her all labs were negative including COVID flu and strep, concern of allergies  advised patient to take Claritin and Flonase for few days use and side effect discussed.  We reviewed red flags signs/symptoms that would warrant a return visit/follow for reevaluation and when to seek emergent care patient and his caregiver verbalized understanding.   Patient educated regarding condition and plan discussed , side effects of medication reviewed.Patient verbalized understanding and agreed with plan.Advised if symptoms worsened or new symptom arise patient will contact office or should go to ER .All question answered.    Impacted cerumen of right ear       Orders:  .  [facility], Jereld Monas  She wanted to see ENT for wax removal as it is causing some discomfort in the ear we unable to wash her ear in the office.    I have reviewed the patient's past medical history, social history and family history and vitals.    Return if symptoms worsen or fail to improve.     Loman Risk MD    ADDITIONAL EDUCATION    Last 7 days of labs:    Office Visit on 04/10/2024   Component Date Value Ref Range Status   . SARS-COV-2 RNA, POC 04/10/2024 Negative   Final   . Valid Internal Control, POC 04/10/2024 yes   Final   . Influenza A Antigen, POC 04/10/2024 Negative  Not Detected Final   . Influenza B Antigen, POC 04/10/2024 Negative  Not Detected Final   . Valid Internal Control, POC 04/10/2024 yes   Final   . Group A Strep Antigen, POC 04/10/2024 Negative   Final       Educational documents were provided including; but, not limited to diagnosis, prognosis, recurrent, complications, monitoring, instructions, prevention, etc.    Patient aware of all medications (prescribed and recommended). Patient verbalized understanding. Patient declined all other medications (OTC, provided by clinic and prescribed) as well as additional testing/imaging/diagnostics at this time. Patient aware of risks associated with declining treatment/recommendations and/or non-compliance with plan of care.    *Side effects, adverse effects,  risks versus benefits associated with medications prescribed/recommended were discussed with the patient. Patient verbalized understanding. All questions answered.    *Patient was encouraged to return to the clinic and/or PCP. Or seek emergent care if worsening signs and symptoms warrant immediate evaluation including, but not limited to HA, blurred vision, facial asymmetry, speech disturbance, difficulty with ambulation/gait, numbness, tingling, weakness, syncope, chest pain (with or without radiation), left arm pain, jaw pain, changes in hearing (loss), fever, unexplained sweating, malaise/fatigue, difficulty swallowing, mental changes (confusion, AMS), lightheadedness/dizziness, difficulty breathing, or shortness of breath.    I have reviewed the patient's medication list, past medical, family, social, and surgical history in detail and updated the patient record appropriately.    I have reviewed the patient's vital signs and discussed risks associated with any abnormal vital signs (during visit and following this visit) as well as appropriate parameters with the patient. Patient verbalized understanding. Patient agreed to seek emergent care if vital signs are above or below the parameters discussed.     Explanatory note: Be assured that the information provided to create your  medical record comes from your provider. The written transcription portion of this note is prepared electronically by voice-recognition software. At times, there may be some errors in capitalization, punctuation, tense, or context that are inherent in the system, but your provider reviews the note for content and to ensure that the note contains appropriate information for your continuing care.

## 2024-04-13 ENCOUNTER — Encounter

## 2024-05-07 ENCOUNTER — Encounter: Payer: Medicare (Managed Care) | Attending: Otolaryngology | Primary: Family Medicine

## 2024-05-08 ENCOUNTER — Encounter

## 2024-05-08 NOTE — Telephone Encounter (Signed)
 Pt is requesting a refill of Fosamax  generic, montelukast , and atrovastatin to the [facility] - [address].  Pt has an appt in October

## 2024-05-16 ENCOUNTER — Encounter

## 2024-05-16 MED ORDER — ESCITALOPRAM OXALATE 20 MG PO TABS
20 | ORAL_TABLET | Freq: Every day | ORAL | 1 refills | Status: DC
Start: 2024-05-16 — End: 2024-09-26

## 2024-05-16 MED ORDER — MONTELUKAST SODIUM 10 MG PO TABS
10 | ORAL_TABLET | Freq: Every evening | ORAL | 1 refills | 90.00000 days | Status: DC
Start: 2024-05-16 — End: 2024-09-26

## 2024-05-16 MED ORDER — RIVAROXABAN 20 MG PO TABS
20 | ORAL_TABLET | Freq: Every day | ORAL | 1 refills | Status: DC
Start: 2024-05-16 — End: 2024-07-24

## 2024-05-16 MED ORDER — ALENDRONATE SODIUM 70 MG PO TABS
70 | ORAL_TABLET | ORAL | 5 refills | Status: DC
Start: 2024-05-16 — End: 2024-09-26

## 2024-05-16 MED ORDER — ATORVASTATIN CALCIUM 40 MG PO TABS
40 | ORAL_TABLET | Freq: Every evening | ORAL | 1 refills | 90.00000 days | Status: DC
Start: 2024-05-16 — End: 2024-09-26

## 2024-05-16 NOTE — Telephone Encounter (Signed)
 Alendronate  taken once a week and she takes it on Sunday and she is completely out.   Atorvastatin    Escitalopram   Montelukast   Xarelto       [facility] in [place]

## 2024-06-05 ENCOUNTER — Ambulatory Visit
Admit: 2024-06-05 | Discharge: 2024-06-05 | Payer: Medicare (Managed Care) | Attending: Otolaryngology | Primary: Family Medicine

## 2024-06-05 VITALS — HR 68 | Resp 16 | Ht 60.0 in | Wt 158.0 lb

## 2024-06-05 DIAGNOSIS — H9201 Otalgia, right ear: Principal | ICD-10-CM

## 2024-06-05 NOTE — Progress Notes (Signed)
 HPI:  Tonya Shaffer is a 78 y.o. female seen   Chief Complaint   Patient presents with    Cerumen Impaction     Patient presents today for L sided cerumen impaction .       History of Present Illness  77 year old female presents with otalgia and potential cerumen impaction. Reports left ear blockage sensation and hearing loss. History of ear pain treated by Dr. Joellen. No prior ear cleaning. Daughter notes high TV volume.      There has been a gradual onset of bilateral  ear fullness that has been persistent for months. The course has been increasing and has been moderate in degree. The ear fullness has been without the new onset of tinnitus, recurrent otitis media, otalgia, otorrhea or vertigo. The symptoms are described as being in both ears.     Past Medical History, Past Surgical History, Family history, Social History, and Medications were all reviewed with the patient today and updated as necessary.     Allergies   Allergen Reactions    Codeine Other (See Comments)       Patient Active Problem List   Diagnosis    Mixed hyperlipidemia    Mild intermittent asthma without complication    Protein C deficiency    Impaired fasting glucose    Age-related osteoporosis without current pathological fracture    Osteopetrosis    History of DVT (deep vein thrombosis)    Nonspecific abnormal electrocardiogram (ECG) (EKG)    Chronic renal disease, stage III (HCC) [698720]    Major depressive disorder, recurrent, moderate       Current Outpatient Medications   Medication Sig    alendronate  (FOSAMAX ) 70 MG tablet Take 1 tablet by mouth every 7 days    atorvastatin  (LIPITOR) 40 MG tablet Take 1 tablet by mouth every evening    escitalopram  (LEXAPRO ) 20 MG tablet Take 1 tablet by mouth daily    montelukast  (SINGULAIR ) 10 MG tablet Take 1 tablet by mouth nightly    rivaroxaban  (XARELTO ) 20 MG TABS tablet Take 1 tablet by mouth daily (with breakfast) TAKE 1 TABLET BY MOUTH DAILY WITH DINNER    albuterol  sulfate HFA  (PROVENTIL ;VENTOLIN ;PROAIR ) 108 (90 Base) MCG/ACT inhaler Inhale 2 puffs into the lungs every 4 hours as needed for Shortness of Breath    budesonide (PULMICORT) 90 MCG/ACT AEPB inhaler Inhale 2 puffs into the lungs 2 times daily    calcium  carb-cholecalciferol 250-125 MG-UNIT TABS Take 1 tablet by mouth daily     No current facility-administered medications for this visit.       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       Past Surgical History:   Procedure Laterality Date    CHOLECYSTECTOMY      GYN      Removed tubes    PR UNLISTED PROCEDURE ABDOMEN PERITONEUM & OMENTUM      VASCULAR SURGERY      Left leg striped the vein       Social History     Tobacco Use    Smoking status: Never  Smokeless tobacco: Never   Substance Use Topics    Alcohol use: No       Family History   Problem Relation Age of Onset    Diabetes Mother     Cancer Father     Asthma Father     Heart Disease Father     Diabetes Sister     Cancer Brother     Breast Cancer Neg Hx         ROS:    Review of Systems   Constitutional:  Negative for chills and fever.   HENT:  Negative for congestion, facial swelling, hearing loss, nosebleeds, sinus pressure, sinus pain and sneezing.    Eyes:  Negative for pain, discharge and itching.   Respiratory:  Negative for apnea, cough, choking, shortness of breath, wheezing and stridor.    Cardiovascular:  Negative for chest pain.   Gastrointestinal:  Negative for constipation, diarrhea and nausea.   Endocrine: Negative for polyuria.   Genitourinary:  Negative for difficulty urinating and flank pain.   Musculoskeletal:   Negative for arthralgias, myalgias and neck stiffness.   Skin:  Negative for rash and wound.   Allergic/Immunologic: Negative for environmental allergies.   Neurological:  Negative for dizziness, facial asymmetry and headaches.   Hematological:  Negative for adenopathy. Does not bruise/bleed easily.   Psychiatric/Behavioral:  Negative for agitation, behavioral problems and confusion.             PHYSICAL EXAM:    Pulse 68   Resp 16   Ht 1.524 m (5')   Wt 71.7 kg (158 lb)   SpO2 98%   BMI 30.86 kg/m     Physical Exam  Vitals and nursing note reviewed.   Constitutional:       Appearance: Normal appearance.   HENT:      Head: Normocephalic and atraumatic.      Right Ear: Tympanic membrane, ear canal and external ear normal. No middle ear effusion. There is impacted cerumen.      Left Ear: Tympanic membrane, ear canal and external ear normal.  No middle ear effusion. There is no impacted cerumen.      Ears:      Comments: Binocular microscopy revealed:    Bilateral EACs patent without edema erythema or lesions  Bilateral TMs intact with no perforations retractions or middle ear effusions       Nose: Nose normal. No congestion or rhinorrhea.      Mouth/Throat:      Mouth: Mucous membranes are moist.      Pharynx: Oropharynx is clear. No oropharyngeal exudate or posterior oropharyngeal erythema.   Eyes:      General: No scleral icterus.  Cardiovascular:      Rate and Rhythm: Normal rate and regular rhythm.      Heart sounds: Normal heart sounds, S1 normal and S2 normal.   Pulmonary:      Effort: Pulmonary effort is normal.   Musculoskeletal:      Cervical back: Normal range of motion and neck supple. No rigidity.   Lymphadenopathy:      Cervical: No cervical adenopathy.   Skin:     General: Skin is warm and dry.   Neurological:      Mental Status: She is alert and oriented to person, place, and time.   Psychiatric:         Mood and Affect: Mood normal.         Behavior: Behavior normal.  Binocular microscopy  exam reveals: cerumen impactions    PROCEDURE: Cerumen removal under binocular microscopy  INDICATIONS: Cerumen impaction  DESCRIPTION: After verbal consent was obtained and a timeout was performed, the otologic microscope was used to visualize both ears. There were normal appearing auricles bilaterally. There was cerumen impaction on the right. Ear cleaning performed under the scope w/ a right angle hook,alligator forceps, and frazier suctions. After cleaning, the ear canal skin was healthy and both TMs were intact w/ no perforations. Both middle ear spaces were clear w/ no effusions. The patient tolerated the procedure well and there were no complications.        ASSESSMENT and PLAN        ICD-10-CM    1. Otalgia, right  H92.01       2. Impacted cerumen of right ear  H61.21 REMOVE IMPACTED EAR WAX      3. Sensorineural hearing loss (SNHL) of both ears  H90.3           Assessment & Plan  1. Right-sided otalgia:  - Recent ear pain reported  - Examination revealed no concerns  - Cerumen impaction debrided    2. Right-sided cerumen impaction:  - Complete impaction debrided under microscopy    3. Sensorineural hearing loss:  - Progressive hearing changes reported  - Hearing test to be scheduled  - Clinic will follow up if concerns arise            Marland JINNY Pandy, DO  06/05/2024

## 2024-07-17 ENCOUNTER — Encounter: Payer: Medicare (Managed Care) | Attending: Audiologist | Primary: Family Medicine

## 2024-07-22 ENCOUNTER — Telehealth

## 2024-07-22 NOTE — Telephone Encounter (Signed)
 Pt called and said they picked up the prescription rivaroxaban  (XARELTO ) 20 MG TABS tablet and it says on the bottle to take it twice a day. She normally only takes it once a day and now the bottle is saying different. She just wanted confirmation on how she was supposed to take it.

## 2024-07-23 NOTE — Telephone Encounter (Signed)
 Informed pts daughter to inform only take once a day.

## 2024-07-23 NOTE — Telephone Encounter (Signed)
 Been on rivaroxaban  (XARELTO ) 20 MG TABS tablet   Gave her a 90 day supply. The rx she has now said to take morning and at night. She was only taking 1 at night with her evening meals. Can you help her with this

## 2024-07-23 NOTE — Telephone Encounter (Signed)
 Duplicate message

## 2024-07-24 MED ORDER — RIVAROXABAN 20 MG PO TABS
20 | ORAL_TABLET | Freq: Every day | ORAL | 1 refills | 30.00000 days | Status: DC
Start: 2024-07-24 — End: 2024-09-26

## 2024-07-24 NOTE — Addendum Note (Signed)
 Addended by: BONNI RAINELLE CROME on: 07/24/2024 07:25 AM     Modules accepted: Orders

## 2024-07-31 ENCOUNTER — Ambulatory Visit
Admit: 2024-07-31 | Discharge: 2024-07-31 | Payer: Medicare (Managed Care) | Attending: Audiologist | Primary: Family Medicine

## 2024-07-31 DIAGNOSIS — H903 Sensorineural hearing loss, bilateral: Principal | ICD-10-CM

## 2024-09-19 ENCOUNTER — Other Ambulatory Visit: Admit: 2024-09-19 | Discharge: 2024-09-19 | Payer: Medicare (Managed Care) | Primary: Family Medicine

## 2024-09-19 ENCOUNTER — Encounter

## 2024-09-19 DIAGNOSIS — E782 Mixed hyperlipidemia: Principal | ICD-10-CM

## 2024-09-19 LAB — COMPREHENSIVE METABOLIC PANEL
ALT: 15 U/L (ref 8–45)
AST: 19 U/L (ref 15–37)
Albumin/Globulin Ratio: 1.1 (ref 1.0–1.9)
Albumin: 3.9 g/dL (ref 3.2–4.6)
Alk Phosphatase: 48 U/L (ref 35–104)
Anion Gap: 9 mmol/L (ref 7–16)
BUN: 10 mg/dL (ref 8–23)
CO2: 29 mmol/L (ref 20–29)
Calcium: 9.9 mg/dL (ref 8.8–10.2)
Chloride: 101 mmol/L (ref 98–107)
Creatinine: 0.78 mg/dL (ref 0.60–1.10)
Est, Glom Filt Rate: 77 ml/min/1.73m2 (ref >60)
Globulin: 3.6 g/dL — ABNORMAL HIGH (ref 2.3–3.5)
Glucose: 107 mg/dL — ABNORMAL HIGH (ref 70–99)
Potassium: 4.5 mmol/L (ref 3.5–5.1)
Sodium: 139 mmol/L (ref 136–145)
Total Bilirubin: 0.6 mg/dL (ref 0.0–1.2)
Total Protein: 7.4 g/dL (ref 6.3–8.2)

## 2024-09-19 LAB — CBC WITH AUTO DIFFERENTIAL
Basophils %: 1.8 % (ref 0.0–2.0)
Basophils Absolute: 0.08 K/UL (ref 0.00–0.20)
Eosinophils %: 4.3 % (ref 0.5–7.8)
Eosinophils Absolute: 0.19 K/UL (ref 0.00–0.80)
Hematocrit: 44.2 % (ref 35.8–46.3)
Hemoglobin: 14.2 g/dL (ref 11.7–15.4)
Immature Granulocytes %: 0.2 % (ref 0.0–5.0)
Immature Granulocytes Absolute: 0.01 K/UL (ref 0.0–0.5)
Lymphocytes %: 35.7 % (ref 13.0–44.0)
Lymphocytes Absolute: 1.59 K/UL (ref 0.50–4.60)
MCH: 32.1 pg (ref 26.1–32.9)
MCHC: 32.1 g/dL (ref 31.4–35.0)
MCV: 100 FL (ref 82–102)
MPV: 10.8 FL (ref 9.4–12.3)
Monocytes %: 12.8 % — ABNORMAL HIGH (ref 4.0–12.0)
Monocytes Absolute: 0.57 K/UL (ref 0.10–1.30)
Neutrophils %: 45.2 % (ref 43.0–78.0)
Neutrophils Absolute: 2.01 K/UL (ref 1.70–8.20)
Platelets: 347 K/uL (ref 150–450)
RBC: 4.42 M/uL (ref 4.05–5.2)
RDW: 12.9 % (ref 11.9–14.6)
WBC: 4.5 K/uL (ref 4.3–11.1)
nRBC: 0 K/uL (ref 0.0–0.2)

## 2024-09-19 LAB — TSH: TSH, 3rd Generation: 3.99 u[IU]/mL (ref 0.270–4.200)

## 2024-09-19 LAB — LIPID PANEL
Chol/HDL Ratio: 2 (ref 0.0–5.0)
Cholesterol, Total: 144 mg/dL (ref 0–200)
HDL: 72 mg/dL — ABNORMAL HIGH (ref 40–60)
LDL Cholesterol: 56 mg/dL (ref 0–100)
Triglycerides: 82 mg/dL (ref 0–150)
VLDL Cholesterol Calculated: 16 mg/dL (ref 6–23)

## 2024-09-19 LAB — HEMOGLOBIN A1C
Estimated Avg Glucose: 112 mg/dL
Hemoglobin A1C: 5.5 % (ref 0–5.6)

## 2024-09-26 ENCOUNTER — Ambulatory Visit
Admit: 2024-09-26 | Discharge: 2024-09-26 | Payer: Medicare (Managed Care) | Attending: Family Medicine | Primary: Family Medicine

## 2024-09-26 VITALS — BP 137/84 | HR 80 | Temp 98.70000°F | Ht 60.0 in | Wt 161.2 lb

## 2024-09-26 DIAGNOSIS — E782 Mixed hyperlipidemia: Principal | ICD-10-CM

## 2024-09-26 MED ORDER — RIVAROXABAN 20 MG PO TABS
20 | ORAL_TABLET | Freq: Every day | ORAL | 1 refills | 30.00000 days | Status: AC
Start: 2024-09-26 — End: ?

## 2024-09-26 MED ORDER — MONTELUKAST SODIUM 10 MG PO TABS
10 | ORAL_TABLET | Freq: Every evening | ORAL | 1 refills | 90.00000 days | Status: AC
Start: 2024-09-26 — End: ?

## 2024-09-26 MED ORDER — ALENDRONATE SODIUM 70 MG PO TABS
70 | ORAL_TABLET | ORAL | 5 refills | 84.00000 days | Status: AC
Start: 2024-09-26 — End: ?

## 2024-09-26 MED ORDER — ATORVASTATIN CALCIUM 40 MG PO TABS
40 | ORAL_TABLET | Freq: Every evening | ORAL | 1 refills | 90.00000 days | Status: AC
Start: 2024-09-26 — End: ?

## 2024-09-26 MED ORDER — ESCITALOPRAM OXALATE 20 MG PO TABS
20 | ORAL_TABLET | Freq: Every day | ORAL | 1 refills | 90.00000 days | Status: AC
Start: 2024-09-26 — End: ?

## 2024-09-26 NOTE — Progress Notes (Signed)
 "  [facility]  [facility]  [address]  [place] [address]  Dept: [phone]       Patient: Tonya Shaffer  Date of Birth: 11-03-1946  Patient Age: 78 y.o.  Patient Sex: female  MEDICAL RECORD [id number]  Visit Date: 09/26/2024    Family Practice Clinic Note    Chief Complaint   Patient presents with    Follow-up     Pt presents today for follow up, review labs.     Medication Refill     Needs refill of all medications.        History of Present Illness  The patient is a 78 year old female who presents for evaluation of hearing loss and health maintenance.    She has been experiencing hearing loss for an extended period, which is hereditary. She has not previously used hearing aids. A recent hearing test indicated the need for a hearing aid. She underwent ear cleaning at an ENT office a few weeks ago, which resulted in improved hearing but also caused some discomfort. Additionally, she noticed a small, fluid-filled knot on her neck that resolved on its own.    She reports feeling well overall. She has never received the COVID-19 vaccine and has only had a mild case of COVID-19, during which she did not experience loss of taste or smell. She is currently taking a multivitamin supplement that includes vitamin D and calcium . She has expressed interest in receiving the influenza vaccine, as she did not receive it last year. She has a history of pneumonia but has never required hospitalization for it. She has received the pneumonia vaccine in the past.    Occupation: Works at a hospital, 12-hour shifts  Living Condition: Lives with her daughter and mother, attends church on Sundays    FAMILY HISTORY  Her father had significant hearing loss.    Allergies   Allergen Reactions    Codeine Other (See Comments)       Current Outpatient Medications   Medication Sig Dispense Refill    Omega-3 Fatty Acids (FISH OIL) 300 MG CAPS Take by mouth      Multiple Vitamins-Minerals  (THERAPEUTIC MULTIVITAMIN-MINERALS) tablet Take 1 tablet by mouth daily      alendronate  (FOSAMAX ) 70 MG tablet Take 1 tablet by mouth every 7 days 4 tablet 5    atorvastatin  (LIPITOR) 40 MG tablet Take 1 tablet by mouth every evening 90 tablet 1    escitalopram  (LEXAPRO ) 20 MG tablet Take 1 tablet by mouth daily 90 tablet 1    montelukast  (SINGULAIR ) 10 MG tablet Take 1 tablet by mouth nightly 90 tablet 1    rivaroxaban  (XARELTO ) 20 MG TABS tablet Take 1 tablet by mouth daily (with breakfast) 90 tablet 1    albuterol  sulfate HFA (PROVENTIL ;VENTOLIN ;PROAIR ) 108 (90 Base) MCG/ACT inhaler Inhale 2 puffs into the lungs every 4 hours as needed for Shortness of Breath 18 g 5    budesonide (PULMICORT) 90 MCG/ACT AEPB inhaler Inhale 2 puffs into the lungs 2 times daily      calcium  carb-cholecalciferol 250-125 MG-UNIT TABS Take 1 tablet by mouth daily       No current facility-administered medications for this visit.        Patient Active Problem List   Diagnosis    Mixed hyperlipidemia    Mild intermittent asthma without complication    Protein C deficiency    Impaired fasting glucose    Age-related osteoporosis without  current pathological fracture    Osteopetrosis    History of DVT (deep vein thrombosis)    Nonspecific abnormal electrocardiogram (ECG) (EKG)    Chronic renal disease, stage III (HCC) [698720]    Major depressive disorder, recurrent, moderate       Family History   Problem Relation Age of Onset    Diabetes Mother     Cancer Father     Asthma Father     Heart Disease Father     Diabetes Sister     Cancer Brother     Breast Cancer Neg Hx         Past Surgical History:   Procedure Laterality Date    CHOLECYSTECTOMY      GYN      Removed tubes    PR UNLISTED PROCEDURE ABDOMEN PERITONEUM & OMENTUM      VASCULAR SURGERY      Left leg striped the vein        Review of Systems     Vitals:    09/26/24 1307   BP: 137/84   Pulse: 80   Temp: 98.7 F (37.1 C)   SpO2: 97%        Physical Exam  General: Appears well, no  acute distress.  Ears: Irritation noted post-cleaning.  Neck: Fluid-filled knot noted, likely related to parotid gland.     Physical Exam    ASSESSMENT/PLAN:  1. Mixed hyperlipidemia  -     atorvastatin  (LIPITOR) 40 MG tablet; Take 1 tablet by mouth every evening, Disp-90 tablet, R-1Normal  2. Age-related osteoporosis without current pathological fracture  -     alendronate  (FOSAMAX ) 70 MG tablet; Take 1 tablet by mouth every 7 days, Disp-4 tablet, R-5Normal  3. Mild intermittent asthma without complication  -     montelukast  (SINGULAIR ) 10 MG tablet; Take 1 tablet by mouth nightly, Disp-90 tablet, R-1Normal  4. Impaired fasting glucose  5. Protein C deficiency  -     rivaroxaban  (XARELTO ) 20 MG TABS tablet; Take 1 tablet by mouth daily (with breakfast), Disp-90 tablet, R-1Normal      Assessment & Plan  1. Hearing loss:  - She has been struggling with hearing loss for a long time and is about to get hearing aids.  - She had her ears cleaned at the ENT's office, which caused some irritation but did not improve her hearing significantly.  - She is advised to continue with the plan to get hearing aids, as it will significantly improve her quality of life.    2. Health maintenance:  - Her laboratory results are within normal limits, indicating good overall health.  - She is currently taking a multivitamin supplement that includes vitamin D and calcium .  - The importance of maintaining a high vitamin D level to prevent severe infections was discussed.  - She is recommended to receive the influenza vaccine due to her age, although her exposure to potential sources of infection is minimal. If she decides to receive the influenza vaccine, she can contact the office or visit in person.           I have reviewed the patient's past medical history, social history and family history and vitals.  We have discussed treatment plan and follow up and given patient instructions.  Patient's questions are answered and we will follow up  as indicated.    Return in about 6 months (around 03/27/2025), or if symptoms worsen or fail to improve, for labs 1 week prior  to visit.     Rainelle Poet, MD    ADDITIONAL EDUCATION    Last 7 days of labs:    No visits with results within 1 Week(s) from this visit.   Latest known visit with results is:   Orders Only on 09/19/2024   Component Date Value Ref Range Status    Hemoglobin A1C 09/19/2024 5.5  0 - 5.6 % Final    Comment: Reference Range  Normal       <5.7%  Prediabetes  5.7-6.4%  Diabetes     >6.4%      Estimated Avg Glucose 09/19/2024 112  mg/dL Final    TSH, 3rd Generation 09/19/2024 3.990  0.270 - 4.200 uIU/mL Final    Cholesterol, Total 09/19/2024 144  0 - 200 MG/DL Final    Comment: Low: Less than or equal to 200 mg/dL  Borderline High: 798-760 mg/dL  High: Greater than or equal to 240 mg/dL      Triglycerides 89/76/7974 82  0 - 150 MG/DL Final    Comment: Borderline High: 150-199 mg/dL, High: 799-500 mg/dL  Very High: Greater than or equal to 500 mg/dL      HDL 89/76/7974 72 (H)  40 - 60 MG/DL Final    LDL Cholesterol 09/19/2024 56  0 - 100 MG/DL Final    Comment: Near Optimal: 100-129 mg/dL  Borderline High: 869-840, High: 160-189 mg/dL  Very High: Greater than or equal to 190 mg/dL  Friedewald Equation      VLDL Cholesterol Calculated 09/19/2024 16  6 - 23 MG/DL Final    Chol/HDL Ratio 09/19/2024 2.0  0.0 - 5.0   Final    Sodium 09/19/2024 139  136 - 145 mmol/L Final    Potassium 09/19/2024 4.5  3.5 - 5.1 mmol/L Final    Chloride 09/19/2024 101  98 - 107 mmol/L Final    CO2 09/19/2024 29  20 - 29 mmol/L Final    Anion Gap 09/19/2024 9  7 - 16 mmol/L Final    Glucose 09/19/2024 107 (H)  70 - 99 mg/dL Final    Comment: <29 mg/dL Consistent with, but not fully diagnostic of hypoglycemia.  100 - 125 mg/dL Impaired fasting glucose/consistent with pre-diabetes mellitus.  > 126 mg/dl Fasting glucose consistent with overt diabetes mellitus      BUN 09/19/2024 10  8 - 23 MG/DL Final    Creatinine  89/76/7974 0.78  0.60 - 1.10 MG/DL Final    Est, Glom Filt Rate 09/19/2024 77  >60 ml/min/1.7m2 Final    Comment:   Pediatric calculator link: https://www.kidney.org/professionals/kdoqi/gfr_calculatorped    These results are not intended for use in patients <24 years of age.    eGFR results are calculated without a race factor using  the 2021 CKD-EPI equation. Careful clinical correlation is recommended, particularly when comparing to results calculated using previous equations.  The CKD-EPI equation is less accurate in patients with extremes of muscle mass, extra-renal metabolism of creatinine, excessive creatine ingestion, or following therapy that affects renal tubular secretion.      Calcium  09/19/2024 9.9  8.8 - 10.2 MG/DL Final    Total Bilirubin 09/19/2024 0.6  0.0 - 1.2 MG/DL Final    ALT 89/76/7974 15  8 - 45 U/L Final    AST 09/19/2024 19  15 - 37 U/L Final    Alk Phosphatase 09/19/2024 48  35 - 104 U/L Final    Total Protein 09/19/2024 7.4  6.3 - 8.2 g/dL Final    Albumin  09/19/2024 3.9  3.2 - 4.6 g/dL Final    Globulin 89/76/7974 3.6 (H)  2.3 - 3.5 g/dL Final    Albumin/Globulin Ratio 09/19/2024 1.1  1.0 - 1.9   Final    WBC 09/19/2024 4.5  4.3 - 11.1 K/uL Final    RBC 09/19/2024 4.42  4.05 - 5.2 M/uL Final    Hemoglobin 09/19/2024 14.2  11.7 - 15.4 g/dL Final    Hematocrit 89/76/7974 44.2  35.8 - 46.3 % Final    MCV 09/19/2024 100.0  82 - 102 FL Final    MCH 09/19/2024 32.1  26.1 - 32.9 PG Final    MCHC 09/19/2024 32.1  31.4 - 35.0 g/dL Final    RDW 89/76/7974 12.9  11.9 - 14.6 % Final    Platelets 09/19/2024 347  150 - 450 K/uL Final    MPV 09/19/2024 10.8  9.4 - 12.3 FL Final    nRBC 09/19/2024 0.00  0.0 - 0.2 K/uL Final    **Note: Absolute NRBC parameter is now reported with Hemogram**    Differential Type 09/19/2024 AUTOMATED    Final    Neutrophils % 09/19/2024 45.2  43.0 - 78.0 % Final    Lymphocytes % 09/19/2024 35.7  13.0 - 44.0 % Final    Monocytes % 09/19/2024 12.8 (H)  4.0 - 12.0 % Final     Eosinophils % 09/19/2024 4.3  0.5 - 7.8 % Final    Basophils % 09/19/2024 1.8  0.0 - 2.0 % Final    Immature Granulocytes % 09/19/2024 0.2  0.0 - 5.0 % Final    Neutrophils Absolute 09/19/2024 2.01  1.70 - 8.20 K/UL Final    Lymphocytes Absolute 09/19/2024 1.59  0.50 - 4.60 K/UL Final    Monocytes Absolute 09/19/2024 0.57  0.10 - 1.30 K/UL Final    Eosinophils Absolute 09/19/2024 0.19  0.00 - 0.80 K/UL Final    Basophils Absolute 09/19/2024 0.08  0.00 - 0.20 K/UL Final    Immature Granulocytes Absolute 09/19/2024 0.01  0.0 - 0.5 K/UL Final       Educational documents were provided including; but, not limited to diagnosis, prognosis, recurrent, complications, monitoring, instructions, prevention, etc.    Patient aware of all medications (prescribed and recommended). Patient verbalized understanding. Patient declined all other medications (OTC, provided by clinic and prescribed) as well as additional testing/imaging/diagnostics at this time. Patient aware of risks associated with declining treatment/recommendations and/or non-compliance with plan of care.    *Side effects, adverse effects, risks versus benefits associated with medications prescribed/recommended were discussed with the patient. Patient verbalized understanding. All questions answered.    *Patient was encouraged to return to the clinic and/or PCP. Or seek emergent care if worsening signs and symptoms warrant immediate evaluation including, but not limited to HA, blurred vision, facial asymmetry, speech disturbance, difficulty with ambulation/gait, numbness, tingling, weakness, syncope, chest pain (with or without radiation), left arm pain, jaw pain, changes in hearing (loss), fever, unexplained sweating, malaise/fatigue, difficulty swallowing, mental changes (confusion, AMS), lightheadedness/dizziness, difficulty breathing, or shortness of breath.    I have reviewed the patient's medication list, past medical, family, social, and surgical history in  detail and updated the patient record appropriately.    I have reviewed the patient's vital signs and discussed risks associated with any abnormal vital signs (during visit and following this visit) as well as appropriate parameters with the patient. Patient verbalized understanding. Patient agreed to seek emergent care if vital signs are above or below the parameters discussed.  Explanatory note: Be assured that the information provided to create your medical record comes from your provider. The written transcription portion of this note is prepared electronically by voice-recognition software. At times, there may be some errors in capitalization, punctuation, tense, or context that are inherent in the system, but your provider reviews the note for content and to ensure that the note contains appropriate information for your continuing care.     The patient (or guardian, if applicable) and other individuals in attendance with the patient were advised that Artificial Intelligence will be utilized during this visit to record, process the conversation to generate a clinical note, and support improvement of the AI technology. The patient (or guardian, if applicable) and other individuals in attendance at the appointment consented to the use of AI, including the recording.   "

## 2024-10-09 ENCOUNTER — Encounter

## 2024-11-26 ENCOUNTER — Ambulatory Visit
Admit: 2024-11-26 | Discharge: 2024-11-26 | Payer: Medicare (Managed Care) | Attending: Otolaryngology | Primary: Family Medicine

## 2024-11-26 DIAGNOSIS — H9201 Otalgia, right ear: Principal | ICD-10-CM

## 2024-11-26 NOTE — Progress Notes (Signed)
 HPI:    Tonya Shaffer is a 78 y.o. female seen Established   Chief Complaint   Patient presents with    Lymphadenopathy     Patient presents today with c/o R sided neck swelling that is soft to the touch to continues to come and go . She has had eye pain on that side as well .        History of Present Illness  78 year old female presents for follow-up with right-sided neck swelling near right ear and occasional otalgia. Symptoms began after starting hearing aid use. Initial evaluation in 05/2024 showed cerumen impaction and SNHL, leading to hearing aid initiation.    Intermittent right-sided neck swelling, described as mumps-like, non-tender, fluid-like consistency, no palpable nodules. Swelling onset after meals, resolved by next morning. No diuretic or drying medications.    Daily ear pain since hearing aid use, no discomfort from amplified sound. Allergic to codeine.        Past Medical History, Past Surgical History, Family history, Social History, and Medications were all reviewed with the patient today and updated as necessary.     Allergies   Allergen Reactions    Codeine Other (See Comments)       Patient Active Problem List   Diagnosis    Mixed hyperlipidemia    Mild intermittent asthma without complication    Protein C deficiency    Impaired fasting glucose    Age-related osteoporosis without current pathological fracture    Osteopetrosis    History of DVT (deep vein thrombosis)    Nonspecific abnormal electrocardiogram (ECG) (EKG)    Chronic renal disease, stage III (HCC) [698720]    Major depressive disorder, recurrent, moderate       Current Outpatient Medications   Medication Sig    Omega-3 Fatty Acids (FISH OIL) 300 MG CAPS Take by mouth    Multiple Vitamins-Minerals (THERAPEUTIC MULTIVITAMIN-MINERALS) tablet Take 1 tablet by mouth daily    alendronate  (FOSAMAX ) 70 MG tablet Take 1 tablet by mouth every 7 days    atorvastatin  (LIPITOR) 40 MG tablet Take 1 tablet by mouth every evening    escitalopram   (LEXAPRO ) 20 MG tablet Take 1 tablet by mouth daily    montelukast  (SINGULAIR ) 10 MG tablet Take 1 tablet by mouth nightly    rivaroxaban  (XARELTO ) 20 MG TABS tablet Take 1 tablet by mouth daily (with breakfast)    albuterol  sulfate HFA (PROVENTIL ;VENTOLIN ;PROAIR ) 108 (90 Base) MCG/ACT inhaler Inhale 2 puffs into the lungs every 4 hours as needed for Shortness of Breath    budesonide (PULMICORT) 90 MCG/ACT AEPB inhaler Inhale 2 puffs into the lungs 2 times daily    calcium  carb-cholecalciferol 250-125 MG-UNIT TABS Take 1 tablet by mouth daily     No current facility-administered medications for this visit.       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       Past Surgical History:   Procedure Laterality Date    CHOLECYSTECTOMY      GYN      Removed tubes    PR UNLISTED PROCEDURE ABDOMEN PERITONEUM & OMENTUM  VASCULAR SURGERY      Left leg striped the vein       Social History     Tobacco Use    Smoking status: Never    Smokeless tobacco: Never   Substance Use Topics    Alcohol use: No       Family History   Problem Relation Age of Onset    Diabetes Mother     Cancer Father     Asthma Father     Heart Disease Father     Diabetes Sister     Cancer Brother     Breast Cancer Neg Hx         ROS:    Review of Systems   Constitutional:  Negative for chills and fever.   HENT:  Positive for ear pain. Negative for congestion, ear discharge, facial swelling, hearing loss, nosebleeds, sinus pressure, sinus pain and sneezing.    Eyes:  Negative for pain, discharge and itching.   Respiratory:  Negative for apnea, cough, choking, shortness of  breath, wheezing and stridor.    Cardiovascular:  Negative for chest pain.   Gastrointestinal:  Negative for abdominal pain, constipation, diarrhea and nausea.   Endocrine: Negative for polyuria.   Genitourinary:  Negative for difficulty urinating and flank pain.   Musculoskeletal:  Negative for arthralgias, myalgias, neck pain and neck stiffness.   Skin:  Negative for rash and wound.   Allergic/Immunologic: Negative for environmental allergies.   Neurological:  Negative for dizziness, facial asymmetry and headaches.   Hematological:  Negative for adenopathy. Does not bruise/bleed easily.   Psychiatric/Behavioral:  Negative for agitation, behavioral problems and confusion.            PHYSICAL EXAM:    There were no vitals taken for this visit.    Physical Exam  Vitals and nursing note reviewed.   Constitutional:       Appearance: Normal appearance.   HENT:      Head: Normocephalic and atraumatic.      Right Ear: Tympanic membrane, ear canal and external ear normal. No middle ear effusion. There is no impacted cerumen.      Left Ear: Tympanic membrane, ear canal and external ear normal.  No middle ear effusion. There is no impacted cerumen.      Ears:      Comments: Binocular microscopy revealed:    Bilateral EACs patent without edema erythema or lesions  Bilateral TMs intact with no perforations retractions or middle ear effusions       Nose: Nose normal. No congestion or rhinorrhea.      Mouth/Throat:      Mouth: Mucous membranes are moist.      Pharynx: Oropharynx is clear. No oropharyngeal exudate or posterior oropharyngeal erythema.   Eyes:      General: No scleral icterus.  Cardiovascular:      Rate and Rhythm: Normal rate and regular rhythm.      Heart sounds: Normal heart sounds, S1 normal and S2 normal.   Pulmonary:      Effort: Pulmonary effort is normal.   Musculoskeletal:      Cervical back: Normal range of motion and neck supple. No rigidity.   Lymphadenopathy:      Cervical: No cervical adenopathy.    Skin:     General: Skin is warm and dry.   Neurological:      Mental Status: She is alert and oriented to person, place, and time.   Psychiatric:  Mood and Affect: Mood normal.         Behavior: Behavior normal.              ASSESSMENT and PLAN:        ICD-10-CM    1. Otalgia, right  H92.01       2. 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 Parotid swelling  R60.9           Assessment & Plan  1. Right-sided otalgia:  - Pain peaks during daytime hearing aid use, relatively new hearing aid  - No cerumen impaction or ear pathology, otologic exam unremarkable with no otitis externa  - Recommend audiologist consultation for alternative insert for her hearing aid    2. SNHL with hearing aids:  - Hearing aids provide benefit    3. Parotid swelling:  - Intermittent right-sided facial swelling, likely parotid gland  - Swelling resolves within 24 hours, painless.  Not clinically present on today's exam  - Recommend hydration and heat application if swelling recurs  - Follow up if persistent or recurrent, consider photo documentation    Discussed risks, benefits, and alternatives:  - Conservative measures: hydration and heat for parotid swelling  - Further evaluation and possible antibiotics if swelling persists or becomes painful  - Audiologist consultation for otalgia to find comfortable ear piece insert        Marland JINNY Pandy, DO  12/07/2024      The patient (or guardian, if applicable) and other individuals in attendance with the patient were advised that Artificial Intelligence will be utilized during this visit to record, process the conversation to generate a clinical note, and support improvement of the AI technology. The patient (or guardian, if applicable) and other individuals in attendance at the appointment consented to the use of AI, including the recording.
